# Patient Record
Sex: Male | Born: 1990 | Race: Black or African American | Hispanic: No | Marital: Single | Smoking: Current every day smoker
Health system: Southern US, Community
[De-identification: ages and names within clinical notes are randomized; demographics above are authoritative.]

## PROBLEM LIST (undated history)

## (undated) ENCOUNTER — Ambulatory Visit

## (undated) DIAGNOSIS — R569 Unspecified convulsions: Secondary | ICD-10-CM

## (undated) DIAGNOSIS — F329 Major depressive disorder, single episode, unspecified: Secondary | ICD-10-CM

## (undated) DIAGNOSIS — F1210 Cannabis abuse, uncomplicated: Secondary | ICD-10-CM

## (undated) DIAGNOSIS — F419 Anxiety disorder, unspecified: Secondary | ICD-10-CM

## (undated) DIAGNOSIS — F32A Depression, unspecified: Secondary | ICD-10-CM

## (undated) DIAGNOSIS — R51 Headache: Secondary | ICD-10-CM

## (undated) DIAGNOSIS — G894 Chronic pain syndrome: Secondary | ICD-10-CM

## (undated) DIAGNOSIS — F319 Bipolar disorder, unspecified: Secondary | ICD-10-CM

## (undated) DIAGNOSIS — A539 Syphilis, unspecified: Secondary | ICD-10-CM

## (undated) DIAGNOSIS — I10 Essential (primary) hypertension: Secondary | ICD-10-CM

## (undated) DIAGNOSIS — B1920 Unspecified viral hepatitis C without hepatic coma: Secondary | ICD-10-CM

## (undated) DIAGNOSIS — A549 Gonococcal infection, unspecified: Secondary | ICD-10-CM

## (undated) DIAGNOSIS — F909 Attention-deficit hyperactivity disorder, unspecified type: Secondary | ICD-10-CM

## (undated) DIAGNOSIS — R519 Headache, unspecified: Secondary | ICD-10-CM

## (undated) DIAGNOSIS — J42 Unspecified chronic bronchitis: Secondary | ICD-10-CM

## (undated) DIAGNOSIS — J45909 Unspecified asthma, uncomplicated: Secondary | ICD-10-CM

## (undated) DIAGNOSIS — B20 Human immunodeficiency virus [HIV] disease: Secondary | ICD-10-CM

## (undated) DIAGNOSIS — K648 Other hemorrhoids: Secondary | ICD-10-CM

## (undated) DIAGNOSIS — K5289 Other specified noninfective gastroenteritis and colitis: Secondary | ICD-10-CM

## (undated) DIAGNOSIS — Z21 Asymptomatic human immunodeficiency virus [HIV] infection status: Secondary | ICD-10-CM

## (undated) DIAGNOSIS — R599 Enlarged lymph nodes, unspecified: Secondary | ICD-10-CM

## (undated) DIAGNOSIS — F445 Conversion disorder with seizures or convulsions: Secondary | ICD-10-CM

## (undated) DIAGNOSIS — G43909 Migraine, unspecified, not intractable, without status migrainosus: Secondary | ICD-10-CM

## (undated) DIAGNOSIS — B009 Herpesviral infection, unspecified: Secondary | ICD-10-CM

## (undated) DIAGNOSIS — A630 Anogenital (venereal) warts: Secondary | ICD-10-CM

## (undated) HISTORY — DX: Conversion disorder with seizures or convulsions: F44.5

## (undated) HISTORY — DX: Attention-deficit hyperactivity disorder, unspecified type: F90.9

## (undated) HISTORY — DX: Anxiety disorder, unspecified: F41.9

## (undated) HISTORY — DX: Asymptomatic human immunodeficiency virus (hiv) infection status: Z21

## (undated) HISTORY — DX: Human immunodeficiency virus (HIV) disease: B20

## (undated) HISTORY — DX: Chronic pain syndrome: G89.4

## (undated) HISTORY — DX: Cannabis abuse, uncomplicated: F12.10

## (undated) HISTORY — DX: Anogenital (venereal) warts: A63.0

## (undated) HISTORY — DX: Enlarged lymph nodes, unspecified: R59.9

## (undated) HISTORY — DX: Gonococcal infection, unspecified: A54.9

## (undated) HISTORY — DX: Unspecified convulsions: R56.9

## (undated) SURGERY — IRRIGATION AND DEBRIDEMENT EXTREMITY
Anesthesia: General | Laterality: Right

---
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
## 2011-01-01 LAB — COMPREHENSIVE METABOLIC PANEL
ALT: 14 U/L (ref 0–53)
ALT: 18 U/L (ref 0–53)
AST: 16 U/L (ref 0–37)
AST: 19 U/L (ref 0–37)
AST: 19 U/L (ref 0–37)
Albumin: 3.2 g/dL — ABNORMAL LOW (ref 3.5–5.2)
Albumin: 3.4 g/dL — ABNORMAL LOW (ref 3.5–5.2)
CO2: 28 mEq/L (ref 19–32)
CO2: 28 mEq/L (ref 19–32)
Calcium: 8.5 mg/dL (ref 8.4–10.5)
Calcium: 8.7 mg/dL (ref 8.4–10.5)
Chloride: 105 mEq/L (ref 96–112)
Creatinine, Ser: 0.91 mg/dL (ref 0.4–1.5)
Creatinine, Ser: 1.01 mg/dL (ref 0.4–1.5)
GFR calc Af Amer: >60 mL/min (ref >60)
GFR calc Af Amer: >60 mL/min (ref >60)
GFR calc Af Amer: >60 mL/min (ref >60)
GFR calc non Af Amer: >60 mL/min (ref >60)
GFR calc non Af Amer: >60 mL/min (ref >60)
Glucose, Bld: 80 mg/dL (ref 70–99)
Sodium: 140 mEq/L (ref 135–145)
Sodium: 140 mEq/L (ref 135–145)
Total Bilirubin: 0.7 mg/dL (ref 0.3–1.2)
Total Protein: 5.9 g/dL — ABNORMAL LOW (ref 6.0–8.3)

## 2011-01-01 LAB — DIFFERENTIAL
Basophils Absolute: 0 10*3/uL (ref 0.0–0.1)
Basophils Relative: 0 % (ref 0–1)
Eosinophils Absolute: 0.1 10*3/uL (ref 0.0–0.7)
Eosinophils Relative: 1 % (ref 0–5)
Eosinophils Relative: 2 % (ref 0–5)
Lymphocytes Relative: 23 % (ref 12–46)
Lymphs Abs: 1.7 10*3/uL (ref 0.7–4.0)
Neutrophils Relative %: 67 % (ref 43–77)

## 2011-01-01 LAB — CBC
HCT: 43.8 % (ref 39.0–52.0)
Hemoglobin: 13.8 g/dL (ref 13.0–17.0)
Hemoglobin: 15 g/dL (ref 13.0–17.0)
MCH: 28.7 pg (ref 26.0–34.0)
MCH: 28.7 pg (ref 26.0–34.0)
MCHC: 33.8 g/dL (ref 30.0–36.0)
MCHC: 34.1 g/dL (ref 30.0–36.0)
Platelets: 247 10*3/uL (ref 150–400)
Platelets: 266 10*3/uL (ref 150–400)
RBC: 5.22 MIL/uL (ref 4.22–5.81)
RDW: 13.9 % (ref 11.5–15.5)
RDW: 14.4 % (ref 11.5–15.5)

## 2011-01-01 LAB — URINALYSIS, ROUTINE W REFLEX MICROSCOPIC
Bilirubin Urine: NEGATIVE
Ketones, ur: NEGATIVE mg/dL
Nitrite: NEGATIVE
Protein, ur: NEGATIVE mg/dL
Urobilinogen, UA: 1 mg/dL (ref 0.0–1.0)

## 2011-01-01 LAB — TSH: TSH: 0.667 u[IU]/mL (ref 0.350–4.500)

## 2011-01-01 LAB — LIPASE, BLOOD
Lipase: 17 U/L (ref 11–59)
Lipase: 19 U/L (ref 11–59)

## 2011-01-01 LAB — FOLATE RBC: RBC Folate: 583 ng/mL (ref 180–600)

## 2011-01-05 LAB — CBC
HCT: 45.2 % (ref 36.0–49.0)
HCT: 45.2 % (ref 36.0–49.0)
Hemoglobin: 15.4 g/dL (ref 12.0–16.0)
Hemoglobin: 15.4 g/dL (ref 12.0–16.0)
MCHC: 34 g/dL (ref 31.0–37.0)
MCHC: 34.2 g/dL (ref 31.0–37.0)
MCV: 86.6 fL (ref 78.0–98.0)
MCV: 87 fL (ref 78.0–98.0)
Platelets: 248 10*3/uL (ref 150–400)
Platelets: 254 10*3/uL (ref 150–400)
RBC: 5.22 MIL/uL (ref 3.80–5.70)
RDW: 13.4 % (ref 11.4–15.5)
RDW: 13.6 % (ref 11.4–15.5)
WBC: 13.5 10*3/uL (ref 4.5–13.5)

## 2011-01-05 LAB — COMPREHENSIVE METABOLIC PANEL
ALT: 19 U/L (ref 0–53)
AST: 31 U/L (ref 0–37)
Albumin: 3.7 g/dL (ref 3.5–5.2)
Albumin: 4 g/dL (ref 3.5–5.2)
Alkaline Phosphatase: 93 U/L (ref 52–171)
BUN: 11 mg/dL (ref 6–23)
BUN: 12 mg/dL (ref 6–23)
CO2: 27 mEq/L (ref 19–32)
Calcium: 8.8 mg/dL (ref 8.4–10.5)
Calcium: 9 mg/dL (ref 8.4–10.5)
Chloride: 103 mEq/L (ref 96–112)
Creatinine, Ser: 1.59 mg/dL — ABNORMAL HIGH (ref 0.4–1.5)
Creatinine, Ser: 2.46 mg/dL — ABNORMAL HIGH (ref 0.4–1.5)
Glucose, Bld: 89 mg/dL (ref 70–99)
Glucose, Bld: 89 mg/dL (ref 70–99)
Potassium: 3.6 mEq/L (ref 3.5–5.1)
Potassium: 3.7 mEq/L (ref 3.5–5.1)
Sodium: 139 mEq/L (ref 135–145)
Total Bilirubin: 1.2 mg/dL (ref 0.3–1.2)
Total Protein: 6.8 g/dL (ref 6.0–8.3)
Total Protein: 7.4 g/dL (ref 6.0–8.3)

## 2011-01-05 LAB — URINE MICROSCOPIC-ADD ON

## 2011-01-05 LAB — URINALYSIS, ROUTINE W REFLEX MICROSCOPIC
Bilirubin Urine: NEGATIVE
Glucose, UA: NEGATIVE mg/dL
Glucose, UA: NEGATIVE mg/dL
Ketones, ur: NEGATIVE mg/dL
Leukocytes, UA: NEGATIVE
Leukocytes, UA: NEGATIVE
Nitrite: NEGATIVE
Protein, ur: 30 mg/dL — AB
Protein, ur: >300 mg/dL — AB
Specific Gravity, Urine: 1.01 (ref 1.005–1.030)
Specific Gravity, Urine: 1.01 (ref 1.005–1.030)
Urobilinogen, UA: 0.2 mg/dL (ref 0.0–1.0)
pH: 6 (ref 5.0–8.0)

## 2011-01-05 LAB — DIFFERENTIAL
Basophils Absolute: 0 10*3/uL (ref 0.0–0.1)
Basophils Relative: 0 % (ref 0–1)
Basophils Relative: 0 % (ref 0–1)
Eosinophils Absolute: 0.2 10*3/uL (ref 0.0–1.2)
Eosinophils Relative: 1 % (ref 0–5)
Lymphocytes Relative: 10 % — ABNORMAL LOW (ref 24–48)
Lymphocytes Relative: 6 % — ABNORMAL LOW (ref 24–48)
Lymphs Abs: 0.7 10*3/uL — ABNORMAL LOW (ref 1.1–4.8)
Lymphs Abs: 1.3 10*3/uL (ref 1.1–4.8)
Monocytes Absolute: 0.5 10*3/uL (ref 0.2–1.2)
Monocytes Absolute: 1.2 10*3/uL (ref 0.2–1.2)
Monocytes Relative: 4 % (ref 3–11)
Monocytes Relative: 9 % (ref 3–11)
Neutro Abs: 10.8 10*3/uL — ABNORMAL HIGH (ref 1.7–8.0)
Neutro Abs: 10.8 10*3/uL — ABNORMAL HIGH (ref 1.7–8.0)
Neutrophils Relative %: 80 % — ABNORMAL HIGH (ref 43–71)
Neutrophils Relative %: 88 % — ABNORMAL HIGH (ref 43–71)

## 2011-01-07 LAB — RAPID URINE DRUG SCREEN, HOSP PERFORMED
Amphetamines: NOT DETECTED
Barbiturates: NOT DETECTED
Benzodiazepines: NOT DETECTED
Cocaine: NOT DETECTED
Opiates: NOT DETECTED
Tetrahydrocannabinol: NOT DETECTED

## 2011-01-24 LAB — POCT I-STAT, CHEM 8
Calcium, Ion: 1.19 mmol/L (ref 1.12–1.32)
Glucose, Bld: 99 mg/dL (ref 70–99)
HCT: 47 % (ref 36.0–49.0)
Hemoglobin: 16 g/dL (ref 12.0–16.0)
Potassium: 3.8 mEq/L (ref 3.5–5.1)
TCO2: 24 mmol/L (ref 0–100)

## 2011-01-25 ENCOUNTER — Emergency Department (HOSPITAL_COMMUNITY)
Admission: EM | Admit: 2011-01-25 | Discharge: 2011-01-25 | Discharge status: Against Medical Advice | Payer: Medicaid Other | Attending: Emergency Medicine | Admitting: Emergency Medicine

## 2011-01-25 DIAGNOSIS — R21 Rash and other nonspecific skin eruption: Secondary | ICD-10-CM | POA: Insufficient documentation

## 2011-01-25 DIAGNOSIS — R109 Unspecified abdominal pain: Secondary | ICD-10-CM | POA: Insufficient documentation

## 2011-01-25 DIAGNOSIS — K5090 Crohn's disease, unspecified, without complications: Secondary | ICD-10-CM | POA: Insufficient documentation

## 2011-01-25 DIAGNOSIS — R6889 Other general symptoms and signs: Secondary | ICD-10-CM | POA: Insufficient documentation

## 2011-01-31 ENCOUNTER — Other Ambulatory Visit: Payer: Self-pay | Admitting: Nurse Practitioner

## 2011-01-31 DIAGNOSIS — K219 Gastro-esophageal reflux disease without esophagitis: Secondary | ICD-10-CM

## 2011-03-03 NOTE — Discharge Summary (Signed)
Pedro Owens, Pedro Owens NO.:  [id number]   MEDICAL RECORD NO.:  [id number]          PATIENT TYPE:  INP   LOCATION:  6116                         FACILITY:  [facility]   PHYSICIAN:  Deanna Artis. 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   PROCEDURES:  Prolonged video telemetry EEG over 36 hours duration.   HOSPITAL COURSE:  The patient was admitted with recurrent generalized  seizure-like activity and had been to the emergency room on 5 different  occasions.  He has longstanding problems with anger management and had  to be removed from regular school and placed in a special school earlier  this month.  He is followed by [facility].  He has had other somatic  problems including chronic headaches (seen previously in my office) and  chest pain, more recent finding.   The patient has had poor school performance.   In hospital, the patient with his family was jovial and seemed to be  enjoying himself watching TV and videos.  When health care workers come  into the room, he would speak only when spoken to and if at that time.   The patient had 4 push button events, all of which failed to demonstrate  evidence of seizures.  They were associated with either side-to-side  movement of his head and or rocking of his head back and forth.  In one,  he appeared to have unreactive pupils, be unresponsive to ammonia, and  to have a copious secretions that required suctioning.  This too failed  to show evidence of seizure activity.  Either abnormal waking record  could be seen at times when he was not moving or immediately before  immediately after the event.   There is no evidence of spike and slow wave discharge in the 25 hours  that I have reviewed so for.  There is another 17 hours of recording  that needs to  be evaluated, but the patient has had no further clinical  episodes since he was seen yesterday around 6 p.m.   Examination today, temperature 36.4, blood pressure 99/54, resting pulse  52, respirations 16, and oxygen saturation 99% on room air.  He was  poorly cooperative, but there is no change in his general and neurologic  examination.   PLAN:  Remainder of his EEG record will be reviewed.  He will be  discharged home on Depakote 500 mg as prescribed by his psychiatrist.  He will not be placed on any other antiepileptic medicines.  I have  spoken with his grandfather and his mother to convey my opinions.  I  will try to speak with his psychiatrist later today.  Pedro Owens is very  disturbed at this time and has said that he has dreams that someone will  kill him in his sleep.  He is reticent to talk to this with his parents  because he is afraid that it will upset them.  He needs ongoing  intensive care at [facility].  Major question is whether with this ideation,  he needs to be kept in the hospital.  I do not see him as a danger to  himself or to others at this time.  I would note, however, that he has  had episodes in the hot tub where he went underneath the water and in  the middle of a road.   Laboratory studies during this hospitalization; prolactin level was 9.3  following an episode.  This is in the normal range.  Phenytoin 18.8,  valproic acid 13.4 (this suggested starting with a low dose), hemoglobin  15.3, hematocrit 45.0, ionized calcium 1.17.  Sodium 140, potassium 4.0,  chloride 106, glucose 83, BUN 16, and creatinine 0.9.       Deanna Artis. Sharene Skeans, M.D.  Electronically Signed     [facility]/MEDQ  D:  02/22/2008  T:  02/22/2008  Job:  [id number]

## 2011-03-03 NOTE — Discharge Summary (Signed)
NAMENATHON, STEFANSKI NO.:  [id number]   MEDICAL RECORD NO.:  [id number]          PATIENT TYPE:  OBV   LOCATION:  6153                         FACILITY:  [facility]   PHYSICIAN:  Orie Rout, M.D.DATE OF BIRTH:  11/24/1990   DATE OF ADMISSION:  05/03/2008  DATE OF DISCHARGE:  05/04/2008                               DISCHARGE SUMMARY   SIGNIFICANT FINDINGS:  This is a 20 year old male who comes in with  pseudoseizure.  He has had previous hospitalizations for pseudoseizures  in the past.  His neurological exam was normal.  He had a CBC and a  complete metabolic panel done where the results were all normal.   TREATMENT:  None.   OPERATIONS AND PROCEDURES:  None.   DISCHARGE DIAGNOSIS:  Pseudoseizure.   DISCHARGE MEDICATIONS:  None.   PENDING RESULTS:  None.   FOLLOWUP:  None.   DISCHARGE WEIGHT:  Uncalculated.   DISCHARGE CONDITION:  Stable.      Jamie Brookes, MD  Electronically Signed      Orie Rout, M.D.  Electronically Signed    AS/MEDQ  D:  05/04/2008  T:  05/05/2008  Job:  [id number]

## 2011-03-03 NOTE — Procedures (Signed)
EEG NUMBER:  [id number].   CLINICAL HISTORY:  The patient is a 20 year old with new onset of  seizure activity.  Seizures were noted be generalized, lasting 2-3  minutes.  The study is being done to look for the presence of epilepsy  (780.39).   PROCEDURE:  Tracing is carried out on a 32-channel digital Cadwell  recorder reformatted into 16-channel montages with 1 devoted to EKG.  The patient was awake and drowsy during the recording.   MEDICATIONS:  Include:  1. Catapres.  2. Cerebyx.  3. Ativan.  4. Clonidine.   The International 10/20 system of lead placement was used.   DESCRIPTION OF FINDINGS:  Dominant frequency is a 9 Hz 20 microvolt  activity that is broadly distributed with superimposed under 10  microvolt frontally predominant beta range activity.  The patient  becomes drowsy with 4-5 Hz theta range activity and occasional spindle-  like activity around the central regions but does not truly drift into  natural sleep.   The patient has a 35-second episode of nonepileptic behavior during  which time he is gasping and shaking his upper body.  This causes  movement artifact of lead which as soon as the activity stops the normal  background is present.  The patient is awake and alert following the  event and is able to answer questions appropriately.   EKG showed a regular sinus rhythm with ventricular response of 60 beats  per minute.  Photic stimulation induced a driving response at 9, 11, and  13 Hz.  Hyperventilation caused little change in background.   IMPRESSION:  Normal waking record.  The patient had a 35-second  nonepileptic event associated with gasping and shaking of the body.     Deanna Artis. Sharene Skeans, M.D.  Electronically Signed    EAV:WUJW  D:  01/30/2008 11:33:52  T:  01/30/2008 12:14:54  Job #:  [id number]   cc:   Henrietta Hoover, MD

## 2011-03-03 NOTE — H&P (Signed)
Pedro Owens, Pedro Owens NO.:  [id number]   MEDICAL RECORD NO.:  [id number]          PATIENT TYPE:  INP   LOCATION:  6116                         FACILITY:  [facility]   PHYSICIAN:  Deanna Artis. Hickling, M.D.DATE OF BIRTH:  1991/09/01   DATE OF ADMISSION:  02/20/2008  DATE OF DISCHARGE:                              HISTORY & PHYSICAL   CHIEF COMPLAINT:  Seizure-like behaviors.   HISTORY OF PRESENT ILLNESS:  Rhonda is a 20 year old with explosive  onset of seizures that began in April 2009.  Over the past several  weeks, the patient has had 5 or 6 visits to the emergency room.  His  seizures have occurred in various situations such as being hot tub,  crossing a street, at school, and in the car.   After being seen in the office for consultation on Feb 17, 2008,  the  patient had an episode in the car that his mother was able to video  tape.  This showed him in a  opisthotonic posture, groaning, and  grunting.  I could not see his arms and legs but mother said that he was  jerking.  Without seeing more than the head, cannot comment on whether  not this was entirely consistent with a seizure disorder.  I did see one  hand crossed across his chest.   The patient has had a number of other problems including chest pain.  He  has a history of chronic headaches that occur twice a week, that are  dull and aching, that associate with sensitive light, sound, and  movement.  He has had problems with attention deficit disorder.  He had  a cluster of medications, Strattera and another  neurostimulant that  were discontinued when he had onset of his seizures.  He has had  Depakote recently started because of his anger.   DRUG ALLERGIES:  None.   PAST MEDICAL HISTORY:  As noted above.   REVIEW OF SYSTEMS:  Twelve-system review is negative except as noted  above in the history of present illness.   FAMILY HISTORY:  Negative for seizures, mental retardation, cerebral  palsy,  blindness, deafness, is positive for spina bifida in a cousin and  no history of autism.  His sister has depression, and bipolar affective  disorder.  Mother has depression.  Father has anger management problems.  Brother has attention deficit disorder.   SOCIAL HISTORY:  The patient is a Medical sales representative in high school.  He  switched from [facility] to [facility] because of his anger.   His medications on visit to our office were Depakote 500 mg daily, and  Ativan 0.5 mg twice daily.  First, it was started by his psychiatrist  and the second by his primary physician, Dr. Alwyn Pea.   PHYSICAL EXAMINATION:  GENERAL:  On examination today, this is a well-  developed, well-nourished young man in no acute distress.  VITAL SIGNS:  Temperature 36.7, blood pressure 110/58, resting pulse 60,  respirations 20, oxygen saturation 98%.  EAR, NOSE, AND THROAT:  No infections.  LUNGS:  Clear.  HEART:  No murmurs.  Pulses normal.  ABDOMEN:  Soft.  Bowel sounds normal.  EXTREMITIES:  Unremarkable.  NEUROLOGIC:  Examination of mental status, the patient had a flat  affect, normal language, and cognition.  Cranial nerves, round reactive  pupils.  Visual fields full.  Extraocular movements full.  Symmetric  facial strength.  Midline tongue and uvula.  Air conduction greater than  bone conduction bilaterally.  Motor examination, normal strength, tone,  and mass.  Good fine motor movements.  No pronator drift.  Sensation  intact to vibration and stereognosis.  Gait was normal.  Deep tendon  reflex is diminished.  The patient had bilateral flexor plantar  responses.   IMPRESSION:  1. Seizures versus nonepileptic seizures 780.39.  2. Affective disorder.  I do not know if he is bipolar.  3. Attention deficit disorder, mixed type.  4. Poor school performance.  5. History of chest pain.   PLAN:  24-hour video-EEG.  If his first 24 hours shows no seizures, I  will discontinue Dilantin.  I did not  restart the Depakote or the  Ativan.  We will place a saline lock, so we can give him medication if  we need to do so.  He will have a prolactin level checked if he has  seizures and will also use ammonia underneath his nose to see his  response.   I appreciate the opportunity to participate in his care.  I will review  the video-EEG on a daily basis when it comes ready.      Deanna Artis. Sharene Skeans, M.D.  Electronically Signed     [facility]/MEDQ  D:  02/21/2008  T:  02/21/2008  Job:  [id number]   cc:   Deanna Artis. Sharene Skeans, M.D.  Tanya D. Daphine Deutscher, M.D.  Dr. Elmarie Mainland

## 2011-03-03 NOTE — H&P (Signed)
NAME:  Pedro Owens, Pedro Owens NO.:  [id number]   MEDICAL RECORD NO.:  [id number]          PATIENT TYPE:  INP   LOCATION:  6151                         FACILITY:  [facility]   PHYSICIAN:  Orie Rout, M.D.DATE OF BIRTH:  [birth year]   DATE OF ADMISSION:  01/28/2008  DATE OF DISCHARGE:  01/30/2008                              HISTORY & PHYSICAL   REASON FOR HOSPITALIZATION:  The patient is a 20 year old African  American male with a history of Attention deficit disorder and anger  management issues who  presented to the ED for the second time with a  history for a new onset of seizures.  The patient had 4 witnessed  seizures in the past 24 hours.  The patient did not have any urinary  or fecal incontinence during the episodes, and there was no postictal  state.   SIGNIFICANT FINDINGS:  Physical exam showed no focal neurological deficits.  A Dilantin level  was 26.  He did have an MRI/MRA that were negative.  Urine drug screen  was negative.  Chemistry panel was within normal limits.  His CBC was  within normal limits.  EKG showed sinus bradycardia with the rate of 48.  Head CT was negative for any lesion.  His alcohol level was negative,  and the patient was afebrile during hospitalization.  He did report one  seizure-like episode during the EEG reading.   TREATMENT:  The patient was treated with fosphenytoin 20 mg/kg load 1  time.  He also was on a CR monitor and was written for Ativan p.r.n.  seizure activity.  He did not receive any Ativan.  The patient underwent  EEG, the final results are pending.   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   DISCHARGE MEDICATIONS AND INSTRUCTIONS:  The patient is to return if he  becomes unstable, if he has a fever greater than 100.4 degrees or he is  also instructed to return if he experiences decreased urine output or  loss of appetite.   PENDING RESULTS AND ISSUES TO BE FOLLOWED:  We will follow up on  his  final EEG results, and they are currently pending at this time.   FOLLOWUP:  A followup appointment with Dr. Daphine Deutscher at the [facility]  [facility] for February 06, 2008, at 3:30 p.m.   DISCHARGE WEIGHT:  69 kg.   DISCHARGE CONDITION:  Stable.   Information faxed to primary care physician, Dr. Daphine Deutscher in [facility], fax number [phone] and also fax to Dr. Elsie Saas at [facility], the patient's primary psychiatrist, fax number [phone].      Pediatrics Resident      Orie Rout, M.D.  Electronically Signed    PR/MEDQ  D:  01/30/2008  T:  01/31/2008  Job:  [id number]

## 2011-03-03 NOTE — Procedures (Signed)
EEG NUMBER:  [id number].   CLINICAL HISTORY:  The patient is a 20 year old [place] American young  man with explosive onset of seizure-like behavior that is generalized  that began in April 2009.  The patient has had a chronic headache  disorder, chest pain, and has had longstanding history of emotional  disorder followed by [facility] [employer].  The study is being done to look for  the presence of a seizure disorder (780.39).   PROCEDURE:  The tracing is carried out on a 32-channel digital Cadwell  recorder reformatted into 16-channel montages with one devoted to EKG.  The patient was awake and asleep during the recording.  The  International 10/20 system lead placement was used.  Medications include  Dilantin.  The patient had been on Depakote.  This was stopped prior to  the admission.   DESCRIPTION OF FINDINGS:  The record was carried out beginning on January 21, 2008, at 1324 hours and 27 seconds and continued until Feb 21, 2008,  at 1547 hours and 42 seconds.  This is a total of approximately 26 hours  and 18 minutes.  The record continues at this time.   During the first 18 hours and 42 minutes of recording, the patient had  no events.  Dilantin was held after the first day, although he was not  scheduled to have any doses of Dilantin until later.  Beginning at  8:26:20 and extending until 8:27:12, the patient had rhythmic side-to-  side movements of his head.  There was evidence of electrode artifact  and through it a 10 Hz, 15 microvolt alpha range activity was seen.  The  activity was present before and after the behavior.  The behavior  stopped abruptly once the patient was examined by the nurse who shined  the light in his eyes.   Second episode happened at 8:35 and 39 seconds and continued until 8:25  and 58 seconds on Feb 21, 2008.  He had rhythmic nodding of his head up  and down.  This happened singly and then rhythmically there were a  series of push-button events.  Throughout  this portion, the patient had  electrode artifact and again a normal background record before, after,  and at brief periods during.   At 8:36 and 14 seconds, the patient had the same behavior.  He responded  to ammonia wafted under his nose and immediately returning to waking  record.  At 9:19 and 39 seconds, he had brief rocking of his head again  with electrode artifact, but no change in background activity.  Finally  at 9:49:35, the patient went from waking record into head nodding.  He  had saliva that came from his mouth.  He did not respond to ammonia.  His pupils were nonreactive, but the record showed a waking record  surrounded by electrode artifact during the behavior and concluding with  a waking record with the patient appeared behaviorally unarousable.   The background showed a dominant frequency of 9 Hz, 20 microvolt  activity with frontally predominant beta range activity.  During  drowsiness, mixed frequency of theta and delta range activity was seen.  During sleep, vertex sharp waves and symmetric and synchronous sleep  spindles and during sleep high-voltage delta range activity.   There was no focal slowing.  There was no interictal epileptiform  activity in the form of spikes or sharp waves.   EKG showed regular sinus rhythm with ventricular response of 72 beats  per minute.  IMPRESSION:  This record demonstrates at least four episodes of behavior  with a normal background either seen during or immediately before and  after the activity.  This is consistent wih psychogenic non-epileptic  seizures.      Deanna Artis. Sharene Skeans, M.D.  Electronically Signed     [id number]  D:  02/21/2008 21:09:35  T:  02/22/2008 03:55:22  Job #:  [id number]

## 2011-03-03 NOTE — Procedures (Signed)
EEG NUMBER:  [id number].   This is the third part of 3 records that were carried out between Feb 21, 2008 and Feb 22, 2008, covering a 16-hour 52 minute 51 second period  ending at 8:33:32.   CLINICAL HISTORY:  A 20 year old with history of seizures versus  nonepileptic seizures.  Prior to this, there were 4 events that were  nonepileptic in nature. (780.39)   PROCEDURE:  The tracing is carried out on a 32-channel digital Cadwell  recorder reformatted into 16-channel montages with one devoted to EKG.  The patient was awake during the recording,  drowsy and asleep.  The  International 10-20 system of lead placement was used.  He takes no  medications.   DESCRIPTION OF FINDINGS:  Dominant frequency is 9-10 Hz, 25-30 mcV  activity with background of 110 mcV beta range activity.  The patient  shows drowsiness with mixed frequency theta and delta range activity and  drifts into natural sleep with vertex sharp waves, symmetric, and  synchronous sleep spindles in the delta range background.  He drifts  into deep sleep with predominately polymorphic delta range activity  without spindles.  The patient has both eye blink and chewing artifact.   The patient had a series of 4 or 5 discrete behaviors beginning at  7:36:20 on Feb 22, 2008.  He had rocking movements of his head side-to-  side intermittently.  This created artifact but normal background could  be seen in leads on the left side of the brain.  The patient was lying  on the right side of his head.  Normal background could also be seen  when he would briefly stop.  This continued until 7:39 and then stopped.  He then had brief episodes at 7:40, increasing activity at 7:41, 7:42  and had both rocking and nodding movements and thrusting of his body.  Around 7:43:40 this stopped.  He had a normal waking background even  though he appeared to be asleep.  There was some saliva that came from  his mouth, which was suctioned.   At 8:18:20, he  had a 40-second episode again of rolling of his head side-  to-side and at 8:22:40, less than a 10-second episode of rocking of his  body with some thrusting and finally an episode at 8:29:30 to 8:29:10  with rocking and rolling of his head from side-to-side and thrusting of  his body.  All of these were associated with normal background before  and after and some normal background that could seen during.   IMPRESSION:  This is a normal EEG in the waking state, drowsiness, and  natural sleep.  The multiple episodes seen are behaviors that do not  show any correlation with disturbance of brain background other than  lead artifact.  They are consistent with psychogenic nonepileptic  seizures.      Deanna Artis. Sharene Skeans, M.D.  Electronically Signed     ZOX:[id number]  D:  02/23/2008 12:30:48  T:  02/24/2008 04:42:04  Job #:  [id number]

## 2011-03-18 ENCOUNTER — Other Ambulatory Visit: Payer: Self-pay | Admitting: Nurse Practitioner

## 2011-03-19 ENCOUNTER — Other Ambulatory Visit: Payer: Self-pay | Admitting: *Deleted

## 2011-03-19 ENCOUNTER — Other Ambulatory Visit: Payer: Self-pay | Admitting: Nurse Practitioner

## 2011-03-19 NOTE — Telephone Encounter (Signed)
I had spoken to the pharmacist today to advise we are not approving the refill on the prednisone.  The pt does need to make an appointment with Dr. Lina Sar first. She can discuss the medication situation.

## 2011-03-19 NOTE — Telephone Encounter (Signed)
I called the [facility] pharmacy and advised them we are denying the pt the Prednisone.  He is to make an appointment to see Dr. Lina Sar first, before we approve more prednisone.

## 2011-03-24 ENCOUNTER — Other Ambulatory Visit: Payer: Self-pay | Admitting: *Deleted

## 2011-03-24 NOTE — Telephone Encounter (Signed)
Pharmacy sent request for Prednisone.  Per Gunnar Fusi, the patient needs to call and make appointment with Dr. Juanda Chance.

## 2011-04-16 ENCOUNTER — Emergency Department (HOSPITAL_COMMUNITY)
Admission: EM | Admit: 2011-04-16 | Discharge: 2011-04-16 | Discharge status: Against Medical Advice | Payer: Medicaid Other | Attending: Emergency Medicine | Admitting: Emergency Medicine

## 2011-04-16 DIAGNOSIS — R21 Rash and other nonspecific skin eruption: Secondary | ICD-10-CM | POA: Insufficient documentation

## 2011-04-17 ENCOUNTER — Emergency Department (HOSPITAL_COMMUNITY)
Admission: EM | Admit: 2011-04-17 | Discharge: 2011-04-17 | Disposition: A | Discharge status: Home / Self Care | Payer: Medicaid Other | Attending: Emergency Medicine | Admitting: Emergency Medicine

## 2011-04-17 DIAGNOSIS — K5090 Crohn's disease, unspecified, without complications: Secondary | ICD-10-CM | POA: Insufficient documentation

## 2011-04-17 DIAGNOSIS — G8929 Other chronic pain: Secondary | ICD-10-CM | POA: Insufficient documentation

## 2011-04-17 DIAGNOSIS — R21 Rash and other nonspecific skin eruption: Secondary | ICD-10-CM | POA: Insufficient documentation

## 2011-04-17 DIAGNOSIS — R109 Unspecified abdominal pain: Secondary | ICD-10-CM | POA: Insufficient documentation

## 2011-04-22 ENCOUNTER — Other Ambulatory Visit: Payer: Self-pay | Admitting: Internal Medicine

## 2011-04-30 ENCOUNTER — Ambulatory Visit: Payer: Medicaid Other | Admitting: Internal Medicine

## 2011-07-14 LAB — POCT I-STAT, CHEM 8
BUN: 18
BUN: 19
Calcium, Ion: 1.17
Calcium, Ion: 1.24
Calcium, Ion: 1.15
Chloride: 104
Chloride: 105
Chloride: 106
Chloride: 107
Creatinine, Ser: 1.2
Creatinine, Ser: 0.9
Creatinine, Ser: 0.9
Glucose, Bld: 84
Glucose, Bld: 98
Glucose, Bld: 110 — ABNORMAL HIGH
Glucose, Bld: 83
HCT: 44
HCT: 45 — ABNORMAL HIGH
Hemoglobin: 15 — ABNORMAL HIGH
Potassium: 3.4 — ABNORMAL LOW
Potassium: 3.8
Potassium: 3.6
Potassium: 4
Sodium: 140
TCO2: 24

## 2011-07-14 LAB — COMPREHENSIVE METABOLIC PANEL
ALT: 24
AST: 29
Albumin: 3.8
Calcium: 9.1
Creatinine, Ser: 0.92
Sodium: 139
Total Protein: 6.4

## 2011-07-14 LAB — POCT I-STAT 3, ART BLOOD GAS (G3+)
Acid-Base Excess: 1
Bicarbonate: 24.8 — ABNORMAL HIGH
Operator id: 257001
pCO2 arterial: 36.7
pO2, Arterial: 161 — ABNORMAL HIGH

## 2011-07-14 LAB — DIFFERENTIAL
Basophils Absolute: 0
Basophils Absolute: 0
Basophils Relative: 0
Eosinophils Absolute: 0.1
Eosinophils Relative: 2
Lymphocytes Relative: 21 — ABNORMAL LOW
Lymphocytes Relative: 30 — ABNORMAL LOW
Lymphs Abs: 2.3
Monocytes Absolute: 0.7
Monocytes Absolute: 0.8
Monocytes Relative: 8
Monocytes Relative: 9
Neutro Abs: 4.6
Neutro Abs: 7
Neutrophils Relative %: 59
Neutrophils Relative %: 71 — ABNORMAL HIGH

## 2011-07-14 LAB — RAPID URINE DRUG SCREEN, HOSP PERFORMED
Amphetamines: NOT DETECTED
Amphetamines: NOT DETECTED
Barbiturates: NOT DETECTED
Barbiturates: NOT DETECTED
Benzodiazepines: NOT DETECTED
Benzodiazepines: POSITIVE — AB
Cocaine: NOT DETECTED
Opiates: NOT DETECTED
Tetrahydrocannabinol: NOT DETECTED

## 2011-07-14 LAB — CBC
HCT: 41.7
Hemoglobin: 14.1
Hemoglobin: 14.6
MCHC: 33.9
MCV: 83.8
Platelets: 236
RBC: 4.98
RBC: 5.07
RDW: 14.2
WBC: 7.7
WBC: 9.9

## 2011-07-14 LAB — COMPREHENSIVE METABOLIC PANEL WITH GFR
Alkaline Phosphatase: 157
BUN: 16
CO2: 25
Chloride: 109
Glucose, Bld: 87
Potassium: 3.8
Total Bilirubin: 0.7

## 2011-07-14 LAB — PHENYTOIN LEVEL, FREE AND TOTAL: Phenytoin, Free: 1.96 (ref 1.00–2.00)

## 2011-07-14 LAB — ETHANOL
Alcohol, Ethyl (B): <5
Alcohol, Ethyl (B): <5

## 2011-07-15 LAB — COMPREHENSIVE METABOLIC PANEL
ALT: 22
BUN: 13
CO2: 24
Calcium: 8.7
Creatinine, Ser: 0.86
Glucose, Bld: 111 — ABNORMAL HIGH

## 2011-07-15 LAB — CBC
HCT: 42
Hemoglobin: 14.4
MCHC: 34.3
MCV: 83.7
Platelets: 260
RBC: 5.02
RDW: 14
WBC: 8.8

## 2011-07-15 LAB — URINALYSIS, ROUTINE W REFLEX MICROSCOPIC
Bilirubin Urine: NEGATIVE
Hgb urine dipstick: NEGATIVE
Protein, ur: NEGATIVE
Specific Gravity, Urine: 1.027
Urobilinogen, UA: 1

## 2011-07-15 LAB — DIFFERENTIAL
Basophils Absolute: 0
Basophils Relative: 0
Eosinophils Absolute: 0.1
Eosinophils Relative: 1
Monocytes Absolute: 0.6
Monocytes Relative: 7
Neutro Abs: 6.2

## 2011-07-15 LAB — RAPID URINE DRUG SCREEN, HOSP PERFORMED
Amphetamines: NOT DETECTED
Amphetamines: NOT DETECTED
Cocaine: NOT DETECTED
Opiates: NOT DETECTED
Tetrahydrocannabinol: NOT DETECTED

## 2011-07-15 LAB — ETHANOL

## 2011-07-16 LAB — URINALYSIS, ROUTINE W REFLEX MICROSCOPIC
Glucose, UA: NEGATIVE
Hgb urine dipstick: NEGATIVE
Ketones, ur: NEGATIVE
Nitrite: NEGATIVE
Protein, ur: NEGATIVE
Specific Gravity, Urine: 1.035 — ABNORMAL HIGH
Urobilinogen, UA: 1
pH: 6

## 2011-07-16 LAB — URINE MICROSCOPIC-ADD ON

## 2011-07-16 LAB — RAPID URINE DRUG SCREEN, HOSP PERFORMED
Amphetamines: NOT DETECTED
Barbiturates: NOT DETECTED
Benzodiazepines: POSITIVE — AB
Cocaine: NOT DETECTED
Opiates: NOT DETECTED
Tetrahydrocannabinol: NOT DETECTED

## 2011-07-17 LAB — RAPID URINE DRUG SCREEN, HOSP PERFORMED
Amphetamines: NOT DETECTED
Barbiturates: NOT DETECTED
Benzodiazepines: NOT DETECTED
Cocaine: NOT DETECTED
Opiates: NOT DETECTED
Tetrahydrocannabinol: NOT DETECTED
Tetrahydrocannabinol: NOT DETECTED

## 2011-07-17 LAB — POCT I-STAT, CHEM 8
BUN: 20
Calcium, Ion: 1.12
Calcium, Ion: 1.27
Chloride: 107
Creatinine, Ser: 1.1
Glucose, Bld: 104 — ABNORMAL HIGH
Glucose, Bld: 88
HCT: 45 — ABNORMAL HIGH
HCT: 47
Hemoglobin: 15.3 — ABNORMAL HIGH
Potassium: 3.2 — ABNORMAL LOW
Potassium: 3.9
Sodium: 140
TCO2: 26
TCO2: 30

## 2011-08-11 ENCOUNTER — Inpatient Hospital Stay (INDEPENDENT_AMBULATORY_CARE_PROVIDER_SITE_OTHER)
Admission: RE | Admit: 2011-08-11 | Discharge: 2011-08-11 | Disposition: A | Discharge status: Home / Self Care | Payer: Self-pay | Source: Ambulatory Visit | Attending: Family Medicine | Admitting: Family Medicine

## 2011-08-11 DIAGNOSIS — B078 Other viral warts: Secondary | ICD-10-CM

## 2011-08-25 ENCOUNTER — Encounter (INDEPENDENT_AMBULATORY_CARE_PROVIDER_SITE_OTHER): Payer: Self-pay | Admitting: General Surgery

## 2011-08-25 ENCOUNTER — Ambulatory Visit (INDEPENDENT_AMBULATORY_CARE_PROVIDER_SITE_OTHER): Payer: Medicaid Other | Admitting: General Surgery

## 2011-08-25 DIAGNOSIS — K648 Other hemorrhoids: Secondary | ICD-10-CM | POA: Insufficient documentation

## 2011-08-25 DIAGNOSIS — A630 Anogenital (venereal) warts: Secondary | ICD-10-CM

## 2011-08-25 HISTORY — DX: Other hemorrhoids: K64.8

## 2011-08-25 MED ORDER — IMIQUIMOD 5 % EX CREA: TOPICAL_CREAM | CUTANEOUS | Status: DC

## 2011-08-25 MED ORDER — HYDROCORTISONE 2.5 % RE CREA
TOPICAL_CREAM | RECTAL | Status: DC
Start: 2011-08-25 — End: 2011-09-08

## 2011-08-25 NOTE — Assessment & Plan Note (Signed)
Aldara to warts.   May need fulguration if aldara unsuccessful.

## 2011-08-25 NOTE — Progress Notes (Signed)
Chief Complaint  Patient presents with  . Other    urgent- eval anal pain; rectal bleeding    HISTORY: Pt with 6 months history of perianal bleeding and soreness.  Occasionally feels like something comes out of his bottom.  Denies difficulty with cleaning.  Despite having Crohn's, he is having some issues with constipation and prolonged time on the toilet.  He is otherwise doing well.  He was diagnosed with crohn's in his youth with abdominal pain and bloody diarrhea.    Past Medical History  Diagnosis Date  . Ileitis   . ADHD (attention deficit hyperactivity disorder)   . Lymphoid hyperplasia, reactive   . Anxiety   . Marijuana abuse   . Crohn's disease     Past Surgical History  Procedure Date  . Foot surgery     bilateral  . Ankle surgery     bilateral    Current Outpatient Prescriptions  Medication Sig Dispense Refill  . hydrocortisone (ANUSOL-HC) 2.5 % rectal cream Apply TID after aldara finished.  30 g  0  . imiquimod (ALDARA) 5 % cream Apply to affected area three times weekly  12 each  1     Allergies  Allergen Reactions  . Hydromorphone Hcl     REACTION: rash     Family History  Problem Relation Age of Onset  . Colon cancer Neg Hx      History   Social History  . Marital Status: Single    Spouse Name: N/A    Number of Children: N/A  . Years of Education: N/A   Social History Main Topics  . Smoking status: Current Everyday Smoker -- 1.5 packs/day  . Smokeless tobacco: None  . Alcohol Use: Yes  . Drug Use: No  . Sexually Active: None   Other Topics Concern  . None   Social History Narrative  . None     REVIEW OF SYSTEMS - PERTINENT POSITIVES ONLY: 12 point review of systems negative other than HPI and PMH.  EXAM: Filed Vitals:   08/25/11 1648  BP: 150/104  Pulse: 52  Temp: 97.9 F (36.6 C)  Resp: 16    Gen:  No acute distress.  Well nourished and well groomed.   Neurological: Alert and oriented to person, place, and time.  Coordination normal.  Head: Normocephalic and atraumatic.  Eyes: Conjunctivae are normal. Pupils are equal, round, and reactive to light. No scleral icterus.  Neck: Normal range of motion. Neck supple.  Cardiovascular: Normal rate, regular rhythm. Respiratory: Effort normal.  No respiratory distress.  GI: Soft.The abdomen is soft and nontender.    Rectal:  Numerous anal warts at anal margin and in anal canal.  One large internal hemorrhoid posteriorly that was injected.   Musculoskeletal: Normal range of motion. Extremities are nontender.  Skin: Skin is warm and dry. No rash noted. No diaphoresis. No erythema. No pallor. No clubbing, cyanosis, or edema.   Psychiatric: Normal mood and affect. Behavior is normal. Judgment and thought content normal.    ASSESSMENT AND PLAN:   Hemorrhoids, internal, with bleeding Anusol for hemorrhoids. Stool softeners Laxatives. Treat anal warts.    Anal warts Aldara to warts.   May need fulguration if aldara unsuccessful.      Maudry Diego MD Surgical Oncology, General and Endocrine Surgery [facility]      Visit Diagnoses: 1. Hemorrhoids, internal, with bleeding   2. Anal warts     Primary Care Physician: Myrtie Soman, RN, RN

## 2011-08-25 NOTE — Patient Instructions (Signed)
Use Aldara as directed.  Take stool softeners over the counter twice a day. Take laxatives if still having to strain to have stools. Avoid sitting on the toilet for a prolonged period of time.    Once Aldara is done, use Anusol cream for hemorrhoids.

## 2011-08-25 NOTE — Assessment & Plan Note (Signed)
Anusol for hemorrhoids. Stool softeners Laxatives. Treat anal warts.

## 2011-09-07 ENCOUNTER — Ambulatory Visit: Payer: Self-pay | Admitting: Family Medicine

## 2011-09-08 ENCOUNTER — Emergency Department (HOSPITAL_COMMUNITY)
Admission: EM | Admit: 2011-09-08 | Discharge: 2011-09-08 | Disposition: A | Discharge status: Home / Self Care | Payer: Medicaid Other | Attending: Emergency Medicine | Admitting: Emergency Medicine

## 2011-09-08 ENCOUNTER — Encounter (HOSPITAL_COMMUNITY): Payer: Self-pay

## 2011-09-08 DIAGNOSIS — A64 Unspecified sexually transmitted disease: Secondary | ICD-10-CM

## 2011-09-08 DIAGNOSIS — A549 Gonococcal infection, unspecified: Secondary | ICD-10-CM

## 2011-09-08 DIAGNOSIS — F17200 Nicotine dependence, unspecified, uncomplicated: Secondary | ICD-10-CM | POA: Insufficient documentation

## 2011-09-08 DIAGNOSIS — R51 Headache: Secondary | ICD-10-CM | POA: Insufficient documentation

## 2011-09-08 DIAGNOSIS — R369 Urethral discharge, unspecified: Secondary | ICD-10-CM | POA: Insufficient documentation

## 2011-09-08 HISTORY — DX: Gonococcal infection, unspecified: A54.9

## 2011-09-08 MED ORDER — AZITHROMYCIN 1 G PO PACK
1.0000 g | PACK | ORAL | Status: DC
  Filled 2011-09-08: qty 1

## 2011-09-08 MED ORDER — IBUPROFEN 800 MG PO TABS: 800.0000 mg | ORAL_TABLET | Freq: Three times a day (TID) | ORAL | Status: AC

## 2011-09-08 MED ORDER — KETOROLAC TROMETHAMINE 60 MG/2ML IM SOLN
60.0000 mg | Freq: Once | INTRAMUSCULAR | Status: AC
  Administered 2011-09-08: 60 mg via INTRAMUSCULAR
  Filled 2011-09-08: qty 2

## 2011-09-08 MED ORDER — CEFTRIAXONE SODIUM 250 MG IJ SOLR
250.0000 mg | Freq: Once | INTRAMUSCULAR | Status: AC
  Administered 2011-09-08: 250 mg via INTRAMUSCULAR
  Filled 2011-09-08: qty 250

## 2011-09-08 MED ORDER — ONDANSETRON 4 MG PO TBDP: 8.0000 mg | ORAL_TABLET | Freq: Three times a day (TID) | ORAL | Status: AC | PRN

## 2011-09-08 MED ORDER — AZITHROMYCIN 250 MG PO TABS
ORAL_TABLET | ORAL | Status: AC
  Administered 2011-09-08: 1000 mg
  Filled 2011-09-08: qty 4

## 2011-09-08 NOTE — ED Provider Notes (Signed)
Medical screening examination/treatment/procedure(s) were performed by non-physician practitioner and as supervising physician I was immediately available for consultation/collaboration.   Glynn Octave, MD 09/08/11 [phone]

## 2011-09-08 NOTE — ED Provider Notes (Signed)
History     CSN: [id number] Arrival date & time: 09/08/2011 12:49 PM   First MD Initiated Contact with Patient 09/08/11 1331      Chief Complaint  Patient presents with  . Headache  . Penile Discharge   Patient is a 20 y.o. male presenting with headaches and penile discharge.  Headache  Pertinent negatives include no anorexia, no fever, no malaise/fatigue, no chest pressure, no near-syncope, no orthopnea, no palpitations, no syncope, no shortness of breath, no nausea and no vomiting.  Penile Discharge Associated symptoms include headaches. Pertinent negatives include no anorexia, fever, nausea or vomiting.   Patient reports that that he had unprotected sex 2 days ago. Concern for STD. Penile discharge. No pain. Reports gradual onset headache for the past two days. No other symptoms.  Past Medical History  Diagnosis Date  . Ileitis   . ADHD (attention deficit hyperactivity disorder)   . Lymphoid hyperplasia, reactive   . Anxiety   . Marijuana abuse   . Crohn's disease     Past Surgical History  Procedure Date  . Foot surgery     bilateral  . Ankle surgery     bilateral    Family History  Problem Relation Age of Onset  . Colon cancer Neg Hx     History  Substance Use Topics  . Smoking status: Current Everyday Smoker -- 1.5 packs/day  . Smokeless tobacco: Not on file  . Alcohol Use: Yes      Review of Systems  Constitutional: Negative for fever and malaise/fatigue.  Respiratory: Negative for shortness of breath.   Cardiovascular: Negative for palpitations, orthopnea, syncope and near-syncope.  Gastrointestinal: Negative for nausea, vomiting and anorexia.  Genitourinary: Positive for discharge. Negative for dysuria, urgency, hematuria, flank pain, decreased urine volume, penile swelling, scrotal swelling, difficulty urinating, genital sores, penile pain and testicular pain.  Neurological: Positive for headaches.    Allergies  Dilaudid  Home Medications    Current Outpatient Rx  Name Route Sig Dispense Refill  . PREDNISOLONE 5 MG PO TABS Oral Take by mouth.      Marland Kitchen HYDROCORTISONE 2.5 % RE CREA  Apply TID after aldara finished. 30 g 0  . IMIQUIMOD 5 % EX CREA  Apply to affected area three times weekly 12 each 1    BP 129/72  Pulse 70  Temp(Src) 98.5 F (36.9 C) (Oral)  Resp 18  Wt 173 lb (78.472 kg)  SpO2 99%  Physical Exam  Nursing note and vitals reviewed. Constitutional: He is oriented to person, place, and time. He appears well-developed and well-nourished. No distress.  HENT:  Head: Normocephalic and atraumatic.  Neck: Normal range of motion. Neck supple.  Cardiovascular: Normal rate and regular rhythm.   Abdominal: Soft. Bowel sounds are normal. He exhibits no distension and no mass. There is no tenderness. There is no rebound and no guarding.  Genitourinary: Penis normal. No penile tenderness.       Urethral discharge. No tenderness. No testicular tenderness. No rash.  Neurological: He is alert and oriented to person, place, and time. He has normal reflexes. He displays normal reflexes. No cranial nerve deficit. He exhibits normal muscle tone. Coordination normal.  Skin: Skin is warm and dry. No rash noted. He is not diaphoretic. No erythema. No pallor.  Psychiatric: He has a normal mood and affect. His behavior is normal. Judgment and thought content normal.    ED Course  Procedures (including critical care time)  Patient seen and evaluated.  VSS  reviewed. . Nursing notes reviewed.Initial testing ordered. Will monitor the patient closely. They agree with the treatment plan and diagnosis.   MDM  STD          Demetrius Charity, PA 09/08/11 [phone]

## 2011-09-08 NOTE — ED Notes (Signed)
Pt reports unprotected sex x 1 week ago reports penile discharge x 2 days - "mucus or snot appearance"

## 2011-09-09 LAB — GC/CHLAMYDIA PROBE AMP, GENITAL: GC Probe Amp, Genital: POSITIVE — AB

## 2011-09-10 NOTE — ED Notes (Signed)
Results rcvd from [facility].  (+) Gonorrhea.  Pt treated w/Zithromax 1,000 mg po x 1 dose and Rocephin 250 mg IM 1 x dose.  Call and notify patient.  [employer] form completed and faxed.

## 2011-09-12 NOTE — ED Notes (Signed)
+   Gonorrhea. Patient notified by phone after ID verified x two. STD instructions given. Patient verbalized understanding.

## 2011-09-14 ENCOUNTER — Emergency Department (HOSPITAL_COMMUNITY)
Admission: EM | Admit: 2011-09-14 | Discharge: 2011-09-14 | Discharge status: Against Medical Advice | Payer: Medicaid Other | Attending: Emergency Medicine | Admitting: Emergency Medicine

## 2011-09-14 DIAGNOSIS — R110 Nausea: Secondary | ICD-10-CM | POA: Insufficient documentation

## 2012-04-25 ENCOUNTER — Encounter: Payer: Self-pay | Admitting: *Deleted

## 2012-05-06 ENCOUNTER — Ambulatory Visit: Payer: Medicaid Other | Admitting: Internal Medicine

## 2012-06-23 ENCOUNTER — Telehealth: Payer: Self-pay | Admitting: Internal Medicine

## 2012-06-23 NOTE — Telephone Encounter (Signed)
Message copied by Arna Snipe on Thu Jun 23, 2012  8:45 AM ------      Message from: Richardson Chiquito      Created: Mon May 09, 2012  8:13 AM                   ----- Message -----         From: Hart Carwin, MD         Sent: 05/06/2012  10:00 PM           To: Richardson Chiquito, CMA            Yes please      ----- Message -----         From: Richardson Chiquito, CMA         Sent: 05/06/2012   3:44 PM           To: Hart Carwin, MD            Patient no showed appointment with Dr Juanda Chance on 05/06/12. Dr Juanda Chance, do you want to charge no show fee?

## 2012-07-16 ENCOUNTER — Encounter (HOSPITAL_COMMUNITY): Payer: Self-pay | Admitting: Emergency Medicine

## 2012-07-16 ENCOUNTER — Emergency Department (HOSPITAL_COMMUNITY)
Admission: EM | Admit: 2012-07-16 | Discharge: 2012-07-16 | Disposition: A | Discharge status: Home / Self Care | Payer: Self-pay | Attending: Emergency Medicine | Admitting: Emergency Medicine

## 2012-07-16 DIAGNOSIS — G894 Chronic pain syndrome: Secondary | ICD-10-CM | POA: Insufficient documentation

## 2012-07-16 DIAGNOSIS — F17200 Nicotine dependence, unspecified, uncomplicated: Secondary | ICD-10-CM | POA: Insufficient documentation

## 2012-07-16 DIAGNOSIS — F909 Attention-deficit hyperactivity disorder, unspecified type: Secondary | ICD-10-CM | POA: Insufficient documentation

## 2012-07-16 DIAGNOSIS — R259 Unspecified abnormal involuntary movements: Secondary | ICD-10-CM | POA: Insufficient documentation

## 2012-07-16 DIAGNOSIS — R569 Unspecified convulsions: Secondary | ICD-10-CM

## 2012-07-16 NOTE — ED Notes (Signed)
Pt c/o anxiety and hx of HTN and pseudoseizsures. Pt states, "I don't have epileptic seizures. Most of the time I just sit here and they send me home, but my sister called 911 because she said I stopped breathing. Paramedics arrived and did all the vitals. They said my sugar was 68. I also want to be checked for STDs. I'm 100% sure I have the flu. I want to get checked for that."

## 2012-07-16 NOTE — ED Notes (Signed)
Denies SI/HI

## 2012-07-16 NOTE — ED Notes (Signed)
Bed:[facility]<BR> Expected date:07/16/12<BR> Expected time: 6:50 PM<BR> Means of arrival:Ambulance<BR> Comments:<BR> anxiety

## 2012-07-16 NOTE — ED Provider Notes (Signed)
History     CSN: [id number]  Arrival date & time 07/16/12  4098   First MD Initiated Contact with Patient 07/16/12 2106      Chief Complaint  Patient presents with  . Anxiety    HPI  The patient presents with multiple complaints.  Notably, the patient had a witnessed episode of seizure-like activity, with possible apnea.  This was witnessed by his sister. By the time of my evaluation the patient has been waiting for some time, defers any physical exam, testing.  Discussed the event, return precautions, the patient's desire for discharge and appropriate return precautions.   Past Medical History  Diagnosis Date  . Ileitis   . ADHD (attention deficit hyperactivity disorder)   . Lymphoid hyperplasia, reactive   . Anxiety   . Marijuana abuse   . Gonorrhea 09/08/11  . Anal warts   . Pseudoseizures   . Chronic pain syndrome     Past Surgical History  Procedure Date  . Foot surgery     bilateral  . Ankle surgery     bilateral    Family History  Problem Relation Age of Onset  . Colon cancer Neg Hx     History  Substance Use Topics  . Smoking status: Current Every Day Smoker -- 1.5 packs/day  . Smokeless tobacco: Not on file  . Alcohol Use: Yes      Review of Systems  All other systems reviewed and are negative.    Allergies  Dilaudid  Home Medications   Current Outpatient Rx  Name Route Sig Dispense Refill  . GUAIFENESIN 100 MG/5ML PO LIQD Oral Take 300 mg by mouth 4 (four) times daily as needed. Cough.    . OXYCODONE HCL ER 10 MG PO TB12  Take 1 tablet by mouth three times daily      BP 130/77  Pulse 72  Resp 18  SpO2 98%  Physical Exam  Constitutional: He is oriented to person, place, and time. He appears well-nourished. No distress.       Young male in no distress  HENT:  Head: Normocephalic and atraumatic.  Pulmonary/Chest: No stridor.  Neurological: He is alert and oriented to person, place, and time. No cranial nerve deficit.    Psychiatric: He has a normal mood and affect.    ED Course  Procedures (including critical care time)   Labs Reviewed  GLUCOSE, CAPILLARY   No results found.   1. Seizure-like activity       MDM  This patient presented for seizure-like activity, possible apnea.  He defers any physical exam, laboratory evaluation.  Requests discharge after a brief screening.  He was discharged per his request, given his capacity to make that decision.   Gerhard Munch, MD 07/16/12 2113

## 2012-07-16 NOTE — ED Notes (Addendum)
Family at bedside. 

## 2012-07-18 ENCOUNTER — Emergency Department (HOSPITAL_COMMUNITY)
Admission: EM | Admit: 2012-07-18 | Discharge: 2012-07-19 | Disposition: A | Discharge status: Home / Self Care | Payer: Self-pay | Attending: Emergency Medicine | Admitting: Emergency Medicine

## 2012-07-18 ENCOUNTER — Encounter (HOSPITAL_COMMUNITY): Payer: Self-pay | Admitting: *Deleted

## 2012-07-18 ENCOUNTER — Emergency Department (HOSPITAL_COMMUNITY): Payer: Self-pay

## 2012-07-18 DIAGNOSIS — J40 Bronchitis, not specified as acute or chronic: Secondary | ICD-10-CM | POA: Insufficient documentation

## 2012-07-18 DIAGNOSIS — F17200 Nicotine dependence, unspecified, uncomplicated: Secondary | ICD-10-CM | POA: Insufficient documentation

## 2012-07-18 DIAGNOSIS — F411 Generalized anxiety disorder: Secondary | ICD-10-CM | POA: Insufficient documentation

## 2012-07-18 DIAGNOSIS — F909 Attention-deficit hyperactivity disorder, unspecified type: Secondary | ICD-10-CM | POA: Insufficient documentation

## 2012-07-18 DIAGNOSIS — A539 Syphilis, unspecified: Secondary | ICD-10-CM | POA: Insufficient documentation

## 2012-07-18 LAB — CBC WITH DIFFERENTIAL/PLATELET
Basophils Absolute: 0 10*3/uL (ref 0.0–0.1)
Basophils Relative: 0 % (ref 0–1)
Eosinophils Absolute: 0.4 10*3/uL (ref 0.0–0.7)
Eosinophils Relative: 4 % (ref 0–5)
HCT: 41.1 % (ref 39.0–52.0)
Hemoglobin: 14.1 g/dL (ref 13.0–17.0)
Lymphocytes Relative: 21 % (ref 12–46)
Lymphs Abs: 2.2 10*3/uL (ref 0.7–4.0)
MCH: 27.8 pg (ref 26.0–34.0)
MCHC: 34.3 g/dL (ref 30.0–36.0)
MCV: 80.9 fL (ref 78.0–100.0)
Monocytes Absolute: 0.7 10*3/uL (ref 0.1–1.0)
Monocytes Relative: 7 % (ref 3–12)
Neutro Abs: 7.2 10*3/uL (ref 1.7–7.7)
Neutrophils Relative %: 69 % (ref 43–77)
Platelets: 342 10*3/uL (ref 150–400)
RBC: 5.08 MIL/uL (ref 4.22–5.81)
RDW: 13.1 % (ref 11.5–15.5)
WBC: 10.5 10*3/uL (ref 4.0–10.5)

## 2012-07-18 LAB — POCT I-STAT, CHEM 8
Calcium, Ion: 1.19 mmol/L (ref 1.12–1.23)
Chloride: 107 mEq/L (ref 96–112)
Creatinine, Ser: 1 mg/dL (ref 0.50–1.35)
Glucose, Bld: 105 mg/dL — ABNORMAL HIGH (ref 70–99)
Potassium: 3.4 mEq/L — ABNORMAL LOW (ref 3.5–5.1)

## 2012-07-18 MED ORDER — ALBUTEROL SULFATE HFA 108 (90 BASE) MCG/ACT IN AERS
2.0000 | INHALATION_SPRAY | RESPIRATORY_TRACT | Status: DC | PRN
  Administered 2012-07-19: 2 via RESPIRATORY_TRACT
  Filled 2012-07-18: qty 6.7

## 2012-07-18 MED ORDER — IBUPROFEN 400 MG PO TABS: 800.0000 mg | ORAL_TABLET | Freq: Once | ORAL | Status: DC

## 2012-07-18 MED ORDER — AEROCHAMBER Z-STAT PLUS/MEDIUM MISC
1.0000 | Freq: Once | Status: AC
  Administered 2012-07-19: 1
  Filled 2012-07-18: qty 1

## 2012-07-18 MED ORDER — DOXYCYCLINE HYCLATE 100 MG PO TABS
100.0000 mg | ORAL_TABLET | Freq: Once | ORAL | Status: AC
  Administered 2012-07-19: 100 mg via ORAL
  Filled 2012-07-18: qty 1

## 2012-07-18 MED ORDER — PENICILLIN G BENZATHINE 1200000 UNIT/2ML IM SUSP
2.4000 10*6.[IU] | Freq: Once | INTRAMUSCULAR | Status: AC
  Administered 2012-07-19: 2.4 10*6.[IU] via INTRAMUSCULAR
  Filled 2012-07-18 (×3): qty 2

## 2012-07-18 NOTE — ED Provider Notes (Signed)
History  This chart was scribed for [name], MD by Bennett Scrape. This patient was seen in room TR09C/TR09C and the patient's care was started at 10:54PM.  CSN: [id number]  Arrival date & time 07/18/12  2225   First MD Initiated Contact with Patient 07/18/12 2254      Chief Complaint  Patient presents with  . URI  . Rash    testicles and penis and rash to hands/feet     The history is provided by the patient. No language interpreter was used.    Pedro Owens is a 21 y.o. male who presents to the Emergency Department complaining of 2 weeks of gradual onset, gradually worsening, constant cough productive of green mucus with associated hoarse throat, generalized fatigue, chest congestion, nasal congestion, HA, myalgias and wheezing. He reports emesis and diarrhea 3 days ago that have since resolved. He denies taking OTC medications at home to improve symptoms. He denies having any sick contacts with similar symptoms. He also reports a rash that started on his testicles that has spread to his penis, scrotum, hands and feet. He denies having prior episodes of similar symptoms or sick contacts with similar symptoms. He denies fever, chills, abdominal pain, chest pain and syncope as associated symptoms. He has a h/o ADHD, anxiety and Crohn's pain (takes 10 mg oxycodone). He is a current everyday smoker but denies alcohol use.  Dr. Dickie La was GI specialist, hasn't seen her in more than 6 months.  Past Medical History  Diagnosis Date  . Ileitis   . ADHD (attention deficit hyperactivity disorder)   . Lymphoid hyperplasia, reactive   . Anxiety   . Marijuana abuse   . Gonorrhea 09/08/11  . Anal warts   . Pseudoseizures   . Chronic pain syndrome     Past Surgical History  Procedure Date  . Foot surgery     bilateral  . Ankle surgery     bilateral    Family History  Problem Relation Age of Onset  . Colon cancer Neg Hx     History  Substance Use Topics  . Smoking  status: Current Every Day Smoker -- 0.5 packs/day    Types: Cigarettes  . Smokeless tobacco: Not on file  . Alcohol Use: No      Review of Systems  A complete 10 system review of systems was obtained and all systems are negative except as noted in the HPI and PMH.    Allergies  Dilaudid  Home Medications   Current Outpatient Rx  Name Route Sig Dispense Refill  . GUAIFENESIN 100 MG/5ML PO LIQD Oral Take 300 mg by mouth 4 (four) times daily as needed. Cough.    . OXYCODONE HCL ER 10 MG PO TB12  Take 1 tablet by mouth three times daily    . DOXYCYCLINE HYCLATE 100 MG PO CAPS Oral Take 1 capsule (100 mg total) by mouth 2 (two) times daily. 20 capsule 0    Triage Vitals: BP 120/83  Pulse 74  Temp 99.2 F (37.3 C) (Oral)  Resp 18  SpO2 98%  Physical Exam  Nursing note and vitals reviewed. Constitutional: He is oriented to person, place, and time. He appears well-developed and well-nourished. No distress.  HENT:  Head: Normocephalic and atraumatic.       posterior oropharynx is mildly erythematous, no tonsillar edema or exudate, TMs are normal bilaterally  Eyes: Conjunctivae normal and EOM are normal. Pupils are equal, round, and reactive to light.  Neck: Neck  supple. No tracheal deviation present.  Cardiovascular: Normal rate and regular rhythm.   Pulmonary/Chest: Effort normal and breath sounds normal. No respiratory distress.       Congested cough  Abdominal: He exhibits no distension.  Genitourinary:       slightly raised depigmented skin lesions measuring 4 to 6 mm on the penis and scrotum, no urethral discharge, slight adenopathy of the right inguinal chain  Musculoskeletal: Normal range of motion.  Neurological: He is alert and oriented to person, place, and time. No sensory deficit.  Skin: Skin is dry.       Flat slightly erythematous skin lesions on both palms 2 mm in size and on both soles 4 mm in size  Psychiatric: He has a normal mood and affect. His behavior is  normal.    ED Course  Procedures (including critical care time)  DIAGNOSTIC STUDIES: Oxygen Saturation is 98% on room air, normal by my interpretation.    COORDINATION OF CARE: 11:28PM-Informed pt of negative radiology report and advised pt that I am still awaiting one last blood test. Discussed treatment plan for syphilis and bronchitis with pt at bedside and pt agreed to plan. Will have pt follow up with an STD clinic.  11:39PM-Nurse informed me that pt is requesting something for severe 8/10 body aches. Will order ibuprofen.  11:45PM-Ordered 2.4 million unit bicillin injection, 800 mg ibuprofen, 100 mg doxycycline and albuterol. Also order an aero chamber z-stat mask.  12:36AM-Pt rechecked and is resting comfortably. Discussed discharge plan with pt at bedside and pt agreed to plan. Advised pt to inform sex partners of STD diagnosis and follow up with STD clinic.  Labs Reviewed  POCT I-STAT, CHEM 8 - Abnormal; Notable for the following:    Potassium 3.4 (*)     Glucose, Bld 105 (*)     All other components within normal limits  CBC WITH DIFFERENTIAL   Dg Chest 2 View  07/18/2012  *RADIOLOGY REPORT*  Clinical Data: Cough  CHEST - 2 VIEW  Comparison: 12/04/2010  Findings: Lungs are clear. No pleural effusion or pneumothorax. The cardiomediastinal contours are within normal limits. The visualized bones and soft tissues are without significant appreciable abnormality.  IMPRESSION: No radiographic evidence of acute cardiopulmonary process.   Original Report Authenticated By: Waneta Martins, M.D.      1. Bronchitis   2. Syphilis       MDM  Pt. With signs/sx of secondary syphilis. Also he has bronchitis without PNE. Doubt metabolic instability, serious bacterial infection or impending vascular collapse; the patient is stable for discharge.   Plan: Home Medications- albuterol inhaler; Home Treatments- use albuterol inhaler as recommended to treat symptoms; Recommended follow  up- with STD clinic in one month for second syphilis shot    I personally performed the services described in this documentation, which was scribed in my presence. The recorded information has been reviewed and considered.     [name], MD 07/19/12 [id number]

## 2012-07-18 NOTE — ED Notes (Signed)
Pt is here to be tx for upper resp infection and a rash.  He has been fighting a cough x 2 weeks that is not getting better.  One week ago he noticed a Passage, pruritic rash to his genitals.  Today he noticed "spots" on his hands and feet.  Pt presently febrile.

## 2012-07-19 MED ORDER — DOXYCYCLINE HYCLATE 100 MG PO CAPS: 100.0000 mg | ORAL_CAPSULE | Freq: Two times a day (BID) | ORAL | Status: DC

## 2012-07-19 NOTE — ED Notes (Signed)
Pt for discharge.Vitals signs stable and GCS 15

## 2013-06-21 ENCOUNTER — Emergency Department (HOSPITAL_COMMUNITY): Payer: Self-pay

## 2013-06-21 ENCOUNTER — Encounter (HOSPITAL_COMMUNITY): Payer: Self-pay | Admitting: Emergency Medicine

## 2013-06-21 ENCOUNTER — Emergency Department (HOSPITAL_COMMUNITY): Payer: Medicaid Other

## 2013-06-21 ENCOUNTER — Emergency Department (HOSPITAL_COMMUNITY)
Admission: EM | Admit: 2013-06-21 | Discharge: 2013-06-21 | Disposition: A | Discharge status: Home / Self Care | Payer: No Typology Code available for payment source | Attending: Emergency Medicine | Admitting: Emergency Medicine

## 2013-06-21 DIAGNOSIS — Y9389 Activity, other specified: Secondary | ICD-10-CM | POA: Insufficient documentation

## 2013-06-21 DIAGNOSIS — S8990XA Unspecified injury of unspecified lower leg, initial encounter: Secondary | ICD-10-CM | POA: Insufficient documentation

## 2013-06-21 DIAGNOSIS — F17200 Nicotine dependence, unspecified, uncomplicated: Secondary | ICD-10-CM | POA: Insufficient documentation

## 2013-06-21 DIAGNOSIS — M25562 Pain in left knee: Secondary | ICD-10-CM

## 2013-06-21 DIAGNOSIS — Z8659 Personal history of other mental and behavioral disorders: Secondary | ICD-10-CM | POA: Insufficient documentation

## 2013-06-21 DIAGNOSIS — Z9889 Other specified postprocedural states: Secondary | ICD-10-CM | POA: Insufficient documentation

## 2013-06-21 DIAGNOSIS — Z8719 Personal history of other diseases of the digestive system: Secondary | ICD-10-CM | POA: Insufficient documentation

## 2013-06-21 DIAGNOSIS — G894 Chronic pain syndrome: Secondary | ICD-10-CM | POA: Insufficient documentation

## 2013-06-21 DIAGNOSIS — Z8619 Personal history of other infectious and parasitic diseases: Secondary | ICD-10-CM | POA: Insufficient documentation

## 2013-06-21 DIAGNOSIS — Y929 Unspecified place or not applicable: Secondary | ICD-10-CM | POA: Insufficient documentation

## 2013-06-21 MED ORDER — IBUPROFEN 600 MG PO TABS: 600.0000 mg | ORAL_TABLET | Freq: Four times a day (QID) | ORAL | Status: DC | PRN

## 2013-06-21 MED ORDER — HYDROCODONE-ACETAMINOPHEN 5-325 MG PO TABS
2.0000 | ORAL_TABLET | Freq: Once | ORAL | Status: AC
  Administered 2013-06-21: 2 via ORAL
  Filled 2013-06-21: qty 2

## 2013-06-21 MED ORDER — HYDROCODONE-ACETAMINOPHEN 5-325 MG PO TABS: ORAL_TABLET | ORAL | Status: DC

## 2013-06-21 NOTE — ED Notes (Signed)
Pt has a ride home.  
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
## 2013-06-21 NOTE — ED Notes (Signed)
Ortho tech called for application of crutches and ACE wrap. 

## 2013-06-23 NOTE — ED Provider Notes (Signed)
Medical screening examination/treatment/procedure(s) were performed by non-physician practitioner and as supervising physician I was immediately available for consultation/collaboration.  Mackenzie Lia T Havanah Nelms, MD 06/23/13 0759 

## 2013-11-19 ENCOUNTER — Encounter (HOSPITAL_COMMUNITY): Payer: Self-pay | Admitting: Emergency Medicine

## 2013-11-19 ENCOUNTER — Emergency Department (HOSPITAL_COMMUNITY)
Admission: EM | Admit: 2013-11-19 | Discharge: 2013-11-19 | Disposition: A | Discharge status: Home / Self Care | Payer: No Typology Code available for payment source | Attending: Emergency Medicine | Admitting: Emergency Medicine

## 2013-11-19 DIAGNOSIS — K089 Disorder of teeth and supporting structures, unspecified: Secondary | ICD-10-CM | POA: Insufficient documentation

## 2013-11-19 DIAGNOSIS — K0889 Other specified disorders of teeth and supporting structures: Secondary | ICD-10-CM

## 2013-11-19 DIAGNOSIS — G894 Chronic pain syndrome: Secondary | ICD-10-CM | POA: Insufficient documentation

## 2013-11-19 DIAGNOSIS — Z8619 Personal history of other infectious and parasitic diseases: Secondary | ICD-10-CM | POA: Insufficient documentation

## 2013-11-19 DIAGNOSIS — Z8659 Personal history of other mental and behavioral disorders: Secondary | ICD-10-CM | POA: Insufficient documentation

## 2013-11-19 DIAGNOSIS — F17200 Nicotine dependence, unspecified, uncomplicated: Secondary | ICD-10-CM | POA: Insufficient documentation

## 2013-11-19 HISTORY — DX: Major depressive disorder, single episode, unspecified: F32.9

## 2013-11-19 HISTORY — DX: Depression, unspecified: F32.A

## 2013-11-19 MED ORDER — HYDROCODONE-ACETAMINOPHEN 5-325 MG PO TABS: 1.0000 | ORAL_TABLET | Freq: Four times a day (QID) | ORAL | Status: DC | PRN

## 2013-11-19 MED ORDER — PENICILLIN V POTASSIUM 500 MG PO TABS: 500.0000 mg | ORAL_TABLET | Freq: Three times a day (TID) | ORAL | Status: DC

## 2013-11-19 NOTE — Discharge Instructions (Signed)

## 2013-11-19 NOTE — ED Provider Notes (Signed)
Medical screening examination/treatment/procedure(s) were performed by non-physician practitioner and as supervising physician I was immediately available for consultation/collaboration.  EKG Interpretation   None         Charles B. Bernette Mayers, MD 11/19/13 1534

## 2013-11-19 NOTE — ED Provider Notes (Signed)
CSN: [id number]     Arrival date & time 11/19/13  1423 History   First MD Initiated Contact with Patient 11/19/13 1504     Chief Complaint  Patient presents with  . Dental Pain   (Consider location/radiation/quality/duration/timing/severity/associated sxs/prior Treatment) HPI   23 year old male presents c/o dental pain.  Report intermittent pain to L upper tooth for the past 2 months, worsening within the past week.  Pain is sharp, non radiating, worse with chewing and cold air.  Pain was alleviate with ibuprofen but now less effective.  No fever, ear pain, sore throat, trouble swallowing, neck pain, or rash.  No recent trauma.  Does not have dental insurance and unable to f/u with dentist.     Past Medical History  Diagnosis Date  . Ileitis   . ADHD (attention deficit hyperactivity disorder)   . Lymphoid hyperplasia, reactive   . Anxiety   . Marijuana abuse   . Gonorrhea 09/08/11  . Anal warts   . Pseudoseizures   . Chronic pain syndrome   . Crohn's disease   . Depression    Past Surgical History  Procedure Laterality Date  . Foot surgery      bilateral  . Ankle surgery      bilateral   Family History  Problem Relation Age of Onset  . Colon cancer Neg Hx    History  Substance Use Topics  . Smoking status: Current Every Day Smoker -- 1.00 packs/day for 5 years    Types: Cigarettes  . Smokeless tobacco: Never Used  . Alcohol Use: No    Review of Systems  Constitutional: Negative for fever.  HENT: Positive for dental problem. Negative for ear pain.   Neurological: Negative for headaches.    Allergies  Vicodin and Dilaudid  Home Medications   Current Outpatient Rx  Name  Route  Sig  Dispense  Refill  . HYDROcodone-acetaminophen (NORCO/VICODIN) 5-325 MG per tablet      Take 1-2 tablets every 6 hours as needed for severe pain   10 tablet   0   . ibuprofen (ADVIL,MOTRIN) 600 MG tablet   Oral   Take 1 tablet (600 mg total) by mouth every 6 (six) hours as  needed for pain.   20 tablet   0    BP 133/78  Pulse 67  Temp(Src) 98.1 F (36.7 C) (Oral)  Resp 14  Ht 5\' 11"  (1.803 m)  Wt 148 lb (67.132 kg)  BMI 20.65 kg/m2  SpO2 97% Physical Exam  Constitutional: He appears well-developed and well-nourished. No distress.  HENT:  Head: Atraumatic.  Mouth: good dentition.  Tenderness to tooth #12 without obvious decal, gingivitis or abscess amenable for drainage.  No trismus.    Eyes: Conjunctivae are normal.  Neck: Normal range of motion. Neck supple.  Lymphadenopathy:    He has no cervical adenopathy.  Neurological: He is alert.  Skin: No rash noted.  Psychiatric: He has a normal mood and affect.    ED Course  Procedures (including critical care time)  3:16 PM Dental pain, no obvious abscess.  Will prescribe abx, pain meds and dental referral.  Return precaution discussed.    Labs Review Labs Reviewed - No data to display Imaging Review No results found.  EKG Interpretation   None       MDM   1. Pain, dental    BP 133/78  Pulse 67  Temp(Src) 98.1 F (36.7 C) (Oral)  Resp 14  Ht 5\' 11"  (  1.803 m)  Wt 148 lb (67.132 kg)  BMI 20.65 kg/m2  SpO2 97%     Fayrene HelperBowie Jorge Amparo, PA-C 11/19/13 1521

## 2013-11-19 NOTE — ED Notes (Signed)
Pt c/o back L toothache for past weeks, worse today, does not have any dental insurance

## 2013-12-22 ENCOUNTER — Ambulatory Visit: Payer: Medicaid Other | Admitting: Physician Assistant

## 2014-04-05 ENCOUNTER — Encounter (HOSPITAL_COMMUNITY): Payer: Self-pay | Admitting: Emergency Medicine

## 2014-04-05 ENCOUNTER — Emergency Department (HOSPITAL_COMMUNITY)
Admission: EM | Admit: 2014-04-05 | Discharge: 2014-04-05 | Disposition: A | Discharge status: Home / Self Care | Payer: No Typology Code available for payment source | Attending: Emergency Medicine | Admitting: Emergency Medicine

## 2014-04-05 DIAGNOSIS — Z8719 Personal history of other diseases of the digestive system: Secondary | ICD-10-CM | POA: Insufficient documentation

## 2014-04-05 DIAGNOSIS — G894 Chronic pain syndrome: Secondary | ICD-10-CM | POA: Insufficient documentation

## 2014-04-05 DIAGNOSIS — J029 Acute pharyngitis, unspecified: Secondary | ICD-10-CM | POA: Insufficient documentation

## 2014-04-05 DIAGNOSIS — K1379 Other lesions of oral mucosa: Secondary | ICD-10-CM

## 2014-04-05 DIAGNOSIS — Z8619 Personal history of other infectious and parasitic diseases: Secondary | ICD-10-CM | POA: Insufficient documentation

## 2014-04-05 DIAGNOSIS — F17200 Nicotine dependence, unspecified, uncomplicated: Secondary | ICD-10-CM | POA: Insufficient documentation

## 2014-04-05 DIAGNOSIS — Z8659 Personal history of other mental and behavioral disorders: Secondary | ICD-10-CM | POA: Insufficient documentation

## 2014-04-05 LAB — RAPID STREP SCREEN (MED CTR MEBANE ONLY): STREPTOCOCCUS, GROUP A SCREEN (DIRECT): NEGATIVE

## 2014-04-05 MED ORDER — DEXAMETHASONE 4 MG PO TABS
10.0000 mg | ORAL_TABLET | Freq: Once | ORAL | Status: AC
  Administered 2014-04-05: 10 mg via ORAL
  Filled 2014-04-05: qty 3

## 2014-04-05 MED ORDER — IBUPROFEN 800 MG PO TABS
800.0000 mg | ORAL_TABLET | Freq: Once | ORAL | Status: AC
  Administered 2014-04-05: 800 mg via ORAL
  Filled 2014-04-05: qty 1

## 2014-04-05 NOTE — ED Provider Notes (Signed)
TIME SEEN: 9:24 AM  CHIEF COMPLAINT: "That thing in the back of her throat is swollen"  HPI: Patient is a 23 year old male with history of Crohn's disease, chronic pain, pseudoseizures who presents to the emergency department with complaints of uvular swelling, sore throat. He states that he woke up this morning he felt like he was choking and went outside to spit and notice he had some phlegm with blood streaks. He denies any fevers or chills. No recent sick contacts. No headache, neck pain or neck stiffness. No vomiting or diarrhea. No cough. No difficulty swallowing, speaking or breathing. No dental pain.  ROS: See HPI Constitutional: no fever  Eyes: no drainage  ENT: no runny nose   Cardiovascular:  no chest pain  Resp: no SOB  GI: no vomiting GU: no dysuria Integumentary: no rash  Allergy: no hives  Musculoskeletal: no leg swelling  Neurological: no slurred speech ROS otherwise negative  PAST MEDICAL HISTORY/PAST SURGICAL HISTORY:  Past Medical History  Diagnosis Date  . Ileitis   . ADHD (attention deficit hyperactivity disorder)   . Lymphoid hyperplasia, reactive   . Anxiety   . Marijuana abuse   . Gonorrhea 09/08/11  . Anal warts   . Pseudoseizures   . Chronic pain syndrome   . Crohn's disease   . Depression     MEDICATIONS:  Prior to Admission medications   Not on File    ALLERGIES:  Allergies  Allergen Reactions  . Vicodin [Hydrocodone-Acetaminophen] Nausea And Vomiting  . Dilaudid [Hydromorphone Hcl] Rash    REACTION: rash    SOCIAL HISTORY:  History  Substance Use Topics  . Smoking status: Current Every Day Smoker -- 1.00 packs/day for 5 years    Types: Cigarettes  . Smokeless tobacco: Never Used  . Alcohol Use: No    FAMILY HISTORY: Family History  Problem Relation Age of Onset  . Colon cancer Neg Hx     EXAM: BP 133/86  Pulse 77  Temp(Src) 97.7 F (36.5 C) (Oral)  Resp 20  SpO2 98% CONSTITUTIONAL: Alert and oriented and responds  appropriately to questions. Well-appearing; well-nourished HEAD: Normocephalic EYES: Conjunctivae clear, PERRL ENT: normal nose; no rhinorrhea; moist mucous membranes; patient's posterior oropharynx is slightly erythematous with mild tonsillar hypertrophy bilaterally, no exudate, no uvular deviation but there is very mild uvular edema, no trismus or drooling, no dental caries or abscess, no voice changes, no stridor NECK: Supple, no meningismus, no LAD  CARD: RRR; S1 and S2 appreciated; no murmurs, no clicks, no rubs, no gallops RESP: Normal chest excursion without splinting or tachypnea; breath sounds clear and equal bilaterally; no wheezes, no rhonchi, no rales, no respiratory distress or hypoxia ABD/GI: Normal bowel sounds; non-distended; soft, non-tender, no rebound, no guarding BACK:  The back appears normal and is non-tender to palpation, there is no CVA tenderness EXT: Normal ROM in all joints; non-tender to palpation; no edema; normal capillary refill; no cyanosis    SKIN: Normal color for age and race; warm NEURO: Moves all extremities equally PSYCH: The patient's mood and manner are appropriate. Grooming and personal hygiene are appropriate.  MEDICAL DECISION MAKING: Patient here with posterior oral pharyngeal erythema and mild uvular edema. Suspect viral illness. He denies any new exposures. He has had angioedema, hives, wheezing or stridor to suggest any allergic reaction. No history of trauma. I am not concerned for any deep space neck infection, peritonsillar abscess. We'll send strep swab, give Decadron and ibuprofen. Anticipate discharge home.  ED PROGRESS: Patient's  strep test is negative. He is still hemodynamically stable with no stridor, wheezing, respiratory distress. What I walk in the room he is testing on his cell phone. Patient is very upset and states he is still in pain and requesting sending off or pain. Have discussed with him that he can alternate Tylenol and Motrin at  home. I do not feel narcotic pain medication is indicated for a sore throat. When I discussed with patient that I think he has a viral pharyngitis, patient becomes obviously upset. He reports his pulse oximeter and blood pressure cuff and threw them at me. Patient stood outside of his room with his arms crossed demanding his discharge paperwork. Given we have multiple sick patients, patient was asked to wait in his room and he left the emergency department without receiving paperwork. He was not going to receive any prescription medications as I do not feel he needs antibiotics or narcotics. He was given a dose of Decadron in the emergency department. Again I do not feel there is any life-threatening illness for this patient.     Layla Maw Kamuela Magos, DO 04/05/14 1024

## 2014-04-05 NOTE — ED Notes (Signed)
PT ambulated with baseline gait; VSS; A&Ox3; no signs of distress; respirations even and unlabored; skin warm and dry.

## 2014-04-05 NOTE — ED Notes (Signed)
Pt standing at nurses desk; pt informed that MD working on discharge papers. Pt reports that he will wait right here. Pt politely informed needs to return to room. Pt left unit.

## 2014-04-05 NOTE — ED Notes (Signed)
Pt reports today he was sleeping and woke up feeling like he was choking, so he went outside to spit. Feels like phlegm is in his throat but it isn't. Also, "I feel like that thing in the back of my throat is moving". Pt is a x 4. Airway intact. VSS

## 2014-04-07 LAB — CULTURE, GROUP A STREP

## 2014-09-06 ENCOUNTER — Telehealth: Payer: Self-pay | Admitting: Internal Medicine

## 2014-09-06 NOTE — Telephone Encounter (Signed)
Pts mother states that the pt is having problems with his crohns and he is in a lot of pain. States he needs to be seen but when she takes him to the ER they just give him pain med and send him home. Requested pt be seen today. Let pts mother know there are no appts available and that she should take him to he ER To be seen. Dr. Juanda Chance is hospital doc this week. Pts mother states she will take him to [facility] ER tomorrow around 8am. Note sent to Dr. Juanda Chance.

## 2015-07-02 ENCOUNTER — Emergency Department (HOSPITAL_COMMUNITY)
Admission: EM | Admit: 2015-07-02 | Discharge: 2015-07-03 | Disposition: A | Discharge status: Home / Self Care | Payer: Medicaid Other | Attending: Emergency Medicine | Admitting: Emergency Medicine

## 2015-07-02 ENCOUNTER — Encounter (HOSPITAL_COMMUNITY): Payer: Self-pay | Admitting: Emergency Medicine

## 2015-07-02 DIAGNOSIS — Z8619 Personal history of other infectious and parasitic diseases: Secondary | ICD-10-CM | POA: Insufficient documentation

## 2015-07-02 DIAGNOSIS — R569 Unspecified convulsions: Secondary | ICD-10-CM

## 2015-07-02 DIAGNOSIS — K5090 Crohn's disease, unspecified, without complications: Secondary | ICD-10-CM | POA: Insufficient documentation

## 2015-07-02 DIAGNOSIS — Z720 Tobacco use: Secondary | ICD-10-CM | POA: Insufficient documentation

## 2015-07-02 DIAGNOSIS — Z8659 Personal history of other mental and behavioral disorders: Secondary | ICD-10-CM | POA: Insufficient documentation

## 2015-07-02 DIAGNOSIS — G894 Chronic pain syndrome: Secondary | ICD-10-CM | POA: Insufficient documentation

## 2015-07-02 LAB — CBC WITH DIFFERENTIAL/PLATELET
BASOS PCT: 0 % (ref 0–1)
Basophils Absolute: 0 10*3/uL (ref 0.0–0.1)
EOS ABS: 0.2 10*3/uL (ref 0.0–0.7)
Eosinophils Relative: 4 % (ref 0–5)
HCT: 42.1 % (ref 39.0–52.0)
HEMOGLOBIN: 14.6 g/dL (ref 13.0–17.0)
LYMPHS ABS: 1.9 10*3/uL (ref 0.7–4.0)
Lymphocytes Relative: 27 % (ref 12–46)
MCH: 28.3 pg (ref 26.0–34.0)
MCHC: 34.7 g/dL (ref 30.0–36.0)
MCV: 81.7 fL (ref 78.0–100.0)
Monocytes Absolute: 0.4 10*3/uL (ref 0.1–1.0)
Monocytes Relative: 7 % (ref 3–12)
NEUTROS PCT: 62 % (ref 43–77)
Neutro Abs: 4.3 10*3/uL (ref 1.7–7.7)
Platelets: 246 10*3/uL (ref 150–400)
RBC: 5.15 MIL/uL (ref 4.22–5.81)
RDW: 13.6 % (ref 11.5–15.5)
WBC: 6.8 10*3/uL (ref 4.0–10.5)

## 2015-07-02 LAB — CBG MONITORING, ED: Glucose-Capillary: 85 mg/dL (ref 65–99)

## 2015-07-02 NOTE — ED Provider Notes (Signed)
CSN: [id number]     Arrival date & time 07/02/15  2306 History   This chart was scribed for Derwood Kaplan, MD by Evon Slack, ED Scribe. This patient was seen in room WA02/WA02 and the patient's care was started at 11:13 PM.      Chief Complaint  Patient presents with  . Seizures   Patient is a 24 y.o. male presenting with seizures. The history is provided by the patient. No language interpreter was used.  Seizures  HPI Comments: Pedro Owens is a 24 y.o. male with PMHx crohn's disease brought in by ambulance, who presents to the Emergency Department complaining of seizure x4 onset PTA. 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 Mother states that he has been under a lot from stress due to no taking crohn's medication. Witnessed by his cousin who describes the seizure as generlaized shaking. Cousin states that immediately after the seizure he vomited up bright red blood. Cousin states that the seizure would last about 3 minutes that where right after another. Pt states that last seizure was about 7 months prior. Pt does report regurgitating blood for the past 3-4 weeks. Pt report associated abdominal pain as well. Mother states that he has not been complaint with his medications due to not having insurance. Pt denies blood in his stool, fever or other related symptoms. Denies HX of ETOH use or illicit drug use. Pt states that he smokes about 1.5 packs per day.   Past Medical History  Diagnosis Date  . Ileitis   . ADHD (attention deficit hyperactivity disorder)   . Lymphoid hyperplasia, reactive   . Anxiety   . Marijuana abuse   . Gonorrhea 09/08/11  . Anal warts   . Pseudoseizures   . Chronic pain syndrome   . Crohn's disease   . Depression    Past Surgical History  Procedure Laterality Date  . Foot surgery      bilateral  . Ankle surgery      bilateral   Family History  Problem Relation Age of Onset  . Colon cancer Neg Hx    Social History  Substance Use Topics  . Smoking  status: Current Every Day Smoker -- 1.00 packs/day for 5 years    Types: Cigarettes  . Smokeless tobacco: Never Used  . Alcohol Use: No    Review of Systems  Constitutional: Negative for fever.  Gastrointestinal: Positive for vomiting and abdominal pain. Negative for diarrhea and blood in stool.  Neurological: Positive for seizures.  All other systems reviewed and are negative.    Allergies  Vicodin and Dilaudid  Home Medications   Prior to Admission medications   Medication Sig Start Date End Date Taking? Authorizing Provider  acetaminophen (TYLENOL) 500 MG tablet Take 1,000 mg by mouth every 6 (six) hours as needed for mild pain.   Yes Historical Provider, MD  oxyCODONE-acetaminophen (PERCOCET/ROXICET) 5-325 MG per tablet Take 1 tablet by mouth every 4 (four) hours as needed for severe pain. 07/03/15   Derwood Kaplan, MD  promethazine (PHENERGAN) 25 MG tablet Take 1 tablet (25 mg total) by mouth every 6 (six) hours as needed for nausea or vomiting. 07/03/15   Laural Eiland, MD   BP 121/59 mmHg  Pulse 67  Temp(Src) 98.6 F (37 C) (Oral)  Resp 22  Ht  (1.803 m)  Wt 130 lb (58.968 kg)  BMI 18.14 kg/m2  SpO2 95%   Physical Exam  Constitutional: He appears well-developed and well-nourished.  HENT:  Head: Normocephalic and atraumatic.  Mouth/Throat: Oropharynx is clear and moist.  Eyes: Conjunctivae are normal. Pupils are equal, round, and reactive to light. Right eye exhibits no discharge. Left eye exhibits no discharge. No scleral icterus.  No evidence of jaundice.   Cardiovascular: Normal rate, regular rhythm and normal heart sounds.   Pulmonary/Chest: Effort normal. No respiratory distress.  Abdominal: Soft. Bowel sounds are normal. There is tenderness. There is no rigidity and no guarding.  Positive bowel sounds. No peritoneal signs.    Neurological: He is alert. Coordination normal.  Skin: Skin is warm and dry. No rash noted. He is not diaphoretic. No erythema.   Psychiatric: He has a normal mood and affect.  Nursing note and vitals reviewed.   ED Course  Procedures (including critical care time) DIAGNOSTIC STUDIES: Oxygen Saturation is 99% on RA, normal by my interpretation.    COORDINATION OF CARE: 12:01 AM-Discussed treatment plan with pt at bedside and pt agreed to plan.  1:50 AM- Rechecked with Pt and discussed that we would recheck CBC.     Labs Review Labs Reviewed  BASIC METABOLIC PANEL - Abnormal; Notable for the following:    Anion gap 4 (*)    All other components within normal limits  URINALYSIS, ROUTINE W REFLEX MICROSCOPIC (NOT AT [facility]) - Abnormal; Notable for the following:    APPearance CLOUDY (*)    Hgb urine dipstick LARGE (*)    Leukocytes, UA SMALL (*)    All other components within normal limits  URINE MICROSCOPIC-ADD ON - Abnormal; Notable for the following:    Bacteria, UA FEW (*)    All other components within normal limits  CBC WITH DIFFERENTIAL/PLATELET  URINE RAPID DRUG SCREEN, HOSP PERFORMED  CBC  CBG MONITORING, ED    Imaging Review No results found.    EKG Interpretation None      MDM   Final diagnoses:  Crohn's disease, without complications  Observed seizure-like activity  Nonepileptic episode      I personally performed the services described in this documentation, which was scribed in my presence. The recorded information has been reviewed and is accurate.  Pt comes in with seizure like activity and bloody stools. CBC x 2 is stable. No uremia. I doubt there is severe upper GI bleed. Abd exam is non peritoneal. Imaging is not indicated. Labs are reassuring. Pt has hx of nonepileptic seizures, and the current symptoms appear to be from that. Pt observed for several hours here, and he has been aox3, w/o seizures and there is no focal neuro deficits. Will d.c.      Derwood Kaplan, MD 07/03/15 0330

## 2015-07-02 NOTE — ED Notes (Signed)
Bed: WA02 Expected date:  Expected time:  Means of arrival:  Comments: EMS /69M/seizure x 3

## 2015-07-02 NOTE — ED Notes (Signed)
Per EMS, patient has a history of seizures.  Patient does not take medication.  Patient had 3 seizures back to back lasting 3 minutes each.  Patient is from home.    Zofran given 4 mg in route to hospital.  Patient has been alert but leathery.

## 2015-07-03 LAB — BASIC METABOLIC PANEL
ANION GAP: 4 — AB (ref 5–15)
BUN: 13 mg/dL (ref 6–20)
CALCIUM: 9 mg/dL (ref 8.9–10.3)
CHLORIDE: 106 mmol/L (ref 101–111)
CO2: 28 mmol/L (ref 22–32)
Creatinine, Ser: 0.99 mg/dL (ref 0.61–1.24)
GFR calc non Af Amer: >60 mL/min (ref >60)
GLUCOSE: 95 mg/dL (ref 65–99)
POTASSIUM: 4 mmol/L (ref 3.5–5.1)
Sodium: 138 mmol/L (ref 135–145)

## 2015-07-03 LAB — CBC
HCT: 41.6 % (ref 39.0–52.0)
Hemoglobin: 14.2 g/dL (ref 13.0–17.0)
MCH: 28.2 pg (ref 26.0–34.0)
MCHC: 34.1 g/dL (ref 30.0–36.0)
MCV: 82.7 fL (ref 78.0–100.0)
PLATELETS: 254 10*3/uL (ref 150–400)
RBC: 5.03 MIL/uL (ref 4.22–5.81)
RDW: 13.8 % (ref 11.5–15.5)
WBC: 6.7 10*3/uL (ref 4.0–10.5)

## 2015-07-03 LAB — URINALYSIS, ROUTINE W REFLEX MICROSCOPIC
BILIRUBIN URINE: NEGATIVE
GLUCOSE, UA: NEGATIVE mg/dL
KETONES UR: NEGATIVE mg/dL
Nitrite: NEGATIVE
PH: 7 (ref 5.0–8.0)
PROTEIN: NEGATIVE mg/dL
Specific Gravity, Urine: 1.014 (ref 1.005–1.030)
Urobilinogen, UA: 1 mg/dL (ref 0.0–1.0)

## 2015-07-03 LAB — RAPID URINE DRUG SCREEN, HOSP PERFORMED
AMPHETAMINES: NOT DETECTED
BARBITURATES: NOT DETECTED
BENZODIAZEPINES: NOT DETECTED
COCAINE: NOT DETECTED
Opiates: NOT DETECTED
TETRAHYDROCANNABINOL: NOT DETECTED

## 2015-07-03 LAB — URINE MICROSCOPIC-ADD ON

## 2015-07-03 MED ORDER — MORPHINE SULFATE (PF) 4 MG/ML IV SOLN
4.0000 mg | Freq: Once | INTRAVENOUS | Status: AC
  Administered 2015-07-03: 4 mg via INTRAVENOUS
  Filled 2015-07-03: qty 1

## 2015-07-03 MED ORDER — PROMETHAZINE HCL 25 MG PO TABS: 25.0000 mg | ORAL_TABLET | Freq: Four times a day (QID) | ORAL | Status: DC | PRN

## 2015-07-03 MED ORDER — OXYCODONE-ACETAMINOPHEN 5-325 MG PO TABS: 1.0000 | ORAL_TABLET | ORAL | Status: DC | PRN

## 2015-07-03 MED ORDER — OXYCODONE-ACETAMINOPHEN 5-325 MG PO TABS
2.0000 | ORAL_TABLET | Freq: Once | ORAL | Status: AC
  Administered 2015-07-03: 2 via ORAL
  Filled 2015-07-03: qty 2

## 2015-07-03 NOTE — Discharge Instructions (Signed)
All the results in the ER are normal, labs and imaging. We are not sure what is causing your symptoms, Crohns is like the cause of your pain and so we ask you to see Dr. Juanda Chance. Also provided is [facility] # for primary care reasons.  The workup in the ER is not complete, and is limited to screening for life threatening and emergent conditions only, so please see a primary care doctor for further evaluation.  Please return to the ER if your symptoms worsen; you have increased pain, fevers, chills, inability to keep any medications down, confusion. Otherwise see the outpatient doctor as requested.   Crohn Disease Crohn disease is a long-term (chronic) soreness and redness (inflammation) of the intestines (bowel). It can affect any portion of the digestive tract, from the mouth to the anus. It can also cause problems outside the digestive tract. Crohn disease is closely related to a disease called ulcerative colitis (together, these two diseases are called inflammatory bowel disease).  CAUSES  The cause of Crohn disease is not known. One Nelva Bush is that, in an easily affected person, the immune system is triggered to attack the body's own digestive tissue. Crohn disease runs in families. It seems to be more common in certain geographic areas and amongst certain races. There are no clear-cut dietary causes.  SYMPTOMS  Crohn disease can cause many different symptoms since it can affect many different parts of the body. Symptoms include:  Fatigue.  Weight loss.  Chronic diarrhea, sometime bloody.  Abdominal pain and cramps.  Fever.  Ulcers or canker sores in the mouth or rectum.  Anemia (low red blood cells).  Arthritis, skin problems, and eye problems may occur. Complications of Crohn disease can include:  Series of holes (perforation) of the bowel.  Portions of the intestines sticking to each other (adhesions).  Obstruction of the bowel.  Fistula formation, typically in the  rectal area but also in other areas. A fistula is an opening between the bowels and the outside, or between the bowels and another organ.  A painful crack in the mucous membrane of the anus (rectal fissure). DIAGNOSIS  Your caregiver may suspect Crohn disease based on your symptoms and an exam. Blood tests may confirm that there is a problem. You may be asked to submit a stool specimen for examination. X-rays and CT scans may be necessary. Ultimately, the diagnosis is usually made after a procedure that uses a flexible tube that is inserted via your mouth or your anus. This is done under sedation and is called either an upper endoscopy or colonoscopy. With these tests, the specialist can take tiny tissue samples and remove them from the inside of the bowel (biopsy). Examination of this biopsy tissue under a microscope can reveal Crohn disease as the cause of your symptoms. Due to the many different forms that Crohn disease can take, symptoms may be present for several years before a diagnosis is made. TREATMENT  Medications are often used to decrease inflammation and control the immune system. These include medicines related to aspirin, steroid medications, and newer and stronger medications to slow down the immune system. Some medications may be used as suppositories or enemas. A number of other medications are used or have been studied. Your caregiver will make specific recommendations. HOME CARE INSTRUCTIONS   Symptoms such as diarrhea can be controlled with medications. Avoid foods that have a laxative effect such as fresh fruit, vegetables, and dairy products. During flare-ups, you can rest your bowel  by refraining from solid foods. Drink clear liquids frequently during the day. (Electrolyte or rehydrating fluids are best. Your caregiver can help you with suggestions.) Drink often to prevent loss of body fluids (dehydration). When diarrhea has cleared, eat small meals and more frequently. Avoid food  additives and stimulants such as caffeine (coffee, tea, or chocolate). Enzyme supplements may help if you develop intolerance to a sugar in dairy products (lactose). Ask your caregiver or dietitian about specific dietary instructions.  Try to maintain a positive attitude. Learn relaxation techniques such as self-hypnosis, mental imaging, and muscle relaxation.  If possible, avoid stresses which can aggravate your condition.  Exercise regularly.  Follow your diet.  Always get plenty of rest. SEEK MEDICAL CARE IF:   Your symptoms fail to improve after a week or two of new treatment.  You experience continued weight loss.  You have ongoing cramps or loose bowels.  You develop a new skin rash, skin sores, or eye problems. SEEK IMMEDIATE MEDICAL CARE IF:   You have worsening of your symptoms or develop new symptoms.  You have a fever.  You develop bloody diarrhea.  You develop severe abdominal pain. MAKE SURE YOU:   Understand these instructions.  Will watch your condition.  Will get help right away if you are not doing well or get worse. Document Released: 07/15/2005 Document Revised: 02/19/2014 Document Reviewed: 06/13/2007 [facility] Patient Information 2015 [place], [place]. This information is not intended to replace advice given to you by your health care provider. Make sure you discuss any questions you have with your health care provider.  Nonepileptic Seizures Nonepileptic seizures are seizures that are not caused by abnormal electrical signals in your brain. These seizures often seem like epileptic seizures, but they are not caused by epilepsy.  There are two types of nonepileptic seizures:  A physiologic nonepileptic seizure results from a disruption in your brain.  A psychogenic seizure results from emotional stress. These seizures are sometimes called pseudoseizures. CAUSES  Causes of physiologic nonepileptic seizures include:   Sudden drop in blood  pressure.  Low blood sugar.  Low levels of salt (sodium) in your blood.  Low levels of calcium in your blood.  Migraine.  Heart rhythm problems.  Sleep disorders.  Drug and alcohol abuse. Common causes of psychogenic nonepileptic seizures include:  Stress.  Emotional trauma.  Sexual or physical abuse.  Major life events, such as divorce or the death of a loved one.  Mental health disorders, including panic attack and hyperactivity disorder. SIGNS AND SYMPTOMS A nonepileptic seizure can look like an epileptic seizure, including uncontrollable shaking (convulsions), or changes in attention, behavior, or the ability to remain awake and alert. However, there are some differences. Nonepileptic seizures usually:  Do not cause physical injuries.  Start slowly.  Include crying or shrieking.  Last longer than 2 minutes.  Have a short recovery time without headache or exhaustion. DIAGNOSIS  Your health care provider can usually diagnose nonepileptic seizures after taking your medical history and giving you a physical exam. Your health care provider may want to talk to your friends or relatives who have seen you have a seizure.  You may also need to have tests to look for causes of physiologic nonepileptic seizures. This may include an electroencephalogram (EEG), which is a test that measures electrical activity in your brain. If you have had an epileptic seizure, the results of your EEG will be abnormal. If your health care provider thinks you have had a psychogenic nonepileptic seizure,  you may need to see a mental health specialist for an evaluation. TREATMENT  Treatment depends on the type and cause of your seizures.  For physiologic nonepileptic seizures, treatment is aimed at addressing the underlying condition that caused the seizures. These seizures usually stop when the underlying condition is properly treated.  Nonepileptic seizures do not respond to the seizure medicines  used to treat epilepsy.  For psychogenic seizures, you may need to work with a mental health specialist. HOME CARE INSTRUCTIONS Home care will depend on the type of nonepileptic seizures you have.   Follow all your health care provider's instructions.  Keep all your follow-up appointments. SEEK MEDICAL CARE IF: You continue to have seizures after treatment. SEEK IMMEDIATE MEDICAL CARE IF:  Your seizures change or become more frequent.  You injure yourself during a seizure.  You have one seizure after another.  You have trouble recovering from a seizure.  You have chest pain or trouble breathing. MAKE SURE YOU:  Understand these instructions.  Will watch your condition.  Will get help right away if you are not doing well or get worse. Document Released: 11/20/2005 Document Revised: 02/19/2014 Document Reviewed: 08/01/2013 [facility] Patient Information 2015 [place], [place]. This information is not intended to replace advice given to you by your health care provider. Make sure you discuss any questions you have with your health care provider.

## 2015-07-06 ENCOUNTER — Emergency Department (HOSPITAL_COMMUNITY): Payer: Medicaid Other

## 2015-07-06 ENCOUNTER — Encounter (HOSPITAL_COMMUNITY): Payer: Self-pay | Admitting: Emergency Medicine

## 2015-07-06 ENCOUNTER — Emergency Department (HOSPITAL_COMMUNITY)
Admission: EM | Admit: 2015-07-06 | Discharge: 2015-07-06 | Disposition: A | Discharge status: Home / Self Care | Payer: Medicaid Other | Attending: Physician Assistant | Admitting: Physician Assistant

## 2015-07-06 DIAGNOSIS — Z8719 Personal history of other diseases of the digestive system: Secondary | ICD-10-CM | POA: Insufficient documentation

## 2015-07-06 DIAGNOSIS — Z8659 Personal history of other mental and behavioral disorders: Secondary | ICD-10-CM | POA: Insufficient documentation

## 2015-07-06 DIAGNOSIS — G894 Chronic pain syndrome: Secondary | ICD-10-CM | POA: Insufficient documentation

## 2015-07-06 DIAGNOSIS — R569 Unspecified convulsions: Secondary | ICD-10-CM | POA: Insufficient documentation

## 2015-07-06 DIAGNOSIS — Z8619 Personal history of other infectious and parasitic diseases: Secondary | ICD-10-CM | POA: Insufficient documentation

## 2015-07-06 DIAGNOSIS — Z720 Tobacco use: Secondary | ICD-10-CM | POA: Insufficient documentation

## 2015-07-06 LAB — CBC WITH DIFFERENTIAL/PLATELET
BASOS ABS: 0 10*3/uL (ref 0.0–0.1)
BASOS PCT: 0 %
EOS ABS: 0.4 10*3/uL (ref 0.0–0.7)
EOS PCT: 5 %
HCT: 43.4 % (ref 39.0–52.0)
Hemoglobin: 14.9 g/dL (ref 13.0–17.0)
Lymphocytes Relative: 24 %
Lymphs Abs: 1.8 10*3/uL (ref 0.7–4.0)
MCH: 28.5 pg (ref 26.0–34.0)
MCHC: 34.3 g/dL (ref 30.0–36.0)
MCV: 83 fL (ref 78.0–100.0)
Monocytes Absolute: 0.7 10*3/uL (ref 0.1–1.0)
Monocytes Relative: 9 %
Neutro Abs: 4.6 10*3/uL (ref 1.7–7.7)
Neutrophils Relative %: 62 %
PLATELETS: 213 10*3/uL (ref 150–400)
RBC: 5.23 MIL/uL (ref 4.22–5.81)
RDW: 13.6 % (ref 11.5–15.5)
WBC: 7.4 10*3/uL (ref 4.0–10.5)

## 2015-07-06 LAB — BASIC METABOLIC PANEL
ANION GAP: 4 — AB (ref 5–15)
BUN: 19 mg/dL (ref 6–20)
CALCIUM: 9.2 mg/dL (ref 8.9–10.3)
CO2: 30 mmol/L (ref 22–32)
Chloride: 107 mmol/L (ref 101–111)
Creatinine, Ser: 1.04 mg/dL (ref 0.61–1.24)
GFR calc Af Amer: >60 mL/min (ref >60)
Glucose, Bld: 79 mg/dL (ref 65–99)
POTASSIUM: 4.3 mmol/L (ref 3.5–5.1)
SODIUM: 141 mmol/L (ref 135–145)

## 2015-07-06 NOTE — Discharge Instructions (Signed)
Do not drive. Call neurology on Monday to follow up.  Seizure, Adult A seizure is abnormal electrical activity in the brain. Seizures usually last from 30 seconds to 2 minutes. There are various types of seizures. Before a seizure, you may have a warning sensation (aura) that a seizure is about to occur. An aura may include the following symptoms:   Fear or anxiety.  Nausea.  Feeling like the room is spinning (vertigo).  Vision changes, such as seeing flashing lights or spots. Common symptoms during a seizure include:  A change in attention or behavior (altered mental status).  Convulsions with rhythmic jerking movements.  Drooling.  Rapid eye movements.  Grunting.  Loss of bladder and bowel control.  Bitter taste in the mouth.  Tongue biting. After a seizure, you may feel confused and sleepy. You may also have an injury resulting from convulsions during the seizure. HOME CARE INSTRUCTIONS   If you are given medicines, take them exactly as prescribed by your health care provider.  Keep all follow-up appointments as directed by your health care provider.  Do not swim or drive or engage in risky activity during which a seizure could cause further injury to you or others until your health care provider says it is OK.  Get adequate rest.  Teach friends and family what to do if you have a seizure. They should:  Lay you on the ground to prevent a fall.  Put a cushion under your head.  Loosen any tight clothing around your neck.  Turn you on your side. If vomiting occurs, this helps keep your airway clear.  Stay with you until you recover.  Know whether or not you need emergency care. SEEK IMMEDIATE MEDICAL CARE IF:  The seizure lasts longer than 5 minutes.  The seizure is severe or you do not wake up immediately after the seizure.  You have an altered mental status after the seizure.  You are having more frequent or worsening seizures. Someone should drive you  to the emergency department or call local emergency services (911 in [place] MAKE SURE YOU:  Understand these instructions.  Will watch your condition.  Will get help right away if you are not doing well or get worse. Document Released: 10/02/2000 Document Revised: 07/26/2013 Document Reviewed: 05/17/2013 [facility] Patient Information 2015 [place], [place]. This information is not intended to replace advice given to you by your health care provider. Make sure you discuss any questions you have with your health care provider.

## 2015-07-06 NOTE — ED Notes (Signed)
Bed: WA10 Expected date:  Expected time:  Means of arrival:  Comments: Ems- seizure 

## 2015-07-06 NOTE — ED Provider Notes (Signed)
CSN: [id number]     Arrival date & time 07/06/15  1726 History   First MD Initiated Contact with Patient 07/06/15 1743     Chief Complaint  Patient presents with  . Seizures     (Consider location/radiation/quality/duration/timing/severity/associated sxs/prior Treatment) HPI   Patient is a 24 year old male with history of pseudoseizures. Presenting today with pseudoseizure versus seizure. Patient was working at [employer]. He often has seizures. His manager brought him here today because this is a 6 seizures this week. She normally is able to handle it at [facility] but today felt that he stopped breathing while he was having one of these pseudoseizures/seizures. Patient was alert rectally after the seizure. Patient is remotely on Depakote for seizures but has not been on any AED recently.   He also had some vomiting surrounding this T-sheet fever. He reports that this is normal for him he has vomiting often because of his Crohn's disease. He reports that he felt in usual state of health today. He's been a little bit more anxious at work.    Past Medical History  Diagnosis Date  . Ileitis   . ADHD (attention deficit hyperactivity disorder)   . Lymphoid hyperplasia, reactive   . Anxiety   . Marijuana abuse   . Gonorrhea 09/08/11  . Anal warts   . Pseudoseizures   . Chronic pain syndrome   . Crohn's disease   . Depression    Past Surgical History  Procedure Laterality Date  . Foot surgery      bilateral  . Ankle surgery      bilateral   Family History  Problem Relation Age of Onset  . Colon cancer Neg Hx    Social History  Substance Use Topics  . Smoking status: Current Every Day Smoker -- 1.00 packs/day for 5 years    Types: Cigarettes  . Smokeless tobacco: Never Used  . Alcohol Use: No    Review of Systems  Constitutional: Negative for fever and activity change.  HENT: Negative for drooling and hearing loss.   Eyes: Negative for discharge and redness.   Respiratory: Negative for cough and shortness of breath.   Cardiovascular: Negative for chest pain.  Gastrointestinal: Negative for abdominal pain.  Genitourinary: Negative for dysuria and urgency.  Musculoskeletal: Negative for arthralgias.  Allergic/Immunologic: Negative for immunocompromised state.  Neurological: Positive for seizures. Negative for dizziness, speech difficulty and headaches.  Psychiatric/Behavioral: Negative for behavioral problems and agitation.  All other systems reviewed and are negative.     Allergies  Vicodin and Dilaudid  Home Medications   Prior to Admission medications   Medication Sig Start Date End Date Taking? Authorizing Provider  oxyCODONE-acetaminophen (PERCOCET/ROXICET) 5-325 MG per tablet Take 1 tablet by mouth every 4 (four) hours as needed for severe pain. 07/03/15  Yes Derwood Kaplan, MD  promethazine (PHENERGAN) 25 MG tablet Take 1 tablet (25 mg total) by mouth every 6 (six) hours as needed for nausea or vomiting. 07/03/15  Yes Ankit Nanavati, MD   BP 126/82 mmHg  Pulse 87  Temp(Src) 98.4 F (36.9 C) (Oral)  Resp 18  SpO2 100% Physical Exam  Constitutional: He is oriented to person, place, and time. He appears well-nourished.  HENT:  Head: Normocephalic.  Mouth/Throat: Oropharynx is clear and moist.  Eyes: Conjunctivae are normal.  Neck: No tracheal deviation present.  Cardiovascular: Normal rate.   Pulmonary/Chest: Effort normal. No stridor. No respiratory distress.  Abdominal: Soft. There is no tenderness. There is no guarding.  Musculoskeletal: Normal range of motion. He exhibits no edema.  Neurological: He is oriented to person, place, and time. No cranial nerve deficit.  Skin: Skin is warm and dry. No rash noted. He is not diaphoretic.  Psychiatric: He has a normal mood and affect. His behavior is normal.  Nursing note and vitals reviewed.   ED Course  Procedures (including critical care time) Labs Review Labs Reviewed   CBC WITH DIFFERENTIAL/PLATELET  BASIC METABOLIC PANEL    Imaging Review No results found. I have personally reviewed and evaluated these images and lab results as part of my medical decision-making.   EKG Interpretation None      MDM   Final diagnoses:  None    Patient is a 24 year old male with history of pseudoseizures and seizures. He is presenting today after a pseudoseizure versus seizure. Patient is not on any medications because he was taken off Depakote and has not had any follow-up. Unsure whether this is a pseudoseizure or seizure today. Patient'smanager said that he became alert and oriented directly after seizures so more likely to be a pseudoseizure. Patient feels in his normal state of health now. We will have patient follow-up with a neurologist. Maryclare Labrador make sure there is no infection that causing a lowering of his seizure threshold. We'll also encourage him follow-up with GI physician for his Crohn's disease    Courteney Randall An, MD 07/06/15 [phone]

## 2015-07-06 NOTE — ED Notes (Signed)
Pt BIB EMS. Pt was at work at had witnessed seizure like activity. No mouth trauma, no incontinence. Pt has hx of psuedoseizures. Pt was post-ictal per EMS. Pt able to ambulate to gurney. Pt denies n/v, pain. Skin warm, dry. No acute distress. NSR on monitor.

## 2015-08-01 ENCOUNTER — Emergency Department (HOSPITAL_COMMUNITY): Payer: Medicaid Other

## 2015-08-01 ENCOUNTER — Encounter (HOSPITAL_COMMUNITY): Payer: Self-pay | Admitting: *Deleted

## 2015-08-01 ENCOUNTER — Emergency Department (HOSPITAL_COMMUNITY)
Admission: EM | Admit: 2015-08-01 | Discharge: 2015-08-01 | Disposition: A | Discharge status: Home / Self Care | Payer: Medicaid Other | Attending: Emergency Medicine | Admitting: Emergency Medicine

## 2015-08-01 DIAGNOSIS — R569 Unspecified convulsions: Secondary | ICD-10-CM

## 2015-08-01 DIAGNOSIS — R1084 Generalized abdominal pain: Secondary | ICD-10-CM

## 2015-08-01 DIAGNOSIS — G894 Chronic pain syndrome: Secondary | ICD-10-CM | POA: Insufficient documentation

## 2015-08-01 DIAGNOSIS — Z8619 Personal history of other infectious and parasitic diseases: Secondary | ICD-10-CM | POA: Insufficient documentation

## 2015-08-01 DIAGNOSIS — Z720 Tobacco use: Secondary | ICD-10-CM | POA: Insufficient documentation

## 2015-08-01 DIAGNOSIS — Z8719 Personal history of other diseases of the digestive system: Secondary | ICD-10-CM | POA: Insufficient documentation

## 2015-08-01 DIAGNOSIS — F1310 Sedative, hypnotic or anxiolytic abuse, uncomplicated: Secondary | ICD-10-CM | POA: Insufficient documentation

## 2015-08-01 LAB — URINALYSIS, ROUTINE W REFLEX MICROSCOPIC
Bilirubin Urine: NEGATIVE
Glucose, UA: NEGATIVE mg/dL
Hgb urine dipstick: NEGATIVE
Ketones, ur: NEGATIVE mg/dL
Leukocytes, UA: NEGATIVE
NITRITE: NEGATIVE
PH: 6.5 (ref 5.0–8.0)
Protein, ur: NEGATIVE mg/dL
SPECIFIC GRAVITY, URINE: 1.014 (ref 1.005–1.030)
UROBILINOGEN UA: 1 mg/dL (ref 0.0–1.0)

## 2015-08-01 LAB — CBC WITH DIFFERENTIAL/PLATELET
BASOS PCT: 0 %
Basophils Absolute: 0 10*3/uL (ref 0.0–0.1)
EOS ABS: 0.2 10*3/uL (ref 0.0–0.7)
EOS PCT: 4 %
HCT: 41.2 % (ref 39.0–52.0)
HEMOGLOBIN: 13.8 g/dL (ref 13.0–17.0)
LYMPHS ABS: 1.4 10*3/uL (ref 0.7–4.0)
Lymphocytes Relative: 25 %
MCH: 28.1 pg (ref 26.0–34.0)
MCHC: 33.5 g/dL (ref 30.0–36.0)
MCV: 83.9 fL (ref 78.0–100.0)
MONOS PCT: 7 %
Monocytes Absolute: 0.4 10*3/uL (ref 0.1–1.0)
NEUTROS PCT: 64 %
Neutro Abs: 3.6 10*3/uL (ref 1.7–7.7)
PLATELETS: 234 10*3/uL (ref 150–400)
RBC: 4.91 MIL/uL (ref 4.22–5.81)
RDW: 13.7 % (ref 11.5–15.5)
WBC: 5.6 10*3/uL (ref 4.0–10.5)

## 2015-08-01 LAB — COMPREHENSIVE METABOLIC PANEL
ALK PHOS: 65 U/L (ref 38–126)
ALT: 15 U/L — AB (ref 17–63)
AST: 21 U/L (ref 15–41)
Albumin: 3.8 g/dL (ref 3.5–5.0)
Anion gap: 4 — ABNORMAL LOW (ref 5–15)
BUN: 18 mg/dL (ref 6–20)
CALCIUM: 9 mg/dL (ref 8.9–10.3)
CHLORIDE: 107 mmol/L (ref 101–111)
CO2: 28 mmol/L (ref 22–32)
CREATININE: 0.89 mg/dL (ref 0.61–1.24)
Glucose, Bld: 67 mg/dL (ref 65–99)
Potassium: 3.8 mmol/L (ref 3.5–5.1)
Sodium: 139 mmol/L (ref 135–145)
Total Bilirubin: 0.7 mg/dL (ref 0.3–1.2)
Total Protein: 7.1 g/dL (ref 6.5–8.1)

## 2015-08-01 LAB — POC OCCULT BLOOD, ED: FECAL OCCULT BLD: NEGATIVE

## 2015-08-01 LAB — CBG MONITORING, ED: Glucose-Capillary: 104 mg/dL — ABNORMAL HIGH (ref 65–99)

## 2015-08-01 LAB — RAPID URINE DRUG SCREEN, HOSP PERFORMED
Amphetamines: NOT DETECTED
Barbiturates: NOT DETECTED
Benzodiazepines: POSITIVE — AB
COCAINE: NOT DETECTED
OPIATES: NOT DETECTED
Tetrahydrocannabinol: NOT DETECTED

## 2015-08-01 LAB — LIPASE, BLOOD: LIPASE: 26 U/L (ref 22–51)

## 2015-08-01 NOTE — Progress Notes (Signed)
[facility] consulted regarding medication assistance.  EDCM spoke to patient at bedside.  Patient reports he is employed.  Patient reports his Medicaid ran out when he was 24 years old.  [facility] asked patient if he has reapplied?  Patient stated, "they say I make too much money."   Patient confirms she does not have a pcp or insurance living in [place].  [facility] provided patient contact information to [facility], informed patient of services there.  EDCM also provided patient with list of pcps who accept self pay patients, list of discount pharmacies and websites needymeds.org and GoodRX.com for medication assistance, phone number to inquire about the orange card, phone number to inquire about Mediciad, phone number to inquire about the Affordable Care Act, financial resources in the community such as local churches, salvation army, urban ministries, and dental assistance for uninsured patients.  Patient thankful for resources.  No further EDCM needs at this time.  Patient agreeable to have [facility] send referral to [facility] for orange card.  [facility] referral placed.

## 2015-08-01 NOTE — ED Notes (Addendum)
Introduced self to patient.   Pt sleepy and very vague with answering questions.  Pt states he is not currently taking medicines and has been having "problems" lately due to his crohns disease.  Pt poor historian. Family at bedside.

## 2015-08-01 NOTE — ED Notes (Signed)
Pt states he took his sister's Klonopin 1day ago after having a seizure.

## 2015-08-01 NOTE — ED Notes (Signed)
Per [facility], 4 seizures were witnessed in field. No trauma, pt was let down gently without hitting ground. pt has hx of seizures- has been noncompliant with antiepileptic meds due to cost.

## 2015-08-01 NOTE — Discharge Instructions (Signed)
Seizure, Adult A seizure is abnormal electrical activity in the brain. Seizures usually last from 30 seconds to 2 minutes. There are various types of seizures. Before a seizure, you may have a warning sensation (aura) that a seizure is about to occur. An aura may include the following symptoms:   Fear or anxiety.  Nausea.  Feeling like the room is spinning (vertigo).  Vision changes, such as seeing flashing lights or spots. Common symptoms during a seizure include:  A change in attention or behavior (altered mental status).  Convulsions with rhythmic jerking movements.  Drooling.  Rapid eye movements.  Grunting.  Loss of bladder and bowel control.  Bitter taste in the mouth.  Tongue biting. After a seizure, you may feel confused and sleepy. You may also have an injury resulting from convulsions during the seizure. HOME CARE INSTRUCTIONS   If you are given medicines, take them exactly as prescribed by your health care provider.  Keep all follow-up appointments as directed by your health care provider.  Do not swim or drive or engage in risky activity during which a seizure could cause further injury to you or others until your health care provider says it is OK.  Get adequate rest.  Teach friends and family what to do if you have a seizure. They should:  Lay you on the ground to prevent a fall.  Put a cushion under your head.  Loosen any tight clothing around your neck.  Turn you on your side. If vomiting occurs, this helps keep your airway clear.  Stay with you until you recover.  Know whether or not you need emergency care. SEEK IMMEDIATE MEDICAL CARE IF:  The seizure lasts longer than 5 minutes.  The seizure is severe or you do not wake up immediately after the seizure.  You have an altered mental status after the seizure.  You are having more frequent or worsening seizures. Someone should drive you to the emergency department or call local emergency  services (911 in [place] MAKE SURE YOU:  Understand these instructions.  Will watch your condition.  Will get help right away if you are not doing well or get worse.   This information is not intended to replace advice given to you by your health care provider. Make sure you discuss any questions you have with your health care provider.   Document Released: 10/02/2000 Document Revised: 10/26/2014 Document Reviewed: 05/17/2013 Elsevier Interactive Patient Education 2016 Elsevier Inc. Hematemesis Hematemesis is when you vomit blood. It is a sign of bleeding in the upper part of your digestive tract. This is also called your gastrointestinal (GI) tract. Your upper GI tract includes your mouth, throat, esophagus, stomach, and the first part of your small intestine (duodenum).  Hematemesis is usually caused by bleeding from your esophagus or stomach. You may suddenly vomit bright red blood. You might also vomit old blood. It may look like coffee grounds. You may also have other symptoms, such as:  Stomach pain.  Heartburn.  Black and tarry stool.  HOME CARE INSTRUCTIONS  Watch your hematemesis for any changes. The following actions may help to lessen any discomfort you are feeling:  Take medicines only as directed by your health care provider. Do not take aspirin, ibuprofen, or any other anti-inflammatory medicine without approval from your health care provider.  Rest as needed.  Drink small sips of clear liquids often, as long as you can keep them down. Try to drink enough fluids to keep your urine clear or  pale yellow.  Do not drink alcohol.  Do not use any tobacco products, including cigarettes, chewing tobacco, or electronic cigarettes. If you need help quitting, ask your health care provider.  Keep all follow-up visits as directed by your health care provider. This is important. SEEK MEDICAL CARE IF:   The vomiting of blood worsens, or begins again after it has stopped.  You  have persistent stomach pain.  You have nausea, indigestion, or heartburn.  You feel weak or dizzy. SEEK IMMEDIATE MEDICAL CARE IF:   You faint or feel extremely weak.  You have a rapid heartbeat.  You are urinating less than normal or not at all.  You have persistent vomiting.  You vomit large amounts of bloody or dark material.  You vomit bright red blood.  You pass large, dark, or bloody stools.  You have chest pain or trouble breathing.   This information is not intended to replace advice given to you by your health care provider. Make sure you discuss any questions you have with your health care provider.   Document Released: 11/12/2004 Document Revised: 10/26/2014 Document Reviewed: 05/30/2014 Elsevier Interactive Patient Education [employer]

## 2015-08-01 NOTE — ED Notes (Signed)
Bed: YT01 Expected date:  Expected time:  Means of arrival:  Comments: Ems-seizures

## 2015-08-01 NOTE — ED Notes (Signed)
PA at bedside.

## 2015-08-01 NOTE — ED Provider Notes (Signed)
CSN: [id number]     Arrival date & time 08/01/15  1815 History   First MD Initiated Contact with Patient 08/01/15 1832     Chief Complaint  Patient presents with  . 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  Upon arrival to the ER he is answering questions, alert. He states that with recent employment he lost his [employer] and does not have a doctor and has not been able to take his medications in the past month.  He denies any headache, visual changes, trauma to mouth or body, blurry vision, double vision He is complaining of 10 out of 10 abdominal pain located all over his abdomen, he states when he presses down on his abdomen it makes him "spit up blood."  He denies any lightheadedness, exertional shortness of breath, NSAID use. He denies any alcohol intake, denies illegal drug use. He states that when he gets acutely anxious his sister will give him her Klonopin, he further reports that he has not had medication for seizures for 5 years.  Past Medical History  Diagnosis Date  . Ileitis   . ADHD (attention deficit hyperactivity disorder)   . Lymphoid hyperplasia, reactive   . Anxiety   . Marijuana abuse   . Gonorrhea 09/08/11  . Anal warts   . Pseudoseizures   . Chronic pain syndrome   . Crohn's disease (HCC)   . Depression    Past Surgical History  Procedure Laterality Date  . Foot surgery      bilateral  . Ankle surgery      bilateral   Family History  Problem Relation Age of Onset   . Colon cancer Neg Hx    Social History  Substance Use Topics  . Smoking status: Current Every Day Smoker -- 1.00 packs/day for 5 years    Types: Cigarettes  . Smokeless tobacco: Never Used  . Alcohol Use: No    Review of Systems 10 Systems reviewed and are negative for acute change except as noted in the HPI.      Allergies  Vicodin and Dilaudid  Home Medications   Prior to Admission medications   Medication Sig Start Date End Date Taking? Authorizing Provider  oxyCODONE-acetaminophen (PERCOCET/ROXICET) 5-325 MG per tablet Take 1 tablet by mouth every 4 (four) hours as needed for severe pain. Patient not taking: Reported on 08/01/2015 07/03/15   Derwood Kaplan, MD  promethazine (PHENERGAN) 25 MG tablet Take 1 tablet (25 mg total) by mouth every 6 (six) hours as needed for nausea or vomiting. Patient not taking: Reported on 08/01/2015 07/03/15   Derwood Kaplan, MD   Pulse 62  Temp(Src) 98.1 F (36.7 C) (Oral)  Resp 20  SpO2 100% Physical Exam  Constitutional: He is oriented to person, place, and time. He appears well-developed and well-nourished. No distress.  HENT:  Head: Normocephalic and atraumatic.  Nose: Nose normal.  Mouth/Throat: Oropharynx is clear and moist. No oropharyngeal exudate.  Dried blood noted on lips, no oral laceration, contusion or erythema  Eyes: Conjunctivae and EOM are normal. Pupils are equal, round, and  reactive to light. Right eye exhibits no discharge. Left eye exhibits no discharge. No scleral icterus.  Neck: Normal range of motion. No JVD present. No tracheal deviation present. No thyromegaly present.  Cardiovascular: Normal rate, regular rhythm, normal heart sounds and intact distal pulses.  Exam reveals no gallop and no friction rub.   No murmur heard. Pulmonary/Chest: Effort normal and breath sounds normal. No respiratory distress. He has no wheezes. He has no rales. He exhibits no tenderness.  Abdominal: Soft. Bowel sounds are normal. He  exhibits no distension and no mass. There is tenderness. There is no rebound and no guarding.  Generalized tenderness to palpation, no guarding, no rebound, normal bowel sounds 4, nondistended, no CVA tenderness  Musculoskeletal: Normal range of motion. He exhibits no edema or tenderness.  Lymphadenopathy:    He has no cervical adenopathy.  Neurological: He is alert and oriented to person, place, and time. He has normal reflexes. No cranial nerve deficit. He exhibits normal muscle tone. Coordination normal.  Speech is clear and goal oriented, follows commands Major Cranial nerves without deficit, no facial droop Normal strength in upper and lower extremities bilaterally including dorsiflexion and plantar flexion, strong and equal grip strength Sensation normal to light and sharp touch Moves extremities without ataxia, coordination intact Normal finger to nose and rapid alternating movements Neg romberg, no pronator drift Normal gait and balance   Skin: Skin is warm and dry. No rash noted. He is not diaphoretic. No erythema. No pallor.  Psychiatric: He has a normal mood and affect. His behavior is normal. Judgment and thought content normal.  Nursing note and vitals reviewed.   ED Course  Procedures (including critical care time) Labs Review Labs Reviewed  COMPREHENSIVE METABOLIC PANEL - Abnormal; Notable for the following:    ALT 15 (*)    Anion gap 4 (*)    All other components within normal limits  URINE RAPID DRUG SCREEN, HOSP PERFORMED - Abnormal; Notable for the following:    Benzodiazepines POSITIVE (*)    All other components within normal limits  CBG MONITORING, ED - Abnormal; Notable for the following:    Glucose-Capillary 104 (*)    All other components within normal limits  CBC WITH DIFFERENTIAL/PLATELET  URINALYSIS, ROUTINE W REFLEX MICROSCOPIC (NOT AT [facility])  LIPASE, BLOOD  CBG MONITORING, ED  POC OCCULT BLOOD, ED    Imaging Review No results found. I have  personally reviewed and evaluated these images and lab results as part of my medical decision-making.   EKG Interpretation None      MDM   Final diagnoses:  None   Patient with vague reports of multiple seizures, history of pseudoseizure, also vague history of Crohn, complains of abdominal pain and "spitting up blood." Patient has been noncompliant with medications, although I cannot find any documentation of what those medications are.  He has spit up blood while in ER - witnessed by RN.  No visible oral trauma, negative Hemoccult.  Patient states that if he presses on his abdomen really hard he will spit up blood. He denies any forceful vomiting recently, any bulimia, EtOH use, NSAID use.  The source of blood is unclear, patient is not tachycardic, does not have any lightheadedness, his lab results were unremarkable, no anemia, patient is well-appearing.  The patient does seem to change his affect and amount of alertness depending on who is in the room at the time. At my initial evaluation is alert, talkative, engaged and appropriate. Nursing staff has  observed him the sleepy and fatigued but able to answer questions and easily arousable.  The reports of has initial seizures where unclear coming in from the field, no postictal period was observed by me, he did not hit his head, he was not incontinent of urine or stool.  Presentation is consistent with pseudoseizure.  Patient is asking for pain medicine, and telling me how a previous care provider prescribed him Percocet.  He is intermittently complaining of abdominal pain.    Case management was consulted to assist with establishing follow-up care. He has been given Facilities manager.  Pt stable for discharge home.  He was instructed to follow up on abdominal pain with GI, seizures with neuro as needed.     Danelle Berry, PA-C 08/09/15 0413  Laurence Spates, MD 08/10/15 2204

## 2015-08-04 ENCOUNTER — Emergency Department (HOSPITAL_COMMUNITY): Payer: Medicaid Other

## 2015-08-04 ENCOUNTER — Emergency Department (HOSPITAL_COMMUNITY)
Admission: EM | Admit: 2015-08-04 | Discharge: 2015-08-05 | Disposition: A | Discharge status: Home / Self Care | Payer: Self-pay | Attending: Emergency Medicine | Admitting: Emergency Medicine

## 2015-08-04 ENCOUNTER — Telehealth: Payer: Self-pay | Admitting: Gastroenterology

## 2015-08-04 ENCOUNTER — Encounter (HOSPITAL_COMMUNITY): Payer: Self-pay

## 2015-08-04 DIAGNOSIS — Z8619 Personal history of other infectious and parasitic diseases: Secondary | ICD-10-CM | POA: Insufficient documentation

## 2015-08-04 DIAGNOSIS — Z8719 Personal history of other diseases of the digestive system: Secondary | ICD-10-CM | POA: Insufficient documentation

## 2015-08-04 DIAGNOSIS — R109 Unspecified abdominal pain: Secondary | ICD-10-CM

## 2015-08-04 DIAGNOSIS — R569 Unspecified convulsions: Secondary | ICD-10-CM | POA: Insufficient documentation

## 2015-08-04 DIAGNOSIS — R093 Abnormal sputum: Secondary | ICD-10-CM | POA: Insufficient documentation

## 2015-08-04 DIAGNOSIS — F1310 Sedative, hypnotic or anxiolytic abuse, uncomplicated: Secondary | ICD-10-CM | POA: Insufficient documentation

## 2015-08-04 DIAGNOSIS — Z8659 Personal history of other mental and behavioral disorders: Secondary | ICD-10-CM | POA: Insufficient documentation

## 2015-08-04 DIAGNOSIS — R1012 Left upper quadrant pain: Secondary | ICD-10-CM | POA: Insufficient documentation

## 2015-08-04 DIAGNOSIS — F1410 Cocaine abuse, uncomplicated: Secondary | ICD-10-CM | POA: Insufficient documentation

## 2015-08-04 DIAGNOSIS — Z720 Tobacco use: Secondary | ICD-10-CM | POA: Insufficient documentation

## 2015-08-04 DIAGNOSIS — G894 Chronic pain syndrome: Secondary | ICD-10-CM | POA: Insufficient documentation

## 2015-08-04 DIAGNOSIS — F445 Conversion disorder with seizures or convulsions: Secondary | ICD-10-CM

## 2015-08-04 DIAGNOSIS — F1110 Opioid abuse, uncomplicated: Secondary | ICD-10-CM | POA: Insufficient documentation

## 2015-08-04 HISTORY — DX: Unspecified convulsions: R56.9

## 2015-08-04 LAB — CBC WITH DIFFERENTIAL/PLATELET
Basophils Absolute: 0 10*3/uL (ref 0.0–0.1)
Basophils Relative: 0 %
Eosinophils Absolute: 0.2 10*3/uL (ref 0.0–0.7)
Eosinophils Relative: 3 %
HCT: 39.2 % (ref 39.0–52.0)
Hemoglobin: 13.1 g/dL (ref 13.0–17.0)
Lymphocytes Relative: 21 %
Lymphs Abs: 1.6 10*3/uL (ref 0.7–4.0)
MCH: 27.8 pg (ref 26.0–34.0)
MCHC: 33.4 g/dL (ref 30.0–36.0)
MCV: 83.1 fL (ref 78.0–100.0)
Monocytes Absolute: 0.5 10*3/uL (ref 0.1–1.0)
Monocytes Relative: 6 %
Neutro Abs: 5.5 10*3/uL (ref 1.7–7.7)
Neutrophils Relative %: 70 %
Platelets: 205 10*3/uL (ref 150–400)
RBC: 4.72 MIL/uL (ref 4.22–5.81)
RDW: 13.7 % (ref 11.5–15.5)
WBC: 7.8 10*3/uL (ref 4.0–10.5)

## 2015-08-04 LAB — COMPREHENSIVE METABOLIC PANEL
ALT: 16 U/L — ABNORMAL LOW (ref 17–63)
AST: 24 U/L (ref 15–41)
Albumin: 3.9 g/dL (ref 3.5–5.0)
Alkaline Phosphatase: 64 U/L (ref 38–126)
Anion gap: 6 (ref 5–15)
BUN: 18 mg/dL (ref 6–20)
CO2: 25 mmol/L (ref 22–32)
Calcium: 9.2 mg/dL (ref 8.9–10.3)
Chloride: 111 mmol/L (ref 101–111)
Creatinine, Ser: 0.88 mg/dL (ref 0.61–1.24)
GFR calc Af Amer: >60 mL/min (ref >60)
GFR calc non Af Amer: >60 mL/min (ref >60)
Glucose, Bld: 83 mg/dL (ref 65–99)
Potassium: 3.7 mmol/L (ref 3.5–5.1)
Sodium: 142 mmol/L (ref 135–145)
Total Bilirubin: 0.4 mg/dL (ref 0.3–1.2)
Total Protein: 7.1 g/dL (ref 6.5–8.1)

## 2015-08-04 LAB — URINALYSIS, ROUTINE W REFLEX MICROSCOPIC
Bilirubin Urine: NEGATIVE
Glucose, UA: NEGATIVE mg/dL
Ketones, ur: NEGATIVE mg/dL
Nitrite: NEGATIVE
Protein, ur: NEGATIVE mg/dL
Specific Gravity, Urine: 1.025 (ref 1.005–1.030)
Urobilinogen, UA: 1 mg/dL (ref 0.0–1.0)
pH: 7 (ref 5.0–8.0)

## 2015-08-04 LAB — RAPID URINE DRUG SCREEN, HOSP PERFORMED
Amphetamines: NOT DETECTED
Barbiturates: NOT DETECTED
Benzodiazepines: POSITIVE — AB
Cocaine: POSITIVE — AB
Opiates: POSITIVE — AB
Tetrahydrocannabinol: NOT DETECTED

## 2015-08-04 LAB — URINE MICROSCOPIC-ADD ON

## 2015-08-04 MED ORDER — LORAZEPAM 2 MG/ML IJ SOLN
INTRAMUSCULAR | Status: AC
  Filled 2015-08-04: qty 1

## 2015-08-04 MED ORDER — MORPHINE SULFATE (PF) 4 MG/ML IV SOLN
4.0000 mg | Freq: Once | INTRAVENOUS | Status: AC
  Administered 2015-08-04: 4 mg via INTRAVENOUS
  Filled 2015-08-04: qty 1

## 2015-08-04 MED ORDER — FENTANYL CITRATE (PF) 100 MCG/2ML IJ SOLN
100.0000 ug | Freq: Once | INTRAMUSCULAR | Status: AC
  Administered 2015-08-04: 100 ug via INTRAVENOUS
  Filled 2015-08-04: qty 2

## 2015-08-04 MED ORDER — LORAZEPAM 2 MG/ML IJ SOLN
1.0000 mg | Freq: Once | INTRAMUSCULAR | Status: AC
  Administered 2015-08-04: 1 mg via INTRAVENOUS

## 2015-08-04 NOTE — ED Notes (Signed)
Upon the Pedro Owens entering the room pt. Exhibits what appears to be absent-type seizure activity which lasts ~30 seconds after which he is tearful and upset for only a few moments.  At no time has he had any airway compromise/stridor, nor any other respiratory difficulty.

## 2015-08-04 NOTE — ED Notes (Signed)
RN at bedside when pt began to shake and then spit. Vitals stable. Pt groaned a couple times while shaking and pt appeared to be having a pseudoseizure at this time.

## 2015-08-04 NOTE — ED Notes (Signed)
Seizure pads put in place 

## 2015-08-04 NOTE — ED Notes (Signed)
Pt. Given a [place] sandwich and a coke

## 2015-08-04 NOTE — ED Notes (Signed)
No further seizure acitvity noted.  He arouses slowly and speaks and correctly tells me his name.  He denies pain or discomfort.  He follows commands with all extremities with ease.

## 2015-08-04 NOTE — ED Notes (Signed)
Bed: WA20 Expected date: 08/04/15 Expected time: 5:04 PM Means of arrival: Ambulance Comments: Seizures

## 2015-08-04 NOTE — ED Notes (Signed)
While at work today he was observed to have what appeared to be seizure activity.  EMS were notified--no meds given as yet.  His mother was notified and her request was for pt. To be taken to [facility] d/t hx of seizure disorder and Crohn's dis.  She further told EMS pt. Has "not had any medicines for two years because he doesn't have insurance".  He arrives slow to respond to verbal stimuli, but does arouse and follow commands.  He does spit out some blood-tinged saliva.

## 2015-08-04 NOTE — Telephone Encounter (Signed)
His mother called tonight on call. He is in the ER with seizure like activity and she tells me he's been spitting up blood for weeks.  She asked me to come see him.  I noted that his Hb 2-3 days ago was normal.  I have not been contacted by ER team. I explained that I am happy to see him in the hospital if he is admitted or if the ER team feels he needs urgent GI evaluation in the ER.

## 2015-08-04 NOTE — ED Notes (Signed)
Pt responding appropriately and asking staff for food. PA gave orders to give pt food/fluids.

## 2015-08-05 ENCOUNTER — Telehealth: Payer: Self-pay | Admitting: Gastroenterology

## 2015-08-05 MED ORDER — PANTOPRAZOLE SODIUM 40 MG PO TBEC
40.0000 mg | DELAYED_RELEASE_TABLET | Freq: Every day | ORAL | Status: DC
  Administered 2015-08-05: 40 mg via ORAL
  Filled 2015-08-05: qty 1

## 2015-08-05 MED ORDER — ONDANSETRON HCL 4 MG/2ML IJ SOLN
4.0000 mg | Freq: Once | INTRAMUSCULAR | Status: AC
  Administered 2015-08-05: 4 mg via INTRAVENOUS
  Filled 2015-08-05: qty 2

## 2015-08-05 MED ORDER — MORPHINE SULFATE (PF) 4 MG/ML IV SOLN
4.0000 mg | Freq: Once | INTRAVENOUS | Status: AC
  Administered 2015-08-05: 4 mg via INTRAVENOUS

## 2015-08-05 MED ORDER — PROMETHAZINE HCL 25 MG PO TABS: 25.0000 mg | ORAL_TABLET | Freq: Three times a day (TID) | ORAL | Status: DC | PRN

## 2015-08-05 MED ORDER — PANTOPRAZOLE SODIUM 20 MG PO TBEC
20.0000 mg | DELAYED_RELEASE_TABLET | Freq: Every day | ORAL | Status: DC
Start: 2015-08-05 — End: 2015-08-09

## 2015-08-05 MED ORDER — SUCRALFATE 1 GM/10ML PO SUSP
1.0000 g | Freq: Once | ORAL | Status: AC
  Administered 2015-08-05: 1 g via ORAL
  Filled 2015-08-05: qty 10

## 2015-08-05 MED ORDER — LORAZEPAM 1 MG PO TABS: 1.0000 mg | ORAL_TABLET | Freq: Three times a day (TID) | ORAL | Status: DC | PRN

## 2015-08-05 MED ORDER — SUCRALFATE 1 G PO TABS: 1.0000 g | ORAL_TABLET | Freq: Three times a day (TID) | ORAL | Status: DC

## 2015-08-05 MED ORDER — MORPHINE SULFATE (PF) 4 MG/ML IV SOLN
6.0000 mg | Freq: Once | INTRAVENOUS | Status: DC
  Filled 2015-08-05: qty 2

## 2015-08-05 MED ORDER — OXYCODONE-ACETAMINOPHEN 5-325 MG PO TABS: 1.0000 | ORAL_TABLET | Freq: Four times a day (QID) | ORAL | Status: DC | PRN

## 2015-08-05 NOTE — ED Provider Notes (Signed)
CSN: [id number]     Arrival date & time 08/04/15  1702 History   First MD Initiated Contact with Patient 08/04/15 1711     Chief Complaint  Patient presents with  . Seizures     (Consider location/radiation/quality/duration/timing/severity/associated sxs/prior Treatment) HPI Patient presents to the emergency department with pseudoseizures and he states that he spitting up blood.  The patient states that this is been happening intermittently for several weeks.  The patient has had pseudoseizures over the last few weeks at work.  The patient will initially will not talk to me, even though he is tracking me around the room and is able to move all extremities with purposeful movements and he does talk to the nurse on room.  The patient also had abdominal discomfort chronically along with this.  He is now spitting up blood mixed with mucus. .   Past Medical History  Diagnosis Date  . Ileitis   . ADHD (attention deficit hyperactivity disorder)   . Lymphoid hyperplasia, reactive   . Anxiety   . Marijuana abuse   . Gonorrhea 09/08/11  . Anal warts   . Pseudoseizures   . Chronic pain syndrome   . Crohn's disease (HCC)   . Depression   . Seizures [facility])    Past Surgical History  Procedure Laterality Date  . Foot surgery      bilateral  . Ankle surgery      bilateral   Family History  Problem Relation Age of Onset  . Colon cancer Neg Hx    Social History  Substance Use Topics  . Smoking status: Current Every Day Smoker -- 1.00 packs/day for 5 years    Types: Cigarettes  . Smokeless tobacco: Never Used  . Alcohol Use: No    Review of Systems  All other systems negative except as documented in the HPI. All pertinent positives and negatives as reviewed in the HPI.  Allergies  Vicodin and Dilaudid  Home Medications   Prior to Admission medications   Medication Sig Start Date End Date Taking? Authorizing Provider  oxyCODONE-acetaminophen (PERCOCET/ROXICET) 5-325 MG per tablet  Take 1 tablet by mouth every 4 (four) hours as needed for severe pain. Patient not taking: Reported on 08/01/2015 07/03/15   Derwood Kaplan, MD  promethazine (PHENERGAN) 25 MG tablet Take 1 tablet (25 mg total) by mouth every 6 (six) hours as needed for nausea or vomiting. Patient not taking: Reported on 08/01/2015 07/03/15   Ankit Nanavati, MD   BP 107/48 mmHg  Pulse 60  Temp(Src) 98.8 F (37.1 C) (Oral)  Resp 11  SpO2 100% Physical Exam  Constitutional: He is oriented to person, place, and time. He appears well-developed and well-nourished. No distress.  HENT:  Head: Normocephalic and atraumatic.  Mouth/Throat: Oropharynx is clear and moist.  Eyes: Pupils are equal, round, and reactive to light.  Neck: Normal range of motion. Neck supple.  Cardiovascular: Normal rate, regular rhythm and normal heart sounds.  Exam reveals no gallop and no friction rub.   No murmur heard. Pulmonary/Chest: Effort normal and breath sounds normal. No respiratory distress.  Abdominal: Soft. Bowel sounds are normal. He exhibits no distension. There is tenderness. There is no rebound and no guarding.  Musculoskeletal: He exhibits no edema.  Neurological: He is alert and oriented to person, place, and time. He exhibits normal muscle tone. Coordination normal.  Skin: Skin is warm and dry. No rash noted. No erythema.  Nursing note and vitals reviewed.   ED Course  Procedures (  including critical care time) Labs Review Labs Reviewed  COMPREHENSIVE METABOLIC PANEL - Abnormal; Notable for the following:    ALT 16 (*)    All other components within normal limits  URINALYSIS, ROUTINE W REFLEX MICROSCOPIC (NOT AT [facility]) - Abnormal; Notable for the following:    Color, Urine AMBER (*)    APPearance CLOUDY (*)    Hgb urine dipstick LARGE (*)    Leukocytes, UA MODERATE (*)    All other components within normal limits  URINE RAPID DRUG SCREEN, HOSP PERFORMED - Abnormal; Notable for the following:    Opiates  POSITIVE (*)    Cocaine POSITIVE (*)    Benzodiazepines POSITIVE (*)    All other components within normal limits  CBC WITH DIFFERENTIAL/PLATELET  URINE MICROSCOPIC-ADD ON    Imaging Review Ct Renal Stone Study  08/05/2015  CLINICAL DATA:  Flank and mid abdominal pain. EXAM: CT ABDOMEN AND PELVIS WITHOUT CONTRAST TECHNIQUE: Multidetector CT imaging of the abdomen and pelvis was performed following the standard protocol without IV contrast. COMPARISON:  Radiographs 08/01/2015, CT 11/30/2010 FINDINGS: Lower chest:  The included lung bases are clear. Liver: Limited given lack of contrast, no abnormality is seen. Hepatobiliary: Gallbladder partially distended, no calcified gallstone. No biliary dilatation. Pancreas: No ductal dilatation or surrounding inflammation. Spleen: Normal. Adrenal glands: No nodule. Kidneys: Symmetric in size without stones or hydronephrosis. There is no perinephric stranding. Both ureters are decompressed without stones along the course. Stomach/Bowel: Stomach physiologically distended. There are no dilated or thickened small bowel loops. Small volume of stool throughout the colon without colonic wall thickening. Detailed bowel evaluation limited given lack of contrast and paucity of intra-abdominal fat. The appendix is normal. Vascular/Lymphatic: No retroperitoneal adenopathy. Abdominal aorta is normal in caliber. Reproductive: Prostate gland normal in size. Bladder: Physiologically distended without wall thickening. Other: No free air, free fluid, or intra-abdominal fluid collection. Musculoskeletal: There are no acute or suspicious osseous abnormalities. IMPRESSION: No renal stones or obstructive uropathy. No acute abnormality in the abdomen/pelvis. Electronically Signed   By: Rubye Oaks M.D.   On: 08/05/2015 01:19   I have personally reviewed and evaluated these images and lab results as part of my medical decision-making.   the mother.  Patient explained that he needs  to follow with neurology and GI spoke with GI, who agrees.  Follow up with him closely.  The blood is mixed with mucus and small amount of vomitus.  I have not noted a large amount of blood in any of these episodes.  The patient had 2 pseudoseizures here in the emergency department.  He was talking during the seizures.  Patient has had a [place] sandwich and a Coke.  The mother feels that he needs to be admitted and I advised her that there is no admission criteria at this point.the patient does have a history of pseudoseizures.  The patient has chronic abdominal pain.  He also has a history of anxiety and depression   Charlestine Night, PA-C 08/05/15 1610  Cathren Laine, MD 08/07/15 [phone]

## 2015-08-05 NOTE — Telephone Encounter (Signed)
Dr. Christella Hartigan, Please advise. Would you like me to schedule patient apt with APP? Your next office visit opening is not until December.  Thank you

## 2015-08-05 NOTE — Telephone Encounter (Signed)
He is a previous Dr. Juanda Chance patient.  He is not having ongoing GI bleeding that i can tell (Hb normal and described as "spitting up blood" around the time of pseudoseizures).  He can be scheduled with first available provider (md or extender).  thanks

## 2015-08-05 NOTE — Discharge Instructions (Signed)
Your testing here today did not show any significant abnormalities he will need to follow-up with the neurologist provided and a GI doctor, Dr. Christella Hartigan is expecting her call tomorrow morning to set up this appointment.  The CT scan did not show any abnormalities.

## 2015-08-07 NOTE — Telephone Encounter (Signed)
Called patient to schedule office visit. Msg left to contact office.

## 2015-08-09 ENCOUNTER — Ambulatory Visit (INDEPENDENT_AMBULATORY_CARE_PROVIDER_SITE_OTHER): Payer: Medicaid Other | Admitting: Gastroenterology

## 2015-08-09 ENCOUNTER — Other Ambulatory Visit (INDEPENDENT_AMBULATORY_CARE_PROVIDER_SITE_OTHER): Payer: Self-pay

## 2015-08-09 ENCOUNTER — Encounter: Payer: Self-pay | Admitting: Gastroenterology

## 2015-08-09 VITALS — BP 114/80 | HR 80 | Ht 71.0 in | Wt 157.0 lb

## 2015-08-09 DIAGNOSIS — R110 Nausea: Secondary | ICD-10-CM

## 2015-08-09 DIAGNOSIS — K920 Hematemesis: Secondary | ICD-10-CM

## 2015-08-09 DIAGNOSIS — R1084 Generalized abdominal pain: Secondary | ICD-10-CM

## 2015-08-09 DIAGNOSIS — K529 Noninfective gastroenteritis and colitis, unspecified: Secondary | ICD-10-CM

## 2015-08-09 LAB — SEDIMENTATION RATE: Sed Rate: 5 mm/hr (ref 0–22)

## 2015-08-09 LAB — HIGH SENSITIVITY CRP: CRP HIGH SENSITIVITY: 2.04 mg/L (ref 0.000–5.000)

## 2015-08-09 MED ORDER — DICYCLOMINE HCL 10 MG PO CAPS: 10.0000 mg | ORAL_CAPSULE | Freq: Three times a day (TID) | ORAL | Status: DC

## 2015-08-09 MED ORDER — SUCRALFATE 1 G PO TABS: 1.0000 g | ORAL_TABLET | Freq: Four times a day (QID) | ORAL | Status: DC

## 2015-08-09 MED ORDER — OMEPRAZOLE 40 MG PO CPDR: 40.0000 mg | DELAYED_RELEASE_CAPSULE | Freq: Every day | ORAL | Status: DC

## 2015-08-09 NOTE — Telephone Encounter (Signed)
Patients mother called back and scheduled appointment for hospital follow up on 08-09-15.

## 2015-08-09 NOTE — Patient Instructions (Addendum)
Please go to the basement level to have your labs drawn.  We sent prescriptions to [facility] [address]    Dennis Bast have been scheduled for a CT scan of the abdomen and pelvis at [facility] Radiology, first floor. Go to patient registrattion.  You are scheduled on  Thursday 08-15-2015 at 3:00 PM . You should arrive at 1:45 PM to your appointment time for registration. Please follow the written instructions below on the day of your exam:  WARNING: IF YOU ARE ALLERGIC TO IODINE/X-RAY DYE, PLEASE NOTIFY RADIOLOGY IMMEDIATELY AT [phone]! YOU WILL BE GIVEN A 13 HOUR PREMEDICATION PREP.  1) Do not eat or drink anything after11:00 am  (4 hours prior to your test)   You may take any medications as prescribed with a small amount of water except for the following: Metformin, Glucophage, Glucovance, Avandamet, Riomet, Fortamet, Actoplus Met, Janumet, Glumetza or Metaglip. The above medications must be held the day of the exam AND 48 hours after the exam.  If you have any questions regarding your exam or if you need to reschedule, you may call the CT department at [phone] between the hours of 8:00 am and 5:00 pm, Monday-Friday.  We will be referring you to the Pain management at [facility] on [address]  I will be faxing them the note from today.  You will be hearing from them about an appointment.  We have you on a list to call to schedule you for the colonoscopy and Endoscopy with Dr. Havery Moros.   ________________________________________________________________________

## 2015-08-12 ENCOUNTER — Encounter: Payer: Self-pay | Admitting: Gastroenterology

## 2015-08-12 ENCOUNTER — Ambulatory Visit: Payer: Medicaid Other | Admitting: Neurology

## 2015-08-12 DIAGNOSIS — K529 Noninfective gastroenteritis and colitis, unspecified: Secondary | ICD-10-CM | POA: Insufficient documentation

## 2015-08-12 DIAGNOSIS — R1084 Generalized abdominal pain: Secondary | ICD-10-CM | POA: Insufficient documentation

## 2015-08-12 LAB — HIV ANTIBODY (ROUTINE TESTING W REFLEX): HIV: REACTIVE — AB

## 2015-08-12 LAB — HIV 1/2 CONFIRMATION
HIV 1 ANTIBODY: POSITIVE — AB
HIV 2 AB: NEGATIVE

## 2015-08-13 NOTE — Progress Notes (Signed)
Agree with assessment and plan. Based on prior colonoscopy raises the concern for Crohns. Recommend full evaluation to more definitively evaluate for Crohns given ongoing symptoms. Agree will not prescribe narcotics. Recommend avoidance of all NSAIDs otherwise. If he has Crohns, management will be aimed at treating this and hopefully with help relieve his pain.

## 2015-08-13 NOTE — Progress Notes (Signed)
08/09/2015 Pedro Owens [id number] August 06, 1991   HISTORY OF PRESENT ILLNESS:  This is a 24 year old male who is known to Dr. Juanda Chance on a couple of occasions 5 years ago.  Care will be assumed by Dr. Adela Lank in her absence.  He is a Chiropodist with PMH of pseudoseizures, ADHD, anxiety, chronic pain, substance abuse (had UDS positive for cocaine, opiates, and benzos just 9 days ago).  He had a colonoscopy indicating ileitis from biopsies on 08/03/10.  The terminal ileum biopsy showed nodularity and focal active ileitis.  After a 5 year absence he presents to our office today with his mother with several complaints.  Mom gives most of the history as patient does not provide much detail.  Has diffuse mid-lower abdominal pain that has been going on for quite some time.  Also diffuse abdominal bloating.  Denies any diarrhea.  Says that some constipation if anything but does not focus on bowel issues.  Denies dark or bloody stool.  One of the largest complaints is of hematemesis.  Mom says that when he has pseudoseizures he coughs up/vomits up/brings up/spits up material with red blood "like the exorcist".  As I am sitting in the room the patient acted like he brought up stuff into his mouth and when he opened his mouth there was red blood present.  CT abdomen and pelvis without contrast on 08/05/2015 did not show any abnormalities.   CBC, CMP, lipase are normal.  FOBT negative.  Patient requested pain medication and seemed very upset about this when I declined.   Past Medical History  Diagnosis Date  . Ileitis   . ADHD (attention deficit hyperactivity disorder)   . Lymphoid hyperplasia, reactive   . Anxiety   . Marijuana abuse   . Gonorrhea 09/08/11  . Anal warts   . Pseudoseizures   . Chronic pain syndrome   . Crohn's disease (HCC)   . Depression   . Seizures [facility])    Past Surgical History  Procedure Laterality Date  . Foot surgery Bilateral   . Ankle surgery  Bilateral     reports that he has been smoking Cigarettes.  He has a 5 pack-year smoking history. He has never used smokeless tobacco. He reports that he drinks alcohol. He reports that he does not use illicit drugs. family history includes Diabetes in his maternal uncle and mother; Heart disease in his maternal grandmother; Hyperlipidemia in his mother; Hypertension in his mother; Hypothyroidism in his mother; Irritable bowel syndrome in his mother; Leukemia in his other. There is no history of Colon cancer. Allergies  Allergen Reactions  . Vicodin [Hydrocodone-Acetaminophen] Nausea And Vomiting  . Dilaudid [Hydromorphone Hcl] Hives and Rash      Outpatient Encounter Prescriptions as of 08/09/2015  Medication Sig  . LORazepam (ATIVAN) 1 MG tablet Take 1 tablet (1 mg total) by mouth every 8 (eight) hours as needed for anxiety or seizure.  Marland Kitchen oxyCODONE-acetaminophen (PERCOCET/ROXICET) 5-325 MG tablet Take 1 tablet by mouth every 6 (six) hours as needed for severe pain.  . promethazine (PHENERGAN) 25 MG tablet Take 1 tablet (25 mg total) by mouth every 8 (eight) hours as needed for nausea or vomiting.  . sucralfate (CARAFATE) 1 G tablet Take 1 tablet (1 g total) by mouth 4 (four) times daily.  . [DISCONTINUED] sucralfate (CARAFATE) 1 G tablet Take 1 tablet (1 g total) by mouth 4 (four) times daily -  with meals and at bedtime.  . dicyclomine (  BENTYL) 10 MG capsule Take 1 capsule (10 mg total) by mouth 3 (three) times daily before meals.  Marland Kitchen omeprazole (PRILOSEC OTC) 20 MG tablet Take 20 mg by mouth daily.  Marland Kitchen omeprazole (PRILOSEC) 40 MG capsule Take 1 capsule (40 mg total) by mouth daily.  . [DISCONTINUED] dicyclomine (BENTYL) 10 MG capsule Take 1 capsule (10 mg total) by mouth 3 (three) times daily before meals.  . [DISCONTINUED] pantoprazole (PROTONIX) 20 MG tablet Take 1 tablet (20 mg total) by mouth daily.   No facility-administered encounter medications on file as of 08/09/2015.    REVIEW  OF SYSTEMS  : All other systems reviewed and negative except where noted in the History of Present Illness.  PHYSICAL EXAM: BP 114/80 mmHg  Pulse 80  Ht 5\' 11"  (1.803 m)  Wt 157 lb (71.215 kg)  BMI 21.91 kg/m2 General:  Well developed AA male in no acute distress Head: Normocephalic and atraumatic Eyes: Sclerae anicteric, conjunctiva pink. Ears: Normal auditory acuity Lungs: Clear throughout to auscultation Heart: Regular rate and rhythm Abdomen: Soft, non-distended.  Normal bowel sounds.  Patient reports diffuse TTP but abdominal exam is benign. Rectal: Will be done at the time of colonoscopy. Musculoskeletal: Symmetrical with no gross deformities  Skin: No lesions on visible extremities Extremities: No edema  Neurological: Alert oriented x 4, grossly non-focal Psychological:  Alert and cooperative. Normal mood and affect  ASSESSMENT AND PLAN: -24 year old male with history of focal ileitis on biopsies from a colonoscopy on 08/03/10.  No follow-up since that time.  Now presents with complaints of abdominal pain, bloating, hematemesis.  Discussed with Dr. Adela Lank.  Will plan for CT enterography as well as EGD and colonoscopy to hopefully lay these issues to rest regarding hematemesis and possible Crohn's.  Will check labs including sed rate, CRP as well as HIV.  Patient requesting pain medication, which I declined to prescribe so mother requested referral to pain management.  I explained that they do not usually treat abdominal pain of unknown source, but she is persistent with request.  Will refer to pain management in [place].  Will prescribe Bentyl 10 mg TID.  I will have him increase his omeprazole to 40 mg daily and he will continue the carafate 4 times daily for now.   CC:  No ref. provider found

## 2015-08-14 ENCOUNTER — Encounter: Payer: Self-pay | Admitting: Neurology

## 2015-08-14 ENCOUNTER — Telehealth: Payer: Self-pay | Admitting: Infectious Diseases

## 2015-08-14 ENCOUNTER — Ambulatory Visit (INDEPENDENT_AMBULATORY_CARE_PROVIDER_SITE_OTHER): Payer: Self-pay | Admitting: Neurology

## 2015-08-14 VITALS — BP 120/90 | HR 76 | Resp 16 | Ht 71.75 in | Wt 157.0 lb

## 2015-08-14 DIAGNOSIS — Z113 Encounter for screening for infections with a predominantly sexual mode of transmission: Secondary | ICD-10-CM

## 2015-08-14 DIAGNOSIS — B20 Human immunodeficiency virus [HIV] disease: Secondary | ICD-10-CM

## 2015-08-14 DIAGNOSIS — Z79899 Other long term (current) drug therapy: Secondary | ICD-10-CM

## 2015-08-14 DIAGNOSIS — R569 Unspecified convulsions: Secondary | ICD-10-CM

## 2015-08-14 DIAGNOSIS — F329 Major depressive disorder, single episode, unspecified: Secondary | ICD-10-CM

## 2015-08-14 DIAGNOSIS — F32A Depression, unspecified: Secondary | ICD-10-CM | POA: Insufficient documentation

## 2015-08-14 DIAGNOSIS — F449 Dissociative and conversion disorder, unspecified: Secondary | ICD-10-CM | POA: Insufficient documentation

## 2015-08-14 HISTORY — DX: Unspecified convulsions: R56.9

## 2015-08-14 MED ORDER — PAROXETINE HCL 20 MG PO TABS: 20.0000 mg | ORAL_TABLET | Freq: Every day | ORAL | Status: DC

## 2015-08-14 NOTE — Telephone Encounter (Signed)
Pt called for new HIV+ test.  Left vague message for pt to call me back via [facility] operator.  Will refer him to state DIS as well since I was not able to speak with him

## 2015-08-14 NOTE — Patient Instructions (Addendum)
1. Schedule for 1-hour sleep-deprived EEG 2. Once [facility] discount approved, schedule MRI brain with and without contrast 3. Start Paxil 20mg  daily 4. Refer to Behavioral Medicine for psychiatry and psychotherapy 5. As per [place] driving laws, no driving after a seizure until 6 months event-free 6. Follow-up in 3-4 weeks
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
## 2015-08-14 NOTE — Telephone Encounter (Signed)
Spoke with pt He will come to [facility] in AM for repeat testing

## 2015-08-15 ENCOUNTER — Encounter (HOSPITAL_COMMUNITY): Payer: Self-pay

## 2015-08-15 ENCOUNTER — Telehealth: Payer: Self-pay | Admitting: Gastroenterology

## 2015-08-15 ENCOUNTER — Ambulatory Visit (HOSPITAL_COMMUNITY)
Admission: RE | Admit: 2015-08-15 | Discharge: 2015-08-15 | Disposition: A | Discharge status: Home / Self Care | Payer: Self-pay | Source: Ambulatory Visit | Attending: Gastroenterology | Admitting: Gastroenterology

## 2015-08-15 DIAGNOSIS — R1084 Generalized abdominal pain: Secondary | ICD-10-CM

## 2015-08-15 DIAGNOSIS — R109 Unspecified abdominal pain: Secondary | ICD-10-CM | POA: Insufficient documentation

## 2015-08-15 DIAGNOSIS — K920 Hematemesis: Secondary | ICD-10-CM

## 2015-08-15 DIAGNOSIS — R112 Nausea with vomiting, unspecified: Secondary | ICD-10-CM | POA: Insufficient documentation

## 2015-08-15 DIAGNOSIS — K529 Noninfective gastroenteritis and colitis, unspecified: Secondary | ICD-10-CM

## 2015-08-15 MED ORDER — IOHEXOL 300 MG/ML  SOLN
100.0000 mL | Freq: Once | INTRAMUSCULAR | Status: AC | PRN
  Administered 2015-08-15: 100 mL via INTRAVENOUS

## 2015-08-15 NOTE — Telephone Encounter (Signed)
Patient contacted and given results of CT enterography.  He had already been contacted by ID regarding positive HIV study and is going to have more labs performed tomorrow.  ID to follow-up with those.

## 2015-08-22 ENCOUNTER — Other Ambulatory Visit: Payer: Self-pay

## 2015-08-23 ENCOUNTER — Telehealth (HOSPITAL_COMMUNITY): Payer: Self-pay

## 2015-08-23 NOTE — Telephone Encounter (Signed)
Call [facility] to see if pt has followed up w/ID regarding 43 dx.  Informed no visits seen.

## 2015-09-18 ENCOUNTER — Ambulatory Visit: Payer: Self-pay | Admitting: Neurology

## 2015-11-26 ENCOUNTER — Telehealth: Payer: Self-pay | Admitting: Gastroenterology

## 2015-11-27 NOTE — Telephone Encounter (Signed)
Patient's mother states he now has insurance and wants to schedule an OV again. She states he will come and complete the labs requested at previous OV. Scheduled with Dr. Adela Lank on 12/30/15. She understands that patient should seek care at ED or Urgent Care for immediate increase in symptoms.

## 2015-12-10 ENCOUNTER — Telehealth: Payer: Self-pay

## 2015-12-10 NOTE — Telephone Encounter (Signed)
Patients Mother called to set up appointment.  Patient has a busy work schedule and would need to schedule at least two weeks in advance.   Date and time given. Information given regarding documents needed to qualify for financial eligibility.  Laurell Josephs, RN

## 2015-12-18 ENCOUNTER — Other Ambulatory Visit: Payer: Self-pay

## 2015-12-25 ENCOUNTER — Other Ambulatory Visit: Payer: Self-pay

## 2015-12-30 ENCOUNTER — Ambulatory Visit: Payer: Self-pay | Admitting: Gastroenterology

## 2015-12-31 ENCOUNTER — Ambulatory Visit: Payer: Self-pay

## 2016-02-12 ENCOUNTER — Ambulatory Visit: Payer: Self-pay | Admitting: Neurology

## 2016-02-28 ENCOUNTER — Emergency Department (HOSPITAL_COMMUNITY)
Admission: EM | Admit: 2016-02-28 | Discharge: 2016-02-28 | Disposition: A | Discharge status: Home / Self Care | Payer: BLUE CROSS/BLUE SHIELD | Attending: Emergency Medicine | Admitting: Emergency Medicine

## 2016-02-28 ENCOUNTER — Encounter (HOSPITAL_COMMUNITY): Payer: Self-pay | Admitting: Emergency Medicine

## 2016-02-28 ENCOUNTER — Emergency Department (HOSPITAL_COMMUNITY): Payer: BLUE CROSS/BLUE SHIELD

## 2016-02-28 DIAGNOSIS — R519 Headache, unspecified: Secondary | ICD-10-CM

## 2016-02-28 DIAGNOSIS — R51 Headache: Secondary | ICD-10-CM | POA: Insufficient documentation

## 2016-02-28 DIAGNOSIS — Z8619 Personal history of other infectious and parasitic diseases: Secondary | ICD-10-CM | POA: Insufficient documentation

## 2016-02-28 DIAGNOSIS — Z79899 Other long term (current) drug therapy: Secondary | ICD-10-CM | POA: Insufficient documentation

## 2016-02-28 DIAGNOSIS — R109 Unspecified abdominal pain: Secondary | ICD-10-CM | POA: Diagnosis not present

## 2016-02-28 DIAGNOSIS — F329 Major depressive disorder, single episode, unspecified: Secondary | ICD-10-CM | POA: Insufficient documentation

## 2016-02-28 DIAGNOSIS — R258 Other abnormal involuntary movements: Secondary | ICD-10-CM | POA: Diagnosis not present

## 2016-02-28 DIAGNOSIS — F419 Anxiety disorder, unspecified: Secondary | ICD-10-CM | POA: Diagnosis not present

## 2016-02-28 DIAGNOSIS — F17210 Nicotine dependence, cigarettes, uncomplicated: Secondary | ICD-10-CM | POA: Insufficient documentation

## 2016-02-28 DIAGNOSIS — Z8719 Personal history of other diseases of the digestive system: Secondary | ICD-10-CM | POA: Diagnosis not present

## 2016-02-28 DIAGNOSIS — G894 Chronic pain syndrome: Secondary | ICD-10-CM | POA: Insufficient documentation

## 2016-02-28 DIAGNOSIS — R569 Unspecified convulsions: Secondary | ICD-10-CM | POA: Diagnosis present

## 2016-02-28 LAB — BASIC METABOLIC PANEL
Anion gap: 9 (ref 5–15)
BUN: 17 mg/dL (ref 6–20)
CALCIUM: 8.9 mg/dL (ref 8.9–10.3)
CHLORIDE: 108 mmol/L (ref 101–111)
CO2: 24 mmol/L (ref 22–32)
CREATININE: 0.95 mg/dL (ref 0.61–1.24)
GFR calc non Af Amer: >60 mL/min (ref >60)
Glucose, Bld: 82 mg/dL (ref 65–99)
Potassium: 3.9 mmol/L (ref 3.5–5.1)
SODIUM: 141 mmol/L (ref 135–145)

## 2016-02-28 MED ORDER — DIVALPROEX SODIUM 250 MG PO DR TAB
1000.0000 mg | DELAYED_RELEASE_TABLET | Freq: Once | ORAL | Status: DC
Start: 2016-02-28 — End: 2016-02-28
  Filled 2016-02-28: qty 4

## 2016-02-28 MED ORDER — DIVALPROEX SODIUM 500 MG PO DR TAB: 500.0000 mg | DELAYED_RELEASE_TABLET | Freq: Two times a day (BID) | ORAL | Status: DC

## 2016-02-28 NOTE — ED Notes (Signed)
Pt. refused to use a wheelchair at discharge.

## 2016-02-28 NOTE — ED Notes (Signed)
Pt family requesting to speak with MD regarding discharge plan. Family concerned about patient being discharged and would like to speak with neurologist. MD Adela Lank notified and stated he would be to bedside soon.

## 2016-02-28 NOTE — ED Notes (Signed)
Patient transported to CT 

## 2016-02-28 NOTE — ED Provider Notes (Signed)
I have personally seen and examined the patient.  I have discussed the plan of care with the resident.  I have reviewed the documentation on PMH/FH/Soc. History.  I have reviewed the documentation of the resident and agree.   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 Patient had one episode earlier today that they felt like was atypical for his prior. Family is concerned that there unable to make it to the initial scheduled EEG about 6 months ago due to financial reasons. The EEG is now scheduled 2 months from now and they feel like he cannot make it longer needs admission with overnight EEG. Discussed that as patient is back to baseline has no history of seizures and is currently not have any symptoms that this should be best thing care of as an outpatient. Patient is awake and alert and frustrated with the decision that I'm not going to keep him in the hospital. When discussed with the family that they should not encourage his behavior he became more irate. The patient's last visit with neurology he was taken off of seizure medications until his EEG was completed. We'll continue to hold his Depakote.  Patient's family continues to be upset about them not being placed in observation. Mom held the discharge paperwork that had a Depakote prescription and stated that she was taking it to [facility]. Discussed that the prescription was a miscommunication and that I am happy to dispose of the prescription for her. She continued to be upset and then left. I reiterated that the best course of action would be to follow-up with their  neurologist and have the outpatient EEG.    Melene Plan, DO 02/29/16 0000

## 2016-02-28 NOTE — ED Notes (Signed)
Pt here from home with seizures pt had 2 at home according to roommates and 2 with EMS pt received 5 mg of versed with ems , pt has been out seizure meds for 6 months or longer

## 2016-02-28 NOTE — ED Provider Notes (Signed)
CSN: [id number]     Arrival date & time 02/28/16  1610 History   First MD Initiated Contact with Patient 02/28/16 1614     Chief Complaint  Patient presents with  . Seizures     (Consider location/radiation/quality/duration/timing/severity/associated sxs/prior Treatment) Patient is a 25 y.o. male presenting with general illness. The history is provided by the patient.  Illness Location:  Seizure Severity:  Moderate Onset quality:  Sudden Duration: unknown. Timing:  Sporadic Progression:  Resolved Chronicity:  Recurrent Context:  History of seizures, most recent neurology appointment showed concern for non-epileptiform activity. He was supposed to have follow-up EEG and MRI, lost to follow-up. Today he reports he was asleep, witnessed by friends to have 2 seizures. No bowel or bladder incontinence. No tongue biting. Does endorse chronic headaches, getting worse over the last 6 months. History of Crohn's. No recent vomiting or diarrhea. Associated symptoms: abdominal pain (c/w baseline (Crohns)), fatigue and headaches   Associated symptoms: no chest pain, no cough, no diarrhea, no fever, no nausea, no shortness of breath and no vomiting     Past Medical History  Diagnosis Date  . Ileitis   . ADHD (attention deficit hyperactivity disorder)   . Lymphoid hyperplasia, reactive   . Anxiety   . Marijuana abuse   . Gonorrhea 09/08/11  . Anal warts   . Pseudoseizures   . Chronic pain syndrome   . Crohn's disease (HCC)   . Depression   . Seizures [facility])    Past Surgical History  Procedure Laterality Date  . Foot surgery Bilateral 2005    Screw & Pins  . Ankle surgery Bilateral 2005    Screw & Pins   Family History  Problem Relation Age of Onset  . Colon cancer Neg Hx   . Diabetes Maternal Uncle   . Diabetes Mother   . Irritable bowel syndrome Mother   . Hypertension Mother   . Hyperlipidemia Mother   . Hypothyroidism Mother   . Heart disease Maternal Grandmother   .  Leukemia Other     maternal greatgrandmother   Social History  Substance Use Topics  . Smoking status: Current Every Day Smoker -- 1.00 packs/day for 5 years    Types: Cigarettes  . Smokeless tobacco: Never Used  . Alcohol Use: 0.0 oz/week    0 Standard drinks or equivalent per week     Comment: Occ    Review of Systems  Constitutional: Positive for fatigue. Negative for fever and chills.  Respiratory: Negative for cough and shortness of breath.   Cardiovascular: Negative for chest pain.  Gastrointestinal: Positive for abdominal pain (c/w baseline (Crohns)). Negative for nausea, vomiting and diarrhea.  Musculoskeletal: Negative for back pain and neck pain.  Neurological: Positive for seizures and headaches. Negative for facial asymmetry, speech difficulty, weakness, light-headedness and numbness.  All other systems reviewed and are negative.     Allergies  Vicodin and Dilaudid  Home Medications   Prior to Admission medications   Medication Sig Start Date End Date Taking? Authorizing Provider  dicyclomine (BENTYL) 10 MG capsule Take 1 capsule (10 mg total) by mouth 3 (three) times daily before meals. Patient not taking: Reported on 08/14/2015 08/09/15   Shanda Bumps D Zehr, PA-C  LORazepam (ATIVAN) 1 MG tablet Take 1 tablet (1 mg total) by mouth every 8 (eight) hours as needed for anxiety or seizure. Patient not taking: Reported on 08/14/2015 08/05/15   Charlestine Night, PA-C  omeprazole (PRILOSEC) 40 MG capsule Take 1 capsule (40 mg  total) by mouth daily. 08/09/15   Princella Pellegrini Zehr, PA-C  oxyCODONE-acetaminophen (PERCOCET/ROXICET) 5-325 MG tablet Take 1 tablet by mouth every 6 (six) hours as needed for severe pain. Patient not taking: Reported on 08/14/2015 08/05/15   Charlestine Night, PA-C  PARoxetine (PAXIL) 20 MG tablet Take 1 tablet (20 mg total) by mouth daily. 08/14/15   Van Clines, MD  promethazine (PHENERGAN) 25 MG tablet Take 1 tablet (25 mg total) by mouth every 8  (eight) hours as needed for nausea or vomiting. Patient not taking: Reported on 08/14/2015 08/05/15   Charlestine Night, PA-C  sucralfate (CARAFATE) 1 G tablet Take 1 tablet (1 g total) by mouth 4 (four) times daily. 08/09/15   Jessica D Zehr, PA-C   BP 124/88 mmHg  Pulse 72  Temp(Src) 98.6 F (37 C) (Oral)  Resp 15  SpO2 96% Physical Exam  Constitutional: He is oriented to person, place, and time. He appears well-developed and well-nourished. No distress.  HENT:  Head: Normocephalic and atraumatic.  Mouth/Throat: Mucous membranes are not dry.  Eyes: EOM are normal. Pupils are equal, round, and reactive to light.  Neck: No rigidity. Normal range of motion present.  Cardiovascular: Normal rate, regular rhythm and intact distal pulses.   Pulmonary/Chest: Effort normal. No respiratory distress. He has no wheezes. He has no rhonchi.  Abdominal: Soft. There is no tenderness. There is no rebound, no guarding, no CVA tenderness, no tenderness at McBurney's point and negative Murphy's sign.  Musculoskeletal: He exhibits no edema.  Neurological: He is alert and oriented to person, place, and time. He has normal strength and normal reflexes. No cranial nerve deficit or sensory deficit. He displays a negative Romberg sign. Coordination normal. GCS eye subscore is 4. GCS verbal subscore is 5. GCS motor subscore is 6.  Skin: Skin is warm and dry.    ED Course  Procedures (including critical care time) Labs Review Labs Reviewed  BASIC METABOLIC PANEL    Imaging Review Ct Head Wo Contrast  02/28/2016  CLINICAL DATA:  Several seizures today, history of seizures EXAM: CT HEAD WITHOUT CONTRAST TECHNIQUE: Contiguous axial images were obtained from the base of the skull through the vertex without intravenous contrast. COMPARISON:  12/25/2008 FINDINGS: Brain: No intracranial hemorrhage, mass effect or midline shift. Ventricular size is stable from prior exam. No hydrocephalus. No mass lesion is noted  on this unenhanced scan. Vascular: No hyperdense vessel or unexpected calcification. Skull: Negative for fracture or focal lesion. Sinuses/Orbits: There is mucosal thickening with air-fluid level in right maxillary sinus. Mucosal thickening with partial opacification bilateral ethmoid air cells. The mastoid air cells are unremarkable. Other: None IMPRESSION: No acute intracranial abnormality. Mucosal thickening with air-fluid level right maxillary sinus. Mucosal thickening with partial opacification bilateral ethmoid air cells. Electronically Signed   By: Natasha Mead M.D.   On: 02/28/2016 17:01   I have personally reviewed and evaluated these images and lab results as part of my medical decision-making.   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 Resolved at the time of arrival to emergency department. Patient reporting headache for several years, getting progressively worse over the last 6 months. Denies vision changes. Hasn't had a seizure in over 6 months, not on any seizure medications. No focal neurological deficits on exam. Patient does have some abdominal discomfort; no peritoneal signs on exam. Doubt acute abdomen. No fevers or chills. Denies any infectious symptoms. No nausea vomiting or diarrhea.  Given the patient's history of Crohn's, getting a BMP. Given the headache is getting progressively worse we'll get a CT of his head.  CT of the head didn't show any acute abnormalities. No evidence  of any bleeds or masses. BMP doesn't show any electrolyte abnormalities. The patient's mother arrived. Is requesting evaluation by neurology. I explained multiple times that the patient has a history of pseudoseizures and has no documented history of seizures. He has not been taking any antiepileptics for some time. Considered starting the patient back on his prior antiepileptic; however decided that it would be more appropriate to have the patient follow-up with his neurologist for that decision to be made to start him back on his AED. The patient and his mother were visibly upset that we were not to be admitting the patient. Contacted neurology. They agree with our assessment and plan on having the patient follow up outpatient with his neurologist. I explained this again to the patient and mother. They continued to be upset. My attending physician was notified. This was further explained that since the patient is well-appearing and in no acute distress at this time with no focal neurological deficits, with a normal CAT scan and normal electrolytes, we would be discharging the patient at this time with neurology follow-up. Strongly encouraged him to pursue having the EEG and MRI of his brain as had been previously scheduled with his outpatient neurologist.  Strict return precautions provided. Patient discharged in stable condition.  Lindalou Hose, MD 02/28/16 2352  Melene Plan, DO 02/28/16 2357

## 2016-03-04 ENCOUNTER — Telehealth: Payer: Self-pay | Admitting: Gastroenterology

## 2016-03-04 ENCOUNTER — Telehealth: Payer: Self-pay | Admitting: Family Medicine

## 2016-03-04 DIAGNOSIS — R569 Unspecified convulsions: Secondary | ICD-10-CM

## 2016-03-04 NOTE — Telephone Encounter (Signed)
Spoke with patient's mother and she states he needs to follow up to "see if he has Crohn's" States he was seen in ED last week for a seizure and was told the f/u at GI. Scheduled patient on 05/06/16 with Dr. Adela Lank.

## 2016-03-04 NOTE — Telephone Encounter (Signed)
Patients mother called and stated that patient needs to be seen sooner than July. States he had a recent ED visit for seizures where he had a total of 8 seizures on that one day. She states that he wasn't able to keep his pervious f/u & eeg appts due to ins issues. States that the now has ins and would like an appt. Should he be seen sooner or wait for Karel Jarvis? Should he proceed with EEG first since that hasn't been done?

## 2016-03-05 NOTE — Telephone Encounter (Signed)
Spoke with patients mother again. Agreeable to proceed with testing first. I will get him set up for MRI & EEG. They are going to fax a copy of his ins card so we can scan in to the chart.

## 2016-03-05 NOTE — Telephone Encounter (Signed)
Left msg for Pedro Owens to return my call.   MRI brain scheduled on 03/19/16 @ [facility] 12:45 pm. Sleep deprived eeg scheduled for 03/12/16 @ 7:30 am.

## 2016-03-05 NOTE — Telephone Encounter (Signed)
Doesn't look like he has done MRI or EEG's which were previously recommended.  Get those scheduled first and can see Dr. Karel Jarvis when she gets back, after testing is completed.  Did he go to psychology as she had recommended?

## 2016-03-06 NOTE — Telephone Encounter (Signed)
Called patients mother again, I did notify her patients upcoming appts. Still awaiting his ins card so I can check for PA mri scan. She states that it will be faxed to [place] today. Also advised to make sure he brings his ins card with him to EEG appt next week, to be scanned in to our system.

## 2016-03-11 ENCOUNTER — Telehealth: Payer: Self-pay

## 2016-03-11 NOTE — Telephone Encounter (Signed)
Patient's Mother  contacted regarding new intake appointment. Date and time given. I am not really sue if he is insured. Mother stated he has a $50.00 copayment and concerned he may not be able to pay full balance at visit.  She was informed he can make a partial payment.   Laurell Josephs, RN

## 2016-03-12 ENCOUNTER — Ambulatory Visit (INDEPENDENT_AMBULATORY_CARE_PROVIDER_SITE_OTHER): Payer: BLUE CROSS/BLUE SHIELD | Admitting: Neurology

## 2016-03-12 ENCOUNTER — Telehealth: Payer: Self-pay | Admitting: Neurology

## 2016-03-12 DIAGNOSIS — R569 Unspecified convulsions: Secondary | ICD-10-CM | POA: Diagnosis not present

## 2016-03-12 NOTE — Procedures (Signed)
TECHNICAL SUMMARY:  A multichannel referential and bipolar montage EEG using the standard international 10-20 system was performed on the sleep deprived patient described as awake, drowsy and asleep.  Recording time was 1 hour 1 minute.  The dominant background activity consists of 10 hertz activity seen most prominantly over the posterior head region.  The backgound activity is reactive to eye opening and closing procedures.  Low voltage fast (beta) activity is distributed symmetrically and maximally over the anterior head regions.  ACTIVATION:  Stepwise photic stimulation at 4-20 flashes per second was performed and did not elicit any abnormal waveforms.  Hyperventilation was performed for 3 minutes with good patient effort and produced no changes in the background activity.  EPILEPTIFORM ACTIVITY:  There were no spikes, sharp waves or paroxysmal activity.  SLEEP:  Both stage I and stage II sleep are noted.  CARDIAC:  The EKG lead revealed a regular sinus rhythm.  IMPRESSION:  This is a normal one hour sleep deprived EEG for the patients stated age.  There were no focal, hemispheric or lateralizing features.  No epileptiform activity was recorded.  A normal EEG does not exclude the diagnosis of a seizure disorder and if seizure remains high on the list of differential diagnosis, an ambulatory EEG may be of value.  Clinical correlation is required.

## 2016-03-12 NOTE — Telephone Encounter (Signed)
You can let pt know that EEG was normal

## 2016-03-13 NOTE — Telephone Encounter (Signed)
Lmovm to rtn my call. 

## 2016-03-13 NOTE — Telephone Encounter (Signed)
Patients mother/Edwina returned my call. Notified her of results.

## 2016-03-13 NOTE — Telephone Encounter (Signed)
-----   Message from Glendale Chard, DO sent at 03/12/2016  2:42 PM EDT ----- Please inform patient that his EEG was normal, no seizure activity was seen. Thanks.

## 2016-03-19 ENCOUNTER — Ambulatory Visit (HOSPITAL_COMMUNITY)
Admission: RE | Admit: 2016-03-19 | Discharge: 2016-03-19 | Disposition: A | Discharge status: Home / Self Care | Payer: BLUE CROSS/BLUE SHIELD | Source: Ambulatory Visit | Attending: Neurology | Admitting: Neurology

## 2016-03-19 DIAGNOSIS — J329 Chronic sinusitis, unspecified: Secondary | ICD-10-CM | POA: Diagnosis not present

## 2016-03-19 DIAGNOSIS — R569 Unspecified convulsions: Secondary | ICD-10-CM | POA: Insufficient documentation

## 2016-03-19 MED ORDER — GADOBENATE DIMEGLUMINE 529 MG/ML IV SOLN
15.0000 mL | Freq: Once | INTRAVENOUS | Status: AC | PRN
  Administered 2016-03-19: 15 mL via INTRAVENOUS

## 2016-03-20 ENCOUNTER — Telehealth: Payer: Self-pay | Admitting: Family Medicine

## 2016-03-20 NOTE — Telephone Encounter (Signed)
-----   Message from Glendale Chard, DO sent at 03/19/2016  3:18 PM EDT ----- Please inform patient that his MRI brain is normal. Thanks.

## 2016-03-20 NOTE — Telephone Encounter (Signed)
Spoke with patients mother/Edwina and notified her of result.

## 2016-03-26 ENCOUNTER — Ambulatory Visit (INDEPENDENT_AMBULATORY_CARE_PROVIDER_SITE_OTHER): Payer: BLUE CROSS/BLUE SHIELD

## 2016-03-26 DIAGNOSIS — B20 Human immunodeficiency virus [HIV] disease: Secondary | ICD-10-CM

## 2016-03-26 DIAGNOSIS — Z23 Encounter for immunization: Secondary | ICD-10-CM

## 2016-03-26 DIAGNOSIS — Z202 Contact with and (suspected) exposure to infections with a predominantly sexual mode of transmission: Secondary | ICD-10-CM

## 2016-03-26 DIAGNOSIS — E785 Hyperlipidemia, unspecified: Secondary | ICD-10-CM

## 2016-03-26 LAB — COMPLETE METABOLIC PANEL WITH GFR
ALBUMIN: 3.7 g/dL (ref 3.6–5.1)
ALK PHOS: 60 U/L (ref 40–115)
ALT: 10 U/L (ref 9–46)
AST: 17 U/L (ref 10–40)
BILIRUBIN TOTAL: 0.3 mg/dL (ref 0.2–1.2)
BUN: 19 mg/dL (ref 7–25)
CO2: 24 mmol/L (ref 20–31)
CREATININE: 1.2 mg/dL (ref 0.60–1.35)
Calcium: 8.5 mg/dL — ABNORMAL LOW (ref 8.6–10.3)
Chloride: 107 mmol/L (ref 98–110)
GFR, EST NON AFRICAN AMERICAN: 85 mL/min (ref >=60)
GFR, Est African American: >89 mL/min (ref >=60)
GLUCOSE: 68 mg/dL (ref 65–99)
Potassium: 3.7 mmol/L (ref 3.5–5.3)
SODIUM: 140 mmol/L (ref 135–146)
TOTAL PROTEIN: 6.8 g/dL (ref 6.1–8.1)

## 2016-03-26 LAB — LIPID PANEL
CHOL/HDL RATIO: 3.3 ratio (ref <=5.0)
CHOLESTEROL: 108 mg/dL — AB (ref 125–200)
HDL: 33 mg/dL — ABNORMAL LOW (ref >=40)
LDL Cholesterol: 43 mg/dL (ref <130)
TRIGLYCERIDES: 159 mg/dL — AB (ref <150)
VLDL: 32 mg/dL — AB (ref <30)

## 2016-03-26 LAB — HEPATITIS A ANTIBODY, TOTAL: HEP A TOTAL AB: REACTIVE — AB

## 2016-03-26 LAB — CBC WITH DIFFERENTIAL/PLATELET
BASOS ABS: 0 {cells}/uL (ref 0–200)
BASOS PCT: 0 %
EOS PCT: 5 %
Eosinophils Absolute: 250 cells/uL (ref 15–500)
HCT: 40.5 % (ref 38.5–50.0)
HEMOGLOBIN: 13.4 g/dL (ref 13.2–17.1)
LYMPHS ABS: 1450 {cells}/uL (ref 850–3900)
Lymphocytes Relative: 29 %
MCH: 28.1 pg (ref 27.0–33.0)
MCHC: 33.1 g/dL (ref 32.0–36.0)
MCV: 84.9 fL (ref 80.0–100.0)
MONOS PCT: 7 %
MPV: 9.5 fL (ref 7.5–12.5)
Monocytes Absolute: 350 cells/uL (ref 200–950)
NEUTROS ABS: 2950 {cells}/uL (ref 1500–7800)
Neutrophils Relative %: 59 %
PLATELETS: 203 10*3/uL (ref 140–400)
RBC: 4.77 MIL/uL (ref 4.20–5.80)
RDW: 13.9 % (ref 11.0–15.0)
WBC: 5 10*3/uL (ref 3.8–10.8)

## 2016-03-26 LAB — HEPATITIS B CORE ANTIBODY, TOTAL: Hep B Core Total Ab: NONREACTIVE

## 2016-03-26 LAB — HEPATITIS B SURFACE ANTIGEN: Hepatitis B Surface Ag: NEGATIVE

## 2016-03-26 LAB — HEPATITIS C ANTIBODY: HCV Ab: NEGATIVE

## 2016-03-26 LAB — HEPATITIS B SURFACE ANTIBODY,QUALITATIVE: HEP B S AB: NEGATIVE

## 2016-03-27 LAB — URINALYSIS
Bilirubin Urine: NEGATIVE
Glucose, UA: NEGATIVE
HGB URINE DIPSTICK: NEGATIVE
KETONES UR: NEGATIVE
LEUKOCYTES UA: NEGATIVE
NITRITE: NEGATIVE
PH: 6.5 (ref 5.0–8.0)
PROTEIN: NEGATIVE
Specific Gravity, Urine: 1.026 (ref 1.001–1.035)

## 2016-03-27 LAB — RPR

## 2016-03-27 LAB — QUANTIFERON TB GOLD ASSAY (BLOOD)
INTERFERON GAMMA RELEASE ASSAY: NEGATIVE
Mitogen-Nil: >10 IU/mL
QUANTIFERON NIL VALUE: 0.01 [IU]/mL
QUANTIFERON TB AG MINUS NIL: 0 [IU]/mL

## 2016-03-27 LAB — HIV-1 RNA ULTRAQUANT REFLEX TO GENTYP+
HIV 1 RNA Quant: 27337 copies/mL — ABNORMAL HIGH (ref <20)
HIV-1 RNA QUANT, LOG: 4.44 {Log_copies}/mL — AB (ref <1.30)

## 2016-03-27 LAB — T-HELPER CELL (CD4) - (RCID CLINIC ONLY)
CD4 T CELL ABS: 540 /uL (ref 400–2700)
CD4 T CELL HELPER: 38 % (ref 33–55)

## 2016-03-27 LAB — URINE CYTOLOGY ANCILLARY ONLY
CHLAMYDIA, DNA PROBE: NEGATIVE
Neisseria Gonorrhea: NEGATIVE

## 2016-04-02 LAB — HLA B*5701: HLA-B*5701 w/rflx HLA-B High: NEGATIVE

## 2016-04-06 NOTE — Progress Notes (Signed)
Patient is here today with his Mother for HIV intake visit. He tested positive in 2016 and did not seek care because he did not believe the test was correct. He cam into the exam room and immediatly put his head down on the table. When I started to ask questions his Mother begin to answer. I asked if something was wrong with Pedro Owens and she stated he was very tired after going without sleep for 3 days. When I inquired why he was without sleep she stated "work". I explained to Pedro Owens the process of the intake and I could only continue if he was able to answer questions and suggested perhaps he come another day. He stated his Mother could answer all the questions and I informed him she may be able to answer but for this intake only his answers were acceptable and if this was not possible we could reschedule the intake so no ones time was wasted.  He then lifted his head in a tilted fashion and said " I will talk when I feel like it". His Mother continued to answer questions. Toward the end of the intake.  I tried new approach to capture Pedro Owens's attention.  I explained how the HIV medications are extending the life span of those infected with HIV and how the side effects are fewer. I also explained the importance of adherence, medication resitance and the reason I am so passionate about him taking responsibility and answering the questions. Because ultimately the outcome will rest upon him.  He then became less abrasive.   Pneumonia vaccine given.

## 2016-04-07 ENCOUNTER — Encounter: Payer: Self-pay | Admitting: Internal Medicine

## 2016-04-07 DIAGNOSIS — B20 Human immunodeficiency virus [HIV] disease: Secondary | ICD-10-CM | POA: Insufficient documentation

## 2016-04-07 DIAGNOSIS — F1910 Other psychoactive substance abuse, uncomplicated: Secondary | ICD-10-CM | POA: Insufficient documentation

## 2016-04-07 DIAGNOSIS — Z21 Asymptomatic human immunodeficiency virus [HIV] infection status: Secondary | ICD-10-CM | POA: Insufficient documentation

## 2016-04-07 DIAGNOSIS — F17210 Nicotine dependence, cigarettes, uncomplicated: Secondary | ICD-10-CM | POA: Insufficient documentation

## 2016-04-08 ENCOUNTER — Ambulatory Visit: Payer: Self-pay | Admitting: *Deleted

## 2016-04-08 ENCOUNTER — Encounter: Payer: Self-pay | Admitting: Internal Medicine

## 2016-04-08 ENCOUNTER — Ambulatory Visit (INDEPENDENT_AMBULATORY_CARE_PROVIDER_SITE_OTHER): Payer: BLUE CROSS/BLUE SHIELD | Admitting: Internal Medicine

## 2016-04-08 VITALS — BP 156/95 | HR 54 | Temp 98.0°F | Ht 71.5 in | Wt 157.0 lb

## 2016-04-08 DIAGNOSIS — Z720 Tobacco use: Secondary | ICD-10-CM | POA: Diagnosis not present

## 2016-04-08 DIAGNOSIS — B20 Human immunodeficiency virus [HIV] disease: Secondary | ICD-10-CM | POA: Diagnosis not present

## 2016-04-08 DIAGNOSIS — F17210 Nicotine dependence, cigarettes, uncomplicated: Secondary | ICD-10-CM

## 2016-04-08 DIAGNOSIS — F329 Major depressive disorder, single episode, unspecified: Secondary | ICD-10-CM

## 2016-04-08 DIAGNOSIS — F32A Depression, unspecified: Secondary | ICD-10-CM

## 2016-04-08 DIAGNOSIS — F1910 Other psychoactive substance abuse, uncomplicated: Secondary | ICD-10-CM

## 2016-04-08 LAB — HIV-1 GENOTYPR PLUS

## 2016-04-08 NOTE — Assessment & Plan Note (Signed)
His cocaine and marijuana use is certainly not helping his depression and anxiety. I will continue to urge counseling and abstinence This will be critical to his ability to stay and care take antiretroviral therapy correctly and consistently and manage his HIV infection.

## 2016-04-08 NOTE — Progress Notes (Signed)
Patient ID: Pedro Owens, male   DOB: September 12, 1991, 25 y.o.   MRN: [id number]          Patient Active Problem List   Diagnosis Date Noted  . HIV disease ([place]) 04/07/2016    Priority: High  . Polysubstance abuse 04/07/2016    Priority: Medium  . Depression 08/14/2015    Priority: Medium  . ANXIETY 09/24/2010    Priority: Medium  . Cigarette smoker 04/07/2016  . Convulsions ([place]) 08/14/2015  . Conversion disorder 08/14/2015  . Hemorrhoids, internal, with bleeding 08/25/2011  . Anal warts 08/25/2011  . NAUSEA AND VOMITING 11/07/2010  . ADHD 09/24/2010  . ILEITIS 09/24/2010  . CONSTIPATION 09/24/2010  . 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 Abdominal pain, chronic, epigastric 09/24/2010    Patient's Medications  New Prescriptions   No medications on file  Previous Medications   DICYCLOMINE (BENTYL) 10 MG CAPSULE    Take 1 capsule (10 mg total) by mouth 3 (three) times daily before meals.   LORAZEPAM (ATIVAN) 1 MG TABLET    Take 1 tablet (1 mg total) by mouth every 8 (eight) hours as needed for anxiety or seizure.   OMEPRAZOLE (PRILOSEC) 40 MG CAPSULE    Take 1 capsule (40 mg total) by mouth daily.   OXYCODONE (OXY IR/ROXICODONE) 5 MG IMMEDIATE RELEASE TABLET    TK 1 T PO QID   OXYCODONE-ACETAMINOPHEN (PERCOCET/ROXICET) 5-325 MG TABLET    Take 1 tablet by mouth every 6 (six) hours as needed for severe pain.   PAROXETINE (PAXIL) 20 MG TABLET    Take 1 tablet (20 mg total) by mouth daily.   PROMETHAZINE (PHENERGAN) 25 MG TABLET    Take 1 tablet (25 mg total) by mouth every 8 (eight) hours as needed for nausea or vomiting.   SUCRALFATE (CARAFATE) 1 G TABLET    Take 1 tablet (1 g total) by mouth 4 (four) times daily.   ZOLPIDEM (AMBIEN) 10 MG TABLET    TK 1 T PO QHS  Modified Medications   No medications on file  Discontinued Medications   No medications on file    Subjective: [name] is in for his initial HIV visit with me. He was diagnosed last October during a routine visit  with his gastroenterologist. He did not enter into care for his HIV infection because he tells me that he did not believe it was real. He initially denied having any risk factors but now tells me that he has about 20 lifetime sexual contacts all male and mostly anonymous.He has no other known risk factors. He does not believe he has ever been tested previously. Since learning of the test result he shared that information with his mother who is with him today. He has been depressed and anxious.He has been using cocaine about every other day. He also uses marijuana. He is an active cigarette smoker.  He has chronic abdominal pain and is currently on oxycodone prescribed by his primary care provider. He is also taking Ambien on regular basis. He has been referred to a pain clinic. 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  He completed high school. 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 He is currently living [place] with a girl friend and her children. His mother tells me that he has been struggling financially and has difficulty  with transportation. During his nurse intake visit recently he did reveal to our head nurse that he is transgender male to male. He would not discuss much of anything with me today.  Have you ever been diagnosed with any opportunistic infections? None  Review of Systems: Review of Systems  Constitutional: Positive for malaise/fatigue. Negative for fever, chills, weight loss and diaphoresis.  HENT: Negative for sore throat.   Eyes: Negative for blurred vision.  Respiratory: Negative for cough, sputum production and shortness of breath.   Cardiovascular: Negative for chest pain.  Gastrointestinal: Positive for abdominal pain and constipation. Negative for heartburn, nausea, vomiting and diarrhea.    Genitourinary: Negative for dysuria.  Musculoskeletal: Negative for myalgias and joint pain.  Skin: Negative for rash.  Neurological: Positive for seizures. Negative for dizziness and headaches.  Psychiatric/Behavioral: Positive for depression and substance abuse. The patient is nervous/anxious.     Past Medical History  Diagnosis Date  . Ileitis   . ADHD (attention deficit hyperactivity disorder)   . Lymphoid hyperplasia, reactive   . Anxiety   . Marijuana abuse   . Gonorrhea 09/08/11  . Anal warts   . Pseudoseizures   . Chronic pain syndrome   . Crohn's disease ([place])   . Depression   . Seizures ([place])   . HIV infection [facility])     Social History  Substance Use Topics  . Smoking status: Current Every Day Smoker -- 1.00 packs/day for 8 years    Types: Cigarettes  . Smokeless tobacco: Never Used  . Alcohol Use: 0.6 oz/week    1 Shots of liquor per week     Comment: Occ    Family History  Problem Relation Age of Onset  . Colon cancer Neg Hx   . Diabetes Maternal Uncle   . Diabetes Mother   . Irritable bowel syndrome Mother   . Hypertension Mother   . Hyperlipidemia Mother   . Hypothyroidism Mother   . Heart disease Maternal Grandmother   . Leukemia Other     maternal greatgrandmother    Allergies  Allergen Reactions  . Acetaminophen Diarrhea    Flares up crohns disease  . Vicodin [Hydrocodone-Acetaminophen] Nausea And Vomiting  . Dilaudid [Hydromorphone Hcl] Hives and Rash    Objective:  Filed Vitals:   04/08/16 0929  BP: 156/95  Pulse: 54  Temp: 98 F (36.7 C)  TempSrc: Oral  Height: 5' 11.5" (1.816 m)  Weight: 157 lb (71.215 kg)   Body mass index is 21.59 kg/(m^2).  Physical Exam  Constitutional: He is oriented to person, place, and time. He appears distressed.  Tearful with poor eye contact throughout the exam. He barely spoke throughout the 45 minute exam. His mother tried to answer questions for me.  HENT:  Mouth/Throat: No oropharyngeal  exudate.  Eyes: Conjunctivae are normal.  Cardiovascular: Normal rate and regular rhythm.   No murmur heard. Pulmonary/Chest: Breath sounds normal.  Abdominal: Soft. There is no tenderness.  Musculoskeletal: Normal range of motion.  Neurological: He is alert and oriented to person, place, and time. Gait normal.  Skin: No rash noted.    Lab Results  HIV 1 RNA QUANT (copies/mL)  Date Value  03/26/2016 27337*   CD4 T CELL ABS (/uL)  Date Value  03/26/2016 540   No results found for: HIV1GENOSEQ   Lab Results  Component Value Date   WBC 5.0 03/26/2016   HGB 13.4 03/26/2016   HCT 40.5 03/26/2016   MCV 84.9 03/26/2016   PLT  203 03/26/2016    Lab Results  Component Value Date   CREATININE 1.20 03/26/2016   BUN 19 03/26/2016   NA 140 03/26/2016   K 3.7 03/26/2016   CL 107 03/26/2016   CO2 24 03/26/2016   Lab Results  Component Value Date   ALT 10 03/26/2016   AST 17 03/26/2016   ALKPHOS 60 03/26/2016   BILITOT 0.3 03/26/2016    Lab Results  Component Value Date   CHOL 108* 03/26/2016   TRIG 159* 03/26/2016   HDL 33* 03/26/2016   LDLCALC 43 03/26/2016   Lab Results  Component Value Date   HAV REACTIVE* 03/26/2016   Lab Results  Component Value Date   HEPBSAG NEGATIVE 03/26/2016   HEPBSAB NEG 03/26/2016   Lab Results  Component Value Date   HCVAB NEGATIVE 03/26/2016   Lab Results  Component Value Date   CHLAMYDIAWP Negative 03/26/2016   N Negative 03/26/2016   No results found for: GCPROBEAPT   Lab Results  Component Value Date   QUANTGOLD NEGATIVE 03/26/2016     Problem List Items Addressed This Visit      High   HIV disease ([place]) - Primary    I reviewed all of his recent lab results with him, specifically his elevated viral load and told him that this confirms that he does have HIV infection. Fortunately he still has a normal CD4 count. I believe it'll take some time for him to develop trust in our care team here. However if he willI feel  quite certain that we can help him get on to antiretroviral therapy and get his infection under control.His genotype resistance assay results are pending.He will followup with me in one week        Medium   Depression    He is pretty much paralyzed by depression and anxiety now. I did have him meet with Rolena Infante, our behavioral health counselor, today. She met with him alone without his mother present. During that part of the visit he did break down and sobbed for quite some time. Following that he seemed more relaxed and open and even smiled.I believe that this led to somewhat of a breakthrough. He will followup with Leveda Anna in one week.      Polysubstance abuse    His cocaine and marijuana use is certainly not helping his depression and anxiety. I will continue to urge counseling and abstinence This will be critical to his ability to stay and care take antiretroviral therapy correctly and consistently and manage his HIV infection.        Unprioritized   Cigarette smoker        Michel Bickers, MD [facility] for Infectious [place] [phone] pager   [phone] cell 04/08/2016, 1:25 PM

## 2016-04-08 NOTE — Assessment & Plan Note (Signed)
I reviewed all of his recent lab results with him, specifically his elevated viral load and told him that this confirms that he does have HIV infection. Fortunately he still has a normal CD4 count. I believe it'll take some time for him to develop trust in our care team here. However if he willI feel quite certain that we can help him get on to antiretroviral therapy and get his infection under control.His genotype resistance assay results are pending.He will followup with me in one week

## 2016-04-08 NOTE — Assessment & Plan Note (Signed)
He is pretty much paralyzed by depression and anxiety now. I did have him meet with Rolena Infante, our behavioral health counselor, today. She met with him alone without his mother present. During that part of the visit he did break down and sobbed for quite some time. Following that he seemed more relaxed and open and even smiled.I believe that this led to somewhat of a breakthrough. He will followup with Leveda Anna in one week.

## 2016-04-08 NOTE — BH Specialist Note (Signed)
Counselor met with Pedro Owens today in the exam room for a warm handoff.  Patient was oriented times four with sad/flat affect.  Patient was alert but tearful, giving no eye contact, and hiding his face in his hands and jacket. Counselor inquired with patient as to how things were going.  Patient would not respond.  Patient's mother was accompanying patient and began to speak for him.  Counselor requested that mom step into lobby and patient come to counselor office for more privacy and more inviting meeting place.  Patient began to slowly engage with counselor asking questions and sharing his deepest feelings about getting HIV.  Counselor used reflective listening skills and educated patient about HIV as well as developing better coping skills. Counselor offered patient much reassurance and support encouraging him to share freely. Patient began to mention a few things that were bothering him.  Counselor used humor to help break through patient's "defensive wall" which gradually broke.  At this point patient cried like a baby and sobbed.  Counselor provided support accordingly.  Patient eventually warmed up and actually smiled a couple of times, giving eye contact, and pulling his face out and away from his jacket.  Patient admitted that he had fear and anxiety and that the whole situation was overwhelming. Counselor encouraged patient to move toward the future with interest not so much distrust and suspicion. Patient stated he would like another appointment with counselor.  One was made for a week out.   Rolena Infante, MA,LPC [facility]

## 2016-04-16 ENCOUNTER — Encounter: Payer: Self-pay | Admitting: Internal Medicine

## 2016-04-16 ENCOUNTER — Ambulatory Visit: Payer: BLUE CROSS/BLUE SHIELD | Admitting: *Deleted

## 2016-04-16 ENCOUNTER — Ambulatory Visit (INDEPENDENT_AMBULATORY_CARE_PROVIDER_SITE_OTHER): Payer: BLUE CROSS/BLUE SHIELD | Admitting: Internal Medicine

## 2016-04-16 VITALS — BP 146/94 | HR 67 | Temp 98.3°F | Ht 71.5 in | Wt 154.0 lb

## 2016-04-16 DIAGNOSIS — Z23 Encounter for immunization: Secondary | ICD-10-CM | POA: Diagnosis not present

## 2016-04-16 DIAGNOSIS — F32A Depression, unspecified: Secondary | ICD-10-CM

## 2016-04-16 DIAGNOSIS — B20 Human immunodeficiency virus [HIV] disease: Secondary | ICD-10-CM | POA: Diagnosis not present

## 2016-04-16 DIAGNOSIS — F329 Major depressive disorder, single episode, unspecified: Secondary | ICD-10-CM | POA: Diagnosis not present

## 2016-04-16 DIAGNOSIS — F1910 Other psychoactive substance abuse, uncomplicated: Secondary | ICD-10-CM

## 2016-04-16 MED ORDER — ABACAVIR-DOLUTEGRAVIR-LAMIVUD 600-50-300 MG PO TABS: 1.0000 | ORAL_TABLET | Freq: Every day | ORAL | Status: DC

## 2016-04-16 NOTE — Assessment & Plan Note (Signed)
He has been self-medicating with cocaine but has some insight about how harmful this may be for him.I encouraged him to continue to work on quitting cocaine use as soon as possible.

## 2016-04-16 NOTE — Assessment & Plan Note (Signed)
He has fairly good recall of our discussion about the interpretation of his CD4 count and viral load. I reiterated to him that his infection was caught early. He has no resistance mutations. He is motivated to start therapy. I talked to him about treatment options. He does not eat meals on any regular schedule so I will start him on Triumeq.He will take each morning around 11 AM when he wakes up. He met with  Onnie Boer, our ID pharmacist to review adherence and give him a co-pay card.

## 2016-04-16 NOTE — BH Specialist Note (Signed)
Pedro Owens was present for his scheduled appointment with counselor.  Patient was oriented times four with flat affect but talkative.  Patient was soft spoken and did not share a lot outside answering questions that counselor asked.  Patient did indicate that he was a 7 out of 10 with 1 feeling terrible and 10 feeling great. Patient stated that some of the stinging from finding out he was HIV positive has warn off and he was adjusting and accepting it better.  Patient did communicate that his life was boring and all he did was work.  Counselor discussed with patient finding recreational pursuits in order to distract, have fun, and exercise.  Patient said that he does not have enough time to do those types of things because sometimes he just isn't motivated to them. Counselor explained that that lack of motivation is some depression creeping in and causing him to isolate.  Counselor educated patient about detaching and isolation and how it can negatively affect [place].  Counselor provided support and encouragement accordingly for patient. Counselor recommended that he make another appointment with counselor for two weeks out.   Jenel Lucks, MA, LPC [facility]

## 2016-04-16 NOTE — Progress Notes (Signed)
[facility]  Patient Active Problem List   Diagnosis Date Noted  . HIV disease ([place]) 04/07/2016    Priority: High  . Polysubstance abuse 04/07/2016    Priority: Medium  . Depression 08/14/2015    Priority: Medium  . ANXIETY 09/24/2010    Priority: Medium  . Cigarette smoker 04/07/2016  . Convulsions ([place]) 08/14/2015  . Conversion disorder 08/14/2015  . Hemorrhoids, internal, with bleeding 08/25/2011  . Anal warts 08/25/2011  . NAUSEA AND VOMITING 11/07/2010  . ADHD 09/24/2010  . ILEITIS 09/24/2010  . CONSTIPATION 09/24/2010  . 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 Abdominal pain, chronic, epigastric 09/24/2010    Patient's Medications  New Prescriptions   ABACAVIR-DOLUTEGRAVIR-LAMIVUDINE (TRIUMEQ) 600-50-300 MG TABLET    Take 1 tablet by mouth daily.  Previous Medications   OXYCODONE-ACETAMINOPHEN (PERCOCET/ROXICET) 5-325 MG TABLET    Take 1 tablet by mouth every 4 (four) hours as needed for severe pain.   ZOLPIDEM (AMBIEN) 10 MG TABLET    TK 1 T PO QHS  Modified Medications   No medications on file  Discontinued Medications   DICYCLOMINE (BENTYL) 10 MG CAPSULE    Take 1 capsule (10 mg total) by mouth 3 (three) times daily before meals.   LORAZEPAM (ATIVAN) 1 MG TABLET    Take 1 tablet (1 mg total) by mouth every 8 (eight) hours as needed for anxiety or seizure.   OMEPRAZOLE (PRILOSEC) 40 MG CAPSULE    Take 1 capsule (40 mg total) by mouth daily.   OXYCODONE (OXY IR/ROXICODONE) 5 MG IMMEDIATE RELEASE TABLET    TK 1 T PO QID   OXYCODONE-ACETAMINOPHEN (PERCOCET/ROXICET) 5-325 MG TABLET    Take 1 tablet by mouth every 6 (six) hours as needed for severe pain.   PAROXETINE (PAXIL) 20 MG TABLET    Take 1 tablet (20 mg total) by mouth daily.   PROMETHAZINE (PHENERGAN) 25 MG TABLET    Take 1 tablet (25 mg total) by mouth every 8 (eight) hours as needed for nausea or vomiting.   SUCRALFATE (CARAFATE) 1 G TABLET    Take 1 tablet (1 g total) by  mouth 4 (four) times daily.    Subjective: Pedro Owens is in for his one-week followup visit to establish ongoing care for his HIV infection. He states that he is feeling a little bit better now than he was last week. He tells me that he feels better because he realizes that his infection and can be controlled. He is still feeling depressed but not as much so as last week. He states that he has only used cocaine once in the past week and is trying hard to quit completely. He has not smoked any marijuana. He states that he does not drink alcohol. He does continue to smoke cigarettes and does not feel like he can quit currently. He tells me that he identifies as gay, not transgender. He is not in a current relationship and has not been sexually active  Review of Systems: Review of Systems  Constitutional: Positive for malaise/fatigue. Negative for fever, chills, weight loss and diaphoresis.  HENT: Negative for sore throat.   Respiratory: Negative for cough, sputum production and shortness of breath.   Cardiovascular: Negative for chest pain.  Gastrointestinal: Negative for heartburn, nausea, vomiting, abdominal pain and diarrhea.  Genitourinary: Negative for dysuria.  Musculoskeletal: Negative for myalgias and joint pain.  Skin: Negative for rash.  Neurological: Negative for dizziness and headaches.  Psychiatric/Behavioral:  Positive for depression and substance abuse. The patient is not nervous/anxious.     Past Medical History  Diagnosis Date  . Ileitis   . ADHD (attention deficit hyperactivity disorder)   . Lymphoid hyperplasia, reactive   . Anxiety   . Marijuana abuse   . Gonorrhea 09/08/11  . Anal warts   . Pseudoseizures   . Chronic pain syndrome   . Crohn's disease ([place])   . Depression   . Seizures ([place])   . HIV infection [facility])     Social History  Substance Use Topics  . Smoking status: Current Every Day Smoker -- 1.00 packs/day for 8 years    Types: Cigarettes  . Smokeless  tobacco: Never Used  . Alcohol Use: 0.6 oz/week    1 Shots of liquor per week     Comment: Occ    Family History  Problem Relation Age of Onset  . Colon cancer Neg Hx   . Diabetes Maternal Uncle   . Diabetes Mother   . Irritable bowel syndrome Mother   . Hypertension Mother   . Hyperlipidemia Mother   . Hypothyroidism Mother   . Heart disease Maternal Grandmother   . Leukemia Other     maternal greatgrandmother    Allergies  Allergen Reactions  . Acetaminophen Diarrhea    Flares up crohns disease  . Vicodin [Hydrocodone-Acetaminophen] Nausea And Vomiting  . Dilaudid [Hydromorphone Hcl] Hives and Rash    Objective: Filed Vitals:   04/16/16 1126  BP: 146/94  Pulse: 67  Temp: 98.3 F (36.8 C)  TempSrc: Oral  Height: 5' 11.5" (1.816 m)  Weight: 154 lb (69.854 kg)   Body mass index is 21.18 kg/(m^2).  Physical Exam  Constitutional: He is oriented to person, place, and time.  His affect is still somewhat flat but he has much better eye contact than last week and is more talkative.His mother came with him today but stayed in the waiting room.  HENT:  Mouth/Throat: No oropharyngeal exudate.  Eyes: Conjunctivae are normal.  Cardiovascular: Normal rate and regular rhythm.   No murmur heard. Pulmonary/Chest: Breath sounds normal.  Abdominal: Soft. He exhibits no mass. There is no tenderness.  Musculoskeletal: Normal range of motion.  Neurological: He is alert and oriented to person, place, and time. Gait normal.  Skin: No rash noted.    Lab Results    Problem List Items Addressed This Visit      High   HIV disease [facility])    He has fairly good recall of our discussion about the interpretation of his CD4 count and viral load. I reiterated to him that his infection was caught early. He has no resistance mutations. He is motivated to start therapy. I talked to him about treatment options. He does not eat meals on any regular schedule so I will start him on Triumeq.He  will take each morning around 11 AM when he wakes up. He met with  Onnie Boer, our ID pharmacist to review adherence and give him a co-pay card.      Relevant Medications   abacavir-dolutegravir-lamiVUDine (TRIUMEQ) 600-50-300 MG tablet   Other Relevant Orders   T-helper cell (CD4)- ([facility] clinic only)   HIV 1 RNA quant-no reflex-bld   CBC   Comprehensive metabolic panel     Medium   Depression    He is not as depressed and withdrawn today as he was last week. I think ongoing education about how he can manage his infection will help. i did  have him meet again today with Rolena Infante, our behavioral health counselor, and he will schedule followup visits with her.      Polysubstance abuse    He has been self-medicating with cocaine but has some insight about how harmful this may be for him.I encouraged him to continue to work on quitting cocaine use as soon as possible.       Other Visit Diagnoses    Need for prophylactic vaccination and inoculation against viral hepatitis    -  Primary    Relevant Orders    Hepatitis B vaccine adult IM (Completed)        Michel Bickers, MD [facility] for [place] [phone] pager   [phone] cell 04/16/2016, 3:19 PM

## 2016-04-16 NOTE — Assessment & Plan Note (Signed)
He is not as depressed and withdrawn today as he was last week. I think ongoing education about how he can manage his infection will help. i did have him meet again today with Jenel Lucks, our behavioral health counselor, and he will schedule followup visits with her.

## 2016-05-06 ENCOUNTER — Ambulatory Visit (INDEPENDENT_AMBULATORY_CARE_PROVIDER_SITE_OTHER): Payer: BLUE CROSS/BLUE SHIELD | Admitting: Gastroenterology

## 2016-05-06 ENCOUNTER — Encounter: Payer: Self-pay | Admitting: Gastroenterology

## 2016-05-06 VITALS — BP 110/64 | HR 51 | Ht 71.0 in | Wt 159.0 lb

## 2016-05-06 DIAGNOSIS — G8929 Other chronic pain: Secondary | ICD-10-CM

## 2016-05-06 DIAGNOSIS — R109 Unspecified abdominal pain: Secondary | ICD-10-CM | POA: Diagnosis not present

## 2016-05-06 DIAGNOSIS — B20 Human immunodeficiency virus [HIV] disease: Secondary | ICD-10-CM | POA: Diagnosis not present

## 2016-05-06 DIAGNOSIS — K529 Noninfective gastroenteritis and colitis, unspecified: Secondary | ICD-10-CM

## 2016-05-06 MED ORDER — DESIPRAMINE HCL 25 MG PO TABS: 25.0000 mg | ORAL_TABLET | Freq: Every day | ORAL | Status: DC

## 2016-05-06 MED ORDER — NA SULFATE-K SULFATE-MG SULF 17.5-3.13-1.6 GM/177ML PO SOLN: ORAL | Status: DC

## 2016-05-06 NOTE — Progress Notes (Signed)
HPI :  25 y/o male here for a follow up. Previously seen by Dr. Olevia Perches and PA Zehr, new to me. He has a history of recently diagnosed HIV, transgender male.   He reports he has a history of "Crohns" causing ongoing chronic abdominal discomfort. This is based on a prior colonoscopy in 2011 showing nodularity in the ileum with biopsies showing "focal active ileitis". He endorses some pain in his lower abdomen diffusely, which is present all the time, feels it deep, and sometimes distended. She reports symptoms since age 89 and has been stable since childhood.  He always has pain in the lower abdomen, and sometimes epigastric discomfort, the latter of which is new. Usually eating does not make a difference. Occasional nausea / vomiting, but rare, not significant. He denies any relation of bowel habits to pain. He reports 1-2 BMS per day. No blood in the stools. No constipation or diarrhea. He thinks gaining weight within the past month or so. No nightsweats or fevers.  He reports sitting up makes pain better, but standing can cause pain or lying down can make it worse. He has been tried on PPI in the past which has not helped. He denies any reflux. He denies any alcohol. (+) tobacco 1 PPD. He has used cocaine, occasionally. No marijuana.  Pain is present all the time, never goes away for the most part. Pain rated 7/10 all the time. No other alleviating or aggrevating factors.   He thought he had had Crohns in the past and has been on steroids remotely, he is not sure if it helped or not historically.   Not taking anything for pain right now. He was referred to pain management but did not go. He denies any significant NSAID use.   He has a history of HIV, diagnosed within the past year, recent diagnosis. He has been on therapy for the past month or so. He doesn't feel any different in regards to his pain since starting. He has a history of depression, currently not treated for it, he states it's mild and  denies suidical ideation.  Prior workup: Colonoscopy 07/2010 - prominent nodularity of ileum, otherwise normal colon - biopsies show "focal active ileitis" CT enterography - 07/2015 - normal    Past Medical History  Diagnosis Date  . Ileitis   . ADHD (attention deficit hyperactivity disorder)   . Lymphoid hyperplasia, reactive   . Anxiety   . Marijuana abuse   . Gonorrhea 09/08/11  . Anal warts   . Pseudoseizures   . Chronic pain syndrome   . Depression   . Seizures ([place])   . HIV infection [facility])      Past Surgical History  Procedure Laterality Date  . Foot surgery Bilateral 2005    Screw & Pins  . Ankle surgery Bilateral 2005    Screw & Pins   Family History  Problem Relation Age of Onset  . Colon cancer Neg Hx   . Diabetes Maternal Uncle   . Diabetes Mother   . Irritable bowel syndrome Mother   . Hypertension Mother   . Hyperlipidemia Mother   . Hypothyroidism Mother   . Heart disease Maternal Grandmother   . Leukemia Other     maternal greatgrandmother   Social History  Substance Use Topics  . Smoking status: Current Every Day Smoker -- 1.00 packs/day for 8 years    Types: Cigarettes  . Smokeless tobacco: Never Used  . Alcohol Use: 0.6 oz/week  1 Shots of liquor per week     Comment: Occ   Current Outpatient Prescriptions  Medication Sig Dispense Refill  . abacavir-dolutegravir-lamiVUDine (TRIUMEQ) 600-50-300 MG tablet Take 1 tablet by mouth daily. 30 tablet 11  . desipramine (NORPRAMIN) 25 MG tablet Take 1 tablet (25 mg total) by mouth at bedtime. 90 tablet 3  . Na Sulfate-K Sulfate-Mg Sulf (SUPREP BOWEL PREP KIT) 17.5-3.13-1.6 GM/180ML SOLN Use as directed 2 Bottle 0  . [DISCONTINUED] divalproex (DEPAKOTE) 500 MG DR tablet Take 1 tablet (500 mg total) by mouth 2 (two) times daily. 60 tablet 2   No current facility-administered medications for this visit.   Allergies  Allergen Reactions  . Acetaminophen Diarrhea    Flares up crohns disease  .  Vicodin [Hydrocodone-Acetaminophen] Nausea And Vomiting  . Dilaudid [Hydromorphone Hcl] Hives and Rash     Review of Systems: All systems reviewed and negative except where noted in HPI.   Lab Results  Component Value Date   WBC 5.0 03/26/2016   HGB 13.4 03/26/2016   HCT 40.5 03/26/2016   MCV 84.9 03/26/2016   PLT 203 03/26/2016    Lab Results  Component Value Date   ALT 10 03/26/2016   AST 17 03/26/2016   ALKPHOS 60 03/26/2016   BILITOT 0.3 03/26/2016    Lab Results  Component Value Date   CREATININE 1.20 03/26/2016   BUN 19 03/26/2016   NA 140 03/26/2016   K 3.7 03/26/2016   CL 107 03/26/2016   CO2 24 03/26/2016    Lab Results  Component Value Date   CRP 1.5* 08/01/2010    Lab Results  Component Value Date   ESRSEDRATE 5 08/09/2015     Physical Exam: BP 110/64 mmHg  Pulse 51  Ht '5\' 11"'$  (1.803 m)  Wt 159 lb (72.122 kg)  BMI 22.19 kg/m2 Constitutional: Pleasant,well-developed, male in no acute distress. HEENT: Normocephalic and atraumatic. Conjunctivae are normal. No scleral icterus. Neck supple.  Cardiovascular: Normal rate, regular rhythm.  Pulmonary/chest: Effort normal and breath sounds normal. No wheezing, rales or rhonchi. Abdominal: Soft, nondistended, mild epigastric and lower abdominal TTP. Negative [place].. Bowel sounds active throughout. There are no masses palpable. No hepatomegaly. Extremities: no edema Lymphadenopathy: No cervical adenopathy noted. Neurological: Alert and oriented to person place and time. Skin: Skin is warm and dry. No rashes noted. Psychiatric: Normal mood and affect. Behavior is normal.   ASSESSMENT AND PLAN: 25 y/o male with recently diagnosed HIV, who has a history of ileitis on prior colonoscopy, presenting with ongoing symptoms of chronic abdominal pain. Recent CT enterography showed no evidence of active inflammation. Inflammatory markers are normal. He has chronic pain, present 24/7, bothering him since age 5.    My impression at this time is that it is unlikely that he has Crohns disease. He had nonspecific ileitis in 2011 which could have been due to infection, we don't know how long he has had HIV and if that played a role at the time as well. He has chronic abdominal pain syndrome which may otherwise be neuropathic or abdominal wall pain. I recommend he avoid narcotics for this issue on a chronic basis, especially with his history of substance abuse. I offered him a trial of TCA, specifically desipramine, to treat chronic pain syndrome and see if this helps. I discussed risks / benefits of it and he wished to proceed. We will start it at '25mg'$  qHS and ramp up over time if he tolerates it.   Otherwise, he  remains concerned about the possibility of IBD. I think this is unlikely, especially with normal labs and CT enterography recently, but offered him a colonoscopy with further biopsies of the ileum, and EGD to ensure no evidence of upper tract disease. I discussed risks / benefits of endoscopy and anesthesia, and he wished to proceed. Further recommendations pending this result.   Fitzhugh Cellar, MD [facility] Pager [phone]  CC: Vonna Drafts, FNP

## 2016-05-06 NOTE — Patient Instructions (Signed)
You have been scheduled for an endoscopy and colonoscopy. Please follow the written instructions given to you at your visit today. Please pick up your prep supplies at the pharmacy within the next 1-3 days. If you use inhalers (even only as needed), please bring them with you on the day of your procedure. Your physician has requested that you go to www.startemmi.com and enter the access code given to you at your visit today. This web site gives a general overview about your procedure. However, you should still follow specific instructions given to you by our office regarding your preparation for the procedure.   We have sent the following medications to your pharmacy for you to pick up at your convenience: Desipramine    I appreciate the opportunity to care for you.

## 2016-05-11 ENCOUNTER — Ambulatory Visit: Payer: BLUE CROSS/BLUE SHIELD | Admitting: *Deleted

## 2016-05-13 ENCOUNTER — Ambulatory Visit: Payer: Self-pay | Admitting: Neurology

## 2016-05-13 DIAGNOSIS — Z029 Encounter for administrative examinations, unspecified: Secondary | ICD-10-CM

## 2016-05-18 ENCOUNTER — Encounter: Payer: Self-pay | Admitting: Neurology

## 2016-06-01 ENCOUNTER — Encounter: Payer: Self-pay | Admitting: Internal Medicine

## 2016-06-02 ENCOUNTER — Other Ambulatory Visit: Payer: BLUE CROSS/BLUE SHIELD

## 2016-06-02 DIAGNOSIS — B20 Human immunodeficiency virus [HIV] disease: Secondary | ICD-10-CM

## 2016-06-02 LAB — CBC
HCT: 39.4 % (ref 38.5–50.0)
HEMOGLOBIN: 13.7 g/dL (ref 13.2–17.1)
MCH: 28.7 pg (ref 27.0–33.0)
MCHC: 34.8 g/dL (ref 32.0–36.0)
MCV: 82.6 fL (ref 80.0–100.0)
MPV: 8.5 fL (ref 7.5–12.5)
Platelets: 301 10*3/uL (ref 140–400)
RBC: 4.77 MIL/uL (ref 4.20–5.80)
RDW: 14.8 % (ref 11.0–15.0)
WBC: 6.6 10*3/uL (ref 3.8–10.8)

## 2016-06-02 LAB — COMPREHENSIVE METABOLIC PANEL
ALBUMIN: 3.5 g/dL — AB (ref 3.6–5.1)
ALT: 11 U/L (ref 9–46)
AST: 14 U/L (ref 10–40)
Alkaline Phosphatase: 64 U/L (ref 40–115)
BUN: 12 mg/dL (ref 7–25)
CHLORIDE: 108 mmol/L (ref 98–110)
CO2: 26 mmol/L (ref 20–31)
Calcium: 8.9 mg/dL (ref 8.6–10.3)
Creat: 0.98 mg/dL (ref 0.60–1.35)
Glucose, Bld: 75 mg/dL (ref 65–99)
POTASSIUM: 3.9 mmol/L (ref 3.5–5.3)
Sodium: 142 mmol/L (ref 135–146)
Total Bilirubin: 0.2 mg/dL (ref 0.2–1.2)
Total Protein: 7 g/dL (ref 6.1–8.1)

## 2016-06-03 LAB — HIV-1 RNA QUANT-NO REFLEX-BLD: HIV 1 RNA Quant: <20 copies/mL (ref <20)

## 2016-06-03 LAB — T-HELPER CELL (CD4) - (RCID CLINIC ONLY)
CD4 T CELL HELPER: 36 % (ref 33–55)
CD4 T Cell Abs: 790 /uL (ref 400–2700)

## 2016-06-15 ENCOUNTER — Encounter: Payer: BLUE CROSS/BLUE SHIELD | Admitting: Gastroenterology

## 2016-06-15 ENCOUNTER — Telehealth: Payer: Self-pay | Admitting: Gastroenterology

## 2016-06-15 NOTE — Telephone Encounter (Signed)
No cancellation fee if he is otherwise ill at this time. He can reschedule at his convenience. Thanks

## 2016-06-15 NOTE — Telephone Encounter (Signed)
Spoke to patient's mother.  Patient is running a fever, throwing up since yesterday.  Patient does not feel well.  Advised mother to cancel procedure for today and call to reschedule when patient feels better.  [facility] Notified.

## 2016-06-16 ENCOUNTER — Ambulatory Visit: Payer: BLUE CROSS/BLUE SHIELD | Admitting: Internal Medicine

## 2016-06-25 ENCOUNTER — Inpatient Hospital Stay (HOSPITAL_COMMUNITY)
Admission: EM | Admit: 2016-06-25 | Discharge: 2016-07-03 | DRG: 579 | Disposition: A | Discharge status: Home Health | Payer: BLUE CROSS/BLUE SHIELD | Attending: Internal Medicine | Admitting: Internal Medicine

## 2016-06-25 ENCOUNTER — Encounter (HOSPITAL_COMMUNITY): Payer: Self-pay | Admitting: Emergency Medicine

## 2016-06-25 DIAGNOSIS — D62 Acute posthemorrhagic anemia: Secondary | ICD-10-CM | POA: Diagnosis not present

## 2016-06-25 DIAGNOSIS — F1910 Other psychoactive substance abuse, uncomplicated: Secondary | ICD-10-CM | POA: Diagnosis present

## 2016-06-25 DIAGNOSIS — M79631 Pain in right forearm: Secondary | ICD-10-CM | POA: Diagnosis present

## 2016-06-25 DIAGNOSIS — F17210 Nicotine dependence, cigarettes, uncomplicated: Secondary | ICD-10-CM | POA: Diagnosis present

## 2016-06-25 DIAGNOSIS — N179 Acute kidney failure, unspecified: Secondary | ICD-10-CM | POA: Diagnosis not present

## 2016-06-25 DIAGNOSIS — G894 Chronic pain syndrome: Secondary | ICD-10-CM | POA: Diagnosis present

## 2016-06-25 DIAGNOSIS — K5900 Constipation, unspecified: Secondary | ICD-10-CM | POA: Diagnosis present

## 2016-06-25 DIAGNOSIS — B954 Other streptococcus as the cause of diseases classified elsewhere: Secondary | ICD-10-CM | POA: Diagnosis present

## 2016-06-25 DIAGNOSIS — Z79899 Other long term (current) drug therapy: Secondary | ICD-10-CM | POA: Diagnosis not present

## 2016-06-25 DIAGNOSIS — L02413 Cutaneous abscess of right upper limb: Secondary | ICD-10-CM | POA: Diagnosis present

## 2016-06-25 DIAGNOSIS — F1410 Cocaine abuse, uncomplicated: Secondary | ICD-10-CM | POA: Diagnosis present

## 2016-06-25 DIAGNOSIS — B20 Human immunodeficiency virus [HIV] disease: Secondary | ICD-10-CM | POA: Diagnosis present

## 2016-06-25 DIAGNOSIS — R194 Change in bowel habit: Secondary | ICD-10-CM | POA: Diagnosis present

## 2016-06-25 DIAGNOSIS — L03113 Cellulitis of right upper limb: Secondary | ICD-10-CM | POA: Diagnosis not present

## 2016-06-25 DIAGNOSIS — T39395A Adverse effect of other nonsteroidal anti-inflammatory drugs [NSAID], initial encounter: Secondary | ICD-10-CM | POA: Diagnosis not present

## 2016-06-25 DIAGNOSIS — Z720 Tobacco use: Secondary | ICD-10-CM | POA: Diagnosis not present

## 2016-06-25 DIAGNOSIS — T40605A Adverse effect of unspecified narcotics, initial encounter: Secondary | ICD-10-CM | POA: Diagnosis not present

## 2016-06-25 DIAGNOSIS — M5489 Other dorsalgia: Secondary | ICD-10-CM | POA: Diagnosis not present

## 2016-06-25 DIAGNOSIS — K5903 Drug induced constipation: Secondary | ICD-10-CM | POA: Diagnosis not present

## 2016-06-25 DIAGNOSIS — T368X5A Adverse effect of other systemic antibiotics, initial encounter: Secondary | ICD-10-CM | POA: Diagnosis not present

## 2016-06-25 DIAGNOSIS — A491 Streptococcal infection, unspecified site: Secondary | ICD-10-CM | POA: Diagnosis present

## 2016-06-25 DIAGNOSIS — A4902 Methicillin resistant Staphylococcus aureus infection, unspecified site: Secondary | ICD-10-CM | POA: Diagnosis present

## 2016-06-25 DIAGNOSIS — M549 Dorsalgia, unspecified: Secondary | ICD-10-CM | POA: Diagnosis not present

## 2016-06-25 DIAGNOSIS — K5901 Slow transit constipation: Secondary | ICD-10-CM | POA: Diagnosis not present

## 2016-06-25 DIAGNOSIS — A498 Other bacterial infections of unspecified site: Secondary | ICD-10-CM | POA: Diagnosis present

## 2016-06-25 DIAGNOSIS — Z21 Asymptomatic human immunodeficiency virus [HIV] infection status: Secondary | ICD-10-CM | POA: Diagnosis present

## 2016-06-25 DIAGNOSIS — L0390 Cellulitis, unspecified: Secondary | ICD-10-CM | POA: Insufficient documentation

## 2016-06-25 HISTORY — DX: Other hemorrhoids: K64.8

## 2016-06-25 HISTORY — DX: Unspecified convulsions: R56.9

## 2016-06-25 HISTORY — DX: Other specified noninfective gastroenteritis and colitis: K52.89

## 2016-06-25 LAB — CBC WITH DIFFERENTIAL/PLATELET
BASOS PCT: 0 %
Basophils Absolute: 0 10*3/uL (ref 0.0–0.1)
EOS PCT: 0 %
Eosinophils Absolute: 0 10*3/uL (ref 0.0–0.7)
HEMATOCRIT: 42.5 % (ref 39.0–52.0)
HEMOGLOBIN: 14.3 g/dL (ref 13.0–17.0)
LYMPHS PCT: 11 %
Lymphs Abs: 2.5 10*3/uL (ref 0.7–4.0)
MCH: 28 pg (ref 26.0–34.0)
MCHC: 33.6 g/dL (ref 30.0–36.0)
MCV: 83.3 fL (ref 78.0–100.0)
Monocytes Absolute: 1.6 10*3/uL — ABNORMAL HIGH (ref 0.1–1.0)
Monocytes Relative: 7 %
NEUTROS PCT: 82 %
Neutro Abs: 19 10*3/uL — ABNORMAL HIGH (ref 1.7–7.7)
Platelets: 353 10*3/uL (ref 150–400)
RBC: 5.1 MIL/uL (ref 4.22–5.81)
RDW: 13.8 % (ref 11.5–15.5)
WBC: 23.1 10*3/uL — ABNORMAL HIGH (ref 4.0–10.5)

## 2016-06-25 LAB — I-STAT CHEM 8, ED
BUN: 16 mg/dL (ref 6–20)
CALCIUM ION: 1.04 mmol/L — AB (ref 1.15–1.40)
CHLORIDE: 100 mmol/L — AB (ref 101–111)
CREATININE: 0.8 mg/dL (ref 0.61–1.24)
GLUCOSE: 74 mg/dL (ref 65–99)
HCT: 45 % (ref 39.0–52.0)
Hemoglobin: 15.3 g/dL (ref 13.0–17.0)
Potassium: 3.4 mmol/L — ABNORMAL LOW (ref 3.5–5.1)
SODIUM: 140 mmol/L (ref 135–145)
TCO2: 26 mmol/L (ref 0–100)

## 2016-06-25 LAB — COMPREHENSIVE METABOLIC PANEL
ALBUMIN: 3.5 g/dL (ref 3.5–5.0)
ALT: 44 U/L (ref 17–63)
ANION GAP: 10 (ref 5–15)
AST: 36 U/L (ref 15–41)
Alkaline Phosphatase: 65 U/L (ref 38–126)
BUN: 16 mg/dL (ref 6–20)
CHLORIDE: 101 mmol/L (ref 101–111)
CO2: 26 mmol/L (ref 22–32)
Calcium: 9.1 mg/dL (ref 8.9–10.3)
Creatinine, Ser: 1 mg/dL (ref 0.61–1.24)
GFR calc non Af Amer: >60 mL/min (ref >60)
GLUCOSE: 73 mg/dL (ref 65–99)
Potassium: 3.7 mmol/L (ref 3.5–5.1)
SODIUM: 137 mmol/L (ref 135–145)
Total Bilirubin: 1.5 mg/dL — ABNORMAL HIGH (ref 0.3–1.2)
Total Protein: 7.8 g/dL (ref 6.5–8.1)

## 2016-06-25 LAB — I-STAT CG4 LACTIC ACID, ED: Lactic Acid, Venous: 1.13 mmol/L (ref 0.5–1.9)

## 2016-06-25 MED ORDER — SODIUM CHLORIDE 0.9 % IV BOLUS (SEPSIS)
1000.0000 mL | Freq: Once | INTRAVENOUS | Status: AC
  Administered 2016-06-25: 1000 mL via INTRAVENOUS

## 2016-06-25 MED ORDER — MORPHINE SULFATE (PF) 4 MG/ML IV SOLN
4.0000 mg | Freq: Once | INTRAVENOUS | Status: AC
  Administered 2016-06-25: 4 mg via INTRAVENOUS
  Filled 2016-06-25 (×2): qty 1

## 2016-06-25 MED ORDER — VANCOMYCIN HCL IN DEXTROSE 1-5 GM/200ML-% IV SOLN
1000.0000 mg | Freq: Once | INTRAVENOUS | Status: AC
  Administered 2016-06-25: 1000 mg via INTRAVENOUS
  Filled 2016-06-25 (×2): qty 200

## 2016-06-25 MED ORDER — FENTANYL CITRATE (PF) 100 MCG/2ML IJ SOLN
100.0000 ug | Freq: Once | INTRAMUSCULAR | Status: AC
  Administered 2016-06-25: 100 ug via INTRAVENOUS
  Filled 2016-06-25: qty 2

## 2016-06-25 MED ORDER — ONDANSETRON HCL 4 MG/2ML IJ SOLN
4.0000 mg | Freq: Once | INTRAMUSCULAR | Status: AC
  Administered 2016-06-25: 4 mg via INTRAVENOUS
  Filled 2016-06-25: qty 2

## 2016-06-25 NOTE — ED Triage Notes (Signed)
Patient presents for swelling noted to right forearm x2-3 days. Patient unsure of source, thinks it may an insect bite, no puncture wounds noted. No obvious deformity, skin color appropriate for ethnicity, warm to touch, cap refill <3 seconds.

## 2016-06-25 NOTE — Progress Notes (Addendum)
Right arm warm /red and swollen . Painful to the touch a 10/10. Positive radial pulse.  Pt denies injury and no apparent open wounds. Per pt his arm has increasingly become redder and more swollen over the last 4 days. 7:20pm -Spoke with EDP concerning pts arm and the level of pain. No intervention  at this time. (7:30pm)Pts right arm elevated on a pillow and an ice pack applied. (7:32pm)Pt given an update. Pt s/p 3 IV sticks.EDP in with the pt and performed an ultrsound at the bedside.  Presently pt has a 20g IV in his left AC. Also he has a 22g in  His left hand. NSS infusing via left 20 g IV -positional at times. Dr. Clarene Duke made aware we were only able to obtain one set of blood cultures due to limited IV access. Vancomycin to be hung . (9:20pm)Pt c/o nausea and per Dr.Little pt may have 4mg  of zofran IV. 9:40pm- Pt states he feels better. NSS infusing w/o difficulty as well as vancomycin - Pt requested a sandwich and a coca cola. (10:15pm)10:35pm -Pt c/o left arm pain a 10/10. EDP phoned and a verbal order for 4mg  of morphine ordered. Pt stated he is fine to take morphine and does not have any allergy to it. Pt has a visitor at the bedside. Pt received 4mg  of morphine IV- 10:55pm-Stated pain is a 7/10 now.11:20p- pt is asleep/ Report to [facility] on [address]. Pt will go to room 1605.

## 2016-06-25 NOTE — ED Provider Notes (Signed)
[place] DEPT Provider Note   CSN: [id number] Arrival date & time: 06/25/16  1649     History   Chief Complaint Chief Complaint  Patient presents with  . Cellulitis    HPI Pedro Owens is a 25 y.o. male.  The history is provided by the patient.  Patient presents with pain and swelling of his right forearm. States began 4 days ago and his been getting worse. States he's had chills. Worse with movement. No trauma to the area. No known injury. Has HIV but he has a good CD4 count. States he's been taking his medicines.  Past Medical History:  Diagnosis Date  . ADHD (attention deficit hyperactivity disorder)   . Anal warts   . Anxiety   . Chronic pain syndrome   . Depression   . Gonorrhea 09/08/11  . HIV infection ([place])   . Ileitis   . Lymphoid hyperplasia, reactive   . Marijuana abuse   . Pseudoseizures   . Seizures [facility])     Patient Active Problem List   Diagnosis Date Noted  . Cellulitis 06/25/2016  . HIV disease ([place]) 04/07/2016  . Cigarette smoker 04/07/2016  . Polysubstance abuse 04/07/2016  . Convulsions ([place]) 08/14/2015  . Depression 08/14/2015  . Conversion disorder 08/14/2015  . Hemorrhoids, internal, with bleeding 08/25/2011  . Anal warts 08/25/2011  . NAUSEA AND VOMITING 11/07/2010  . ANXIETY 09/24/2010  . ADHD 09/24/2010  . ILEITIS 09/24/2010  . CONSTIPATION 09/24/2010  . 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 Abdominal pain, chronic, epigastric 09/24/2010    Past Surgical History:  Procedure Laterality Date  . ANKLE SURGERY Bilateral 2005   Screw & Pins  . FOOT SURGERY Bilateral 2005   Screw & Pins       Home Medications    Prior to Admission medications   Medication Sig Start Date End Date Taking? Authorizing Provider  abacavir-dolutegravir-lamiVUDine (TRIUMEQ) 600-50-300 MG tablet Take 1 tablet by mouth daily. 04/16/16  Yes Michel Bickers, MD  desipramine (NORPRAMIN) 25 MG tablet Take 1 tablet (25 mg total) by mouth at  bedtime. Patient not taking: Reported on 06/25/2016 05/06/16   Manus Gunning, MD  Na Sulfate-K Sulfate-Mg Sulf [facility] BOWEL PREP KIT) 17.5-3.13-1.6 GM/180ML SOLN Use as directed Patient not taking: Reported on 06/25/2016 05/06/16   Manus Gunning, MD    Family History Family History  Problem Relation Age of Onset  . Diabetes Mother   . Irritable bowel syndrome Mother   . Hypertension Mother   . Hyperlipidemia Mother   . Hypothyroidism Mother   . Diabetes Maternal Uncle   . Heart disease Maternal Grandmother   . Leukemia Other     maternal greatgrandmother  . Colon cancer Neg Hx     Social History Social History  Substance Use Topics  . Smoking status: Current Every Day Smoker    Packs/day: 1.00    Years: 8.00    Types: Cigarettes  . Smokeless tobacco: Never Used  . Alcohol use 0.6 oz/week    1 Shots of liquor per week     Comment: Occ     Allergies   Acetaminophen; Vicodin [hydrocodone-acetaminophen]; and Dilaudid [hydromorphone hcl]   Review of Systems Review of Systems  Constitutional: Positive for chills and fever. Negative for activity change.  HENT: Negative for congestion.   Respiratory: Negative for shortness of breath.   Cardiovascular: Negative for chest pain.  Endocrine: Negative for polyuria.  Genitourinary: Negative for flank pain.  Musculoskeletal: Negative for back pain.  Skin: Positive for wound.  Neurological: Negative for seizures.  Hematological: Negative for adenopathy.     Physical Exam Updated Vital Signs BP 135/78 (BP Location: Left Arm)   Pulse 94   Temp (!) 101.6 F (38.7 C) (Oral)   Resp 16   SpO2 100%   Physical Exam  Constitutional: He appears well-developed.  HENT:  Head: Atraumatic.  Eyes: EOM are normal.  Neck: Neck supple.  Cardiovascular: Normal rate.   Pulmonary/Chest: Effort normal.  Abdominal: There is no tenderness.  Musculoskeletal:  Erythema and induration through most of right forearm. There is  approximately 1 cm more fluctuant area somewhat medially but bedside ultrasound did not show fluid collection below. Pain with palpation but good flexion extension at the wrists and strong radial pulse.  Neurological: He is alert.  Skin: Skin is warm.  Psychiatric: He has a normal mood and affect.     ED Treatments / Results  Labs (all labs ordered are listed, but only abnormal results are displayed) Labs Reviewed  COMPREHENSIVE METABOLIC PANEL - Abnormal; Notable for the following:       Result Value   Total Bilirubin 1.5 (*)    All other components within normal limits  CBC WITH DIFFERENTIAL/PLATELET - Abnormal; Notable for the following:    WBC 23.1 (*)    Neutro Abs 19.0 (*)    Monocytes Absolute 1.6 (*)    All other components within normal limits  I-STAT CHEM 8, ED - Abnormal; Notable for the following:    Potassium 3.4 (*)    Chloride 100 (*)    Calcium, Ion 1.04 (*)    All other components within normal limits  I-STAT CG4 LACTIC ACID, ED    EKG  EKG Interpretation None       Radiology No results found.  Procedures Procedures (including critical care time)  Medications Ordered in ED Medications  sodium chloride 0.9 % bolus 1,000 mL (1,000 mLs Intravenous New Bag/Given 06/25/16 2116)  vancomycin (VANCOCIN) IVPB 1000 mg/200 mL premix (0 mg Intravenous Stopped 06/25/16 2220)  fentaNYL (SUBLIMAZE) injection 100 mcg (100 mcg Intravenous Given 06/25/16 2116)  ondansetron (ZOFRAN) injection 4 mg (4 mg Intravenous Given 06/25/16 2138)  morphine 4 MG/ML injection 4 mg (4 mg Intravenous Given 06/25/16 2254)     Initial Impression / Assessment and Plan / ED Course  I have reviewed the triage vital signs and the nursing notes.  Pertinent labs & imaging results that were available during my care of the patient were reviewed by me and considered in my medical decision making (see chart for details).  Clinical Course    Patient with apparent cellulitis of right forearm. No  clear abscess. Szafran count elevated. Denies IV drug use. Will admit to internal medicine.  Final Clinical Impressions(s) / ED Diagnoses   Final diagnoses:  Right forearm cellulitis    New Prescriptions Current Discharge Medication List       Davonna Belling, MD 06/26/16 0020

## 2016-06-26 ENCOUNTER — Inpatient Hospital Stay (HOSPITAL_COMMUNITY): Payer: BLUE CROSS/BLUE SHIELD

## 2016-06-26 ENCOUNTER — Encounter (HOSPITAL_COMMUNITY): Payer: Self-pay | Admitting: Internal Medicine

## 2016-06-26 ENCOUNTER — Encounter (HOSPITAL_COMMUNITY)
Admission: EM | Disposition: A | Discharge status: Home Health | Payer: Self-pay | Source: Home / Self Care | Attending: Internal Medicine

## 2016-06-26 ENCOUNTER — Inpatient Hospital Stay (HOSPITAL_COMMUNITY): Payer: BLUE CROSS/BLUE SHIELD | Admitting: Certified Registered"

## 2016-06-26 DIAGNOSIS — L03113 Cellulitis of right upper limb: Secondary | ICD-10-CM

## 2016-06-26 DIAGNOSIS — M79631 Pain in right forearm: Secondary | ICD-10-CM | POA: Diagnosis present

## 2016-06-26 HISTORY — PX: I & D EXTREMITY: SHX5045

## 2016-06-26 LAB — CBC WITH DIFFERENTIAL/PLATELET
BASOS ABS: 0 10*3/uL (ref 0.0–0.1)
Basophils Relative: 0 %
EOS PCT: 0 %
Eosinophils Absolute: 0 10*3/uL (ref 0.0–0.7)
HEMATOCRIT: 37.9 % — AB (ref 39.0–52.0)
Hemoglobin: 12.7 g/dL — ABNORMAL LOW (ref 13.0–17.0)
LYMPHS ABS: 2.4 10*3/uL (ref 0.7–4.0)
LYMPHS PCT: 13 %
MCH: 28.2 pg (ref 26.0–34.0)
MCHC: 33.5 g/dL (ref 30.0–36.0)
MCV: 84 fL (ref 78.0–100.0)
MONOS PCT: 7 %
Monocytes Absolute: 1.3 10*3/uL — ABNORMAL HIGH (ref 0.1–1.0)
NEUTROS PCT: 80 %
Neutro Abs: 14.7 10*3/uL — ABNORMAL HIGH (ref 1.7–7.7)
Platelets: 330 10*3/uL (ref 150–400)
RBC: 4.51 MIL/uL (ref 4.22–5.81)
RDW: 13.9 % (ref 11.5–15.5)
WBC: 18.4 10*3/uL — AB (ref 4.0–10.5)

## 2016-06-26 LAB — CREATININE, SERUM
Creatinine, Ser: 0.92 mg/dL (ref 0.61–1.24)
GFR calc Af Amer: >60 mL/min (ref >60)

## 2016-06-26 LAB — COMPREHENSIVE METABOLIC PANEL
ALBUMIN: 3.3 g/dL — AB (ref 3.5–5.0)
ALK PHOS: 63 U/L (ref 38–126)
ALT: 36 U/L (ref 17–63)
ANION GAP: 6 (ref 5–15)
AST: 32 U/L (ref 15–41)
BILIRUBIN TOTAL: 1 mg/dL (ref 0.3–1.2)
BUN: 14 mg/dL (ref 6–20)
CO2: 28 mmol/L (ref 22–32)
Calcium: 8.2 mg/dL — ABNORMAL LOW (ref 8.9–10.3)
Chloride: 104 mmol/L (ref 101–111)
Creatinine, Ser: 1.03 mg/dL (ref 0.61–1.24)
GFR calc Af Amer: >60 mL/min (ref >60)
GFR calc non Af Amer: >60 mL/min (ref >60)
GLUCOSE: 95 mg/dL (ref 65–99)
Potassium: 3.5 mmol/L (ref 3.5–5.1)
Sodium: 138 mmol/L (ref 135–145)
TOTAL PROTEIN: 6.9 g/dL (ref 6.5–8.1)

## 2016-06-26 LAB — CBC
HCT: 34.5 % — ABNORMAL LOW (ref 39.0–52.0)
Hemoglobin: 11.3 g/dL — ABNORMAL LOW (ref 13.0–17.0)
MCH: 27.6 pg (ref 26.0–34.0)
MCHC: 32.8 g/dL (ref 30.0–36.0)
MCV: 84.4 fL (ref 78.0–100.0)
Platelets: 322 10*3/uL (ref 150–400)
RBC: 4.09 MIL/uL — ABNORMAL LOW (ref 4.22–5.81)
RDW: 14 % (ref 11.5–15.5)
WBC: 20.6 10*3/uL — ABNORMAL HIGH (ref 4.0–10.5)

## 2016-06-26 LAB — SEDIMENTATION RATE: SED RATE: 10 mm/h (ref 0–16)

## 2016-06-26 SURGERY — IRRIGATION AND DEBRIDEMENT EXTREMITY
Anesthesia: General | Laterality: Right

## 2016-06-26 MED ORDER — LACTATED RINGERS IV SOLN
INTRAVENOUS | Status: DC | PRN
  Administered 2016-06-26 (×2): via INTRAVENOUS

## 2016-06-26 MED ORDER — MORPHINE SULFATE (PF) 2 MG/ML IV SOLN
2.0000 mg | INTRAVENOUS | Status: DC | PRN
  Administered 2016-06-26 – 2016-06-29 (×24): 2 mg via INTRAVENOUS
  Filled 2016-06-26 (×23): qty 1

## 2016-06-26 MED ORDER — IBUPROFEN 400 MG PO TABS
400.0000 mg | ORAL_TABLET | Freq: Once | ORAL | Status: AC
  Administered 2016-06-26: 400 mg via ORAL

## 2016-06-26 MED ORDER — MIDAZOLAM HCL 2 MG/2ML IJ SOLN
INTRAMUSCULAR | Status: AC
  Filled 2016-06-26: qty 2

## 2016-06-26 MED ORDER — PROPOFOL 10 MG/ML IV BOLUS
INTRAVENOUS | Status: AC
  Filled 2016-06-26: qty 20

## 2016-06-26 MED ORDER — PROMETHAZINE HCL 25 MG/ML IJ SOLN: 6.2500 mg | INTRAMUSCULAR | Status: DC | PRN

## 2016-06-26 MED ORDER — MORPHINE SULFATE (PF) 2 MG/ML IV SOLN
2.0000 mg | Freq: Once | INTRAVENOUS | Status: AC
  Administered 2016-06-26: 2 mg via INTRAVENOUS

## 2016-06-26 MED ORDER — FENTANYL CITRATE (PF) 100 MCG/2ML IJ SOLN
INTRAMUSCULAR | Status: AC
  Filled 2016-06-26: qty 2

## 2016-06-26 MED ORDER — MORPHINE SULFATE (PF) 2 MG/ML IV SOLN
2.0000 mg | INTRAVENOUS | Status: DC | PRN
  Filled 2016-06-26 (×2): qty 1

## 2016-06-26 MED ORDER — FENTANYL CITRATE (PF) 100 MCG/2ML IJ SOLN: 25.0000 ug | INTRAMUSCULAR | Status: DC | PRN

## 2016-06-26 MED ORDER — VANCOMYCIN HCL 10 G IV SOLR
1500.0000 mg | Freq: Two times a day (BID) | INTRAVENOUS | Status: DC
  Administered 2016-06-26 – 2016-06-28 (×6): 1500 mg via INTRAVENOUS
  Filled 2016-06-26 (×6): qty 1500

## 2016-06-26 MED ORDER — LACTATED RINGERS IV SOLN
INTRAVENOUS | Status: DC
  Administered 2016-06-26 – 2016-06-27 (×2): via INTRAVENOUS

## 2016-06-26 MED ORDER — MIDAZOLAM HCL 5 MG/5ML IJ SOLN
INTRAMUSCULAR | Status: DC | PRN
  Administered 2016-06-26: 2 mg via INTRAVENOUS

## 2016-06-26 MED ORDER — ONDANSETRON HCL 4 MG PO TABS
4.0000 mg | ORAL_TABLET | Freq: Four times a day (QID) | ORAL | Status: DC | PRN
  Administered 2016-06-29: 4 mg via ORAL
  Filled 2016-06-26: qty 1

## 2016-06-26 MED ORDER — ABACAVIR-DOLUTEGRAVIR-LAMIVUD 600-50-300 MG PO TABS
1.0000 | ORAL_TABLET | Freq: Every day | ORAL | Status: DC
  Administered 2016-06-26 – 2016-07-03 (×8): 1 via ORAL
  Filled 2016-06-26 (×9): qty 1

## 2016-06-26 MED ORDER — ENOXAPARIN SODIUM 40 MG/0.4ML ~~LOC~~ SOLN
40.0000 mg | SUBCUTANEOUS | Status: DC
  Administered 2016-06-27 – 2016-07-03 (×7): 40 mg via SUBCUTANEOUS
  Filled 2016-06-26 (×7): qty 0.4

## 2016-06-26 MED ORDER — BUPIVACAINE HCL (PF) 0.25 % IJ SOLN
INTRAMUSCULAR | Status: AC
  Filled 2016-06-26: qty 30

## 2016-06-26 MED ORDER — SODIUM CHLORIDE 0.9 % IV SOLN
INTRAVENOUS | Status: DC
  Administered 2016-06-26: 02:00:00 via INTRAVENOUS

## 2016-06-26 MED ORDER — LIDOCAINE HCL (CARDIAC) 20 MG/ML IV SOLN
INTRAVENOUS | Status: DC | PRN
  Administered 2016-06-26: 100 mg via INTRAVENOUS

## 2016-06-26 MED ORDER — IBUPROFEN 200 MG PO TABS
400.0000 mg | ORAL_TABLET | Freq: Four times a day (QID) | ORAL | Status: DC | PRN
  Administered 2016-06-26: 400 mg via ORAL
  Filled 2016-06-26: qty 1
  Filled 2016-06-26: qty 2

## 2016-06-26 MED ORDER — PROPOFOL 10 MG/ML IV BOLUS
INTRAVENOUS | Status: DC | PRN
  Administered 2016-06-26: 200 mg via INTRAVENOUS

## 2016-06-26 MED ORDER — OXYCODONE HCL 5 MG PO TABS
5.0000 mg | ORAL_TABLET | ORAL | Status: DC | PRN
  Administered 2016-06-26: 5 mg via ORAL
  Filled 2016-06-26: qty 1

## 2016-06-26 MED ORDER — 0.9 % SODIUM CHLORIDE (POUR BTL) OPTIME
TOPICAL | Status: DC | PRN
  Administered 2016-06-26: 1000 mL

## 2016-06-26 MED ORDER — PIPERACILLIN-TAZOBACTAM 3.375 G IVPB
3.3750 g | Freq: Three times a day (TID) | INTRAVENOUS | Status: DC
  Administered 2016-06-26 – 2016-06-29 (×9): 3.375 g via INTRAVENOUS
  Filled 2016-06-26 (×11): qty 50

## 2016-06-26 MED ORDER — IBUPROFEN 400 MG PO TABS
400.0000 mg | ORAL_TABLET | Freq: Four times a day (QID) | ORAL | Status: DC | PRN
  Administered 2016-06-26: 400 mg via ORAL
  Filled 2016-06-26: qty 1

## 2016-06-26 MED ORDER — ONDANSETRON HCL 4 MG/2ML IJ SOLN: 4.0000 mg | Freq: Four times a day (QID) | INTRAMUSCULAR | Status: DC | PRN

## 2016-06-26 MED ORDER — ONDANSETRON HCL 4 MG/2ML IJ SOLN
INTRAMUSCULAR | Status: DC | PRN
  Administered 2016-06-26: 4 mg via INTRAVENOUS

## 2016-06-26 MED ORDER — IOPAMIDOL (ISOVUE-300) INJECTION 61%
100.0000 mL | Freq: Once | INTRAVENOUS | Status: AC | PRN
  Administered 2016-06-26: 100 mL via INTRAVENOUS

## 2016-06-26 MED ORDER — VITAMIN C 500 MG PO TABS
1000.0000 mg | ORAL_TABLET | Freq: Every day | ORAL | Status: DC
  Administered 2016-06-26 – 2016-07-03 (×8): 1000 mg via ORAL
  Filled 2016-06-26 (×8): qty 2

## 2016-06-26 MED ORDER — FENTANYL CITRATE (PF) 100 MCG/2ML IJ SOLN
INTRAMUSCULAR | Status: DC | PRN
  Administered 2016-06-26 (×7): 25 ug via INTRAVENOUS
  Administered 2016-06-26: 50 ug via INTRAVENOUS
  Administered 2016-06-26: 25 ug via INTRAVENOUS

## 2016-06-26 SURGICAL SUPPLY — 57 items
BANDAGE ACE 3X5.8 VEL STRL LF (GAUZE/BANDAGES/DRESSINGS) ×2 IMPLANT
BANDAGE ACE 4X5 VEL STRL LF (GAUZE/BANDAGES/DRESSINGS) ×1 IMPLANT
BANDAGE COBAN STERILE 2 (GAUZE/BANDAGES/DRESSINGS) IMPLANT
BANDAGE ELASTIC 3 VELCRO ST LF (GAUZE/BANDAGES/DRESSINGS) ×1 IMPLANT
BNDG CMPR 9X4 STRL LF SNTH (GAUZE/BANDAGES/DRESSINGS) ×1
BNDG COHESIVE 1X5 TAN STRL LF (GAUZE/BANDAGES/DRESSINGS) IMPLANT
BNDG CONFORM 2 STRL LF (GAUZE/BANDAGES/DRESSINGS) IMPLANT
BNDG ESMARK 4X9 LF (GAUZE/BANDAGES/DRESSINGS) ×2 IMPLANT
BNDG GAUZE ELAST 4 BULKY (GAUZE/BANDAGES/DRESSINGS) ×3 IMPLANT
CORDS BIPOLAR (ELECTRODE) ×3 IMPLANT
COVER SURGICAL LIGHT HANDLE (MISCELLANEOUS) ×3 IMPLANT
DECANTER SPIKE VIAL GLASS SM (MISCELLANEOUS) ×1 IMPLANT
DRAIN PENROSE 1/4X12 LTX STRL (WOUND CARE) IMPLANT
DRSG ADAPTIC 3X8 NADH LF (GAUZE/BANDAGES/DRESSINGS) IMPLANT
DRSG EMULSION OIL 3X3 NADH (GAUZE/BANDAGES/DRESSINGS) ×1 IMPLANT
DRSG PAD ABDOMINAL 8X10 ST (GAUZE/BANDAGES/DRESSINGS) ×4 IMPLANT
GAUZE IODOFORM PACK 1/2 7832 (GAUZE/BANDAGES/DRESSINGS) ×2 IMPLANT
GAUZE SPONGE 4X4 12PLY STRL (GAUZE/BANDAGES/DRESSINGS) ×1 IMPLANT
GAUZE SPONGE 4X4 16PLY XRAY LF (GAUZE/BANDAGES/DRESSINGS) ×2 IMPLANT
GAUZE XEROFORM 1X8 LF (GAUZE/BANDAGES/DRESSINGS) ×1 IMPLANT
GLOVE BIO SURGEON STRL SZ7.5 (GLOVE) ×3 IMPLANT
GLOVE BIOGEL PI IND STRL 8 (GLOVE) ×1 IMPLANT
GLOVE BIOGEL PI INDICATOR 8 (GLOVE) ×2
GOWN STRL REUS W/ TWL LRG LVL3 (GOWN DISPOSABLE) ×1 IMPLANT
GOWN STRL REUS W/TWL LRG LVL3 (GOWN DISPOSABLE) ×3
KIT BASIN OR (CUSTOM PROCEDURE TRAY) ×3 IMPLANT
KIT ROOM TURNOVER OR (KITS) ×3 IMPLANT
LOOP VESSEL MAXI BLUE (MISCELLANEOUS) IMPLANT
MANIFOLD NEPTUNE II (INSTRUMENTS) ×3 IMPLANT
NDL HYPO 25X1 1.5 SAFETY (NEEDLE) IMPLANT
NEEDLE HYPO 25X1 1.5 SAFETY (NEEDLE) IMPLANT
NS IRRIG 1000ML POUR BTL (IV SOLUTION) ×3 IMPLANT
PACK ORTHO EXTREMITY (CUSTOM PROCEDURE TRAY) ×3 IMPLANT
PAD ABD 8X10 STRL (GAUZE/BANDAGES/DRESSINGS) ×2 IMPLANT
PAD ARMBOARD 7.5X6 YLW CONV (MISCELLANEOUS) ×6 IMPLANT
PAD CAST 3X4 CTTN HI CHSV (CAST SUPPLIES) IMPLANT
PADDING CAST COTTON 3X4 STRL (CAST SUPPLIES) ×6
SCRUB BETADINE 4OZ XXX (MISCELLANEOUS) ×3 IMPLANT
SET CYSTO W/LG BORE CLAMP LF (SET/KITS/TRAYS/PACK) ×3 IMPLANT
SOLUTION BETADINE 4OZ (MISCELLANEOUS) ×3 IMPLANT
SPLINT PLASTER CAST XFAST 5X30 (CAST SUPPLIES) IMPLANT
SPLINT PLASTER XFAST SET 5X30 (CAST SUPPLIES) ×2
SPONGE GAUZE 4X4 12PLY STER LF (GAUZE/BANDAGES/DRESSINGS) ×2 IMPLANT
SPONGE LAP 4X18 X RAY DECT (DISPOSABLE) ×1 IMPLANT
SUT ETHILON 4 0 P 3 18 (SUTURE) IMPLANT
SUT ETHILON 4 0 PS 2 18 (SUTURE) ×1 IMPLANT
SUT MON AB 5-0 P3 18 (SUTURE) IMPLANT
SYR CONTROL 10ML LL (SYRINGE) IMPLANT
TOWEL OR 17X24 6PK STRL BLUE (TOWEL DISPOSABLE) ×3 IMPLANT
TOWEL OR 17X26 10 PK STRL BLUE (TOWEL DISPOSABLE) ×3 IMPLANT
TUBE ANAEROBIC SPECIMEN COL (MISCELLANEOUS) IMPLANT
TUBE CONNECTING 12'X1/4 (SUCTIONS) ×1
TUBE CONNECTING 12X1/4 (SUCTIONS) ×2 IMPLANT
TUBE FEEDING 5FR 15 INCH (TUBING) IMPLANT
UNDERPAD 30X30 (UNDERPADS AND DIAPERS) ×3 IMPLANT
WATER STERILE IRR 1000ML POUR (IV SOLUTION) ×1 IMPLANT
YANKAUER SUCT BULB TIP NO VENT (SUCTIONS) ×1 IMPLANT

## 2016-06-26 NOTE — OR Nursing (Signed)
All charting done since 1450 was completed by Cherylann Parr RN, even though it is documented as being completed by Forest Becker RN.  Forest Becker was still logged in the computer while Cherylann Parr was completing the charting.

## 2016-06-26 NOTE — Anesthesia Preprocedure Evaluation (Addendum)
Anesthesia Evaluation  Patient identified by MRN, date of birth, ID band Patient awake    Reviewed: Allergy & Precautions, NPO status , Patient's Chart, lab work & pertinent test results  Airway Mallampati: II  TM Distance: >3 FB Neck ROM: Full    Dental  (+) Teeth Intact, Dental Advisory Given   Pulmonary neg pulmonary ROS, Current Smoker,    Pulmonary exam normal breath sounds clear to auscultation       Cardiovascular Exercise Tolerance: Good negative cardio ROS Normal cardiovascular exam Rhythm:Regular Rate:Normal     Neuro/Psych Seizures -,  PSYCHIATRIC DISORDERS Anxiety Depression    GI/Hepatic negative GI ROS, (+)     substance abuse (last cocaine use >2.56months ago)  cocaine use and marijuana use, Ileitis   Endo/Other  negative endocrine ROS  Renal/GU negative Renal ROS     Musculoskeletal negative musculoskeletal ROS (+)   Abdominal   Peds  (+) ADHD Hematology  (+) Blood dyscrasia, anemia , HIV,   Anesthesia Other Findings Day of surgery medications reviewed with the patient.  Reproductive/Obstetrics                            Anesthesia Physical Anesthesia Plan  ASA: III  Anesthesia Plan: General   Post-op Pain Management:    Induction: Intravenous  Airway Management Planned: LMA  Additional Equipment:   Intra-op Plan:   Post-operative Plan: Extubation in OR  Informed Consent: I have reviewed the patients History and Physical, chart, labs and discussed the procedure including the risks, benefits and alternatives for the proposed anesthesia with the patient or authorized representative who has indicated his/her understanding and acceptance.   Dental advisory given  Plan Discussed with: CRNA  Anesthesia Plan Comments: (Risks/benefits of general anesthesia discussed with patient including risk of damage to teeth, lips, gum, and tongue, nausea/vomiting, allergic  reactions to medications, and the possibility of heart attack, stroke and death.  All patient questions answered.  Patient wishes to proceed.)        Anesthesia Quick Evaluation

## 2016-06-26 NOTE — Progress Notes (Signed)
Pharmacy Antibiotic Note  Pedro Owens is a 25 y.o. male admitted on 06/25/2016 with cellulitis.  Pharmacy has been consulted for zosyn/vancomycin dosing.  Plan: Zosyn 3.375 Gm IV q8h EI Vancomycin 1 Gm x1 ED then 1500mg  IV q12h (VT=10-15 mg/L)  Height: 5' 11.5" (181.6 cm) Weight: 164 lb (74.4 kg) IBW/kg (Calculated) : 76.45  Temp (24hrs), Avg:101.4 F (38.6 C), Min:100 F (37.8 C), Max:102.8 F (39.3 C)   Recent Labs Lab 06/25/16 1820 06/25/16 2029 06/25/16 2117  WBC 23.1*  --   --   CREATININE 1.00 0.80  --   LATICACIDVEN  --   --  1.13    Estimated Creatinine Clearance: 149.8 mL/min (by C-G formula based on SCr of 0.8 mg/dL).    Allergies  Allergen Reactions  . Acetaminophen Diarrhea    Flares up crohns disease  . Vicodin [Hydrocodone-Acetaminophen] Nausea And Vomiting  . Dilaudid [Hydromorphone Hcl] Hives and Rash    Antimicrobials this admission: 9/8 zosyn >>  9/8  vacomycin >>   Dose adjustments this admission:   Microbiology results:  BCx:   UCx:    Sputum:    MRSA PCR:   Thank you for allowing pharmacy to be a part of this patient's care.  Lorenza Evangelist 06/26/2016 2:46 AM

## 2016-06-26 NOTE — Progress Notes (Addendum)
Progress Note    Pedro Owens  ZOX:[id number] DOB: 11/09/1990  DOA: 06/25/2016 PCP: Diamantina Providenceakela N Anderson, FNP    Brief Narrative:   Chief complaint: Follow-up right arm cellulitis  Pedro Owens is an 25 y.o. male with a PMH of HIV, last CD4 count 798/2017, who was admitted 06/25/16 with a chief complaint of right forearm pain and swelling.  Assessment/Plan:   Principal Problem:   Right forearm cellulitis/Abscess Empirically placed on vancomycin/Zosyn. Follow-up blood cultures. CT of the forearm showed a large 9.1 X5.9X 2.0 cm abscess at the ulnar aspect of the midforearm. Hand surgeon subsequently consulted with plans to take to surgery for I&D. WBC remains elevated, but trend is down.  Active Problems:   HIV disease (HCC) Followed by Dr. Orvan Falconerampbell in the ID clinic. Continue Triumeq.    Cocaine abuse/polysubstance abuse Patient does have a history of cocaine and marijuana abuse. Urine drug screen pending.   Family Communication/Anticipated D/C date and plan/Code Status   DVT prophylaxis: SCDs ordered. Code Status: Full Code.  Family Communication: Multiple family at the bedside. Disposition Plan: Home when medically stable postoperatively.   Medical Consultants:    Hand Surgery   Procedures:    None  Anti-Infectives:    Vancomycin 06/25/16--->  Zosyn 06/25/16--->  Subjective:   The patient is sedated, postoperatively.  Objective:    Vitals:   06/25/16 2352 06/26/16 0335 06/26/16 0510 06/26/16 0602  BP: 135/78 (!) 117/53  (!) 111/57  Pulse: 94 100  79  Resp: 16 16  16   Temp: (!) 101.6 F (38.7 C) (!) 102.8 F (39.3 C) 99.4 F (37.4 C) 98.4 F (36.9 C)  TempSrc: Oral Oral Oral Oral  SpO2: 100% 99%  100%  Weight: 74.4 kg (164 lb)     Height: 5' 11.5" (1.816 m)       Intake/Output Summary (Last 24 hours) at 06/26/16 0912 Last data filed at 06/26/16 0655  Gross per 24 hour  Intake             1205 ml  Output                0 ml  Net              1205 ml   Filed Weights   06/25/16 2352  Weight: 74.4 kg (164 lb)    Exam: Gen: Sedated. Chest: Lungs clear to auscultation bilaterally with good air movement. Heart: Regular rate, and rhythm. No murmurs, rubs, or gallops. Extremities: Right arm wrapped in an Ace wrap.   Data Reviewed:   I have personally reviewed following labs and imaging studies:  Labs: Basic Metabolic Panel:  Recent Labs Lab 06/25/16 1820 06/25/16 2029 06/26/16 0132  NA 137 140 138  K 3.7 3.4* 3.5  CL 101 100* 104  CO2 26  --  28  GLUCOSE 73 74 95  BUN 16 16 14   CREATININE 1.00 0.80 1.03  CALCIUM 9.1  --  8.2*   GFR Estimated Creatinine Clearance: 116.4 mL/min (by C-G formula based on SCr of 1.03 mg/dL). Liver Function Tests:  Recent Labs Lab 06/25/16 1820 06/26/16 0132  AST 36 32  ALT 44 36  ALKPHOS 65 63  BILITOT 1.5* 1.0  PROT 7.8 6.9  ALBUMIN 3.5 3.3*    CBC:  Recent Labs Lab 06/25/16 1820 06/25/16 2029 06/26/16 0132  WBC 23.1*  --  18.4*  NEUTROABS 19.0*  --  14.7*  HGB 14.3 15.3 12.7*  HCT  42.5 45.0 37.9*  MCV 83.3  --  84.0  PLT 353  --  330   Sepsis Labs:  Recent Labs Lab 06/25/16 1820 06/25/16 2117 06/26/16 0132  WBC 23.1*  --  18.4*  LATICACIDVEN  --  1.13  --     Microbiology Recent Results (from the past 240 hour(s))  Culture, blood (routine x 2)     Status: None (Preliminary result)   Collection Time: 06/26/16  1:42 AM  Result Value Ref Range Status   Specimen Description LEFT ANTECUBITAL  Final   Special Requests BOTTLES DRAWN AEROBIC ONLY 6CC  Final   Culture   Final    NO GROWTH <12 HOURS Performed at [facility]    Report Status PENDING  Incomplete  Culture, blood (routine x 2)     Status: None (Preliminary result)   Collection Time: 06/26/16  1:42 AM  Result Value Ref Range Status   Specimen Description BLOOD LEFT ARM  Final   Special Requests BOTTLES DRAWN AEROBIC ONLY 8CC  Final   Culture   Final    NO GROWTH <12  HOURS Performed at [facility]    Report Status PENDING  Incomplete    Radiology: Ct Forearm Right W Contrast  Result Date: 06/26/2016 CLINICAL DATA:  Acute onset of right forearm pain, erythema and swelling. Fever. Initial encounter. EXAM: CT OF THE RIGHT FOREARM WITH CONTRAST TECHNIQUE: Multidetector CT imaging was performed following the standard protocol during bolus administration of intravenous contrast. CONTRAST:  ISOVUE-300 IOPAMIDOL (ISOVUE-300) INJECTION 61% COMPARISON:  Right forearm radiographs performed 03/09/2008 FINDINGS: There is a large 9.1 x 5.8 x 2.0 cm peripherally enhancing abscess at the ulnar aspect of the mid forearm, with overlying diffuse soft tissue edema extending along much of the ulnar and dorsal forearm. This appears to arise within the musculature, and is mildly complex in appearance. There is some degree of effacement of overlying vasculature. No acute osseous abnormalities are seen. There is no evidence of osseous erosion. The carpal rows appear grossly intact, and demonstrate normal alignment. No elbow joint effusion is identified. IMPRESSION: Large 9.1 x 5.9 x 2.0 cm peripherally enhancing abscess at the ulnar aspect of the mid forearm, with overlying diffuse soft tissue edema involving much of the ulnar and dorsal forearm. The abscess arises within the musculature, and is mildly complex in appearance. Some degree of effacement of overlying musculature. Electronically Signed   By: Roanna Raider M.D.   On: 06/26/2016 05:00    Medications:   . abacavir-dolutegravir-lamiVUDine  1 tablet Oral Daily  . piperacillin-tazobactam (ZOSYN)  IV  3.375 g Intravenous Q8H  . vancomycin  1,500 mg Intravenous Q12H   Continuous Infusions: . sodium chloride 100 mL/hr at 06/26/16 0153    No charge    LOS: 1 day   RAMA,CHRISTINA  [facility] Pager [phone]. If unable to reach me by pager, please call my cell phone at [phone].  *Please refer to  [email or web address], password [email or web address] to get updated schedule on who will round on this patient, as hospitalists switch teams weekly. If 7PM-7AM, please contact night-coverage at [email or web address], password [email or web address] for any overnight needs.  06/26/2016, 9:12 AM

## 2016-06-26 NOTE — H&P (Addendum)
Pedro Owens is an 25 y.o. male.   Chief Complaint: right arm infection  HPI: 25 yo rhd male states he has been having problems with right arm for four days.  This has been progressively worsening.  He describes a throbbing pain that is 10/10 in severity.  It is worsened with ice and palpation and alleviated by nothing.  He has had fevers, chills, and night sweats.  States he had no wounds and has not been injecting.  Case discussed with Rise Patience, MD and his note from 06/26/2016 reviewed. Xrays viewed and interpreted by me: CT right forearm shows fluid collection at ulnar side of forearm in deep tissues. Labs reviewed: WBC 18.4  Allergies:  Allergies  Allergen Reactions  . Acetaminophen Diarrhea    Flares up crohns disease  . Vicodin [Hydrocodone-Acetaminophen] Nausea And Vomiting  . Dilaudid [Hydromorphone Hcl] Hives and Rash    Past Medical History:  Diagnosis Date  . ADHD (attention deficit hyperactivity disorder)   . Anal warts   . Anxiety   . Chronic pain syndrome   . Depression   . Gonorrhea 09/08/11  . HIV infection ([place])   . Ileitis   . ILEITIS 09/24/2010   Qualifier: Diagnosis of  By: Nelson-Smith CMA (AAMA), Dottie    . Lymphoid hyperplasia, reactive   . Marijuana abuse   . Pseudoseizures   . Seizures ([place])     Past Surgical History:  Procedure Laterality Date  . ANKLE SURGERY Bilateral 2005   Screw & Pins  . FOOT SURGERY Bilateral 2005   Screw & Pins    Family History: Family History  Problem Relation Age of Onset  . Diabetes Mother   . Irritable bowel syndrome Mother   . Hypertension Mother   . Hyperlipidemia Mother   . Hypothyroidism Mother   . Diabetes Maternal Uncle   . Heart disease Maternal Grandmother   . Leukemia Other     maternal greatgrandmother  . Colon cancer Neg Hx     Social History:   reports that he has been smoking Cigarettes.  He has a 8.00 pack-year smoking history. He has never used smokeless tobacco. He reports  that he drinks about 0.6 oz of alcohol per week . He reports that he uses drugs, including Cocaine, Benzodiazepines, and Oxycodone, about 2 times per week.  Medications: Medications Prior to Admission  Medication Sig Dispense Refill  . abacavir-dolutegravir-lamiVUDine (TRIUMEQ) 600-50-300 MG tablet Take 1 tablet by mouth daily. 30 tablet 11  . desipramine (NORPRAMIN) 25 MG tablet Take 1 tablet (25 mg total) by mouth at bedtime. (Patient not taking: Reported on 06/25/2016) 90 tablet 3  . Na Sulfate-K Sulfate-Mg Sulf (SUPREP BOWEL PREP KIT) 17.5-3.13-1.6 GM/180ML SOLN Use as directed (Patient not taking: Reported on 06/25/2016) 2 Bottle 0    Results for orders placed or performed during the hospital encounter of 06/25/16 (from the past 48 hour(s))  Comprehensive metabolic panel     Status: Abnormal   Collection Time: 06/25/16  6:20 PM  Result Value Ref Range   Sodium 137 135 - 145 mmol/L   Potassium 3.7 3.5 - 5.1 mmol/L   Chloride 101 101 - 111 mmol/L   CO2 26 22 - 32 mmol/L   Glucose, Bld 73 65 - 99 mg/dL   BUN 16 6 - 20 mg/dL   Creatinine, Ser 1.00 0.61 - 1.24 mg/dL   Calcium 9.1 8.9 - 10.3 mg/dL   Total Protein 7.8 6.5 - 8.1 g/dL   Albumin 3.5  3.5 - 5.0 g/dL   AST 36 15 - 41 U/L   ALT 44 17 - 63 U/L   Alkaline Phosphatase 65 38 - 126 U/L   Total Bilirubin 1.5 (H) 0.3 - 1.2 mg/dL   GFR calc non Af Amer >60 >60 mL/min   GFR calc Af Amer >60 >60 mL/min    Comment: (NOTE) The eGFR has been calculated using the CKD EPI equation. This calculation has not been validated in all clinical situations. eGFR's persistently <60 mL/min signify possible Chronic Kidney Disease.    Anion gap 10 5 - 15  CBC with Differential     Status: Abnormal   Collection Time: 06/25/16  6:20 PM  Result Value Ref Range   WBC 23.1 (H) 4.0 - 10.5 K/uL   RBC 5.10 4.22 - 5.81 MIL/uL   Hemoglobin 14.3 13.0 - 17.0 g/dL   HCT 42.5 39.0 - 52.0 %   MCV 83.3 78.0 - 100.0 fL   MCH 28.0 26.0 - 34.0 pg   MCHC 33.6 30.0  - 36.0 g/dL   RDW 13.8 11.5 - 15.5 %   Platelets 353 150 - 400 K/uL   Neutrophils Relative % 82 %   Lymphocytes Relative 11 %   Monocytes Relative 7 %   Eosinophils Relative 0 %   Basophils Relative 0 %   Neutro Abs 19.0 (H) 1.7 - 7.7 K/uL   Lymphs Abs 2.5 0.7 - 4.0 K/uL   Monocytes Absolute 1.6 (H) 0.1 - 1.0 K/uL   Eosinophils Absolute 0.0 0.0 - 0.7 K/uL   Basophils Absolute 0.0 0.0 - 0.1 K/uL   WBC Morphology VACUOLATED NEUTROPHILS   I-stat Chem 8, ED     Status: Abnormal   Collection Time: 06/25/16  8:29 PM  Result Value Ref Range   Sodium 140 135 - 145 mmol/L   Potassium 3.4 (L) 3.5 - 5.1 mmol/L   Chloride 100 (L) 101 - 111 mmol/L   BUN 16 6 - 20 mg/dL   Creatinine, Ser 0.80 0.61 - 1.24 mg/dL   Glucose, Bld 74 65 - 99 mg/dL   Calcium, Ion 1.04 (L) 1.15 - 1.40 mmol/L   TCO2 26 0 - 100 mmol/L   Hemoglobin 15.3 13.0 - 17.0 g/dL   HCT 45.0 39.0 - 52.0 %  I-Stat CG4 Lactic Acid, ED     Status: None   Collection Time: 06/25/16  9:17 PM  Result Value Ref Range   Lactic Acid, Venous 1.13 0.5 - 1.9 mmol/L  Sedimentation rate     Status: None   Collection Time: 06/26/16  1:32 AM  Result Value Ref Range   Sed Rate 10 0 - 16 mm/hr  Comprehensive metabolic panel     Status: Abnormal   Collection Time: 06/26/16  1:32 AM  Result Value Ref Range   Sodium 138 135 - 145 mmol/L   Potassium 3.5 3.5 - 5.1 mmol/L   Chloride 104 101 - 111 mmol/L   CO2 28 22 - 32 mmol/L   Glucose, Bld 95 65 - 99 mg/dL   BUN 14 6 - 20 mg/dL   Creatinine, Ser 1.03 0.61 - 1.24 mg/dL   Calcium 8.2 (L) 8.9 - 10.3 mg/dL   Total Protein 6.9 6.5 - 8.1 g/dL   Albumin 3.3 (L) 3.5 - 5.0 g/dL   AST 32 15 - 41 U/L   ALT 36 17 - 63 U/L   Alkaline Phosphatase 63 38 - 126 U/L   Total Bilirubin 1.0 0.3 - 1.2  mg/dL   GFR calc non Af Amer >60 >60 mL/min   GFR calc Af Amer >60 >60 mL/min    Comment: (NOTE) The eGFR has been calculated using the CKD EPI equation. This calculation has not been validated in all clinical  situations. eGFR's persistently <60 mL/min signify possible Chronic Kidney Disease.    Anion gap 6 5 - 15  CBC WITH DIFFERENTIAL     Status: Abnormal   Collection Time: 06/26/16  1:32 AM  Result Value Ref Range   WBC 18.4 (H) 4.0 - 10.5 K/uL   RBC 4.51 4.22 - 5.81 MIL/uL   Hemoglobin 12.7 (L) 13.0 - 17.0 g/dL   HCT 37.9 (L) 39.0 - 52.0 %   MCV 84.0 78.0 - 100.0 fL   MCH 28.2 26.0 - 34.0 pg   MCHC 33.5 30.0 - 36.0 g/dL   RDW 13.9 11.5 - 15.5 %   Platelets 330 150 - 400 K/uL   Neutrophils Relative % 80 %   Lymphocytes Relative 13 %   Monocytes Relative 7 %   Eosinophils Relative 0 %   Basophils Relative 0 %   Neutro Abs 14.7 (H) 1.7 - 7.7 K/uL   Lymphs Abs 2.4 0.7 - 4.0 K/uL   Monocytes Absolute 1.3 (H) 0.1 - 1.0 K/uL   Eosinophils Absolute 0.0 0.0 - 0.7 K/uL   Basophils Absolute 0.0 0.0 - 0.1 K/uL   Smear Review MORPHOLOGY UNREMARKABLE   Culture, blood (routine x 2)     Status: None (Preliminary result)   Collection Time: 06/26/16  1:42 AM  Result Value Ref Range   Specimen Description LEFT ANTECUBITAL    Special Requests BOTTLES DRAWN AEROBIC ONLY 6CC    Culture      NO GROWTH <12 HOURS Performed at [facility]    Report Status PENDING   Culture, blood (routine x 2)     Status: None (Preliminary result)   Collection Time: 06/26/16  1:42 AM  Result Value Ref Range   Specimen Description BLOOD LEFT ARM    Special Requests BOTTLES DRAWN AEROBIC ONLY 8CC    Culture      NO GROWTH <12 HOURS Performed at [facility]    Report Status PENDING     Ct Forearm Right W Contrast  Result Date: 06/26/2016 CLINICAL DATA:  Acute onset of right forearm pain, erythema and swelling. Fever. Initial encounter. EXAM: CT OF THE RIGHT FOREARM WITH CONTRAST TECHNIQUE: Multidetector CT imaging was performed following the standard protocol during bolus administration of intravenous contrast. CONTRAST:  169m ISOVUE-300 IOPAMIDOL (ISOVUE-300) INJECTION 61% COMPARISON:  Right  forearm radiographs performed 03/09/2008 FINDINGS: There is a large 9.1 x 5.8 x 2.0 cm peripherally enhancing abscess at the ulnar aspect of the mid forearm, with overlying diffuse soft tissue edema extending along much of the ulnar and dorsal forearm. This appears to arise within the musculature, and is mildly complex in appearance. There is some degree of effacement of overlying vasculature. No acute osseous abnormalities are seen. There is no evidence of osseous erosion. The carpal rows appear grossly intact, and demonstrate normal alignment. No elbow joint effusion is identified. IMPRESSION: Large 9.1 x 5.9 x 2.0 cm peripherally enhancing abscess at the ulnar aspect of the mid forearm, with overlying diffuse soft tissue edema involving much of the ulnar and dorsal forearm. The abscess arises within the musculature, and is mildly complex in appearance. Some degree of effacement of overlying musculature. Electronically Signed   By: JFrancoise SchaumannD.  On: 06/26/2016 05:00     A comprehensive review of systems was negative except for: Constitutional: positive for chills, fevers and night sweats Gastrointestinal: positive for nausea Review of Systems: No chest pain, shortness of breath, vomiting, diarrhea, constipation, easy bleeding or bruising, headaches, dizziness, vision changes, fainting.   Blood pressure (!) 159/84, pulse 94, temperature 98.4 F (36.9 C), temperature source Oral, resp. rate 16, height 5' 11.5" (1.816 m), weight 74.4 kg (164 lb), SpO2 100 %.  General appearance: alert, cooperative and appears stated age Head: Normocephalic, without obvious abnormality, atraumatic Neck: supple, symmetrical, trachea midline Extremities: Intact sensation and capillary refill all digits.  +epl/fpl/io.  No wounds. Swelling and induration at ulnar side of forearm.  No wounds.  Able to move elbow and digits.  Not tender volarly. Pulses: 2+ and symmetric Skin: Skin color, texture, turgor normal. No  rashes or lesions Neurologic: Grossly normal Incision/Wound: none  Assessment/Plan Right forearm abscess.  Recommend incision and drainage in OR.  Risks, benefits, and alternatives of surgery were discussed and the patient agrees with the plan of care.   Appolonia Ackert R 06/26/2016, 1:55 PM

## 2016-06-26 NOTE — H&P (Addendum)
History and Physical    Pedro Owens DOB: 1991-10-04 DOA: 06/25/2016  PCP: Vonna Drafts, FNP  Patient coming from: Home.  Chief Complaint: Right forearm pain.  HPI: Pedro Owens is a 25 y.o. male with history of HIV last CD4 count in August 2017 was 790, presents to the ER because of worsening pain and swelling of the right forearm. Patient states his symptoms started 4 days ago with pain and swelling which has gradually worsened. Patient is able to move his forearm at both the elbow and wrist area. On exam patient has swelling around the proximal aspect of the forearm. ER physician had done a sonogram at the bedside and as per the ER physician there was no definite abscess. Patient is being admitted for further management of cellulitis with possible developing abscess. On my exam patient still has significant pain. Has also been having some subjective feeling of fever and chills. Denies any trauma or injecting any drugs on to the arm or insect bites.   ED Course: Patient was started on empiric antibiotics.  Review of Systems: As per HPI, rest all negative.   Past Medical History:  Diagnosis Date  . ADHD (attention deficit hyperactivity disorder)   . Anal warts   . Anxiety   . Chronic pain syndrome   . Depression   . Gonorrhea 09/08/11  . HIV infection ([place])   . Ileitis   . Lymphoid hyperplasia, reactive   . Marijuana abuse   . Pseudoseizures   . Seizures ([place])     Past Surgical History:  Procedure Laterality Date  . ANKLE SURGERY Bilateral 2005   Screw & Pins  . FOOT SURGERY Bilateral 2005   Screw & Pins     reports that he has been smoking Cigarettes.  He has a 8.00 pack-year smoking history. He has never used smokeless tobacco. He reports that he drinks about 0.6 oz of alcohol per week . He reports that he uses drugs, including Cocaine, Benzodiazepines, and Oxycodone, about 2 times per week.  Allergies  Allergen Reactions  . Acetaminophen  Diarrhea    Flares up crohns disease  . Vicodin [Hydrocodone-Acetaminophen] Nausea And Vomiting  . Dilaudid [Hydromorphone Hcl] Hives and Rash    Family History  Problem Relation Age of Onset  . Diabetes Mother   . Irritable bowel syndrome Mother   . Hypertension Mother   . Hyperlipidemia Mother   . Hypothyroidism Mother   . Diabetes Maternal Uncle   . Heart disease Maternal Grandmother   . Leukemia Other     maternal greatgrandmother  . Colon cancer Neg Hx     Prior to Admission medications   Medication Sig Start Date End Date Taking? Authorizing Provider  abacavir-dolutegravir-lamiVUDine (TRIUMEQ) 600-50-300 MG tablet Take 1 tablet by mouth daily. 04/16/16  Yes Michel Bickers, MD  desipramine (NORPRAMIN) 25 MG tablet Take 1 tablet (25 mg total) by mouth at bedtime. Patient not taking: Reported on 06/25/2016 05/06/16   Manus Gunning, MD  Na Sulfate-K Sulfate-Mg Sulf [facility] BOWEL PREP KIT) 17.5-3.13-1.6 GM/180ML SOLN Use as directed Patient not taking: Reported on 06/25/2016 05/06/16   Manus Gunning, MD    Physical Exam: Vitals:   06/25/16 1704 06/25/16 2000 06/25/16 2208 06/25/16 2352  BP: 125/74 132/84 116/84 135/78  Pulse: 112 103 104 94  Resp: _0 Temp: 100 F (37.8 C) 102.8 F (39.3 C) 101 F (38.3 C) (!) 101.6 F (38.7 C)  TempSrc: Oral  Oral Oral Oral  SpO2: 98% 100% 100% 100%      Constitutional: Not in distress. Vitals:   06/25/16 1704 06/25/16 2000 06/25/16 2208 06/25/16 2352  BP: 125/74 132/84 116/84 135/78  Pulse: 112 103 104 94  Resp: _0 Temp: 100 F (37.8 C) 102.8 F (39.3 C) 101 F (38.3 C) (!) 101.6 F (38.7 C)  TempSrc: Oral Oral Oral Oral  SpO2: 98% 100% 100% 100%   Eyes: Anicteric no pallor. ENMT: No discharge from the ears eyes nose and mouth. Neck: No mass felt. No neck rigidity. Respiratory: No rhonchi or crepitations. Cardiovascular: S1 and S2 heard. Abdomen: Soft nontender bowel sounds  present. Musculoskeletal: Swelling and tenderness in the right forearm. Swelling is mostly in the proximal right forearm. Patient is able to extend and flex her right elbow and wrist. Skin: Mildly erythematous skin over the right forearm. Neurologic: Alert awake oriented to time place and person. Moves all extremities. Psychiatric: Appears normal.   Labs on Admission: I have personally reviewed following labs and imaging studies  CBC:  Recent Labs Lab 06/25/16 1820 06/25/16 2029  WBC 23.1*  --   NEUTROABS 19.0*  --   HGB 14.3 15.3  HCT 42.5 45.0  MCV 83.3  --   PLT 353  --    Basic Metabolic Panel:  Recent Labs Lab 06/25/16 1820 06/25/16 2029  NA 137 140  K 3.7 3.4*  CL 101 100*  CO2 26  --   GLUCOSE 73 74  BUN 16 16  CREATININE 1.00 0.80  CALCIUM 9.1  --    GFR: CrCl cannot be calculated (Unknown ideal weight.). Liver Function Tests:  Recent Labs Lab 06/25/16 1820  AST 36  ALT 44  ALKPHOS 65  BILITOT 1.5*  PROT 7.8  ALBUMIN 3.5   No results for input(s): LIPASE, AMYLASE in the last 168 hours. No results for input(s): AMMONIA in the last 168 hours. Coagulation Profile: No results for input(s): INR, PROTIME in the last 168 hours. Cardiac Enzymes: No results for input(s): CKTOTAL, CKMB, CKMBINDEX, TROPONINI in the last 168 hours. BNP (last 3 results) No results for input(s): PROBNP in the last 8760 hours. HbA1C: No results for input(s): HGBA1C in the last 72 hours. CBG: No results for input(s): GLUCAP in the last 168 hours. Lipid Profile: No results for input(s): CHOL, HDL, LDLCALC, TRIG, CHOLHDL, LDLDIRECT in the last 72 hours. Thyroid Function Tests: No results for input(s): TSH, T4TOTAL, FREET4, T3FREE, THYROIDAB in the last 72 hours. Anemia Panel: No results for input(s): VITAMINB12, FOLATE, FERRITIN, TIBC, IRON, RETICCTPCT in the last 72 hours. Urine analysis:    Component Value Date/Time   COLORURINE YELLOW 03/26/2016 [place] 03/26/2016 1119   LABSPEC 1.026 03/26/2016 1119   PHURINE 6.5 03/26/2016 1119   GLUCOSEU NEGATIVE 03/26/2016 1119   HGBUR NEGATIVE 03/26/2016 1119   BILIRUBINUR NEGATIVE 03/26/2016 1119   KETONESUR NEGATIVE 03/26/2016 1119   PROTEINUR NEGATIVE 03/26/2016 1119   UROBILINOGEN 1.0 08/04/2015 1935   NITRITE NEGATIVE 03/26/2016 1119   LEUKOCYTESUR NEGATIVE 03/26/2016 1119   Sepsis Labs: _1 (procalcitonin:4,lacticidven:4) )No results found for this or any previous visit (from the past 240 hour(s)).   Radiological Exams on Admission: No results found.   Assessment/Plan Principal Problem:   Right forearm cellulitis Active Problems:   HIV disease (HCC)   Cellulitis of right forearm    1. Right forearm cellulitis concerning for developing abscess - since patient has significant pain I have  discussed with on-call radiologist Dr. Radene Knee. Will be getting a stat CT of the right forearm with contrast to rule out abscess. I have placed patient on vancomycin and Zosyn. Continue pain relief medications IV fluids. 2. History of HIV - last CD4 count last month was 790. Continue antiretrovirals. 3. History of polysubstance abuse - patient states he has not had any cocaine or other drugs for last 1 month.  Addendum - CT scan shows an abscess on the ulnar aspect of the right forearm measuring 9 into 2 cm. Discussed with Dr. Fredna Dow, on-call hand surgeon who has advised transferring patient to [facility]. Patient will be kept nothing by mouth in anticipation of procedure. Patient agreeable to transfer. Dr. Alcario Drought, will be the accepting physician.   DVT prophylaxis: SCDs. Code Status: Full code.  Family Communication: Discussed with patient.  Disposition Plan: Home.  Consults called: Hand surgery.  Admission status: Inpatient.    Rise Patience MD [facility] Pager [phone].  If 7PM-7AM, please contact night-coverage [email or web address] Password  [email or web address]  06/26/2016, 1:50 AM

## 2016-06-26 NOTE — Brief Op Note (Signed)
06/25/2016 - 06/26/2016  3:09 PM  PATIENT:  Pedro Owens  25 y.o. male  PRE-OPERATIVE DIAGNOSIS:  abscess right forearm  POST-OPERATIVE DIAGNOSIS:  abscess right forearm  PROCEDURE:  Procedure(s): IRRIGATION AND DEBRIDEMENT RIGHT FOREARM (Right)  SURGEON:  Surgeon(s) and Role:    * Betha Loa, MD - Primary  PHYSICIAN ASSISTANT:   ASSISTANTS: none   ANESTHESIA:   general  EBL:  Total I/O In: 1573.3 [I.V.:1523.3; IV Piggyback:50] Out: 520 [Urine:500; Blood:20]  BLOOD ADMINISTERED:none  DRAINS: iodoform packing  LOCAL MEDICATIONS USED:  NONE  SPECIMEN:  Source of Specimen:  right forearm  DISPOSITION OF SPECIMEN:  micro  COUNTS:  YES  TOURNIQUET:   Total Tourniquet Time Documented: Upper Arm (Right) - 42 minutes Total: Upper Arm (Right) - 42 minutes   DICTATION: .Other Dictation: Dictation Number [id number]  PLAN OF CARE: return to floor  PATIENT DISPOSITION:  PACU - hemodynamically stable.   Delay start of Pharmacological VTE agent (>24hrs) due to surgical blood loss or risk of bleeding: no

## 2016-06-26 NOTE — Op Note (Signed)
[id number] 

## 2016-06-26 NOTE — Anesthesia Procedure Notes (Signed)
Procedure Name: LMA Insertion Date/Time: 06/26/2016 2:12 PM Performed by: Lucinda DellECARLO, Sherman Lipuma M Pre-anesthesia Checklist: Patient identified, Emergency Drugs available, Suction available and Patient being monitored Patient Re-evaluated:Patient Re-evaluated prior to inductionOxygen Delivery Method: Circle system utilized Preoxygenation: Pre-oxygenation with 100% oxygen Intubation Type: IV induction Ventilation: Mask ventilation without difficulty LMA: LMA inserted LMA Size: 5.0 Tube type: Oral Number of attempts: 1 Placement Confirmation: positive ETCO2 and breath sounds checked- equal and bilateral Tube secured with: Tape Dental Injury: Teeth and Oropharynx as per pre-operative assessment

## 2016-06-26 NOTE — Transfer of Care (Signed)
Immediate Anesthesia Transfer of Care Note  Patient: Pedro Owens  Procedure(s) Performed: Procedure(s): IRRIGATION AND DEBRIDEMENT RIGHT FOREARM (Right)  Patient Location: PACU  Anesthesia Type:General  Level of Consciousness: awake, alert , oriented and patient cooperative  Airway & Oxygen Therapy: Patient Spontanous Breathing and Patient connected to nasal cannula oxygen  Post-op Assessment: Report given to RN, Post -op Vital signs reviewed and stable and Patient moving all extremities  Post vital signs: Reviewed and stable  Last Vitals:  Vitals:   06/26/16 1255 06/26/16 1520  BP: (!) 159/84 (P) 133/88  Pulse: 94 (!) (P) 101  Resp:    Temp:  (P) 36.6 C    Last Pain:  Vitals:   06/26/16 1252  TempSrc:   PainSc: 10-Worst pain ever      Patients Stated Pain Goal: 5 (06/26/16 1252)  Complications: No apparent anesthesia complications

## 2016-06-27 LAB — RAPID URINE DRUG SCREEN, HOSP PERFORMED
AMPHETAMINES: NOT DETECTED
BENZODIAZEPINES: POSITIVE — AB
Barbiturates: NOT DETECTED
COCAINE: POSITIVE — AB
OPIATES: POSITIVE — AB
Tetrahydrocannabinol: NOT DETECTED

## 2016-06-27 LAB — CBC
HCT: 33.4 % — ABNORMAL LOW (ref 39.0–52.0)
Hemoglobin: 10.9 g/dL — ABNORMAL LOW (ref 13.0–17.0)
MCH: 27.5 pg (ref 26.0–34.0)
MCHC: 32.6 g/dL (ref 30.0–36.0)
MCV: 84.1 fL (ref 78.0–100.0)
PLATELETS: 330 10*3/uL (ref 150–400)
RBC: 3.97 MIL/uL — ABNORMAL LOW (ref 4.22–5.81)
RDW: 14 % (ref 11.5–15.5)
WBC: 16.9 10*3/uL — AB (ref 4.0–10.5)

## 2016-06-27 MED ORDER — OXYCODONE HCL 5 MG PO TABS
5.0000 mg | ORAL_TABLET | ORAL | Status: DC | PRN
  Administered 2016-06-27 – 2016-06-28 (×4): 5 mg via ORAL
  Filled 2016-06-27 (×4): qty 1

## 2016-06-27 NOTE — Anesthesia Postprocedure Evaluation (Signed)
Anesthesia Post Note  Patient: Pedro Owens  Procedure(s) Performed: Procedure(s) (LRB): IRRIGATION AND DEBRIDEMENT RIGHT FOREARM (Right)  Patient location during evaluation: PACU Anesthesia Type: General Level of consciousness: awake and alert Pain management: pain level controlled Vital Signs Assessment: post-procedure vital signs reviewed and stable Respiratory status: spontaneous breathing, nonlabored ventilation, respiratory function stable and patient connected to nasal cannula oxygen Cardiovascular status: blood pressure returned to baseline and stable Postop Assessment: no signs of nausea or vomiting Anesthetic complications: no    Last Vitals:  Vitals:   06/27/16 0021 06/27/16 0620  BP: (!) 122/58 (!) 106/53  Pulse: 72 70  Resp: 17 18  Temp: 36.7 C 36.7 C    Last Pain:  Vitals:   06/27/16 0620  TempSrc: Oral  PainSc:                  Cecile Hearing

## 2016-06-27 NOTE — Progress Notes (Signed)
Progress Note    Pedro Owens  ZOX:[id number] DOB: 20-Nov-1990  DOA: 06/25/2016 PCP: Diamantina Providenceakela N Anderson, FNP    Brief Narrative:   Chief complaint: Follow-up right arm cellulitis  Pedro Owens is an 25 y.o. male with a PMH of HIV, last CD4 count 798/2017, who was admitted 06/25/16 with a chief complaint of right forearm pain and swelling.  Assessment/Plan:   Principal Problem:   Right forearm cellulitis/Abscess Empirically placed on vancomycin/Zosyn. Follow-up blood cultures. CT of the forearm showed a large 9.1 X5.9X 2.0 cm abscess at the ulnar aspect of the midforearm, status post I&D 06/26/16. WBC remains elevated, but trend is down.  Active Problems:   HIV disease (HCC) Followed by Dr. Orvan Falconerampbell in the ID clinic. Continue Triumeq.    Cocaine abuse/polysubstance abuse Patient does have a history of cocaine and marijuana abuse. Urine drug screen has not been sent yet, I have reordered and requested the nurse send a sample as ordered on admission.   Family Communication/Anticipated D/C date and plan/Code Status   DVT prophylaxis: SCDs ordered. Code Status: Full Code.  Family Communication: Multiple family at the bedside. Disposition Plan: Home when medically stable postoperatively.   Medical Consultants:    Hand Surgery   Procedures:    None  Anti-Infectives:    Vancomycin 06/25/16--->  Zosyn 06/25/16--->  Subjective:   The patient's review of systems is + for 7-8/10 right forearm pain, when asked about quality he says "Its just there", and negative for nausea, vomiting, dyspnea.  Appetite is normal.  Objective:    Vitals:   06/26/16 1845 06/26/16 2048 06/27/16 0021 06/27/16 0620  BP:  114/75 (!) 122/58 (!) 106/53  Pulse:  69 72 70  Resp:  17 17 18   Temp: 98.2 F (36.8 C) 98 F (36.7 C) 98 F (36.7 C) 98 F (36.7 C)  TempSrc: Oral Oral Oral Oral  SpO2:  99% 99% 99%  Weight:      Height:        Intake/Output Summary (Last 24 hours) at 06/27/16  0836 Last data filed at 06/27/16 [id number]  Gross per 24 hour  Intake          3201.67 ml  Output             1220 ml  Net          1981.67 ml   Filed Weights   06/25/16 2352  Weight: 74.4 kg (164 lb)    Exam: Gen: Sedated. Chest: Lungs clear to auscultation bilaterally with good air movement. Heart: Regular rate, and rhythm. No murmurs, rubs, or gallops. Extremities: Right arm wrapped in an Ace wrap.   Data Reviewed:   I have personally reviewed following labs and imaging studies:  Labs: Basic Metabolic Panel:  Recent Labs Lab 06/25/16 1820 06/25/16 2029 06/26/16 0132 06/26/16 1932  NA 137 140 138  --   K 3.7 3.4* 3.5  --   CL 101 100* 104  --   CO2 26  --  28  --   GLUCOSE 73 74 95  --   BUN 16 16 14   --   CREATININE 1.00 0.80 1.03 0.92  CALCIUM 9.1  --  8.2*  --    GFR Estimated Creatinine Clearance: 130.3 mL/min (by C-G formula based on SCr of 0.92 mg/dL). Liver Function Tests:  Recent Labs Lab 06/25/16 1820 06/26/16 0132  AST 36 32  ALT 44 36  ALKPHOS 65 63  BILITOT 1.5* 1.0  PROT 7.8 6.9  ALBUMIN 3.5 3.3*    CBC:  Recent Labs Lab 06/25/16 1820 06/25/16 2029 06/26/16 0132 06/26/16 1932 06/27/16 0417  WBC 23.1*  --  18.4* 20.6* 16.9*  NEUTROABS 19.0*  --  14.7*  --   --   HGB 14.3 15.3 12.7* 11.3* 10.9*  HCT 42.5 45.0 37.9* 34.5* 33.4*  MCV 83.3  --  84.0 84.4 84.1  PLT 353  --  330 322 330   Sepsis Labs:  Recent Labs Lab 06/25/16 1820 06/25/16 2117 06/26/16 0132 06/26/16 1932 06/27/16 0417  WBC 23.1*  --  18.4* 20.6* 16.9*  LATICACIDVEN  --  1.13  --   --   --     Microbiology Recent Results (from the past 240 hour(s))  Culture, blood (routine x 2)     Status: None (Preliminary result)   Collection Time: 06/26/16  1:42 AM  Result Value Ref Range Status   Specimen Description LEFT ANTECUBITAL  Final   Special Requests BOTTLES DRAWN AEROBIC ONLY 6CC  Final   Culture   Final    NO GROWTH <12 HOURS Performed at [facility]    Report Status PENDING  Incomplete  Culture, blood (routine x 2)     Status: None (Preliminary result)   Collection Time: 06/26/16  1:42 AM  Result Value Ref Range Status   Specimen Description BLOOD LEFT ARM  Final   Special Requests BOTTLES DRAWN AEROBIC ONLY 8CC  Final   Culture   Final    NO GROWTH <12 HOURS Performed at [facility]    Report Status PENDING  Incomplete  Aerobic/Anaerobic Culture (surgical/deep wound)     Status: None (Preliminary result)   Collection Time: 06/26/16  2:34 PM  Result Value Ref Range Status   Specimen Description ABSCESS RIGHT FOREARM  Final   Special Requests NONE  Final   Gram Stain   Final    MODERATE WBC PRESENT,BOTH PMN AND MONONUCLEAR ABUNDANT GRAM POSITIVE COCCI IN PAIRS AND CHAINS MODERATE GRAM NEGATIVE RODS MODERATE GRAM POSITIVE COCCI IN CLUSTERS    Culture PENDING  Incomplete   Report Status PENDING  Incomplete    Radiology: Ct Forearm Right W Contrast  Result Date: 06/26/2016 CLINICAL DATA:  Acute onset of right forearm pain, erythema and swelling. Fever. Initial encounter. EXAM: CT OF THE RIGHT FOREARM WITH CONTRAST TECHNIQUE: Multidetector CT imaging was performed following the standard protocol during bolus administration of intravenous contrast. CONTRAST:  ISOVUE-300 IOPAMIDOL (ISOVUE-300) INJECTION 61% COMPARISON:  Right forearm radiographs performed 03/09/2008 FINDINGS: There is a large 9.1 x 5.8 x 2.0 cm peripherally enhancing abscess at the ulnar aspect of the mid forearm, with overlying diffuse soft tissue edema extending along much of the ulnar and dorsal forearm. This appears to arise within the musculature, and is mildly complex in appearance. There is some degree of effacement of overlying vasculature. No acute osseous abnormalities are seen. There is no evidence of osseous erosion. The carpal rows appear grossly intact, and demonstrate normal alignment. No elbow joint effusion is identified.  IMPRESSION: Large 9.1 x 5.9 x 2.0 cm peripherally enhancing abscess at the ulnar aspect of the mid forearm, with overlying diffuse soft tissue edema involving much of the ulnar and dorsal forearm. The abscess arises within the musculature, and is mildly complex in appearance. Some degree of effacement of overlying musculature. Electronically Signed   By: Roanna Raider M.D.   On: 06/26/2016 05:00    Medications:   .  abacavir-dolutegravir-lamiVUDine  1 tablet Oral Daily  . enoxaparin (LOVENOX) injection  40 mg Subcutaneous Q24H  . piperacillin-tazobactam (ZOSYN)  IV  3.375 g Intravenous Q8H  . vancomycin  1,500 mg Intravenous Q12H  . vitamin C  1,000 mg Oral Daily   Continuous Infusions: . lactated ringers 75 mL/hr at 06/27/16 0316    This patient requires moderate complexity decision-making and is at moderate risk for deterioration and therefore this is a level II visit.    LOS: 2 days   Alene Bergerson  [facility] Pager [phone]. If unable to reach me by pager, please call my cell phone at [phone].  *Please refer to [email or web address], password [email or web address] to get updated schedule on who will round on this patient, as hospitalists switch teams weekly. If 7PM-7AM, please contact night-coverage at [email or web address], password [email or web address] for any overnight needs.  06/27/2016, 8:36 AM

## 2016-06-27 NOTE — Op Note (Signed)
NAMCatha Gosselin:  Englert, Mang                ACCOUNT NO.:  [id number]  MEDICAL RECORD NO.:  [id number]  LOCATION:  [id number]                        FACILITY:  [facility]  PHYSICIAN:  Betha LoaKevin Kainan Patty, MD        DATE OF BIRTH:  10/19/1991  DATE OF PROCEDURE:  06/26/2016 DATE OF DISCHARGE:  06/26/2016                              OPERATIVE REPORT   PREOPERATIVE DIAGNOSIS:  Right forearm abscess.  POSTOPERATIVE DIAGNOSIS:  Right forearm abscess.  PROCEDURE:  Incision and drainage of right forearm abscess including inspection of tissues deep to fascia.  SURGEON:  Betha LoaKevin Nichoel Digiulio, MD  ASSISTANT:  None.  ANESTHESIA:  General.  IV FLUIDS:  Per anesthesia flow sheet.  ESTIMATED BLOOD LOSS:  Minimal.  COMPLICATIONS:  None.  SPECIMENS:  Cultures to Micro.  TOURNIQUET TIME:  42 minutes.  DISPOSITION:  Stable to PACU.  INDICATIONS:  Mr. Cliffton AstersWhite is a 25 year old right-hand dominant male, who states he has had issues with his right forearm for the past 4 days. This has been getting worse.  He was admitted last night.  His Campoverde count was 18.4.  He has had fevers, chills, and night sweats.  A CT was performed showing a fluid collection in the ulnar side of the forearm. I recommended incision and drainage in the operating room.  Risks, benefits, and alternative to surgery were discussed including risk of blood loss; infection; damage to nerves, vessels, tendons, ligaments, bone; failure of surgery; need for additional surgery; complications with wound healing; continued pain; continued infection; need for repeat irrigation and debridement.  He voiced understanding of these risks and elected to proceed.  OPERATIVE COURSE:  After being identified properly by myself, the patient and I agreed upon procedure and site of procedure.  Surgical site was marked.  The risks, benefits, alternatives of surgery were reviewed; and he wished to proceed.  Surgical consent had been signed.  DESCRIPTION OF PROCEDURE:  He was  transferred to the operating room and placed on the operating room table in supine position with the right upper extremity on arm board.  General anesthesia induced by Anesthesiology.  Right upper extremity was prepped and draped in normal sterile orthopedic fashion.  Surgical pause was performed between surgeons, anesthesia, operating staff; and all were in agreement with patient, procedure, and site of procedure.  Tourniquet at the proximal aspect of the extremity was inflated to 250 mmHg after exsanguination of the limb with Esmarch bandage.  Incision was made at the ulnar side of the forearm over the ulna.  This was carried into subcutaneous tissues by spreading technique.  A large amount of purulent fluid was encountered.  Cultures were taken for aerobes, anaerobes, and Gram stain.  The wound was extended both proximally and distally to allow complete visualization of the entire abscess cavity.  This was 20 cm or more in length.  The wound was debrided with the Ray-Tec sponges.  The fascia was incised.  There was no gross purulence deep to the fascia. Some muscle fibers were spread and no purulent cavity was noted.  The cavity coursed more radially at the proximal aspect.  An additional incision was made more posteriorly to allow improved access to  this cavity for future packing of the wound.  Once all purulent material had been removed, the wound was copiously irrigated with 3000 mL of sterile saline by cysto tubing.  It was then packed with 0.5-inch iodoform gauze.  It was dressed with sterile 4x4s and ABDs and wrapped with a Kerlix bandage.  A posterior splint was placed and wrapped with Kerlix and Ace bandage.  Tourniquet was deflated at 42 minutes.  Fingertips were pink with brisk capillary refill after deflation of tourniquet. Operative drapes were broken down.  The patient was awoken from anesthesia safely.  He was transferred back to stretcher and taken to PACU in stable  condition.  He was admitted to the hospitalist and will be kept for IV antibiotics, pending culture results.     Betha Loa, MD     KK/MEDQ  D:  06/26/2016  T:  06/27/2016  Job:  [id number]

## 2016-06-28 ENCOUNTER — Encounter (HOSPITAL_COMMUNITY): Payer: Self-pay | Admitting: Internal Medicine

## 2016-06-28 DIAGNOSIS — D62 Acute posthemorrhagic anemia: Secondary | ICD-10-CM | POA: Diagnosis not present

## 2016-06-28 LAB — CBC
HEMATOCRIT: 33 % — AB (ref 39.0–52.0)
HEMOGLOBIN: 10.7 g/dL — AB (ref 13.0–17.0)
MCH: 27.6 pg (ref 26.0–34.0)
MCHC: 32.4 g/dL (ref 30.0–36.0)
MCV: 85.3 fL (ref 78.0–100.0)
Platelets: 381 10*3/uL (ref 150–400)
RBC: 3.87 MIL/uL — AB (ref 4.22–5.81)
RDW: 13.9 % (ref 11.5–15.5)
WBC: 10.8 10*3/uL — AB (ref 4.0–10.5)

## 2016-06-28 MED ORDER — OXYCODONE HCL 5 MG PO TABS
10.0000 mg | ORAL_TABLET | ORAL | Status: DC | PRN
  Administered 2016-06-28 – 2016-07-03 (×13): 10 mg via ORAL
  Filled 2016-06-28 (×14): qty 2

## 2016-06-28 MED ORDER — KETOROLAC TROMETHAMINE 30 MG/ML IJ SOLN
30.0000 mg | Freq: Four times a day (QID) | INTRAMUSCULAR | Status: DC
Start: 2016-06-28 — End: 2016-06-29
  Administered 2016-06-28 – 2016-06-29 (×3): 30 mg via INTRAVENOUS
  Filled 2016-06-28 (×3): qty 1

## 2016-06-28 MED ORDER — SENNOSIDES-DOCUSATE SODIUM 8.6-50 MG PO TABS
1.0000 | ORAL_TABLET | Freq: Every day | ORAL | Status: DC
  Administered 2016-06-28 – 2016-07-02 (×5): 1 via ORAL
  Filled 2016-06-28 (×5): qty 1

## 2016-06-28 NOTE — Progress Notes (Signed)
Progress Note    Pedro GammaXavier L Hardiman  ZOX:[id number]:[id number] DOB: 10-19-1991  DOA: 06/25/2016 PCP: Diamantina Providenceakela N Anderson, FNP    Brief Narrative:   Chief complaint: Follow-up right arm cellulitis  Pedro Owens is an 25 y.o. male with a PMH of HIV, last CD4 count 798/2017, who was admitted 06/25/16 with a chief complaint of right forearm pain and swelling.  Assessment/Plan:   Principal Problem:   Right forearm cellulitis/Abscess/right forearm pain Empirically placed on vancomycin/Zosyn. Follow-up blood cultures, and operative wound cultures (reintubated for better growth). CT of the forearm showed a large 9.1 X5.9X 2.0 cm abscess at the ulnar aspect of the midforearm, status post I&D 06/26/16. WBC much improved.Continues to report inadequate pain relief. Will put on empiric Toradol ATC and increase oxycodone to 10 mg every 4 hours for breakthrough pain. Would avoid escalating IV morphine and use only for severe breakthrough pain.  Active Problems:   Postoperative anemia due to acute blood loss 4 g drop in hemoglobin over admission values. Continue to monitor hemoglobin. No current indication for transfusion.    Constipation Opiate induced. Start Senokot daily at bedtime.    HIV disease (HCC) Followed by Dr. Orvan Falconerampbell in the ID clinic. Continue Triumeq.    Cocaine abuse/polysubstance abuse Patient does have a history of cocaine and marijuana abuse. Urine drug screen confirms ongoing cocaine abuse. Social worker consultation for substance abuse counseling.   Family Communication/Anticipated D/C date and plan/Code Status   DVT prophylaxis: SCDs ordered. Code Status: Full Code.  Family Communication: No family at the bedside. Disposition Plan: Home when medically stable postoperatively.   Medical Consultants:    Hand Surgery   Procedures:    Incision and drainage of right forearm abscess including inspection of tissues deep to fascia on 06/26/16 by Dr. Merlyn LotKuzma.  Anti-Infectives:     Vancomycin 06/25/16--->  Zosyn 06/25/16--->  Subjective:   The patient's review of systems is + for 9/10 right forearm pain, describes pain as feeling like "something is stuck" in his forearm and negative for nausea, vomiting, dyspnea.  Appetite is normal. He has not moved his bowels since admission.  Objective:    Vitals:   06/27/16 0620 06/27/16 1300 06/27/16 1952 06/28/16 0548  BP: (!) 106/53 123/69 115/66 118/71  Pulse: 70 69 75 66  Resp: 18   18  Temp: 98 F (36.7 C) 98.6 F (37 C) 98.4 F (36.9 C) 98.8 F (37.1 C)  TempSrc: Oral Oral Oral Oral  SpO2: 99% 100% 100% 100%  Weight:      Height:        Intake/Output Summary (Last 24 hours) at 06/28/16 0852 Last data filed at 06/28/16 0358  Gross per 24 hour  Intake                0 ml  Output             1300 ml  Net            -1300 ml   Filed Weights   06/25/16 2352  Weight: 74.4 kg (164 lb)    Exam: Gen: Sedated. Chest: Lungs clear to auscultation bilaterally with good air movement. Heart: Regular rate, and rhythm. No murmurs, rubs, or gallops. Extremities: Right arm wrapped in an Ace wrap.   Data Reviewed:   I have personally reviewed following labs and imaging studies:  Labs: Basic Metabolic Panel:  Recent Labs Lab 06/25/16 1820 06/25/16 2029 06/26/16 0132 06/26/16 1932  NA 137 140  138  --   K 3.7 3.4* 3.5  --   CL 101 100* 104  --   CO2 26  --  28  --   GLUCOSE 73 74 95  --   BUN 16 16 14   --   CREATININE 1.00 0.80 1.03 0.92  CALCIUM 9.1  --  8.2*  --    GFR Estimated Creatinine Clearance: 130.3 mL/min (by C-G formula based on SCr of 0.92 mg/dL). Liver Function Tests:  Recent Labs Lab 06/25/16 1820 06/26/16 0132  AST 36 32  ALT 44 36  ALKPHOS 65 63  BILITOT 1.5* 1.0  PROT 7.8 6.9  ALBUMIN 3.5 3.3*    CBC:  Recent Labs Lab 06/25/16 1820 06/25/16 2029 06/26/16 0132 06/26/16 1932 06/27/16 0417 06/28/16 0349  WBC 23.1*  --  18.4* 20.6* 16.9* 10.8*  NEUTROABS 19.0*  --   14.7*  --   --   --   HGB 14.3 15.3 12.7* 11.3* 10.9* 10.7*  HCT 42.5 45.0 37.9* 34.5* 33.4* 33.0*  MCV 83.3  --  84.0 84.4 84.1 85.3  PLT 353  --  330 322 330 381   Sepsis Labs:  Recent Labs Lab 06/25/16 2117 06/26/16 0132 06/26/16 1932 06/27/16 0417 06/28/16 0349  WBC  --  18.4* 20.6* 16.9* 10.8*  LATICACIDVEN 1.13  --   --   --   --     Microbiology Recent Results (from the past 240 hour(s))  Culture, blood (routine x 2)     Status: None (Preliminary result)   Collection Time: 06/26/16  1:42 AM  Result Value Ref Range Status   Specimen Description LEFT ANTECUBITAL  Final   Special Requests BOTTLES DRAWN AEROBIC ONLY 6CC  Final   Culture   Final    NO GROWTH 1 DAY Performed at [facility]    Report Status PENDING  Incomplete  Culture, blood (routine x 2)     Status: None (Preliminary result)   Collection Time: 06/26/16  1:42 AM  Result Value Ref Range Status   Specimen Description BLOOD LEFT ARM  Final   Special Requests BOTTLES DRAWN AEROBIC ONLY 8CC  Final   Culture   Final    NO GROWTH 1 DAY Performed at [facility]    Report Status PENDING  Incomplete  Aerobic/Anaerobic Culture (surgical/deep wound)     Status: None (Preliminary result)   Collection Time: 06/26/16  2:34 PM  Result Value Ref Range Status   Specimen Description ABSCESS RIGHT FOREARM  Final   Special Requests NONE  Final   Gram Stain   Final    MODERATE WBC PRESENT,BOTH PMN AND MONONUCLEAR ABUNDANT GRAM POSITIVE COCCI IN PAIRS AND CHAINS MODERATE GRAM NEGATIVE RODS MODERATE GRAM POSITIVE COCCI IN CLUSTERS    Culture CULTURE REINCUBATED FOR BETTER GROWTH  Final   Report Status PENDING  Incomplete    Radiology: No results found.  Medications:   . abacavir-dolutegravir-lamiVUDine  1 tablet Oral Daily  . enoxaparin (LOVENOX) injection  40 mg Subcutaneous Q24H  . piperacillin-tazobactam (ZOSYN)  IV  3.375 g Intravenous Q8H  . vancomycin  1,500 mg Intravenous Q12H  .  vitamin C  1,000 mg Oral Daily   Continuous Infusions: . lactated ringers 75 mL/hr at 06/27/16 0316    This Is a high complexity visit (level III):  4 problem point: 2 chronic problems, 2 new problems, 1 stable established problem = 4 problem points  2 data points (review labs, order labs)  High  risk: Ongoing use of parenteral controlled substances, ongoing use of vancomycin requiring intensive monitoring for toxicity.    LOS: 3 days   Angeleen Horney  [facility] Pager [phone]. If unable to reach me by pager, please call my cell phone at [phone].  *Please refer to [email or web address], password [email or web address] to get updated schedule on who will round on this patient, as hospitalists switch teams weekly. If 7PM-7AM, please contact night-coverage at [email or web address], password [email or web address] for any overnight needs.  06/28/2016, 8:52 AM

## 2016-06-29 ENCOUNTER — Encounter (HOSPITAL_COMMUNITY): Payer: Self-pay | Admitting: Orthopedic Surgery

## 2016-06-29 DIAGNOSIS — N179 Acute kidney failure, unspecified: Secondary | ICD-10-CM | POA: Diagnosis not present

## 2016-06-29 LAB — CREATININE, SERUM
Creatinine, Ser: 1.31 mg/dL — ABNORMAL HIGH (ref 0.61–1.24)
GFR calc Af Amer: >60 mL/min (ref >60)
GFR calc non Af Amer: >60 mL/min (ref >60)

## 2016-06-29 LAB — CBC
HCT: 35.5 % — ABNORMAL LOW (ref 39.0–52.0)
Hemoglobin: 11.4 g/dL — ABNORMAL LOW (ref 13.0–17.0)
MCH: 27.4 pg (ref 26.0–34.0)
MCHC: 32.1 g/dL (ref 30.0–36.0)
MCV: 85.3 fL (ref 78.0–100.0)
PLATELETS: 440 10*3/uL — AB (ref 150–400)
RBC: 4.16 MIL/uL — ABNORMAL LOW (ref 4.22–5.81)
RDW: 14 % (ref 11.5–15.5)
WBC: 8.4 10*3/uL (ref 4.0–10.5)

## 2016-06-29 LAB — VANCOMYCIN, TROUGH: VANCOMYCIN TR: 23 ug/mL — AB (ref 15–20)

## 2016-06-29 MED ORDER — OXYCODONE HCL 5 MG PO TABS
5.0000 mg | ORAL_TABLET | Freq: Once | ORAL | Status: AC
  Administered 2016-06-29: 5 mg via ORAL
  Filled 2016-06-29: qty 1

## 2016-06-29 MED ORDER — MORPHINE SULFATE (PF) 4 MG/ML IV SOLN: 5.0000 mg | INTRAVENOUS | Status: DC | PRN

## 2016-06-29 MED ORDER — ONDANSETRON HCL 4 MG PO TABS
4.0000 mg | ORAL_TABLET | ORAL | Status: DC | PRN
  Administered 2016-06-29 – 2016-07-03 (×8): 4 mg via ORAL
  Filled 2016-06-29 (×9): qty 1

## 2016-06-29 MED ORDER — IBUPROFEN 200 MG PO TABS
600.0000 mg | ORAL_TABLET | Freq: Three times a day (TID) | ORAL | Status: DC
  Administered 2016-06-29 (×2): 600 mg via ORAL
  Filled 2016-06-29 (×3): qty 3

## 2016-06-29 MED ORDER — AMOXICILLIN 500 MG PO CAPS
500.0000 mg | ORAL_CAPSULE | Freq: Three times a day (TID) | ORAL | Status: DC
  Administered 2016-06-29 – 2016-07-02 (×12): 500 mg via ORAL
  Filled 2016-06-29 (×11): qty 1

## 2016-06-29 MED ORDER — VANCOMYCIN HCL IN DEXTROSE 1-5 GM/200ML-% IV SOLN
1000.0000 mg | Freq: Two times a day (BID) | INTRAVENOUS | Status: DC
  Filled 2016-06-29: qty 200

## 2016-06-29 MED ORDER — MORPHINE SULFATE (PF) 2 MG/ML IV SOLN
2.0000 mg | INTRAVENOUS | Status: DC | PRN
  Administered 2016-06-29: 2 mg via INTRAVENOUS
  Administered 2016-06-30 (×2): 4 mg via INTRAVENOUS
  Administered 2016-06-30 – 2016-07-01 (×3): 2 mg via INTRAVENOUS
  Administered 2016-07-01: 4 mg via INTRAVENOUS
  Administered 2016-07-01 – 2016-07-02 (×3): 2 mg via INTRAVENOUS
  Administered 2016-07-03: 4 mg via INTRAVENOUS
  Filled 2016-06-29 (×2): qty 1
  Filled 2016-06-29 (×2): qty 2
  Filled 2016-06-29 (×4): qty 1
  Filled 2016-06-29: qty 2
  Filled 2016-06-29: qty 1
  Filled 2016-06-29: qty 2

## 2016-06-29 NOTE — Care Management Note (Signed)
Case Management Note  Patient Details  Name: Pedro Owens MRN: [id number] Date of Birth: 02-Mar-1991  Subjective/Objective:   Pt admitted with infected forarm abscess                 Action/Plan:  Pt is from home.  Currently ordered hydrotherapy.  CM informed that [facility] will not provide service.  CM informed attending and requested possible out pt wound clinic consult.  CM will continue to follow for discharge needs   Expected Discharge Date:                  Expected Discharge Plan:  Home w Home Health Services  In-House Referral:     Discharge planning Services  CM Consult  Post Acute Care Choice:    Choice offered to:     DME Arranged:    DME Agency:     HH Arranged:    HH Agency:     Status of Service:  In process, will continue to follow  If discussed at Long Length of Stay Meetings, dates discussed:    Additional Comments:  Cherylann Parr, RN 06/29/2016, 11:18 AM

## 2016-06-29 NOTE — Progress Notes (Signed)
Progress Note    Pedro Owens  KWI:[id number] DOB: March 30, 1991  DOA: 06/25/2016 PCP: Diamantina Providence, FNP    Brief Narrative:   Chief complaint: Follow-up right arm cellulitis  Pedro Owens is an 25 y.o. male with a PMH of HIV, last CD4 count 798/2017, who was admitted 06/25/16 with a chief complaint of right forearm pain and swelling.  Assessment/Plan:   Principal Problem:   Right forearm cellulitis/Abscess/right forearm pain Empirically placed on vancomycin/Zosyn. Follow-up final blood cultures, and operative wound cultures (reintubated for better growth). CT of the forearm showed a large 9.1 X5.9X 2.0 cm abscess at the ulnar aspect of the midforearm, status post I&D 06/26/16. WBC much improved.Continues to report inadequate pain relief. IV infiltrated. Will change antibiotics to amoxicillin Since IV access lost. Wound cultures growing group C strep. Needs to reestablish IV access given painful hydrotherapy sessions for wound care. Inadequate pain control with oral medications.  Active Problems:   Acute kidney injury May be related to NSAIDs. May be related to vancomycin, which was discontinued today. Check creatinine in the morning.    Postoperative anemia due to acute blood loss 4 g drop in hemoglobin over admission values, but hemoglobin now beginning to come up.    Constipation Opiate induced. Start Senokot daily at bedtime.    HIV disease (HCC) Followed by Dr. Orvan Falconer in the ID clinic. Continue Triumeq.    Cocaine abuse/polysubstance abuse Patient does have a history of cocaine and marijuana abuse. Urine drug screen confirms ongoing cocaine abuse. Social worker consultation for substance abuse counseling.   Family Communication/Anticipated D/C date and plan/Code Status   DVT prophylaxis: SCDs ordered. Code Status: Full Code.  Family Communication: No family at the bedside. Disposition Plan: Home when medically stable postoperatively.   Medical Consultants:     Hand Surgery   Procedures:    Incision and drainage of right forearm abscess including inspection of tissues deep to fascia on 06/26/16 by Dr. Merlyn Lot.  Anti-Infectives:    Vancomycin 06/25/16--->06/29/16  Zosyn 06/25/16---> 06/29/16  Amoxicillin 06/29/16--->  Subjective:   The patient's review of systems is + for 10/10 right forearm pain, severe with hydrotherapy session, some nausea and negative for vomiting, dyspnea.  Appetite is normal.   Objective:    Vitals:   06/28/16 0548 06/28/16 1500 06/28/16 2020 06/29/16 0453  BP: 118/71 (!) 118/59 (!) 111/59 114/60  Pulse: 66 61 60 66  Resp: 18  16 16   Temp: 98.8 F (37.1 C) 98.6 F (37 C) 97.9 F (36.6 C) 98 F (36.7 C)  TempSrc: Oral Oral Oral Oral  SpO2: 100% 100% 100% 100%  Weight:      Height:        Intake/Output Summary (Last 24 hours) at 06/29/16 0858 Last data filed at 06/29/16 2426  Gross per 24 hour  Intake                0 ml  Output             1475 ml  Net            -1475 ml   Filed Weights   06/25/16 2352  Weight: 74.4 kg (164 lb)    Exam: Gen: Awake and alert. Chest: Lungs clear to auscultation bilaterally with good air movement. Heart: Regular rate, and rhythm. No murmurs, rubs, or gallops. Extremities: Right arm wrapped in an Ace wrap. Left arm tender at prior IV site with some localized swelling.  Data Reviewed:   I have personally reviewed following labs and imaging studies:  Labs: Basic Metabolic Panel:  Recent Labs Lab 06/25/16 1820 06/25/16 2029 06/26/16 0132 06/26/16 1932 06/29/16 0527  NA 137 140 138  --   --   K 3.7 3.4* 3.5  --   --   CL 101 100* 104  --   --   CO2 26  --  28  --   --   GLUCOSE 73 74 95  --   --   BUN 16 16 14   --   --   CREATININE 1.00 0.80 1.03 0.92 1.31*  CALCIUM 9.1  --  8.2*  --   --    GFR Estimated Creatinine Clearance: 91.5 mL/min (by C-G formula based on SCr of 1.31 mg/dL). Liver Function Tests:  Recent Labs Lab 06/25/16 1820  06/26/16 0132  AST 36 32  ALT 44 36  ALKPHOS 65 63  BILITOT 1.5* 1.0  PROT 7.8 6.9  ALBUMIN 3.5 3.3*    CBC:  Recent Labs Lab 06/25/16 1820  06/26/16 0132 06/26/16 1932 06/27/16 0417 06/28/16 0349 06/29/16 0527  WBC 23.1*  --  18.4* 20.6* 16.9* 10.8* 8.4  NEUTROABS 19.0*  --  14.7*  --   --   --   --   HGB 14.3  < > 12.7* 11.3* 10.9* 10.7* 11.4*  HCT 42.5  < > 37.9* 34.5* 33.4* 33.0* 35.5*  MCV 83.3  --  84.0 84.4 84.1 85.3 85.3  PLT 353  --  330 322 330 381 440*  < > = values in this interval not displayed. Sepsis Labs:  Recent Labs Lab 06/25/16 2117  06/26/16 1932 06/27/16 0417 06/28/16 0349 06/29/16 0527  WBC  --   < > 20.6* 16.9* 10.8* 8.4  LATICACIDVEN 1.13  --   --   --   --   --   < > = values in this interval not displayed.  Microbiology Recent Results (from the past 240 hour(s))  Culture, blood (routine x 2)     Status: None (Preliminary result)   Collection Time: 06/26/16  1:42 AM  Result Value Ref Range Status   Specimen Description LEFT ANTECUBITAL  Final   Special Requests BOTTLES DRAWN AEROBIC ONLY 6CC  Final   Culture   Final    NO GROWTH 2 DAYS Performed at [facility]    Report Status PENDING  Incomplete  Culture, blood (routine x 2)     Status: None (Preliminary result)   Collection Time: 06/26/16  1:42 AM  Result Value Ref Range Status   Specimen Description BLOOD LEFT ARM  Final   Special Requests BOTTLES DRAWN AEROBIC ONLY 8CC  Final   Culture   Final    NO GROWTH 2 DAYS Performed at [facility]    Report Status PENDING  Incomplete  Aerobic/Anaerobic Culture (surgical/deep wound)     Status: None (Preliminary result)   Collection Time: 06/26/16  2:34 PM  Result Value Ref Range Status   Specimen Description ABSCESS RIGHT FOREARM  Final   Special Requests NONE  Final   Gram Stain   Final    MODERATE WBC PRESENT,BOTH PMN AND MONONUCLEAR ABUNDANT GRAM POSITIVE COCCI IN PAIRS AND CHAINS MODERATE GRAM NEGATIVE  RODS MODERATE GRAM POSITIVE COCCI IN CLUSTERS    Culture   Final    MODERATE STREPTOCOCCUS GROUP C HOLDING FOR POSSIBLE ANAEROBE    Report Status PENDING  Incomplete    Radiology: No  results found.  Medications:   . abacavir-dolutegravir-lamiVUDine  1 tablet Oral Daily  . enoxaparin (LOVENOX) injection  40 mg Subcutaneous Q24H  . ketorolac  30 mg Intravenous Q6H  . piperacillin-tazobactam (ZOSYN)  IV  3.375 g Intravenous Q8H  . senna-docusate  1 tablet Oral QHS  . vancomycin  1,000 mg Intravenous Q12H  . vitamin C  1,000 mg Oral Daily   Continuous Infusions: . lactated ringers 75 mL/hr at 06/27/16 0316   This is of moderate complexity visit.   LOS: 4 days   Duke Weisensel  [facility] Pager [phone]. If unable to reach me by pager, please call my cell phone at [phone].  *Please refer to [email or web address], password [email or web address] to get updated schedule on who will round on this patient, as hospitalists switch teams weekly. If 7PM-7AM, please contact night-coverage at [email or web address], password [email or web address] for any overnight needs.  06/29/2016, 8:58 AM

## 2016-06-29 NOTE — Progress Notes (Signed)
Paged Dr Darnelle Catalan about patient needing more pain medication after his hydrotherapy session, waiting return call

## 2016-06-29 NOTE — Progress Notes (Addendum)
Pharmacy Antibiotic Note  Pedro Owens is a 25 y.o. male admitted on 06/25/2016 with cellulitis.  Pharmacy has been consulted for zosyn/vancomycin dosing.  Day #4 of abx for R forearm cellulitis/abscess. S/p I&D on 9/8. On Triumeq for HIV. Afebrile, WBC down to wnl. Cx so far showing Group C strep. VT this am was drawn at right time and was elevated at 23 but dose last night was given 2 and a half hours late. Goal trough is 10-35mcg/mL. SCr up to 1.31, CrCl ~37ml/min.  Plan: Continue Zosyn 3.375 gm IV q8h (4 hour infusion) Restart vancomycin 1g IV Q12 at 1200 today Monitor clinical picture, renal function, VT prn F/U C&S, abx deescalation / LOT  Consider stopping vancomycin? May be able to de-escalate Zosyn soon if no anaerobes in cx  Height: 5' 11.5" (181.6 cm) Weight: 164 lb (74.4 kg) IBW/kg (Calculated) : 76.45  Temp (24hrs), Avg:98.2 F (36.8 C), Min:97.9 F (36.6 C), Max:98.6 F (37 C)   Recent Labs Lab 06/25/16 1820 06/25/16 2029 06/25/16 2117 06/26/16 0132 06/26/16 1932 06/27/16 0417 06/28/16 0349 06/29/16 0527  WBC 23.1*  --   --  18.4* 20.6* 16.9* 10.8* 8.4  CREATININE 1.00 0.80  --  1.03 0.92  --   --  1.31*  LATICACIDVEN  --   --  1.13  --   --   --   --   --   VANCOTROUGH  --   --   --   --   --   --   --  23*    Estimated Creatinine Clearance: 91.5 mL/min (by C-G formula based on SCr of 1.31 mg/dL).    Allergies  Allergen Reactions  . Acetaminophen Diarrhea    Flares up crohns disease  . Vicodin [Hydrocodone-Acetaminophen] Nausea And Vomiting  . Dilaudid [Hydromorphone Hcl] Hives and Rash    Antimicrobials this admission: 9/8 zosyn >>  9/8  vacomycin >>   Dose adjustments this admission: 9/11 VT = 23 (last dose was given 2 1/2 hrs late)  Microbiology results: BCx: ngtd Wound cx: moderate Goup C strep (holding for anaerobes-pending)  Thank you for allowing pharmacy to be a part of this patient's care.  Enzo Bi, PharmD, BCPS Clinical  Pharmacist Pager [phone] 06/29/2016 7:59 AM

## 2016-06-29 NOTE — Progress Notes (Signed)
CRITICAL VALUE ALERT  Critical value received:  Vanc Trough 23  Date of notification: 09/11  Time of notification:  07:31  Critical value read back: YES  Nurse who received alert: Y.Amit Meloy  MD notified (1st page):  [facility]  Time of first page:  07:36

## 2016-06-29 NOTE — Progress Notes (Signed)
Called to restart a peripheral IV however he is limited to  Left arm only due to cellulitis in right forearm.   Left arm swollen from several infiltrated IVs.   Talked with primary RN, Tresa Endo regarding a PICC order for this pt.  Pt is agreeable to having a PICC.  Awaiting MD response.

## 2016-06-29 NOTE — Progress Notes (Signed)
Physical Therapy Wound Treatment Patient Details  Name: Pedro Owens MRN: [id number] Date of Birth: [birth year]  Today's Date: 06/29/2016 Time: 5320-2334 Time Calculation (min): 43 min  Subjective  Subjective: Pt sobbing with pain with dressing change Patient and Family Stated Goals: not stated Date of Onset: 06/23/16 Prior Treatments: surgical I&D  Pain Score: Pain Score: 10-Worst pain ever. Pt premedicated with oral meds and no IV meds ordered.  Wound Assessment  Wound / Incision (Open or Dehisced) 06/29/16 Incision - Open Arm Right;Posterior s/p I&D (Active)  Dressing Type ABD;Compression wrap;Gauze (Comment);Moist to dry 06/29/2016 10:21 AM  Dressing Changed Changed 06/29/2016 10:21 AM  Dressing Status Clean;Dry;Intact 06/29/2016 10:21 AM  Dressing Change Frequency Daily 06/29/2016 10:21 AM  Site / Wound Assessment Bleeding;Red;Granulation tissue 06/29/2016 10:21 AM  % Wound base Red or Granulating 100% 06/29/2016 10:21 AM  % Wound base Yellow 0% 06/29/2016 10:21 AM  % Wound base Black 0% 06/29/2016 10:21 AM  % Wound base Other (Comment) 0% 06/29/2016 10:21 AM  Peri-wound Assessment Edema 06/29/2016 10:21 AM  Wound Length (cm) 11.5 cm 06/29/2016 10:21 AM  Wound Width (cm) 3.5 cm 06/29/2016 10:21 AM  Wound Depth (cm) 0.5 cm 06/29/2016 10:21 AM  Undermining (cm) Grossly 2-3 cm throughout with 9-12 oclock extending to other wound. 06/29/2016 10:21 AM  Margins Unattached edges (unapproximated) 06/29/2016 10:21 AM  Closure None 06/29/2016 10:21 AM  Drainage Amount Copious 06/29/2016 10:21 AM  Drainage Description Serosanguineous 06/29/2016 10:21 AM  Treatment Hydrotherapy (Pulse lavage);Packing (Impregnated strip) 06/29/2016 10:21 AM     Wound / Incision (Open or Dehisced) 06/29/16 Incision - Open Arm Right;Lateral s/p I&D (Active)  Dressing Type Compression wrap;ABD;Gauze (Comment);Moist to dry 06/29/2016 10:21 AM  Dressing Changed Changed 06/29/2016 10:21 AM  Dressing Status Clean;Dry;Intact  06/29/2016 10:21 AM  Dressing Change Frequency Daily 06/29/2016 10:21 AM  Site / Wound Assessment Granulation tissue;Red 06/29/2016 10:21 AM  % Wound base Red or Granulating 85% 06/29/2016 10:21 AM  % Wound base Yellow 0% 06/29/2016 10:21 AM  % Wound base Black 0% 06/29/2016 10:21 AM  % Wound base Other (Comment) 15% 06/29/2016 10:21 AM  Peri-wound Assessment Edema 06/29/2016 10:21 AM  Wound Length (cm) 4 cm 06/29/2016 10:21 AM  Wound Width (cm) 1.5 cm 06/29/2016 10:21 AM  Wound Depth (cm) 1 cm 06/29/2016 10:21 AM  Undermining (cm) 1-2 cm 6-2 oclock. >2 cm from 2-6 with likely extension to other wound. 06/29/2016 10:21 AM  Margins Unattached edges (unapproximated) 06/29/2016 10:21 AM  Closure None 06/29/2016 10:21 AM  Drainage Amount Copious 06/29/2016 10:21 AM  Drainage Description Serosanguineous 06/29/2016 10:21 AM  Treatment Hydrotherapy (Pulse lavage);Packing (Impregnated strip) 06/29/2016 10:21 AM   Hydrotherapy Pulsed lavage therapy - wound location: rt forearm Pulsed Lavage with Suction (psi): 4 psi Pulsed Lavage with Suction - Normal Saline Used: Other (comment) (300) Pulsed Lavage Tip: Tip with splash shield   Wound Assessment and Plan  Wound Therapy - Assess/Plan/Recommendations Wound Therapy - Clinical Statement: Pt presents with 2 open wounds to rt forearm after surgical I&D on 06/26/16.  Hydrotherapy to cleanse wounds and to decr bioburden. Wound very painful and unable to tolerate much volume during PLS. Wound Therapy - Functional Problem List: Decr use of RUE due to wound Factors Delaying/Impairing Wound Healing: Multiple medical problems;Polypharmacy;Substance abuse;Infection - systemic/local Hydrotherapy Plan: Dressing change;Patient/family education;Pulsatile lavage with suction Wound Therapy - Frequency: 6X / week Wound Therapy - Follow Up Recommendations: Other (comment) (Per MD) Wound Plan: See above  Wound Therapy Goals- Improve the  function of patient's integumentary system by  progressing the wound(s) through the phases of wound healing (inflammation - proliferation - remodeling) by: Decrease Length/Width/Depth by (cm): .5 (undermining) Decrease Length/Width/Depth - Progress: Goal set today Improve Drainage Characteristics: Mod Improve Drainage Characteristics - Progress: Goal set today  Goals will be updated until maximal potential achieved or discharge criteria met.  Discharge criteria: when goals achieved, discharge from hospital, MD decision/surgical intervention, no progress towards goals, refusal/missing three consecutive treatments without notification or medical reason.  GP     , 06/29/2016, 12:13 PM [place]

## 2016-06-30 ENCOUNTER — Telehealth: Payer: Self-pay | Admitting: Gastroenterology

## 2016-06-30 DIAGNOSIS — A491 Streptococcal infection, unspecified site: Secondary | ICD-10-CM | POA: Diagnosis present

## 2016-06-30 DIAGNOSIS — A498 Other bacterial infections of unspecified site: Secondary | ICD-10-CM | POA: Diagnosis present

## 2016-06-30 DIAGNOSIS — A4902 Methicillin resistant Staphylococcus aureus infection, unspecified site: Secondary | ICD-10-CM | POA: Diagnosis present

## 2016-06-30 DIAGNOSIS — M549 Dorsalgia, unspecified: Secondary | ICD-10-CM | POA: Diagnosis not present

## 2016-06-30 LAB — BASIC METABOLIC PANEL
Anion gap: 8 (ref 5–15)
BUN: 11 mg/dL (ref 6–20)
CHLORIDE: 107 mmol/L (ref 101–111)
CO2: 28 mmol/L (ref 22–32)
Calcium: 8.1 mg/dL — ABNORMAL LOW (ref 8.9–10.3)
Creatinine, Ser: 2.14 mg/dL — ABNORMAL HIGH (ref 0.61–1.24)
GFR calc Af Amer: 48 mL/min — ABNORMAL LOW (ref >60)
GFR calc non Af Amer: 41 mL/min — ABNORMAL LOW (ref >60)
GLUCOSE: 96 mg/dL (ref 65–99)
POTASSIUM: 3.7 mmol/L (ref 3.5–5.1)
Sodium: 143 mmol/L (ref 135–145)

## 2016-06-30 LAB — URINALYSIS, ROUTINE W REFLEX MICROSCOPIC
Bilirubin Urine: NEGATIVE
GLUCOSE, UA: NEGATIVE mg/dL
Ketones, ur: NEGATIVE mg/dL
Nitrite: NEGATIVE
PH: 6 (ref 5.0–8.0)
Protein, ur: NEGATIVE mg/dL

## 2016-06-30 LAB — URINE MICROSCOPIC-ADD ON

## 2016-06-30 LAB — GLUCOSE, CAPILLARY: Glucose-Capillary: 123 mg/dL — ABNORMAL HIGH (ref 65–99)

## 2016-06-30 MED ORDER — POLYETHYLENE GLYCOL 3350 17 G PO PACK
17.0000 g | PACK | Freq: Every day | ORAL | Status: DC
  Administered 2016-06-30 – 2016-07-03 (×4): 17 g via ORAL
  Filled 2016-06-30 (×4): qty 1

## 2016-06-30 NOTE — Progress Notes (Signed)
Physical Therapy Wound Treatment Patient Details  Name: Pedro Owens MRN: [id number] Date of Birth: [birth year]  Today's Date: 06/30/2016 Time: 0350-0938 Time Calculation (min): 28 min  Subjective  Subjective: Pt verbalizing pain. Patient and Family Stated Goals: not stated Date of Onset: 06/23/16 Prior Treatments: surgical I&D  Pain Score:    Wound Assessment  Wound / Incision (Open or Dehisced) 06/29/16 Incision - Open Arm Right;Posterior s/p I&D (Active)  Dressing Type ABD;Compression wrap;Gauze (Comment);Moist to dry 06/30/2016 10:56 AM  Dressing Changed Changed 06/30/2016 10:56 AM  Dressing Status Clean;Dry;Intact 06/30/2016 10:56 AM  Dressing Change Frequency Daily 06/30/2016 10:56 AM  Site / Wound Assessment Bleeding;Red;Granulation tissue 06/30/2016 10:56 AM  % Wound base Red or Granulating 100% 06/30/2016 10:56 AM  % Wound base Yellow 0% 06/30/2016 10:56 AM  % Wound base Black 0% 06/30/2016 10:56 AM  % Wound base Other (Comment) 0% 06/30/2016 10:56 AM  Peri-wound Assessment Edema 06/30/2016 10:56 AM  Wound Length (cm) 11.5 cm 06/29/2016 10:21 AM  Wound Width (cm) 3.5 cm 06/29/2016 10:21 AM  Wound Depth (cm) 0.5 cm 06/29/2016 10:21 AM  Undermining (cm) Grossly 2-3 cm throughout with 9-12 oclock extending to other wound. 06/29/2016 10:21 AM  Margins Unattached edges (unapproximated) 06/30/2016 10:56 AM  Closure None 06/30/2016 10:56 AM  Drainage Amount Moderate 06/30/2016 10:56 AM  Drainage Description Serosanguineous 06/30/2016 10:56 AM  Treatment Hydrotherapy (Pulse lavage);Packing (Impregnated strip) 06/30/2016 10:56 AM     Wound / Incision (Open or Dehisced) 06/29/16 Incision - Open Arm Right;Lateral s/p I&D (Active)  Dressing Type Compression wrap;ABD;Gauze (Comment);Moist to dry 06/30/2016 10:56 AM  Dressing Changed Changed 06/30/2016 10:56 AM  Dressing Status Clean;Dry;Intact 06/30/2016 10:56 AM  Dressing Change Frequency Daily 06/30/2016 10:56 AM  Site / Wound Assessment Granulation  tissue;Red 06/30/2016 10:56 AM  % Wound base Red or Granulating 85% 06/30/2016 10:56 AM  % Wound base Yellow 0% 06/30/2016 10:56 AM  % Wound base Black 0% 06/30/2016 10:56 AM  % Wound base Other (Comment) 15% 06/30/2016 10:56 AM  Peri-wound Assessment Edema 06/30/2016 10:56 AM  Wound Length (cm) 4 cm 06/29/2016 10:21 AM  Wound Width (cm) 1.5 cm 06/29/2016 10:21 AM  Wound Depth (cm) 1 cm 06/29/2016 10:21 AM  Undermining (cm) 1-2 cm 6-2 oclock. >2 cm from 2-6 with likely extension to other wound. 06/29/2016 10:21 AM  Margins Unattached edges (unapproximated) 06/30/2016 10:56 AM  Closure None 06/30/2016 10:56 AM  Drainage Amount Moderate 06/30/2016 10:56 AM  Drainage Description Serosanguineous 06/30/2016 10:56 AM  Treatment Hydrotherapy (Pulse lavage);Packing (Impregnated strip) 06/30/2016 10:56 AM   Hydrotherapy Pulsed lavage therapy - wound location: rt forearm Pulsed Lavage with Suction (psi): 4 psi Pulsed Lavage with Suction - Normal Saline Used: 500 mL Pulsed Lavage Tip: Tip with splash shield   Wound Assessment and Plan  Wound Therapy - Assess/Plan/Recommendations Wound Therapy - Clinical Statement: Pt able to tolerate more PLS with addition of IV meds. Wound bed continues to look clean. Encouraged pt to perform active range of motion to RUE Wound Therapy - Functional Problem List: Decr use of RUE due to wound Factors Delaying/Impairing Wound Healing: Multiple medical problems;Polypharmacy;Substance abuse;Infection - systemic/local Hydrotherapy Plan: Dressing change;Patient/family education;Pulsatile lavage with suction Wound Therapy - Frequency: 6X / week Wound Therapy - Follow Up Recommendations: Other (comment) (Per MD) Wound Plan: See above  Wound Therapy Goals- Improve the function of patient's integumentary system by progressing the wound(s) through the phases of wound healing (inflammation - proliferation - remodeling) by: Decrease Length/Width/Depth by (cm): .  5 (undermining) Decrease  Length/Width/Depth - Progress: Progressing toward goal Improve Drainage Characteristics: Mod Improve Drainage Characteristics - Progress: Progressing toward goal  Goals will be updated until maximal potential achieved or discharge criteria met.  Discharge criteria: when goals achieved, discharge from hospital, MD decision/surgical intervention, no progress towards goals, refusal/missing three consecutive treatments without notification or medical reason.  GP     Chistina Roston 06/30/2016, 11:04 AM Suanne Marker PT [phone]

## 2016-06-30 NOTE — Progress Notes (Signed)
Subjective: 4 Days Post-Op Procedure(s) (LRB): IRRIGATION AND DEBRIDEMENT RIGHT FOREARM (Right) Patient reports pain as moderate.    Objective: Vital signs in last 24 hours: Temp:  [97.9 F (36.6 C)-99.1 F (37.3 C)] 99.1 F (37.3 C) (09/12 1228) Pulse Rate:  [54-64] 64 (09/12 1228) Resp:  [16] 16 (09/12 0540) BP: (104-129)/(57-70) 120/60 (09/12 1228) SpO2:  [99 %-100 %] 99 % (09/12 0540)  Intake/Output from previous day: 09/11 0701 - 09/12 0700 In: 360 [P.O.:360] Out: 400 [Urine:400] Intake/Output this shift: Total I/O In: 240 [P.O.:240] Out: -    Recent Labs  06/28/16 0349 06/29/16 0527  HGB 10.7* 11.4*    Recent Labs  06/28/16 0349 06/29/16 0527  WBC 10.8* 8.4  RBC 3.87* 4.16*  HCT 33.0* 35.5*  PLT 381 440*    Recent Labs  06/29/16 0527 06/30/16 0612  NA  --  143  K  --  3.7  CL  --  107  CO2  --  28  BUN  --  11  CREATININE 1.31* 2.14*  GLUCOSE  --  96  CALCIUM  --  8.1*   No results for input(s): LABPT, INR in the last 72 hours.  able to wiggle fingers.  no proximal streaking.  dressing clean/dry/intact.  Assessment/Plan: 4 Days Post-Op Procedure(s) (LRB): IRRIGATION AND DEBRIDEMENT RIGHT FOREARM (Right) Continue wound care.  Ok for d/c home from hand perspective once pain controlled on oral meds.  Will continue wound care at office after d/c.  Renaye Janicki R 06/30/2016, 3:55 PM

## 2016-06-30 NOTE — Care Management Note (Addendum)
Case Management Note  Patient Details  Name: Pedro Owens MRN: [id number] Date of Birth: Oct 02, 1991  Subjective/Objective:  Right Forearm Cellulitis/abscess                   Action/Plan: Discharge Planning:  NCM spoke to pt and lives in home with a friend. States he works full-time were his job requires he uses his hands. NCM encouraged pt to speak to his Human Resources about medical leave or disability benefits. States he was recently approved for Medicaid. States he will have his mother bring his Medicaid card. NCM explained Medicaid does cover transportation. Spoke to pt about HH at home for dressing changes. States he does not have transportation to go everyday to wound clinic. He is not directly on [place] to ride public transportation. Will follow up with surgeon for instruction on wound care clinic and dressing changes.     Expected Discharge Date:  07/01/2016              Expected Discharge Plan:  Home w Home Health Services  In-House Referral:  Clinical Social Work  Discharge planning Services  CM Consult  Post Acute Care Choice:  Home Health Choice offered to:  Patient  DME Arranged:  N/A DME Agency:  NA  HH Arranged:  RN HH Agency:  [facility]  Status of Service:  In process, will continue to follow  If discussed at Long Length of Stay Meetings, dates discussed:    Additional Comments:  Elliot CousinShavis, Ramandeep Arington Ellen, RN 06/30/2016, 5:17 PM

## 2016-06-30 NOTE — Progress Notes (Signed)
Progress Note    Pedro Owens  JXB:[id number] DOB: 1990-12-08  DOA: 06/25/2016 PCP: Diamantina Providenceakela N Anderson, FNP    Brief Narrative:   Chief complaint: Follow-up right arm cellulitis  Pedro Owens is an 25 y.o. male with a PMH of HIV, last CD4 count 798/2017, who was admitted 06/25/16 with a chief complaint of right forearm pain and swelling.  Assessment/Plan:   Principal Problem:   Right forearm cellulitis/Abscess/right forearm pain Empirically placed on vancomycin/Zosyn, Switched to amoxicillin after wound cultures grew group C strep. Follow-up final blood cultures, and operative wound cultures. CT of the forearm showed a large 9.1 X5.9X 2.0 cm abscess at the ulnar aspect of the midforearm, status post I&D 06/26/16. WBC much improved. Receiving hydrotherapy for wound care.Barrier to discharge is ongoing need for hydrotherapy with limited outpatient options as the patient does not have transportation and cannot have this done at home.  Active Problems:   Back pain Patient complains of bilateral flank pain today. Says this is a new pain. Will check a urinalysis.    Acute kidney injury May be related to NSAIDs. Motrin discontinued. May be related to vancomycin, which was discontinued 06/29/16. Check creatinine in the morning.    Postoperative anemia due to acute blood loss 4 g drop in hemoglobin over admission values, but hemoglobin now beginning to come up.    Constipation Opiate induced. Continue Senokot daily at bedtime.    HIV disease (HCC) Followed by Dr. Orvan Falconerampbell in the ID clinic. Continue Triumeq.    Cocaine abuse/polysubstance abuse Patient does have a history of cocaine and marijuana abuse. Urine drug screen confirms ongoing cocaine abuse. Social worker consultation for substance abuse counseling.   Family Communication/Anticipated D/C date and plan/Code Status   DVT prophylaxis: SCDs ordered. Code Status: Full Code.  Family Communication: No family at the  bedside. Disposition Plan: Home when medically stable postoperatively.   Medical Consultants:    Hand Surgery   Procedures:    Incision and drainage of right forearm abscess including inspection of tissues deep to fascia on 06/26/16 by Dr. Merlyn LotKuzma.  Anti-Infectives:    Vancomycin 06/25/16--->06/29/16  Zosyn 06/25/16---> 06/29/16  Amoxicillin 06/29/16--->  Subjective:   The patient's review of systems is + for 10/10 right forearm pain, severe with hydrotherapy session, some nausea and negative for vomiting, dyspnea.  Appetite is diminished. Now reports bilateral back pain in the flank area.  Objective:    Vitals:   06/29/16 0453 06/29/16 1233 06/29/16 2058 06/30/16 0540  BP: 114/60 126/73 129/70 (!) 104/57  Pulse: 66 83 (!) 54 (!) 58  Resp: 16 16 16 16   Temp: 98 F (36.7 C) 98.2 F (36.8 C) 97.9 F (36.6 C) 98 F (36.7 C)  TempSrc: Oral Oral Oral Oral  SpO2: 100% 99% 100% 99%  Weight:      Height:        Intake/Output Summary (Last 24 hours) at 06/30/16 0940 Last data filed at 06/29/16 2026  Gross per 24 hour  Intake              120 ml  Output                0 ml  Net              120 ml   Filed Weights   06/25/16 2352  Weight: 74.4 kg (164 lb)    Exam: Gen: Awake and alert. Chest: Lungs clear to auscultation bilaterally with good air  movement. Heart: Regular rate, and rhythm. No murmurs, rubs, or gallops. Extremities: Right arm wrapped in an Ace wrap. Left arm tender at prior IV site with some localized swelling.   Data Reviewed:   I have personally reviewed following labs and imaging studies:  Labs: Basic Metabolic Panel:  Recent Labs Lab 06/25/16 1820 06/25/16 2029 06/26/16 0132 06/26/16 1932 06/29/16 0527 06/30/16 0612  NA 137 140 138  --   --  143  K 3.7 3.4* 3.5  --   --  3.7  CL 101 100* 104  --   --  107  CO2 26  --  28  --   --  28  GLUCOSE 73 74 95  --   --  96  BUN 16 16 14   --   --  11  CREATININE 1.00 0.80 1.03 0.92 1.31* 2.14*   CALCIUM 9.1  --  8.2*  --   --  8.1*   GFR Estimated Creatinine Clearance: 56 mL/min (by C-G formula based on SCr of 2.14 mg/dL). Liver Function Tests:  Recent Labs Lab 06/25/16 1820 06/26/16 0132  AST 36 32  ALT 44 36  ALKPHOS 65 63  BILITOT 1.5* 1.0  PROT 7.8 6.9  ALBUMIN 3.5 3.3*    CBC:  Recent Labs Lab 06/25/16 1820  06/26/16 0132 06/26/16 1932 06/27/16 0417 06/28/16 0349 06/29/16 0527  WBC 23.1*  --  18.4* 20.6* 16.9* 10.8* 8.4  NEUTROABS 19.0*  --  14.7*  --   --   --   --   HGB 14.3  < > 12.7* 11.3* 10.9* 10.7* 11.4*  HCT 42.5  < > 37.9* 34.5* 33.4* 33.0* 35.5*  MCV 83.3  --  84.0 84.4 84.1 85.3 85.3  PLT 353  --  330 322 330 381 440*  < > = values in this interval not displayed. Sepsis Labs:  Recent Labs Lab 06/25/16 2117  06/26/16 1932 06/27/16 0417 06/28/16 0349 06/29/16 0527  WBC  --   < > 20.6* 16.9* 10.8* 8.4  LATICACIDVEN 1.13  --   --   --   --   --   < > = values in this interval not displayed.  Microbiology Recent Results (from the past 240 hour(s))  Culture, blood (routine x 2)     Status: None (Preliminary result)   Collection Time: 06/26/16  1:42 AM  Result Value Ref Range Status   Specimen Description LEFT ANTECUBITAL  Final   Special Requests BOTTLES DRAWN AEROBIC ONLY 6CC  Final   Culture   Final    NO GROWTH 3 DAYS Performed at [facility]    Report Status PENDING  Incomplete  Culture, blood (routine x 2)     Status: None (Preliminary result)   Collection Time: 06/26/16  1:42 AM  Result Value Ref Range Status   Specimen Description BLOOD LEFT ARM  Final   Special Requests BOTTLES DRAWN AEROBIC ONLY 8CC  Final   Culture   Final    NO GROWTH 3 DAYS Performed at [facility]    Report Status PENDING  Incomplete  Aerobic/Anaerobic Culture (surgical/deep wound)     Status: None (Preliminary result)   Collection Time: 06/26/16  2:34 PM  Result Value Ref Range Status   Specimen Description ABSCESS RIGHT  FOREARM  Final   Special Requests NONE  Final   Gram Stain   Final    MODERATE WBC PRESENT,BOTH PMN AND MONONUCLEAR ABUNDANT GRAM POSITIVE COCCI IN PAIRS AND  CHAINS MODERATE GRAM NEGATIVE RODS MODERATE GRAM POSITIVE COCCI IN CLUSTERS    Culture   Final    MODERATE STREPTOCOCCUS GROUP C HOLDING FOR POSSIBLE ANAEROBE    Report Status PENDING  Incomplete    Radiology: No results found.  Medications:   . abacavir-dolutegravir-lamiVUDine  1 tablet Oral Daily  . amoxicillin  500 mg Oral Q8H  . enoxaparin (LOVENOX) injection  40 mg Subcutaneous Q24H  . ibuprofen  600 mg Oral TID  . senna-docusate  1 tablet Oral QHS  . vitamin C  1,000 mg Oral Daily   Continuous Infusions:   This is of moderate complexity visit.   LOS: 5 days    Moderate medical decision making: Level II visit.  RAMA,CHRISTINA  [facility] Pager [phone]. If unable to reach me by pager, please call my cell phone at [phone].  *Please refer to [email or web address], password [email or web address] to get updated schedule on who will round on this patient, as hospitalists switch teams weekly. If 7PM-7AM, please contact night-coverage at [email or web address], password [email or web address] for any overnight needs.  06/30/2016, 9:40 AM

## 2016-06-30 NOTE — Care Management Note (Signed)
Case Management Note  Patient Details  Name: Pedro Owens MRN: [id number] Date of Birth: 03-11-1991  Subjective/Objective:   Pt admitted with infected forarm abscess                 Action/Plan:  Pt is from home.  Currently ordered hydrotherapy.  CM informed that [facility] will not provide service.  CM informed attending and requested possible out pt wound clinic consult.  CM will continue to follow for discharge needs   Expected Discharge Date:                  Expected Discharge Plan:  Home w Home Health Services  In-House Referral:     Discharge planning Services  CM Consult  Post Acute Care Choice:    Choice offered to:     DME Arranged:    DME Agency:     HH Arranged:    HH Agency:     Status of Service:  In process, will continue to follow  If discussed at Long Length of Stay Meetings, dates discussed:    Additional Comments 06/30/2016 Discussed in LOS 06/30/16:  Pt received peripheral access late last night and has received IV morphine over night for uncontrolled pain.  CM contacted both physical therapy in house and the [facility] and was informed by both services that hydrotherapy is not performed by their resources.  Physician Advisor is aware.  CM communicated this information to oncoming CM covering pt AS. Cherylann Parr, RN 06/30/2016, 9:04 AM

## 2016-07-01 ENCOUNTER — Telehealth: Payer: Self-pay | Admitting: *Deleted

## 2016-07-01 ENCOUNTER — Encounter (HOSPITAL_COMMUNITY): Payer: Self-pay | Admitting: *Deleted

## 2016-07-01 ENCOUNTER — Inpatient Hospital Stay (HOSPITAL_COMMUNITY): Payer: BLUE CROSS/BLUE SHIELD

## 2016-07-01 DIAGNOSIS — Z720 Tobacco use: Secondary | ICD-10-CM

## 2016-07-01 DIAGNOSIS — D62 Acute posthemorrhagic anemia: Secondary | ICD-10-CM

## 2016-07-01 DIAGNOSIS — F1910 Other psychoactive substance abuse, uncomplicated: Secondary | ICD-10-CM

## 2016-07-01 DIAGNOSIS — L03113 Cellulitis of right upper limb: Principal | ICD-10-CM

## 2016-07-01 DIAGNOSIS — B20 Human immunodeficiency virus [HIV] disease: Secondary | ICD-10-CM

## 2016-07-01 DIAGNOSIS — M5489 Other dorsalgia: Secondary | ICD-10-CM

## 2016-07-01 DIAGNOSIS — N179 Acute kidney failure, unspecified: Secondary | ICD-10-CM

## 2016-07-01 DIAGNOSIS — K5901 Slow transit constipation: Secondary | ICD-10-CM

## 2016-07-01 LAB — AEROBIC/ANAEROBIC CULTURE W GRAM STAIN (SURGICAL/DEEP WOUND)

## 2016-07-01 LAB — BASIC METABOLIC PANEL
ANION GAP: 9 (ref 5–15)
BUN: 11 mg/dL (ref 6–20)
CALCIUM: 8.1 mg/dL — AB (ref 8.9–10.3)
CO2: 24 mmol/L (ref 22–32)
Chloride: 106 mmol/L (ref 101–111)
Creatinine, Ser: 2.2 mg/dL — ABNORMAL HIGH (ref 0.61–1.24)
GFR, EST AFRICAN AMERICAN: 46 mL/min — AB (ref >60)
GFR, EST NON AFRICAN AMERICAN: 40 mL/min — AB (ref >60)
Glucose, Bld: 79 mg/dL (ref 65–99)
Potassium: 4.1 mmol/L (ref 3.5–5.1)
Sodium: 139 mmol/L (ref 135–145)

## 2016-07-01 LAB — CULTURE, BLOOD (ROUTINE X 2)
Culture: NO GROWTH
Culture: NO GROWTH

## 2016-07-01 LAB — AEROBIC/ANAEROBIC CULTURE (SURGICAL/DEEP WOUND)

## 2016-07-01 MED ORDER — INFLUENZA VAC SPLIT QUAD 0.5 ML IM SUSY
0.5000 mL | PREFILLED_SYRINGE | INTRAMUSCULAR | Status: AC
  Administered 2016-07-02: 0.5 mL via INTRAMUSCULAR
  Filled 2016-07-01: qty 0.5

## 2016-07-01 MED ORDER — SODIUM CHLORIDE 0.9 % IV SOLN
INTRAVENOUS | Status: DC
  Administered 2016-07-01 – 2016-07-03 (×5): via INTRAVENOUS

## 2016-07-01 NOTE — Progress Notes (Signed)
Progress Note    Pedro Owens  WUJ:[id number] DOB: 03/16/1991  DOA: 06/25/2016 PCP: Diamantina Providence, FNP    Subjective:   Still complaining about pain in both flanks.  Brief Narrative:   Chief complaint: Follow-up right arm cellulitis  Pedro Owens is an 25 y.o. male with a PMH of HIV, last CD4 count 798/2017, who was admitted 06/25/16 with a chief complaint of right forearm pain and swelling.  Assessment/Plan:   Principal Problem: Right forearm cellulitis/Abscess/right forearm pain Empirically placed on vancomycin/Zosyn, Switched to amoxicillin after wound cultures grew group C strep. Follow-up final blood cultures, and operative wound cultures. CT of the forearm showed a large 9.1 X5.9X 2.0 cm abscess at the ulnar aspect of the midforearm, status post I&D 06/26/16. WBC much improved. Receiving hydrotherapy for wound care.Barrier to discharge is ongoing need for hydrotherapy with limited outpatient options as the patient does not have transportation and cannot have this done at home. -From hand surgery standpoint okay for discharge, patient is staying the hospital because of acute renal failure.  Acute kidney injury -Unclear etiology, creatinine is 2.2, was 1.0 on admission. Unclear if it's secondary to vancomycin versus NSAIDs. -Started on IV fluids, check renal ultrasound, check BMP in a.m. -Needs to stay at least overnight to monitor renal function.  Back pain Patient complains of bilateral flank pain today. Says this is a new pain. Will check a urinalysis.  Postoperative anemia due to acute blood loss Hemoglobin was 14.3 on admission, dropped postoperatively to 11.4.  Constipation Opiate induced. Continue Senokot daily at bedtime.  HIV disease (HCC) Followed by Dr. Orvan Falconer in the ID clinic. Continue Triumeq.  Cocaine abuse/polysubstance abuse Patient does have a history of cocaine and marijuana abuse. Urine drug screen confirms ongoing cocaine abuse. Social worker  consultation for substance abuse counseling.   Family Communication/Anticipated D/C date and plan/Code Status   DVT prophylaxis: SCDs ordered. Code Status: Full Code.  Family Communication: No family at the bedside. Disposition Plan: Home when medically stable postoperatively.   Medical Consultants:    Hand Surgery   Procedures:    Incision and drainage of right forearm abscess including inspection of tissues deep to fascia on 06/26/16 by Dr. Merlyn Lot.  Anti-Infectives:    Vancomycin 06/25/16--->06/29/16  Zosyn 06/25/16---> 06/29/16  Amoxicillin 06/29/16--->    Objective:    Vitals:   06/30/16 0540 06/30/16 1228 06/30/16 2056 07/01/16 0540  BP: (!) 104/57 120/60 123/76 137/76  Pulse: (!) 58 64 65 (!) 56  Resp: 16  20 16   Temp: 98 F (36.7 C) 99.1 F (37.3 C) 99 F (37.2 C) 98.7 F (37.1 C)  TempSrc: Oral Oral Oral Oral  SpO2: 99%  100% 97%  Weight:      Height:       No intake or output data in the 24 hours ending 07/01/16 1611 Filed Weights   06/25/16 2352  Weight: 74.4 kg (164 lb)    Exam: Gen: Awake and alert. Chest: Lungs clear to auscultation bilaterally with good air movement. Heart: Regular rate, and rhythm. No murmurs, rubs, or gallops. Extremities: Right arm wrapped in an Ace wrap. Left arm tender at prior IV site with some localized swelling.   Data Reviewed:   I have personally reviewed following labs and imaging studies:  Labs: Basic Metabolic Panel:  Recent Labs Lab 06/25/16 1820 06/25/16 2029 06/26/16 0132 06/26/16 1932 06/29/16 0527 06/30/16 0612 07/01/16 0439  NA 137 140 138  --   --  143 139  K 3.7 3.4* 3.5  --   --  3.7 4.1  CL 101 100* 104  --   --  107 106  CO2 26  --  28  --   --  28 24  GLUCOSE 73 74 95  --   --  96 79  BUN 16 16 14   --   --  11 11  CREATININE 1.00 0.80 1.03 0.92 1.31* 2.14* 2.20*  CALCIUM 9.1  --  8.2*  --   --  8.1* 8.1*   GFR Estimated Creatinine Clearance: 54.5 mL/min (by C-G formula based on SCr  of 2.2 mg/dL (H)). Liver Function Tests:  Recent Labs Lab 06/25/16 1820 06/26/16 0132  AST 36 32  ALT 44 36  ALKPHOS 65 63  BILITOT 1.5* 1.0  PROT 7.8 6.9  ALBUMIN 3.5 3.3*    CBC:  Recent Labs Lab 06/25/16 1820  06/26/16 0132 06/26/16 1932 06/27/16 0417 06/28/16 0349 06/29/16 0527  WBC 23.1*  --  18.4* 20.6* 16.9* 10.8* 8.4  NEUTROABS 19.0*  --  14.7*  --   --   --   --   HGB 14.3  < > 12.7* 11.3* 10.9* 10.7* 11.4*  HCT 42.5  < > 37.9* 34.5* 33.4* 33.0* 35.5*  MCV 83.3  --  84.0 84.4 84.1 85.3 85.3  PLT 353  --  330 322 330 381 440*  < > = values in this interval not displayed. Sepsis Labs:  Recent Labs Lab 06/25/16 2117  06/26/16 1932 06/27/16 0417 06/28/16 0349 06/29/16 0527  WBC  --   < > 20.6* 16.9* 10.8* 8.4  LATICACIDVEN 1.13  --   --   --   --   --   < > = values in this interval not displayed.  Microbiology Recent Results (from the past 240 hour(s))  Culture, blood (routine x 2)     Status: None   Collection Time: 06/26/16  1:42 AM  Result Value Ref Range Status   Specimen Description LEFT ANTECUBITAL  Final   Special Requests BOTTLES DRAWN AEROBIC ONLY 6CC  Final   Culture   Final    NO GROWTH 5 DAYS Performed at [facility]    Report Status 07/01/2016 FINAL  Final  Culture, blood (routine x 2)     Status: None   Collection Time: 06/26/16  1:42 AM  Result Value Ref Range Status   Specimen Description BLOOD LEFT ARM  Final   Special Requests BOTTLES DRAWN AEROBIC ONLY 8CC  Final   Culture   Final    NO GROWTH 5 DAYS Performed at [facility]    Report Status 07/01/2016 FINAL  Final  Aerobic/Anaerobic Culture (surgical/deep wound)     Status: None   Collection Time: 06/26/16  2:34 PM  Result Value Ref Range Status   Specimen Description ABSCESS RIGHT FOREARM  Final   Special Requests NONE  Final   Gram Stain   Final    MODERATE WBC PRESENT,BOTH PMN AND MONONUCLEAR ABUNDANT GRAM POSITIVE COCCI IN PAIRS AND  CHAINS MODERATE GRAM NEGATIVE RODS MODERATE GRAM POSITIVE COCCI IN CLUSTERS    Culture   Final    MODERATE STREPTOCOCCUS GROUP C MIXED ANAEROBIC FLORA PRESENT.  CALL LAB IF FURTHER IID REQUIRED.    Report Status 07/01/2016 FINAL  Final    Radiology: No results found.  Medications:   . abacavir-dolutegravir-lamiVUDine  1 tablet Oral Daily  . amoxicillin  500 mg Oral Q8H  .  enoxaparin (LOVENOX) injection  40 mg Subcutaneous Q24H  . [START ON 07/02/2016] Influenza vac split quadrivalent PF  0.5 mL Intramuscular Tomorrow-1000  . polyethylene glycol  17 g Oral Daily  . senna-docusate  1 tablet Oral QHS  . vitamin C  1,000 mg Oral Daily   Continuous Infusions: . sodium chloride 100 mL/hr at 07/01/16 1157   This is of moderate complexity visit.   LOS: 6 days    Moderate medical decision making: Level II visit.   Aspire Health Partners IncELMAHI,Jaelah Hauth A  [facility] Pager [phone].    07/01/2016, 4:11 PM

## 2016-07-01 NOTE — Telephone Encounter (Signed)
Left message on machine to call back  

## 2016-07-01 NOTE — Progress Notes (Signed)
Physical Therapy Wound Treatment Patient Details  Name: Pedro Owens MRN: [id number] Date of Birth: [birth year]  Today's Date: 07/01/2016 Time: 9924-2683 Time Calculation (min): 22 min  Subjective  Subjective: Pt asking how many times we have to do this. Patient and Family Stated Goals: not stated Date of Onset: 06/23/16 Prior Treatments: surgical I&D  Pain Score:  moderate after premedication  Wound Assessment  Wound / Incision (Open or Dehisced) 06/29/16 Incision - Open Arm Right;Posterior s/p I&D (Active)  Dressing Type ABD;Compression wrap;Gauze (Comment);Moist to dry 07/01/2016 10:11 AM  Dressing Changed Changed 07/01/2016 10:11 AM  Dressing Status Clean;Dry;Intact 07/01/2016 10:11 AM  Dressing Change Frequency Daily 07/01/2016 10:11 AM  Site / Wound Assessment Bleeding;Red;Granulation tissue 07/01/2016 10:11 AM  % Wound base Red or Granulating 100% 07/01/2016 10:11 AM  % Wound base Yellow 0% 07/01/2016 10:11 AM  % Wound base Black 0% 07/01/2016 10:11 AM  % Wound base Other (Comment) 0% 07/01/2016 10:11 AM  Peri-wound Assessment Edema 07/01/2016 10:11 AM  Wound Length (cm) 11.5 cm 06/29/2016 10:21 AM  Wound Width (cm) 3.5 cm 06/29/2016 10:21 AM  Wound Depth (cm) 0.5 cm 06/29/2016 10:21 AM  Undermining (cm) Grossly 2-3 cm throughout with 9-12 oclock extending to other wound. 06/29/2016 10:21 AM  Margins Unattached edges (unapproximated) 07/01/2016 10:11 AM  Closure None 07/01/2016 10:11 AM  Drainage Amount Moderate 07/01/2016 10:11 AM  Drainage Description Serosanguineous 07/01/2016 10:11 AM  Treatment Hydrotherapy (Pulse lavage);Packing (Impregnated strip) 07/01/2016 10:11 AM     Wound / Incision (Open or Dehisced) 06/29/16 Incision - Open Arm Right;Lateral s/p I&D (Active)  Dressing Type Compression wrap;ABD;Gauze (Comment);Moist to dry 07/01/2016 10:11 AM  Dressing Changed Changed 07/01/2016 10:11 AM  Dressing Status Clean;Dry;Intact 07/01/2016 10:11 AM  Dressing Change Frequency Daily  07/01/2016 10:11 AM  Site / Wound Assessment Granulation tissue;Red 07/01/2016 10:11 AM  % Wound base Red or Granulating 85% 07/01/2016 10:11 AM  % Wound base Yellow 0% 07/01/2016 10:11 AM  % Wound base Black 0% 07/01/2016 10:11 AM  % Wound base Other (Comment) 15% 07/01/2016 10:11 AM  Peri-wound Assessment Edema 07/01/2016 10:11 AM  Wound Length (cm) 4 cm 06/29/2016 10:21 AM  Wound Width (cm) 1.5 cm 06/29/2016 10:21 AM  Wound Depth (cm) 1 cm 06/29/2016 10:21 AM  Undermining (cm) 1-2 cm 6-2 oclock. >2 cm from 2-6 with likely extension to other wound. 06/29/2016 10:21 AM  Margins Unattached edges (unapproximated) 07/01/2016 10:11 AM  Closure None 07/01/2016 10:11 AM  Drainage Amount Moderate 07/01/2016 10:11 AM  Drainage Description Serosanguineous 07/01/2016 10:11 AM  Treatment Hydrotherapy (Pulse lavage);Packing (Impregnated strip) 07/01/2016 10:11 AM   Hydrotherapy Pulsed lavage therapy - wound location: rt forearm Pulsed Lavage with Suction (psi): 4 psi Pulsed Lavage with Suction - Normal Saline Used: 500 mL Pulsed Lavage Tip: Tip with splash shield   Wound Assessment and Plan  Wound Therapy - Assess/Plan/Recommendations Wound Therapy - Clinical Statement: Pt tolerated well. Wound continues to look good. Encourage to perform AROM. From hydro standpoint pt ready for DC. Wound Therapy - Functional Problem List: Decr use of RUE due to wound Factors Delaying/Impairing Wound Healing: Multiple medical problems;Polypharmacy;Substance abuse;Infection - systemic/local Hydrotherapy Plan: Dressing change;Patient/family education;Pulsatile lavage with suction Wound Therapy - Frequency: 6X / week Wound Therapy - Follow Up Recommendations: Other (comment) (Per MD) Wound Plan: See above  Wound Therapy Goals- Improve the function of patient's integumentary system by progressing the wound(s) through the phases of wound healing (inflammation - proliferation - remodeling) by: Decrease Length/Width/Depth by (  cm):  .5 (undermining) Decrease Length/Width/Depth - Progress: Progressing toward goal Improve Drainage Characteristics: Mod Improve Drainage Characteristics - Progress: Met  Goals will be updated until maximal potential achieved or discharge criteria met.  Discharge criteria: when goals achieved, discharge from hospital, MD decision/surgical intervention, no progress towards goals, refusal/missing three consecutive treatments without notification or medical reason.  GP     Jaselynn Tamas 07/01/2016, 10:15 AM Suanne Marker PT [phone]

## 2016-07-01 NOTE — Telephone Encounter (Signed)
Patient's mother called, asking how we could help him get to his appointments. [facility]. He has no transportation. He is to be discharged tomorrow 9/14, but they don't even know how they will get home from the hospital. RN offered for the patient or his family member to come pick up bus tickets from [employer]. Patient's mother declined, asked if we could walk them over to his hospital room or if we could mail them to his home address. RN stated we could mail them, but it sounds like he will need more case management services.  RN [facility] patient's [facility], offered to communicate with [employer] [facility].  Patient's mother was excited, stated that [facility] could visit daily for dressing changes (patient had surgery). RN advised that [facility] offered a different kind of service, not wound care, but that the hospital case manager for her son would be the best resource for this referral. Andree CossHowell, Kambra Beachem M, RN

## 2016-07-02 DIAGNOSIS — A4902 Methicillin resistant Staphylococcus aureus infection, unspecified site: Secondary | ICD-10-CM

## 2016-07-02 LAB — BASIC METABOLIC PANEL
Anion gap: 6 (ref 5–15)
BUN: 8 mg/dL (ref 6–20)
CHLORIDE: 108 mmol/L (ref 101–111)
CO2: 27 mmol/L (ref 22–32)
CREATININE: 2.08 mg/dL — AB (ref 0.61–1.24)
Calcium: 8.2 mg/dL — ABNORMAL LOW (ref 8.9–10.3)
GFR calc Af Amer: 50 mL/min — ABNORMAL LOW (ref >60)
GFR calc non Af Amer: 43 mL/min — ABNORMAL LOW (ref >60)
Glucose, Bld: 122 mg/dL — ABNORMAL HIGH (ref 65–99)
Potassium: 4 mmol/L (ref 3.5–5.1)
Sodium: 141 mmol/L (ref 135–145)

## 2016-07-02 MED ORDER — MAGNESIUM HYDROXIDE 400 MG/5ML PO SUSP
30.0000 mL | Freq: Once | ORAL | Status: AC
  Administered 2016-07-02: 30 mL via ORAL
  Filled 2016-07-02: qty 30

## 2016-07-02 NOTE — Progress Notes (Signed)
Progress Note    WITTEN CERTAIN  ZOX:[id number] DOB: 11/25/1990  DOA: 06/25/2016 PCP: Diamantina Providence, FNP    Subjective:   No much complaint, Creatinine is still at 2.0, continue IV fluids check BMP in a.m.  Brief Narrative:   Chief complaint: Follow-up right arm cellulitis  Pedro Owens is an 25 y.o. male with a PMH of HIV, last CD4 count 798/2017, who was admitted 06/25/16 with a chief complaint of right forearm pain and swelling.  Assessment/Plan:   Principal Problem: Right forearm cellulitis/Abscess/right forearm pain Empirically placed on vancomycin/Zosyn, Switched to amoxicillin after wound cultures grew group C strep. Follow-up final blood cultures, and operative wound cultures. CT of the forearm showed a large 9.1 X5.9X 2.0 cm abscess at the ulnar aspect of the midforearm, status post I&D 06/26/16. WBC much improved. Receiving hydrotherapy for wound care.Barrier to discharge is ongoing need for hydrotherapy with limited outpatient options as the patient does not have transportation and cannot have this done at home. -From hand surgery standpoint okay for discharge, patient is staying the hospital because of acute Kidney injury. -Currently on amoxicillin.  Acute kidney injury -Unclear etiology, creatinine is 2.2, was 1.0 on admission. Unclear if it's secondary to vancomycin versus NSAIDs. -Renal ultrasound without hydronephrosis or obstruction, started on IV fluids. -Was on vancomycin, this is discontinued, check BMP in a.m.  Back pain Patient complains of bilateral flank pain today. Says this is a new pain. Will check a urinalysis.  Postoperative anemia due to acute blood loss Hemoglobin was 14.3 on admission, dropped postoperatively to 11.4.  Constipation Opiate induced. Continue Senokot daily at bedtime.  HIV disease (HCC) Followed by Dr. Orvan Falconer in the ID clinic. Continue Triumeq.  Cocaine abuse/polysubstance abuse Patient does have a history of cocaine and  marijuana abuse. Urine drug screen confirms ongoing cocaine abuse. Social worker consultation for substance abuse counseling.   Family Communication/Anticipated D/C date and plan/Code Status   DVT prophylaxis: SCDs ordered. Code Status: Full Code.  Family Communication: No family at the bedside. Disposition Plan: Home when medically stable postoperatively.   Medical Consultants:    Hand Surgery   Procedures:    Incision and drainage of right forearm abscess including inspection of tissues deep to fascia on 06/26/16 by Dr. Merlyn Lot.  Anti-Infectives:    Vancomycin 06/25/16--->06/29/16  Zosyn 06/25/16---> 06/29/16  Amoxicillin 06/29/16--->    Objective:    Vitals:   06/30/16 1228 06/30/16 2056 07/01/16 0540 07/01/16 2143  BP: 120/60 123/76 137/76 121/70  Pulse: 64 65 (!) 56 (!) 59  Resp:  20 16 17   Temp: 99.1 F (37.3 C) 99 F (37.2 C) 98.7 F (37.1 C) 98.8 F (37.1 C)  TempSrc: Oral Oral Oral Oral  SpO2:  100% 97% 98%  Weight:      Height:        Intake/Output Summary (Last 24 hours) at 07/02/16 1243 Last data filed at 07/02/16 0900  Gross per 24 hour  Intake             2620 ml  Output             2250 ml  Net              370 ml   Filed Weights   06/25/16 2352  Weight: 74.4 kg (164 lb)    Exam: Gen: Awake and alert. Chest: Lungs clear to auscultation bilaterally with good air movement. Heart: Regular rate, and rhythm. No murmurs, rubs, or gallops.  Extremities: Right arm wrapped in an Ace wrap. Left arm tender at prior IV site with some localized swelling.   Data Reviewed:   I have personally reviewed following labs and imaging studies:  Labs: Basic Metabolic Panel:  Recent Labs Lab 06/25/16 1820 06/25/16 2029 06/26/16 0132 06/26/16 1932 06/29/16 0527 06/30/16 0612 07/01/16 0439 07/02/16 0814  NA 137 140 138  --   --  143 139 141  K 3.7 3.4* 3.5  --   --  3.7 4.1 4.0  CL 101 100* 104  --   --  107 106 108  CO2 26  --  28  --   --  28 24  27   GLUCOSE 73 74 95  --   --  96 79 122*  BUN 16 16 14   --   --  11 11 8   CREATININE 1.00 0.80 1.03 0.92 1.31* 2.14* 2.20* 2.08*  CALCIUM 9.1  --  8.2*  --   --  8.1* 8.1* 8.2*   GFR Estimated Creatinine Clearance: 57.6 mL/min (by C-G formula based on SCr of 2.08 mg/dL (H)). Liver Function Tests:  Recent Labs Lab 06/25/16 1820 06/26/16 0132  AST 36 32  ALT 44 36  ALKPHOS 65 63  BILITOT 1.5* 1.0  PROT 7.8 6.9  ALBUMIN 3.5 3.3*    CBC:  Recent Labs Lab 06/25/16 1820  06/26/16 0132 06/26/16 1932 06/27/16 0417 06/28/16 0349 06/29/16 0527  WBC 23.1*  --  18.4* 20.6* 16.9* 10.8* 8.4  NEUTROABS 19.0*  --  14.7*  --   --   --   --   HGB 14.3  < > 12.7* 11.3* 10.9* 10.7* 11.4*  HCT 42.5  < > 37.9* 34.5* 33.4* 33.0* 35.5*  MCV 83.3  --  84.0 84.4 84.1 85.3 85.3  PLT 353  --  330 322 330 381 440*  < > = values in this interval not displayed. Sepsis Labs:  Recent Labs Lab 06/25/16 2117  06/26/16 1932 06/27/16 0417 06/28/16 0349 06/29/16 0527  WBC  --   < > 20.6* 16.9* 10.8* 8.4  LATICACIDVEN 1.13  --   --   --   --   --   < > = values in this interval not displayed.  Microbiology Recent Results (from the past 240 hour(s))  Culture, blood (routine x 2)     Status: None   Collection Time: 06/26/16  1:42 AM  Result Value Ref Range Status   Specimen Description LEFT ANTECUBITAL  Final   Special Requests BOTTLES DRAWN AEROBIC ONLY 6CC  Final   Culture   Final    NO GROWTH 5 DAYS Performed [facility]    Report Status 07/01/2016 FINAL  Final  Culture, blood (routine x 2)     Status: None   Collection Time: 06/26/16  1:42 AM  Result Value Ref Range Status   Specimen Description BLOOD LEFT ARM  Final   Special Requests BOTTLES DRAWN AEROBIC ONLY 8CC  Final   Culture   Final    NO GROWTH 5 DAYS [facility]formed at [facility]     Report Status 07/01/2016 FINAL  Final  Aerobic/Anaerobic Culture (surgical/deep wound)     Status: None   Collection Time:  06/26/16  2:34 PM  Result Value Ref Range Status   Specimen Description ABSCESS RIGHT FOREARM  Final   Special Requests NONE  Final   Gram Stain   Final    MODERATE WBC PRESENT,BOTH PMN AND MONONUCLEAR ABUNDANT  GRAM POSITIVE COCCI IN PAIRS AND CHAINS MODERATE GRAM NEGATIVE RODS MODERATE GRAM POSITIVE COCCI IN CLUSTERS    Culture   Final    MODERATE STREPTOCOCCUS GROUP C MIXED ANAEROBIC FLORA PRESENT.  CALL LAB IF FURTHER IID REQUIRED.    Report Status 07/01/2016 FINAL  Final    Radiology: US Renal  Result Date: 07/01/2016 CLINICAL DATA:  Acute kidney injury with bilateral flank pain today. EXAM: RENAL / URINARY TRACT ULTRASOUND COMPLETE COMPARISON:  CT scan 08/15/2015. FINDINGS: Right Kidney: Length: 12.9 cm. Parenchyma appears higher in echogenicity compared to the liver. No hydronephrosis. Left Kidney: Length: 12.5 cm. Echogenicity within normal limits. No mass or hydronephrosis visualized. Bladder: Appears normal for degree of bladder distention. IMPRESSION: No evidence for hydronephrosis. Electronically Signed   By: Kennith Center M.D.   On: 07/01/2016 16:43    Medications:   . abacavir-dolutegravir-lamiVUDine  1 tablet Oral Daily  . amoxicillin  500 mg Oral Q8H  . enoxaparin (LOVENOX) injection  40 mg Subcutaneous Q24H  . polyethylene glycol  17 g Oral Daily  . senna-docusate  1 tablet Oral QHS  . vitamin C  1,000 mg Oral Daily   Continuous Infusions: . sodium chloride 100 mL/hr at 07/02/16 0905   This is of moderate complexity visit.   LOS: 7 days    Moderate medical decision making: Level II visit.   [facility]  [facility] Pager [phone].    07/02/2016, 12:43 PM

## 2016-07-02 NOTE — Progress Notes (Signed)
Physical Therapy Wound Treatment Patient Details  Name: Pedro Owens MRN: [id number] Date of Birth: 12/06/1990  Today's Date: 07/02/2016 Time: 1022-1040 Time Calculation (min): 18 min  Subjective  Subjective: Pt agrees that pain with hydrotherapy has improved since we first started. Patient and Family Stated Goals: not stated Date of Onset: 06/23/16 Prior Treatments: surgical I&D  Pain Score: Pain Score: Premedicated. Moderate pain with PLS.  Wound Assessment  Wound / Incision (Open or Dehisced) 06/29/16 Incision - Open Arm Right;Posterior s/p I&D (Active)  Dressing Type ABD;Compression wrap;Gauze (Comment);Moist to dry 07/02/2016 11:25 AM  Dressing Changed Changed 07/02/2016 11:25 AM  Dressing Status Clean;Dry;Intact 07/02/2016 11:25 AM  Dressing Change Frequency Daily 07/02/2016 11:25 AM  Site / Wound Assessment Red;Granulation tissue 07/02/2016 11:25 AM  % Wound base Red or Granulating 100% 07/02/2016 11:25 AM  % Wound base Yellow 0% 07/02/2016 11:25 AM  % Wound base Black 0% 07/02/2016 11:25 AM  % Wound base Other (Comment) 0% 07/02/2016 11:25 AM  Peri-wound Assessment Edema 07/02/2016 11:25 AM  Wound Length (cm) 11.5 cm 06/29/2016 10:21 AM  Wound Width (cm) 3.5 cm 06/29/2016 10:21 AM  Wound Depth (cm) 0.5 cm 06/29/2016 10:21 AM  Undermining (cm) Grossly 2-3 cm throughout with 9-12 oclock extending to other wound. 06/29/2016 10:21 AM  Margins Unattached edges (unapproximated) 07/02/2016 11:25 AM  Closure None 07/02/2016 11:25 AM  Drainage Amount Moderate 07/02/2016 11:25 AM  Drainage Description Serosanguineous 07/02/2016 11:25 AM  Treatment Hydrotherapy (Pulse lavage);Packing (Saline gauze) 07/02/2016 11:25 AM     Wound / Incision (Open or Dehisced) 06/29/16 Incision - Open Arm Right;Lateral s/p I&D (Active)  Dressing Type Compression wrap;ABD;Gauze (Comment);Moist to dry 07/02/2016 11:25 AM  Dressing Changed Changed 07/02/2016 11:25 AM  Dressing Status Clean;Dry;Intact 07/02/2016 11:25 AM   Dressing Change Frequency Daily 07/02/2016 11:25 AM  Site / Wound Assessment Granulation tissue;Red 07/02/2016 11:25 AM  % Wound base Red or Granulating 85% 07/02/2016 11:25 AM  % Wound base Yellow 0% 07/02/2016 11:25 AM  % Wound base Black 0% 07/02/2016 11:25 AM  % Wound base Other (Comment) 15% 07/02/2016 11:25 AM  Peri-wound Assessment Edema 07/02/2016 11:25 AM  Wound Length (cm) 4 cm 06/29/2016 10:21 AM  Wound Width (cm) 1.5 cm 06/29/2016 10:21 AM  Wound Depth (cm) 1 cm 06/29/2016 10:21 AM  Undermining (cm) 1-2 cm 6-2 oclock. >2 cm from 2-6 with likely extension to other wound. 06/29/2016 10:21 AM  Margins Unattached edges (unapproximated) 07/02/2016 11:25 AM  Closure None 07/02/2016 11:25 AM  Drainage Amount Moderate 07/02/2016 11:25 AM  Drainage Description Serosanguineous 07/02/2016 11:25 AM  Treatment Hydrotherapy (Pulse lavage);Packing (Saline gauze) 07/02/2016 11:25 AM   Hydrotherapy Pulsed lavage therapy - wound location: rt forearm Pulsed Lavage with Suction (psi): 4 psi Pulsed Lavage with Suction - Normal Saline Used: 500 mL Pulsed Lavage Tip: Tip with splash shield   Wound Assessment and Plan  Wound Therapy - Assess/Plan/Recommendations Wound Therapy - Clinical Statement: Wound continues to improve with excellent granulation. Pt with transportation issues. From hydrotherapy standpoint pt could return home with [facility] for initial dressing changes and to teach family to perform. Pt would obviously need hand surgery follow up in the office. Wound Therapy - Functional Problem List: Decr use of RUE due to wound Factors Delaying/Impairing Wound Healing: Multiple medical problems;Polypharmacy;Substance abuse;Infection - systemic/local Hydrotherapy Plan: Dressing change;Patient/family education;Pulsatile lavage with suction Wound Therapy - Frequency: 6X / week Wound Therapy - Follow Up Recommendations: Home health RN Wound Plan: See above  Wound Therapy Goals-  Improve the function of  patient's integumentary system by progressing the wound(s) through the phases of wound healing (inflammation - proliferation - remodeling) by: Decrease Length/Width/Depth by (cm): .5 (undermining) Decrease Length/Width/Depth - Progress: Progressing toward goal Improve Drainage Characteristics: Mod Improve Drainage Characteristics - Progress: Met  Goals will be updated until maximal potential achieved or discharge criteria met.  Discharge criteria: when goals achieved, discharge from hospital, MD decision/surgical intervention, no progress towards goals, refusal/missing three consecutive treatments without notification or medical reason.  GP     Doron Shake 07/02/2016, 11:29 AM Suanne Marker PT [phone]

## 2016-07-02 NOTE — Telephone Encounter (Signed)
Thank you so much for clarifying that Marcelino Duster!

## 2016-07-03 LAB — BASIC METABOLIC PANEL
Anion gap: 3 — ABNORMAL LOW (ref 5–15)
BUN: 8 mg/dL (ref 6–20)
CHLORIDE: 110 mmol/L (ref 101–111)
CO2: 29 mmol/L (ref 22–32)
CREATININE: 2.03 mg/dL — AB (ref 0.61–1.24)
Calcium: 8.2 mg/dL — ABNORMAL LOW (ref 8.9–10.3)
GFR calc Af Amer: 51 mL/min — ABNORMAL LOW (ref >60)
GFR calc non Af Amer: 44 mL/min — ABNORMAL LOW (ref >60)
GLUCOSE: 105 mg/dL — AB (ref 65–99)
Potassium: 4 mmol/L (ref 3.5–5.1)
Sodium: 142 mmol/L (ref 135–145)

## 2016-07-03 MED ORDER — ONDANSETRON HCL 4 MG PO TABS: 4.0000 mg | ORAL_TABLET | ORAL | 0 refills | Status: DC | PRN

## 2016-07-03 MED ORDER — AMOXICILLIN 500 MG PO CAPS: 500.0000 mg | ORAL_CAPSULE | Freq: Three times a day (TID) | ORAL | 0 refills | Status: DC

## 2016-07-03 NOTE — Progress Notes (Signed)
Reviewed discharge information/medications with patient.  Answered all of his questions.  

## 2016-07-03 NOTE — Care Management Note (Signed)
Case Management Note  Patient Details  Name: Pedro Owens MRN: [id number] Date of Birth: 10-02-1991  Subjective/Objective:  Please see previous NCM notes                  Action/Plan: Discharge Planning: AVS reviewed:  Contacted [facility] rep with new referral for qd wet to dry dressing changes. Pt did not have Medicaid information. Explained he may have a copay for [facility] RN for dressing changes. Encouraged him to follow up with his Medicaid Case Worker. NCM sent message to Financial Counselor to check on Medicaid status.   PCP - Diamantina Providence MD  Expected Discharge Date:  07/03/2016              Expected Discharge Plan:  Home w Home Health Services  In-House Referral:  Clinical Social Work  Discharge planning Services  CM Consult  Post Acute Care Choice:  Home Health Choice offered to:  Patient  DME Arranged:  N/A DME Agency:  NA  HH Arranged:  RN HH Agency:  [facility]  Status of Service:  Completed, signed off  If discussed at Long Length of Stay Meetings, dates discussed:    Additional Comments:  Elliot Cousin, RN 07/03/2016, 3:16 PM

## 2016-07-03 NOTE — Progress Notes (Signed)
Physical Therapy Wound Treatment Patient Details  Name: Pedro Owens MRN: [id number] Date of Birth: 08-Jun-1991  Today's Date: 07/03/2016 Time: 1050-1110 Time Calculation (min): 20 min  Subjective  Subjective: Pt hoping to go home. Patient and Family Stated Goals: not stated Date of Onset: 06/23/16 Prior Treatments: surgical I&D  Pain Score:  controlled with premedication  Wound Assessment  Wound / Incision (Open or Dehisced) 06/29/16 Incision - Open Arm Right;Posterior s/p I&D (Active)  Dressing Type ABD;Compression wrap;Gauze (Comment);Moist to dry 07/03/2016 11:31 AM  Dressing Changed Changed 07/03/2016 11:31 AM  Dressing Status Clean;Dry;Intact 07/03/2016 11:31 AM  Dressing Change Frequency Daily 07/03/2016 11:31 AM  Site / Wound Assessment Red;Granulation tissue 07/03/2016 11:31 AM  % Wound base Red or Granulating 100% 07/03/2016 11:31 AM  % Wound base Yellow 0% 07/03/2016 11:31 AM  % Wound base Black 0% 07/03/2016 11:31 AM  % Wound base Other (Comment) 0% 07/03/2016 11:31 AM  Peri-wound Assessment Edema 07/03/2016 11:31 AM  Wound Length (cm) 11.5 cm 06/29/2016 10:21 AM  Wound Width (cm) 3.5 cm 06/29/2016 10:21 AM  Wound Depth (cm) 0.5 cm 06/29/2016 10:21 AM  Undermining (cm) Grossly 2-3 cm throughout with 9-12 oclock extending to other wound. 06/29/2016 10:21 AM  Margins Unattached edges (unapproximated) 07/03/2016 11:31 AM  Closure None 07/03/2016 11:31 AM  Drainage Amount Moderate 07/03/2016 11:31 AM  Drainage Description Serosanguineous 07/03/2016 11:31 AM  Treatment Hydrotherapy (Pulse lavage);Packing (Saline gauze) 07/03/2016 11:31 AM     Wound / Incision (Open or Dehisced) 06/29/16 Incision - Open Arm Right;Lateral s/p I&D (Active)  Dressing Type Compression wrap;ABD;Gauze (Comment);Moist to dry 07/03/2016 11:31 AM  Dressing Changed Changed 07/03/2016 11:31 AM  Dressing Status Clean;Dry;Intact 07/03/2016 11:31 AM  Dressing Change Frequency Daily 07/03/2016 11:31 AM  Site / Wound  Assessment Granulation tissue;Red 07/03/2016 11:31 AM  % Wound base Red or Granulating 85% 07/03/2016 11:31 AM  % Wound base Yellow 0% 07/03/2016 11:31 AM  % Wound base Black 0% 07/03/2016 11:31 AM  % Wound base Other (Comment) 15% 07/03/2016 11:31 AM  Peri-wound Assessment Edema 07/03/2016 11:31 AM  Wound Length (cm) 4 cm 06/29/2016 10:21 AM  Wound Width (cm) 1.5 cm 06/29/2016 10:21 AM  Wound Depth (cm) 1 cm 06/29/2016 10:21 AM  Undermining (cm) 1-2 cm 6-2 oclock. >2 cm from 2-6 with likely extension to other wound. 06/29/2016 10:21 AM  Margins Unattached edges (unapproximated) 07/03/2016 11:31 AM  Closure None 07/03/2016 11:31 AM  Drainage Amount Moderate 07/03/2016 11:31 AM  Drainage Description Serosanguineous 07/03/2016 11:31 AM  Treatment Hydrotherapy (Pulse lavage);Packing (Saline gauze) 07/03/2016 11:31 AM   Hydrotherapy Pulsed lavage therapy - wound location: rt forearm Pulsed Lavage with Suction (psi): 4 psi Pulsed Lavage with Suction - Normal Saline Used: 500 mL Pulsed Lavage Tip: Tip with splash shield   Wound Assessment and Plan  Wound Therapy - Assess/Plan/Recommendations Wound Therapy - Clinical Statement: Wound continues to look excellent. Wound Therapy - Functional Problem List: Decr use of RUE due to wound Factors Delaying/Impairing Wound Healing: Multiple medical problems;Polypharmacy;Substance abuse;Infection - systemic/local Hydrotherapy Plan: Dressing change;Patient/family education;Pulsatile lavage with suction Wound Therapy - Frequency: 6X / week Wound Therapy - Follow Up Recommendations: Home health RN Wound Plan: See above  Wound Therapy Goals- Improve the function of patient's integumentary system by progressing the wound(s) through the phases of wound healing (inflammation - proliferation - remodeling) by: Decrease Length/Width/Depth by (cm): .5 (undermining) Decrease Length/Width/Depth - Progress: Progressing toward goal Improve Drainage Characteristics: Mod Improve  Drainage Characteristics - Progress:  Met  Goals will be updated until maximal potential achieved or discharge criteria met.  Discharge criteria: when goals achieved, discharge from hospital, MD decision/surgical intervention, no progress towards goals, refusal/missing three consecutive treatments without notification or medical reason.  GP     Henslee Lottman 07/03/2016, 11:39 AM Suanne Marker PT [phone]

## 2016-07-03 NOTE — Discharge Summary (Signed)
Physician Discharge Summary  WILL HEINKEL SKA:[id number] DOB: [birth year] DOA: 06/25/2016  PCP: Diamantina Providence, FNP  Admit date: 06/25/2016 Discharge date: 07/03/2016  Admitted From: Home Disposition: Home  Recommendations for Outpatient Follow-up:  1. Follow up with Dr. Merlyn Lot and Dr. Orvan Falconer in 1-2 weeks 2. Please obtain BMP/CBC in one week  Home Health: RN for wound dressing Equipment/Devices: None  Discharge Condition: Stable CODE STATUS: Full code Diet recommendation: Regular diet  Brief/Interim Summary: Pedro Owens is a 25 y.o. male with history of HIV last CD4 count in August 2017 was 790, presents to the ER because of worsening pain and swelling of the right forearm. Patient states his symptoms started 4 days ago with pain and swelling which has gradually worsened. Patient is able to move his forearm at both the elbow and wrist area. On exam patient has swelling around the proximal aspect of the forearm. ER physician had done a sonogram at the bedside and as per the ER physician there was no definite abscess. Patient is being admitted for further management of cellulitis with possible developing abscess. On my exam patient still has significant pain. Has also been having some subjective feeling of fever and chills. Denies any trauma or injecting any drugs on to the arm or insect bites.   Discharge Diagnoses:  Active Problems:   Constipation   HIV disease (HCC)   Cigarette smoker   Polysubstance abuse   Cellulitis of right forearm   Right forearm pain   Postoperative anemia due to acute blood loss   AKI (acute kidney injury) (HCC)   Streptococcus infection, group C   Back pain   Right forearm cellulitis/Abscess/right forearm pain Empirically placed on vancomycin/Zosyn, Switched to amoxicillin after wound cultures grew group C strep.  CT of the forearm showed a large 9.1 X5.9X 2.0 cm abscess at the ulnar aspect of the midforearm, status post I&D 06/26/16. Received  hydrotherapy while he was in the hospital. Home health RN for wet-to-dry dressing changes. Amoxicillin for 10 more days, patient follow-up with Dr. Merlyn Lot in 1-2 weeks.  Acute kidney injury -Unclear etiology, creatinine was 1.0 on admission, went up to 2.2. Could be secondary to vancomycin, NSAIDs or contrast -Renal ultrasound without hydronephrosis or obstruction. -Patient hydrated very well with IV fluids as creatinine went down to 2.0. -This is nonoliguric, after discussion with them has previous episode in 2011 after contrast medium. -Stable, will discharge home, follow-up BMP in 10-14 days.  Back pain Urinalysis negative, this is likely muscular skeletal pain from the hospital bed, this resolved prior to discharge.  Postoperative anemia due to acute blood loss Hemoglobin was 14.3 on admission, dropped postoperatively to 11.4.  Constipation Opiate induced. Continue Senokot daily at bedtime.  HIV disease (HCC) Followed by Dr. Orvan Falconer in the ID clinic. Continue Triumeq.  Cocaine abuse/polysubstance abuse Patient does have a history of cocaine and marijuana abuse. Urine drug screen confirms ongoing cocaine abuse. Social worker consultation for substance abuse counseling. He denies any injections or use of needles at the cellulitis site.  Discharge Instructions  Discharge Instructions    Diet - low sodium heart healthy    Complete by:  As directed    Increase activity slowly    Complete by:  As directed        Medication List    TAKE these medications   abacavir-dolutegravir-lamiVUDine 600-50-300 MG tablet Commonly known as:  TRIUMEQ Take 1 tablet by mouth daily.   amoxicillin 500 MG capsule Commonly known as:  AMOXIL Take 1 capsule (500 mg total) by mouth 3 (three) times daily.   desipramine 25 MG tablet Commonly known as:  NORPRAMIN Take 1 tablet (25 mg total) by mouth at bedtime.   Na Sulfate-K Sulfate-Mg Sulf 17.5-3.13-1.6 GM/180ML Soln Commonly known  as:  SUPREP BOWEL PREP KIT Use as directed   ondansetron 4 MG tablet Commonly known as:  ZOFRAN Take 1 tablet (4 mg total) by mouth every 4 (four) hours as needed for nausea.      Follow-up Loop, MD.   Specialty:  Orthopedic Surgery Contact information: [place] [address] [phone]        [facility] .   Specialty:  Radiology Contact information: [address] [id number] mc [place]       Michel Bickers, MD Follow up in 1 week(s).   Specialty:  Infectious Diseases Contact information: [address] [place] [address] [phone]          Allergies  Allergen Reactions  . Acetaminophen Diarrhea    Flares up crohns disease  . Vicodin [Hydrocodone-Acetaminophen] Nausea And Vomiting  . Dilaudid [Hydromorphone Hcl] Hives and Rash    Consultations:  Hand surgery, Dr. Fredna Dow   Procedures/Studies: Ct Forearm Right W Contrast  Result Date: 06/26/2016 CLINICAL DATA:  Acute onset of right forearm pain, erythema and swelling. Fever. Initial encounter. EXAM: CT OF THE RIGHT FOREARM WITH CONTRAST TECHNIQUE: Multidetector CT imaging was performed following the standard protocol during bolus administration of intravenous contrast. CONTRAST:  17m ISOVUE-300 IOPAMIDOL (ISOVUE-300) INJECTION 61% COMPARISON:  Right forearm radiographs performed 03/09/2008 FINDINGS: There is a large 9.1 x 5.8 x 2.0 cm peripherally enhancing abscess at the ulnar aspect of the mid forearm, with overlying diffuse soft tissue edema extending along much of the ulnar and dorsal forearm. This appears to arise within the musculature, and is mildly complex in appearance. There is some degree of effacement of overlying vasculature. No acute osseous abnormalities are seen. There is no evidence of osseous erosion. The carpal rows appear grossly intact, and demonstrate  normal alignment. No elbow joint effusion is identified. IMPRESSION: Large 9.1 x 5.9 x 2.0 cm peripherally enhancing abscess at the ulnar aspect of the mid forearm, with overlying diffuse soft tissue edema involving much of the ulnar and dorsal forearm. The abscess arises within the musculature, and is mildly complex in appearance. Some degree of effacement of overlying musculature. Electronically Signed   By: JGarald BaldingM.D.   On: 06/26/2016 05:00   [place]al  Result Date: 07/01/2016 CLINICAL DATA:  Acute kidney injury with bilateral flank pain today. EXAM: RENAL / URINARY TRACT ULTRASOUND COMPLETE COMPARISON:  CT scan 08/15/2015. FINDINGS: Right Kidney: Length: 12.9 cm. Parenchyma appears higher in echogenicity compared to the liver. No hydronephrosis. Left Kidney: Length: 12.5 cm. Echogenicity within normal limits. No mass or hydronephrosis visualized. Bladder: Appears normal for degree of bladder distention. IMPRESSION: No evidence for hydronephrosis. Electronically Signed   By: EMisty StanleyM.D.   On: 07/01/2016 16:43    (Echo, Carotid, EGD, Colonoscopy, ERCP)    Subjective:   Discharge Exam: Vitals:   07/02/16 2022 07/03/16 0425  BP: 125/66 124/62  Pulse: (!) 54 (!) 56  Resp: 17 16  Temp: 98.5 F (36.9 C) 98.3 F (36.8 C)   Vitals:   07/01/16 2143 07/02/16 1300 07/02/16 2022 07/03/16 0425  BP: 121/70 126/79 125/66 124/62  Pulse: (!) 59  60 (!) 54 (!) 56  Resp: '17 17 17 16  '$ Temp: 98.8 F (37.1 C) 98.7 F (37.1 C) 98.5 F (36.9 C) 98.3 F (36.8 C)  TempSrc: Oral Oral Oral Oral  SpO2: 98% 100% 98% 100%  Weight:      Height:        General: Pt is alert, awake, not in acute distress Cardiovascular: RRR, S1/S2 +, no rubs, no gallops Respiratory: CTA bilaterally, no wheezing, no rhonchi Abdominal: Soft, NT, ND, bowel sounds + Extremities: no edema, no cyanosis    The results of significant diagnostics from this hospitalization (including imaging, microbiology,  ancillary and laboratory) are listed below for reference.     Microbiology: Recent Results (from the past 240 hour(s))  Culture, blood (routine x 2)     Status: None   Collection Time: 06/26/16  1:42 AM  Result Value Ref Range Status   Specimen Description LEFT ANTECUBITAL  Final   Special Requests BOTTLES DRAWN AEROBIC ONLY [place]  Final   Culture   Final    NO GROWTH 5 DAYS Performed at [facility]    Report Status 07/01/2016 FINAL  Final  Culture, blood (routine x 2)     Status: None   Collection Time: 06/26/16  1:42 AM  Result Value Ref Range Status   Specimen Description BLOOD LEFT ARM  Final   Special Requests BOTTLES DRAWN AEROBIC ONLY [place]  Final   Culture   Final    NO GROWTH 5 DAYS Performed at [facility]    Report Status 07/01/2016 FINAL  Final  Aerobic/Anaerobic Culture (surgical/deep wound)     Status: None   Collection Time: 06/26/16  2:34 PM  Result Value Ref Range Status   Specimen Description ABSCESS RIGHT FOREARM  Final   Special Requests NONE  Final   Gram Stain   Final    MODERATE WBC PRESENT,BOTH PMN AND MONONUCLEAR ABUNDANT GRAM POSITIVE COCCI IN PAIRS AND CHAINS MODERATE GRAM NEGATIVE RODS MODERATE GRAM POSITIVE COCCI IN CLUSTERS    Culture   Final    MODERATE STREPTOCOCCUS GROUP C MIXED ANAEROBIC FLORA PRESENT.  CALL LAB IF FURTHER IID REQUIRED.    Report Status 07/01/2016 FINAL  Final     Labs: BNP (last 3 results) No results for input(s): BNP in the last 8760 hours. Basic Metabolic Panel:  Recent Labs Lab 06/29/16 0527 06/30/16 0612 07/01/16 0439 07/02/16 0814 07/03/16 0547  NA  --  143 139 141 142  K  --  3.7 4.1 4.0 4.0  CL  --  107 106 108 110  CO2  --  '28 24 27 29  '$ GLUCOSE  --  96 79 122* 105*  BUN  --  '11 11 8 8  '$ CREATININE 1.31* 2.14* 2.20* 2.08* 2.03*  CALCIUM  --  8.1* 8.1* 8.2* 8.2*   Liver Function Tests: No results for input(s): AST, ALT, ALKPHOS, BILITOT, PROT, ALBUMIN in the last 168 hours. No  results for input(s): LIPASE, AMYLASE in the last 168 hours. No results for input(s): AMMONIA in the last 168 hours. CBC:  Recent Labs Lab 06/26/16 1932 06/27/16 0417 06/28/16 0349 06/29/16 0527  WBC 20.6* 16.9* 10.8* 8.4  HGB 11.3* 10.9* 10.7* 11.4*  HCT 34.5* 33.4* 33.0* 35.5*  MCV 84.4 84.1 85.3 85.3  PLT 322 330 381 440*   Cardiac Enzymes: No results for input(s): CKTOTAL, CKMB, CKMBINDEX, TROPONINI in the last 168 hours. BNP: Invalid input(s): POCBNP CBG:  Recent Labs Lab 06/30/16 1156  GLUCAP 123*   D-Dimer No results for input(s): DDIMER in the last 72 hours. Hgb A1c No results for input(s): HGBA1C in the last 72 hours. Lipid Profile No results for input(s): CHOL, HDL, LDLCALC, TRIG, CHOLHDL, LDLDIRECT in the last 72 hours. Thyroid function studies No results for input(s): TSH, T4TOTAL, T3FREE, THYROIDAB in the last 72 hours.  Invalid input(s): FREET3 Anemia work up No results for input(s): VITAMINB12, FOLATE, FERRITIN, TIBC, IRON, RETICCTPCT in the last 72 hours. Urinalysis    Component Value Date/Time   COLORURINE YELLOW 06/30/2016 1500   APPEARANCEUR CLEAR 06/30/2016 1500   LABSPEC <1.005 (L) 06/30/2016 1500   PHURINE 6.0 06/30/2016 1500   GLUCOSEU NEGATIVE 06/30/2016 1500   HGBUR SMALL (A) 06/30/2016 1500   BILIRUBINUR NEGATIVE 06/30/2016 1500   KETONESUR NEGATIVE 06/30/2016 1500   PROTEINUR NEGATIVE 06/30/2016 1500   UROBILINOGEN 1.0 08/04/2015 1935   NITRITE NEGATIVE 06/30/2016 1500   LEUKOCYTESUR TRACE (A) 06/30/2016 1500   Sepsis Labs Invalid input(s): PROCALCITONIN,  WBC,  LACTICIDVEN Microbiology Recent Results (from the past 240 hour(s))  Culture, blood (routine x 2)     Status: None   Collection Time: 06/26/16  1:42 AM  Result Value Ref Range Status   Specimen Description LEFT ANTECUBITAL  Final   Special Requests BOTTLES DRAWN AEROBIC ONLY [place]  Final   Culture   Final    NO GROWTH 5 DAYS Performed at [facility]    Report  Status 07/01/2016 FINAL  Final  Culture, blood (routine x 2)     Status: None   Collection Time: 06/26/16  1:42 AM  Result Value Ref Range Status   Specimen Description BLOOD LEFT ARM  Final   Special Requests BOTTLES DRAWN AEROBIC ONLY [place]  Final   Culture   Final    NO GROWTH 5 DAYS Performed at [facility]    Report Status 07/01/2016 FINAL  Final  Aerobic/Anaerobic Culture (surgical/deep wound)     Status: None   Collection Time: 06/26/16  2:34 PM  Result Value Ref Range Status   Specimen Description ABSCESS RIGHT FOREARM  Final   Special Requests NONE  Final   Gram Stain   Final    MODERATE WBC PRESENT,BOTH PMN AND MONONUCLEAR ABUNDANT GRAM POSITIVE COCCI IN PAIRS AND CHAINS MODERATE GRAM NEGATIVE RODS MODERATE GRAM POSITIVE COCCI IN CLUSTERS    Culture   Final    MODERATE STREPTOCOCCUS GROUP C MIXED ANAEROBIC FLORA PRESENT.  CALL LAB IF FURTHER IID REQUIRED.    Report Status 07/01/2016 FINAL  Final     Time coordinating discharge: Over 30 minutes  SIGNED:   Birdie Hopes, MD  [facility] 07/03/2016, 10:26 AM Pager   If 7PM-7AM, please contact night-coverage [email or web address] Password [email or web address]

## 2016-07-03 NOTE — Telephone Encounter (Signed)
The pt has been scheduled for Endo and Colon mother scheduled while in the building.  Pt is aware.

## 2016-07-13 ENCOUNTER — Telehealth: Payer: Self-pay | Admitting: Pharmacist

## 2016-07-13 NOTE — Telephone Encounter (Signed)
Have been trying to reach [facility] for several days to ask him to come in and see pharmacy for repeat BMET and to assess if we need to adjust his medications based on his kidney function.  Left VM with mother again.  Will hopefully call me back today.  Cassie L. Hinton Dyer, PharmD Infectious Diseases Clinical Pharmacist [facility] 07/13/2016, 11:03 AM

## 2016-07-23 ENCOUNTER — Ambulatory Visit: Payer: BLUE CROSS/BLUE SHIELD | Admitting: Internal Medicine

## 2016-08-05 ENCOUNTER — Ambulatory Visit: Payer: BLUE CROSS/BLUE SHIELD | Admitting: Internal Medicine

## 2016-08-11 ENCOUNTER — Encounter: Payer: BLUE CROSS/BLUE SHIELD | Admitting: Gastroenterology

## 2016-08-11 ENCOUNTER — Telehealth: Payer: Self-pay | Admitting: Gastroenterology

## 2016-08-11 NOTE — Telephone Encounter (Signed)
This is the second time he has no showed for a double procedure, unless he has a valid excuse he should be charged for a no show.

## 2016-10-26 ENCOUNTER — Other Ambulatory Visit: Payer: BLUE CROSS/BLUE SHIELD

## 2016-11-04 ENCOUNTER — Encounter (HOSPITAL_COMMUNITY): Payer: Self-pay | Admitting: *Deleted

## 2016-11-04 ENCOUNTER — Emergency Department (HOSPITAL_COMMUNITY): Payer: BLUE CROSS/BLUE SHIELD | Admitting: Anesthesiology

## 2016-11-04 ENCOUNTER — Inpatient Hospital Stay (HOSPITAL_COMMUNITY)
Admission: EM | Admit: 2016-11-04 | Discharge: 2016-11-09 | DRG: 981 | Disposition: A | Discharge status: Home / Self Care | Payer: BLUE CROSS/BLUE SHIELD | Attending: Internal Medicine | Admitting: Internal Medicine

## 2016-11-04 ENCOUNTER — Encounter (HOSPITAL_COMMUNITY)
Admission: EM | Disposition: A | Discharge status: Home / Self Care | Payer: Self-pay | Source: Home / Self Care | Attending: Internal Medicine

## 2016-11-04 DIAGNOSIS — Z9114 Patient's other noncompliance with medication regimen: Secondary | ICD-10-CM

## 2016-11-04 DIAGNOSIS — D649 Anemia, unspecified: Secondary | ICD-10-CM | POA: Diagnosis present

## 2016-11-04 DIAGNOSIS — L02413 Cutaneous abscess of right upper limb: Principal | ICD-10-CM | POA: Diagnosis present

## 2016-11-04 DIAGNOSIS — Z79899 Other long term (current) drug therapy: Secondary | ICD-10-CM

## 2016-11-04 DIAGNOSIS — K5900 Constipation, unspecified: Secondary | ICD-10-CM | POA: Diagnosis present

## 2016-11-04 DIAGNOSIS — B20 Human immunodeficiency virus [HIV] disease: Secondary | ICD-10-CM | POA: Diagnosis present

## 2016-11-04 DIAGNOSIS — Z888 Allergy status to other drugs, medicaments and biological substances status: Secondary | ICD-10-CM | POA: Diagnosis not present

## 2016-11-04 DIAGNOSIS — Z91041 Radiographic dye allergy status: Secondary | ICD-10-CM | POA: Diagnosis not present

## 2016-11-04 DIAGNOSIS — F1210 Cannabis abuse, uncomplicated: Secondary | ICD-10-CM | POA: Diagnosis present

## 2016-11-04 DIAGNOSIS — F909 Attention-deficit hyperactivity disorder, unspecified type: Secondary | ICD-10-CM | POA: Diagnosis present

## 2016-11-04 DIAGNOSIS — E876 Hypokalemia: Secondary | ICD-10-CM | POA: Diagnosis present

## 2016-11-04 DIAGNOSIS — Z885 Allergy status to narcotic agent status: Secondary | ICD-10-CM | POA: Diagnosis not present

## 2016-11-04 DIAGNOSIS — F17210 Nicotine dependence, cigarettes, uncomplicated: Secondary | ICD-10-CM | POA: Diagnosis present

## 2016-11-04 DIAGNOSIS — F1910 Other psychoactive substance abuse, uncomplicated: Secondary | ICD-10-CM | POA: Diagnosis present

## 2016-11-04 DIAGNOSIS — F329 Major depressive disorder, single episode, unspecified: Secondary | ICD-10-CM | POA: Diagnosis present

## 2016-11-04 DIAGNOSIS — M79631 Pain in right forearm: Secondary | ICD-10-CM | POA: Diagnosis not present

## 2016-11-04 DIAGNOSIS — F419 Anxiety disorder, unspecified: Secondary | ICD-10-CM | POA: Diagnosis present

## 2016-11-04 DIAGNOSIS — T148XXA Other injury of unspecified body region, initial encounter: Secondary | ICD-10-CM

## 2016-11-04 DIAGNOSIS — L089 Local infection of the skin and subcutaneous tissue, unspecified: Secondary | ICD-10-CM | POA: Diagnosis present

## 2016-11-04 DIAGNOSIS — Z21 Asymptomatic human immunodeficiency virus [HIV] infection status: Secondary | ICD-10-CM | POA: Diagnosis present

## 2016-11-04 HISTORY — DX: Migraine, unspecified, not intractable, without status migrainosus: G43.909

## 2016-11-04 HISTORY — DX: Bipolar disorder, unspecified: F31.9

## 2016-11-04 HISTORY — DX: Unspecified chronic bronchitis: J42

## 2016-11-04 HISTORY — PX: I & D EXTREMITY: SHX5045

## 2016-11-04 HISTORY — DX: Headache, unspecified: R51.9

## 2016-11-04 HISTORY — DX: Essential (primary) hypertension: I10

## 2016-11-04 HISTORY — DX: Headache: R51

## 2016-11-04 LAB — CBC WITH DIFFERENTIAL/PLATELET
BASOS ABS: 0 10*3/uL (ref 0.0–0.1)
Basophils Relative: 0 %
Eosinophils Absolute: 0 10*3/uL (ref 0.0–0.7)
Eosinophils Relative: 0 %
HEMATOCRIT: 43.9 % (ref 39.0–52.0)
HEMOGLOBIN: 15.2 g/dL (ref 13.0–17.0)
LYMPHS PCT: 13 %
Lymphs Abs: 2.5 10*3/uL (ref 0.7–4.0)
MCH: 28.8 pg (ref 26.0–34.0)
MCHC: 34.6 g/dL (ref 30.0–36.0)
MCV: 83.3 fL (ref 78.0–100.0)
MONOS PCT: 7 %
Monocytes Absolute: 1.4 10*3/uL — ABNORMAL HIGH (ref 0.1–1.0)
Neutro Abs: 15.7 10*3/uL — ABNORMAL HIGH (ref 1.7–7.7)
Neutrophils Relative %: 80 %
Platelets: 297 10*3/uL (ref 150–400)
RBC: 5.27 MIL/uL (ref 4.22–5.81)
RDW: 13.3 % (ref 11.5–15.5)
WBC: 19.6 10*3/uL — AB (ref 4.0–10.5)

## 2016-11-04 LAB — COMPREHENSIVE METABOLIC PANEL
ALT: 59 U/L (ref 17–63)
ANION GAP: 9 (ref 5–15)
AST: 45 U/L — ABNORMAL HIGH (ref 15–41)
Albumin: 3.6 g/dL (ref 3.5–5.0)
Alkaline Phosphatase: 60 U/L (ref 38–126)
BILIRUBIN TOTAL: 0.8 mg/dL (ref 0.3–1.2)
BUN: 11 mg/dL (ref 6–20)
CO2: 23 mmol/L (ref 22–32)
Calcium: 8.6 mg/dL — ABNORMAL LOW (ref 8.9–10.3)
Chloride: 103 mmol/L (ref 101–111)
Creatinine, Ser: 1.19 mg/dL (ref 0.61–1.24)
Glucose, Bld: 120 mg/dL — ABNORMAL HIGH (ref 65–99)
POTASSIUM: 3.4 mmol/L — AB (ref 3.5–5.1)
Sodium: 135 mmol/L (ref 135–145)
TOTAL PROTEIN: 8.6 g/dL — AB (ref 6.5–8.1)

## 2016-11-04 SURGERY — IRRIGATION AND DEBRIDEMENT EXTREMITY
Anesthesia: General | Laterality: Right

## 2016-11-04 MED ORDER — FENTANYL CITRATE (PF) 100 MCG/2ML IJ SOLN
INTRAMUSCULAR | Status: AC
  Filled 2016-11-04: qty 4

## 2016-11-04 MED ORDER — PROPOFOL 10 MG/ML IV BOLUS
INTRAVENOUS | Status: AC
  Filled 2016-11-04: qty 20

## 2016-11-04 MED ORDER — ONDANSETRON HCL 4 MG/2ML IJ SOLN
4.0000 mg | Freq: Once | INTRAMUSCULAR | Status: AC
  Administered 2016-11-04: 4 mg via INTRAVENOUS
  Filled 2016-11-04: qty 2

## 2016-11-04 MED ORDER — SODIUM CHLORIDE 0.9 % IR SOLN
Status: DC | PRN
  Administered 2016-11-04: 3000 mL
  Administered 2016-11-04: 1000 mL

## 2016-11-04 MED ORDER — AMOXICILLIN 500 MG PO CAPS
500.0000 mg | ORAL_CAPSULE | Freq: Three times a day (TID) | ORAL | Status: DC
  Filled 2016-11-04 (×2): qty 1

## 2016-11-04 MED ORDER — FENTANYL CITRATE (PF) 100 MCG/2ML IJ SOLN
INTRAMUSCULAR | Status: AC
  Filled 2016-11-04: qty 2

## 2016-11-04 MED ORDER — CLINDAMYCIN PHOSPHATE 900 MG/50ML IV SOLN
900.0000 mg | Freq: Once | INTRAVENOUS | Status: AC
  Administered 2016-11-04: 900 mg via INTRAVENOUS
  Filled 2016-11-04: qty 50

## 2016-11-04 MED ORDER — VANCOMYCIN HCL IN DEXTROSE 1-5 GM/200ML-% IV SOLN
1000.0000 mg | Freq: Once | INTRAVENOUS | Status: DC
  Filled 2016-11-04: qty 200

## 2016-11-04 MED ORDER — ABACAVIR-DOLUTEGRAVIR-LAMIVUD 600-50-300 MG PO TABS
1.0000 | ORAL_TABLET | Freq: Every day | ORAL | Status: DC
  Administered 2016-11-05 – 2016-11-09 (×5): 1 via ORAL
  Filled 2016-11-04 (×5): qty 1

## 2016-11-04 MED ORDER — DESIPRAMINE HCL 25 MG PO TABS: 25.0000 mg | ORAL_TABLET | Freq: Every day | ORAL | Status: DC

## 2016-11-04 MED ORDER — ONDANSETRON HCL 4 MG PO TABS: 4.0000 mg | ORAL_TABLET | ORAL | Status: DC | PRN

## 2016-11-04 MED ORDER — FENTANYL CITRATE (PF) 100 MCG/2ML IJ SOLN
75.0000 ug | Freq: Once | INTRAMUSCULAR | Status: AC
Start: 2016-11-04 — End: 2016-11-04
  Administered 2016-11-04: 75 ug via INTRAVENOUS
  Filled 2016-11-04: qty 2

## 2016-11-04 MED ORDER — ONDANSETRON HCL 4 MG/2ML IJ SOLN
4.0000 mg | Freq: Four times a day (QID) | INTRAMUSCULAR | Status: DC | PRN
  Administered 2016-11-05 – 2016-11-09 (×5): 4 mg via INTRAVENOUS
  Filled 2016-11-04 (×5): qty 2

## 2016-11-04 MED ORDER — OXYCODONE HCL 5 MG/5ML PO SOLN: 5.0000 mg | Freq: Once | ORAL | Status: DC | PRN

## 2016-11-04 MED ORDER — PROMETHAZINE HCL 25 MG/ML IJ SOLN: 6.2500 mg | INTRAMUSCULAR | Status: DC | PRN

## 2016-11-04 MED ORDER — MEPERIDINE HCL 25 MG/ML IJ SOLN: 6.2500 mg | INTRAMUSCULAR | Status: DC | PRN

## 2016-11-04 MED ORDER — BUPIVACAINE HCL (PF) 0.25 % IJ SOLN
INTRAMUSCULAR | Status: AC
  Filled 2016-11-04: qty 10

## 2016-11-04 MED ORDER — OXYCODONE HCL 5 MG PO TABS: 5.0000 mg | ORAL_TABLET | Freq: Once | ORAL | Status: DC | PRN

## 2016-11-04 MED ORDER — LIDOCAINE HCL (CARDIAC) 20 MG/ML IV SOLN
INTRAVENOUS | Status: DC | PRN
Start: 2016-11-04 — End: 2016-11-06
  Administered 2016-11-04: 100 mg via INTRAVENOUS

## 2016-11-04 MED ORDER — MIDAZOLAM HCL 2 MG/2ML IJ SOLN
INTRAMUSCULAR | Status: AC
  Filled 2016-11-04: qty 2

## 2016-11-04 MED ORDER — FENTANYL CITRATE (PF) 100 MCG/2ML IJ SOLN
25.0000 ug | INTRAMUSCULAR | Status: DC | PRN
  Administered 2016-11-04 (×2): 50 ug via INTRAVENOUS

## 2016-11-04 MED ORDER — FENTANYL CITRATE (PF) 100 MCG/2ML IJ SOLN: 100.0000 ug | Freq: Once | INTRAMUSCULAR | Status: DC

## 2016-11-04 MED ORDER — VANCOMYCIN HCL IN DEXTROSE 1-5 GM/200ML-% IV SOLN
1000.0000 mg | Freq: Three times a day (TID) | INTRAVENOUS | Status: DC
  Administered 2016-11-04 – 2016-11-08 (×11): 1000 mg via INTRAVENOUS
  Filled 2016-11-04 (×12): qty 200

## 2016-11-04 MED ORDER — ONDANSETRON HCL 4 MG PO TABS: 4.0000 mg | ORAL_TABLET | Freq: Four times a day (QID) | ORAL | Status: DC | PRN

## 2016-11-04 MED ORDER — SODIUM CHLORIDE 0.9 % IV BOLUS (SEPSIS)
1000.0000 mL | Freq: Once | INTRAVENOUS | Status: AC
  Administered 2016-11-04: 1000 mL via INTRAVENOUS

## 2016-11-04 MED ORDER — LACTATED RINGERS IV SOLN
INTRAVENOUS | Status: DC | PRN
  Administered 2016-11-04 (×2): via INTRAVENOUS

## 2016-11-04 MED ORDER — BUPIVACAINE HCL (PF) 0.25 % IJ SOLN
INTRAMUSCULAR | Status: DC | PRN
  Administered 2016-11-04: 10 mL

## 2016-11-04 MED ORDER — SODIUM CHLORIDE 0.9 % IV SOLN
INTRAVENOUS | Status: AC
  Administered 2016-11-04 – 2016-11-05 (×2): via INTRAVENOUS

## 2016-11-04 MED ORDER — PROPOFOL 10 MG/ML IV BOLUS
INTRAVENOUS | Status: DC | PRN
  Administered 2016-11-04: 200 mg via INTRAVENOUS

## 2016-11-04 MED ORDER — FENTANYL CITRATE (PF) 100 MCG/2ML IJ SOLN
INTRAMUSCULAR | Status: DC | PRN
  Administered 2016-11-04 (×2): 50 ug via INTRAVENOUS

## 2016-11-04 MED ORDER — KETOROLAC TROMETHAMINE 15 MG/ML IJ SOLN
15.0000 mg | Freq: Four times a day (QID) | INTRAMUSCULAR | Status: DC | PRN
  Administered 2016-11-04 – 2016-11-06 (×4): 15 mg via INTRAVENOUS
  Filled 2016-11-04 (×4): qty 1

## 2016-11-04 SURGICAL SUPPLY — 55 items
BANDAGE ACE 4X5 VEL STRL LF (GAUZE/BANDAGES/DRESSINGS) ×3 IMPLANT
BANDAGE ELASTIC 3 VELCRO ST LF (GAUZE/BANDAGES/DRESSINGS) ×1 IMPLANT
BNDG CMPR 9X4 STRL LF SNTH (GAUZE/BANDAGES/DRESSINGS)
BNDG CONFORM 2 STRL LF (GAUZE/BANDAGES/DRESSINGS) IMPLANT
BNDG ESMARK 4X9 LF (GAUZE/BANDAGES/DRESSINGS) IMPLANT
BNDG GAUZE ELAST 4 BULKY (GAUZE/BANDAGES/DRESSINGS) ×3 IMPLANT
CORDS BIPOLAR (ELECTRODE) ×1 IMPLANT
COVER SURGICAL LIGHT HANDLE (MISCELLANEOUS) ×3 IMPLANT
CUFF TOURNIQUET SINGLE 18IN (TOURNIQUET CUFF) ×2 IMPLANT
DECANTER SPIKE VIAL GLASS SM (MISCELLANEOUS) ×2 IMPLANT
DRAIN PENROSE 1/4X12 LTX STRL (WOUND CARE) IMPLANT
DRAPE SURG 17X23 STRL (DRAPES) ×2 IMPLANT
DRSG PAD ABDOMINAL 8X10 ST (GAUZE/BANDAGES/DRESSINGS) ×3 IMPLANT
DURAPREP 26ML APPLICATOR (WOUND CARE) ×1 IMPLANT
ELECT REM PT RETURN 9FT ADLT (ELECTROSURGICAL)
ELECTRODE REM PT RTRN 9FT ADLT (ELECTROSURGICAL) IMPLANT
GAUZE PACKING IODOFORM 1/4X15 (GAUZE/BANDAGES/DRESSINGS) ×1 IMPLANT
GAUZE SPONGE 4X4 12PLY STRL (GAUZE/BANDAGES/DRESSINGS) ×3 IMPLANT
GAUZE XEROFORM 1X8 LF (GAUZE/BANDAGES/DRESSINGS) ×2 IMPLANT
GLOVE SURG SYN 8.0 (GLOVE) ×2 IMPLANT
GLOVE SURG SYN 8.0 PF PI (GLOVE) ×1 IMPLANT
GOWN STRL REUS W/ TWL LRG LVL3 (GOWN DISPOSABLE) ×1 IMPLANT
GOWN STRL REUS W/ TWL XL LVL3 (GOWN DISPOSABLE) ×1 IMPLANT
GOWN STRL REUS W/TWL LRG LVL3 (GOWN DISPOSABLE) ×2
GOWN STRL REUS W/TWL XL LVL3 (GOWN DISPOSABLE) ×2
HANDPIECE INTERPULSE COAX TIP (DISPOSABLE)
KIT BASIN OR (CUSTOM PROCEDURE TRAY) ×2 IMPLANT
KIT ROOM TURNOVER OR (KITS) ×2 IMPLANT
MANIFOLD NEPTUNE II (INSTRUMENTS) ×2 IMPLANT
NDL HYPO 25GX1X1/2 BEV (NEEDLE) IMPLANT
NDL HYPO 25X1 1.5 SAFETY (NEEDLE) ×1 IMPLANT
NEEDLE HYPO 25GX1X1/2 BEV (NEEDLE) IMPLANT
NEEDLE HYPO 25X1 1.5 SAFETY (NEEDLE) IMPLANT
NS IRRIG 1000ML POUR BTL (IV SOLUTION) ×2 IMPLANT
PACK ORTHO EXTREMITY (CUSTOM PROCEDURE TRAY) ×2 IMPLANT
PAD ARMBOARD 7.5X6 YLW CONV (MISCELLANEOUS) ×4 IMPLANT
PAD CAST 4YDX4 CTTN HI CHSV (CAST SUPPLIES) ×2 IMPLANT
PADDING CAST COTTON 4X4 STRL (CAST SUPPLIES) ×2
SET HNDPC FAN SPRY TIP SCT (DISPOSABLE) IMPLANT
SPONGE LAP 18X18 X RAY DECT (DISPOSABLE) ×2 IMPLANT
SUCTION FRAZIER HANDLE 10FR (MISCELLANEOUS) ×1
SUCTION TUBE FRAZIER 10FR DISP (MISCELLANEOUS) ×1 IMPLANT
SUT ETHILON 3 0 PS 1 (SUTURE) ×1 IMPLANT
SUT SILK 2 0 (SUTURE) ×2
SUT SILK 2-0 18XBRD TIE 12 (SUTURE) IMPLANT
SUT VICRYL RAPIDE 4/0 PS 2 (SUTURE) IMPLANT
SWAB COLLECTION DEVICE MRSA (MISCELLANEOUS) ×1 IMPLANT
SYR 20CC LL (SYRINGE) IMPLANT
SYR CONTROL 10ML LL (SYRINGE) ×2 IMPLANT
TOWEL OR 17X24 6PK STRL BLUE (TOWEL DISPOSABLE) ×2 IMPLANT
TOWEL OR 17X26 10 PK STRL BLUE (TOWEL DISPOSABLE) ×2 IMPLANT
TUBE ANAEROBIC SPECIMEN COL (MISCELLANEOUS) ×1 IMPLANT
TUBE CONNECTING 12X1/4 (SUCTIONS) ×2 IMPLANT
UNDERPAD 30X30 (UNDERPADS AND DIAPERS) ×2 IMPLANT
YANKAUER SUCT BULB TIP NO VENT (SUCTIONS) ×2 IMPLANT

## 2016-11-04 NOTE — Progress Notes (Signed)
Patient underwent incision and drainage of proximal right forearm deep abscess. Patient's wound was packed open with iodoform gauze and loosely approximated. Patient will need repeat incision and drainage on Friday, 11/06/2016.

## 2016-11-04 NOTE — Consult Note (Signed)
Reason for Consult: Right forearm abscess Referring Physician: cardama  Pedro Owens is an 26 y.o. male.  HPI: As above with a history of intravenous drug abuse with abscesses in the past who presents today with a swollen and tense right proximal forearm with evidence of a deep abscess.  Past Medical History:  Diagnosis Date  . ADHD (attention deficit hyperactivity disorder)   . Anal warts   . Anxiety   . Chronic pain syndrome   . Convulsions (HCC) 08/14/2015  . Depression   . Gonorrhea 09/08/11  . Hemorrhoids, internal, with bleeding 08/25/2011  . HIV infection (HCC)   . Ileitis   . ILEITIS 09/24/2010   Qualifier: Diagnosis of  By: Nelson-Smith CMA (AAMA), Dottie    . Lymphoid hyperplasia, reactive   . Marijuana abuse   . Pseudoseizures   . Seizures (HCC)     Past Surgical History:  Procedure Laterality Date  . ANKLE SURGERY Bilateral 2005   Screw & Pins  . FOOT SURGERY Bilateral 2005   Screw & Pins  . I&D EXTREMITY Right 06/26/2016   Procedure: IRRIGATION AND DEBRIDEMENT RIGHT FOREARM;  Surgeon: Kevin Kuzma, MD;  Location: [facility] OR;  Service: Orthopedics;  Laterality: Right;    Family History  Problem Relation Age of Onset  . Diabetes Mother   . Irritable bowel syndrome Mother   . Hypertension Mother   . Hyperlipidemia Mother   . Hypothyroidism Mother   . Diabetes Maternal Uncle   . Heart disease Maternal Grandmother   . Leukemia Other     maternal greatgrandmother  . Colon cancer Neg Hx     Social History:  reports that he has been smoking Cigarettes.  He has a 8.00 pack-year smoking history. He has never used smokeless tobacco. He reports that he drinks about 0.6 oz of alcohol per week . He reports that he uses drugs, including Cocaine, Benzodiazepines, and Oxycodone, about 2 times per week.  Allergies:  Allergies  Allergen Reactions  . Acetaminophen Diarrhea    Flares up crohns disease  . Other     IV CONTRAST  . Vicodin [Hydrocodone-Acetaminophen] Nausea  And Vomiting  . Dilaudid [Hydromorphone Hcl] Hives and Rash    Medications:  Scheduled: . fentaNYL (SUBLIMAZE) injection  100 mcg Intravenous Once    Results for orders placed or performed during the hospital encounter of 11/04/16 (from the past 48 hour(s))  Comprehensive metabolic panel     Status: Abnormal   Collection Time: 11/04/16  3:00 PM  Result Value Ref Range   Sodium 135 135 - 145 mmol/L   Potassium 3.4 (L) 3.5 - 5.1 mmol/L   Chloride 103 101 - 111 mmol/L   CO2 23 22 - 32 mmol/L   Glucose, Bld 120 (H) 65 - 99 mg/dL   BUN 11 6 - 20 mg/dL   Creatinine, Ser 1.19 0.61 - 1.24 mg/dL   Calcium 8.6 (L) 8.9 - 10.3 mg/dL   Total Protein 8.6 (H) 6.5 - 8.1 g/dL   Albumin 3.6 3.5 - 5.0 g/dL   AST 45 (H) 15 - 41 U/L   ALT 59 17 - 63 U/L   Alkaline Phosphatase 60 38 - 126 U/L   Total Bilirubin 0.8 0.3 - 1.2 mg/dL   GFR calc non Af Amer >60 >60 mL/min   GFR calc Af Amer >60 >60 mL/min    Comment: (NOTE) The eGFR has been calculated using the CKD EPI equation. This calculation has not been validated in all clinical situations.   eGFR's persistently <60 mL/min signify possible Chronic Kidney Disease.    Anion gap 9 5 - 15  CBC with Differential     Status: Abnormal   Collection Time: 11/04/16  3:00 PM  Result Value Ref Range   WBC 19.6 (H) 4.0 - 10.5 K/uL   RBC 5.27 4.22 - 5.81 MIL/uL   Hemoglobin 15.2 13.0 - 17.0 g/dL   HCT 43.9 39.0 - 52.0 %   MCV 83.3 78.0 - 100.0 fL   MCH 28.8 26.0 - 34.0 pg   MCHC 34.6 30.0 - 36.0 g/dL   RDW 13.3 11.5 - 15.5 %   Platelets 297 150 - 400 K/uL   Neutrophils Relative % 80 %   Lymphocytes Relative 13 %   Monocytes Relative 7 %   Eosinophils Relative 0 %   Basophils Relative 0 %   Neutro Abs 15.7 (H) 1.7 - 7.7 K/uL   Lymphs Abs 2.5 0.7 - 4.0 K/uL   Monocytes Absolute 1.4 (H) 0.1 - 1.0 K/uL   Eosinophils Absolute 0.0 0.0 - 0.7 K/uL   Basophils Absolute 0.0 0.0 - 0.1 K/uL   WBC Morphology ATYPICAL LYMPHOCYTES     No results  found.  Review of Systems  All other systems reviewed and are negative.  Blood pressure 127/77, pulse 79, temperature 100.1 F (37.8 C), temperature source Oral, resp. rate 19, height 5' 11" (1.803 m), weight 76.2 kg (168 lb), SpO2 99 %. Physical Exam  Constitutional: He is oriented to person, place, and time. He appears well-developed and well-nourished.  HENT:  Head: Normocephalic and atraumatic.  Neck: Normal range of motion.  Cardiovascular: Normal rate.   Respiratory: Effort normal.  Musculoskeletal:       Right forearm: He exhibits tenderness, swelling and edema.  Right proximal forearm swelling, tenderness, and erythema.  Neurological: He is alert and oriented to person, place, and time.  Skin: Skin is warm. There is erythema.  Psychiatric: He has a normal mood and affect. His behavior is normal. Judgment and thought content normal.    Assessment/Plan: As above. Plan to admit to medical service and take patient to the operating room for incision and drainage of above abscess. I discussed with the patient the need for multiple incision and drainages if necessary.  Kinze Labo A 11/04/2016, 5:55 PM

## 2016-11-04 NOTE — Anesthesia Procedure Notes (Signed)
Procedure Name: LMA Insertion Date/Time: 11/04/2016 6:52 PM Performed by: Gwenyth Allegra Pre-anesthesia Checklist: Patient identified, Emergency Drugs available, Suction available, Patient being monitored and Timeout performed Patient Re-evaluated:Patient Re-evaluated prior to inductionOxygen Delivery Method: Circle system utilized Preoxygenation: Pre-oxygenation with 100% oxygen Intubation Type: IV induction LMA: LMA inserted LMA Size: 4.0 Number of attempts: 1 Placement Confirmation: positive ETCO2 and breath sounds checked- equal and bilateral Dental Injury: Teeth and Oropharynx as per pre-operative assessment

## 2016-11-04 NOTE — Op Note (Signed)
See dictated note [phone]

## 2016-11-04 NOTE — H&P (Signed)
History and Physical    Pedro Owens BWG:[id number] DOB: 1991-09-10 DOA: 11/04/2016  PCP: Vonna Drafts, FNP  Patient coming from: Home.  Chief Complaint: Right forearm pain and swelling.  HPI: Pedro Owens is a 26 y.o. male with history of HIV, IV drug abuse presents to the ER because of right forearm pain and swelling. By the time I examined the patient, patient is already has had incision and drainage in the OR. Patient's right forearm was already dressed. Patient states over the last 1 week, patient has been having progressively worsening pain and swelling of the right forearm. Also has been having sensation of fever and chills. Admits to have injected IV drugs on the forearm.  ED Course: Patient was taken to the or for incision and drainage. Blood work showed leukocytosis.  Review of Systems: As per HPI, rest all negative.   Past Medical History:  Diagnosis Date  . ADHD (attention deficit hyperactivity disorder)   . Anal warts   . Anxiety   . Chronic pain syndrome   . Convulsions ([place]) 08/14/2015  . Depression   . Gonorrhea 09/08/11  . Hemorrhoids, internal, with bleeding 08/25/2011  . HIV infection ([place])   . Ileitis   . ILEITIS 09/24/2010   Qualifier: Diagnosis of  By: Nelson-Smith CMA (AAMA), Dottie    . Lymphoid hyperplasia, reactive   . Marijuana abuse   . Pseudoseizures   . Seizures ([place])     Past Surgical History:  Procedure Laterality Date  . ANKLE SURGERY Bilateral 2005   Screw & Pins  . FOOT SURGERY Bilateral 2005   Screw & Pins  . I&D EXTREMITY Right 06/26/2016   Procedure: IRRIGATION AND DEBRIDEMENT RIGHT FOREARM;  Surgeon: Leanora Cover, MD;  Location: [facility];  Service: Orthopedics;  Laterality: Right;     reports that he has been smoking Cigarettes.  He has a 8.00 pack-year smoking history. He has never used smokeless tobacco. He reports that he drinks about 0.6 oz of alcohol per week . He reports that he uses drugs, including Cocaine,  Benzodiazepines, and Oxycodone, about 2 times per week.  Allergies  Allergen Reactions  . Acetaminophen Diarrhea    Flares up crohns disease  . Other     IV CONTRAST  . Vicodin [Hydrocodone-Acetaminophen] Nausea And Vomiting  . Dilaudid [Hydromorphone Hcl] Hives and Rash    Family History  Problem Relation Age of Onset  . Diabetes Mother   . Irritable bowel syndrome Mother   . Hypertension Mother   . Hyperlipidemia Mother   . Hypothyroidism Mother   . Diabetes Maternal Uncle   . Heart disease Maternal Grandmother   . Leukemia Other     maternal greatgrandmother  . Colon cancer Neg Hx     Prior to Admission medications   Medication Sig Start Date End Date Taking? Authorizing Provider  abacavir-dolutegravir-lamiVUDine (TRIUMEQ) 600-50-300 MG tablet Take 1 tablet by mouth daily. 04/16/16  Yes Michel Bickers, MD  amoxicillin (AMOXIL) 500 MG capsule Take 1 capsule (500 mg total) by mouth 3 (three) times daily. Patient not taking: Reported on 11/04/2016 07/03/16   Verlee Monte, MD  desipramine (NORPRAMIN) 25 MG tablet Take 1 tablet (25 mg total) by mouth at bedtime. Patient not taking: Reported on 11/04/2016 05/06/16   Manus Gunning, MD  Na Sulfate-K Sulfate-Mg Sulf [facility] BOWEL PREP KIT) 17.5-3.13-1.6 GM/180ML SOLN Use as directed Patient not taking: Reported on 11/04/2016 05/06/16   Manus Gunning, MD  ondansetron [facility]) 4  MG tablet Take 1 tablet (4 mg total) by mouth every 4 (four) hours as needed for nausea. Patient not taking: Reported on 11/04/2016 07/03/16   Verlee Monte, MD    Physical Exam: Vitals:   11/04/16 1942 11/04/16 2000 11/04/16 2010 11/04/16 2020  BP: 131/87 140/83 135/81 117/79  Pulse:  81 83 76  Resp: _0 Temp: 97.6 F (36.4 C)   97.6 F (36.4 C)  TempSrc:      SpO2: 100% 100% 99% 99%  Weight:      Height:          Constitutional: Moderately built and nourished. Vitals:   11/04/16 1942 11/04/16 2000 11/04/16 2010 11/04/16 2020   BP: 131/87 140/83 135/81 117/79  Pulse:  81 83 76  Resp: _1 Temp: 97.6 F (36.4 C)   97.6 F (36.4 C)  TempSrc:      SpO2: 100% 100% 99% 99%  Weight:      Height:       Eyes: Anicteric no pallor. ENMT: No discharge from the ears eyes nose or mouth. Neck: No mass felt. No neck rigidity. Respiratory: No rhonchi or crepitations. Cardiovascular: S1-S2 heard no murmurs appreciated. Abdomen: Soft nontender bowel sounds present. No guarding or rigidity. Musculoskeletal: Right forearm and dressing. Skin: Right forearm under dressing. Neurologic: Alert awake oriented to time place and person. Moves all extremities. Psychiatric: Appears normal. Normal affect.   Labs on Admission: I have personally reviewed following labs and imaging studies  CBC:  Recent Labs Lab 11/04/16 1500  WBC 19.6*  NEUTROABS 15.7*  HGB 15.2  HCT 43.9  MCV 83.3  PLT 202   Basic Metabolic Panel:  Recent Labs Lab 11/04/16 1500  NA 135  K 3.4*  CL 103  CO2 23  GLUCOSE 120*  BUN 11  CREATININE 1.19  CALCIUM 8.6*   GFR: Estimated Creatinine Clearance: 101.9 mL/min (by C-G formula based on SCr of 1.19 mg/dL). Liver Function Tests:  Recent Labs Lab 11/04/16 1500  AST 45*  ALT 59  ALKPHOS 60  BILITOT 0.8  PROT 8.6*  ALBUMIN 3.6   No results for input(s): LIPASE, AMYLASE in the last 168 hours. No results for input(s): AMMONIA in the last 168 hours. Coagulation Profile: No results for input(s): INR, PROTIME in the last 168 hours. Cardiac Enzymes: No results for input(s): CKTOTAL, CKMB, CKMBINDEX, TROPONINI in the last 168 hours. BNP (last 3 results) No results for input(s): PROBNP in the last 8760 hours. HbA1C: No results for input(s): HGBA1C in the last 72 hours. CBG: No results for input(s): GLUCAP in the last 168 hours. Lipid Profile: No results for input(s): CHOL, HDL, LDLCALC, TRIG, CHOLHDL, LDLDIRECT in the last 72 hours. Thyroid Function Tests: No results for  input(s): TSH, T4TOTAL, FREET4, T3FREE, THYROIDAB in the last 72 hours. Anemia Panel: No results for input(s): VITAMINB12, FOLATE, FERRITIN, TIBC, IRON, RETICCTPCT in the last 72 hours. Urine analysis:    Component Value Date/Time   COLORURINE YELLOW 06/30/2016 1500   APPEARANCEUR CLEAR 06/30/2016 1500   LABSPEC <1.005 (L) 06/30/2016 1500   PHURINE 6.0 06/30/2016 1500   GLUCOSEU NEGATIVE 06/30/2016 1500   HGBUR SMALL (A) 06/30/2016 1500   BILIRUBINUR NEGATIVE 06/30/2016 1500   KETONESUR NEGATIVE 06/30/2016 1500   PROTEINUR NEGATIVE 06/30/2016 1500   UROBILINOGEN 1.0 08/04/2015 1935   NITRITE NEGATIVE 06/30/2016 1500   LEUKOCYTESUR TRACE (A) 06/30/2016 1500   Sepsis Labs: _2 (procalcitonin:4,lacticidven:4) )No results found for this or any  previous visit (from the past 240 hour(s)).   Radiological Exams on Admission: No results found.   Assessment/Plan Principal Problem:   Abscess of forearm, right Active Problems:   HIV disease (HCC)   Polysubstance abuse   Wound infection   Abscess of right forearm    1. Right forearm abscess status post incision and drainage - follow wound cultures blood cultures. Patient is placed empirically on vancomycin and Zosyn until culture results are available. Appreciated hand surgery consult. Planning to take for further debridement on Friday, 11/06/2016 as per the notes. 2. IV drug abuse - patient advised to quit the habit. We'll get social work consult. Follow blood culture. 3. HIV - last CD4 count in the system was around 790 ON 05/2016. Continue antiretroviral.   DVT prophylaxis: SCDs. Code Status: Full code.  Family Communication: Discussed with patient.  Disposition Plan: Home.  Consults called: Hand surgery.  Admission status: Inpatient.    Rise Patience MD [facility] Pager [phone].  If 7PM-7AM, please contact night-coverage [email or web address] Password [facility]  11/04/2016, 9:19 PM

## 2016-11-04 NOTE — ED Triage Notes (Addendum)
Pt states abscess and swelling to R arm x 1 week.  States surgery to same arm 3 months ago for an infection.  Pt also feels nauseated.

## 2016-11-04 NOTE — Anesthesia Preprocedure Evaluation (Signed)
Anesthesia Evaluation    Airway       Dental   Pulmonary Current Smoker,          Cardiovascular     Neuro/Psych    GI/Hepatic   Endo/Other    Renal/GU      Musculoskeletal   Abdominal   Peds  Hematology   Anesthesia Other Findings   Reproductive/Obstetrics                           Anesthesia Physical Anesthesia Plan Anesthesia Quick Evaluation  

## 2016-11-04 NOTE — ED Provider Notes (Signed)
[facility] DEPT Provider Note   CSN: [id number] Arrival date & time: 11/04/16  1425     History   Chief Complaint Chief Complaint  Patient presents with  . Arm Pain    HPI Pedro Owens is a 26 y.o. male.  The history is provided by the patient.  Arm Pain  This is a recurrent problem. The current episode started more than 1 week ago. The problem occurs constantly. The problem has been gradually worsening. Pertinent negatives include no chest pain, no abdominal pain, no headaches and no shortness of breath. Exacerbated by: palpation. Nothing relieves the symptoms. He has tried nothing for the symptoms. The treatment provided no relief.   Pt had RUE cellulitis last month with underlying abscess requiring I&D. States that he saw a small abscess/pimple in the area last week. He tried to "pop it" and since his forearm has swollen and become tender, extending to elbow.  Pt with h/o HIV noncompliant with HAART medication. Admits to IVDU (heroin) approx 2 weeks ago.   Past Medical History:  Diagnosis Date  . ADHD (attention deficit hyperactivity disorder)   . Anal warts   . Anxiety   . Chronic pain syndrome   . Convulsions ([place]) 08/14/2015  . Depression   . Gonorrhea 09/08/11  . Hemorrhoids, internal, with bleeding 08/25/2011  . HIV infection ([place])   . Ileitis   . ILEITIS 09/24/2010   Qualifier: Diagnosis of  By: Nelson-Smith CMA (AAMA), Dottie    . Lymphoid hyperplasia, reactive   . Marijuana abuse   . Pseudoseizures   . Seizures [facility])     Patient Active Problem List   Diagnosis Date Noted  . Streptococcus infection, group C 06/30/2016  . Back pain 06/30/2016  . AKI (acute kidney injury) ([place]) 06/29/2016  . Postoperative anemia due to acute blood loss 06/28/2016  . Cellulitis of right forearm 06/26/2016  . Right forearm pain   . HIV disease ([place]) 04/07/2016  . Cigarette smoker 04/07/2016  . Polysubstance abuse 04/07/2016  . Depression 08/14/2015  . Conversion  disorder 08/14/2015  . Hemorrhoids, internal, with bleeding 08/25/2011  . Anal warts 08/25/2011  . ANXIETY 09/24/2010  . ADHD 09/24/2010  . Constipation 09/24/2010  . 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 ANKLE SURGERY Bilateral 2005   Screw & Pins  . FOOT SURGERY Bilateral 2005   Screw & Pins  . I&D EXTREMITY Right 06/26/2016   Procedure: IRRIGATION AND DEBRIDEMENT RIGHT FOREARM;  Surgeon: Leanora Cover, MD;  Location: [place];  Service: Orthopedics;  Laterality: Right;       Home Medications    Prior to Admission medications   Medication Sig Start Date End Date Taking? Authorizing Provider  abacavir-dolutegravir-lamiVUDine (TRIUMEQ) 600-50-300 MG tablet Take 1 tablet by mouth daily. 04/16/16  Yes Michel Bickers, MD  amoxicillin (AMOXIL) 500 MG capsule Take 1 capsule (500 mg total) by mouth 3 (three) times daily. Patient not taking: Reported on 11/04/2016 07/03/16   Verlee Monte, MD  desipramine (NORPRAMIN) 25 MG tablet Take 1 tablet (25 mg total) by mouth at bedtime. Patient not taking: Reported on 11/04/2016 05/06/16   Manus Gunning, MD  Na Sulfate-K Sulfate-Mg Sulf [facility] BOWEL PREP KIT) 17.5-3.13-1.6 GM/180ML SOLN Use as directed Patient not taking: Reported on 11/04/2016 05/06/16   Manus Gunning, MD  ondansetron (ZOFRAN) 4 MG tablet Take 1 tablet (4 mg total) by mouth every 4 (four) hours as needed for nausea. Patient not  taking: Reported on 11/04/2016 07/03/16   Verlee Monte, MD    Family History Family History  Problem Relation Age of Onset  . Diabetes Mother   . Irritable bowel syndrome Mother   . Hypertension Mother   . Hyperlipidemia Mother   . Hypothyroidism Mother   . Diabetes Maternal Uncle   . Heart disease Maternal Grandmother   . Leukemia Other     maternal greatgrandmother  . Colon cancer Neg Hx     Social History Social History  Substance Use Topics  . Smoking status: Current Every Day  Smoker    Packs/day: 1.00    Years: 8.00    Types: Cigarettes  . Smokeless tobacco: Never Used  . Alcohol use 0.6 oz/week    1 Shots of liquor per week     Comment: Occ     Allergies   Acetaminophen; Other; Vicodin [hydrocodone-acetaminophen]; and Dilaudid [hydromorphone hcl]   Review of Systems Review of Systems  Respiratory: Negative for shortness of breath.   Cardiovascular: Negative for chest pain.  Gastrointestinal: Positive for vomiting (NBNB emesis yesterday.). Negative for abdominal pain.  Neurological: Negative for headaches.  Ten systems are reviewed and are negative for acute change except as noted in the HPI    Physical Exam Updated Vital Signs BP 125/81 (BP Location: Left Arm)   Pulse 100   Temp 100.1 F (37.8 C) (Oral)   Resp 19   Ht '5\' 11"'$  (1.803 m)   Wt 168 lb (76.2 kg)   SpO2 98%   BMI 23.43 kg/m   Physical Exam  Constitutional: He is oriented to person, place, and time. He appears well-developed and well-nourished. No distress.  HENT:  Head: Normocephalic and atraumatic.  Nose: Nose normal.  Eyes: Conjunctivae and EOM are normal. Pupils are equal, round, and reactive to light. Right eye exhibits no discharge. Left eye exhibits no discharge. No scleral icterus.  Neck: Normal range of motion. Neck supple.  Cardiovascular: Normal rate and regular rhythm.  Exam reveals no gallop and no friction rub.   No murmur heard. Pulmonary/Chest: Effort normal and breath sounds normal. No stridor. No respiratory distress. He has no rales.  Abdominal: Soft. He exhibits no distension. There is no tenderness.  Musculoskeletal: He exhibits no edema.       Right forearm: He exhibits tenderness and swelling.       Arms: Neurological: He is alert and oriented to person, place, and time.  Skin: Skin is warm and dry. No rash noted. He is not diaphoretic. No erythema.  Psychiatric: He has a normal mood and affect.  Vitals reviewed.    ED Treatments / Results   Labs (all labs ordered are listed, but only abnormal results are displayed) Labs Reviewed  CBC WITH DIFFERENTIAL/PLATELET - Abnormal; Notable for the following:       Result Value   WBC 19.6 (*)    All other components within normal limits  COMPREHENSIVE METABOLIC PANEL    EKG  EKG Interpretation None       Radiology No results found.  Procedures Procedures (including critical care time)  Medications Ordered in ED Medications  ondansetron (ZOFRAN) injection 4 mg (4 mg Intravenous Given 11/04/16 1506)     Initial Impression / Assessment and Plan / ED Course  I have reviewed the triage vital signs and the nursing notes.  Pertinent labs & imaging results that were available during my care of the patient were reviewed by me and considered in my medical decision making (  see chart for details).  Clinical Course as of Nov 04 2350  Wed Nov 04, 2016  1554 Concerning for recurrent deep abscess of the right forearm extending into the right elbow joint. Patient given empiric clindamycin, pain medicine, IV fluids. Hand surgery consulted.   [PC]  49 Spoke with Dr. Burney Gauze from hand surgery who requested patient be admitted to hospitalist service. He will see the patient. Admission and plan for incision and drainage in the OR.  [PC]    Clinical Course User Index [PC] Fatima Blank, MD      Final Clinical Impressions(s) / ED Diagnoses   Final diagnoses:  Abscess of right forearm      Fatima Blank, MD 11/04/16 [phone]

## 2016-11-04 NOTE — Transfer of Care (Signed)
Immediate Anesthesia Transfer of Care Note  Patient: Pedro Owens  Procedure(s) Performed: Procedure(s): IRRIGATION AND DEBRIDEMENT EXTREMITY (Right)  Patient Location: PACU  Anesthesia Type:General  Level of Consciousness: awake  Airway & Oxygen Therapy: Patient Spontanous Breathing and Patient connected to nasal cannula oxygen  Post-op Assessment: Report given to RN and Post -op Vital signs reviewed and stable  Post vital signs: Reviewed and stable  Last Vitals:  Vitals:   11/04/16 1700 11/04/16 1942  BP: 127/77   Pulse: 79   Resp:  (P) 19  Temp:  (P) 36.4 C    Last Pain:  Vitals:   11/04/16 1643  TempSrc:   PainSc: 6          Complications: No apparent anesthesia complications

## 2016-11-04 NOTE — Anesthesia Preprocedure Evaluation (Addendum)
Anesthesia Evaluation  Patient identified by MRN, date of birth, ID band Patient awake    Airway Mallampati: II  TM Distance: >3 FB Neck ROM: Full    Dental no notable dental hx.    Pulmonary Current Smoker,    Pulmonary exam normal breath sounds clear to auscultation       Cardiovascular Normal cardiovascular exam Rhythm:Regular Rate:Normal     Neuro/Psych Depression    GI/Hepatic (+)     substance abuse  marijuana use and IV drug use,   Endo/Other    Renal/GU      Musculoskeletal   Abdominal   Peds  (+) ADHD Hematology  (+) HIV,   Anesthesia Other Findings   Reproductive/Obstetrics                            Anesthesia Physical Anesthesia Plan  ASA: II  Anesthesia Plan: General   Post-op Pain Management:    Induction: Intravenous  Airway Management Planned: LMA  Additional Equipment:   Intra-op Plan:   Post-operative Plan: Extubation in OR  Informed Consent: I have reviewed the patients History and Physical, chart, labs and discussed the procedure including the risks, benefits and alternatives for the proposed anesthesia with the patient or authorized representative who has indicated his/her understanding and acceptance.   Dental advisory given  Plan Discussed with: CRNA  Anesthesia Plan Comments:         Anesthesia Quick Evaluation                                   Anesthesia Evaluation  Patient identified by MRN, date of birth, ID band Patient awake    Reviewed: Allergy & Precautions, NPO status , Patient's Chart, lab work & pertinent test results  Airway Mallampati: II  TM Distance: >3 FB Neck ROM: Full    Dental  (+) Teeth Intact, Dental Advisory Given   Pulmonary neg pulmonary ROS, Current Smoker,    Pulmonary exam normal breath sounds clear to auscultation       Cardiovascular Exercise Tolerance: Good negative cardio ROS Normal  cardiovascular exam Rhythm:Regular Rate:Normal     Neuro/Psych Seizures -,  PSYCHIATRIC DISORDERS Anxiety Depression    GI/Hepatic negative GI ROS, (+)     substance abuse (last cocaine use >2.62months ago)  cocaine use and marijuana use, Ileitis   Endo/Other  negative endocrine ROS  Renal/GU negative Renal ROS     Musculoskeletal negative musculoskeletal ROS (+)   Abdominal   Peds  (+) ADHD Hematology  (+) Blood dyscrasia, anemia , HIV,   Anesthesia Other Findings Day of surgery medications reviewed with the patient.  Reproductive/Obstetrics                            Anesthesia Physical Anesthesia Plan  ASA: III  Anesthesia Plan: General   Post-op Pain Management:    Induction: Intravenous  Airway Management Planned: LMA  Additional Equipment:   Intra-op Plan:   Post-operative Plan: Extubation in OR  Informed Consent: I have reviewed the patients History and Physical, chart, labs and discussed the procedure including the risks, benefits and alternatives for the proposed anesthesia with the patient or authorized representative who has indicated his/her understanding and acceptance.   Dental advisory given  Plan Discussed with: CRNA  Anesthesia Plan Comments: (Risks/benefits of general anesthesia discussed with  patient including risk of damage to teeth, lips, gum, and tongue, nausea/vomiting, allergic reactions to medications, and the possibility of heart attack, stroke and death.  All patient questions answered.  Patient wishes to proceed.)        Anesthesia Quick Evaluation

## 2016-11-04 NOTE — Progress Notes (Signed)
Pharmacy Antibiotic Note  JAQUAY LUCIO is a 26 y.o. male admitted on 11/04/2016 with cellulitis and abscess.  Pharmacy has been consulted for vancomycin dosing.  Plan: Vancomycin 1g IV every 8 hours.  Goal trough 15-20 mcg/mL.  Monitor culture data, renal function and clinical course VT at SS prn  Height: 5\' 11"  (180.3 cm) Weight: 168 lb (76.2 kg) IBW/kg (Calculated) : 75.3  Temp (24hrs), Avg:98.4 F (36.9 C), Min:97.6 F (36.4 C), Max:100.1 F (37.8 C)   Recent Labs Lab 11/04/16 1500  WBC 19.6*  CREATININE 1.19    Estimated Creatinine Clearance: 101.9 mL/min (by C-G formula based on SCr of 1.19 mg/dL).    Allergies  Allergen Reactions  . Acetaminophen Diarrhea    Flares up crohns disease  . Other     IV CONTRAST  . Vicodin [Hydrocodone-Acetaminophen] Nausea And Vomiting  . Dilaudid [Hydromorphone Hcl] Hives and Rash     Arlean Hopping. Newman Pies, PharmD, BCPS Clinical Pharmacist Pager [phone] 11/04/2016 9:20 PM

## 2016-11-05 ENCOUNTER — Encounter (HOSPITAL_COMMUNITY): Payer: Self-pay | Admitting: Orthopedic Surgery

## 2016-11-05 LAB — BASIC METABOLIC PANEL
Anion gap: 5 (ref 5–15)
BUN: 8 mg/dL (ref 6–20)
CALCIUM: 7.5 mg/dL — AB (ref 8.9–10.3)
CO2: 26 mmol/L (ref 22–32)
Chloride: 108 mmol/L (ref 101–111)
Creatinine, Ser: 1.05 mg/dL (ref 0.61–1.24)
GFR calc Af Amer: >60 mL/min (ref >60)
GLUCOSE: 97 mg/dL (ref 65–99)
POTASSIUM: 3.2 mmol/L — AB (ref 3.5–5.1)
Sodium: 139 mmol/L (ref 135–145)

## 2016-11-05 LAB — CBC
HEMATOCRIT: 38.3 % — AB (ref 39.0–52.0)
Hemoglobin: 12.9 g/dL — ABNORMAL LOW (ref 13.0–17.0)
MCH: 28.6 pg (ref 26.0–34.0)
MCHC: 33.7 g/dL (ref 30.0–36.0)
MCV: 84.9 fL (ref 78.0–100.0)
Platelets: 257 10*3/uL (ref 150–400)
RBC: 4.51 MIL/uL (ref 4.22–5.81)
RDW: 13.5 % (ref 11.5–15.5)
WBC: 14.5 10*3/uL — ABNORMAL HIGH (ref 4.0–10.5)

## 2016-11-05 LAB — RAPID URINE DRUG SCREEN, HOSP PERFORMED
Amphetamines: NOT DETECTED
BARBITURATES: NOT DETECTED
BENZODIAZEPINES: POSITIVE — AB
Cocaine: POSITIVE — AB
Opiates: NOT DETECTED
Tetrahydrocannabinol: POSITIVE — AB

## 2016-11-05 MED ORDER — CHLORHEXIDINE GLUCONATE 4 % EX LIQD
60.0000 mL | Freq: Once | CUTANEOUS | Status: AC
  Administered 2016-11-06: 4 via TOPICAL
  Filled 2016-11-05: qty 60

## 2016-11-05 MED ORDER — OXYCODONE HCL 5 MG PO TABS
5.0000 mg | ORAL_TABLET | ORAL | Status: DC | PRN
  Administered 2016-11-05 – 2016-11-06 (×4): 10 mg via ORAL
  Filled 2016-11-05 (×4): qty 2

## 2016-11-05 MED ORDER — MORPHINE SULFATE (PF) 4 MG/ML IV SOLN
4.0000 mg | Freq: Once | INTRAVENOUS | Status: AC
  Administered 2016-11-05: 4 mg via INTRAVENOUS
  Filled 2016-11-05: qty 1

## 2016-11-05 MED ORDER — SODIUM CHLORIDE 0.9 % IV BOLUS (SEPSIS)
500.0000 mL | Freq: Once | INTRAVENOUS | Status: AC
Start: 2016-11-05 — End: 2016-11-05
  Administered 2016-11-05: 500 mL via INTRAVENOUS

## 2016-11-05 MED ORDER — OXYCODONE-ACETAMINOPHEN 5-325 MG PO TABS
1.0000 | ORAL_TABLET | ORAL | Status: AC | PRN
Start: 2016-11-05 — End: 2016-11-05
  Administered 2016-11-05 (×3): 2 via ORAL
  Filled 2016-11-05 (×3): qty 2

## 2016-11-05 MED ORDER — ACETAMINOPHEN 325 MG PO TABS: 650.0000 mg | ORAL_TABLET | Freq: Four times a day (QID) | ORAL | Status: DC | PRN

## 2016-11-05 MED ORDER — MORPHINE SULFATE (PF) 2 MG/ML IV SOLN
2.0000 mg | INTRAVENOUS | Status: DC | PRN
  Administered 2016-11-05 – 2016-11-09 (×15): 2 mg via INTRAVENOUS
  Filled 2016-11-05 (×15): qty 1

## 2016-11-05 MED ORDER — GUAIFENESIN-DM 100-10 MG/5ML PO SYRP
5.0000 mL | ORAL_SOLUTION | Freq: Four times a day (QID) | ORAL | Status: DC | PRN
  Administered 2016-11-05: 5 mL via ORAL
  Filled 2016-11-05: qty 5

## 2016-11-05 MED ORDER — PIPERACILLIN-TAZOBACTAM 3.375 G IVPB
3.3750 g | Freq: Three times a day (TID) | INTRAVENOUS | Status: DC
  Administered 2016-11-05 – 2016-11-08 (×10): 3.375 g via INTRAVENOUS
  Filled 2016-11-05 (×11): qty 50

## 2016-11-05 NOTE — Progress Notes (Signed)
Patient seen and examined at bedside this afternoon. Patient denies any significant discomfort worsening of his symptoms. His dressings clean and intact. Distally he is neurovascularly intact. His Feely blood cell count is trending downward at 14,000. At this point I'm recommending repeat incision and drainage with possible secondary wound closure tomorrow 11/06/2016. Would recommend DC on oral antibiotics after Thau count normalizes and final cultures are available.

## 2016-11-05 NOTE — Progress Notes (Signed)
PROGRESS NOTE    DAILY CRATE  ZOX:[id number] DOB: 1990/10/25 DOA: 11/04/2016 PCP: Diamantina Providence, FNP  Brief Narrative:Pedro Owens is a 26 y.o. male with history of HIV, IV drug abuse presented to the ER because of right forearm pain and swelling. S/p I&D last night.   Assessment & Plan:  Right forearm abscess  -due to IVDA -status post incision and drainage 1/17 -prior h/o same in 06/2016 -continue  vancomycin and Zosyn, FU culture data -plan for further debridement on Friday, 11/06/2016  -pain control, added Oral oxycodone  H/o IVDA -FU Blood cx -ongoing for 6months -counseled -CSW consulted  H/o HIV - last CD4 count in the system was around 790 ON 05/2016.  -followed by Dr.Campbell, continue HAART  Hypokalemia -replace  DVT prophylaxis: SCDs, add lovenox after next 1&D tomorrow Code Status: Full code.  Family Communication: Discussed with patient.  Disposition Plan: Home.    Consultants:   Dr.Weingold   Procedures:Incision and drainage of Right volar proximal forearm deep abscess 1/17   Antimicrobials: Vanc   Subjective: C/o  R arm pain  Objective: Vitals:   11/04/16 2020 11/04/16 2140 11/05/16 0000 11/05/16 0439  BP: 117/79 127/72  (!) 103/46  Pulse: 76 91  81  Resp: 15 (!) 22    Temp: 97.6 F (36.4 C) 99.2 F (37.3 C) (!) 101.4 F (38.6 C) 98.3 F (36.8 C)  TempSrc:  Oral Oral Oral  SpO2: 99% 100%  97%  Weight:      Height:        Intake/Output Summary (Last 24 hours) at 11/05/16 1153 Last data filed at 11/05/16 0942  Gross per 24 hour  Intake          1528.33 ml  Output             1100 ml  Net           428.33 ml   Filed Weights   11/04/16 1433  Weight: 76.2 kg (168 lb)    Examination:  General exam: AAOx3 Respiratory system: Clear to auscultation. Respiratory effort normal. Cardiovascular system: S1 & S2 heard, RRR. No JVD, murmurs, rubs, gallops or clicks. No pedal edema. Gastrointestinal system: Abdomen is  nondistended, soft and nontender. No organomegaly or masses felt. Normal bowel sounds heard. Central nervous system: Alert and oriented. No focal neurological deficits. Extremities: R arm with swelling Skin: No rashes, lesions or ulcers Psychiatry: flat affect    Data Reviewed: I have personally reviewed following labs and imaging studies  CBC:  Recent Labs Lab 11/04/16 1500 11/05/16 0527  WBC 19.6* 14.5*  NEUTROABS 15.7*  --   HGB 15.2 12.9*  HCT 43.9 38.3*  MCV 83.3 84.9  PLT 297 257   Basic Metabolic Panel:  Recent Labs Lab 11/04/16 1500 11/05/16 0527  NA 135 139  K 3.4* 3.2*  CL 103 108  CO2 23 26  GLUCOSE 120* 97  BUN 11 8  CREATININE 1.19 1.05  CALCIUM 8.6* 7.5*   GFR: Estimated Creatinine Clearance: 115.5 mL/min (by C-G formula based on SCr of 1.05 mg/dL). Liver Function Tests:  Recent Labs Lab 11/04/16 1500  AST 45*  ALT 59  ALKPHOS 60  BILITOT 0.8  PROT 8.6*  ALBUMIN 3.6   No results for input(s): LIPASE, AMYLASE in the last 168 hours. No results for input(s): AMMONIA in the last 168 hours. Coagulation Profile: No results for input(s): INR, PROTIME in the last 168 hours. Cardiac Enzymes: No results for input(s): CKTOTAL,  CKMB, CKMBINDEX, TROPONINI in the last 168 hours. BNP (last 3 results) No results for input(s): PROBNP in the last 8760 hours. HbA1C: No results for input(s): HGBA1C in the last 72 hours. CBG: No results for input(s): GLUCAP in the last 168 hours. Lipid Profile: No results for input(s): CHOL, HDL, LDLCALC, TRIG, CHOLHDL, LDLDIRECT in the last 72 hours. Thyroid Function Tests: No results for input(s): TSH, T4TOTAL, FREET4, T3FREE, THYROIDAB in the last 72 hours. Anemia Panel: No results for input(s): VITAMINB12, FOLATE, FERRITIN, TIBC, IRON, RETICCTPCT in the last 72 hours. Urine analysis:    Component Value Date/Time   COLORURINE YELLOW 06/30/2016 1500   APPEARANCEUR CLEAR 06/30/2016 1500   LABSPEC <1.005 (L)  06/30/2016 1500   PHURINE 6.0 06/30/2016 1500   GLUCOSEU NEGATIVE 06/30/2016 1500   HGBUR SMALL (A) 06/30/2016 1500   BILIRUBINUR NEGATIVE 06/30/2016 1500   KETONESUR NEGATIVE 06/30/2016 1500   PROTEINUR NEGATIVE 06/30/2016 1500   UROBILINOGEN 1.0 08/04/2015 1935   NITRITE NEGATIVE 06/30/2016 1500   LEUKOCYTESUR TRACE (A) 06/30/2016 1500   Sepsis Labs: @LABRCNTIP (procalcitonin:4,lacticidven:4)  ) Recent Results (from the past 240 hour(s))  Aerobic/Anaerobic Culture (surgical/deep wound)     Status: None (Preliminary result)   Collection Time: 11/04/16  7:08 PM  Result Value Ref Range Status   Specimen Description ABSCESS RIGHT FOREARM  Final   Special Requests NONE  Final   Gram Stain   Final    MODERATE WBC PRESENT,BOTH PMN AND MONONUCLEAR ABUNDANT GRAM POSITIVE COCCI IN CHAINS MODERATE GRAM NEGATIVE RODS    Culture PENDING  Incomplete   Report Status PENDING  Incomplete         Radiology Studies: No results found.      Scheduled Meds: . abacavir-dolutegravir-lamiVUDine  1 tablet Oral Daily  . piperacillin-tazobactam (ZOSYN)  IV  3.375 g Intravenous Q8H  . vancomycin  1,000 mg Intravenous Q8H   Continuous Infusions: . sodium chloride 100 mL/hr at 11/05/16 0942     LOS: 1 day    Time spent:    Zannie Cove, MD [facility] Pager [phone]  If 7PM-7AM, please contact night-coverage [email or web address] Password [facility] 11/05/2016, 11:53 AM

## 2016-11-05 NOTE — Progress Notes (Signed)
On behalf of sharon powers,rn 

## 2016-11-05 NOTE — Op Note (Signed)
NAME:  Pedro Owens, Pedro Owens NO.:  MEDICAL RECORD NO.:  [id number]  LOCATION:                                 FACILITY:  PHYSICIAN:  Ciin Brazzel A. Nyasia Baxley, M.D.DATE OF BIRTH:  [birth year]  DATE OF PROCEDURE:  11/04/2016 DATE OF DISCHARGE:                              OPERATIVE REPORT   PREOPERATIVE DIAGNOSIS:  Right volar proximal forearm deep abscess.  POSTOPERATIVE DIAGNOSIS:  Right volar proximal forearm deep abscess.  PROCEDURE:  Incision and drainage of above with cultures.  Wound packed open.  SURGEON:  Artist Pais. Mina Marble, M.D.  ASSISTANT:  None.  ANESTHESIA:  General.  COMPLICATION:  None.  DRAINS:  None.  DESCRIPTION OF PROCEDURE:  The patient was taken to the operating suite. After induction of adequate general anesthetic, right upper extremity was prepped and draped in sterile fashion.  An Esmarch was used to exsanguinate the limb.  Tourniquet was inflated to 250 mmHg.  At this point in time, an incision was made on the proximal aspect of the forearm from the middle of the forearm to the antecubital fossa crease on the ulnar side where a large raised area was encountered.  The skin was incised sharply.  Purulence was encountered and cultured.  We dissected down to the fascia overlying the flexor pronator mass.  There was purulence encountered in this area.  We carefully tied off 1 large vein that had been occluded with a foreign material.  This was done with 2-0 silk ties.  We resected the thrombosed area.  We then carefully incised the fascia overlying this area to inspect the deeper muscle layers which were intact, was edematous type fluid only.  We then used 6 L of normal saline to thoroughly irrigate out this wound.  After the irrigant was clear after 6 L, we loosely packed it open with a quarter- inch iodoform gauze with 3-0 nylon in 3 places to help approximate the skin edges.  We dressed with 4x4s, fluffs, and a compressive  bandage ABD, etc.  The patient tolerated this procedure well and went to recovery in stable fashion.     Artist Pais Mina Marble, M.D.    MAW/MEDQ  D:  11/04/2016  T:  11/05/2016  Job:  [id number]

## 2016-11-06 ENCOUNTER — Inpatient Hospital Stay (HOSPITAL_COMMUNITY): Payer: BLUE CROSS/BLUE SHIELD | Admitting: Anesthesiology

## 2016-11-06 ENCOUNTER — Encounter (HOSPITAL_COMMUNITY)
Admission: EM | Disposition: A | Discharge status: Home / Self Care | Payer: Self-pay | Source: Home / Self Care | Attending: Internal Medicine

## 2016-11-06 ENCOUNTER — Encounter (HOSPITAL_COMMUNITY): Payer: Self-pay | Admitting: General Practice

## 2016-11-06 DIAGNOSIS — L02413 Cutaneous abscess of right upper limb: Principal | ICD-10-CM

## 2016-11-06 HISTORY — PX: I & D EXTREMITY: SHX5045

## 2016-11-06 LAB — BASIC METABOLIC PANEL
Anion gap: 5 (ref 5–15)
BUN: 7 mg/dL (ref 6–20)
CHLORIDE: 107 mmol/L (ref 101–111)
CO2: 26 mmol/L (ref 22–32)
CREATININE: 1.06 mg/dL (ref 0.61–1.24)
Calcium: 7.9 mg/dL — ABNORMAL LOW (ref 8.9–10.3)
GFR calc Af Amer: >60 mL/min (ref >60)
GFR calc non Af Amer: >60 mL/min (ref >60)
Glucose, Bld: 89 mg/dL (ref 65–99)
POTASSIUM: 3.3 mmol/L — AB (ref 3.5–5.1)
Sodium: 138 mmol/L (ref 135–145)

## 2016-11-06 LAB — CBC
HCT: 36.1 % — ABNORMAL LOW (ref 39.0–52.0)
HEMOGLOBIN: 12.1 g/dL — AB (ref 13.0–17.0)
MCH: 28.3 pg (ref 26.0–34.0)
MCHC: 33.5 g/dL (ref 30.0–36.0)
MCV: 84.5 fL (ref 78.0–100.0)
Platelets: 228 10*3/uL (ref 150–400)
RBC: 4.27 MIL/uL (ref 4.22–5.81)
RDW: 13.4 % (ref 11.5–15.5)
WBC: 10.1 10*3/uL (ref 4.0–10.5)

## 2016-11-06 LAB — MRSA PCR SCREENING: MRSA by PCR: NEGATIVE

## 2016-11-06 SURGERY — IRRIGATION AND DEBRIDEMENT EXTREMITY
Anesthesia: General | Site: Arm Upper | Laterality: Right

## 2016-11-06 MED ORDER — ONDANSETRON HCL 4 MG/2ML IJ SOLN
INTRAMUSCULAR | Status: DC | PRN
Start: 2016-11-06 — End: 2016-11-06
  Administered 2016-11-06: 4 mg via INTRAVENOUS

## 2016-11-06 MED ORDER — FENTANYL CITRATE (PF) 100 MCG/2ML IJ SOLN
INTRAMUSCULAR | Status: AC
  Filled 2016-11-06: qty 2

## 2016-11-06 MED ORDER — PROPOFOL 10 MG/ML IV BOLUS
INTRAVENOUS | Status: DC | PRN
  Administered 2016-11-06: 200 mg via INTRAVENOUS

## 2016-11-06 MED ORDER — MIDAZOLAM HCL 2 MG/2ML IJ SOLN
INTRAMUSCULAR | Status: AC
  Filled 2016-11-06: qty 2

## 2016-11-06 MED ORDER — WHITE PETROLATUM GEL
Status: AC
  Administered 2016-11-06: 03:00:00
  Filled 2016-11-06: qty 1

## 2016-11-06 MED ORDER — OXYCODONE-ACETAMINOPHEN 5-325 MG PO TABS
1.0000 | ORAL_TABLET | Freq: Four times a day (QID) | ORAL | Status: DC | PRN
  Administered 2016-11-07 – 2016-11-08 (×3): 2 via ORAL
  Administered 2016-11-08: 1 via ORAL
  Administered 2016-11-09: 2 via ORAL
  Filled 2016-11-06 (×9): qty 2

## 2016-11-06 MED ORDER — SENNOSIDES-DOCUSATE SODIUM 8.6-50 MG PO TABS
1.0000 | ORAL_TABLET | Freq: Two times a day (BID) | ORAL | Status: DC
  Administered 2016-11-06 – 2016-11-09 (×4): 1 via ORAL
  Filled 2016-11-06 (×5): qty 1

## 2016-11-06 MED ORDER — KETOROLAC TROMETHAMINE 30 MG/ML IJ SOLN
30.0000 mg | Freq: Four times a day (QID) | INTRAMUSCULAR | Status: DC
  Administered 2016-11-06 – 2016-11-09 (×11): 30 mg via INTRAVENOUS
  Filled 2016-11-06 (×11): qty 1

## 2016-11-06 MED ORDER — LIDOCAINE HCL (CARDIAC) 20 MG/ML IV SOLN
INTRAVENOUS | Status: DC | PRN
  Administered 2016-11-06: 100 mg via INTRAVENOUS

## 2016-11-06 MED ORDER — FENTANYL CITRATE (PF) 100 MCG/2ML IJ SOLN
INTRAMUSCULAR | Status: DC | PRN
  Administered 2016-11-06 (×2): 50 ug via INTRAVENOUS

## 2016-11-06 MED ORDER — OXYCODONE-ACETAMINOPHEN 5-325 MG PO TABS: 1.0000 | ORAL_TABLET | ORAL | Status: DC | PRN

## 2016-11-06 MED ORDER — BUPIVACAINE HCL (PF) 0.25 % IJ SOLN
INTRAMUSCULAR | Status: DC | PRN
  Administered 2016-11-06: 10 mL

## 2016-11-06 MED ORDER — METHOCARBAMOL 500 MG PO TABS
500.0000 mg | ORAL_TABLET | Freq: Three times a day (TID) | ORAL | Status: DC
  Administered 2016-11-06 – 2016-11-09 (×9): 500 mg via ORAL
  Filled 2016-11-06 (×9): qty 1

## 2016-11-06 MED ORDER — MIDAZOLAM HCL 5 MG/5ML IJ SOLN
INTRAMUSCULAR | Status: DC | PRN
  Administered 2016-11-06: 2 mg via INTRAVENOUS

## 2016-11-06 MED ORDER — 0.9 % SODIUM CHLORIDE (POUR BTL) OPTIME
TOPICAL | Status: DC | PRN
  Administered 2016-11-06: 1000 mL

## 2016-11-06 MED ORDER — POTASSIUM CHLORIDE CRYS ER 20 MEQ PO TBCR
40.0000 meq | EXTENDED_RELEASE_TABLET | Freq: Once | ORAL | Status: AC
Start: 2016-11-06 — End: 2016-11-06
  Administered 2016-11-06: 40 meq via ORAL
  Filled 2016-11-06: qty 2

## 2016-11-06 MED ORDER — POLYETHYLENE GLYCOL 3350 17 G PO PACK
17.0000 g | PACK | Freq: Every day | ORAL | Status: DC
  Administered 2016-11-09: 17 g via ORAL
  Filled 2016-11-06 (×2): qty 1

## 2016-11-06 MED ORDER — SODIUM CHLORIDE 0.9 % IV SOLN
INTRAVENOUS | Status: DC | PRN
  Administered 2016-11-06: 17:00:00 via INTRAVENOUS

## 2016-11-06 MED ORDER — BUPIVACAINE HCL (PF) 0.25 % IJ SOLN
INTRAMUSCULAR | Status: AC
  Filled 2016-11-06: qty 30

## 2016-11-06 MED ORDER — FENTANYL CITRATE (PF) 100 MCG/2ML IJ SOLN
INTRAMUSCULAR | Status: AC
  Administered 2016-11-06: 100 ug
  Filled 2016-11-06: qty 2

## 2016-11-06 MED ORDER — SODIUM CHLORIDE 0.9 % IR SOLN
Status: DC | PRN
  Administered 2016-11-06 (×2): 3000 mL

## 2016-11-06 SURGICAL SUPPLY — 59 items
BANDAGE ACE 4X5 VEL STRL LF (GAUZE/BANDAGES/DRESSINGS) ×3 IMPLANT
BANDAGE ELASTIC 3 VELCRO ST LF (GAUZE/BANDAGES/DRESSINGS) ×2 IMPLANT
BNDG CMPR 9X4 STRL LF SNTH (GAUZE/BANDAGES/DRESSINGS)
BNDG CONFORM 2 STRL LF (GAUZE/BANDAGES/DRESSINGS) IMPLANT
BNDG ESMARK 4X9 LF (GAUZE/BANDAGES/DRESSINGS) IMPLANT
BNDG GAUZE ELAST 4 BULKY (GAUZE/BANDAGES/DRESSINGS) ×3 IMPLANT
CORDS BIPOLAR (ELECTRODE) ×1 IMPLANT
COVER SURGICAL LIGHT HANDLE (MISCELLANEOUS) ×3 IMPLANT
CUFF TOURNIQUET SINGLE 18IN (TOURNIQUET CUFF) ×3 IMPLANT
DECANTER SPIKE VIAL GLASS SM (MISCELLANEOUS) ×2 IMPLANT
DRAIN PENROSE 1/4X12 LTX STRL (WOUND CARE) IMPLANT
DRAPE SURG 17X23 STRL (DRAPES) ×2 IMPLANT
DRSG PAD ABDOMINAL 8X10 ST (GAUZE/BANDAGES/DRESSINGS) ×4 IMPLANT
DURAPREP 26ML APPLICATOR (WOUND CARE) IMPLANT
ELECT REM PT RETURN 9FT ADLT (ELECTROSURGICAL)
ELECTRODE REM PT RTRN 9FT ADLT (ELECTROSURGICAL) IMPLANT
GAUZE PACKING IODOFORM 1/4X5 (PACKING) IMPLANT
GAUZE SPONGE 4X4 12PLY STRL (GAUZE/BANDAGES/DRESSINGS) ×4 IMPLANT
GAUZE XEROFORM 1X8 LF (GAUZE/BANDAGES/DRESSINGS) ×2 IMPLANT
GLOVE BIO SURGEON STRL SZ 6.5 (GLOVE) ×1 IMPLANT
GLOVE SURG SYN 8.0 (GLOVE) ×2 IMPLANT
GLOVE SURG SYN 8.0 PF PI (GLOVE) ×1 IMPLANT
GOWN STRL REUS W/ TWL LRG LVL3 (GOWN DISPOSABLE) ×1 IMPLANT
GOWN STRL REUS W/ TWL XL LVL3 (GOWN DISPOSABLE) ×1 IMPLANT
GOWN STRL REUS W/TWL LRG LVL3 (GOWN DISPOSABLE) ×2
GOWN STRL REUS W/TWL XL LVL3 (GOWN DISPOSABLE) ×2
HANDPIECE INTERPULSE COAX TIP (DISPOSABLE)
IV NS IRRIG 3000ML ARTHROMATIC (IV SOLUTION) ×2 IMPLANT
KIT BASIN OR (CUSTOM PROCEDURE TRAY) ×2 IMPLANT
KIT ROOM TURNOVER OR (KITS) ×2 IMPLANT
MANIFOLD NEPTUNE II (INSTRUMENTS) ×2 IMPLANT
NDL HYPO 25GX1X1/2 BEV (NEEDLE) IMPLANT
NDL HYPO 25X1 1.5 SAFETY (NEEDLE) ×1 IMPLANT
NEEDLE HYPO 25GX1X1/2 BEV (NEEDLE) IMPLANT
NEEDLE HYPO 25X1 1.5 SAFETY (NEEDLE) ×2 IMPLANT
NS IRRIG 1000ML POUR BTL (IV SOLUTION) ×2 IMPLANT
PACK ORTHO EXTREMITY (CUSTOM PROCEDURE TRAY) ×2 IMPLANT
PAD ABD 8X10 STRL (GAUZE/BANDAGES/DRESSINGS) ×1 IMPLANT
PAD ARMBOARD 7.5X6 YLW CONV (MISCELLANEOUS) ×4 IMPLANT
PAD CAST 3X4 CTTN HI CHSV (CAST SUPPLIES) IMPLANT
PAD CAST 4YDX4 CTTN HI CHSV (CAST SUPPLIES) ×2 IMPLANT
PADDING CAST COTTON 3X4 STRL (CAST SUPPLIES) ×2
PADDING CAST COTTON 4X4 STRL (CAST SUPPLIES) ×4
SET HNDPC FAN SPRY TIP SCT (DISPOSABLE) IMPLANT
SPONGE GAUZE 4X4 12PLY STER LF (GAUZE/BANDAGES/DRESSINGS) ×1 IMPLANT
SPONGE LAP 18X18 X RAY DECT (DISPOSABLE) ×2 IMPLANT
SUCTION FRAZIER HANDLE 10FR (MISCELLANEOUS)
SUCTION TUBE FRAZIER 10FR DISP (MISCELLANEOUS) ×1 IMPLANT
SUT ETHILON 3 0 PS 1 (SUTURE) ×2 IMPLANT
SUT VICRYL RAPIDE 4/0 PS 2 (SUTURE) IMPLANT
SYR 20CC LL (SYRINGE) IMPLANT
SYR CONTROL 10ML LL (SYRINGE) ×2 IMPLANT
TOWEL OR 17X24 6PK STRL BLUE (TOWEL DISPOSABLE) ×2 IMPLANT
TOWEL OR 17X26 10 PK STRL BLUE (TOWEL DISPOSABLE) ×2 IMPLANT
TUBE ANAEROBIC SPECIMEN COL (MISCELLANEOUS) IMPLANT
TUBE CONNECTING 12X1/4 (SUCTIONS) ×2 IMPLANT
UNDERPAD 30X30 (UNDERPADS AND DIAPERS) ×2 IMPLANT
WATER STERILE IRR 1000ML POUR (IV SOLUTION) ×1 IMPLANT
YANKAUER SUCT BULB TIP NO VENT (SUCTIONS) ×2 IMPLANT

## 2016-11-06 NOTE — Op Note (Signed)
Seen and dictated note [phone]

## 2016-11-06 NOTE — Transfer of Care (Signed)
Immediate Anesthesia Transfer of Care Note  Patient: Pedro Owens  Procedure(s) Performed: Procedure(s): IRRIGATION AND DEBRIDEMENT EXTREMITY (Right)  Patient Location: PACU  Anesthesia Type:General  Level of Consciousness: sedated and patient cooperative  Airway & Oxygen Therapy: Patient Spontanous Breathing  Post-op Assessment: Report given to RN and Post -op Vital signs reviewed and stable  Post vital signs: Reviewed  Last Vitals:  Vitals:   11/06/16 1312 11/06/16 1337  BP: (!) 131/112 126/75  Pulse: 80   Resp: 19   Temp: 37.4 C     Last Pain:  Vitals:   11/06/16 1312  TempSrc: Oral  PainSc:       Patients Stated Pain Goal: 2 (11/05/16 0315)  Complications: No apparent anesthesia complications

## 2016-11-06 NOTE — Progress Notes (Signed)
PROGRESS NOTE    Pedro Owens  WUJ:[id number] DOB: 12/25/1990 DOA: 11/04/2016 PCP: Diamantina Providence, FNP  Brief Narrative:Pedro Owens is a 26 y.o. male with history of HIV, IV drug abuse presented to the ER because of right forearm pain and swelling. S/p I&D   Assessment & Plan:  Right forearm abscess  -due to IVDA -status post incision and drainage 1/17 -prior h/o same in 06/2016 -continue  vancomycin and Zosyn, FU culture data -plan for further debridement on Friday, 11/06/2016  -pain control, initially patient was on Percocet, then change to OxyIR, I would change him back to Percocet. Decrease the frequency. Also starting the patient on scheduled Toradol. Also starting the patient on scheduled Robaxin. Complaints about pain in the shoulder as well as his wrist. Occupational therapy consulted.  H/o IVDA -FU Blood cx -ongoing for 6months -counseled -CSW consulted  H/o HIV - last CD4 count in the system was around 790 ON 05/2016.  -followed by Dr.Campbell, continue HAART  Hypokalemia -replace  DVT prophylaxis: SCDs, add lovenox after next 1&D tomorrow Code Status: Full code.  Family Communication: Discussed with patient.  Disposition Plan: Home.    Consultants:   Dr.Weingold   Procedures:Incision and drainage of Right volar proximal forearm deep abscess 1/17   Antimicrobials: Vanc   Subjective: Complains about pain in the arm. It is about pain in the elbow and shoulder as well.  Objective: Vitals:   11/06/16 1308 11/06/16 1312 11/06/16 1337 11/06/16 1758  BP: (!) 148/84 (!) 131/112 126/75 111/85  Pulse: 73 80  61  Resp: 18 19  13   Temp: 98.2 F (36.8 C) 99.3 F (37.4 C)  97.5 F (36.4 C)  TempSrc: Oral Oral    SpO2: 98% 96%  100%  Weight:      Height:        Intake/Output Summary (Last 24 hours) at 11/06/16 1800 Last data filed at 11/06/16 1745  Gross per 24 hour  Intake          2803.33 ml  Output             1720 ml  Net           1083.33 ml   Filed Weights   11/04/16 1433  Weight: 76.2 kg (168 lb)    Examination:  General exam: AAOx3 Respiratory system: Clear to auscultation. Respiratory effort normal. Cardiovascular system: S1 & S2 heard, RRR. No JVD, murmurs, rubs, gallops or clicks. No pedal edema. Gastrointestinal system: Abdomen is nondistended, soft and nontender. No organomegaly or masses felt. Normal bowel sounds heard. Central nervous system: Alert and oriented. No focal neurological deficits. Extremities: R arm with swelling Skin: No rashes, lesions or ulcers Psychiatry: flat affect    Data Reviewed: I have personally reviewed following labs and imaging studies  CBC:  Recent Labs Lab 11/04/16 1500 11/05/16 0527 11/06/16 0617  WBC 19.6* 14.5* 10.1  NEUTROABS 15.7*  --   --   HGB 15.2 12.9* 12.1*  HCT 43.9 38.3* 36.1*  MCV 83.3 84.9 84.5  PLT 297 257 228   Basic Metabolic Panel:  Recent Labs Lab 11/04/16 1500 11/05/16 0527 11/06/16 0617  NA 135 139 138  K 3.4* 3.2* 3.3*  CL 103 108 107  CO2 23 26 26   GLUCOSE 120* 97 89  BUN 11 8 7   CREATININE 1.19 1.05 1.06  CALCIUM 8.6* 7.5* 7.9*   GFR: Estimated Creatinine Clearance: 114.4 mL/min (by C-G formula based on SCr of 1.06 mg/dL). Liver  Function Tests:  Recent Labs Lab 11/04/16 1500  AST 45*  ALT 59  ALKPHOS 60  BILITOT 0.8  PROT 8.6*  ALBUMIN 3.6   No results for input(s): LIPASE, AMYLASE in the last 168 hours. No results for input(s): AMMONIA in the last 168 hours. Coagulation Profile: No results for input(s): INR, PROTIME in the last 168 hours. Cardiac Enzymes: No results for input(s): CKTOTAL, CKMB, CKMBINDEX, TROPONINI in the last 168 hours. BNP (last 3 results) No results for input(s): PROBNP in the last 8760 hours. HbA1C: No results for input(s): HGBA1C in the last 72 hours. CBG: No results for input(s): GLUCAP in the last 168 hours. Lipid Profile: No results for input(s): CHOL, HDL, LDLCALC, TRIG,  CHOLHDL, LDLDIRECT in the last 72 hours. Thyroid Function Tests: No results for input(s): TSH, T4TOTAL, FREET4, T3FREE, THYROIDAB in the last 72 hours. Anemia Panel: No results for input(s): VITAMINB12, FOLATE, FERRITIN, TIBC, IRON, RETICCTPCT in the last 72 hours. Urine analysis:    Component Value Date/Time   COLORURINE YELLOW 06/30/2016 1500   APPEARANCEUR CLEAR 06/30/2016 1500   LABSPEC <1.005 (L) 06/30/2016 1500   PHURINE 6.0 06/30/2016 1500   GLUCOSEU NEGATIVE 06/30/2016 1500   HGBUR SMALL (A) 06/30/2016 1500   BILIRUBINUR NEGATIVE 06/30/2016 1500   KETONESUR NEGATIVE 06/30/2016 1500   PROTEINUR NEGATIVE 06/30/2016 1500   UROBILINOGEN 1.0 08/04/2015 1935   NITRITE NEGATIVE 06/30/2016 1500   LEUKOCYTESUR TRACE (A) 06/30/2016 1500   Sepsis Labs: @LABRCNTIP (procalcitonin:4,lacticidven:4)  ) Recent Results (from the past 240 hour(s))  Blood culture (routine x 2)     Status: None (Preliminary result)   Collection Time: 11/04/16  3:00 PM  Result Value Ref Range Status   Specimen Description BLOOD LEFT FOREARM  Final   Special Requests BOTTLES DRAWN AEROBIC AND ANAEROBIC 5CC  Final   Culture NO GROWTH 2 DAYS  Final   Report Status PENDING  Incomplete  Blood culture (routine x 2)     Status: None (Preliminary result)   Collection Time: 11/04/16  4:00 PM  Result Value Ref Range Status   Specimen Description BLOOD LEFT HAND  Final   Special Requests BOTTLES DRAWN AEROBIC AND ANAEROBIC 5CC  Final   Culture NO GROWTH 2 DAYS  Final   Report Status PENDING  Incomplete  Aerobic/Anaerobic Culture (surgical/deep wound)     Status: None (Preliminary result)   Collection Time: 11/04/16  7:08 PM  Result Value Ref Range Status   Specimen Description ABSCESS RIGHT FOREARM  Final   Special Requests NONE  Final   Gram Stain   Final    MODERATE WBC PRESENT,BOTH PMN AND MONONUCLEAR ABUNDANT GRAM POSITIVE COCCI IN CHAINS MODERATE GRAM NEGATIVE RODS    Culture CULTURE REINCUBATED FOR  BETTER GROWTH  Final   Report Status PENDING  Incomplete  Culture, blood (routine x 2)     Status: None (Preliminary result)   Collection Time: 11/04/16 10:30 PM  Result Value Ref Range Status   Specimen Description BLOOD RIGHT HAND  Final   Special Requests IN PEDIATRIC BOTTLE 1CC  Final   Culture NO GROWTH 2 DAYS  Final   Report Status PENDING  Incomplete  Culture, blood (routine x 2)     Status: None (Preliminary result)   Collection Time: 11/04/16 10:30 PM  Result Value Ref Range Status   Specimen Description BLOOD LEFT HAND  Final   Special Requests IN PEDIATRIC BOTTLE 3CC  Final   Culture NO GROWTH 2 DAYS  Final   Report  Status PENDING  Incomplete  MRSA PCR Screening     Status: None   Collection Time: 11/06/16 10:45 AM  Result Value Ref Range Status   MRSA by PCR NEGATIVE NEGATIVE Final    Comment:        The GeneXpert MRSA Assay (FDA approved for NASAL specimens only), is one component of a comprehensive MRSA colonization surveillance program. It is not intended to diagnose MRSA infection nor to guide or monitor treatment for MRSA infections.          Radiology Studies: No results found.      Scheduled Meds: . [MAR Hold] abacavir-dolutegravir-lamiVUDine  1 tablet Oral Daily  . [MAR Hold] ketorolac  30 mg Intravenous Q6H  . [MAR Hold] methocarbamol  500 mg Oral TID  . [MAR Hold] piperacillin-tazobactam (ZOSYN)  IV  3.375 g Intravenous Q8H  . [MAR Hold] polyethylene glycol  17 g Oral Daily  . [MAR Hold] senna-docusate  1 tablet Oral BID  . [MAR Hold] vancomycin  1,000 mg Intravenous Q8H   Continuous Infusions:    LOS: 2 days    Time spent:    Author:  Lynden Oxford, MD [facility] Pager: [phone] 11/06/2016 6:01 PM     If 7PM-7AM, please contact night-coverage [email or web address] Password [email or web address]

## 2016-11-06 NOTE — Progress Notes (Signed)
Patient underwent repeat incision and drainage and secondary wound closure of proximal right forearm infection. Wound closed loosely with no evidence of reinfection. Would recommend discharge on oral antibiotics one final cultures are available. We will need to see my office next week on Thursday for dressing change. Please call for any questions regarding this patient's care.

## 2016-11-06 NOTE — Anesthesia Preprocedure Evaluation (Signed)
Anesthesia Evaluation  Patient identified by MRN, date of birth, ID band Patient awake    Airway Mallampati: II  TM Distance: >3 FB Neck ROM: Full    Dental no notable dental hx.    Pulmonary Current Smoker,    Pulmonary exam normal breath sounds clear to auscultation       Cardiovascular hypertension, Normal cardiovascular exam Rhythm:Regular Rate:Normal     Neuro/Psych  Headaches, Seizures -,  Depression    GI/Hepatic (+)     substance abuse  marijuana use and IV drug use,   Endo/Other    Renal/GU      Musculoskeletal   Abdominal   Peds  (+) ADHD Hematology  (+) anemia , HIV,   Anesthesia Other Findings   Reproductive/Obstetrics                             Anesthesia Physical  Anesthesia Plan  ASA: II  Anesthesia Plan: General   Post-op Pain Management:    Induction: Intravenous  Airway Management Planned: LMA  Additional Equipment:   Intra-op Plan:   Post-operative Plan: Extubation in OR  Informed Consent: I have reviewed the patients History and Physical, chart, labs and discussed the procedure including the risks, benefits and alternatives for the proposed anesthesia with the patient or authorized representative who has indicated his/her understanding and acceptance.   Dental advisory given  Plan Discussed with: CRNA  Anesthesia Plan Comments:         Anesthesia Quick Evaluation                                   Anesthesia Evaluation  Patient identified by MRN, date of birth, ID band Patient awake    Reviewed: Allergy & Precautions, NPO status , Patient's Chart, lab work & pertinent test results  Airway Mallampati: II  TM Distance: >3 FB Neck ROM: Full    Dental  (+) Teeth Intact, Dental Advisory Given   Pulmonary neg pulmonary ROS, Current Smoker,    Pulmonary exam normal breath sounds clear to auscultation        Cardiovascular Exercise Tolerance: Good negative cardio ROS Normal cardiovascular exam Rhythm:Regular Rate:Normal     Neuro/Psych Seizures -,  PSYCHIATRIC DISORDERS Anxiety Depression    GI/Hepatic negative GI ROS, (+)     substance abuse (last cocaine use >2.66months ago)  cocaine use and marijuana use, Ileitis   Endo/Other  negative endocrine ROS  Renal/GU negative Renal ROS     Musculoskeletal negative musculoskeletal ROS (+)   Abdominal   Peds  (+) ADHD Hematology  (+) Blood dyscrasia, anemia , HIV,   Anesthesia Other Findings Day of surgery medications reviewed with the patient.  Reproductive/Obstetrics                            Anesthesia Physical Anesthesia Plan  ASA: III  Anesthesia Plan: General   Post-op Pain Management:    Induction: Intravenous  Airway Management Planned: LMA  Additional Equipment:   Intra-op Plan:   Post-operative Plan: Extubation in OR  Informed Consent: I have reviewed the patients History and Physical, chart, labs and discussed the procedure including the risks, benefits and alternatives for the proposed anesthesia with the patient or authorized representative who has indicated his/her understanding and acceptance.   Dental advisory given  Plan Discussed with: CRNA  Anesthesia Plan Comments: (Risks/benefits of general anesthesia discussed with patient including risk of damage to teeth, lips, gum, and tongue, nausea/vomiting, allergic reactions to medications, and the possibility of heart attack, stroke and death.  All patient questions answered.  Patient wishes to proceed.)        Anesthesia Quick Evaluation

## 2016-11-06 NOTE — Anesthesia Procedure Notes (Signed)
Procedure Name: LMA Insertion Date/Time: 11/06/2016 5:12 PM Performed by: Jefm Miles E Pre-anesthesia Checklist: Patient identified, Emergency Drugs available, Suction available and Patient being monitored Patient Re-evaluated:Patient Re-evaluated prior to inductionOxygen Delivery Method: Circle System Utilized Preoxygenation: Pre-oxygenation with 100% oxygen Intubation Type: IV induction Ventilation: Mask ventilation without difficulty LMA: LMA inserted LMA Size: 4.0 Number of attempts: 1 Placement Confirmation: positive ETCO2 Tube secured with: Tape Dental Injury: Teeth and Oropharynx as per pre-operative assessment

## 2016-11-06 NOTE — H&P (View-Only) (Signed)
Reason for Consult: Right forearm abscess Referring Physician: cardama  Pedro Owens is an 26 y.o. male.  HPI: As above with a history of intravenous drug abuse with abscesses in the past who presents today with a swollen and tense right proximal forearm with evidence of a deep abscess.  Past Medical History:  Diagnosis Date  . ADHD (attention deficit hyperactivity disorder)   . Anal warts   . Anxiety   . Chronic pain syndrome   . Convulsions ([place]) 08/14/2015  . Depression   . Gonorrhea 09/08/11  . Hemorrhoids, internal, with bleeding 08/25/2011  . HIV infection ([place])   . Ileitis   . ILEITIS 09/24/2010   Qualifier: Diagnosis of  By: Owens CMA (AAMA), Pedro    . Lymphoid hyperplasia, reactive   . Marijuana abuse   . Pseudoseizures   . Seizures ([place])     Past Surgical History:  Procedure Laterality Date  . ANKLE SURGERY Bilateral 2005   Screw & Pins  . FOOT SURGERY Bilateral 2005   Screw & Pins  . I&D EXTREMITY Right 06/26/2016   Procedure: IRRIGATION AND DEBRIDEMENT RIGHT FOREARM;  Surgeon: Pedro Cover, MD;  Location: [facility];  Service: Orthopedics;  Laterality: Right;    Family History  Problem Relation Age of Onset  . Diabetes Mother   . Irritable bowel syndrome Mother   . Hypertension Mother   . Hyperlipidemia Mother   . Hypothyroidism Mother   . Diabetes Maternal Uncle   . Heart disease Maternal Grandmother   . Leukemia Other     maternal greatgrandmother  . Colon cancer Neg Hx     Social History:  reports that he has been smoking Cigarettes.  He has a 8.00 pack-year smoking history. He has never used smokeless tobacco. He reports that he drinks about 0.6 oz of alcohol per week . He reports that he uses drugs, including Cocaine, Benzodiazepines, and Oxycodone, about 2 times per week.  Allergies:  Allergies  Allergen Reactions  . Acetaminophen Diarrhea    Flares up crohns disease  . Other     IV CONTRAST  . Vicodin [Hydrocodone-Acetaminophen] Nausea  And Vomiting  . Dilaudid [Hydromorphone Hcl] Hives and Rash    Medications:  Scheduled: . fentaNYL (SUBLIMAZE) injection  100 mcg Intravenous Once    Results for orders placed or performed during the hospital encounter of 11/04/16 (from the past 48 hour(s))  Comprehensive metabolic panel     Status: Abnormal   Collection Time: 11/04/16  3:00 PM  Result Value Ref Range   Sodium 135 135 - 145 mmol/L   Potassium 3.4 (L) 3.5 - 5.1 mmol/L   Chloride 103 101 - 111 mmol/L   CO2 23 22 - 32 mmol/L   Glucose, Bld 120 (H) 65 - 99 mg/dL   BUN 11 6 - 20 mg/dL   Creatinine, Ser 1.19 0.61 - 1.24 mg/dL   Calcium 8.6 (L) 8.9 - 10.3 mg/dL   Total Protein 8.6 (H) 6.5 - 8.1 g/dL   Albumin 3.6 3.5 - 5.0 g/dL   AST 45 (H) 15 - 41 U/L   ALT 59 17 - 63 U/L   Alkaline Phosphatase 60 38 - 126 U/L   Total Bilirubin 0.8 0.3 - 1.2 mg/dL   GFR calc non Af Amer >60 >60 mL/min   GFR calc Af Amer >60 >60 mL/min    Comment: (NOTE) The eGFR has been calculated using the CKD EPI equation. This calculation has not been validated in all clinical situations.  eGFR's persistently <60 mL/min signify possible Chronic Kidney Disease.    Anion gap 9 5 - 15  CBC with Differential     Status: Abnormal   Collection Time: 11/04/16  3:00 PM  Result Value Ref Range   WBC 19.6 (H) 4.0 - 10.5 K/uL   RBC 5.27 4.22 - 5.81 MIL/uL   Hemoglobin 15.2 13.0 - 17.0 g/dL   HCT 43.9 39.0 - 52.0 %   MCV 83.3 78.0 - 100.0 fL   MCH 28.8 26.0 - 34.0 pg   MCHC 34.6 30.0 - 36.0 g/dL   RDW 13.3 11.5 - 15.5 %   Platelets 297 150 - 400 K/uL   Neutrophils Relative % 80 %   Lymphocytes Relative 13 %   Monocytes Relative 7 %   Eosinophils Relative 0 %   Basophils Relative 0 %   Neutro Abs 15.7 (H) 1.7 - 7.7 K/uL   Lymphs Abs 2.5 0.7 - 4.0 K/uL   Monocytes Absolute 1.4 (H) 0.1 - 1.0 K/uL   Eosinophils Absolute 0.0 0.0 - 0.7 K/uL   Basophils Absolute 0.0 0.0 - 0.1 K/uL   WBC Morphology ATYPICAL LYMPHOCYTES     No results  found.  Review of Systems  All other systems reviewed and are negative.  Blood pressure 127/77, pulse 79, temperature 100.1 F (37.8 C), temperature source Oral, resp. rate 19, height 5' 11" (1.803 m), weight 76.2 kg (168 lb), SpO2 99 %. Physical Exam  Constitutional: He is oriented to person, place, and time. He appears well-developed and well-nourished.  HENT:  Head: Normocephalic and atraumatic.  Neck: Normal range of motion.  Cardiovascular: Normal rate.   Respiratory: Effort normal.  Musculoskeletal:       Right forearm: He exhibits tenderness, swelling and edema.  Right proximal forearm swelling, tenderness, and erythema.  Neurological: He is alert and oriented to person, place, and time.  Skin: Skin is warm. There is erythema.  Psychiatric: He has a normal mood and affect. His behavior is normal. Judgment and thought content normal.    Assessment/Plan: As above. Plan to admit to medical service and take patient to the operating room for incision and drainage of above abscess. I discussed with the patient the need for multiple incision and drainages if necessary.  Pedro Owens A 11/04/2016, 5:55 PM

## 2016-11-06 NOTE — Anesthesia Postprocedure Evaluation (Signed)
Anesthesia Post Note  Patient: Pedro Owens  Procedure(s) Performed: Procedure(s) (LRB): IRRIGATION AND DEBRIDEMENT EXTREMITY (Right)  Patient location during evaluation: PACU Anesthesia Type: General Level of consciousness: sedated and patient cooperative Pain management: pain level controlled Vital Signs Assessment: post-procedure vital signs reviewed and stable Respiratory status: spontaneous breathing Cardiovascular status: stable Anesthetic complications: no       Last Vitals:  Vitals:   11/06/16 1312 11/06/16 1337  BP: (!) 131/112 126/75  Pulse: 80   Resp: 19   Temp: 37.4 C     Last Pain:  Vitals:   11/06/16 1312  TempSrc: Oral  PainSc:                  [name]

## 2016-11-06 NOTE — Anesthesia Postprocedure Evaluation (Signed)
Anesthesia Post Note  Patient: Pedro Owens  Procedure(s) Performed: Procedure(s) (LRB): IRRIGATION AND DEBRIDEMENT right forarm (Right)  Patient location during evaluation: PACU Anesthesia Type: General Level of consciousness: sedated and patient cooperative Pain management: pain level controlled Vital Signs Assessment: post-procedure vital signs reviewed and stable Respiratory status: spontaneous breathing Cardiovascular status: stable Anesthetic complications: no       Last Vitals:  Vitals:   11/06/16 2005 11/06/16 2159  BP:  132/76  Pulse: 63 63  Resp: 18 16  Temp:  36.9 C    Last Pain:  Vitals:   11/06/16 2159  TempSrc: Oral  PainSc:                  [name]

## 2016-11-06 NOTE — Progress Notes (Addendum)
1550 Pt to pre op via bed. Consent signed. NPO after a light breakfast at 0840. Report given to [facility]. 1820 Received pt from PACU, lethargic, responsive to verbal stimuli. Right arm dressing with compression dressing dry and intact. Fingers are warm. Asleep.

## 2016-11-06 NOTE — Interval H&P Note (Signed)
History and Physical Interval Note:  11/06/2016 4:49 PM  Pedro Owens  has presented today for surgery, with the diagnosis of Wound Right Arm  The various methods of treatment have been discussed with the patient and family. After consideration of risks, benefits and other options for treatment, the patient has consented to  Procedure(s): IRRIGATION AND DEBRIDEMENT EXTREMITY (Right) as a surgical intervention .  The patient's history has been reviewed, patient examined, no change in status, stable for surgery.  I have reviewed the patient's chart and labs.  Questions were answered to the patient's satisfaction.     Dairl Ponder A

## 2016-11-06 NOTE — Transfer of Care (Signed)
Immediate Anesthesia Transfer of Care Note  Patient: Pedro Owens  Procedure(s) Performed: Procedure(s): IRRIGATION AND DEBRIDEMENT right forarm (Right)  Patient Location: PACU  Anesthesia Type:General  Level of Consciousness: awake  Airway & Oxygen Therapy: Patient Spontanous Breathing  Post-op Assessment: Report given to RN  Post vital signs: Reviewed and stable  Last Vitals:  Vitals:   11/06/16 1337 11/06/16 1758  BP: 126/75 111/85  Pulse:  61  Resp:  13  Temp:  36.4 C    Last Pain:  Vitals:   11/06/16 1312  TempSrc: Oral  PainSc:       Patients Stated Pain Goal: 2 (11/05/16 0315)  Complications: No apparent anesthesia complications

## 2016-11-07 ENCOUNTER — Encounter (HOSPITAL_COMMUNITY): Payer: Self-pay | Admitting: Orthopedic Surgery

## 2016-11-07 NOTE — Op Note (Signed)
NAMEROMAN, RUETHER NO.:  [id number]  MEDICAL RECORD NO.:  [id number]  LOCATION:  6N06C                        FACILITY:  [facility]  PHYSICIAN:  Artist Pais. Kirandeep Fariss, M.D.DATE OF BIRTH:  [birth year]  DATE OF PROCEDURE:  11/06/2016 DATE OF DISCHARGE:                              OPERATIVE REPORT   PREOPERATIVE DIAGNOSIS:  Deep infection, proximal aspect right forearm and medial aspect.  POSTOPERATIVE DIAGNOSIS:  Deep infection, proximal aspect right forearm and medial aspect.  PROCEDURE:  Repeat incision and drainage with secondary wound closure.  SURGEON:  Artist Pais. Mina Marble, M.D.  ASSISTANT:  None.  ANESTHESIA:  General.  COMPLICATION:  None.  DRAINS:  None.  DESCRIPTION OF PROCEDURE:  The patient was taken to the operating suite. After induction of adequate general anesthetic, right upper extremity was prepped and draped in sterile fashion.  Once this was done, we removed the 3-0 nylon sutures that I used to approximate the wound, removed the packing.  We thoroughly irrigated with 6 L normal saline. There was no evidence of recurrent infection.  We then loosely closed this with 3-0 nylon in interrupted vertical mattress sutures x6.  We then dressed with Xeroform, 4x4s, fluffs, and a compression bandage. The patient tolerated this procedure well and went to recovery in stable fashion.     Artist Pais Mina Marble, M.D.     MAW/MEDQ  D:  11/06/2016  T:  11/07/2016  Job:  [id number]

## 2016-11-07 NOTE — Progress Notes (Signed)
Pharmacy Antibiotic Note  Pedro Owens is a 26 y.o. male admitted on 11/04/2016 with right forearm cellulitis with abscess.  Pharmacy has been consulted for vancomycin and Zosyn dosing.  WBC is down to wnl. Afebrile.  MRSA PCR is negative and culture gm stain with GPC in chains and GNR. Culture re-incubated for better growth.   Plan: Continue Vancomycin 1g IV every 8 hours and Zosyn 3.375g IV every 8 hours for now.  Consider if vancomycin needed based on MRSA PCR and gram stain. Final culture results not yet back so will continue for now.   Height: 5\' 11"  (180.3 cm) Weight: 168 lb (76.2 kg) IBW/kg (Calculated) : 75.3  Temp (24hrs), Avg:98.3 F (36.8 C), Min:97.5 F (36.4 C), Max:99.3 F (37.4 C)   Recent Labs Lab 11/04/16 1500 11/05/16 0527 11/06/16 0617  WBC 19.6* 14.5* 10.1  CREATININE 1.19 1.05 1.06    Estimated Creatinine Clearance: 114.4 mL/min (by C-G formula based on SCr of 1.06 mg/dL).    Allergies  Allergen Reactions  . Acetaminophen Diarrhea    Flares up crohns disease  . Other     IV CONTRAST  . Vicodin [Hydrocodone-Acetaminophen] Nausea And Vomiting  . Dilaudid [Hydromorphone Hcl] Hives and Rash    Antimicrobials this admission:  Vanc 1/17 >>  Zosyn 1/17 >> Clindamycin 1/17 x1   Dose adjustments this admission:  N/a  Microbiology results:  1/17 BCx @ 1500: ngtd 1/17 BCx @ 2230: ngtd 1/17 R forearm abscess: abundant GPC chains, mod GNR 1/19 MRSA pcr negative  Thank you for allowing pharmacy to be a part of this patient's care.  Link Snuffer, PharmD, BCPS Clinical Pharmacist Clinical Phone 11/07/2016 until 3:30 PM [phone] After hours, please call #[phone] 11/07/2016 9:24 AM

## 2016-11-07 NOTE — Progress Notes (Signed)
PROGRESS NOTE    Pedro Owens  ZOX:[id number] DOB: [birth year] DOA: 11/04/2016 PCP: Diamantina Providence, FNP  Brief Narrative:Pedro Owens is a 26 y.o. male with history of HIV, IV drug abuse presented to the ER because of right forearm pain and swelling. S/p I&D   Assessment & Plan:  Right forearm abscess  -due to IVDA -status post incision and drainage 1/17 -prior h/o same in 06/2016 -continue  vancomycin and Zosyn, will follow up on culture, likely will get final report on 11/08/2016 -Last debridement on Friday, 11/06/2016  -pain control, initially patient was on Percocet, then change to OxyIR, I would change him back to Percocet. Decrease the frequency. Continue scheduled Toradol. scheduled Robaxin. Complaints about pain in the shoulder as well as his wrist. Occupational therapy consulted.  H/o IVDA -FU Blood cx -ongoing for 6months -counseled -CSW consulted  H/o HIV - last CD4 count in the system was around 790 ON 05/2016.  -followed by Dr.Campbell, continue HAART  Hypokalemia -replace  DVT prophylaxis: SCDs, add lovenox after next 1&D tomorrow Code Status: Full code.  Family Communication: Discussed with patient.  Disposition Plan: Home.    Consultants:   Dr.Weingold   Procedures:Incision and drainage of Right volar proximal forearm deep abscess 1/17, 11/06/2016   Antimicrobials: Vanc and Zosyn   Subjective: Pain well controlled, no nausea no vomiting. Constipation resolved.  Objective: Vitals:   11/06/16 2005 11/06/16 2159 11/07/16 0156 11/07/16 0521  BP:  132/76 117/67 (!) 107/59  Pulse: 63 63 (!) 55 61  Resp: 18 16 16 16   Temp:  98.4 F (36.9 C) 98.2 F (36.8 C) 98.5 F (36.9 C)  TempSrc:  Oral Oral Oral  SpO2: 100% 100% 100% 100%  Weight:      Height:        Intake/Output Summary (Last 24 hours) at 11/07/16 1345 Last data filed at 11/07/16 0832  Gross per 24 hour  Intake             2030 ml  Output              620 ml  Net              1410 ml   Filed Weights   11/04/16 1433  Weight: 76.2 kg (168 lb)    Examination:  General exam: AAOx3 Respiratory system: Clear to auscultation. Respiratory effort normal. Cardiovascular system: S1 & S2 heard, RRR. No JVD, murmurs, rubs, gallops or clicks. No pedal edema. Gastrointestinal system: Abdomen is nondistended, soft and nontender. No organomegaly or masses felt. Normal bowel sounds heard. Central nervous system: Alert and oriented. No focal neurological deficits. Extremities: R arm with swelling Skin: No rashes, lesions or ulcers Psychiatry: flat affect    Data Reviewed: I have personally reviewed following labs and imaging studies  CBC:  Recent Labs Lab 11/04/16 1500 11/05/16 0527 11/06/16 0617  WBC 19.6* 14.5* 10.1  NEUTROABS 15.7*  --   --   HGB 15.2 12.9* 12.1*  HCT 43.9 38.3* 36.1*  MCV 83.3 84.9 84.5  PLT 297 257 228   Basic Metabolic Panel:  Recent Labs Lab 11/04/16 1500 11/05/16 0527 11/06/16 0617  NA 135 139 138  K 3.4* 3.2* 3.3*  CL 103 108 107  CO2 23 26 26   GLUCOSE 120* 97 89  BUN 11 8 7   CREATININE 1.19 1.05 1.06  CALCIUM 8.6* 7.5* 7.9*   GFR: Estimated Creatinine Clearance: 114.4 mL/min (by C-G formula based on SCr of 1.06 mg/dL). Liver  Function Tests:  Recent Labs Lab 11/04/16 1500  AST 45*  ALT 59  ALKPHOS 60  BILITOT 0.8  PROT 8.6*  ALBUMIN 3.6   No results for input(s): LIPASE, AMYLASE in the last 168 hours. No results for input(s): AMMONIA in the last 168 hours. Coagulation Profile: No results for input(s): INR, PROTIME in the last 168 hours. Cardiac Enzymes: No results for input(s): CKTOTAL, CKMB, CKMBINDEX, TROPONINI in the last 168 hours. BNP (last 3 results) No results for input(s): PROBNP in the last 8760 hours. HbA1C: No results for input(s): HGBA1C in the last 72 hours. CBG: No results for input(s): GLUCAP in the last 168 hours. Lipid Profile: No results for input(s): CHOL, HDL, LDLCALC, TRIG,  CHOLHDL, LDLDIRECT in the last 72 hours. Thyroid Function Tests: No results for input(s): TSH, T4TOTAL, FREET4, T3FREE, THYROIDAB in the last 72 hours. Anemia Panel: No results for input(s): VITAMINB12, FOLATE, FERRITIN, TIBC, IRON, RETICCTPCT in the last 72 hours. Urine analysis:    Component Value Date/Time   COLORURINE YELLOW 06/30/2016 1500   APPEARANCEUR CLEAR 06/30/2016 1500   LABSPEC <1.005 (L) 06/30/2016 1500   PHURINE 6.0 06/30/2016 1500   GLUCOSEU NEGATIVE 06/30/2016 1500   HGBUR SMALL (A) 06/30/2016 1500   BILIRUBINUR NEGATIVE 06/30/2016 1500   KETONESUR NEGATIVE 06/30/2016 1500   PROTEINUR NEGATIVE 06/30/2016 1500   UROBILINOGEN 1.0 08/04/2015 1935   NITRITE NEGATIVE 06/30/2016 1500   LEUKOCYTESUR TRACE (A) 06/30/2016 1500   Sepsis Labs: @LABRCNTIP (procalcitonin:4,lacticidven:4)  ) Recent Results (from the past 240 hour(s))  Blood culture (routine x 2)     Status: None (Preliminary result)   Collection Time: 11/04/16  3:00 PM  Result Value Ref Range Status   Specimen Description BLOOD LEFT FOREARM  Final   Special Requests BOTTLES DRAWN AEROBIC AND ANAEROBIC 5CC  Final   Culture NO GROWTH 3 DAYS  Final   Report Status PENDING  Incomplete  Blood culture (routine x 2)     Status: None (Preliminary result)   Collection Time: 11/04/16  4:00 PM  Result Value Ref Range Status   Specimen Description BLOOD LEFT HAND  Final   Special Requests BOTTLES DRAWN AEROBIC AND ANAEROBIC 5CC  Final   Culture NO GROWTH 3 DAYS  Final   Report Status PENDING  Incomplete  Aerobic/Anaerobic Culture (surgical/deep wound)     Status: None (Preliminary result)   Collection Time: 11/04/16  7:08 PM  Result Value Ref Range Status   Specimen Description ABSCESS RIGHT FOREARM  Final   Special Requests NONE  Final   Gram Stain   Final    MODERATE WBC PRESENT,BOTH PMN AND MONONUCLEAR ABUNDANT GRAM POSITIVE COCCI IN CHAINS MODERATE GRAM NEGATIVE RODS    Culture   Final    CULTURE  REINCUBATED FOR BETTER GROWTH HOLDING FOR POSSIBLE ANAEROBE    Report Status PENDING  Incomplete  Culture, blood (routine x 2)     Status: None (Preliminary result)   Collection Time: 11/04/16 10:30 PM  Result Value Ref Range Status   Specimen Description BLOOD RIGHT HAND  Final   Special Requests IN PEDIATRIC BOTTLE 1CC  Final   Culture NO GROWTH 3 DAYS  Final   Report Status PENDING  Incomplete  Culture, blood (routine x 2)     Status: None (Preliminary result)   Collection Time: 11/04/16 10:30 PM  Result Value Ref Range Status   Specimen Description BLOOD LEFT HAND  Final   Special Requests IN PEDIATRIC BOTTLE 3CC  Final   Culture  NO GROWTH 3 DAYS  Final   Report Status PENDING  Incomplete  MRSA PCR Screening     Status: None   Collection Time: 11/06/16 10:45 AM  Result Value Ref Range Status   MRSA by PCR NEGATIVE NEGATIVE Final    Comment:        The GeneXpert MRSA Assay (FDA approved for NASAL specimens only), is one component of a comprehensive MRSA colonization surveillance program. It is not intended to diagnose MRSA infection nor to guide or monitor treatment for MRSA infections.          Radiology Studies: No results found.      Scheduled Meds: . abacavir-dolutegravir-lamiVUDine  1 tablet Oral Daily  . ketorolac  30 mg Intravenous Q6H  . methocarbamol  500 mg Oral TID  . piperacillin-tazobactam (ZOSYN)  IV  3.375 g Intravenous Q8H  . polyethylene glycol  17 g Oral Daily  . senna-docusate  1 tablet Oral BID  . vancomycin  1,000 mg Intravenous Q8H   Continuous Infusions:    LOS: 3 days    Time spent:    Author:  Lynden Oxford, MD [facility] Pager: [phone] 11/07/2016 1:45 PM     If 7PM-7AM, please contact night-coverage [email or web address] Password [email or web address]

## 2016-11-07 NOTE — Progress Notes (Signed)
Transferred patient from 6n06 to 6n04 due to overhead light not cutting off. Electrician said he would need to be out of room to fix. Will stay in this room for remainder of stay.

## 2016-11-07 NOTE — Progress Notes (Addendum)
Occupational Therapy Evaluation Patient Details Name: Pedro Owens MRN: [id number] DOB: [birth year] Today's Date: 11/07/2016    History of Present Illness 26 yo male s/p I& D of R forearm by Dr Mina Marble for cellulitis with abscess. Past Medical History:  Diagnosis Date  . ADHD (attention deficit hyperactivity disorder)   . Anal warts   . Anxiety   . Bipolar disorder ([facility])   . Chronic bronchitis ([facility])   . Chronic pain syndrome   . Convulsions ([facility]) 08/14/2015  . Depression   . Gonorrhea 09/08/11  . Headache    "weekly" (11/06/2016)  . Hemorrhoids, internal, with bleeding 08/25/2011  . HIV infection ([facility])   . Hypertension   . ILEITIS 09/24/2010   Qualifier: Diagnosis of  By: Nelson-Smith CMA (AAMA), Dottie    . Lymphoid hyperplasia, reactive   . Marijuana abuse   . Migraine    "monthly" (11/06/2016)  . Pseudoseizures   . Seizures ([facility])    "don't know why I have them" (11/06/2016)      Clinical Impression   Patient evaluated by Occupational Therapy with no further acute OT needs identified. All education has been completed and the patient has no further questions. See below for any follow-up Occupational Therapy or equipment needs. OT to sign off. Thank you for referral.  Pt able to verbalize follow up correctly and encouraged to make sure to attend appointment. Pt expressed with return demonstration exercises.      Follow Up Recommendations  No OT follow up    Equipment Recommendations  None recommended by OT    Recommendations for Other Services       Precautions / Restrictions Precautions Precautions: None      Mobility Bed Mobility Overal bed mobility: Modified Independent                Transfers                      Balance                                            ADL                                               Vision     Perception     Praxis      Pertinent Vitals/Pain Pain  Assessment: Faces Faces Pain Scale: Hurts little more Pain Location: R arm Pain Descriptors / Indicators: Sore Pain Intervention(s): Monitored during session;Premedicated before session;Repositioned     Hand Dominance Right   Extremity/Trunk Assessment Upper Extremity Assessment Upper Extremity Assessment: RUE deficits/detail RUE Deficits / Details: full ROM currently and able to functionally use R hand           Communication Communication Communication: No difficulties   Cognition Arousal/Alertness: Awake/alert Behavior During Therapy: WFL for tasks assessed/performed Overall Cognitive Status: Within Functional Limits for tasks assessed                     General Comments       Exercises Exercises: Other exercises Other Exercises Other Exercises: pt provides scapula retraction, elevation and depression exercises bed / chair levelx 5 each Other Exercises: pt attempting chair level yoga position  with scapula depression, elbow extension, hip flexion and neck extension - pt unable to hold and sustain position but attempted 5 times  Other Exercises: pt reports soreness but stretching helped   Shoulder Instructions      Home Living Family/patient expects to be discharged to:: Private residence Living Arrangements: Non-relatives/Friends Available Help at Discharge: Available PRN/intermittently                                    Prior Functioning/Environment Level of Independence: Independent                 OT Problem List:     OT Treatment/Interventions:      OT Goals(Current goals can be found in the care plan section)    OT Frequency:     Barriers to D/C:            Co-evaluation              End of Session    Activity Tolerance: Patient tolerated treatment well Patient left: in bed;with call bell/phone within reach;with bed alarm set   Time: 8527-7824 OT Time Calculation (min): 12 min Charges:  OT General  Charges $OT Visit: 1 Procedure OT Evaluation $OT Eval Low Complexity: 1 Procedure G-Codes:    Pedro Owens Nov 15, 2016, 10:40 AM  Mateo Flow   OTR/L Pager: [phone] Office: [phone] .

## 2016-11-07 NOTE — Progress Notes (Signed)
Patient seen and examined at bedside this morning. Patient's pain is significantly decreased. At this point I'm recommending DC on oral antibiotics when his final cultures are available. I will need to see the patient in my office this Thursday for dressing change.

## 2016-11-08 MED ORDER — DOXYCYCLINE HYCLATE 100 MG PO TABS
100.0000 mg | ORAL_TABLET | Freq: Two times a day (BID) | ORAL | Status: DC
  Administered 2016-11-08 – 2016-11-09 (×3): 100 mg via ORAL
  Filled 2016-11-08 (×3): qty 1

## 2016-11-08 MED ORDER — AMOXICILLIN-POT CLAVULANATE 875-125 MG PO TABS
1.0000 | ORAL_TABLET | Freq: Two times a day (BID) | ORAL | Status: DC
  Administered 2016-11-08 – 2016-11-09 (×3): 1 via ORAL
  Filled 2016-11-08 (×3): qty 1

## 2016-11-08 MED ORDER — ENOXAPARIN SODIUM 40 MG/0.4ML ~~LOC~~ SOLN
40.0000 mg | SUBCUTANEOUS | Status: DC
  Administered 2016-11-08: 40 mg via SUBCUTANEOUS
  Filled 2016-11-08: qty 0.4

## 2016-11-08 NOTE — Progress Notes (Signed)
PROGRESS NOTE    Pedro Owens  Owens DOB: Feb 02, 1991 DOA: 11/04/2016 PCP: Diamantina Providence, FNP  Brief Narrative:Pedro Owens is a 26 y.o. male with history of HIV, IV drug abuse presented to the ER because of right forearm pain and swelling. S/p I&D   Assessment & Plan:  Right forearm abscess  -due to IVDA -status post incision and drainage 1/17 -prior h/o same in 06/2016 -continue  vancomycin and Zosyn, will follow up on culture, likely will get final report on 11/09/2016 -Gram stain showing ABUNDANT GRAM POSITIVE COCCI IN CHAINS  MODERATE GRAM NEGATIVE RODS  -Last debridement on Friday, 11/06/2016  -pain control, appears well at present Continue scheduled Toradol. scheduled Robaxin. Complaints about pain in the shoulder as well as his wrist. Occupational therapy consulted.  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  -followed by Dr.Campbell, continue HAART  Hypokalemia -Monitor  DVT prophylaxis:  Lovenox Code Status: Full code.  Family Communication: Discussed with patient.  Disposition Plan: Home.    Consultants:   Dr.Weingold   Procedures:Incision and drainage of Right volar proximal forearm deep abscess 1/17, 11/06/2016   Antimicrobials: Vanc and Zosyn   Subjective: Pain well controlled, tolerating oral diet, somewhat nausea at present.  Objective: Vitals:   11/07/16 0521 11/07/16 1410 11/07/16 2019 11/08/16 0533  BP: (!) 107/59 114/62 98/64 125/73  Pulse: 61 (!) 58 (!) 50 (!) 50  Resp: 16 16 19 15   Temp: 98.5 F (36.9 C) 98.6 F (37 C) 98 F (36.7 C) 98.5 F (36.9 C)  TempSrc: Oral Oral Oral Oral  SpO2: 100% 100% 100% 100%  Weight:      Height:        Intake/Output Summary (Last 24 hours) at 11/08/16 1413 Last data filed at 11/08/16 1013  Gross per 24 hour  Intake              680 ml  Output              700 ml  Net               -20 ml   Filed Weights   11/04/16 1433  Weight: 76.2 kg (168 lb)    Examination:  General exam: AAOx3 Respiratory system: Clear to auscultation. Respiratory effort normal. Cardiovascular system: S1 & S2 heard, RRR. No JVD, murmurs, rubs, gallops or clicks. No pedal edema. Gastrointestinal system: Abdomen is nondistended, soft and nontender. No organomegaly or masses felt. Normal bowel sounds heard. Central nervous system: Alert and oriented. No focal neurological deficits. Extremities: R arm with swelling Skin: No rashes, lesions or ulcers Psychiatry: flat affect    Data Reviewed: I have personally reviewed following labs and imaging studies  CBC:  Recent Labs Lab 11/04/16 1500 11/05/16 0527 11/06/16 0617  WBC 19.6* 14.5* 10.1  NEUTROABS 15.7*  --   --   HGB 15.2 12.9* 12.1*  HCT 43.9 38.3* 36.1*  MCV 83.3 84.9 84.5  PLT 297 257 228   Basic Metabolic Panel:  Recent Labs Lab 11/04/16 1500 11/05/16 0527 11/06/16 0617  NA 135 139 138  K 3.4* 3.2* 3.3*  CL 103 108 107  CO2 23 26 26   GLUCOSE 120* 97 89  BUN 11 8 7   CREATININE 1.19 1.05 1.06  CALCIUM 8.6* 7.5* 7.9*   GFR: Estimated Creatinine Clearance: 114.4 mL/min (by C-G formula based on SCr  of 1.06 mg/dL). Liver Function Tests:  Recent Labs Lab 11/04/16 1500  AST 45*  ALT 59  ALKPHOS 60  BILITOT 0.8  PROT 8.6*  ALBUMIN 3.6   No results for input(s): LIPASE, AMYLASE in the last 168 hours. No results for input(s): AMMONIA in the last 168 hours. Coagulation Profile: No results for input(s): INR, PROTIME in the last 168 hours. Cardiac Enzymes: No results for input(s): CKTOTAL, CKMB, CKMBINDEX, TROPONINI in the last 168 hours. BNP (last 3 results) No results for input(s): PROBNP in the last 8760 hours. HbA1C: No results for input(s): HGBA1C in the last 72 hours. CBG: No results for input(s): GLUCAP in the last 168 hours. Lipid Profile: No results for input(s): CHOL, HDL, LDLCALC, TRIG,  CHOLHDL, LDLDIRECT in the last 72 hours. Thyroid Function Tests: No results for input(s): TSH, T4TOTAL, FREET4, T3FREE, THYROIDAB in the last 72 hours. Anemia Panel: No results for input(s): VITAMINB12, FOLATE, FERRITIN, TIBC, IRON, RETICCTPCT in the last 72 hours. Urine analysis:    Component Value Date/Time   COLORURINE YELLOW 06/30/2016 1500   APPEARANCEUR CLEAR 06/30/2016 1500   LABSPEC <1.005 (L) 06/30/2016 1500   PHURINE 6.0 06/30/2016 1500   GLUCOSEU NEGATIVE 06/30/2016 1500   HGBUR SMALL (A) 06/30/2016 1500   BILIRUBINUR NEGATIVE 06/30/2016 1500   KETONESUR NEGATIVE 06/30/2016 1500   PROTEINUR NEGATIVE 06/30/2016 1500   UROBILINOGEN 1.0 08/04/2015 1935   NITRITE NEGATIVE 06/30/2016 1500   LEUKOCYTESUR TRACE (A) 06/30/2016 1500   Sepsis Labs: @LABRCNTIP (procalcitonin:4,lacticidven:4)  ) Recent Results (from the past 240 hour(s))  Blood culture (routine x 2)     Status: None (Preliminary result)   Collection Time: 11/04/16  3:00 PM  Result Value Ref Range Status   Specimen Description BLOOD LEFT FOREARM  Final   Special Requests BOTTLES DRAWN AEROBIC AND ANAEROBIC 5CC  Final   Culture NO GROWTH 3 DAYS  Final   Report Status PENDING  Incomplete  Blood culture (routine x 2)     Status: None (Preliminary result)   Collection Time: 11/04/16  4:00 PM  Result Value Ref Range Status   Specimen Description BLOOD LEFT HAND  Final   Special Requests BOTTLES DRAWN AEROBIC AND ANAEROBIC 5CC  Final   Culture NO GROWTH 3 DAYS  Final   Report Status PENDING  Incomplete  Aerobic/Anaerobic Culture (surgical/deep wound)     Status: None (Preliminary result)   Collection Time: 11/04/16  7:08 PM  Result Value Ref Range Status   Specimen Description ABSCESS RIGHT FOREARM  Final   Special Requests NONE  Final   Gram Stain   Final    MODERATE WBC PRESENT,BOTH PMN AND MONONUCLEAR ABUNDANT GRAM POSITIVE COCCI IN CHAINS MODERATE GRAM NEGATIVE RODS    Culture   Final    CULTURE  REINCUBATED FOR BETTER GROWTH HOLDING FOR POSSIBLE ANAEROBE    Report Status PENDING  Incomplete  Culture, blood (routine x 2)     Status: None (Preliminary result)   Collection Time: 11/04/16 10:30 PM  Result Value Ref Range Status   Specimen Description BLOOD RIGHT HAND  Final   Special Requests IN PEDIATRIC BOTTLE 1CC  Final   Culture NO GROWTH 3 DAYS  Final   Report Status PENDING  Incomplete  Culture, blood (routine x 2)     Status: None (Preliminary result)   Collection Time: 11/04/16 10:30 PM  Result Value Ref Range Status   Specimen Description BLOOD LEFT HAND  Final   Special Requests IN PEDIATRIC BOTTLE 3CC  Final   Culture NO GROWTH 3 DAYS  Final   Report Status PENDING  Incomplete  MRSA PCR Screening     Status: None   Collection Time: 11/06/16 10:45 AM  Result Value Ref Range Status   MRSA by PCR NEGATIVE NEGATIVE Final    Comment:        The GeneXpert MRSA Assay (FDA approved for NASAL specimens only), is one component of a comprehensive MRSA colonization surveillance program. It is not intended to diagnose MRSA infection nor to guide or monitor treatment for MRSA infections.          Radiology Studies: No results found.      Scheduled Meds: . abacavir-dolutegravir-lamiVUDine  1 tablet Oral Daily  . amoxicillin-clavulanate  1 tablet Oral Q12H  . doxycycline  100 mg Oral Q12H  . ketorolac  30 mg Intravenous Q6H  . methocarbamol  500 mg Oral TID  . polyethylene glycol  17 g Oral Daily  . senna-docusate  1 tablet Oral BID   Continuous Infusions:    LOS: 4 days    Time spent:    Author:  Lynden Oxford, MD [facility] Pager: [phone] 11/08/2016 2:13 PM     If 7PM-7AM, please contact night-coverage [email or web address] Password [email or web address]

## 2016-11-09 ENCOUNTER — Ambulatory Visit: Payer: BLUE CROSS/BLUE SHIELD | Admitting: Internal Medicine

## 2016-11-09 LAB — CULTURE, BLOOD (ROUTINE X 2)
CULTURE: NO GROWTH
CULTURE: NO GROWTH
Culture: NO GROWTH
Culture: NO GROWTH

## 2016-11-09 MED ORDER — NAPROXEN 500 MG PO TABS: 500.0000 mg | ORAL_TABLET | Freq: Three times a day (TID) | ORAL | 0 refills | Status: DC

## 2016-11-09 MED ORDER — FAMOTIDINE 20 MG PO TABS: 20.0000 mg | ORAL_TABLET | Freq: Every day | ORAL | 0 refills | Status: DC

## 2016-11-09 MED ORDER — OXYCODONE-ACETAMINOPHEN 5-325 MG PO TABS: 1.0000 | ORAL_TABLET | Freq: Three times a day (TID) | ORAL | 0 refills | Status: DC | PRN

## 2016-11-09 MED ORDER — AMOXICILLIN-POT CLAVULANATE 875-125 MG PO TABS: 1.0000 | ORAL_TABLET | Freq: Two times a day (BID) | ORAL | 0 refills | Status: AC

## 2016-11-09 MED ORDER — DOXYCYCLINE HYCLATE 100 MG PO TABS: 100.0000 mg | ORAL_TABLET | Freq: Two times a day (BID) | ORAL | 0 refills | Status: AC

## 2016-11-09 MED ORDER — POLYETHYLENE GLYCOL 3350 17 G PO PACK: 17.0000 g | PACK | Freq: Every day | ORAL | 0 refills | Status: DC

## 2016-11-09 MED ORDER — METHOCARBAMOL 500 MG PO TABS: 500.0000 mg | ORAL_TABLET | Freq: Three times a day (TID) | ORAL | 0 refills | Status: DC | PRN

## 2016-11-09 MED ORDER — SACCHAROMYCES BOULARDII 250 MG PO CAPS
250.0000 mg | ORAL_CAPSULE | Freq: Two times a day (BID) | ORAL | Status: DC
  Administered 2016-11-09: 250 mg via ORAL
  Filled 2016-11-09: qty 1

## 2016-11-09 MED ORDER — GUAIFENESIN-DM 100-10 MG/5ML PO SYRP: 5.0000 mL | ORAL_SOLUTION | Freq: Four times a day (QID) | ORAL | 0 refills | Status: DC | PRN

## 2016-11-09 MED ORDER — OXYCODONE-ACETAMINOPHEN 5-325 MG PO TABS: 1.0000 | ORAL_TABLET | Freq: Four times a day (QID) | ORAL | 0 refills | Status: DC | PRN

## 2016-11-09 MED ORDER — SENNOSIDES-DOCUSATE SODIUM 8.6-50 MG PO TABS: 1.0000 | ORAL_TABLET | Freq: Every day | ORAL | 0 refills | Status: DC

## 2016-11-09 MED ORDER — SACCHAROMYCES BOULARDII 250 MG PO CAPS: 250.0000 mg | ORAL_CAPSULE | Freq: Two times a day (BID) | ORAL | 0 refills | Status: DC

## 2016-11-11 LAB — AEROBIC/ANAEROBIC CULTURE W GRAM STAIN (SURGICAL/DEEP WOUND)

## 2016-11-11 LAB — AEROBIC/ANAEROBIC CULTURE (SURGICAL/DEEP WOUND)

## 2016-11-12 DIAGNOSIS — L089 Local infection of the skin and subcutaneous tissue, unspecified: Secondary | ICD-10-CM | POA: Insufficient documentation

## 2016-11-12 DIAGNOSIS — S60911A Unspecified superficial injury of right wrist, initial encounter: Secondary | ICD-10-CM

## 2016-11-12 DIAGNOSIS — S50901A Unspecified superficial injury of right elbow, initial encounter: Secondary | ICD-10-CM

## 2016-11-12 DIAGNOSIS — S50911A Unspecified superficial injury of right forearm, initial encounter: Secondary | ICD-10-CM

## 2016-11-14 NOTE — Discharge Summary (Signed)
[facility] Discharge Summary   Patient: Pedro Owens MRN:[id number]   PCP: Takela N Anderson, FNP DOB: 11/16/1990   Date of admission: 11/04/2016   Date of discharge: 11/09/2016    Discharge Diagnoses:  Principal Problem:   Abscess of forearm, right Active Problems:   HIV disease (HCC)   Polysubstance abuse   Wound infection   Abscess of right forearm   Admitted From: home Disposition:  home  Recommendations for Outpatient Follow-up:  1. Please follow up with PCP in week   Follow-up Information    WEINGOLD,MATTHEW A, MD Follow up on 11/12/2016.   Specialty:  Orthopedic Surgery Why:  For wound re-check Contact information: [address] [place] [address] [phone]        Takela N Anderson, FNP. Schedule an appointment as soon as possible for a visit in 1 week(s).   Specialty:  Nurse Practitioner Contact information: [address] [place] [address] [phone]          Diet recommendation: regular diet  Activity: The patient is advised to gradually reintroduce usual activities.  Discharge Condition: good  Code Status: full code  History of present illness: As per the H and P dictated on admission, "Pedro Owens is a 25 y.o. male with history of HIV, IV drug abuse presents to the ER because of right forearm pain and swelling. By the time I examined the patient, patient is already has had incision and drainage in the OR. Patient's right forearm was already dressed. Patient states over the last 1 week, patient has been having progressively worsening pain and swelling of the right forearm. Also has been having sensation of fever and chills. Admits to have injected IV drugs on the forearm."  Hospital Course:   Summary of his active problems in the hospital is as following. 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 scheduled Robaxin. Complaints about pain in the shoulder as well as his wrist. Occupational therapy consulted. Plan discuss with hand surgery, will get the pt home on oral Augmentin and doxycycline combination.  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  -followed by Dr.Campbell, continue HAART  Hypokalemia -Monitor  All other chronic medical condition were stable during the hospitalization.  Patient was ambulatory without any assistance. seen by occupational therapy, who recommended no further therapy. On the day of the discharge the patient's vitals were stable, and no other acute medical condition were reported by patient. the patient was felt safe to be discharge at home with family.  Procedures and Results:  Incision and drainage of Right volar proximal forearm deep abscess 1/17, 11/06/2016   Consultations:  Hand surgery   DISCHARGE MEDICATION: Discharge Medication List as of 11/09/2016 10:44 AM    START taking these medications   Details  amoxicillin-clavulanate (AUGMENTIN) 875-125 MG tablet Take 1 tablet by mouth every 12 (twelve) hours., Starting Mon 11/09/2016, Until Wed 11/18/2016, Normal    doxycycline (VIBRA-TABS) 100 MG tablet Take 1 tablet (100 mg total) by mouth every 12 (twelve) hours., Starting Mon 11/09/2016, Until Wed   11/18/2016, Normal    famotidine (PEPCID) 20 MG tablet Take 1 tablet (20 mg total) by mouth daily., Starting Mon 11/09/2016, Normal    guaiFENesin-dextromethorphan (ROBITUSSIN DM) 100-10 MG/5ML syrup Take 5 mLs by mouth every 6 (six) hours as needed for cough., Starting Mon 11/09/2016, Normal      methocarbamol (ROBAXIN) 500 MG tablet Take 1 tablet (500 mg total) by mouth every 8 (eight) hours as needed for muscle spasms., Starting Mon 11/09/2016, Normal    naproxen (NAPROSYN) 500 MG tablet Take 1 tablet (500 mg total) by mouth 3 (three) times daily with meals., Starting Mon 11/09/2016, Normal    polyethylene glycol (MIRALAX / GLYCOLAX) packet Take 17 g by mouth daily., Starting Mon 11/09/2016, Normal    saccharomyces boulardii (FLORASTOR) 250 MG capsule Take 1 capsule (250 mg total) by mouth 2 (two) times daily., Starting Mon 11/09/2016, Normal    senna-docusate (SENOKOT-S) 8.6-50 MG tablet Take 1 tablet by mouth at bedtime., Starting Mon 11/09/2016, Normal      CONTINUE these medications which have CHANGED   Details  oxyCODONE-acetaminophen (PERCOCET/ROXICET) 5-325 MG tablet Take 1 tablet by mouth every 8 (eight) hours as needed for moderate pain or severe pain., Starting Mon 11/09/2016, Print      CONTINUE these medications which have NOT CHANGED   Details  abacavir-dolutegravir-lamiVUDine (TRIUMEQ) 600-50-300 MG tablet Take 1 tablet by mouth daily., Starting Thu 04/16/2016, Normal    desipramine (NORPRAMIN) 25 MG tablet Take 1 tablet (25 mg total) by mouth at bedtime., Starting Wed 05/06/2016, Normal    Na Sulfate-K Sulfate-Mg Sulf (SUPREP BOWEL PREP KIT) 17.5-3.13-1.6 GM/180ML SOLN Use as directed, Normal    ondansetron (ZOFRAN) 4 MG tablet Take 1 tablet (4 mg total) by mouth every 4 (four) hours as needed for nausea., Starting Fri 07/03/2016, Normal      STOP taking these medications     amoxicillin (AMOXIL) 500 MG capsule        Allergies  Allergen Reactions  . Acetaminophen Diarrhea    Flares up crohns disease  . Other     IV CONTRAST  . Vicodin [Hydrocodone-Acetaminophen] Nausea And Vomiting  . Dilaudid [Hydromorphone Hcl] Hives and Rash   Discharge Instructions    Diet general    Complete by:  As directed    Discharge instructions    Complete by:  As directed     It is important that you read following instructions as well as go over your medication list with RN to help you understand your care after this hospitalization.  Discharge Instructions:  Please follow-up with PCP in one week  Please request your primary care physician to go over all Hospital Tests and Procedure/Radiological results at the follow up,  Please get all Hospital records sent to your PCP by signing hospital release before you go home.   Do not drive, operating heavy machinery, perform activities at heights, swimming or participation in water activities or provide baby sitting services; until you have been seen by Primary Care Physician or a Neurologist and advised to do so again. Do not take more than prescribed Pain, Sleep and Anxiety Medications. You were cared for by a hospitalist during your hospital stay. If you have any questions about your discharge medications or the care you received while you were in the hospital after you are discharged, you can call the unit and ask to speak with the hospitalist on call if the hospitalist that took care of you is not available.  Once you are discharged,  your primary care physician will handle any further medical issues. Please note that NO REFILLS for any discharge medications will be authorized once you are discharged, as it is imperative that you return to your primary care physician (or establish a relationship with a primary care physician if you do not have one) for your aftercare needs so that they can reassess your need for medications and monitor your lab values. You Must read complete instructions/literature along with all the possible adverse reactions/side effects for all the Medicines you take and that have been prescribed to you. Take any new Medicines after you have completely understood and accept all the possible adverse reactions/side effects. Wear Seat belts while driving. If you have smoked or chewed Tobacco in the last 2  yrs please stop smoking and/or stop any Recreational drug use.   Increase activity slowly    Complete by:  As directed      Discharge Exam: Filed Weights   11/04/16 1433  Weight: 76.2 kg (168 lb)   Vitals:   11/09/16 0408 11/09/16 1325  BP: 136/90 122/72  Pulse: (!) 56 (!) 49  Resp: 19   Temp: 98.3 F (36.8 C) 98.1 F (36.7 C)   General: Appear in no distress, no Rash; Oral Mucosa moist. Cardiovascular: S1 and S2 Present, no Murmur, no JVD Respiratory: Bilateral Air entry present and Clear to Auscultation, no Crackles, no wheezes Abdomen: Bowel Sound present, Soft and no tenderness Extremities: no Pedal edema, no calf tenderness Neurology: Grossly no focal neuro deficit.  The results of significant diagnostics from this hospitalization (including imaging, microbiology, ancillary and laboratory) are listed below for reference.    Significant Diagnostic Studies: No results found.  Microbiology: Recent Results (from the past 240 hour(s))  Aerobic/Anaerobic Culture (surgical/deep wound)     Status: None   Collection Time: 11/04/16  7:08 PM  Result Value Ref Range Status   Specimen Description ABSCESS RIGHT FOREARM  Final   Special Requests NONE  Final   Gram Stain   Final    MODERATE WBC PRESENT,BOTH PMN AND MONONUCLEAR ABUNDANT GRAM POSITIVE COCCI IN CHAINS MODERATE GRAM NEGATIVE RODS    Culture   Final    FEW EIKENELLA CORRODENS MODERATE PREVOTELLA BUCCAE BETA LACTAMASE NEGATIVE    Report Status 11/11/2016 FINAL  Final  Culture, blood (routine x 2)     Status: None   Collection Time: 11/04/16 10:30 PM  Result Value Ref Range Status   Specimen Description BLOOD RIGHT HAND  Final   Special Requests IN PEDIATRIC BOTTLE 1CC  Final   Culture NO GROWTH 5 DAYS  Final   Report Status 11/09/2016 FINAL  Final  Culture, blood (routine x 2)     Status: None   Collection Time: 11/04/16 10:30 PM  Result Value Ref Range Status   Specimen Description BLOOD LEFT HAND  Final    Special Requests IN PEDIATRIC BOTTLE 3CC  Final   Culture NO GROWTH 5 DAYS  Final   Report Status 11/09/2016 FINAL  Final  MRSA PCR Screening     Status: None   Collection Time: 11/06/16 10:45 AM  Result Value Ref Range Status   MRSA by PCR NEGATIVE NEGATIVE Final    Comment:        The GeneXpert MRSA Assay (FDA approved for NASAL specimens only), is one component of a comprehensive MRSA colonization surveillance program. It is not intended to diagnose MRSA infection nor to guide or monitor treatment for MRSA infections.       Time spent: 30 minutes  Signed:  ,   [facility] 11/09/2016 , 7:07 PM   

## 2016-12-08 ENCOUNTER — Encounter (HOSPITAL_COMMUNITY): Payer: Self-pay | Admitting: Emergency Medicine

## 2016-12-08 ENCOUNTER — Emergency Department (HOSPITAL_COMMUNITY)
Admission: EM | Admit: 2016-12-08 | Discharge: 2016-12-08 | Disposition: A | Discharge status: Home / Self Care | Payer: BLUE CROSS/BLUE SHIELD | Attending: Emergency Medicine | Admitting: Emergency Medicine

## 2016-12-08 DIAGNOSIS — L03114 Cellulitis of left upper limb: Secondary | ICD-10-CM | POA: Diagnosis not present

## 2016-12-08 DIAGNOSIS — I10 Essential (primary) hypertension: Secondary | ICD-10-CM | POA: Insufficient documentation

## 2016-12-08 DIAGNOSIS — F909 Attention-deficit hyperactivity disorder, unspecified type: Secondary | ICD-10-CM | POA: Diagnosis not present

## 2016-12-08 DIAGNOSIS — F17210 Nicotine dependence, cigarettes, uncomplicated: Secondary | ICD-10-CM | POA: Diagnosis not present

## 2016-12-08 DIAGNOSIS — L089 Local infection of the skin and subcutaneous tissue, unspecified: Secondary | ICD-10-CM | POA: Diagnosis present

## 2016-12-08 LAB — COMPREHENSIVE METABOLIC PANEL
ALBUMIN: 3.5 g/dL (ref 3.5–5.0)
ALT: 34 U/L (ref 17–63)
ANION GAP: 7 (ref 5–15)
AST: 57 U/L — ABNORMAL HIGH (ref 15–41)
Alkaline Phosphatase: 59 U/L (ref 38–126)
BUN: 17 mg/dL (ref 6–20)
CO2: 27 mmol/L (ref 22–32)
Calcium: 9.2 mg/dL (ref 8.9–10.3)
Chloride: 107 mmol/L (ref 101–111)
Creatinine, Ser: 0.98 mg/dL (ref 0.61–1.24)
GFR calc non Af Amer: >60 mL/min (ref >60)
GLUCOSE: 128 mg/dL — AB (ref 65–99)
POTASSIUM: 3.1 mmol/L — AB (ref 3.5–5.1)
SODIUM: 141 mmol/L (ref 135–145)
Total Bilirubin: 0.6 mg/dL (ref 0.3–1.2)
Total Protein: 7.7 g/dL (ref 6.5–8.1)

## 2016-12-08 LAB — CBC WITH DIFFERENTIAL/PLATELET
BASOS ABS: 0 10*3/uL (ref 0.0–0.1)
Basophils Relative: 0 %
Eosinophils Absolute: 0.3 10*3/uL (ref 0.0–0.7)
Eosinophils Relative: 4 %
HEMATOCRIT: 38.3 % — AB (ref 39.0–52.0)
HEMOGLOBIN: 13 g/dL (ref 13.0–17.0)
LYMPHS PCT: 22 %
Lymphs Abs: 1.5 10*3/uL (ref 0.7–4.0)
MCH: 27.7 pg (ref 26.0–34.0)
MCHC: 33.9 g/dL (ref 30.0–36.0)
MCV: 81.5 fL (ref 78.0–100.0)
MONO ABS: 0.3 10*3/uL (ref 0.1–1.0)
MONOS PCT: 5 %
NEUTROS ABS: 4.7 10*3/uL (ref 1.7–7.7)
NEUTROS PCT: 69 %
Platelets: 354 10*3/uL (ref 150–400)
RBC: 4.7 MIL/uL (ref 4.22–5.81)
RDW: 13 % (ref 11.5–15.5)
WBC: 6.8 10*3/uL (ref 4.0–10.5)

## 2016-12-08 LAB — I-STAT CG4 LACTIC ACID, ED: LACTIC ACID, VENOUS: 2.14 mmol/L — AB (ref 0.5–1.9)

## 2016-12-08 MED ORDER — DOXYCYCLINE HYCLATE 100 MG PO CAPS: 100.0000 mg | ORAL_CAPSULE | Freq: Two times a day (BID) | ORAL | 0 refills | Status: AC

## 2016-12-08 MED ORDER — CEPHALEXIN 500 MG PO CAPS
500.0000 mg | ORAL_CAPSULE | Freq: Once | ORAL | Status: AC
  Administered 2016-12-08: 500 mg via ORAL
  Filled 2016-12-08: qty 1

## 2016-12-08 MED ORDER — CEPHALEXIN 500 MG PO CAPS: 500.0000 mg | ORAL_CAPSULE | Freq: Four times a day (QID) | ORAL | 0 refills | Status: AC

## 2016-12-08 NOTE — ED Provider Notes (Signed)
[facility] DEPT Provider Note   CSN: [id number] Arrival date & time: 12/08/16  1555     History   Chief Complaint Chief Complaint  Patient presents with  . arm infection    HPI Pedro Owens is a 26 y.o. male.  The history is provided by the patient.  Abscess  Location:  Shoulder/arm Shoulder/arm abscess location:  L forearm Size:  2 cm Abscess quality: draining, induration, redness and warmth   Abscess quality comment:  Opened Duration:  1 week Progression:  Improving Chronicity:  New Context: injected drug use   Relieved by: doxycycline and abscess rupture. Worsened by:  Nothing Ineffective treatments:  None tried Associated symptoms: no fever   Risk factors: prior abscess     Past Medical History:  Diagnosis Date  . ADHD (attention deficit hyperactivity disorder)   . Anal warts   . Anxiety   . Bipolar disorder ([place])   . Chronic bronchitis ([place])   . Chronic pain syndrome   . Convulsions ([place]) 08/14/2015  . Depression   . Gonorrhea 09/08/11  . Headache    "weekly" (11/06/2016)  . Hemorrhoids, internal, with bleeding 08/25/2011  . HIV infection ([place])   . Hypertension   . ILEITIS 09/24/2010   Qualifier: Diagnosis of  By: Nelson-Smith CMA (AAMA), Dottie    . Lymphoid hyperplasia, reactive   . Marijuana abuse   . Migraine    "monthly" (11/06/2016)  . Pseudoseizures   . Seizures ([place])    "don't know why I have them" (11/06/2016)    Patient Active Problem List   Diagnosis Date Noted  . Wound infection 11/04/2016  . Abscess of forearm, right 11/04/2016  . Abscess of right forearm 11/04/2016  . Streptococcus infection, group C 06/30/2016  . Back pain 06/30/2016  . AKI (acute kidney injury) ([place]) 06/29/2016  . Postoperative anemia due to acute blood loss 06/28/2016  . Cellulitis of right forearm 06/26/2016  . Right forearm pain   . HIV disease ([place]) 04/07/2016  . Cigarette smoker 04/07/2016  . Polysubstance abuse 04/07/2016  . Depression 08/14/2015   . Conversion disorder 08/14/2015  . Hemorrhoids, internal, with bleeding 08/25/2011  . Anal warts 08/25/2011  . ANXIETY 09/24/2010  . ADHD 09/24/2010  . Constipation 09/24/2010  . 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 ANKLE SURGERY Bilateral 2005   Screw & Pins  . ANKLE SURGERY Right 2008   "replaced pins & screws"  . FOOT SURGERY Bilateral 2005   "bone spurs"  . I&D EXTREMITY Right 06/26/2016   Procedure: IRRIGATION AND DEBRIDEMENT RIGHT FOREARM;  Surgeon: Leanora Cover, MD;  Location: [facility];  Service: Orthopedics;  Laterality: Right;  . I&D EXTREMITY Right 11/04/2016   Procedure: IRRIGATION AND DEBRIDEMENT EXTREMITY;  Surgeon: Charlotte Crumb, MD;  Location: [facility];  Service: Orthopedics;  Laterality: Right;  . I&D EXTREMITY Right 11/06/2016   Procedure: IRRIGATION AND DEBRIDEMENT right forarm;  Surgeon: Charlotte Crumb, MD;  Location: [facility];  Service: Orthopedics;  Laterality: Right;       Home Medications    Prior to Admission medications   Medication Sig Start Date End Date Taking? Authorizing Provider  abacavir-dolutegravir-lamiVUDine (TRIUMEQ) 600-50-300 MG tablet Take 1 tablet by mouth daily. 04/16/16  Yes Michel Bickers, MD  amoxicillin (AMOXIL) 875 MG tablet Take 875 mg by mouth 2 (two) times daily. ABT Start Date 12/05/16 & End Date 12/12/16.   Yes Historical Provider, MD  doxycycline (VIBRA-TABS) 100 MG tablet Take  100 mg by mouth daily. ABT Start Date 12/05/16 & End Date 12/12/16. 12/05/16  Yes Historical Provider, MD  methocarbamol (ROBAXIN) 500 MG tablet Take 1 tablet (500 mg total) by mouth every 8 (eight) hours as needed for muscle spasms. 11/09/16  Yes Lavina Hamman, MD  naproxen (NAPROSYN) 500 MG tablet Take 1 tablet (500 mg total) by mouth 3 (three) times daily with meals. 11/09/16  Yes Lavina Hamman, MD  desipramine (NORPRAMIN) 25 MG tablet Take 1 tablet (25 mg total) by mouth at bedtime. Patient not taking: Reported  on 11/04/2016 05/06/16   Manus Gunning, MD  famotidine (PEPCID) 20 MG tablet Take 1 tablet (20 mg total) by mouth daily. Patient not taking: Reported on 12/08/2016 11/09/16   Lavina Hamman, MD  guaiFENesin-dextromethorphan (ROBITUSSIN DM) 100-10 MG/5ML syrup Take 5 mLs by mouth every 6 (six) hours as needed for cough. Patient not taking: Reported on 12/08/2016 11/09/16   Lavina Hamman, MD  Na Sulfate-K Sulfate-Mg Sulf (SUPREP BOWEL PREP KIT) 17.5-3.13-1.6 GM/180ML SOLN Use as directed Patient not taking: Reported on 11/04/2016 05/06/16   Manus Gunning, MD  ondansetron (ZOFRAN) 4 MG tablet Take 1 tablet (4 mg total) by mouth every 4 (four) hours as needed for nausea. Patient not taking: Reported on 11/04/2016 07/03/16   Verlee Monte, MD  oxyCODONE-acetaminophen (PERCOCET/ROXICET) 5-325 MG tablet Take 1 tablet by mouth every 8 (eight) hours as needed for moderate pain or severe pain. Patient not taking: Reported on 12/08/2016 11/09/16   Lavina Hamman, MD  polyethylene glycol [facility] / Floria Raveling) packet Take 17 g by mouth daily. Patient not taking: Reported on 12/08/2016 11/09/16   Lavina Hamman, MD  saccharomyces boulardii (FLORASTOR) 250 MG capsule Take 1 capsule (250 mg total) by mouth 2 (two) times daily. Patient not taking: Reported on 12/08/2016 11/09/16   Lavina Hamman, MD  senna-docusate (SENOKOT-S) 8.6-50 MG tablet Take 1 tablet by mouth at bedtime. Patient not taking: Reported on 12/08/2016 11/09/16   Lavina Hamman, MD    Family History Family History  Problem Relation Age of Onset  . Diabetes Mother   . Irritable bowel syndrome Mother   . Hypertension Mother   . Hyperlipidemia Mother   . Hypothyroidism Mother   . Diabetes Maternal Uncle   . Heart disease Maternal Grandmother   . Leukemia Other     maternal greatgrandmother  . Colon cancer Neg Hx     Social History Social History  Substance Use Topics  . Smoking status: Current Every Day Smoker    Packs/day: 1.00     Years: 11.00    Types: Cigarettes  . Smokeless tobacco: Never Used  . Alcohol use Yes     Comment: 11/06/2016 "nothing in a very long time"     Allergies   Acetaminophen; Other; Vicodin [hydrocodone-acetaminophen]; and Dilaudid [hydromorphone hcl]   Review of Systems Review of Systems  Constitutional: Negative for fever.  All other systems reviewed and are negative.    Physical Exam Updated Vital Signs BP 145/89 (BP Location: Right Arm)   Pulse 78   Temp 98.6 F (37 C) (Oral)   Resp 18   Ht '5\' 7"'$  (1.702 m)   Wt 160 lb (72.6 kg)   SpO2 99%   BMI 25.06 kg/m   Physical Exam  Constitutional: He is oriented to person, place, and time. He appears well-developed and well-nourished. No distress.  HENT:  Head: Normocephalic and atraumatic.  Nose: Nose normal.  Eyes:  Conjunctivae are normal.  Neck: Neck supple. No tracheal deviation present.  Cardiovascular: Normal rate, regular rhythm and normal heart sounds.   Pulmonary/Chest: Effort normal and breath sounds normal. No respiratory distress.  Abdominal: Soft. He exhibits no distension.  Musculoskeletal:  2cm abscess, ruptured over left proximal medial forearm as pictured. Induration extending 4 cm distally and proximally without erythema or fluctuance. No active drainage  Neurological: He is alert and oriented to person, place, and time.  Skin: Skin is warm and dry.  Psychiatric: He has a normal mood and affect.  Vitals reviewed.     Patient has hand behind arm so upper arm appears larger as pictured but is normal size.  ED Treatments / Results  Labs (all labs ordered are listed, but only abnormal results are displayed) Labs Reviewed  COMPREHENSIVE METABOLIC PANEL - Abnormal; Notable for the following:       Result Value   Potassium 3.1 (*)    Glucose, Bld 128 (*)    AST 57 (*)    All other components within normal limits  CBC WITH DIFFERENTIAL/PLATELET - Abnormal; Notable for the following:    HCT 38.3 (*)     All other components within normal limits  I-STAT CG4 LACTIC ACID, ED - Abnormal; Notable for the following:    Lactic Acid, Venous 2.14 (*)    All other components within normal limits    EKG  EKG Interpretation None       Radiology No results found.  Procedures Procedures (including critical care time)  EMERGENCY DEPARTMENT US SOFT TISSUE INTERPRETATION "Study: Limited Soft Tissue Ultrasound"  INDICATIONS: Soft tissue infection Multiple views of the body part were obtained in real-time with a multi-frequency linear probe  PERFORMED BY: Myself IMAGES ARCHIVED?: Yes SIDE:Left BODY PART:Upper extremity INTERPRETATION:  No abcess noted and Cellulitis present   Medications Ordered in ED Medications  cephALEXin (KEFLEX) capsule 500 mg (500 mg Oral Given 12/08/16 1922)     Initial Impression / Assessment and Plan / ED Course  I have reviewed the triage vital signs and the nursing notes.  Pertinent labs & imaging results that were available during my care of the patient were reviewed by me and considered in my medical decision making (see chart for details).     26 y.o. male presents with arm swelling and pain with area of induration. He started taking doxycycline that he was previously prescribed and the largest area ruptured and he improved slightly. He presents for reassessment. Will broaden with cephalosporin and extend doxycyline course. High risk with IVDU but no persistent abscess identified and no indication for surgical intervention. He has seen hand surgery previously and I recommended to see in f/u or return to ED with worsening but appears abscess has ruptured and secondary cellulitis improving.    Final Clinical Impressions(s) / ED Diagnoses   Final diagnoses:  Cellulitis of left arm    New Prescriptions New Prescriptions   No medications on file     Leo Grosser, MD 12/10/16 [phone]

## 2016-12-08 NOTE — ED Triage Notes (Signed)
Pt comes from home for left arm infection x week. patient has abscess on left elbow. Patient recently had surgery on right arm and was able to take antibiotics due to not able to afford them.  Vitals: 146/82, 80HR, 98% CBG 79, 97.7 temp.

## 2016-12-08 NOTE — ED Notes (Addendum)
Spoke to pt's mother, pt gave verbal consent to give his mother his information.  Also made him aware that his mother wants him to call her

## 2016-12-30 ENCOUNTER — Other Ambulatory Visit: Payer: Self-pay | Admitting: *Deleted

## 2016-12-30 DIAGNOSIS — B20 Human immunodeficiency virus [HIV] disease: Secondary | ICD-10-CM

## 2016-12-30 MED ORDER — ABACAVIR-DOLUTEGRAVIR-LAMIVUD 600-50-300 MG PO TABS: 1.0000 | ORAL_TABLET | Freq: Every day | ORAL | 0 refills | Status: DC

## 2017-01-06 ENCOUNTER — Encounter (INDEPENDENT_AMBULATORY_CARE_PROVIDER_SITE_OTHER): Payer: Self-pay

## 2017-01-06 ENCOUNTER — Other Ambulatory Visit: Payer: BLUE CROSS/BLUE SHIELD

## 2017-01-06 ENCOUNTER — Other Ambulatory Visit: Payer: Self-pay | Admitting: Internal Medicine

## 2017-01-06 DIAGNOSIS — B20 Human immunodeficiency virus [HIV] disease: Secondary | ICD-10-CM

## 2017-01-06 DIAGNOSIS — Z79899 Other long term (current) drug therapy: Secondary | ICD-10-CM

## 2017-01-06 DIAGNOSIS — Z113 Encounter for screening for infections with a predominantly sexual mode of transmission: Secondary | ICD-10-CM

## 2017-01-06 LAB — COMPREHENSIVE METABOLIC PANEL
ALBUMIN: 3.7 g/dL (ref 3.6–5.1)
ALT: 46 U/L (ref 9–46)
AST: 45 U/L — ABNORMAL HIGH (ref 10–40)
Alkaline Phosphatase: 67 U/L (ref 40–115)
BUN: 14 mg/dL (ref 7–25)
CHLORIDE: 104 mmol/L (ref 98–110)
CO2: 28 mmol/L (ref 20–31)
Calcium: 9 mg/dL (ref 8.6–10.3)
Creat: 0.87 mg/dL (ref 0.60–1.35)
Glucose, Bld: 86 mg/dL (ref 65–99)
POTASSIUM: 3.8 mmol/L (ref 3.5–5.3)
Sodium: 138 mmol/L (ref 135–146)
TOTAL PROTEIN: 7.6 g/dL (ref 6.1–8.1)
Total Bilirubin: 0.3 mg/dL (ref 0.2–1.2)

## 2017-01-06 LAB — CBC
HCT: 39.5 % (ref 38.5–50.0)
Hemoglobin: 12.9 g/dL — ABNORMAL LOW (ref 13.2–17.1)
MCH: 27.3 pg (ref 27.0–33.0)
MCHC: 32.7 g/dL (ref 32.0–36.0)
MCV: 83.7 fL (ref 80.0–100.0)
MPV: 9.1 fL (ref 7.5–12.5)
PLATELETS: 342 10*3/uL (ref 140–400)
RBC: 4.72 MIL/uL (ref 4.20–5.80)
RDW: 14 % (ref 11.0–15.0)
WBC: 7 10*3/uL (ref 3.8–10.8)

## 2017-01-06 LAB — LIPID PANEL
CHOL/HDL RATIO: 2.5 ratio (ref <5.0)
CHOLESTEROL: 97 mg/dL (ref <200)
HDL: 39 mg/dL — ABNORMAL LOW (ref >40)
LDL Cholesterol: 35 mg/dL (ref <100)
TRIGLYCERIDES: 114 mg/dL (ref <150)
VLDL: 23 mg/dL (ref <30)

## 2017-01-07 LAB — T-HELPER CELL (CD4) - (RCID CLINIC ONLY)
CD4 T CELL ABS: 800 /uL (ref 400–2700)
CD4 T CELL HELPER: 31 % — AB (ref 33–55)

## 2017-01-07 LAB — RPR

## 2017-01-08 ENCOUNTER — Telehealth: Payer: Self-pay | Admitting: *Deleted

## 2017-01-08 ENCOUNTER — Other Ambulatory Visit: Payer: Self-pay | Admitting: Internal Medicine

## 2017-01-08 DIAGNOSIS — B20 Human immunodeficiency virus [HIV] disease: Secondary | ICD-10-CM

## 2017-01-08 LAB — HIV-1 RNA QUANT-NO REFLEX-BLD
HIV 1 RNA QUANT: 2050 {copies}/mL — AB
HIV-1 RNA Quant, Log: 3.31 Log copies/mL — ABNORMAL HIGH

## 2017-01-08 NOTE — Telephone Encounter (Signed)
As it was Friday afternoon, RN called Loney Loh directly to add this lab order. The order placed in EPIC will need to be cancelled. RN reached out to patient to see if he has been taking his medication (his appointment on 4/3 has "needs refills" in the notes section), but had to leave a generic message asking him to call his doctor's office back regarding labs and medication. Andree Coss, RN  Cliffton Asters, MD  [facility] Triage Nurse Pool        Tobin's VL is elevated. I ordered a genotype. Please ask Clydie Braun to add it to his recent bloodwork. Thanks.   Jonny Ruiz

## 2017-01-19 ENCOUNTER — Ambulatory Visit: Payer: BLUE CROSS/BLUE SHIELD | Admitting: Internal Medicine

## 2017-01-20 ENCOUNTER — Telehealth: Payer: Self-pay | Admitting: *Deleted

## 2017-01-20 ENCOUNTER — Ambulatory Visit: Payer: BLUE CROSS/BLUE SHIELD | Admitting: Internal Medicine

## 2017-01-20 NOTE — Telephone Encounter (Signed)
Patient no-showed 4/3. RN called to notify him of his missed visit. His mother answered the call, stated he was working until 4am the night before, asked to reschedule the appointment. RN offered 4/4, patient's mother accepted, stated she would get him here. Patient no-showed appointment 4/4.  He has been referred to [facility] counseling for follow up per Dr Orvan Falconer.  Patient is detectable, has not been seen other than his initial visit and 1 week follow up. Andree Coss, RN

## 2017-01-27 LAB — HIV-1 GENOTYPR PLUS

## 2017-05-14 ENCOUNTER — Telehealth: Payer: Self-pay | Admitting: *Deleted

## 2017-05-14 NOTE — Telephone Encounter (Signed)
Patient's Mom called stating that her son has recently [place] for heroin and cocaine use. He is back home with her now and is very tearful and wants to get in with a counselor as soon as possible. Sheduled with Cordelia Pen, [facility] counselor for Monday 05/17/17 at 2:30 pm.

## 2017-05-17 ENCOUNTER — Ambulatory Visit (INDEPENDENT_AMBULATORY_CARE_PROVIDER_SITE_OTHER): Payer: BLUE CROSS/BLUE SHIELD | Admitting: Licensed Clinical Social Worker

## 2017-05-17 DIAGNOSIS — F1510 Other stimulant abuse, uncomplicated: Secondary | ICD-10-CM

## 2017-05-17 DIAGNOSIS — F1121 Opioid dependence, in remission: Secondary | ICD-10-CM

## 2017-05-19 NOTE — Progress Notes (Signed)
Integrated Behavioral Health Initial Visit  MRN: [id number] Name: Pedro Owens   Session Start time: 2:58 pm Session End time: 4:05 pm Total time: 1 hour  Type of Service: Integrated Behavioral Health- Individual/Family Interpretor:No. Interpretor Name and Language: N/A   SUBJECTIVE: Pedro Owens is a 26 y.o. male accompanied by patient and mother. Patient was referred by Triage Nurse but is a patient of Dr. Orvan Falconer.  Patient reports the following symptoms/concerns: Heroin use that first began at age 59 as inhalation use, then progressed to IV use at age 65, using $100/day, with last use on 03/22/2017.  [name] ([name]) use beginning at age 80, initially starting with inhalation use, then progressing to IV use, with last use on 05/16/17.  Inhalation use of Percocet beginning at age 61, taking 5 pills at a time, with last use 4-6 months ago. Crack Cocaine use, beginning at age 89, using $20/day, with last use May 2018.  Patient denied use of sedatives and Alcohol and reported only smoking Marijuana "once in a blue moon", beginning at age 41.    Patient reported that he recently successfully [facility] [place], [place] and was admitted for 58 days.  Patient reported that he returned to [facility] on July 19th and was elevated initially upon his return.  Patient reported that he has been having drug cravings and has craved Heroin since being discharged and returning to [place].  Patient reported that he built a fort out of couch pillows to block himself in and keep him from purchasing and using Heroin.  Additionally patient reported that he began shooting water into veins after three days after being home.  Patient denied having a history of self-harm behaviors and self-mutilation and denied use of Heroin since discharge from treatment.  Patient reported use of [name] by inhalation on the weekends and presented with poor insight into how his [name] use and  injection of water is the gateway to him relapsing on Heroin. Patient's mother reported seeing habits and evidence that the patient is using susbatnces.  She states that people are finding needles filled with drugs and the patient is asking friends and family members for money.    Patient reported history of Bipolar Spectrum Disorder and has history of taking multiple medications, with current non-compliance.  Patient denied having recent or current suicidal ideations, plan, or intent.  Patient reported that he had a plan in the past to shoot bleach into his vein but did not act.    According to ASAM criteria, the patient has moderate risk for Dimension 1 for Intoxication due to continued [place] use, has moderate risk for Dimension 2 due to HIV status, has high risk for Dimension 3 due to untreated Bipolar Disorder, high risk for Dimension 4 as he is resistant to treatment currently, high risk in Dimension 5 as he has already relapsed on [name] and has high likelihood of relapsing on Heroin, and high risk in Dimension 5 due to his home environment where friends, family, and neighbors use various substances.  Patient denied currently having a sponsor, attending 12 step support groups, or actively working on his recovery.  Patient presented as resistant to entering treatment.  Duration of problem: 1 month; Severity of problem: moderate  OBJECTIVE: Behavior: Guarded and quiet Mood: Depressed and Irritable and Affect: Blunt Risk of harm to self or others: No plan to harm self or others  Thought process: coherent Thought content: logical   LIFE CONTEXT: Family  and Social: Patient lives with mother and other family members, some of which use substances. School/Work: Patient works long hours at [employer]. Self-Care: Patient is able to tend to his ADL's but has issues with injecting water in his veins. Life Changes: Patient has been experiencing drug cravings after completing inpatient drug  treatment.  ASSESSMENT: Patient is currently experiencing drug cravings and urges to use Heroin and is currently using [name].  Patient presents in the precontemplation stage of change and does not think his current substance use is problematic. Based on patient's ASAM rating, he may benefit from level 2 IOP drug treatment.  Patient may also benefit from obtaining a sponsor, attending two 12-step meetings a day, and actively working on his recovery.  GOALS ADDRESSED: Patient will reduce symptoms of: depression and drug cravings and increase knowledge and/or ability of: coping skills, healthy habits and drug triggers and also: Increase healthy adjustment to current life circumstances, Increase adequate support systems for patient/family and Decrease self-medicating behaviors  INTERVENTIONS: Motivational Interviewing and Link to [employer]   PLAN: 1. Follow up with behavioral health clinician on : Monday August 6th at 8: 45 am  2. [facility] will continue to build rapport with the patient and elicit the patient's perception of the problem. 3. Behavioral recommendations: Obtain a sponsor (not his mother) and attend 2 12-step meetings a day 4. Referral(s): Community [facility] Services (LME/Outside Clinic) and Substance Abuse Program walk in to [facility] for IOP treatment for Dual Diagnosis   Vergia Alberts, [facility]

## 2017-05-24 ENCOUNTER — Ambulatory Visit: Payer: BLUE CROSS/BLUE SHIELD | Admitting: Licensed Clinical Social Worker

## 2017-05-24 ENCOUNTER — Telehealth: Payer: Self-pay | Admitting: Licensed Clinical Social Worker

## 2017-05-24 NOTE — Telephone Encounter (Signed)
Spoke to patient's mother about missed appointment.  Rescheduled appointment was made for Thurs 8/9 at 8:45 am.  Vergia Alberts, [facility]

## 2017-05-27 ENCOUNTER — Ambulatory Visit (INDEPENDENT_AMBULATORY_CARE_PROVIDER_SITE_OTHER): Payer: BLUE CROSS/BLUE SHIELD | Admitting: Licensed Clinical Social Worker

## 2017-05-27 DIAGNOSIS — F1510 Other stimulant abuse, uncomplicated: Secondary | ICD-10-CM

## 2017-05-27 DIAGNOSIS — F1121 Opioid dependence, in remission: Secondary | ICD-10-CM

## 2017-05-27 NOTE — Progress Notes (Addendum)
Integrated Behavioral Health Follow Up Visit  MRN: [id number] Name: Pedro Owens   Session Start time: 9:45 am Session End time: 10:30 am Total time: 45 minutes Number of Integrated Behavioral Health Clinician visits: 2/10  Type of Service: Integrated Behavioral Health- Individual/Family Interpretor:No. Interpretor Name and Language: N/A   Warm Hand Off Completed.       SUBJECTIVE: Pedro Owens is a 26 y.o. male accompanied by patient. Patient was referred by Triage Nurse but is a patient of Dr. Orvan Falconer.  Patient presented for 8:45 am appointment at 9:40 am.  Patient reported that he has been continually experiencing drug cravings and has used [place] daily.  Patient reported that yesterday he was triggered to use by witnessing someone purchasing pills, so he asked his mother to go for a walk.  However patient reported that he used last night and went out seeking to purchase.  Patient and [facility] discussed his comfort level with sitting still while experiencing a craving and allowing it to pass.  Patient reported that he attempts this but is unsuccessful.  Patient reported that due to [name] use he has stayed awake for 8 days yesterday and had sleep for the first time today for two hours.  He also reported decreased appetite and could not remember the last time he had a meal.  Patient denied experience of seizures, nausea, or vomiting.    Because of this the [facility] encouraged the patient to report to the [facility] ED for medical evaluation and to rule out dehydration and malnutrition but the patient was not receptive and denied wanting to go.  [facility] asked for permission to bring the Nurse Manager into the session to explain medically why this was necessary and the patient consented.  The Nurse Manager explained the role of protein and fluids on vital organs and also encouraged the patient to visit the ED for medical eval but the patient was resistent.  The patient reported that he would eat at work  today and the Facilities manager encouraged eating beef patties (protein) and drinking OJ and water.  [facility] also encouraged patient to consume Boost or Ensure drinks for nutrition and the patient was receptive to all these suggestions.   [facility] and patient discussed addictive thinking and [facility] solicited change talk from the patient.  The patient stated, " I want to be able to stop feeling the way I do" but was not able to verbalize any physical consequences of continued use.  [facility] again suggested entering treatment for [facility] use and patient was resistant.  [facility] also suggested attending outpatient medical providers who prescribe Naltrexone to manage drug cravings and patient was receptive to this.  [facility] educated patient about the process and the requirement for outpatient counseling and the patient was receptive.  [facility] will locate medical providers within the [facility] area who prescribe Naltrexone.  Patient related his use to a past traumatic event that impacts him every day and rates the intensity a 9/10, but denied remembering a specific event.  Patient reported being uncomfortable with the dark, being alone by himself, and in crowds.  Patient was receptive to counseling by trauma certified clinician to address these issues.  Duration of problem: 1 month; Severity of problem: severe  OBJECTIVE: Behavior: Physical restlessness and agitation Mood: Anxious and Affect: within range Risk of harm to self or others: No plan to harm self or others  Thought process: tangential; "racing" Thought content: logical  ASSESSMENT: Patient is currently experiencing drug cravings and urges  to use Heroin and is currently using [place] daily.  Patient presents in the precontemplation stage of change with [name] use and in the maintenance stage of change for Heroin use.  Patient continues to report that his [name] use is not problematic and is resistant to entering drug treatment. Based on current symptoms, patient may benefit from  level-3 28 day inpatient drug treatment. Patient may also benefit from obtaining a sponsor, attending two 12-step meetings a day, avoiding people places and things, and actively working on his recovery.  GOALS ADDRESSED: Patient will reduce symptoms of: depression and drug cravings and increase knowledge and/or ability of: coping skills, healthy habits and drug triggers and also: Increase healthy adjustment to current life circumstances, Increase adequate support systems for patient/family and Decrease self-medicating behaviors  INTERVENTIONS: Motivational Interviewing and Link to [employer]  PLAN: 1. Follow up with behavioral health clinician on : Monday Aug 13th at 11:15 am 2. Behavioral recommendations: Due to [name] use and suppressed appetite, patient is going to consume hamburgers and fluids to combat malnutrition and dehydration. 3. Referral(s): Medical provider of Naltrexone  Vergia Alberts, LPC

## 2017-05-31 ENCOUNTER — Ambulatory Visit: Payer: BLUE CROSS/BLUE SHIELD | Admitting: Licensed Clinical Social Worker

## 2017-05-31 ENCOUNTER — Telehealth: Payer: Self-pay | Admitting: Licensed Clinical Social Worker

## 2017-05-31 NOTE — Telephone Encounter (Signed)
Spoke to patient's mother on his behalf.  Mother reported that patient missed the appointment today because he was called into work early.  [facility] provided information for [facility] and asked for patient to call and ask for [facility] to arrange for evaluation appointment for MAT- Vivitrol.  Mother stated that she had already called and scheduled appointment for patient on Wed Aug 15th at 1 pm.  [facility] asked for patient to call back after appointment to inform what occurred at the appt with Dr. Midge Aver.  [facility] also asked the mother to assist in ensuring that the patient does not miss the appt to go to work and she was receptive.  Vergia Alberts, [facility]

## 2017-06-10 ENCOUNTER — Other Ambulatory Visit: Payer: BLUE CROSS/BLUE SHIELD

## 2017-06-17 ENCOUNTER — Telehealth: Payer: Self-pay | Admitting: Licensed Clinical Social Worker

## 2017-06-17 NOTE — Telephone Encounter (Signed)
Spoke to patient and asked if he has been attending treatment at [facility] and patient denied.  Patient denied that information was provided to him about going to this clinic for naltrexone treatment and behavioral health from his mother.  [facility] provided the contact information and asked the patient to call [facility] to arrange for evaluation appointment for MAT- Vivitrol.  [facility] asked for patient to call back after receiving appointment to confirm.  Patient was receptive.  Vergia Alberts, [facility]

## 2017-06-24 ENCOUNTER — Telehealth: Payer: Self-pay

## 2017-06-24 ENCOUNTER — Ambulatory Visit: Payer: BLUE CROSS/BLUE SHIELD | Admitting: Internal Medicine

## 2017-06-24 NOTE — Telephone Encounter (Signed)
Called patient to inform him that he missed his appointment with [place] and to call us back to reschedule.   Lorenso Courier, [place]

## 2017-07-22 ENCOUNTER — Ambulatory Visit: Payer: BLUE CROSS/BLUE SHIELD | Admitting: Internal Medicine

## 2017-08-03 ENCOUNTER — Telehealth: Payer: Self-pay | Admitting: Licensed Clinical Social Worker

## 2017-08-03 NOTE — Telephone Encounter (Signed)
[facility] called patient at both the home and cell number and left a message on the cell phone number asking for a return call to arrange for an appointment for provider, labs, and behavioral health.  Vergia Alberts, [facility]

## 2017-08-04 IMAGING — CR DG CHEST 2V
2 series · 2 of 2 positions shown · non-contrast
Comparison: 07/18/2012

CLINICAL DATA: Seizure today, coughing up blood for 1 week. History
of smoking.

EXAM:
CHEST  2 VIEW

[w chest pa]
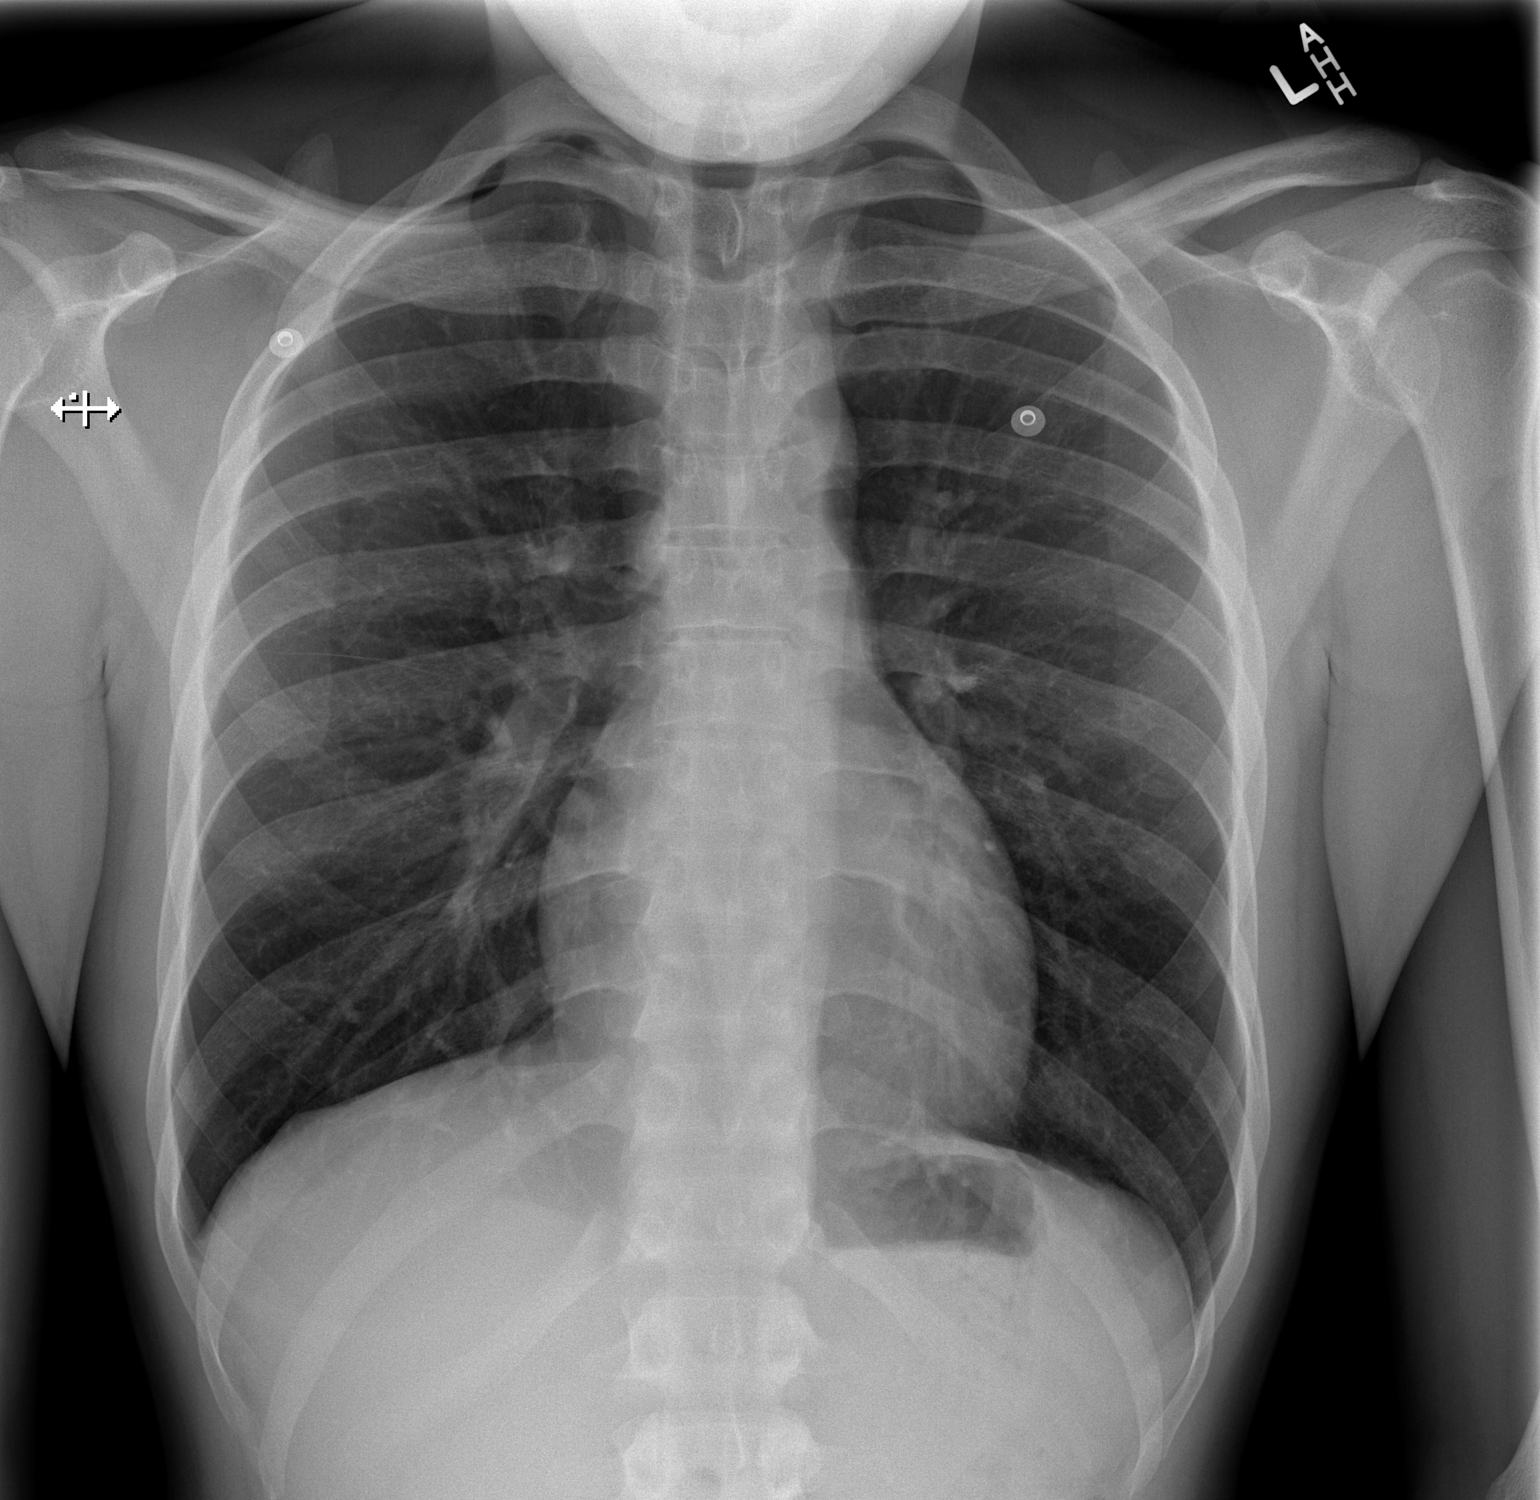

[w chest lat]
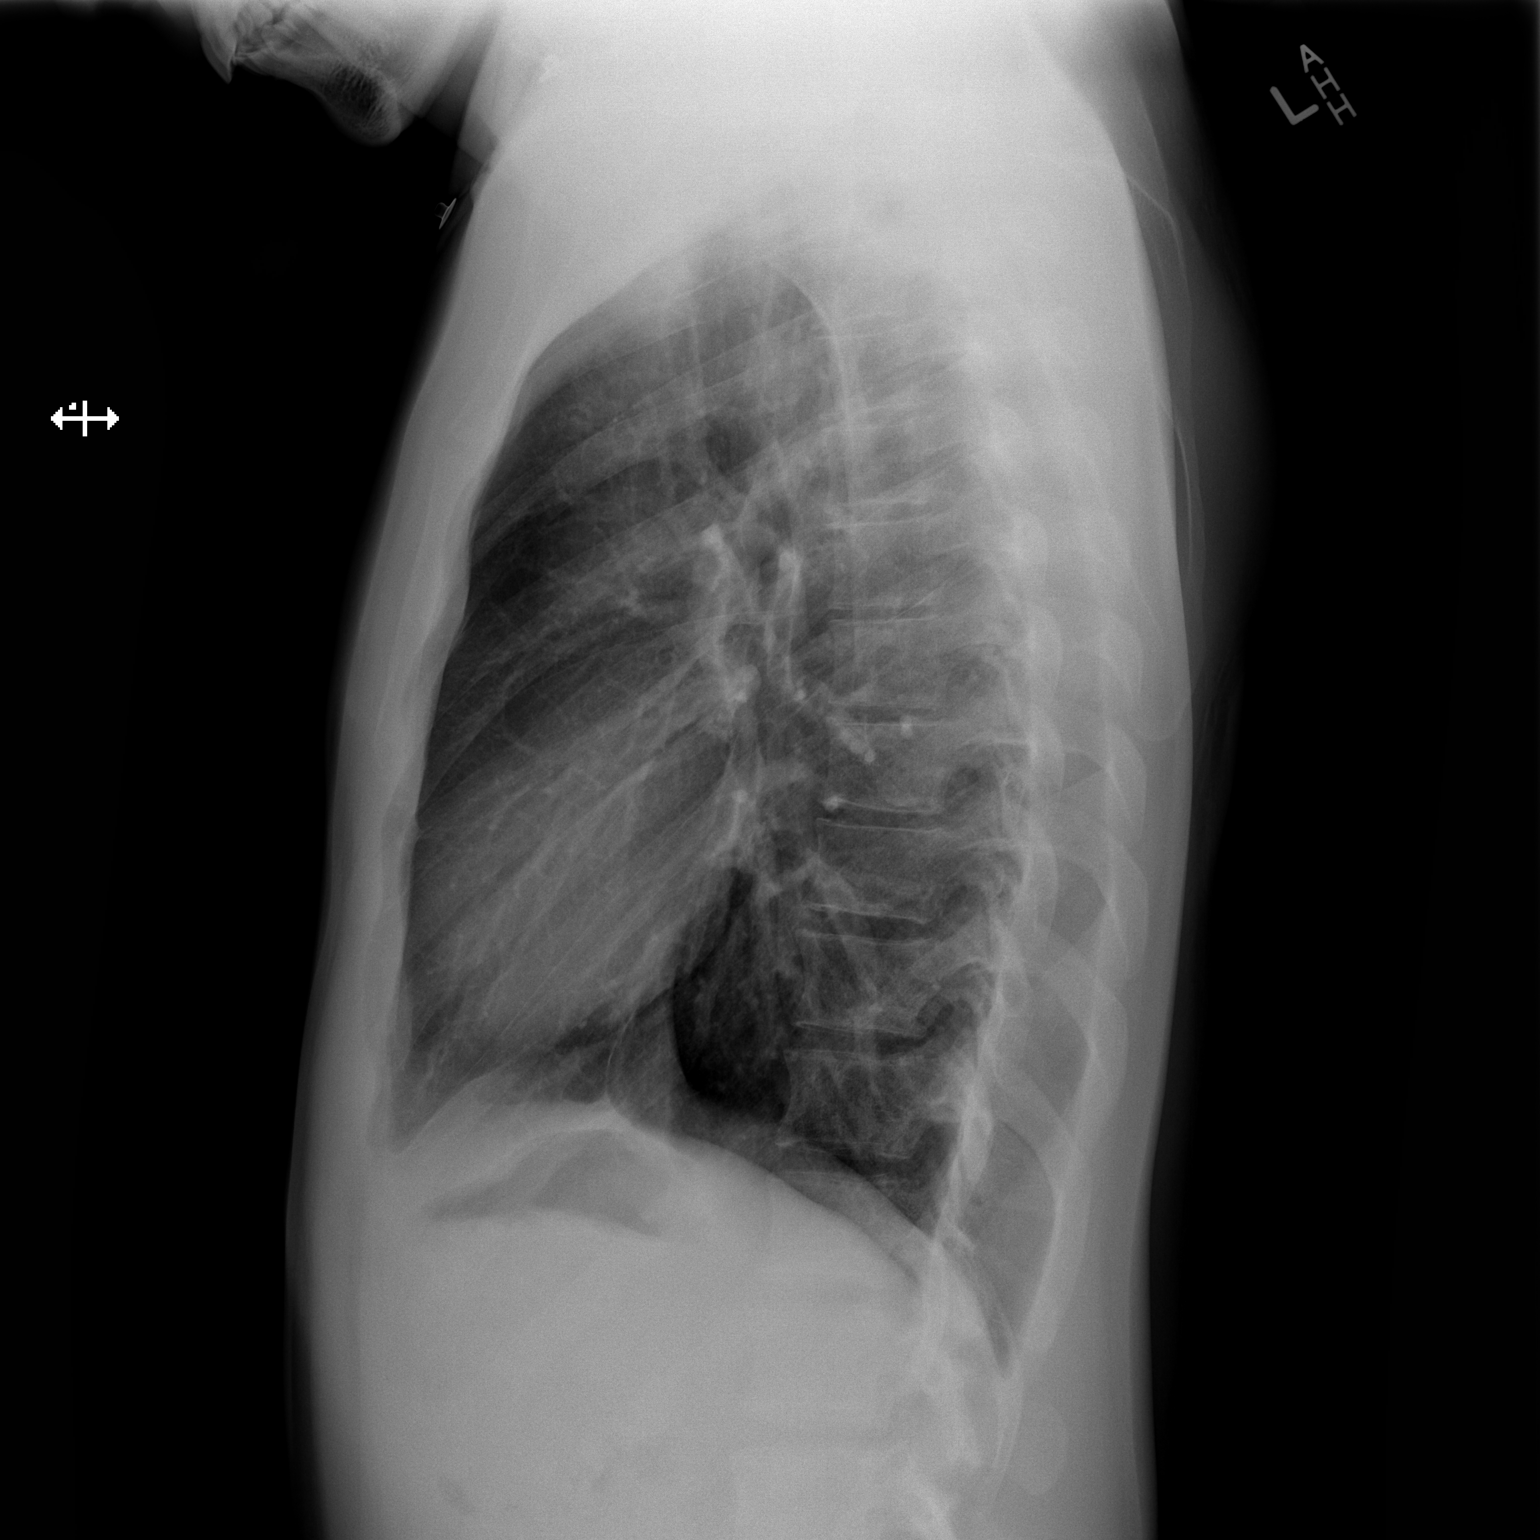

[2 of 2 positions shown; findings below may reference images not displayed]

FINDINGS: Cardiomediastinal silhouette is normal. Mediastinal contours appear
intact.

There is no evidence of focal airspace consolidation, pleural
effusion or pneumothorax.

Osseous structures are without acute abnormality. Soft tissues are
grossly normal.
IMPRESSION: No radiographic evidence of acute cardiopulmonary abnormality.

## 2017-08-16 ENCOUNTER — Ambulatory Visit: Payer: BLUE CROSS/BLUE SHIELD | Admitting: Internal Medicine

## 2017-08-16 ENCOUNTER — Telehealth: Payer: Self-pay | Admitting: Internal Medicine

## 2017-08-16 ENCOUNTER — Telehealth: Payer: Self-pay

## 2017-08-16 NOTE — Telephone Encounter (Signed)
Per verbal order I called Lb mother regarding his missed appt with Dr. Orvan Falconer 08/16/17 and in hopes that one of the two will call back to reschedule his missed appt.  Pedro Owens, [place]

## 2017-08-16 NOTE — Telephone Encounter (Signed)
His mother informed us that he was diagnosed with hepatitis C, herpes and syphilis while he was in the drug rehabilitation center in June.

## 2017-11-15 ENCOUNTER — Other Ambulatory Visit: Payer: Self-pay

## 2017-11-15 ENCOUNTER — Emergency Department (HOSPITAL_COMMUNITY): Payer: BLUE CROSS/BLUE SHIELD

## 2017-11-15 ENCOUNTER — Inpatient Hospital Stay (HOSPITAL_COMMUNITY)
Admission: EM | Admit: 2017-11-15 | Discharge: 2017-11-22 | DRG: 974 | Disposition: A | Discharge status: Home / Self Care | Payer: BLUE CROSS/BLUE SHIELD | Attending: Oncology | Admitting: Oncology

## 2017-11-15 ENCOUNTER — Encounter (HOSPITAL_COMMUNITY): Payer: Self-pay | Admitting: Nurse Practitioner

## 2017-11-15 DIAGNOSIS — F1590 Other stimulant use, unspecified, uncomplicated: Secondary | ICD-10-CM | POA: Diagnosis present

## 2017-11-15 DIAGNOSIS — J42 Unspecified chronic bronchitis: Secondary | ICD-10-CM | POA: Diagnosis present

## 2017-11-15 DIAGNOSIS — Z9114 Patient's other noncompliance with medication regimen: Secondary | ICD-10-CM | POA: Diagnosis not present

## 2017-11-15 DIAGNOSIS — Z8249 Family history of ischemic heart disease and other diseases of the circulatory system: Secondary | ICD-10-CM

## 2017-11-15 DIAGNOSIS — B20 Human immunodeficiency virus [HIV] disease: Secondary | ICD-10-CM | POA: Diagnosis present

## 2017-11-15 DIAGNOSIS — I10 Essential (primary) hypertension: Secondary | ICD-10-CM | POA: Diagnosis present

## 2017-11-15 DIAGNOSIS — B009 Herpesviral infection, unspecified: Secondary | ICD-10-CM | POA: Diagnosis present

## 2017-11-15 DIAGNOSIS — F1490 Cocaine use, unspecified, uncomplicated: Secondary | ICD-10-CM | POA: Diagnosis present

## 2017-11-15 DIAGNOSIS — R768 Other specified abnormal immunological findings in serum: Secondary | ICD-10-CM

## 2017-11-15 DIAGNOSIS — I38 Endocarditis, valve unspecified: Secondary | ICD-10-CM | POA: Diagnosis present

## 2017-11-15 DIAGNOSIS — R7881 Bacteremia: Secondary | ICD-10-CM | POA: Diagnosis not present

## 2017-11-15 DIAGNOSIS — R079 Chest pain, unspecified: Secondary | ICD-10-CM | POA: Diagnosis not present

## 2017-11-15 DIAGNOSIS — F909 Attention-deficit hyperactivity disorder, unspecified type: Secondary | ICD-10-CM | POA: Diagnosis present

## 2017-11-15 DIAGNOSIS — N179 Acute kidney failure, unspecified: Secondary | ICD-10-CM | POA: Diagnosis not present

## 2017-11-15 DIAGNOSIS — J45909 Unspecified asthma, uncomplicated: Secondary | ICD-10-CM | POA: Diagnosis present

## 2017-11-15 DIAGNOSIS — F1990 Other psychoactive substance use, unspecified, uncomplicated: Secondary | ICD-10-CM

## 2017-11-15 DIAGNOSIS — A419 Sepsis, unspecified organism: Secondary | ICD-10-CM | POA: Diagnosis present

## 2017-11-15 DIAGNOSIS — F649 Gender identity disorder, unspecified: Secondary | ICD-10-CM | POA: Diagnosis present

## 2017-11-15 DIAGNOSIS — I76 Septic arterial embolism: Secondary | ICD-10-CM | POA: Diagnosis not present

## 2017-11-15 DIAGNOSIS — Z888 Allergy status to other drugs, medicaments and biological substances status: Secondary | ICD-10-CM

## 2017-11-15 DIAGNOSIS — R0989 Other specified symptoms and signs involving the circulatory and respiratory systems: Secondary | ICD-10-CM

## 2017-11-15 DIAGNOSIS — B1920 Unspecified viral hepatitis C without hepatic coma: Secondary | ICD-10-CM | POA: Diagnosis present

## 2017-11-15 DIAGNOSIS — I330 Acute and subacute infective endocarditis: Secondary | ICD-10-CM | POA: Diagnosis not present

## 2017-11-15 DIAGNOSIS — F1290 Cannabis use, unspecified, uncomplicated: Secondary | ICD-10-CM | POA: Diagnosis not present

## 2017-11-15 DIAGNOSIS — R0781 Pleurodynia: Secondary | ICD-10-CM | POA: Diagnosis not present

## 2017-11-15 DIAGNOSIS — R0682 Tachypnea, not elsewhere classified: Secondary | ICD-10-CM | POA: Diagnosis not present

## 2017-11-15 DIAGNOSIS — F17210 Nicotine dependence, cigarettes, uncomplicated: Secondary | ICD-10-CM | POA: Diagnosis present

## 2017-11-15 DIAGNOSIS — F1910 Other psychoactive substance abuse, uncomplicated: Secondary | ICD-10-CM | POA: Diagnosis not present

## 2017-11-15 DIAGNOSIS — B182 Chronic viral hepatitis C: Secondary | ICD-10-CM

## 2017-11-15 DIAGNOSIS — I339 Acute and subacute endocarditis, unspecified: Secondary | ICD-10-CM

## 2017-11-15 DIAGNOSIS — I2690 Septic pulmonary embolism without acute cor pulmonale: Secondary | ICD-10-CM | POA: Diagnosis present

## 2017-11-15 DIAGNOSIS — Z21 Asymptomatic human immunodeficiency virus [HIV] infection status: Secondary | ICD-10-CM | POA: Diagnosis not present

## 2017-11-15 DIAGNOSIS — R000 Tachycardia, unspecified: Secondary | ICD-10-CM | POA: Diagnosis present

## 2017-11-15 DIAGNOSIS — Z885 Allergy status to narcotic agent status: Secondary | ICD-10-CM | POA: Diagnosis not present

## 2017-11-15 DIAGNOSIS — F419 Anxiety disorder, unspecified: Secondary | ICD-10-CM | POA: Diagnosis present

## 2017-11-15 DIAGNOSIS — D72829 Elevated white blood cell count, unspecified: Secondary | ICD-10-CM | POA: Diagnosis not present

## 2017-11-15 DIAGNOSIS — R51 Headache: Secondary | ICD-10-CM | POA: Diagnosis not present

## 2017-11-15 DIAGNOSIS — R1012 Left upper quadrant pain: Secondary | ICD-10-CM | POA: Diagnosis not present

## 2017-11-15 DIAGNOSIS — Z91041 Radiographic dye allergy status: Secondary | ICD-10-CM | POA: Diagnosis not present

## 2017-11-15 DIAGNOSIS — Z886 Allergy status to analgesic agent status: Secondary | ICD-10-CM | POA: Diagnosis not present

## 2017-11-15 DIAGNOSIS — G894 Chronic pain syndrome: Secondary | ICD-10-CM | POA: Diagnosis present

## 2017-11-15 DIAGNOSIS — F1190 Opioid use, unspecified, uncomplicated: Secondary | ICD-10-CM | POA: Diagnosis present

## 2017-11-15 DIAGNOSIS — F319 Bipolar disorder, unspecified: Secondary | ICD-10-CM | POA: Diagnosis present

## 2017-11-15 DIAGNOSIS — R6510 Systemic inflammatory response syndrome (SIRS) of non-infectious origin without acute organ dysfunction: Secondary | ICD-10-CM | POA: Diagnosis present

## 2017-11-15 DIAGNOSIS — I2699 Other pulmonary embolism without acute cor pulmonale: Secondary | ICD-10-CM | POA: Diagnosis not present

## 2017-11-15 DIAGNOSIS — Z23 Encounter for immunization: Secondary | ICD-10-CM | POA: Diagnosis not present

## 2017-11-15 DIAGNOSIS — Z79899 Other long term (current) drug therapy: Secondary | ICD-10-CM

## 2017-11-15 HISTORY — DX: Unspecified viral hepatitis C without hepatic coma: B19.20

## 2017-11-15 HISTORY — DX: Herpesviral infection, unspecified: B00.9

## 2017-11-15 HISTORY — DX: Syphilis, unspecified: A53.9

## 2017-11-15 HISTORY — DX: Unspecified asthma, uncomplicated: J45.909

## 2017-11-15 LAB — RAPID URINE DRUG SCREEN, HOSP PERFORMED
Amphetamines: POSITIVE — AB
BENZODIAZEPINES: NOT DETECTED
Barbiturates: NOT DETECTED
Cocaine: POSITIVE — AB
Opiates: POSITIVE — AB
Tetrahydrocannabinol: POSITIVE — AB

## 2017-11-15 LAB — INFLUENZA PANEL BY PCR (TYPE A & B)
INFLBPCR: NEGATIVE
Influenza A By PCR: NEGATIVE

## 2017-11-15 LAB — COMPREHENSIVE METABOLIC PANEL
ALBUMIN: 3.5 g/dL (ref 3.5–5.0)
ALT: 42 U/L (ref 17–63)
AST: 41 U/L (ref 15–41)
Alkaline Phosphatase: 68 U/L (ref 38–126)
Anion gap: 10 (ref 5–15)
BUN: 20 mg/dL (ref 6–20)
CO2: 23 mmol/L (ref 22–32)
CREATININE: 1.24 mg/dL (ref 0.61–1.24)
Calcium: 8.8 mg/dL — ABNORMAL LOW (ref 8.9–10.3)
Chloride: 103 mmol/L (ref 101–111)
GFR calc Af Amer: >60 mL/min (ref >60)
GLUCOSE: 116 mg/dL — AB (ref 65–99)
Potassium: 3.5 mmol/L (ref 3.5–5.1)
Sodium: 136 mmol/L (ref 135–145)
TOTAL PROTEIN: 7.9 g/dL (ref 6.5–8.1)
Total Bilirubin: 0.7 mg/dL (ref 0.3–1.2)

## 2017-11-15 LAB — CBC WITH DIFFERENTIAL/PLATELET
BASOS ABS: 0 10*3/uL (ref 0.0–0.1)
Basophils Relative: 0 %
EOS ABS: 0.1 10*3/uL (ref 0.0–0.7)
EOS PCT: 0 %
HCT: 43.3 % (ref 39.0–52.0)
Hemoglobin: 14.8 g/dL (ref 13.0–17.0)
LYMPHS ABS: 1.5 10*3/uL (ref 0.7–4.0)
Lymphocytes Relative: 7 %
MCH: 28.8 pg (ref 26.0–34.0)
MCHC: 34.2 g/dL (ref 30.0–36.0)
MCV: 84.4 fL (ref 78.0–100.0)
Monocytes Absolute: 1 10*3/uL (ref 0.1–1.0)
Monocytes Relative: 5 %
Neutro Abs: 18.6 10*3/uL — ABNORMAL HIGH (ref 1.7–7.7)
Neutrophils Relative %: 88 %
PLATELETS: 317 10*3/uL (ref 150–400)
RBC: 5.13 MIL/uL (ref 4.22–5.81)
RDW: 13.5 % (ref 11.5–15.5)
WBC: 21.2 10*3/uL — AB (ref 4.0–10.5)

## 2017-11-15 LAB — URINALYSIS, ROUTINE W REFLEX MICROSCOPIC
Bilirubin Urine: NEGATIVE
GLUCOSE, UA: NEGATIVE mg/dL
HGB URINE DIPSTICK: NEGATIVE
KETONES UR: NEGATIVE mg/dL
Leukocytes, UA: NEGATIVE
Nitrite: NEGATIVE
PROTEIN: NEGATIVE mg/dL
Specific Gravity, Urine: 1.03 (ref 1.005–1.030)
pH: 6 (ref 5.0–8.0)

## 2017-11-15 LAB — I-STAT CG4 LACTIC ACID, ED
LACTIC ACID, VENOUS: 0.99 mmol/L (ref 0.5–1.9)
Lactic Acid, Venous: 2.36 mmol/L (ref 0.5–1.9)
Lactic Acid, Venous: 3.13 mmol/L (ref 0.5–1.9)

## 2017-11-15 LAB — I-STAT TROPONIN, ED: TROPONIN I, POC: 0 ng/mL (ref 0.00–0.08)

## 2017-11-15 LAB — D-DIMER, QUANTITATIVE (NOT AT ARMC)

## 2017-11-15 LAB — LACTATE DEHYDROGENASE: LDH: 151 U/L (ref 98–192)

## 2017-11-15 MED ORDER — PIPERACILLIN-TAZOBACTAM 3.375 G IVPB 30 MIN
3.3750 g | Freq: Once | INTRAVENOUS | Status: AC
  Administered 2017-11-15: 3.375 g via INTRAVENOUS
  Filled 2017-11-15: qty 50

## 2017-11-15 MED ORDER — VANCOMYCIN HCL 10 G IV SOLR
1500.0000 mg | Freq: Once | INTRAVENOUS | Status: AC
  Administered 2017-11-15: 1500 mg via INTRAVENOUS
  Filled 2017-11-15: qty 1500

## 2017-11-15 MED ORDER — ACETAMINOPHEN 500 MG PO TABS
1000.0000 mg | ORAL_TABLET | Freq: Four times a day (QID) | ORAL | Status: DC | PRN
  Administered 2017-11-15 – 2017-11-22 (×6): 1000 mg via ORAL
  Filled 2017-11-15 (×6): qty 2

## 2017-11-15 MED ORDER — NICOTINE 21 MG/24HR TD PT24
21.0000 mg | MEDICATED_PATCH | Freq: Every day | TRANSDERMAL | Status: DC
  Administered 2017-11-15 – 2017-11-22 (×6): 21 mg via TRANSDERMAL
  Filled 2017-11-15 (×7): qty 1

## 2017-11-15 MED ORDER — MORPHINE SULFATE (PF) 4 MG/ML IV SOLN
4.0000 mg | Freq: Once | INTRAVENOUS | Status: AC
  Administered 2017-11-15: 4 mg via INTRAVENOUS
  Filled 2017-11-15: qty 1

## 2017-11-15 MED ORDER — INFLUENZA VAC SPLIT QUAD 0.5 ML IM SUSY
0.5000 mL | PREFILLED_SYRINGE | INTRAMUSCULAR | Status: AC
  Administered 2017-11-16: 0.5 mL via INTRAMUSCULAR
  Filled 2017-11-15: qty 0.5

## 2017-11-15 MED ORDER — DEXTROSE 5 % IV SOLN
2.0000 g | Freq: Two times a day (BID) | INTRAVENOUS | Status: DC
  Administered 2017-11-15 – 2017-11-17 (×4): 2 g via INTRAVENOUS
  Filled 2017-11-15 (×4): qty 2

## 2017-11-15 MED ORDER — SODIUM CHLORIDE 0.9 % IV BOLUS (SEPSIS)
1000.0000 mL | Freq: Once | INTRAVENOUS | Status: AC
  Administered 2017-11-15: 1000 mL via INTRAVENOUS

## 2017-11-15 MED ORDER — PIPERACILLIN-TAZOBACTAM 3.375 G IVPB: 3.3750 g | Freq: Three times a day (TID) | INTRAVENOUS | Status: DC

## 2017-11-15 MED ORDER — VANCOMYCIN HCL IN DEXTROSE 750-5 MG/150ML-% IV SOLN
750.0000 mg | Freq: Three times a day (TID) | INTRAVENOUS | Status: DC
  Administered 2017-11-15 – 2017-11-17 (×6): 750 mg via INTRAVENOUS
  Filled 2017-11-15 (×6): qty 150

## 2017-11-15 MED ORDER — ONDANSETRON HCL 4 MG/2ML IJ SOLN
4.0000 mg | Freq: Once | INTRAMUSCULAR | Status: AC
  Administered 2017-11-15: 4 mg via INTRAVENOUS
  Filled 2017-11-15: qty 2

## 2017-11-15 MED ORDER — SODIUM CHLORIDE 0.9 % IV SOLN
INTRAVENOUS | Status: DC
  Administered 2017-11-15: 21:00:00 via INTRAVENOUS

## 2017-11-15 MED ORDER — ENOXAPARIN SODIUM 40 MG/0.4ML ~~LOC~~ SOLN
40.0000 mg | SUBCUTANEOUS | Status: DC
  Administered 2017-11-15 – 2017-11-21 (×7): 40 mg via SUBCUTANEOUS
  Filled 2017-11-15 (×7): qty 0.4

## 2017-11-15 MED ORDER — PNEUMOCOCCAL VAC POLYVALENT 25 MCG/0.5ML IJ INJ
0.5000 mL | INJECTION | INTRAMUSCULAR | Status: AC
  Administered 2017-11-16: 0.5 mL via INTRAMUSCULAR
  Filled 2017-11-15: qty 0.5

## 2017-11-15 MED ORDER — KETOROLAC TROMETHAMINE 30 MG/ML IJ SOLN
30.0000 mg | Freq: Once | INTRAMUSCULAR | Status: AC
  Administered 2017-11-15: 30 mg via INTRAVENOUS
  Filled 2017-11-15: qty 1

## 2017-11-15 MED ORDER — ACETAMINOPHEN 500 MG PO TABS
1000.0000 mg | ORAL_TABLET | Freq: Once | ORAL | Status: AC
  Administered 2017-11-15: 1000 mg via ORAL
  Filled 2017-11-15: qty 2

## 2017-11-15 MED ORDER — IOPAMIDOL (ISOVUE-370) INJECTION 76%
INTRAVENOUS | Status: AC
  Administered 2017-11-15: 100 mL
  Filled 2017-11-15: qty 100

## 2017-11-15 MED ORDER — SODIUM CHLORIDE 0.9 % IV BOLUS (SEPSIS)
500.0000 mL | Freq: Once | INTRAVENOUS | Status: AC
  Administered 2017-11-15: 500 mL via INTRAVENOUS

## 2017-11-15 NOTE — Progress Notes (Signed)
Pharmacy Antibiotic Note  Pedro Owens is a 27 y.o. male admitted on 11/15/2017 with endocarditis.  Pharmacy has been consulted for ceftriaxone dosing from Zosyn to mediate renal injury. Will continue on vancomycin for now per MD. Pt is afebrile. Elevated WBC at 21.2. Elevated Scr at 1.24 (baseline ~0.9). LA normalized at this time.  Plan: Ceftriaxone 2gm IV q12h Continue vancomycin 750mg  IV q8h F/u ID recs, renal fxn, trough @ SS, and clinical resolution  Height: 5\' 11"  (180.3 cm) Weight: 168 lb (76.2 kg) IBW/kg (Calculated) : 75.3  Temp (24hrs), Avg:100.6 F (38.1 C), Min:100.2 F (37.9 C), Max:100.9 F (38.3 C)  Recent Labs  Lab 11/15/17 0936 11/15/17 1014 11/15/17 1217 11/15/17 1414  WBC 21.2*  --   --   --   CREATININE 1.24  --   --   --   LATICACIDVEN  --  2.36* 3.13* 0.99    Estimated Creatinine Clearance: 97 mL/min (by C-G formula based on SCr of 1.24 mg/dL).    Allergies  Allergen Reactions  . Acetaminophen Diarrhea    Flares up crohns disease  . Other     IV CONTRAST  . Vicodin [Hydrocodone-Acetaminophen] Nausea And Vomiting  . Dilaudid [Hydromorphone Hcl] Hives and Rash    Antimicrobials this admission: Vanc 1/28 >> CTX 1/28 >> Zosyn 1/28 >> 1/28  Dose adjustments this admission: None  Microbiology results: Pending  Thank you for allowing pharmacy to be a part of this patient's care.  Katherine Mantle, PharmD Candidate 11/15/2017 3:20 PM   I discussed / reviewed the pharmacy note by Ms Shayne Alken, PharmD candidate and I agree with the student's findings and plans as documented.  Link Snuffer, PharmD, BCPS, BCCCP Clinical Pharmacist Clinical phone 11/15/2017 until 11PM [phone] After hours, please call [phone] 11/15/2017, 5:32 PM

## 2017-11-15 NOTE — Progress Notes (Signed)
Notified on-call provider that Pt continues to C/O back and flank pain 10/10 with no relief from tylenol. Orders to give one time dose IV toradol.

## 2017-11-15 NOTE — ED Notes (Signed)
Family member requesting pt receive more pain medicine. Nothing ordered at this time, will ask MD

## 2017-11-15 NOTE — ED Provider Notes (Signed)
[facility] EMERGENCY DEPARTMENT Provider Note   CSN: [id number] Arrival date & time: 11/15/17  4098     History   Chief Complaint Chief Complaint  Patient presents with  . Chest Pain    HPI Pedro Owens is a 27 y.o. male.  HPI 27 year old African-American male with a past medical history significant for hepatitis C, herpes, HIV, hypertension, IV drug use, chronic pain that presents to the emergency department today for evaluation of acute onset left-sided pleuritic chest pain.  Patient states the pain started yesterday and has progressively worsened.  Difficult to take a deep breath due to the pain.  Nothing makes better or worse.  Pain does not radiate.  Patient denies any associated cough, congestion, rhinorrhea, sore throat, fevers, chills.  Patient denies any history of DVT/PE, prolonged immobilization, recent hospitalization/surgeries, unilateral leg swelling or calf tenderness, hormone use, tobacco use.  Patient states that sitting forward will make the pain better.  Has not tried any over-the-counter medications for his pain.  Patient does report history of HIV last CD4 count was 800 in September of last year.  Patient states that he is failed to follow-up with infectious disease nor take his antiretroviral medications.  Patient also states he has a history of IV drug use last use was 2-3 months ago.  Patient reports any other infectious symptoms including abdominal pain, urinary symptoms, or change in bowel habits.   Pt denies any fever, chill, ha, vision changes, lightheadedness, dizziness, congestion, neck pain, abd pain, n/v/d, urinary symptoms, change in bowel habits, melena, hematochezia, lower extremity paresthesias.  Past Medical History:  Diagnosis Date  . ADHD (attention deficit hyperactivity disorder)   . Anal warts   . Anxiety   . Asthma   . Bipolar disorder (HCC)   . Chronic bronchitis (HCC)   . Chronic pain syndrome   . Convulsions (HCC)  08/14/2015  . Depression   . Gonorrhea 09/08/11  . Headache    "weekly" (11/06/2016)  . Hemorrhoids, internal, with bleeding 08/25/2011  . Hepatitis C   . Herpes   . HIV infection (HCC)   . Hypertension   . ILEITIS 09/24/2010   Qualifier: Diagnosis of  By: Nelson-Smith CMA (AAMA), Dottie    . Lymphoid hyperplasia, reactive   . Marijuana abuse   . Migraine    "monthly" (11/06/2016)  . Pseudoseizures   . Seizures (HCC)    "don't know why I have them" (11/06/2016)  . Syphilis     Patient Active Problem List   Diagnosis Date Noted  . Abscess of right forearm 11/04/2016  . HIV disease (HCC) 04/07/2016  . Cigarette smoker 04/07/2016  . Polysubstance abuse (HCC) 04/07/2016  . Depression 08/14/2015  . Conversion disorder 08/14/2015  . Hemorrhoids, internal, with bleeding 08/25/2011  . Anal warts 08/25/2011  . ANXIETY 09/24/2010  . ADHD 09/24/2010    Past Surgical History:  Procedure Laterality Date  . ANKLE SURGERY Bilateral 2005   Screw & Pins  . ANKLE SURGERY Right 2008   "replaced pins & screws"  . FOOT SURGERY Bilateral 2005   "bone spurs"  . I&D EXTREMITY Right 06/26/2016   Procedure: IRRIGATION AND DEBRIDEMENT RIGHT FOREARM;  Surgeon: Betha Loa, MD;  Location: [facility] OR;  Service: Orthopedics;  Laterality: Right;  . I&D EXTREMITY Right 11/04/2016   Procedure: IRRIGATION AND DEBRIDEMENT EXTREMITY;  Surgeon: Dairl Ponder, MD;  Location: [facility] OR;  Service: Orthopedics;  Laterality: Right;  . I&D EXTREMITY Right 11/06/2016   Procedure: IRRIGATION  AND DEBRIDEMENT right forarm;  Surgeon: Dairl Ponder, MD;  Location: [facility] OR;  Service: Orthopedics;  Laterality: Right;       Home Medications    Prior to Admission medications   Medication Sig Start Date End Date Taking? Authorizing Provider  abacavir-dolutegravir-lamiVUDine (TRIUMEQ) 600-50-300 MG tablet Take 1 tablet by mouth daily. 12/30/16   Cliffton Asters, MD  amoxicillin (AMOXIL) 875 MG tablet Take 875 mg by mouth 2  (two) times daily. ABT Start Date 12/05/16 & End Date 12/12/16.    [provider]  methocarbamol (ROBAXIN) 500 MG tablet Take 1 tablet (500 mg total) by mouth every 8 (eight) hours as needed for muscle spasms. 11/09/16   Rolly Salter, MD  naproxen (NAPROSYN) 500 MG tablet Take 1 tablet (500 mg total) by mouth 3 (three) times daily with meals. 11/09/16   Rolly Salter, MD  ondansetron (ZOFRAN) 4 MG tablet Take 1 tablet (4 mg total) by mouth every 4 (four) hours as needed for nausea. Patient not taking: Reported on 11/04/2016 07/03/16   Clydia Llano, MD  divalproex (DEPAKOTE) 500 MG DR tablet Take 1 tablet (500 mg total) by mouth 2 (two) times daily. 02/28/16 02/28/16  Lindalou Hose, MD    Family History Family History  Problem Relation Age of Onset  . Diabetes Mother   . Irritable bowel syndrome Mother   . Hypertension Mother   . Hyperlipidemia Mother   . Hypothyroidism Mother   . Diabetes Maternal Uncle   . Heart disease Maternal Grandmother   . Leukemia Other        maternal greatgrandmother  . Colon cancer Neg Hx     Social History Social History   Tobacco Use  . Smoking status: Current Every Day Smoker    Packs/day: 1.50    Years: 11.00    Pack years: 16.50    Types: Cigarettes  . Smokeless tobacco: Never Used  Substance Use Topics  . Alcohol use: Yes    Comment: 11/06/2016 "nothing in a very long time"  . Drug use: Yes    Frequency: 2.0 times per week    Types: Cocaine, Benzodiazepines, Oxycodone, Marijuana, Heroin    Comment: yesterday     Allergies   Acetaminophen; Other; Vicodin [hydrocodone-acetaminophen]; and Dilaudid [hydromorphone hcl]   Review of Systems Review of Systems  Constitutional: Negative for chills, diaphoresis and fever.  HENT: Negative for congestion.   Eyes: Negative for visual disturbance.  Respiratory: Positive for shortness of breath. Negative for cough.   Cardiovascular: Positive for chest pain. Negative for palpitations and  leg swelling.  Gastrointestinal: Negative for abdominal pain, diarrhea, nausea and vomiting.  Genitourinary: Negative for dysuria, flank pain, frequency, hematuria and urgency.  Musculoskeletal: Negative for arthralgias and myalgias.  Skin: Negative for rash.  Neurological: Negative for dizziness, syncope, weakness, light-headedness, numbness and headaches.  Psychiatric/Behavioral: Negative for sleep disturbance. The patient is not nervous/anxious.      Physical Exam Updated Vital Signs BP 131/80   Pulse 100   Temp (!) 100.9 F (38.3 C)   Resp (!) 22   Ht 5\' 11"  (1.803 m)   Wt 76.2 kg (168 lb)   SpO2 100%   BMI 23.43 kg/m   Physical Exam  Constitutional: He is oriented to person, place, and time. He appears well-developed and well-nourished.  Non-toxic appearance. No distress.  HENT:  Head: Normocephalic and atraumatic.  Nose: Nose normal.  Mouth/Throat: Oropharynx is clear and moist.  Eyes: Conjunctivae are normal. Pupils are equal, round,  and reactive to light. Right eye exhibits no discharge. Left eye exhibits no discharge.  Neck: Normal range of motion. Neck supple. No JVD present. No tracheal deviation present.  Cardiovascular: Normal rate, regular rhythm, normal heart sounds and intact distal pulses. Exam reveals no gallop and no friction rub.  No murmur heard. Tachycardia noted  Pulmonary/Chest: Effort normal and breath sounds normal. No stridor. No respiratory distress. He has no wheezes. He exhibits tenderness.  Tachypnea with pleuritic chest pain.  Tender to palpation over the left anterior chest wall.  No hypoxia noted.  Rhonchorous sounds throughout all lung fields.  No wheezing noted.  Abdominal: Soft. Bowel sounds are normal. He exhibits no distension. There is no tenderness. There is no rebound and no guarding.  Musculoskeletal: Normal range of motion. He exhibits no tenderness.  No lower extremity edema or calf tenderness.  Lymphadenopathy:    He has no  cervical adenopathy.  Neurological: He is alert and oriented to person, place, and time.  Skin: Skin is warm and dry. Capillary refill takes less than 2 seconds. No rash noted. He is not diaphoretic.  Psychiatric: His behavior is normal. Judgment and thought content normal.  Nursing note and vitals reviewed.    ED Treatments / Results  Labs (all labs ordered are listed, but only abnormal results are displayed) Labs Reviewed  COMPREHENSIVE METABOLIC PANEL - Abnormal; Notable for the following components:      Result Value   Glucose, Bld 116 (*)    Calcium 8.8 (*)    All other components within normal limits  CBC WITH DIFFERENTIAL/PLATELET - Abnormal; Notable for the following components:   WBC 21.2 (*)    Neutro Abs 18.6 (*)    All other components within normal limits  I-STAT CG4 LACTIC ACID, ED - Abnormal; Notable for the following components:   Lactic Acid, Venous 2.36 (*)    All other components within normal limits  I-STAT CG4 LACTIC ACID, ED - Abnormal; Notable for the following components:   Lactic Acid, Venous 3.13 (*)    All other components within normal limits  CULTURE, BLOOD (ROUTINE X 2)  CULTURE, BLOOD (ROUTINE X 2)  INFLUENZA PANEL BY PCR (TYPE A & B)  LACTATE DEHYDROGENASE  D-DIMER, QUANTITATIVE (NOT AT [facility])  URINALYSIS, ROUTINE W REFLEX MICROSCOPIC  RAPID URINE DRUG SCREEN, HOSP PERFORMED  I-STAT TROPONIN, ED  I-STAT CG4 LACTIC ACID, ED  I-STAT CG4 LACTIC ACID, ED    EKG  EKG Interpretation  Date/Time:  Monday November 15 2017 12:55:46 EST Ventricular Rate:  88 PR Interval:  138 QRS Duration: 91 QT Interval:  365 QTC Calculation: 442 R Axis:   90 Text Interpretation:  Sinus rhythm Borderline right axis deviation Confirmed by Tilden Fossa [phone]) on 11/15/2017 12:58:52 PM       Radiology Dg Chest 2 View  Result Date: 11/15/2017 CLINICAL DATA:  Chest pain. EXAM: CHEST  2 VIEW COMPARISON:  Radiographs of August 01, 2015. FINDINGS: The heart  size and mediastinal contours are within normal limits. Both lungs are clear. No pneumothorax or pleural effusion is noted. The visualized skeletal structures are unremarkable. IMPRESSION: No active cardiopulmonary disease. Electronically Signed   By: Lupita Raider, M.D.   On: 11/15/2017 10:05   Ct Angio Chest Pe W/cm &/or Wo Cm  Result Date: 11/15/2017 CLINICAL DATA:  27 y/o M; central chest pain radiating into the left lower abdomen. Nausea and vomiting. History of HIV, smoking, and polysubstance use. EXAM: CT ANGIOGRAPHY CHEST CT  ABDOMEN AND PELVIS WITH CONTRAST TECHNIQUE: Multidetector CT imaging of the chest was performed using the standard protocol during bolus administration of intravenous contrast. Multiplanar CT image reconstructions and MIPs were obtained to evaluate the vascular anatomy. Multidetector CT imaging of the abdomen and pelvis was performed using the standard protocol during bolus administration of intravenous contrast. CONTRAST:  ISOVUE-370 IOPAMIDOL (ISOVUE-370) INJECTION 76% COMPARISON:  08/15/2015 CT abdomen and pelvis. FINDINGS: CTA CHEST FINDINGS Cardiovascular: Satisfactory opacification of the pulmonary arteries to the segmental level. No evidence of pulmonary embolism. Normal heart size. No pericardial effusion. Mediastinum/Nodes: No enlarged mediastinal, hilar, or axillary lymph nodes. Thyroid gland, trachea, and esophagus demonstrate no significant findings. Lungs/Pleura: Right upper lobe, left upper lobe lingula, and left lower lobe centrally lucent nodular opacities (series 8, image 70). Small left pleural effusion. Musculoskeletal: No chest wall abnormality. No acute or significant osseous findings. Review of the MIP images confirms the above findings. CT ABDOMEN and PELVIS FINDINGS Hepatobiliary: No focal liver abnormality is seen. No gallstones, gallbladder wall thickening, or biliary dilatation. Pancreas: Unremarkable. No pancreatic ductal dilatation or surrounding  inflammatory changes. Spleen: Normal in size without focal abnormality. Adrenals/Urinary Tract: Adrenal glands are unremarkable. Subcentimeter left kidney interpolar cyst. Kidneys are otherwise normal, without renal calculi, focal lesion, or hydronephrosis. Bladder is unremarkable. Stomach/Bowel: Stomach is within normal limits. Appendix appears normal. No evidence of bowel wall thickening, distention, or inflammatory changes. Vascular/Lymphatic: No significant vascular findings are present. No enlarged abdominal or pelvic lymph nodes. Reproductive: Prostate is unremarkable. Other: No abdominal wall hernia or abnormality. No abdominopelvic ascites. Musculoskeletal: No acute or significant osseous findings. Review of the MIP images confirms the above findings. IMPRESSION: 1. Multiple centrally lucent pulmonary nodules greatest in the left upper lobe lingula likely representing septic emboli. Evaluation for endocarditis is recommended. 2. No pulmonary embolus identified. 3. Small left pleural effusion. 4. Unremarkable CT of abdomen and pelvis. These results were called by telephone at the time of interpretation on 11/15/2017 at 2:49 pm to Dr. Demetrios Loll , who verbally acknowledged these results. Electronically Signed   By: Mitzi Hansen M.D.   On: 11/15/2017 14:51   Ct Abdomen Pelvis W Contrast  Result Date: 11/15/2017 CLINICAL DATA:  27 y/o M; central chest pain radiating into the left lower abdomen. Nausea and vomiting. History of HIV, smoking, and polysubstance use. EXAM: CT ANGIOGRAPHY CHEST CT ABDOMEN AND PELVIS WITH CONTRAST TECHNIQUE: Multidetector CT imaging of the chest was performed using the standard protocol during bolus administration of intravenous contrast. Multiplanar CT image reconstructions and MIPs were obtained to evaluate the vascular anatomy. Multidetector CT imaging of the abdomen and pelvis was performed using the standard protocol during bolus administration of intravenous  contrast. CONTRAST:  ISOVUE-370 IOPAMIDOL (ISOVUE-370) INJECTION 76% COMPARISON:  08/15/2015 CT abdomen and pelvis. FINDINGS: CTA CHEST FINDINGS Cardiovascular: Satisfactory opacification of the pulmonary arteries to the segmental level. No evidence of pulmonary embolism. Normal heart size. No pericardial effusion. Mediastinum/Nodes: No enlarged mediastinal, hilar, or axillary lymph nodes. Thyroid gland, trachea, and esophagus demonstrate no significant findings. Lungs/Pleura: Right upper lobe, left upper lobe lingula, and left lower lobe centrally lucent nodular opacities (series 8, image 70). Small left pleural effusion. Musculoskeletal: No chest wall abnormality. No acute or significant osseous findings. Review of the MIP images confirms the above findings. CT ABDOMEN and PELVIS FINDINGS Hepatobiliary: No focal liver abnormality is seen. No gallstones, gallbladder wall thickening, or biliary dilatation. Pancreas: Unremarkable. No pancreatic ductal dilatation or surrounding inflammatory changes. Spleen: Normal  in size without focal abnormality. Adrenals/Urinary Tract: Adrenal glands are unremarkable. Subcentimeter left kidney interpolar cyst. Kidneys are otherwise normal, without renal calculi, focal lesion, or hydronephrosis. Bladder is unremarkable. Stomach/Bowel: Stomach is within normal limits. Appendix appears normal. No evidence of bowel wall thickening, distention, or inflammatory changes. Vascular/Lymphatic: No significant vascular findings are present. No enlarged abdominal or pelvic lymph nodes. Reproductive: Prostate is unremarkable. Other: No abdominal wall hernia or abnormality. No abdominopelvic ascites. Musculoskeletal: No acute or significant osseous findings. Review of the MIP images confirms the above findings. IMPRESSION: 1. Multiple centrally lucent pulmonary nodules greatest in the left upper lobe lingula likely representing septic emboli. Evaluation for endocarditis is recommended. 2.  No pulmonary embolus identified. 3. Small left pleural effusion. 4. Unremarkable CT of abdomen and pelvis. These results were called by telephone at the time of interpretation on 11/15/2017 at 2:49 pm to Dr. Demetrios Loll , who verbally acknowledged these results. Electronically Signed   By: Mitzi Hansen M.D.   On: 11/15/2017 14:51    Procedures .Critical Care Performed by: Rise Mu, PA-C Authorized by: Rise Mu, PA-C   Critical care provider statement:    Critical care time (minutes):  60   Critical care was necessary to treat or prevent imminent or life-threatening deterioration of the following conditions:  Sepsis   Critical care was time spent personally by me on the following activities:  Development of treatment plan with patient or surrogate, discussions with consultants, discussions with primary provider, evaluation of patient's response to treatment, examination of patient, ordering and performing treatments and interventions, ordering and review of laboratory studies, ordering and review of radiographic studies, pulse oximetry, re-evaluation of patient's condition and review of old charts   (including critical care time)  Medications Ordered in ED Medications  vancomycin (VANCOCIN) 1,500 mg in sodium chloride 0.9 % 500 mL IVPB (1,500 mg Intravenous New Bag/Given 11/15/17 1355)  vancomycin (VANCOCIN) IVPB 750 mg/150 ml premix (not administered)  piperacillin-tazobactam (ZOSYN) IVPB 3.375 g (not administered)  morphine 4 MG/ML injection 4 mg (not administered)  sodium chloride 0.9 % bolus 1,000 mL (0 mLs Intravenous Stopped 11/15/17 1234)    And  sodium chloride 0.9 % bolus 1,000 mL (0 mLs Intravenous Stopped 11/15/17 1453)  acetaminophen (TYLENOL) tablet 1,000 mg (1,000 mg Oral Given 11/15/17 1204)  morphine 4 MG/ML injection 4 mg (4 mg Intravenous Given 11/15/17 1204)  ondansetron (ZOFRAN) injection 4 mg (4 mg Intravenous Given 11/15/17 1204)    piperacillin-tazobactam (ZOSYN) IVPB 3.375 g (0 g Intravenous Stopped 11/15/17 1243)  sodium chloride 0.9 % bolus 500 mL (500 mLs Intravenous New Bag/Given 11/15/17 1353)  iopamidol (ISOVUE-370) 76 % injection (100 mLs  Contrast Given 11/15/17 1418)     Initial Impression / Assessment and Plan / ED Course  I have reviewed the triage vital signs and the nursing notes.  Pertinent labs & imaging results that were available during my care of the patient were reviewed by me and considered in my medical decision making (see chart for details).     Patient resents to the ED with complaints of left-sided pleuritic chest pain onset yesterday.  Patient with history of IV drug use with poor outpatient follow-up along with history of IV drug use.  Last CD4 count was 800 approximately 6 months ago.  Denies any other infectious complaints.  On exam patient does appear uncomfortable and is toxic appearing.  Patient is tachycardic and febrile in the ED.  No hypoxia noted.  Mild tachypnea  noted.  Patient is not hypotensive.  On exam tachycardia is noted without any appreciable murmur.  Patient does have rhonchorous lung sounds throughout all lung fields.  No tenderness to palpation of the abdomen.  No lower extremity edema or calf tenderness.  Neurovascular intact in all extremities.  Initial blood work reveals a leukocytosis of 21,000.  This along with patient's fever and tachycardia sepsis protocol was initiated.  30 cc/kg fluid bolus was given along with broad-spectrum antibiotics.  ID cultures were obtained prior to antibiotic initiation.  Patient lactic acid was initially 2.1 and elevated to 3.2.  Has since resolved 2.99 after 30 cc/kg fluid bolus.  He does not show any acute changes except for tachycardia.  No signs of ST changes or ischemia.  Patient's troponin was negative.  Patient's d-dimer was negative.  Influenza was negative.  UA is pending at this time however doubt this is the source of  infection.  Although patient's d-dimer was negative was concern for possible septic emboli given patient's IV drug use and pleuritic chest pain.  CTA of chest along with abdomen was ordered.  There is no acute changes in the abdomen however radiologist informed me that patient does have small what appears to be septic emboli in the left lingula and concern for endocarditis.  Again patient was started on broad-spectrum antibiotics.  Pain is been controlled in the ED.  I did consult with internal medicine teaching service for admission and they agreed to admission will see patient in the ED.  Patient is hemodynamic stable this time and was updated on plan of care.  Patient has requested that his partner and family not be aware of his HIV status.  Patient was also seen and evaluated by attending who is agreed with the above plan.    Final Clinical Impressions(s) / ED Diagnoses   Final diagnoses:  Sepsis, due to unspecified organism [facility])  Pleuritic chest pain    ED Discharge Orders    None       Wallace Keller 11/15/17 1530    Rise Mu, PA-C 11/15/17 1531    Tilden Fossa, MD 11/19/17 1244

## 2017-11-15 NOTE — Progress Notes (Signed)
Pharmacy Antibiotic Note  Pedro Owens is a 27 y.o. male admitted on 11/15/2017 with sepsis.  Pharmacy has been consulted for vancomycin and Zosyn dosing.  Plan: -vancomycin load of 1500mg  then 750mg  every 8 hours  -Zosyn 3.375 grams 30 min infusion x 1  -Zosyn 3.375 grams 4 hour infusion every 8 hours  -Monitor clinical progression, renal function, and trough as needed  Height: 5\' 11"  (180.3 cm) Weight: 168 lb (76.2 kg) IBW/kg (Calculated) : 75.3  Temp (24hrs), Avg:100.6 F (38.1 C), Min:100.2 F (37.9 C), Max:100.9 F (38.3 C)  Recent Labs  Lab 11/15/17 0936 11/15/17 1014  WBC 21.2*  --   CREATININE 1.24  --   LATICACIDVEN  --  2.36*    Estimated Creatinine Clearance: 97 mL/min (by C-G formula based on SCr of 1.24 mg/dL).    Allergies  Allergen Reactions  . Acetaminophen Diarrhea    Flares up crohns disease  . Other     IV CONTRAST  . Vicodin [Hydrocodone-Acetaminophen] Nausea And Vomiting  . Dilaudid [Hydromorphone Hcl] Hives and Rash    Antimicrobials this admission: 1/28 vancomycin >>  1/28 Zosyn >>   Dose adjustments this admission: N/A  Microbiology results: 1/28 BCx: x 2 pending   Thank you for allowing pharmacy to be a part of this patient's care.  Harlow Asa, PharmD 11/15/2017 12:08 PM

## 2017-11-15 NOTE — H&P (Signed)
Date: 11/15/2017               Patient Name:  Pedro Owens MRN: [id number]  DOB: 1991-01-15 Age / Sex: 27 y.o., male   PCP: Vonna Drafts, FNP         Medical Service: Internal Medicine Teaching Service         Attending Physician: Dr. Aldine Contes, MD    First Contact: Dr. Thomasene Ripple Pager: [phone]  Second Contact: Dr. Ledell Noss Pager: [phone]       After Hours (After 5p/  First Contact Pager: [phone]  weekends / holidays): Second Contact Pager: [phone]   Chief Complaint: Pleuritic chest pain, nausea and vomiting  History of Present Illness:  Pedro Owens is a 27 yo with PMH of HIV (last CD4 = 800, VL = 2,050 in 12/2016) and current IV drug use who presents for evaluation of chest pain, nausea, and vomiting. The patient states first noticed the pain the evening prior to admission while packing up belongings at home. The pain was described as sharp, sudden in onset, worse with inspiration, and radiating along the ribs on the L side. Initially it was not associated with diaphoresis, nausea, or shortness of breath but the pain has been constant since it was first noticed and unrelenting through the night. Now the pain is so severe that it is associated with diaphoresis and nausea/vomiting. The patient is experiencing difficulty breathing and feeling like she is getting enough air, because the pain becomes most severe with inspiration. Since the pain worsened over the 12 hours leading up to admission, the patient decided to come to the ED for evaluation.   At baseline the patient is a daily IV drug user, who has tried abstaining from heroin without success. The patient went to rehabilitation in July but has since used needles to inject [employer]" and cocaine daily into forearms. Most recently, 5 days ago the patient used heroin for the first time since leaving rehab. The patient has also been off all HIV medications since leaving the clinic in July. Patient found out that she  was hepatitis C positive and syphilis positive after leaving the clinic and doesn't think treatment for these conditions has been received.  Upon arrival to the ED the patient was found to be febrile to 38.3C, tachycardic to 103, tachypneic on RA, and normotensive. WBC count was elevated to 21.2, with neutrophil predominance. Lactic acid was elevated to 2.36, with repeat of 3.13. His lactic acid down trended to 0.99 after 2.5L NS bolus. Patient's d-dimer was <0.27. CMP was unremarkable. CT chest angiography showed multiple pulmonary nodules in the left upper lobe lingula that likely represented septic emboli. Blood cultures were obtained and IMTS was called for admission.  Meds:  No outpatient medications have been marked as taking for the 11/15/17 encounter [facility] Encounter).   Allergies: Allergies as of 11/15/2017 - Review Complete 11/15/2017  Allergen Reaction Noted  . Acetaminophen Diarrhea 04/08/2016  . Other  11/04/2016  . Vicodin [hydrocodone-acetaminophen] Nausea And Vomiting 11/19/2013  . Dilaudid [hydromorphone hcl] Hives and Rash 09/24/2010   Past Medical History: Past Medical History:  Diagnosis Date  . ADHD (attention deficit hyperactivity disorder)   . Anal warts   . Anxiety   . Asthma   . Bipolar disorder ([place])   . Chronic bronchitis ([place])   . Chronic pain syndrome   . Convulsions ([place]) 08/14/2015  . Depression   . Gonorrhea 09/08/11  . Headache    "  weekly" (11/06/2016)  . Hemorrhoids, internal, with bleeding 08/25/2011  . Hepatitis C   . Herpes   . HIV infection ([place])   . Hypertension   . ILEITIS 09/24/2010   Qualifier: Diagnosis of  By: Nelson-Smith CMA (AAMA), Dottie    . Lymphoid hyperplasia, reactive   . Marijuana abuse   . Migraine    "monthly" (11/06/2016)  . Pseudoseizures   . Seizures ([place])    "don't know why I have them" (11/06/2016)  . Syphilis    Past Surgical History: Past Surgical History:  Procedure Laterality Date  . ANKLE SURGERY Bilateral  2005   Screw & Pins  . ANKLE SURGERY Right 2008   "replaced pins & screws"  . FOOT SURGERY Bilateral 2005   "bone spurs"  . I&D EXTREMITY Right 06/26/2016   Procedure: IRRIGATION AND DEBRIDEMENT RIGHT FOREARM;  Surgeon: Leanora Cover, MD;  Location: [place];  Service: Orthopedics;  Laterality: Right;  . I&D EXTREMITY Right 11/04/2016   Procedure: IRRIGATION AND DEBRIDEMENT EXTREMITY;  Surgeon: Charlotte Crumb, MD;  Location: [place];  Service: Orthopedics;  Laterality: Right;  . I&D EXTREMITY Right 11/06/2016   Procedure: IRRIGATION AND DEBRIDEMENT right forarm;  Surgeon: Charlotte Crumb, MD;  Location: [place];  Service: Orthopedics;  Laterality: Right;   Family History:  Family History  Problem Relation Age of Onset  . Diabetes Mother   . Irritable bowel syndrome Mother   . Hypertension Mother   . Hyperlipidemia Mother   . Hypothyroidism Mother   . Diabetes Maternal Uncle   . Heart disease Maternal Grandmother   . Leukemia Other        maternal greatgrandmother  . Colon cancer Neg Hx    Social History:  Patient identifies as male. Currently monogamous with male partner, uses condoms for production. Current daily IV drug use since age of 35, injecting "molly" and cocaine, with recent heroin use. Daily smoker of 1.5 PPD for past 8 years.   Review of Systems: A complete ROS was negative except as per HPI.   Physical Exam: Blood pressure 131/80, pulse 100, temperature (!) 100.9 F (38.3 C), resp. rate (!) 22, height '5\' 11"'$  (1.803 m), weight 168 lb (76.2 kg), SpO2 100 %.  Physical Exam  Constitutional: He appears well-developed and well-nourished. No distress.  HENT:  Mouth/Throat: Oropharynx is clear and moist. No oropharyngeal exudate.  Cardiovascular: Normal rate, regular rhythm and intact distal pulses. Exam reveals no friction rub.  No murmur heard. Respiratory: Effort normal. No respiratory distress. He has no wheezes. He has no rales.  GI: Soft. Bowel sounds are normal. He  exhibits no distension. There is no tenderness. There is no rebound.  Musculoskeletal: He exhibits no edema (of bilateral lower extremities) or tenderness (of bilateral lower extremities).  Lymphadenopathy:    He has no cervical adenopathy.  Skin: He is not diaphoretic.  Multiple well healed scars on forearms and on bilateral feet. No obvious areas of erythema or injection sites. NO abscesses or cellulitis noted.   Psychiatric: He has a normal mood and affect. His behavior is normal.   EKG: personally reviewed my interpretation is normal sinus rhythm without signs of ST elevation or TWI to suggest ischemia.  CXR: personally reviewed my interpretation is no evidence of pulmonary infiltration, pleural effusions, or vascular congestion to suggest acute cardiopulmonary process.  Assessment & Plan by Problem: Active Problems:   SIRS (systemic inflammatory response syndrome) (HCC)  Pedro Owens is a 27 yo with PMH of HIV (last CD4 =  800, VL = 2,050 in 12/2016) and current IV drug use who presents for evaluation of fever. She met SIRS criteria on admission with evidence of septic emboli on chest CT angiography, concerning for septic embolism. She was admitted to the internal medicine teaching service with cardiology consulting. The specific problems addressed during admission are as follows:  SIRS with evidence of pulmonary septic emboli consistent with endocarditis: Patient febrile to 38.3C and also presenting with tachypnea, tachycardia, and evidence of septic emboli on CT angiography. Patient's chest pain sounds pleuritic in nature. Patient does not currently have an oxygen requirement and breathing comfortably on exam, which is reassuring. Case already discussed with ID for their recommendations regarding antibiotic therapy. They suggest vancomycin and ceftriaxone for MRSA and additional HACEK coverage given high risk of bacterial endocarditis (currently meets 3 minor criteria). Patient will need  transesophageal echocardiography to rule out endocarditis. Will plan to consult cardiology in AM to assist with rule out and see if TTE needed prior to TEE or go straight to TEE. -Formal infectious disease consult in AM -Continue vancomycin, ceftriaxone -F/u blood cultures taken in ED -Consult cardiology for TEE to r/o endocarditis in AM  HIV: Patient's last CD4 count = 800 and viral load 2,050 in March 2018, but patient has not had follow up since those labs. Patient has not been taking ART for at least 6 months, since leaving rehabilitation. Patient currently in sexual relationship with male partner who is unaware of patient's HIV status and asked that we do not speak in front of him. Concerned for partner and will discuss case with ID for their recommendations. -Formal infectious disease consult in AM -CD4 count and viral load -Hepatitis B/C serologies and syphilis testing  Hx of polysubstance use: If patient has bacteremia and possible endocarditis, patient will likely need IV antibiotic therapy. This is concerning given endorsement of IV drug use on admission. If patient were to need IV antibiotics, he will likely need to stay inpatient for this therapy given increased risk of line manipulation in this patient population. Patient wants to stop using, will need additional resource and further evaluation for suboxone treatment.  -F/u UDS taken in ED -SW for active drug use resources -Continue counseling/evaluation for suboxone clinic  FEN/GI: -Regular diet -No IVF, replace electrolytes as needed -Protonix  VTE Prophylaxis: Lovenox daily Code Status: Full  Dispo: Admit patient to Observation with expected length of stay less than 2 midnights.  SignedThomasene Ripple, MD 11/15/2017, 3:17 PM  Pager: [phone]

## 2017-11-15 NOTE — ED Triage Notes (Addendum)
Pt endorses  Central cp that goes down to left lower abdominal. Pain started yesterday and has progressively moved upwards. Pt sts chest pain aggravated by deep inspiration. Pt endorses nausea and vomiting. Pt sts pain in chest is worse with movement and she has to lean forward to take a deep breath. Pt denies taking anything OTC for pain. Pt sts called EMS yesterday and refused transport. Presents today due to worsening pain. +HIV

## 2017-11-15 NOTE — ED Notes (Signed)
Patient transported to CT 

## 2017-11-15 NOTE — ED Notes (Signed)
Pt greatly improved.  Texting on phone.  No longer crying.

## 2017-11-15 NOTE — ED Notes (Signed)
Meal tray ordered 

## 2017-11-15 NOTE — ED Notes (Signed)
Pt placed on 2L [place] for comfort.  States O2 improving pain.

## 2017-11-16 DIAGNOSIS — Z886 Allergy status to analgesic agent status: Secondary | ICD-10-CM

## 2017-11-16 DIAGNOSIS — F1190 Opioid use, unspecified, uncomplicated: Secondary | ICD-10-CM

## 2017-11-16 DIAGNOSIS — I76 Septic arterial embolism: Secondary | ICD-10-CM

## 2017-11-16 DIAGNOSIS — Z21 Asymptomatic human immunodeficiency virus [HIV] infection status: Secondary | ICD-10-CM

## 2017-11-16 DIAGNOSIS — F1590 Other stimulant use, unspecified, uncomplicated: Secondary | ICD-10-CM

## 2017-11-16 DIAGNOSIS — F1490 Cocaine use, unspecified, uncomplicated: Secondary | ICD-10-CM

## 2017-11-16 DIAGNOSIS — Z91041 Radiographic dye allergy status: Secondary | ICD-10-CM

## 2017-11-16 DIAGNOSIS — F1990 Other psychoactive substance use, unspecified, uncomplicated: Secondary | ICD-10-CM

## 2017-11-16 DIAGNOSIS — F17210 Nicotine dependence, cigarettes, uncomplicated: Secondary | ICD-10-CM

## 2017-11-16 DIAGNOSIS — Z885 Allergy status to narcotic agent status: Secondary | ICD-10-CM

## 2017-11-16 DIAGNOSIS — I2690 Septic pulmonary embolism without acute cor pulmonale: Secondary | ICD-10-CM

## 2017-11-16 LAB — BASIC METABOLIC PANEL WITH GFR
Anion gap: 8 (ref 5–15)
BUN: 12 mg/dL (ref 6–20)
CO2: 22 mmol/L (ref 22–32)
Calcium: 8.3 mg/dL — ABNORMAL LOW (ref 8.9–10.3)
Chloride: 107 mmol/L (ref 101–111)
Creatinine, Ser: 0.96 mg/dL (ref 0.61–1.24)
GFR calc Af Amer: >60 mL/min
GFR calc non Af Amer: >60 mL/min
Glucose, Bld: 79 mg/dL (ref 65–99)
Potassium: 3.2 mmol/L — ABNORMAL LOW (ref 3.5–5.1)
Sodium: 137 mmol/L (ref 135–145)

## 2017-11-16 LAB — SYPHILIS: RPR W/REFLEX TO RPR TITER AND TREPONEMAL ANTIBODIES, TRADITIONAL SCREENING AND DIAGNOSIS ALGORITHM: RPR Ser Ql: NONREACTIVE

## 2017-11-16 LAB — CBC
HCT: 38.6 % — ABNORMAL LOW (ref 39.0–52.0)
Hemoglobin: 12.7 g/dL — ABNORMAL LOW (ref 13.0–17.0)
MCH: 28 pg (ref 26.0–34.0)
MCHC: 32.9 g/dL (ref 30.0–36.0)
MCV: 85 fL (ref 78.0–100.0)
Platelets: 261 10*3/uL (ref 150–400)
RBC: 4.54 MIL/uL (ref 4.22–5.81)
RDW: 13.9 % (ref 11.5–15.5)
WBC: 20 10*3/uL — ABNORMAL HIGH (ref 4.0–10.5)

## 2017-11-16 MED ORDER — POTASSIUM CHLORIDE CRYS ER 20 MEQ PO TBCR
40.0000 meq | EXTENDED_RELEASE_TABLET | Freq: Two times a day (BID) | ORAL | Status: DC
  Administered 2017-11-16 (×2): 40 meq via ORAL
  Filled 2017-11-16 (×2): qty 2

## 2017-11-16 MED ORDER — GI COCKTAIL ~~LOC~~
30.0000 mL | Freq: Three times a day (TID) | ORAL | Status: DC | PRN
  Administered 2017-11-16 – 2017-11-22 (×2): 30 mL via ORAL
  Filled 2017-11-16 (×2): qty 30

## 2017-11-16 MED ORDER — KETOROLAC TROMETHAMINE 15 MG/ML IJ SOLN: 15.0000 mg | Freq: Four times a day (QID) | INTRAMUSCULAR | Status: DC

## 2017-11-16 MED ORDER — RAMELTEON 8 MG PO TABS
8.0000 mg | ORAL_TABLET | Freq: Once | ORAL | Status: AC | PRN
  Administered 2017-11-16: 8 mg via ORAL
  Filled 2017-11-16: qty 1

## 2017-11-16 MED ORDER — KETOROLAC TROMETHAMINE 30 MG/ML IJ SOLN
30.0000 mg | Freq: Once | INTRAMUSCULAR | Status: AC
  Administered 2017-11-16: 30 mg via INTRAVENOUS
  Filled 2017-11-16: qty 1

## 2017-11-16 MED ORDER — KETOROLAC TROMETHAMINE 15 MG/ML IJ SOLN
15.0000 mg | Freq: Four times a day (QID) | INTRAMUSCULAR | Status: DC
  Administered 2017-11-16 – 2017-11-17 (×3): 15 mg via INTRAVENOUS
  Filled 2017-11-16 (×4): qty 1

## 2017-11-16 NOTE — Progress Notes (Addendum)
   Subjective:  Patient seen laying in bed in no acute distress. States 10/10 pain with inspiration, but breathing comfortably. States Tylenol will not touch pain. Patient has no other chills, SOB since arriving to hospital.   Objective:  Vital signs in last 24 hours: Vitals:   11/15/17 1815 11/15/17 1830 11/15/17 2027 11/16/17 0456  BP:   126/64 116/73  Pulse: 88 91 91 78  Resp: (!) 28 (!) 33 18 18  Temp:   98.1 F (36.7 C) 98.1 F (36.7 C)  TempSrc:      SpO2: 100% 100% 100% 100%  Weight:      Height:       Physical Exam  Constitutional: He appears well-developed and well-nourished. No distress.  HENT:  Mouth/Throat: Oropharynx is clear and moist. No oropharyngeal exudate.  Cardiovascular: Normal rate, regular rhythm and intact distal pulses. Exam reveals no friction rub.  No murmur heard. Respiratory:  Poor effort with exam. No accessory muscle use or nasal flaring. Lungs clear to auscultation bilaterally.  GI: Soft. Bowel sounds are normal. He exhibits no distension. There is no tenderness. There is no rebound.  Skin: Skin is warm and dry. No rash noted. He is not diaphoretic. No erythema.  Multiple well healed    Assessment/Plan:  Active Problems:   SIRS (systemic inflammatory response syndrome) (HCC)  Pedro Owens is a 27 yo with PMH of HIV (last CD4 = 800, VL = 2,050 in 12/2016), off ART for 6 months, and current IV drug use who presented with fever and leukocytosis who was found to have imaging consistent with pulmonary septic emboli on admission. The patient was admitted to the internal medicine teaching service with infectious disease and cardiology consulting. The specific problems addressed during admission are as follows:  Pulmonary septic emboli consistent with endocarditis: Patient afebrile and hemodynamically stable since admission. Blood cultures no growth right now, but will continue to follow. Patient will need TEE to rule out infectious endocarditis given  evidence of septic emboli on admission. Avoiding IV narcotics given history of substance use and managing pain with scheduled Toradol.  -Infectious disease consulted, recommendations appreciated -Cardiology following for TEE, assistance appreciated -Continue vancomycin, ceftriaxone -Blood cultures no growth at 24 hours -Pain control: scheduled Toradol 15 mg q6 hours, can give extra dose for PRN if needed  HIV: Patient's last CD4 count = 800 and viral load 2,050 in March 2018, but has not taken ART for 6 months. Patient continues to want to withhold HIV status from partner, will contact Health Department as required. -Infectious disease consulted, recommendations appreciated -CD4 count and viral load pending -Hepatitis B/C serologies and syphilis testing pending  Hx of polysubstance use: UDS + for amphetamines, opiates, cocaine, THC. Patient's UDS taken after receiving morphine in ED, but recent admits to heroin use as well. -Monitor clinically for signs/symptoms of withdrawal while inpatient -Avoid IV and oral narcotic pain medications -SW for active drug use resources -Continue counseling while on rounds  FEN/GI: -Regular diet -No IVF, oral K+ replacement today  VTE Prophylaxis: Lovenox daily Code Status: Full  Dispo: Anticipated discharge in approximately 2-3 day(s) pending workup for endocarditis.  Rozann Lesches, MD 11/16/2017, 9:55 AM Pager: [phone]

## 2017-11-16 NOTE — Progress Notes (Signed)
Presented AD forms to patient and explained if questions or want to complete the forms, to call chaplain or have nurse call. Phebe Colla, Chaplain   11/16/17 1100  Clinical Encounter Type  Visited With Patient;Other (Comment) (one other male friend there)  Visit Type Initial;Other (Comment) (AD forms given and call chaplain to complete/questions)  Referral From Physician  Consult/Referral To Chaplain  Stress Factors  Patient Stress Factors None identified  Family Stress Factors None identified

## 2017-11-16 NOTE — Progress Notes (Signed)
Notified on-call provider that Pt C/O 10/10 pain. Groaning and holding abdomen. States that pain is spreading and points to mid-sternal and epigastric region down to abdomen.  GI Cocktail ordered.

## 2017-11-16 NOTE — H&P (Signed)
[facility]   Reason for Consult: HIV+, septic pulmonary emboli     Referring Physician: IMTS  Active Problems:   SIRS (systemic inflammatory response syndrome) (HCC)   . enoxaparin (LOVENOX) injection  40 mg Subcutaneous Q24H  . Influenza vac split quadrivalent PF  0.5 mL Intramuscular Tomorrow-1000  . ketorolac  15 mg Intravenous Q6H  . nicotine  21 mg Transdermal Daily  . pneumococcal 23 valent vaccine  0.5 mL Intramuscular Tomorrow-1000  . potassium chloride  40 mEq Oral BID    Recommendations: Continue empiric IV vanc and ceftriaxone for presumed endocarditis. F/u blood cultures. Please obtain TEE. Will f/u CD4 and HIV RNA quant. Patient agreeable to restarting ART prior to discharge. Pending CD4 count may need PCP prophylaxis.   Assessment: 27 yo transgender male with HIV off antiretrovirals and IVDU presenting with chest pain found to have septic pulmonary emboli concerning for endocarditis.   Antibiotics: 1/28 IV Vanc >>  1/28 IV Zosyn x 1 dose >> dc'd  1/28 IV Ceftriaxone >>    Cultures: 1/28 Blood >> NG 24 hours   HPI: Pedro Owens is a 27 y.o. transgender male with a history of HIV off antiretrovirals and IVDU presenting 1/28 with acute onset chest pain after injecting drugs earlier that day. Patient denies fevers / chills at home. On arrival to the ED patient was febrile to T 100.9 but hemodynamically stable with BP 131/76, HR 96, RR 30, and oxygen 100% on RA. Labs notable for a leukocytosis of 21.2, CMP unremarkable, lactic acid 2.36. CT chest demonstrated pulmonary nodules concerning for septic emboli. Blood cultures were collected. UDS was positive for opiates, cocaine, amphetamines, and THC. CD4 count, HIV RNA quant, Hep B, Hep C, and RPR are pending at this time.   Interval History: Complaining of chest pain this morning. Denies fevers / chills.   Review of Systems: Review of Systems  Constitutional: Negative for chills and  fever.  Respiratory: Negative for cough, hemoptysis and shortness of breath.   Cardiovascular: Positive for chest pain.  Gastrointestinal: Negative for abdominal pain, nausea and vomiting.  Musculoskeletal: Positive for back pain.  Neurological: Negative for dizziness.   All other systems reviewed and are negative    Past Medical History:  Diagnosis Date  . ADHD (attention deficit hyperactivity disorder)   . Anal warts   . Anxiety   . Asthma   . Bipolar disorder (HCC)   . Chronic bronchitis (HCC)   . Chronic pain syndrome   . Convulsions (HCC) 08/14/2015  . Depression   . Gonorrhea 09/08/11  . Headache    "weekly" (11/06/2016)  . Hemorrhoids, internal, with bleeding 08/25/2011  . Hepatitis C   . Herpes   . HIV infection (HCC)   . Hypertension   . ILEITIS 09/24/2010   Qualifier: Diagnosis of  By: Nelson-Smith CMA (AAMA), Dottie    . Lymphoid hyperplasia, reactive   . Marijuana abuse   . Migraine    "monthly" (11/06/2016)  . Pseudoseizures   . Seizures (HCC)    "don't know why I have them" (11/06/2016)  . Syphilis     Social History   Tobacco Use  . Smoking status: Current Every Day Smoker    Packs/day: 1.50    Years: 11.00    Pack years: 16.50    Types: Cigarettes  . Smokeless tobacco: Never Used  Substance Use Topics  . Alcohol use: Yes    Comment: 11/06/2016 "nothing in a very long  time"  . Drug use: Yes    Frequency: 2.0 times per week    Types: Cocaine, Benzodiazepines, Oxycodone, Marijuana, Heroin    Comment: yesterday    Family History  Problem Relation Age of Onset  . Diabetes Mother   . Irritable bowel syndrome Mother   . Hypertension Mother   . Hyperlipidemia Mother   . Hypothyroidism Mother   . Diabetes Maternal Uncle   . Heart disease Maternal Grandmother   . Leukemia Other        maternal greatgrandmother  . Colon cancer Neg Hx     Allergies  Allergen Reactions  . Acetaminophen Diarrhea    Flares up crohns disease  . Other     IV  CONTRAST  . Vicodin [Hydrocodone-Acetaminophen] Nausea And Vomiting  . Dilaudid [Hydromorphone Hcl] Hives and Rash    Physical Exam:  Vitals:   11/15/17 2027 11/16/17 0456  BP: 126/64 116/73  Pulse: 91 78  Resp: 18 18  Temp: 98.1 F (36.7 C) 98.1 F (36.7 C)  SpO2: 100% 100%   Constitutional: NAD, appears comfortable HEENT: Atraumatic, normocephalic. PERRL, anicteric sclera.  Neck: Supple, trachea midline.  Cardiovascular: RRR, no murmurs, rubs, or gallops.  Pulmonary/Chest: CTAB, no wheezes, rales, or rhonchi.  Abdominal: Soft, non tender, non distended. +BS.  Extremities: Warm and well perfused. No edema.  Neurological: A&Ox3, CN II - XII grossly intact.  Skin: No rashes or erythema  Psychiatric: Normal mood and affect   Lab Results  Component Value Date   WBC 20.0 (H) 11/16/2017   HGB 12.7 (L) 11/16/2017   HCT 38.6 (L) 11/16/2017   MCV 85.0 11/16/2017   PLT 261 11/16/2017    Lab Results  Component Value Date   CREATININE 0.96 11/16/2017   BUN 12 11/16/2017   NA 137 11/16/2017   K 3.2 (L) 11/16/2017   CL 107 11/16/2017   CO2 22 11/16/2017    Lab Results  Component Value Date   ALT 42 11/15/2017   AST 41 11/15/2017   ALKPHOS 68 11/15/2017     Microbiology: Recent Results (from the past 240 hour(s))  Blood Culture (routine x 2)     Status: None (Preliminary result)   Collection Time: 11/15/17 12:15 PM  Result Value Ref Range Status   Specimen Description BLOOD RIGHT ANTECUBITAL  Final   Special Requests   Final    BOTTLES DRAWN AEROBIC AND ANAEROBIC Blood Culture adequate volume   Culture NO GROWTH < 24 HOURS  Final   Report Status PENDING  Incomplete  Blood Culture (routine x 2)     Status: None (Preliminary result)   Collection Time: 11/15/17 12:50 PM  Result Value Ref Range Status   Specimen Description BLOOD LEFT HAND  Final   Special Requests   Final    BOTTLES DRAWN AEROBIC AND ANAEROBIC Blood Culture results may not be optimal due to an  inadequate volume of blood received in culture bottles   Culture NO GROWTH < 24 HOURS  Final   Report Status PENDING  Incomplete    [name], MD - PGY2  [facility] [email or web address] [phone] pager  [phone] cell 11/16/2017, 10:54 AM

## 2017-11-16 NOTE — Progress Notes (Signed)
  Date: 11/16/2017  Patient name: Pedro Owens  Medical record number: [id number]  Date of birth: 09/29/1991   I have seen and evaluated Jovita Gamma and discussed their care with the Residency Team.  In brief, patient is a 27 year old with a past medical history of HIV and current IV drug use who presented to the ED with worsening chest pain and nausea and vomiting.  The patient states that the pain first started on the evening prior to admission and was sharp, constant, pleuritic in nature and sudden in onset and radiated along his ribs to the left side, 10 out of 10 intensity and associated with diaphoresis, shortness of breath and nausea and vomiting.  Patient is a daily IV drug user who has been injecting cocaine and "[name]" daily into her forearms and approximately 5 days ago used heroin as well.  Patient has been noncompliant with all HIV medications since July of last year.  No lightheadedness, no syncope, no focal weakness, no diarrhea, no abdominal pain.  Patient does have associated fevers and chills and was found to be febrile to 100.9 F in the ED.  Today patient complains of persistent left-sided chest pain and that Tylenol does not help.  PMHx, Fam Hx, and/or Soc Hx : As per resident admit note  Vitals:   11/15/17 2027 11/16/17 0456  BP: 126/64 116/73  Pulse: 91 78  Resp: 18 18  Temp: 98.1 F (36.7 C) 98.1 F (36.7 C)  SpO2: 100% 100%   General: Awake alert and oriented x3, NAD CVS: Regular rate and rhythm, normal heart sounds Lungs: CTA bilaterally Abdomen: Soft, nontender, nondistended, normoactive bowel sounds Extremities: No edema noted  Assessment and Plan: I have seen and evaluated the patient as outlined above. I agree with the formulated Assessment and Plan as detailed in the residents' note, with the following changes:   1.  Likely sepsis secondary to endocarditis with septic pulmonary emboli: -The patient presents the ED with sudden onset of left-sided  chest pain associated with shortness of breath and nausea and vomiting in the setting of being noncompliant with her HIV medications and daily IV drug use and was found to have likely septic pulmonary emboli on imaging, leukocytosis up to 20 and fevers up to 101 F with elevated lactic acid up to 3.13 consistent with sepsis which is likely secondary to endocarditis.. -ID consult and recommendations appreciated -Continue with ceftriaxone and vancomycin for now to cover for possible endocarditis -Blood cultures with no growth to date -Continue with Toradol for pain control.  Would attempt to avoid opiates given history of substance abuse -We will follow-up CD4 count and viral load.  Hep B and C serologies as well as syphilis testing -Patient continues to want to withhold her HIV status from her partner.  Will contact health department to inform them of her status -Social work to be consulted for history of polysubstance abuse.  UDS positive for amphetamines, opiates and cocaine as well as THC -We will follow up TEE to rule out endocarditis  Earl Lagos, MD 1/29/201912:13 PM

## 2017-11-17 DIAGNOSIS — B20 Human immunodeficiency virus [HIV] disease: Secondary | ICD-10-CM

## 2017-11-17 DIAGNOSIS — Z9114 Patient's other noncompliance with medication regimen: Secondary | ICD-10-CM

## 2017-11-17 DIAGNOSIS — D72829 Elevated white blood cell count, unspecified: Secondary | ICD-10-CM

## 2017-11-17 LAB — CBC
HEMATOCRIT: 40.8 % (ref 39.0–52.0)
HEMOGLOBIN: 13.8 g/dL (ref 13.0–17.0)
MCH: 28.6 pg (ref 26.0–34.0)
MCHC: 33.8 g/dL (ref 30.0–36.0)
MCV: 84.5 fL (ref 78.0–100.0)
Platelets: 282 10*3/uL (ref 150–400)
RBC: 4.83 MIL/uL (ref 4.22–5.81)
RDW: 13.4 % (ref 11.5–15.5)
WBC: 21.6 10*3/uL — ABNORMAL HIGH (ref 4.0–10.5)

## 2017-11-17 LAB — BASIC METABOLIC PANEL
ANION GAP: 8 (ref 5–15)
BUN: 11 mg/dL (ref 6–20)
CO2: 21 mmol/L — AB (ref 22–32)
Calcium: 8.2 mg/dL — ABNORMAL LOW (ref 8.9–10.3)
Chloride: 104 mmol/L (ref 101–111)
Creatinine, Ser: 0.85 mg/dL (ref 0.61–1.24)
GFR calc Af Amer: >60 mL/min (ref >60)
GLUCOSE: 145 mg/dL — AB (ref 65–99)
POTASSIUM: 3.6 mmol/L (ref 3.5–5.1)
Sodium: 133 mmol/L — ABNORMAL LOW (ref 135–145)

## 2017-11-17 LAB — HEPATITIS B CORE ANTIBODY, TOTAL: HEP B C TOTAL AB: NEGATIVE

## 2017-11-17 LAB — T-HELPER CELLS (CD4) COUNT (NOT AT ARMC)
CD4 % Helper T Cell: 28 % — ABNORMAL LOW (ref 33–55)
CD4 T Cell Abs: 380 /uL — ABNORMAL LOW (ref 400–2700)

## 2017-11-17 LAB — HEPATITIS B SURFACE ANTIGEN: HEP B S AG: NEGATIVE

## 2017-11-17 LAB — HEPATITIS C ANTIBODY: HCV Ab: >11 s/co ratio — ABNORMAL HIGH (ref 0.0–0.9)

## 2017-11-17 LAB — VANCOMYCIN, TROUGH: Vancomycin Tr: 7 ug/mL — ABNORMAL LOW (ref 15–20)

## 2017-11-17 MED ORDER — RAMELTEON 8 MG PO TABS
8.0000 mg | ORAL_TABLET | Freq: Once | ORAL | Status: AC
  Administered 2017-11-18: 8 mg via ORAL
  Filled 2017-11-17: qty 1

## 2017-11-17 MED ORDER — PANTOPRAZOLE SODIUM 40 MG PO TBEC
40.0000 mg | DELAYED_RELEASE_TABLET | Freq: Every day | ORAL | Status: DC
  Administered 2017-11-17 – 2017-11-22 (×6): 40 mg via ORAL
  Filled 2017-11-17 (×6): qty 1

## 2017-11-17 MED ORDER — LIDOCAINE 5 % EX PTCH
1.0000 | MEDICATED_PATCH | Freq: Every day | CUTANEOUS | Status: DC
  Administered 2017-11-17 – 2017-11-22 (×6): 1 via TRANSDERMAL
  Filled 2017-11-17 (×6): qty 1

## 2017-11-17 MED ORDER — ONDANSETRON 4 MG PO TBDP
4.0000 mg | ORAL_TABLET | Freq: Three times a day (TID) | ORAL | Status: DC | PRN
  Administered 2017-11-17 – 2017-11-20 (×2): 4 mg via ORAL
  Filled 2017-11-17 (×2): qty 1

## 2017-11-17 MED ORDER — TRAMADOL HCL 50 MG PO TABS: 50.0000 mg | ORAL_TABLET | Freq: Three times a day (TID) | ORAL | Status: DC | PRN

## 2017-11-17 MED ORDER — ONDANSETRON HCL 4 MG/2ML IJ SOLN
4.0000 mg | Freq: Three times a day (TID) | INTRAMUSCULAR | Status: DC | PRN
  Administered 2017-11-17: 4 mg via INTRAVENOUS
  Filled 2017-11-17: qty 2

## 2017-11-17 MED ORDER — KETOROLAC TROMETHAMINE 15 MG/ML IJ SOLN
30.0000 mg | Freq: Four times a day (QID) | INTRAMUSCULAR | Status: DC | PRN
  Administered 2017-11-17 – 2017-11-19 (×8): 30 mg via INTRAVENOUS
  Filled 2017-11-17 (×8): qty 2

## 2017-11-17 MED ORDER — VANCOMYCIN HCL IN DEXTROSE 1-5 GM/200ML-% IV SOLN
1000.0000 mg | Freq: Three times a day (TID) | INTRAVENOUS | Status: DC
  Administered 2017-11-17 – 2017-11-19 (×6): 1000 mg via INTRAVENOUS
  Filled 2017-11-17 (×7): qty 200

## 2017-11-17 MED ORDER — DEXTROSE 5 % IV SOLN
2.0000 g | INTRAVENOUS | Status: DC
  Administered 2017-11-18 – 2017-11-19 (×2): 2 g via INTRAVENOUS
  Filled 2017-11-17 (×2): qty 2

## 2017-11-17 MED ORDER — KETOROLAC TROMETHAMINE 15 MG/ML IJ SOLN
15.0000 mg | Freq: Once | INTRAMUSCULAR | Status: AC
  Administered 2017-11-17: 15 mg via INTRAVENOUS

## 2017-11-17 MED ORDER — ABACAVIR-DOLUTEGRAVIR-LAMIVUD 600-50-300 MG PO TABS
1.0000 | ORAL_TABLET | Freq: Every day | ORAL | Status: DC
  Administered 2017-11-17 – 2017-11-22 (×6): 1 via ORAL
  Filled 2017-11-17 (×6): qty 1

## 2017-11-17 NOTE — Progress Notes (Signed)
Pharmacy Antibiotic Note  Pedro Owens is a 26 y.o. male admitted on 11/15/2017 with endocarditis.   Continues on Vancomycin and Ceftriaxone - Day # 3 Vancomycin trough low at 7 this afternoon  TEE planned for Friday, blood cultures pending  Plan: Increase Vancomycin to 1 gram iv Q 8 hours Ceftriaxone 2 grams iv Q 24 hours Continue to follow  Height: 5\' 11"  (180.3 cm) Weight: 168 lb (76.2 kg) IBW/kg (Calculated) : 75.3  Temp (24hrs), Avg:98.7 F (37.1 C), Min:98.2 F (36.8 C), Max:99.3 F (37.4 C)  Recent Labs  Lab 11/15/17 0936 11/15/17 1014 11/15/17 1217 11/15/17 1414 11/16/17 0345 11/17/17 0521 11/17/17 1244  WBC 21.2*  --   --   --  20.0* 21.6*  --   CREATININE 1.24  --   --   --  0.96 0.85  --   LATICACIDVEN  --  2.36* 3.13* 0.99  --   --   --   VANCOTROUGH  --   --   --   --   --   --  7*    Estimated Creatinine Clearance: 141.5 mL/min (by C-G formula based on SCr of 0.85 mg/dL).    Allergies  Allergen Reactions  . Acetaminophen Diarrhea    Flares up crohns disease  . Other     IV CONTRAST  . Vicodin [Hydrocodone-Acetaminophen] Nausea And Vomiting  . Dilaudid [Hydromorphone Hcl] Hives and Rash   Thank you Okey Regal, PharmD [phone] 11/17/2017, 1:48 PM

## 2017-11-17 NOTE — Discharge Summary (Signed)
Name: Pedro Owens MRN: [id number] DOB: [birth year] 27 y.o. PCP: Diamantina Providence, FNP  Date of Admission: 11/15/2017 10:48 AM Date of Discharge: 11/22/2017 Attending Physician: Levert Feinstein, MD  Discharge Diagnosis:  Principal Problem:   Endocarditis, suspected Active Problems:   Polysubstance abuse (HCC)   SIRS (systemic inflammatory response syndrome) (HCC)   Hepatitis C antibody test positive   Septic pulmonary embolism (HCC)   Acute endocarditis   Pleuritic chest pain   IVDU (intravenous drug user)   Discharge Medications: Allergies as of 11/22/2017      Reactions   Acetaminophen Diarrhea   Flares up crohns disease   Other    IV CONTRAST   Vicodin [hydrocodone-acetaminophen] Nausea And Vomiting   Dilaudid [hydromorphone Hcl] Hives, Rash      Medication List    STOP taking these medications   methocarbamol 500 MG tablet Commonly known as:  ROBAXIN   naproxen 500 MG tablet Commonly known as:  NAPROSYN   ondansetron 4 MG tablet Commonly known as:  ZOFRAN     TAKE these medications   abacavir-dolutegravir-lamiVUDine 600-50-300 MG tablet Commonly known as:  TRIUMEQ Take 1 tablet by mouth daily.   benzonatate 100 MG capsule Commonly known as:  TESSALON Take 1 capsule (100 mg total) by mouth 3 (three) times daily as needed for cough.   diclofenac sodium 1 % Gel Commonly known as:  VOLTAREN Apply 2 g topically every 6 (six) hours as needed (pain).   levofloxacin 750 MG tablet Commonly known as:  LEVAQUIN Take 1 tablet (750 mg total) by mouth daily.   linezolid 600 MG tablet Commonly known as:  ZYVOX Take 1 tablet (600 mg total) by mouth 2 (two) times daily.   nicotine 14 mg/24hr patch Commonly known as:  NICODERM CQ - dosed in mg/24 hours Place 14 mg onto the skin daily.      Disposition and follow-up:   Mr.Pedro Owens was discharged from [facility] in Stable condition.  At the hospital follow up visit please  address:  1.  -Assess adherence to antibiotic regimen for culture negative endocarditis (end date 3/1) and HAART therapy for HIV  -Encourage to keep all follow up appointments with OUD clinic [facility]) and ID  -Assess for continued cessation of substance use -Repeat BMP to assess kidney function has returned to baseline    2.  Labs / imaging needed at time of follow-up: BMP  3.  Pending labs/ test needing follow-up: Hep C Genotype   Follow-up Appointments: Follow-up Information    Blanchard Kelch, NP Follow up on 12/02/2017.   Specialty:  Infectious Diseases Contact information: [address] [place] [address] [phone]           Hospital Course by problem list:  Pedro Owens is a 27 yo male who identifies as male with PMH of HIV off ART for >6 months and current IV drug use who presented with fever and leukocytosis. The patient was found to have pulmonary septic emboli on admission that were consistent with bacterial endocarditis. The patient was admitted to the internal medicine teaching service with infectious disease consulting. The specific problems addressed during admission are as follows:  Pulmonary septic emboli consistent with endocarditis: Patient presented with fever, leukocytosis, tachypnea and acute onset pleuritic chest pain. She was found to have evidence of pulmonary septic emboli, mainly located in left lingula, on admission that was concerning for endocarditis given history of daily IV drug use. Chest wall  pain related to the emboli were difficult to control-he was eventually placed on suboxone which provided good analgesic response. Pt was started on vancomycin and ceftriaxone to treat possible endocarditis on admission. Blood cultures taken on admission were no growth x 5 days. These cultures were repeated on 11/17/2017 when patient had additional episode of fever and were NG x 4 days at time of discharge.The patient underwent TEE on 11/19/2017 which was negative  for vegetations. Based on these findings, she was treated empirically for culture negative endocarditis. Given her substance use history, ID recommended an oral regimen of Linezolid 600 mg BID and Levofloxacin 750 mg daily until 12/17/2017. Prior to discharge both the antibiotics and her HIV medication was delivered to her room in an effort to improve adherence.  HIV, Hepatitis C: Patient's last CD4 count = 800 and viral load 2,050 in March 2018, but had been off ART for at least 6 months. Patient was previously identified to be infected with hepatitis C as well but did not undergo treatment after this diagnosis. Patient was RPR non-reactive and hepatitis B surface antigen & core antibody negative on admission. HIV viral load was 1,240 with CD4 count of 380. Her hepatitis C RNA was 2.7 million. Since patient out of care for >6 months she was restarted on Triumeq during hospitalization. The patient was sexually active with new partner on presentation for >1 month and admitted to intentionally withholding status of untreated HIV from new partner. The health department was contacted regarding the new sexual contact and the patient's lack of HIV treatment/viral load testing for >6 months on 11/16/2017. The patient will follow up on discharge with infectious disease clinic regarding HIV treatment and future hepatitis C treatment.  Hx of polysubstance use:UDS on admission was positive for amphetamines, opiates, cocaine, THC. Patient's UDS taken after receiving morphine in ED, but on admission patient endorsed heroin use 5 days prior to coming to the ED. Throughout hospitalization patient was counseled about drug cessation by infectious disease and internal medicine providers. Patient was also given a list of community resources to help treat polysubstance use disorder by social work. In order to treat both his chest wall pain and his substance abuse, he was started on suboxone 8-2 daily and subsequently uptitrated to  BID. She was established with next day follow up in the OUD clinic for ongoing management of OUD.    Acute Kidney Injury Pt had a slight bump in his Cr to 1.35 thought to be due to Vancomycin and Toradol administration. These medicines were discontinued and his Cr remained stable ~1.3 on subsequent re-check. Further nephrotoxins were avoided and re-checking renal function on follow up to ensure resolution or establish a new baseline Cr may be beneficial.   Discharge Vitals:   BP 110/69 (BP Location: Left Arm)   Pulse 90   Temp 98.2 F (36.8 C) (Oral)   Resp 16   Ht 5\' 11"  (1.803 m)   Wt 168 lb (76.2 kg)   SpO2 92%   BMI 23.43 kg/m   Pertinent Labs, Studies, and Procedures:   BMP Latest Ref Rng & Units 11/22/2017 11/21/2017 11/20/2017  Glucose 65 - 99 mg/dL - 161(W) 960(A)  BUN 6 - 20 mg/dL - 8 10  Creatinine 5.40 - 1.24 mg/dL 9.81(X) 9.14(N) 8.29(F)  Sodium 135 - 145 mmol/L - 135 136  Potassium 3.5 - 5.1 mmol/L - 4.4 3.0(L)  Chloride 101 - 111 mmol/L - 101 100(L)  CO2 22 - 32 mmol/L - 25 25  Calcium 8.9 - 10.3 mg/dL - 8.3(L) 8.0(L)   CBC Latest Ref Rng & Units 11/21/2017 11/19/2017 11/17/2017  WBC 4.0 - 10.5 K/uL 13.1(H) 12.1(H) 21.6(H)  Hemoglobin 13.0 - 17.0 g/dL 12.3(L) 12.4(L) 13.8  Hematocrit 39.0 - 52.0 % 37.3(L) 36.6(L) 40.8  Platelets 150 - 400 K/uL 416(H) 333 282   Hepatitis B Surface Antigen= Negative Hepatitis B Core Total Ab= Negative  RPR= Nonreactive  Hepatitis C Antibody= >11.0 Hepatitis C RNA= 2,730,000  HIV-1 RNA= 1,240 CD4 Count= 380  Urinalysis    Component Value Date/Time   COLORURINE YELLOW 11/15/2017 1802   APPEARANCEUR CLEAR 11/15/2017 1802   LABSPEC 1.030 11/15/2017 1802   PHURINE 6.0 11/15/2017 1802   GLUCOSEU NEGATIVE 11/15/2017 1802   HGBUR NEGATIVE 11/15/2017 1802   BILIRUBINUR NEGATIVE 11/15/2017 1802   KETONESUR NEGATIVE 11/15/2017 1802   PROTEINUR NEGATIVE 11/15/2017 1802   UROBILINOGEN 1.0 08/04/2015 1935   NITRITE NEGATIVE 11/15/2017  1802   LEUKOCYTESUR NEGATIVE 11/15/2017 1802   Drugs of Abuse     Component Value Date/Time   LABOPIA POSITIVE (A) 11/15/2017 1802   COCAINSCRNUR POSITIVE (A) 11/15/2017 1802   LABBENZ NONE DETECTED 11/15/2017 1802   AMPHETMU POSITIVE (A) 11/15/2017 1802   THCU POSITIVE (A) 11/15/2017 1802   LABBARB NONE DETECTED 11/15/2017 1802   CT Angiography of Chest, CT abdomen and pelvis 11/15/2017 IMPRESSION: 1. Multiple centrally lucent pulmonary nodules greatest in the left upper lobe lingula likely representing septic emboli. Evaluation for endocarditis is recommended. 2. No pulmonary embolus identified. 3. Small left pleural effusion. 4. Unremarkable CT of abdomen and pelvis.  These results were called by telephone at the time of interpretation on 11/15/2017 at 2:49 pm to Dr. Demetrios Loll , who verbally acknowledged these results.  TEE 11/19/2017 Negative for vegetations  Blood Culture x2, 11/15/2017 NGx5 days  Blood Culture x2, 11/17/2017 (repeat) NG x 4 days   Discharge Instructions: Discharge Instructions    Discharge instructions   Complete by:  As directed    It was a pleasure taking care of you.  -Continue to take your antibiotics as prescribed on the bottles through March 1st. You will also need to follow up with the Infectious Disease doctors. -Also take your other medication on a daily basis regularly.  -We also prescribed a cough medicine and the voltaren gel to use for your pain that should help along with the suboxone.  -For the suboxone, we made you an appointment in [facility] for tomorrow 2/5 at 9:45 am. You will be able to get a prescription for a longer supply from the doctor's who specialize in using this medicine to help treat addiction to opiates like heroin. The clinic is on the ground floor of the hospital on the east part (one floor below the emergency department).   Increase activity slowly   Complete by:  As directed        Signed: Ginger Carne, MD 11/22/2017, 3:33 PM   Pager: [phone]

## 2017-11-17 NOTE — Progress Notes (Signed)
   Subjective:  Patient seen laying comfortably in bed this AM in no acute distress. Patient states that pain poorly controlled overnight, continues to be worse with inspiration. Mild relief reported with Toradol, but patient states that pain out of control and not able to eat because of pain. Patient reports episode of emesis overnight 2/2 pain.   Objective:  Vital signs in last 24 hours: Vitals:   11/16/17 1400 11/16/17 1429 11/16/17 2115 11/17/17 0536  BP: 135/77 135/82 (!) 141/92 123/65  Pulse: 85 84 91 87  Resp: 16 (!) 28 18 18   Temp: 98.2 F (36.8 C) 98.9 F (37.2 C) 99.3 F (37.4 C) 98.5 F (36.9 C)  TempSrc: Oral  Oral   SpO2: 100% 100% 99% 98%  Weight:      Height:       Physical Exam  Constitutional: He appears well-developed and well-nourished. No distress.  HENT:  Mouth/Throat: Oropharynx is clear and moist. No oropharyngeal exudate.  Cardiovascular: Normal rate, regular rhythm and intact distal pulses. Exam reveals no friction rub.  No murmur heard. Respiratory:  No accessory muscle use or nasal flaring. Lungs clear to auscultation bilaterally, although patient does not have much effort with exam 2/2 pain.   Musculoskeletal: He exhibits no edema (of bilateral lower extremities) or tenderness (of bilateral lower extremities).  Skin: Skin is warm and dry. No rash noted. He is not diaphoretic. No erythema.  Multiple well healed scars on forearms without areas of erythema, overt abscesses, or tenderness.   Assessment/Plan:  Active Problems:   SIRS (systemic inflammatory response syndrome) (HCC)  Pedro Owens is a 27 yo with PMH of HIV off ART >6  months and current IV drug use who presented with fever and was found to have pulmonary septic emboli on admission consistent with endocarditis. The patient was admitted to the internal medicine teaching service with infectious disease and cardiology consulting. The specific problems addressed during admission are as  follows:  Pulmonary septic emboli consistent with endocarditis: Patient afebrile and hemodynamically stable in past 24 hours. Patient to undergo TEE to rule out infectious endocarditis given evidence of septic emboli and recent, daily IV drug use. Avoiding IV narcotics given history of substance use. Patient's pain difficult to control with Toradol, so will give oral medication PRN.  -Infectious disease consulted, recommendations appreciated -TEE scheduled 2/1 at 11:00 am, patient will need to be NPO @ midnight on 11/18/2017 -Continue vancomycin, ceftriaxone -Blood cultures no growth at 24 hours  -Pain control: Toradol 30 mg q6 hours PRN, Tramadol 50 mg q8 hours PRN for breakthrough pain -Oral ondansetron for nausea, 4 mg q8 hours PRN  HIV: Patient's last CD4 count = 800 and viral load 2,050 in March 2018, but has not taken ART for 6 months. Health department contacted for assistance. RPR non-reactive.  -CD4 count, viral load, & Hepatitis B/C serologies pending  Hx of polysubstance use: UDS + for amphetamines, opiates, cocaine, THC. -Monitor clinically for signs/symptoms of withdrawal -Avoid IV narcotic pain medication -SW for active drug use resources, continue counseling on rounds  FEN/GI: -Regular diet -No IVF  VTE Prophylaxis: Lovenox daily Code Status: Full  Dispo: Anticipated discharge pending workup for endocarditis.  Rozann Lesches, MD 11/17/2017, 7:26 AM Pager: [phone]

## 2017-11-17 NOTE — Progress Notes (Signed)
[facility]    Date of Admission:  11/15/2017     ID: Pedro Owens is a 27 y.o. male with uncontrolled HIV, active IVDU presenting with chest pain found to have septic pulmonary emboli on CT chest.   Antibiotics: 1/28 IV Vanc >>  1/28 IV Zosyn x 1 dose >> dc'd  1/28 IV Ceftriaxone >>    Cultures: 1/28 Blood >> NG 24 hours (not updated today) - not optimal due to inadequate volume of blood 1/30 Blood >>   Subjective: Doing well this morning, resting comfortably.   Medications:  . enoxaparin (LOVENOX) injection  40 mg Subcutaneous Q24H  . lidocaine  1 patch Transdermal Daily  . nicotine  21 mg Transdermal Daily  . pantoprazole  40 mg Oral Daily    Objective: Vital signs in last 24 hours: Temp:  [98.2 F (36.8 C)-99.3 F (37.4 C)] 98.5 F (36.9 C) (01/30 0536) Pulse Rate:  [84-91] 87 (01/30 0536) Resp:  [16-28] 18 (01/30 0536) BP: (123-141)/(65-92) 123/65 (01/30 0536) SpO2:  [98 %-100 %] 98 % (01/30 0536)  Constitutional: NAD, appears comfortable HEENT: Atraumatic, normocephalic. PERRL, anicteric sclera.  Neck: Supple, trachea midline.  Cardiovascular: RRR, no murmurs, rubs, or gallops.  Pulmonary/Chest: CTAB, no wheezes, rales, or rhonchi.  Abdominal: Soft, non tender, non distended. +BS.  Extremities: Warm and well perfused. No edema.  Neurological: A&Ox3, CN II - XII grossly intact.  Skin: No rashes or erythema  Psychiatric: Normal mood and affect  Lab Results Recent Labs    11/16/17 0345 11/17/17 0521  WBC 20.0* 21.6*  HGB 12.7* 13.8  HCT 38.6* 40.8  NA 137 133*  K 3.2* 3.6  CL 107 104  CO2 22 21*  BUN 12 11  CREATININE 0.96 0.85   Liver Panel Recent Labs    11/15/17 0936  PROT 7.9  ALBUMIN 3.5  AST 41  ALT 42  ALKPHOS 68  BILITOT 0.7   Sedimentation Rate No results for input(s): ESRSEDRATE in the last 72 hours. C-Reactive Protein No results for input(s): CRP in the last 72  hours.  Microbiology:  Studies/Results: Ct Angio Chest Pe W/cm &/or Wo Cm  Result Date: 11/15/2017 CLINICAL DATA:  27 y/o M; central chest pain radiating into the left lower abdomen. Nausea and vomiting. History of HIV, smoking, and polysubstance use. EXAM: CT ANGIOGRAPHY CHEST CT ABDOMEN AND PELVIS WITH CONTRAST TECHNIQUE: Multidetector CT imaging of the chest was performed using the standard protocol during bolus administration of intravenous contrast. Multiplanar CT image reconstructions and MIPs were obtained to evaluate the vascular anatomy. Multidetector CT imaging of the abdomen and pelvis was performed using the standard protocol during bolus administration of intravenous contrast. CONTRAST:  ISOVUE-370 IOPAMIDOL (ISOVUE-370) INJECTION 76% COMPARISON:  08/15/2015 CT abdomen and pelvis. FINDINGS: CTA CHEST FINDINGS Cardiovascular: Satisfactory opacification of the pulmonary arteries to the segmental level. No evidence of pulmonary embolism. Normal heart size. No pericardial effusion. Mediastinum/Nodes: No enlarged mediastinal, hilar, or axillary lymph nodes. Thyroid gland, trachea, and esophagus demonstrate no significant findings. Lungs/Pleura: Right upper lobe, left upper lobe lingula, and left lower lobe centrally lucent nodular opacities (series 8, image 70). Small left pleural effusion. Musculoskeletal: No chest wall abnormality. No acute or significant osseous findings. Review of the MIP images confirms the above findings. CT ABDOMEN and PELVIS FINDINGS Hepatobiliary: No focal liver abnormality is seen. No gallstones, gallbladder wall thickening, or biliary dilatation. Pancreas: Unremarkable. No pancreatic ductal dilatation or surrounding inflammatory changes. Spleen:  Normal in size without focal abnormality. Adrenals/Urinary Tract: Adrenal glands are unremarkable. Subcentimeter left kidney interpolar cyst. Kidneys are otherwise normal, without renal calculi, focal lesion, or  hydronephrosis. Bladder is unremarkable. Stomach/Bowel: Stomach is within normal limits. Appendix appears normal. No evidence of bowel wall thickening, distention, or inflammatory changes. Vascular/Lymphatic: No significant vascular findings are present. No enlarged abdominal or pelvic lymph nodes. Reproductive: Prostate is unremarkable. Other: No abdominal wall hernia or abnormality. No abdominopelvic ascites. Musculoskeletal: No acute or significant osseous findings. Review of the MIP images confirms the above findings. IMPRESSION: 1. Multiple centrally lucent pulmonary nodules greatest in the left upper lobe lingula likely representing septic emboli. Evaluation for endocarditis is recommended. 2. No pulmonary embolus identified. 3. Small left pleural effusion. 4. Unremarkable CT of abdomen and pelvis. These results were called by telephone at the time of interpretation on 11/15/2017 at 2:49 pm to Dr. Demetrios Loll , who verbally acknowledged these results. Electronically Signed   By: Mitzi Hansen M.D.   On: 11/15/2017 14:51   Ct Abdomen Pelvis W Contrast  Result Date: 11/15/2017 CLINICAL DATA:  27 y/o M; central chest pain radiating into the left lower abdomen. Nausea and vomiting. History of HIV, smoking, and polysubstance use. EXAM: CT ANGIOGRAPHY CHEST CT ABDOMEN AND PELVIS WITH CONTRAST TECHNIQUE: Multidetector CT imaging of the chest was performed using the standard protocol during bolus administration of intravenous contrast. Multiplanar CT image reconstructions and MIPs were obtained to evaluate the vascular anatomy. Multidetector CT imaging of the abdomen and pelvis was performed using the standard protocol during bolus administration of intravenous contrast. CONTRAST:  ISOVUE-370 IOPAMIDOL (ISOVUE-370) INJECTION 76% COMPARISON:  08/15/2015 CT abdomen and pelvis. FINDINGS: CTA CHEST FINDINGS Cardiovascular: Satisfactory opacification of the pulmonary arteries to the segmental  level. No evidence of pulmonary embolism. Normal heart size. No pericardial effusion. Mediastinum/Nodes: No enlarged mediastinal, hilar, or axillary lymph nodes. Thyroid gland, trachea, and esophagus demonstrate no significant findings. Lungs/Pleura: Right upper lobe, left upper lobe lingula, and left lower lobe centrally lucent nodular opacities (series 8, image 70). Small left pleural effusion. Musculoskeletal: No chest wall abnormality. No acute or significant osseous findings. Review of the MIP images confirms the above findings. CT ABDOMEN and PELVIS FINDINGS Hepatobiliary: No focal liver abnormality is seen. No gallstones, gallbladder wall thickening, or biliary dilatation. Pancreas: Unremarkable. No pancreatic ductal dilatation or surrounding inflammatory changes. Spleen: Normal in size without focal abnormality. Adrenals/Urinary Tract: Adrenal glands are unremarkable. Subcentimeter left kidney interpolar cyst. Kidneys are otherwise normal, without renal calculi, focal lesion, or hydronephrosis. Bladder is unremarkable. Stomach/Bowel: Stomach is within normal limits. Appendix appears normal. No evidence of bowel wall thickening, distention, or inflammatory changes. Vascular/Lymphatic: No significant vascular findings are present. No enlarged abdominal or pelvic lymph nodes. Reproductive: Prostate is unremarkable. Other: No abdominal wall hernia or abnormality. No abdominopelvic ascites. Musculoskeletal: No acute or significant osseous findings. Review of the MIP images confirms the above findings. IMPRESSION: 1. Multiple centrally lucent pulmonary nodules greatest in the left upper lobe lingula likely representing septic emboli. Evaluation for endocarditis is recommended. 2. No pulmonary embolus identified. 3. Small left pleural effusion. 4. Unremarkable CT of abdomen and pelvis. These results were called by telephone at the time of interpretation on 11/15/2017 at 2:49 pm to Dr. Demetrios Loll , who verbally  acknowledged these results. Electronically Signed   By: Mitzi Hansen M.D.   On: 11/15/2017 14:51     Assessment/Plan: Likely endocarditis. Afebrile since admission and hemodynamically stable. Persistent leukocytosis despite IV  abx. Continue IV Vanc and Ceftriaxone. Plan for TEE scheduled Friday morning. Blood cultures repeated today due to inadequate volume received on first culture. Will restart triumeq today.   [facility] Cell: [phone] Pager: [phone]  11/17/2017, 10:54 AM

## 2017-11-18 DIAGNOSIS — F1290 Cannabis use, unspecified, uncomplicated: Secondary | ICD-10-CM

## 2017-11-18 DIAGNOSIS — R0989 Other specified symptoms and signs involving the circulatory and respiratory systems: Secondary | ICD-10-CM

## 2017-11-18 DIAGNOSIS — B1920 Unspecified viral hepatitis C without hepatic coma: Secondary | ICD-10-CM

## 2017-11-18 DIAGNOSIS — I330 Acute and subacute infective endocarditis: Secondary | ICD-10-CM

## 2017-11-18 LAB — BASIC METABOLIC PANEL
ANION GAP: 8 (ref 5–15)
BUN: 10 mg/dL (ref 6–20)
CHLORIDE: 102 mmol/L (ref 101–111)
CO2: 23 mmol/L (ref 22–32)
CREATININE: 0.9 mg/dL (ref 0.61–1.24)
Calcium: 7.9 mg/dL — ABNORMAL LOW (ref 8.9–10.3)
GFR calc non Af Amer: >60 mL/min (ref >60)
Glucose, Bld: 135 mg/dL — ABNORMAL HIGH (ref 65–99)
Potassium: 3.5 mmol/L (ref 3.5–5.1)
SODIUM: 133 mmol/L — AB (ref 135–145)

## 2017-11-18 MED ORDER — TRAZODONE HCL 50 MG PO TABS
50.0000 mg | ORAL_TABLET | Freq: Once | ORAL | Status: AC | PRN
  Administered 2017-11-18: 50 mg via ORAL
  Filled 2017-11-18: qty 1

## 2017-11-18 NOTE — H&P (View-Only) (Signed)
   Subjective:  Patient seen laying comfortably in bed this AM in no acute distress. Patient would not speak to examiners this AM regarding pain or other symptoms.   Objective:  Vital signs in last 24 hours: Vitals:   11/17/17 1355 11/17/17 2231 11/18/17 0454 11/18/17 0642  BP: 136/83 119/60 129/68   Pulse: 89 92 92   Resp: 18 18 (!) 36   Temp: 98.1 F (36.7 C) (!) 100.7 F (38.2 C) (!) 101.6 F (38.7 C) 99.1 F (37.3 C)  TempSrc: Oral Oral Oral   SpO2: 99% 99% 100%   Weight:      Height:       Physical Exam  Constitutional: He appears well-developed and well-nourished. No distress.  Cardiovascular: Normal rate, regular rhythm and intact distal pulses. Exam reveals no friction rub.  No murmur heard. Respiratory:  No accessory muscle use or nasal flaring. Lungs clear to auscultation in anterior lung fields, although patient did not participate with examination.   GI: Soft. Bowel sounds are normal. He exhibits no distension. There is no tenderness.  Musculoskeletal: He exhibits no edema (of bilateral lower extremities) or tenderness (of bilateral lower extremities).  Skin: He is not diaphoretic.  Multiple well healed scars on forearms, unchanged from previous exams.   Assessment/Plan:  Principal Problem:   Acute endocarditis Active Problems:   SIRS (systemic inflammatory response syndrome) (HCC)  Pedro Owens is a 27 yo male who identifies as male with PMH of HIV off ART >6  months and current IV drug use who presented with fever and was found to have pulmonary septic emboli on admission consistent with endocarditis. The patient was admitted to the internal medicine teaching service with infectious disease and cardiology consulting. The specific problems addressed during admission are as follows:  Pulmonary septic emboli consistent with endocarditis: Patient febrile this AM, resolved with Tylenol. Did not repeat blood cultures at that time, as they were repeated earlier in  the day. Patient to undergo TEE to rule out infectious endocarditis tomorrow AM, NPO at midnight pending procedure. -Infectious disease consulted, recommendations appreciated -TEE scheduled 2/1 at 11:00 am -Continue vancomycin, ceftriaxone per pharmacy -1/28 Blood cultures: no growth at 48 hours  -1/30 Blood cultures: pending -Pain control: Toradol 30 mg q6 hours PRN, Tramadol 50 mg q8 hours PRN for breakthrough pain  HIV, Hepatitis C: Current CD4 = 380, RNA pending. Hepatitis C Ab +, RNA pending.  -Continue Triumeq daily -Health department on 11/16/2017 -Other pertinent studies: Hepatitis B antigen/total antibody negative, RPR non-reactive -Will need infectious disease follow up on discharge for HIV, Hep C  Hx of polysubstance use: UDS + for amphetamines, opiates, cocaine, THC. -Monitor clinically for signs/symptoms of withdrawal -Avoid IV narcotic pain medication -SW for active drug use resources  FEN/GI: -Regular diet, NPO at midnight for TEE tomorrow -No IVF  VTE Prophylaxis: Lovenox daily Code Status: Full  Dispo: Anticipated discharge pending workup for endocarditis and IV antibiotic therapy.  Rozann Lesches, MD 11/18/2017, 9:44 AM Pager: [phone]

## 2017-11-18 NOTE — Progress Notes (Signed)
    [facility] has been requested to perform a transesophageal echocardiogram on Pedro Owens for bacteremia.  After careful review of history and examination, the risks and benefits of transesophageal echocardiogram have been explained including risks of esophageal damage, perforation (1:10,000 risk), bleeding, pharyngeal hematoma as well as other potential complications associated with conscious sedation including aspiration, arrhythmia, respiratory failure and death. Alternatives to treatment were discussed, questions were answered. Patient is willing to proceed.   The procedure has been scheduled for tomorrow at 11:00 with Dr. Tenny Craw. Pt will be NPO after midnight. Orders placed.   Berton Bon, NP  11/18/2017 5:07 PM

## 2017-11-18 NOTE — Progress Notes (Signed)
   Subjective:  Patient seen laying comfortably in bed this AM in no acute distress. Patient would not speak to examiners this AM regarding pain or other symptoms.   Objective:  Vital signs in last 24 hours: Vitals:   11/17/17 1355 11/17/17 2231 11/18/17 0454 11/18/17 0642  BP: 136/83 119/60 129/68   Pulse: 89 92 92   Resp: 18 18 (!) 36   Temp: 98.1 F (36.7 C) (!) 100.7 F (38.2 C) (!) 101.6 F (38.7 C) 99.1 F (37.3 C)  TempSrc: Oral Oral Oral   SpO2: 99% 99% 100%   Weight:      Height:       Physical Exam  Constitutional: He appears well-developed and well-nourished. No distress.  Cardiovascular: Normal rate, regular rhythm and intact distal pulses. Exam reveals no friction rub.  No murmur heard. Respiratory:  No accessory muscle use or nasal flaring. Lungs clear to auscultation in anterior lung fields, although patient did not participate with examination.   GI: Soft. Bowel sounds are normal. He exhibits no distension. There is no tenderness.  Musculoskeletal: He exhibits no edema (of bilateral lower extremities) or tenderness (of bilateral lower extremities).  Skin: He is not diaphoretic.  Multiple well healed scars on forearms, unchanged from previous exams.   Assessment/Plan:  Principal Problem:   Acute endocarditis Active Problems:   SIRS (systemic inflammatory response syndrome) (HCC)  Pedro Owens is a 27 yo male who identifies as male with PMH of HIV off ART >6  months and current IV drug use who presented with fever and was found to have pulmonary septic emboli on admission consistent with endocarditis. The patient was admitted to the internal medicine teaching service with infectious disease and cardiology consulting. The specific problems addressed during admission are as follows:  Pulmonary septic emboli consistent with endocarditis: Patient febrile this AM, resolved with Tylenol. Did not repeat blood cultures at that time, as they were repeated earlier in  the day. Patient to undergo TEE to rule out infectious endocarditis tomorrow AM, NPO at midnight pending procedure. -Infectious disease consulted, recommendations appreciated -TEE scheduled 2/1 at 11:00 am -Continue vancomycin, ceftriaxone per pharmacy -1/28 Blood cultures: no growth at 48 hours  -1/30 Blood cultures: pending -Pain control: Toradol 30 mg q6 hours PRN, Tramadol 50 mg q8 hours PRN for breakthrough pain  HIV, Hepatitis C: Current CD4 = 380, RNA pending. Hepatitis C Ab +, RNA pending.  -Continue Triumeq daily -Health department on 11/16/2017 -Other pertinent studies: Hepatitis B antigen/total antibody negative, RPR non-reactive -Will need infectious disease follow up on discharge for HIV, Hep C  Hx of polysubstance use: UDS + for amphetamines, opiates, cocaine, THC. -Monitor clinically for signs/symptoms of withdrawal -Avoid IV narcotic pain medication -SW for active drug use resources  FEN/GI: -Regular diet, NPO at midnight for TEE tomorrow -No IVF  VTE Prophylaxis: Lovenox daily Code Status: Full  Dispo: Anticipated discharge pending workup for endocarditis and IV antibiotic therapy.  Rozann Lesches, MD 11/18/2017, 9:44 AM Pager: [phone]

## 2017-11-18 NOTE — Progress Notes (Signed)
[facility]  Date of Admission:  11/15/2017     Total days of antibiotics 4  Day 4 Ceftriaxone  Day 4 Vancomycin    Patient ID: Pedro Owens is a 27 y.o. transfemale person with untreated HIV (CD4 380, VL pending) here with  Principal Problem:   Acute endocarditis Active Problems:   SIRS (systemic inflammatory response syndrome) (HCC)   . abacavir-dolutegravir-lamiVUDine  1 tablet Oral Daily  . enoxaparin (LOVENOX) injection  40 mg Subcutaneous Q24H  . lidocaine  1 patch Transdermal Daily  . nicotine  21 mg Transdermal Daily  . pantoprazole  40 mg Oral Daily    Interval History: Having increased pain. Reports the Toradol is working very well but only lasts about 2 hours. Tramadol is not working well at all. Tylenol makes her vomit. Would like to get pain under a more reasonable control to a 5 and be able to feel like she can move around a bit.   Allergies  Allergen Reactions  . Acetaminophen Diarrhea    Flares up crohns disease  . Other     IV CONTRAST  . Vicodin [Hydrocodone-Acetaminophen] Nausea And Vomiting  . Dilaudid [Hydromorphone Hcl] Hives and Rash    OBJECTIVE: Vitals:   11/17/17 2231 11/18/17 0454 11/18/17 0642 11/18/17 1405  BP: 119/60 129/68  104/70  Pulse: 92 92  77  Resp: 18 (!) 36  (!) 24  Temp: (!) 100.7 F (38.2 C) (!) 101.6 F (38.7 C) 99.1 F (37.3 C) 98.6 F (37 C)  TempSrc: Oral Oral  Oral  SpO2: 99% 100%  98%  Weight:      Height:       Body mass index is 23.43 kg/m.  Physical Exam  Constitutional: He is oriented to person, place, and time and well-developed, well-nourished, and in no distress.  In bed. Appears uncomfortable.   HENT:  Mouth/Throat: Oropharynx is clear and moist. No oral lesions. Normal dentition. No dental caries.  Eyes: Pupils are equal, round, and reactive to light. No scleral icterus.  Cardiovascular: Normal rate, regular rhythm and normal heart sounds.  No murmur  heard. Pulmonary/Chest: Effort normal and breath sounds normal.  Shallow breaths  Abdominal: Soft. He exhibits no distension. There is no tenderness.  Lymphadenopathy:    He has no cervical adenopathy.  Neurological: He is alert and oriented to person, place, and time.  Skin: Skin is warm and dry. No rash noted.  Psychiatric: Mood and affect normal.  Vitals reviewed.   Lab Results Lab Results  Component Value Date   WBC 21.6 (H) 11/17/2017   HGB 13.8 11/17/2017   HCT 40.8 11/17/2017   MCV 84.5 11/17/2017   PLT 282 11/17/2017    Lab Results  Component Value Date   CREATININE 0.90 11/18/2017   BUN 10 11/18/2017   NA 133 (L) 11/18/2017   K 3.5 11/18/2017   CL 102 11/18/2017   CO2 23 11/18/2017    Lab Results  Component Value Date   ALT 42 11/15/2017   AST 41 11/15/2017   ALKPHOS 68 11/15/2017   BILITOT 0.7 11/15/2017     Microbiology: Recent Results (from the past 240 hour(s))  Blood Culture (routine x 2)     Status: None (Preliminary result)   Collection Time: 11/15/17 12:15 PM  Result Value Ref Range Status   Specimen Description BLOOD RIGHT ANTECUBITAL  Final   Special Requests   Final    BOTTLES  DRAWN AEROBIC AND ANAEROBIC Blood Culture adequate volume   Culture NO GROWTH 3 DAYS  Final   Report Status PENDING  Incomplete  Blood Culture (routine x 2)     Status: None (Preliminary result)   Collection Time: 11/15/17 12:50 PM  Result Value Ref Range Status   Specimen Description BLOOD LEFT HAND  Final   Special Requests   Final    BOTTLES DRAWN AEROBIC AND ANAEROBIC Blood Culture results may not be optimal due to an inadequate volume of blood received in culture bottles   Culture NO GROWTH 3 DAYS  Final   Report Status PENDING  Incomplete  Culture, blood (routine x 2)     Status: None (Preliminary result)   Collection Time: 11/17/17  9:05 AM  Result Value Ref Range Status   Specimen Description BLOOD LEFT ARM  Final   Special Requests IN PEDIATRIC BOTTLE  Blood Culture adequate volume  Final   Culture NO GROWTH 1 DAY  Final   Report Status PENDING  Incomplete  Culture, blood (routine x 2)     Status: None (Preliminary result)   Collection Time: 11/17/17  9:10 AM  Result Value Ref Range Status   Specimen Description BLOOD LEFT ANTECUBITAL  Final   Special Requests IN PEDIATRIC BOTTLE Blood Culture adequate volume  Final   Culture NO GROWTH 1 DAY  Final   Report Status PENDING  Incomplete   Assessment:  HIV  Hep C Ab (+)  IVDU, active  Septic pulmonary emboli likely d/t endocarditis  Fevers   Plan:  1. TEE scheduled tomorrow 2. Requesting increased pain medication - will defer to primary team. High risk for kidney injury with any further increase in Toradol dose and Vancomycin.  3. No changes to antibiotics for now. BCx negative.   Rexene Alberts, MSN, NP-C [facility] Cell: [phone] Pager: [phone]  11/18/2017  3:19 PM

## 2017-11-18 NOTE — Progress Notes (Signed)
Practitioner on-call notified of temp 101.6 and respirations 36. Tylenol and Toradol given for temp and C/O pain. No new orders at this time.

## 2017-11-19 ENCOUNTER — Encounter (HOSPITAL_COMMUNITY)
Admission: EM | Disposition: A | Discharge status: Home / Self Care | Payer: Self-pay | Source: Home / Self Care | Attending: Internal Medicine

## 2017-11-19 ENCOUNTER — Inpatient Hospital Stay (HOSPITAL_COMMUNITY): Payer: BLUE CROSS/BLUE SHIELD | Admitting: Certified Registered Nurse Anesthetist

## 2017-11-19 ENCOUNTER — Inpatient Hospital Stay (HOSPITAL_COMMUNITY): Payer: BLUE CROSS/BLUE SHIELD

## 2017-11-19 ENCOUNTER — Encounter (HOSPITAL_COMMUNITY): Payer: Self-pay | Admitting: *Deleted

## 2017-11-19 DIAGNOSIS — R0781 Pleurodynia: Secondary | ICD-10-CM

## 2017-11-19 DIAGNOSIS — N179 Acute kidney failure, unspecified: Secondary | ICD-10-CM

## 2017-11-19 DIAGNOSIS — B182 Chronic viral hepatitis C: Secondary | ICD-10-CM

## 2017-11-19 DIAGNOSIS — F1910 Other psychoactive substance abuse, uncomplicated: Secondary | ICD-10-CM

## 2017-11-19 DIAGNOSIS — R6510 Systemic inflammatory response syndrome (SIRS) of non-infectious origin without acute organ dysfunction: Secondary | ICD-10-CM

## 2017-11-19 DIAGNOSIS — R768 Other specified abnormal immunological findings in serum: Secondary | ICD-10-CM

## 2017-11-19 DIAGNOSIS — R7881 Bacteremia: Secondary | ICD-10-CM

## 2017-11-19 DIAGNOSIS — I38 Endocarditis, valve unspecified: Secondary | ICD-10-CM

## 2017-11-19 DIAGNOSIS — R1012 Left upper quadrant pain: Secondary | ICD-10-CM

## 2017-11-19 DIAGNOSIS — I2690 Septic pulmonary embolism without acute cor pulmonale: Secondary | ICD-10-CM

## 2017-11-19 DIAGNOSIS — R0682 Tachypnea, not elsewhere classified: Secondary | ICD-10-CM

## 2017-11-19 DIAGNOSIS — I339 Acute and subacute endocarditis, unspecified: Secondary | ICD-10-CM

## 2017-11-19 DIAGNOSIS — F1990 Other psychoactive substance use, unspecified, uncomplicated: Secondary | ICD-10-CM

## 2017-11-19 HISTORY — PX: TEE WITHOUT CARDIOVERSION: SHX5443

## 2017-11-19 LAB — CBC
HEMATOCRIT: 36.6 % — AB (ref 39.0–52.0)
Hemoglobin: 12.4 g/dL — ABNORMAL LOW (ref 13.0–17.0)
MCH: 28.4 pg (ref 26.0–34.0)
MCHC: 33.9 g/dL (ref 30.0–36.0)
MCV: 83.8 fL (ref 78.0–100.0)
Platelets: 333 10*3/uL (ref 150–400)
RBC: 4.37 MIL/uL (ref 4.22–5.81)
RDW: 13.2 % (ref 11.5–15.5)
WBC: 12.1 10*3/uL — ABNORMAL HIGH (ref 4.0–10.5)

## 2017-11-19 LAB — BASIC METABOLIC PANEL
Anion gap: 8 (ref 5–15)
BUN: 11 mg/dL (ref 6–20)
CALCIUM: 7.9 mg/dL — AB (ref 8.9–10.3)
CO2: 25 mmol/L (ref 22–32)
Chloride: 104 mmol/L (ref 101–111)
Creatinine, Ser: 1.28 mg/dL — ABNORMAL HIGH (ref 0.61–1.24)
GFR calc Af Amer: >60 mL/min (ref >60)
GLUCOSE: 107 mg/dL — AB (ref 65–99)
POTASSIUM: 3.7 mmol/L (ref 3.5–5.1)
Sodium: 137 mmol/L (ref 135–145)

## 2017-11-19 LAB — HIV-1 RNA, QUALITATIVE, TMA: HIV-1 RNA, Qualitative, TMA: POSITIVE — AB

## 2017-11-19 LAB — VANCOMYCIN, TROUGH: Vancomycin Tr: 19 ug/mL (ref 15–20)

## 2017-11-19 SURGERY — ECHOCARDIOGRAM, TRANSESOPHAGEAL
Anesthesia: Monitor Anesthesia Care

## 2017-11-19 MED ORDER — KETOROLAC TROMETHAMINE 15 MG/ML IJ SOLN
15.0000 mg | Freq: Once | INTRAMUSCULAR | Status: AC
  Administered 2017-11-19: 15 mg via INTRAVENOUS
  Filled 2017-11-19: qty 1

## 2017-11-19 MED ORDER — LIDOCAINE VISCOUS 2 % MT SOLN
OROMUCOSAL | Status: DC | PRN
  Administered 2017-11-19: 10 mL via OROMUCOSAL

## 2017-11-19 MED ORDER — LIDOCAINE VISCOUS 2 % MT SOLN
OROMUCOSAL | Status: AC
  Filled 2017-11-19: qty 15

## 2017-11-19 MED ORDER — CIPROFLOXACIN HCL 500 MG PO TABS: 500.0000 mg | ORAL_TABLET | Freq: Two times a day (BID) | ORAL | Status: DC

## 2017-11-19 MED ORDER — LINEZOLID 600 MG PO TABS
600.0000 mg | ORAL_TABLET | Freq: Two times a day (BID) | ORAL | Status: DC
  Administered 2017-11-19 – 2017-11-22 (×6): 600 mg via ORAL
  Filled 2017-11-19 (×7): qty 1

## 2017-11-19 MED ORDER — PROPOFOL 10 MG/ML IV BOLUS
INTRAVENOUS | Status: DC | PRN
  Administered 2017-11-19 (×3): 20 mg via INTRAVENOUS
  Administered 2017-11-19: 40 mg via INTRAVENOUS
  Administered 2017-11-19: 20 mg via INTRAVENOUS
  Administered 2017-11-19: 10 mg via INTRAVENOUS
  Administered 2017-11-19: 40 mg via INTRAVENOUS

## 2017-11-19 MED ORDER — LEVOFLOXACIN 750 MG PO TABS
750.0000 mg | ORAL_TABLET | ORAL | Status: DC
  Administered 2017-11-19 – 2017-11-22 (×4): 750 mg via ORAL
  Filled 2017-11-19 (×4): qty 1

## 2017-11-19 MED ORDER — VANCOMYCIN HCL IN DEXTROSE 750-5 MG/150ML-% IV SOLN
750.0000 mg | Freq: Three times a day (TID) | INTRAVENOUS | Status: DC
  Filled 2017-11-19: qty 150

## 2017-11-19 MED ORDER — SODIUM CHLORIDE 0.9 % IV SOLN
INTRAVENOUS | Status: DC
  Administered 2017-11-19: 11:00:00 via INTRAVENOUS

## 2017-11-19 MED ORDER — BUPRENORPHINE HCL-NALOXONE HCL 8-2 MG SL SUBL
1.0000 | SUBLINGUAL_TABLET | Freq: Every day | SUBLINGUAL | Status: DC
  Administered 2017-11-19 – 2017-11-20 (×2): 1 via SUBLINGUAL
  Filled 2017-11-19 (×2): qty 1

## 2017-11-19 MED ORDER — SODIUM CHLORIDE 0.9 % IV SOLN: INTRAVENOUS | Status: DC

## 2017-11-19 MED ORDER — RAMELTEON 8 MG PO TABS
8.0000 mg | ORAL_TABLET | Freq: Once | ORAL | Status: AC | PRN
  Administered 2017-11-19: 8 mg via ORAL
  Filled 2017-11-19: qty 1

## 2017-11-19 NOTE — Anesthesia Preprocedure Evaluation (Addendum)
Anesthesia Evaluation  Patient identified by MRN, date of birth, ID band Patient awake    Reviewed: Allergy & Precautions, H&P , Patient's Chart, lab work & pertinent test results, reviewed documented beta blocker date and time   Airway Mallampati: II  TM Distance: >3 FB Neck ROM: Full    Dental no notable dental hx.    Pulmonary Current Smoker,    Pulmonary exam normal breath sounds clear to auscultation       Cardiovascular hypertension, Normal cardiovascular exam Rhythm:Regular Rate:Normal     Neuro/Psych  Headaches, Seizures -,  Depression    GI/Hepatic (+)     substance abuse  marijuana use and IV drug use,   Endo/Other    Renal/GU      Musculoskeletal   Abdominal   Peds  (+) ADHD Hematology  (+) anemia , HIV,   Anesthesia Other Findings   Reproductive/Obstetrics                             Anesthesia Physical  Anesthesia Plan  ASA: II  Anesthesia Plan: MAC   Post-op Pain Management:    Induction: Intravenous  PONV Risk Score and Plan: Treatment may vary due to age or medical condition  Airway Management Planned: Mask and Natural Airway  Additional Equipment:   Intra-op Plan:   Post-operative Plan: Extubation in OR  Informed Consent: I have reviewed the patients History and Physical, chart, labs and discussed the procedure including the risks, benefits and alternatives for the proposed anesthesia with the patient or authorized representative who has indicated his/her understanding and acceptance.   Dental advisory given  Plan Discussed with: CRNA  Anesthesia Plan Comments:        Anesthesia Quick Evaluation                                   Anesthesia Evaluation  Patient identified by MRN, date of birth, ID band Patient awake    Reviewed: Allergy & Precautions, NPO status , Patient's Chart, lab work & pertinent test results  Airway Mallampati:  II  TM Distance: >3 FB Neck ROM: Full    Dental  (+) Teeth Intact, Dental Advisory Given   Pulmonary neg pulmonary ROS, Current Smoker,    Pulmonary exam normal breath sounds clear to auscultation       Cardiovascular Exercise Tolerance: Good negative cardio ROS Normal cardiovascular exam Rhythm:Regular Rate:Normal     Neuro/Psych Seizures -,  PSYCHIATRIC DISORDERS Anxiety Depression    GI/Hepatic negative GI ROS, (+)     substance abuse (last cocaine use >2.34month ago)  cocaine use and marijuana use, Ileitis   Endo/Other  negative endocrine ROS  Renal/GU negative Renal ROS     Musculoskeletal negative musculoskeletal ROS (+)   Abdominal   Peds  (+) ADHD Hematology  (+) Blood dyscrasia, anemia , HIV,   Anesthesia Other Findings Day of surgery medications reviewed with the patient.  Reproductive/Obstetrics                            Anesthesia Physical Anesthesia Plan  ASA: III  Anesthesia Plan: General   Post-op Pain Management:    Induction: Intravenous  Airway Management Planned: LMA  Additional Equipment:   Intra-op Plan:   Post-operative Plan: Extubation in OR  Informed Consent: I have reviewed the patients History and  Physical, chart, labs and discussed the procedure including the risks, benefits and alternatives for the proposed anesthesia with the patient or authorized representative who has indicated his/her understanding and acceptance.   Dental advisory given  Plan Discussed with: CRNA  Anesthesia Plan Comments: (Risks/benefits of general anesthesia discussed with patient including risk of damage to teeth, lips, gum, and tongue, nausea/vomiting, allergic reactions to medications, and the possibility of heart attack, stroke and death.  All patient questions answered.  Patient wishes to proceed.)        Anesthesia Quick Evaluation

## 2017-11-19 NOTE — Progress Notes (Signed)
Pharmacy Antibiotic Note  Pedro Owens is a 27 y.o. male admitted on 11/15/2017 with endocarditis.   Continues on Vancomycin and Ceftriaxone - day #5.  TEE without vegetation. No growth on cultures. Trough is 19 and therapeutic however concerned she is accumulating with bump in SCr.  Plan: Decrease vancomycin to 750 mg IV q8h Continue ceftriaxone 2 g IV q24h Check trough at least weekly and sooner if renal function changes Monitor renal function F/U plan for LOT  Height: 5\' 11"  (180.3 cm) Weight: 168 lb (76.2 kg) IBW/kg (Calculated) : 75.3  Temp (24hrs), Avg:99.7 F (37.6 C), Min:98.5 F (36.9 C), Max:102.9 F (39.4 C)  Recent Labs  Lab 11/15/17 0936 11/15/17 1014 11/15/17 1217 11/15/17 1414 11/16/17 0345 11/17/17 0521 11/17/17 1244 11/18/17 0529 11/19/17 0316 11/19/17 1321  WBC 21.2*  --   --   --  20.0* 21.6*  --   --  12.1*  --   CREATININE 1.24  --   --   --  0.96 0.85  --  0.90 1.28*  --   LATICACIDVEN  --  2.36* 3.13* 0.99  --   --   --   --   --   --   VANCOTROUGH  --   --   --   --   --   --  7*  --   --  19    Estimated Creatinine Clearance: 94 mL/min (A) (by C-G formula based on SCr of 1.28 mg/dL (H)).    Allergies  Allergen Reactions  . Acetaminophen Diarrhea    Flares up crohns disease  . Other     IV CONTRAST  . Vicodin [Hydrocodone-Acetaminophen] Nausea And Vomiting  . Dilaudid [Hydromorphone Hcl] Hives and Rash    Loura Back, PharmD, BCPS Clinical Pharmacist Phone for today [phone] Main pharmacy - [phone] 11/19/2017 2:47 PM

## 2017-11-19 NOTE — Transfer of Care (Signed)
Immediate Anesthesia Transfer of Care Note  Patient: Pedro Owens  Procedure(s) Performed: TRANSESOPHAGEAL ECHOCARDIOGRAM (TEE) (N/A )  Patient Location: Endoscopy Unit  Anesthesia Type:MAC  Level of Consciousness: awake, alert , oriented and patient cooperative  Airway & Oxygen Therapy: Patient Spontanous Breathing and Patient connected to nasal cannula oxygen  Post-op Assessment: Report given to RN, Post -op Vital signs reviewed and stable and Patient moving all extremities X 4  Post vital signs: Reviewed and stable  Last Vitals:  Vitals:   11/19/17 0558 11/19/17 1032  BP: 113/62 118/62  Pulse: 80 78  Resp: 18 (!) 24  Temp: 37.1 C 37.3 C  SpO2: 99% 100%    Last Pain:  Vitals:   11/19/17 1032  TempSrc: Oral  PainSc: 8       Patients Stated Pain Goal: 4 (15/40/08 6761)  Complications: No apparent anesthesia complications

## 2017-11-19 NOTE — Anesthesia Procedure Notes (Signed)
Procedure Name: MAC Date/Time: 11/19/2017 11:29 AM Performed by: Renato Shin, CRNA Pre-anesthesia Checklist: Patient identified, Emergency Drugs available, Suction available, Patient being monitored and Timeout performed Patient Re-evaluated:Patient Re-evaluated prior to induction Oxygen Delivery Method: Nasal cannula Preoxygenation: Pre-oxygenation with 100% oxygen Induction Type: IV induction Placement Confirmation: positive ETCO2,  CO2 detector and breath sounds checked- equal and bilateral Dental Injury: Teeth and Oropharynx as per pre-operative assessment  Comments: Pt  Maintain natural airway throughout

## 2017-11-19 NOTE — CV Procedure (Signed)
Patient anesthetized with propofol by anesthesia  LA, LAA without masses MV normal  No MR TV normal  Trace TR PV normal  No PI AV normal  No AI No vegetations seen Tiny PFO present as tested by injection of agitated saline  Intrapulmonary shunting seen LImited images of aorta are normal  LV systolic function may be mildly depressed Consider TTE to define  Small pericardial effusion.   FUll report to follow

## 2017-11-19 NOTE — Progress Notes (Signed)
Tech offered Pt a bath. Pt stated no. Tech provided all bath supplies to Pt.

## 2017-11-19 NOTE — Progress Notes (Signed)
Medicine attending: I personally examined this patient today together with resident physician Dr. Tawny Asal and I concur with his evaluation and management plan which we discussed together. 26 year old who identifies gender as male.  HIV disease, polysubstance abuse including injectable narcotics, heroin, amphetamines, cocaine, marijuana.  Hepatitis C positive discovered this admission.  Hepatitis B-.  RPR not reactive.  Admitted on January 28 for evaluation of atypical chest pain with a pleuritic component, nausea, and vomiting.  She was febrile, mildly tachycardic, met additional criteria for systemic inflammation with Lorentz count 21,000, lactic acid 2.36, repeat 3.13.  CT angiogram to rule out pulmonary embolism showed multiple nodules in the left upper lobe and lingular felt to be consistent with septic emboli.  No cardiac murmur on exam but high suspicion for endocarditis.  She was started on empiric therapy with vancomycin and ceftriaxone.  She continues to have intermittent high fevers.  Persistent atypical left upper quadrant pain. Barbone blood count has fallen to 12,000.  Blood cultures negative so far. On exam today, no splinter hemorrhages, no subconjunctival hemorrhage, no Osler nodes, no cardiac murmur.  Tender left upper quadrant.  No focal neurologic deficits. Impression: Likely endocarditis with septic pulmonary emboli. I suspect his left upper quadrant pain may be related to splenic infarcts also related to endocarditis although no obvious abnormality seen in the spleen on January 28 CT scan. He will get a TEE today. Due to rise in his creatinine we will stop Toradol.  Consider alternative drug to vancomycin.

## 2017-11-19 NOTE — Progress Notes (Signed)
[facility]  Date of Admission:  11/15/2017     Total days of antibiotics 5  Day 5 Ceftriaxone  Day 5 Vancomycin    Patient ID: Pedro Owens is a 27 y.o. gender fluid (no strict gender preference) person with untreated HIV (CD4 380, VL pending) here with  Principal Problem:   Endocarditis, suspected Active Problems:   Septic pulmonary embolism (HCC)   Polysubstance abuse (HCC)   SIRS (systemic inflammatory response syndrome) (HCC)   Hepatitis C antibody test positive   . abacavir-dolutegravir-lamiVUDine  1 tablet Oral Daily  . buprenorphine-naloxone  1 tablet Sublingual Daily  . enoxaparin (LOVENOX) injection  40 mg Subcutaneous Q24H  . lidocaine  1 patch Transdermal Daily  . nicotine  21 mg Transdermal Daily  . pantoprazole  40 mg Oral Daily    Interval History: TEE done today. Ediberto's pain is under much better control with suboxone today.   Allergies  Allergen Reactions  . Acetaminophen Diarrhea    Flares up crohns disease  . Other     IV CONTRAST  . Vicodin [Hydrocodone-Acetaminophen] Nausea And Vomiting  . Dilaudid [Hydromorphone Hcl] Hives and Rash    OBJECTIVE: Vitals:   11/19/17 1032 11/19/17 1146 11/19/17 1155 11/19/17 1227  BP: 118/62 (!) 130/56 139/73 118/65  Pulse: 78 (!) 105 95 95  Resp: (!) 24 (!) 38 (!) 32 (!) 28  Temp: 99.2 F (37.3 C) 99 F (37.2 C)  100 F (37.8 C)  TempSrc: Oral Oral  Oral  SpO2: 100% 96% 98% 93%  Weight:      Height:       Body mass index is 23.43 kg/m.  Physical Exam  Constitutional: He is oriented to person, place, and time and well-developed, well-nourished, and in no distress.  In bed. Appears uncomfortable.   HENT:  Mouth/Throat: Oropharynx is clear and moist. No oral lesions. Normal dentition. No dental caries.  Eyes: Pupils are equal, round, and reactive to light. No scleral icterus.  Cardiovascular: Normal rate, regular rhythm and normal heart sounds.  No murmur  heard. Pulmonary/Chest: Effort normal and breath sounds normal.  Shallow breaths  Abdominal: Soft. He exhibits no distension. There is no tenderness.  Lymphadenopathy:    He has no cervical adenopathy.  Neurological: He is alert and oriented to person, place, and time.  Skin: Skin is warm and dry. No rash noted.  Psychiatric: Mood and affect normal.  Vitals reviewed.   Lab Results Lab Results  Component Value Date   WBC 12.1 (H) 11/19/2017   HGB 12.4 (L) 11/19/2017   HCT 36.6 (L) 11/19/2017   MCV 83.8 11/19/2017   PLT 333 11/19/2017    Lab Results  Component Value Date   CREATININE 1.28 (H) 11/19/2017   BUN 11 11/19/2017   NA 137 11/19/2017   K 3.7 11/19/2017   CL 104 11/19/2017   CO2 25 11/19/2017    Lab Results  Component Value Date   ALT 42 11/15/2017   AST 41 11/15/2017   ALKPHOS 68 11/15/2017   BILITOT 0.7 11/15/2017     Microbiology: Recent Results (from the past 240 hour(s))  Blood Culture (routine x 2)     Status: None (Preliminary result)   Collection Time: 11/15/17 12:15 PM  Result Value Ref Range Status   Specimen Description BLOOD RIGHT ANTECUBITAL  Final   Special Requests   Final    BOTTLES DRAWN AEROBIC AND ANAEROBIC Blood Culture adequate  volume   Culture   Final    NO GROWTH 4 DAYS Performed at [facility], [address] [place], [place] [address]    Report Status PENDING  Incomplete  Blood Culture (routine x 2)     Status: None (Preliminary result)   Collection Time: 11/15/17 12:50 PM  Result Value Ref Range Status   Specimen Description BLOOD LEFT HAND  Final   Special Requests   Final    BOTTLES DRAWN AEROBIC AND ANAEROBIC Blood Culture results may not be optimal due to an inadequate volume of blood received in culture bottles   Culture   Final    NO GROWTH 4 DAYS Performed at [facility], [address] [place], [place] [address]    Report Status PENDING  Incomplete  Culture, blood (routine x 2)     Status: None  (Preliminary result)   Collection Time: 11/17/17  9:05 AM  Result Value Ref Range Status   Specimen Description BLOOD LEFT ARM  Final   Special Requests IN PEDIATRIC BOTTLE Blood Culture adequate volume  Final   Culture   Final    NO GROWTH 2 DAYS Performed at [facility], [address] [place], [place] [address]    Report Status PENDING  Incomplete  Culture, blood (routine x 2)     Status: None (Preliminary result)   Collection Time: 11/17/17  9:10 AM  Result Value Ref Range Status   Specimen Description BLOOD LEFT ANTECUBITAL  Final   Special Requests IN PEDIATRIC BOTTLE Blood Culture adequate volume  Final   Culture   Final    NO GROWTH 2 DAYS Performed at [facility], [address], [place], [place] [address]    Report Status PENDING  Incomplete   Assessment:  HIV (CD4 380 11/16/17)  Hep C Ab (+)   RNA quant  pending  IVDU, active  Septic pulmonary emboli likely d/t endocarditis   TEE without any evidence of vegetations; small pericardial effusion  Fevers - resolved    Plan:  1. Considering Jamyson has 'constellation' of symptoms suggesting infective endocarditis will treat with dual oral therapy. Not ideal however recently using IVD and unsafe for PICC line. He is concerned with getting back to work and unable/willing to stay here for duration of IV therapy.  2. No organism ID with lack of positive blood cultures so will start linezolid 600 mg BID + ciprofloxacin for 4 weeks through March 1st 2019. Based on recent AKI would do Cipro 500 mg BID and increase to 750 mg BID once we are sure creatinine normalizes off vancomycin / ketorolac.  3. She will have an appointment at [facility] with me February 14th @ 11:15 to ensure he is doing well and recheck labs on therapy then will get him back in with Dr. Orvan Falconer.  4. Would prefer to have meds sent to [facility] which he is OK with 5. Pain under better control today - appreciate primary team stopping other  nephrotoxic agents in setting of Vancomycin.  6. Hep C viral load still pending - needs follow up with that. Genotype in AM with next lab draw.  7. Will need outpatient treatment with internal medicine suboxone clinic. I have sent an Epic message to set up an appointment.   Rexene Alberts, MSN, NP-C [facility] Cell: [phone] Pager: [phone]  11/19/2017  3:54 PM

## 2017-11-19 NOTE — Anesthesia Postprocedure Evaluation (Signed)
Anesthesia Post Note  Patient: ORLA JOLLIFF  Procedure(s) Performed: TRANSESOPHAGEAL ECHOCARDIOGRAM (TEE) (N/A )     Patient location during evaluation: PACU Anesthesia Type: MAC Level of consciousness: awake and alert Pain management: pain level controlled Vital Signs Assessment: post-procedure vital signs reviewed and stable Respiratory status: spontaneous breathing, nonlabored ventilation, respiratory function stable and patient connected to nasal cannula oxygen Cardiovascular status: stable and blood pressure returned to baseline Postop Assessment: no apparent nausea or vomiting Anesthetic complications: no    Last Vitals:  Vitals:   11/19/17 1155 11/19/17 1227  BP: 139/73 118/65  Pulse: 95 95  Resp: (!) 32 (!) 28  Temp:  37.8 C  SpO2: 98% 93%    Last Pain:  Vitals:   11/19/17 1227  TempSrc: Oral  PainSc:                  Riccardo Dubin

## 2017-11-19 NOTE — Progress Notes (Addendum)
Returned to pt's room following rounds and procedure to further discuss substance abuse history. She reports her drug use started 4-5 years ago with inhaling heroin. She continued this method for about 1 year before transitioning to IV use of heroin which she continued consistently. She reports using every day for the majority of the day despite having a desire to decrease her use in the past and currently. When going periods without heroin, she experienced cravings, withdrawal sx, and feels she placed herself in dangerous situations in order to obtain or use drugs. She went to rehab in June 2018 where she used subutex. She reports she used heroin once while in rehab, but upon discharge stayed clean for about one month before resuming drug use. Over the last several months since leaving rehab and resuming her drug use, she started using "molly" intravenously and over the last several weeks also crack cocaine intravenously.   She again expresses that she has a desire to stop using these drugs, especially given this hospitalization as a result. However, her focus is more on controlling her acute pain due to its severity. We will start suboxone now in an attempt to better control her pain. Toradol was providing intermittent relief but has been held due to concerns for aki. If she has recurrent pain between this afternoon suboxone dose, and tomorrow's dose (will increase to BID tomorrow am), a one time dose of toradol can be considered as she has refused tylenol due to its lack of efficacy. Alternatively, if this request is made in the early morning hours, repeat dose of suboxone or half dose can also be considered.

## 2017-11-19 NOTE — Interval H&P Note (Signed)
History and Physical Interval Note:  11/19/2017 9:58 AM  Pedro Owens  has presented today for surgery, with the diagnosis of BACTEREMIA  The various methods of treatment have been discussed with the patient and family. After consideration of risks, benefits and other options for treatment, the patient has consented to  Procedure(s): TRANSESOPHAGEAL ECHOCARDIOGRAM (TEE) (N/A) as a surgical intervention .  The patient's history has been reviewed, patient examined, no change in status, stable for surgery.  I have reviewed the patient's chart and labs.  Questions were answered to the patient's satisfaction.     Dietrich Pates

## 2017-11-19 NOTE — Progress Notes (Signed)
Subjective:  Febrile overnight to 102.9 overnight. She was uncomfortable this morning and frustrated with her L side pain as she has not had adequate relief from her pain regimen and feels any changes, information, or responses have been at a slow pace overall. Her pain worsens with movement. Pt was NPO at midnight   Objective:  Vital signs in last 24 hours: Vitals:   11/18/17 2335 11/19/17 0036 11/19/17 0558 11/19/17 1032  BP:   113/62 118/62  Pulse:   80 78  Resp:   18 (!) 24  Temp: 99.5 F (37.5 C) 98.5 F (36.9 C) 98.8 F (37.1 C) 99.2 F (37.3 C)  TempSrc: Oral Oral  Oral  SpO2:   99% 100%  Weight:      Height:       General: Resting in bed uncomfortable and attempting to lie still, no acute distress HEENT: PERRL, EOMI CV: RRR with no murmur appreciated. TTP of L lateral chest wall  Resp: Shallow, tachypneic respirations due to pain, good saturation  Abd: Soft, +BS, TTP of LUQ near costal margin, no palpable splenomegaly  Extr: No edema, good strength and reflexes intact  Neuro: Alert and oriented x3 Skin: Warm, dry. No splinter hemorrhages or other changes to digits   Assessment/Plan:  Pedro Owens is a 27 yo male who identifies as male with PMH of HIV off ART >6  months and current IV drug use who presented with fever and was found to have pulmonary septic emboli on admission consistent with endocarditis. The patient was admitted to the internal medicine teaching service with infectious disease and cardiology consulting. The specific problems addressed during admission are as follows:  Pulmonary septic emboli consistent with endocarditis: Patient febrile overnight. Blood cultures continue to have no growth. TEE scheduled for this morning to evaluate for vegetations as this may be culture negative endocarditis. Pain to L side related to septic emboli has been difficult to control, could also be related to splenic insult given its location.  --Infectious disease  consulted, recommendations appreciated --Monitor vital signs, fever curve  --F/u TEE results  --Continue vancomycin, ceftriaxone per pharmacy --1/28 Blood cultures: no growth x 3 days   --1/30 Blood cultures: no growth x 24 hours  --CBC --Pain control: DC Toradol, Will assess if suboxone is appropriate for both analgesia and substance abuse   Acute Kidney Injury Pt has had Cr wnl during admission with recent increase to 1.28 (from 0.90), likely due to a combination of Vancomycin and Toradol administration. Will dc toradol and alter pain regimen and continue to monitor closely.  --BMP, avoid further nephrotoxins   HIV, Hepatitis C: Current CD4 = 380, Hepatitis C Ab +, RNA pending. Hep B negative and RPR non-reactive. Health department contacted on 1/29 as pt is with-holding status from partner.   --Continue Triumeq daily --Will need infectious disease follow up on discharge for HIV, Hep C  Hx of polysubstance use  UDS + for amphetamines, opiates, cocaine, THC. She has been using various drugs via IV injection. Will attempt to further clarify her substance use, desire for cessation, and if she would be appropriate for suboxone tx for opioid use disorder.  --Monitor clinically for signs/symptoms of withdrawal --Avoid IV narcotic pain medication --SW for active drug use resources  FEN/GI: --Regular diet following procedure  --No IVF  VTE Prophylaxis: Lovenox daily Code Status: Full  Dispo: Anticipated discharge pending workup for endocarditis and IV antibiotic therapy.  Ginger Carne, MD 11/19/2017, 11:13 AM Pager: [phone]

## 2017-11-19 NOTE — Progress Notes (Signed)
  Echocardiogram Echocardiogram Transesophageal has been performed.  Delcie Roch 11/19/2017, 12:00 PM

## 2017-11-20 LAB — HCV RNA QUANT
HCV Quantitative Log: 6.436 log10 IU/mL (ref >1.70)
HCV Quantitative: 2730000 IU/mL (ref >50)

## 2017-11-20 LAB — CULTURE, BLOOD (ROUTINE X 2)
CULTURE: NO GROWTH
Culture: NO GROWTH
Special Requests: ADEQUATE

## 2017-11-20 LAB — BASIC METABOLIC PANEL
Anion gap: 11 (ref 5–15)
BUN: 10 mg/dL (ref 6–20)
CO2: 25 mmol/L (ref 22–32)
CREATININE: 1.35 mg/dL — AB (ref 0.61–1.24)
Calcium: 8 mg/dL — ABNORMAL LOW (ref 8.9–10.3)
Chloride: 100 mmol/L — ABNORMAL LOW (ref 101–111)
GFR calc Af Amer: >60 mL/min (ref >60)
GFR calc non Af Amer: >60 mL/min (ref >60)
GLUCOSE: 126 mg/dL — AB (ref 65–99)
Potassium: 3 mmol/L — ABNORMAL LOW (ref 3.5–5.1)
SODIUM: 136 mmol/L (ref 135–145)

## 2017-11-20 MED ORDER — DICLOFENAC SODIUM 1 % TD GEL
2.0000 g | Freq: Four times a day (QID) | TRANSDERMAL | Status: DC | PRN
  Administered 2017-11-20 – 2017-11-22 (×2): 2 g via TOPICAL
  Filled 2017-11-20: qty 100

## 2017-11-20 MED ORDER — BUPRENORPHINE HCL-NALOXONE HCL 8-2 MG SL SUBL
1.0000 | SUBLINGUAL_TABLET | Freq: Two times a day (BID) | SUBLINGUAL | Status: DC
  Administered 2017-11-20 – 2017-11-22 (×5): 1 via SUBLINGUAL
  Filled 2017-11-20 (×5): qty 1

## 2017-11-20 MED ORDER — POTASSIUM CHLORIDE CRYS ER 20 MEQ PO TBCR
40.0000 meq | EXTENDED_RELEASE_TABLET | Freq: Two times a day (BID) | ORAL | Status: AC
  Administered 2017-11-20 (×2): 40 meq via ORAL
  Filled 2017-11-20 (×2): qty 2

## 2017-11-20 NOTE — Progress Notes (Signed)
Patient had 6 beat run of V tach. Patient said he felt his heart "jump". Will get a set of vitals. Paged Dr. As well.

## 2017-11-20 NOTE — Progress Notes (Signed)
Pt. wanting pain med other than Tylenol; Dr. Crista Elliot notified.

## 2017-11-20 NOTE — Plan of Care (Signed)
  Education: Knowledge of General Education information will improve 11/20/2017 0349 - Progressing by Olena Mater, RN Note POC reviewed with pt./ significant other.

## 2017-11-20 NOTE — Progress Notes (Signed)
Pt. states that he was told that Trazadone would be reordered again tonight; Dr. Crista Elliot paged.

## 2017-11-20 NOTE — Progress Notes (Signed)
Subjective:  She reports improved pain control overnight with Suboxone with one flare of increased pain overnight.  Max temperature 100.4.  She again expresses a desire to stop her drug use with more long-term Suboxone. K low today, will replete   Objective:  Vital signs in last 24 hours: Vitals:   11/19/17 1155 11/19/17 1227 11/19/17 2137 11/20/17 0530  BP: 139/73 118/65 125/81 (!) 162/76  Pulse: 95 95 88 (!) 106  Resp: (!) 32 (!) 28 (!) 22 20  Temp:  100 F (37.8 C) 99 F (37.2 C) (!) 100.4 F (38 C)  TempSrc:  Oral Oral Oral  SpO2: 98% 93% 95% 94%  Weight:      Height:       General: Sitting up in bed, much more comfortable, no acute distress HEENT: normal conjunctiva CV: RRR with no murmur appreciated.  Interval improvement in the lateral chest wall tenderness Resp: Shallow but improved respirations good saturation  Extr: No edema Neuro: Alert and oriented x3 Skin: Warm, dry   Assessment/Plan:  Pedro Owens is a 27 yo male who identifies as male with PMH of HIV off ART >6  months and current IV drug use who presented with fever and was found to have pulmonary septic emboli on admission consistent with endocarditis. The patient was admitted to the internal medicine teaching service with infectious disease and cardiology consulting. The specific problems addressed during admission are as follows:  Pulmonary septic emboli consistent with endocarditis: Improved fever curve with max of 100.4 overnight. Blood cultures continue to have no growth. TEE was negative for vegetations though this may be due to migration to being septic emboli. ID recommends oral treatment for culture negative endocarditis with PO Linezolid and levofloxacin after inpatient stay until Monday to increase likelihood of successful treatment for both endocarditis and HIV. Duration of therapy until 3/1. Pain to L side related to septic emboli has been difficult to control, but improved with initiation of  suboxone --Infectious disease following, recommendations appreciated --Monitor vital signs, fever curve  --Linezolid 600 mg BID, Levofloxacin 750 mg daily (end date 3/1) --1/28 Blood cultures: no growth x 4 days   --1/30 Blood cultures: no growth x 48 hours  --Pain control: Suboxone 8-2 mg BID    Acute Kidney Injury Pt has had Cr wnl during admission with recent increase to 1.28 (from 0.90), likely due to a combination of Vancomycin and Toradol administration. Today, Cr fairly stable at 1.35, will continue to monitor closely, will hopefully improve with cessation of Vanc and toradol. --BMP, avoid further nephrotoxins    HIV, Hepatitis C: Current CD4 = 380, viral load penidng, Hepatitis C Ab +, RNA and genotype pending. Hep B negative and RPR non-reactive. Health department contacted on 1/29 as pt is with-holding status from partner.   --Continue Triumeq daily --Will need infectious disease follow up on discharge for HIV, Hep C  Hx of polysubstance use  UDS + for amphetamines, opiates, cocaine, THC. She has been using various drugs via IV injection. See separate progress note from 2/1 for further details regarding use history. Will consult OUD physician Monday to assess for appropriateness for OUD clinic follow up and suboxone maintenance therapy  --Monitor clinically for signs/symptoms of withdrawal --Cotn Suboxone 8-2 mg BID  --SW for active drug use resources  FEN/GI: --Regular diet  --No IVF  VTE Prophylaxis: Lovenox daily Code Status: Full  Dispo: Anticipated discharge pending workup for endocarditis and IV antibiotic therapy.  Pedro Carne, MD 11/20/2017, 7:00  AM Pager: [phone]

## 2017-11-21 ENCOUNTER — Encounter (HOSPITAL_COMMUNITY): Payer: Self-pay | Admitting: Internal Medicine

## 2017-11-21 DIAGNOSIS — R51 Headache: Secondary | ICD-10-CM

## 2017-11-21 LAB — CBC
HEMATOCRIT: 37.3 % — AB (ref 39.0–52.0)
HEMOGLOBIN: 12.3 g/dL — AB (ref 13.0–17.0)
MCH: 28 pg (ref 26.0–34.0)
MCHC: 33 g/dL (ref 30.0–36.0)
MCV: 85 fL (ref 78.0–100.0)
Platelets: 416 10*3/uL — ABNORMAL HIGH (ref 150–400)
RBC: 4.39 MIL/uL (ref 4.22–5.81)
RDW: 13.4 % (ref 11.5–15.5)
WBC: 13.1 10*3/uL — ABNORMAL HIGH (ref 4.0–10.5)

## 2017-11-21 LAB — BASIC METABOLIC PANEL
ANION GAP: 9 (ref 5–15)
BUN: 8 mg/dL (ref 6–20)
CHLORIDE: 101 mmol/L (ref 101–111)
CO2: 25 mmol/L (ref 22–32)
Calcium: 8.3 mg/dL — ABNORMAL LOW (ref 8.9–10.3)
Creatinine, Ser: 1.3 mg/dL — ABNORMAL HIGH (ref 0.61–1.24)
GFR calc Af Amer: >60 mL/min (ref >60)
GLUCOSE: 126 mg/dL — AB (ref 65–99)
POTASSIUM: 4.4 mmol/L (ref 3.5–5.1)
Sodium: 135 mmol/L (ref 135–145)

## 2017-11-21 LAB — HIV-1 RNA QUANT-NO REFLEX-BLD
HIV 1 RNA Quant: 1240 copies/mL
LOG10 HIV-1 RNA: 3.093 {Log_copies}/mL

## 2017-11-21 MED ORDER — TRAZODONE HCL 50 MG PO TABS
50.0000 mg | ORAL_TABLET | Freq: Every evening | ORAL | Status: DC | PRN
  Administered 2017-11-21: 50 mg via ORAL
  Filled 2017-11-21: qty 1

## 2017-11-21 NOTE — Progress Notes (Signed)
   Subjective: Doing well this morning, complaining of HA that he feels is from coughing. Otherwise no complaints.   Objective:  Vital signs in last 24 hours: Vitals:   11/20/17 1048 11/20/17 1330 11/20/17 2243 11/21/17 0516  BP: 108/61 122/72 114/78 129/81  Pulse: 87 84 (!) 106 (!) 103  Resp: 14 14 18 18   Temp: 97.8 F (36.6 C) 98.4 F (36.9 C) 100.3 F (37.9 C) 99.4 F (37.4 C)  TempSrc: Oral Oral Oral   SpO2: 90% 95% (!) 89% 93%  Weight:      Height:        General: Sitting up in bed, much more comfortable, no acute distress HEENT: normal conjunctiva CV: RRR with no murmur appreciated.  Interval improvement in the lateral chest wall tenderness Resp: Decreased breath sounds on the left, no wheezing  Extr: No edema Neuro: Alert and oriented x3 Skin: Warm, dry  Assessment/Plan:  Pedro Owens is a 27 yo fluid gender individual with PMH of HIV off ART >6  months and current IV drug use who presented with fever and was found to have pulmonary septic emboli on admission consistent with endocarditis. The patient was admitted to the internal medicine teaching service with infectious disease and cardiology consulting. The specific problems addressed during admission are as follows:  Pulmonary septic emboli consistent with endocarditis: Improved fever curve with max of 100.3 overnight. Blood cultures continue to have no growth. TEE was negative for vegetations though this may be due to migration to being septic emboli. ID recommends oral treatment for culture negative endocarditis with PO Linezolid and levofloxacin after inpatient stay until Monday to increase likelihood of successful treatment for both endocarditis and HIV. Duration of therapy until 3/1. Pain to L side related to septic emboli has been difficult to control, but improved with initiation of suboxone --Infectious disease following, recommendations appreciated --Monitor vital signs, fever curve  --Linezolid 600 mg BID,  Levofloxacin 750 mg daily (end date 3/1) --1/28 Blood cultures: no growth x 5 days (final) --1/30 Blood cultures: no growth x 3 days --Pain control & OUD: Suboxone 8-2 mg BID   Acute Kidney Injury Pt has had Cr wnl during admission with recent increase to 1.28 (from 0.90), likely due to a combination of Vancomycin and Toradol administration. Today, Cr fairly stable at 1.3, will continue to monitor closely, will hopefully improve with cessation of Vanc and toradol. -- Monitor BMP, avoid further nephrotoxins   HIV, Hepatitis C: Current CD4 = 380, viral load penidng, Hepatitis C Ab +, RNA and genotype pending. Hep B negative and RPR non-reactive. Health department contacted on 1/29 as pt is with-holding status from partner.   --Continue Triumeq daily --Will need infectious disease follow up on discharge for HIV, Hep C  Hx of polysubstance use UDS + for amphetamines, opiates, cocaine, THC. She has been using various drugs via IV injection. See separate progress note from 2/1 for further details regarding use history. Will consult OUD physician Monday to assess for appropriateness for OUD clinic follow up and suboxone maintenance therapy  --Monitor clinically for signs/symptoms of withdrawal --Cont Suboxone 8-2 mg BID  --SW for active drug use resources  -- Dr. Criselda Owens or Dr. Oswaldo Owens to see hopefully tomorrow for formal suboxone clinical consult   FEN/GI: --Regular diet  --No IVF  VTE Prophylaxis:Lovenox daily Code Status: Full  Dispo: Anticipated discharge Monday.  Pedro Poll, MD 11/21/2017, 8:10 AM Pager: [phone]

## 2017-11-22 ENCOUNTER — Telehealth: Payer: Self-pay | Admitting: Infectious Disease

## 2017-11-22 DIAGNOSIS — I2699 Other pulmonary embolism without acute cor pulmonale: Secondary | ICD-10-CM

## 2017-11-22 DIAGNOSIS — R079 Chest pain, unspecified: Secondary | ICD-10-CM

## 2017-11-22 DIAGNOSIS — B20 Human immunodeficiency virus [HIV] disease: Secondary | ICD-10-CM

## 2017-11-22 LAB — CULTURE, BLOOD (ROUTINE X 2)
CULTURE: NO GROWTH
Culture: NO GROWTH
SPECIAL REQUESTS: ADEQUATE
SPECIAL REQUESTS: ADEQUATE

## 2017-11-22 LAB — CREATININE, SERUM: Creatinine, Ser: 1.35 mg/dL — ABNORMAL HIGH (ref 0.61–1.24)

## 2017-11-22 MED ORDER — BENZONATATE 100 MG PO CAPS: 100.0000 mg | ORAL_CAPSULE | Freq: Three times a day (TID) | ORAL | 0 refills | Status: DC | PRN

## 2017-11-22 MED ORDER — ABACAVIR-DOLUTEGRAVIR-LAMIVUD 600-50-300 MG PO TABS: 1.0000 | ORAL_TABLET | Freq: Every day | ORAL | 5 refills | Status: DC

## 2017-11-22 MED ORDER — LEVOFLOXACIN 750 MG PO TABS: 750.0000 mg | ORAL_TABLET | Freq: Every day | ORAL | 0 refills | Status: DC

## 2017-11-22 MED ORDER — DICLOFENAC SODIUM 1 % TD GEL: 2.0000 g | Freq: Four times a day (QID) | TRANSDERMAL | 0 refills | Status: DC | PRN

## 2017-11-22 MED ORDER — LINEZOLID 600 MG PO TABS: 600.0000 mg | ORAL_TABLET | Freq: Two times a day (BID) | ORAL | 0 refills | Status: DC

## 2017-11-22 MED ORDER — BENZONATATE 100 MG PO CAPS: 100.0000 mg | ORAL_CAPSULE | Freq: Three times a day (TID) | ORAL | Status: DC | PRN

## 2017-11-22 NOTE — Progress Notes (Signed)
Patient complained of chest pain and that it hurt to breath.  Pain was located in the center of the chest.  EKG and vitals were completed and administered a GI cocktail to patient.  Physician on call notified.  Will continue to monitor.

## 2017-11-22 NOTE — Telephone Encounter (Signed)
Sending rx for TRIUMEQ and abx to [facility] outpatient pharmacy to try to coordinate her getting meds prior to potential DC

## 2017-11-22 NOTE — Progress Notes (Signed)
Subjective: Reports improved but persistent pain to L chest wall, was relieved with voltaren gel that was given this morning. Worsens with deep breaths. She was counseled on importance and duration of antibiotic therapy--will do the same for HIV medication when the pt does not have visitors. Appreciate ID assistance in coordinating medication delivery prior to increase likelihood of treatment success.     Objective:  Vital signs in last 24 hours: Vitals:   11/21/17 0516 11/21/17 1345 11/22/17 0417 11/22/17 0900  BP: 129/81 117/76 102/60   Pulse: (!) 103 100 (!) 116 (!) 103  Resp: 18  16   Temp: 99.4 F (37.4 C) 98.9 F (37.2 C)  98.7 F (37.1 C)  TempSrc:    Oral  SpO2: 93% (!) 87%  91%  Weight:      Height:        General: Sitting up in bed, no acute distress HEENT: normal conjunctiva CV: RRR with no murmur appreciated, TTP to left chest wall persistent but improved  Resp: Decreased breath sounds on the left, no wheezing  Extr: No edema Neuro: Alert and oriented x3 Skin: Warm, dry  Assessment/Plan:  Pedro Owens is a 27 yo fluid gender individual with PMH of HIV off ART >6  months and current IV drug use who presented with fever and was found to have pulmonary septic emboli on admission consistent with endocarditis. The patient was admitted to the internal medicine teaching service with infectious disease and cardiology consulting. The specific problems addressed during admission are as follows:  Pulmonary septic emboli consistent with endocarditis  Improved fever curve and afebrile last 24 hours. Blood cultures continue to have no growth. TEE was negative for vegetations though this may be due to migration to being septic emboli. ID recommends oral treatment for culture negative endocarditis with PO Linezolid and levofloxacin. Duration of therapy until 3/1. Pain to L side related to septic emboli has been difficult to control, but improved with initiation of suboxone and  voltaren gel  --Infectious disease following, recommendations appreciated --Monitor vital signs, fever curve  --Linezolid 600 mg BID, Levofloxacin 750 mg daily (end date 3/1) --1/28 Blood cultures: no growth x 5 days (final) --1/30 Blood cultures: no growth x 4 days --Pain control & OUD: Suboxone 8-2 mg BID, Voltaren gel qid prn    Acute Kidney Injury Pt has had Cr wnl during admission with recent increase to 1.28 (from 0.90), likely due to a combination of Vancomycin and Toradol administration. Cr has remained stable at 1.3, will continue to monitor closely, will hopefully continue to improve with cessation of Vanc and toradol. -- Monitor Cr, avoid further nephrotoxins   HIV, Hepatitis C: Current CD4 = 380, viral load 1240, Hepatitis C Ab +, RNA 2.7 million, genotype pending. Hep B negative and RPR non-reactive. Health department contacted on 1/29 as pt is with-holding status from partner.   --Continue Triumeq daily --Will need infectious disease follow up on discharge for HIV, Hep C  Hx of polysubstance use UDS + for amphetamines, opiates, cocaine, THC. She has been using various drugs via IV injection. See separate progress note from 2/1 for further details regarding use history. Will consult OUD physician today to assess for appropriateness for OUD clinic follow up and suboxone maintenance therapy  --Monitor clinically for signs/symptoms of withdrawal --Cont Suboxone 8-2 mg BID  --SW for active drug use resources  --OUD consult   FEN/GI: --Regular diet  --No IVF  VTE Prophylaxis:Lovenox daily Code Status: Full  Dispo:  Anticipated discharge 0-1 days   Ginger Carne, MD 11/22/2017, 10:57 AM Pager: [phone]

## 2017-11-22 NOTE — Progress Notes (Addendum)
Subjective:  C/o chest pain with lying flat relieved by sitting forward   Antibiotics:  Anti-infectives (From admission, onward)   Start     Dose/Rate Route Frequency Ordered Stop   11/19/17 2200  vancomycin (VANCOCIN) IVPB 750 mg/150 ml premix  Status:  Discontinued     750 mg 150 mL/hr over 60 Minutes Intravenous Every 8 hours 11/19/17 1454 11/19/17 1617   11/19/17 1830  levofloxacin (LEVAQUIN) tablet 750 mg     750 mg Oral Every 24 hours 11/19/17 1746     11/19/17 1617  ciprofloxacin (CIPRO) tablet 500 mg  Status:  Discontinued     500 mg Oral 2 times daily 11/19/17 1617 11/19/17 1746   11/19/17 1617  linezolid (ZYVOX) tablet 600 mg     600 mg Oral Every 12 hours 11/19/17 1617 12/17/17 2159   11/18/17 0752  cefTRIAXone (ROCEPHIN) 2 g in dextrose 5 % 50 mL IVPB  Status:  Discontinued     2 g 100 mL/hr over 30 Minutes Intravenous Every 24 hours 11/17/17 1033 11/19/17 1617   11/17/17 2200  vancomycin (VANCOCIN) IVPB 1000 mg/200 mL premix  Status:  Discontinued     1,000 mg 200 mL/hr over 60 Minutes Intravenous Every 8 hours 11/17/17 1347 11/19/17 1454   11/17/17 1300  abacavir-dolutegravir-lamiVUDine (TRIUMEQ) 600-50-300 MG per tablet 1 tablet     1 tablet Oral Daily 11/17/17 1158     11/15/17 2000  vancomycin (VANCOCIN) IVPB 750 mg/150 ml premix  Status:  Discontinued     750 mg 150 mL/hr over 60 Minutes Intravenous Every 8 hours 11/15/17 1301 11/17/17 1347   11/15/17 2000  cefTRIAXone (ROCEPHIN) 2 g in dextrose 5 % 50 mL IVPB  Status:  Discontinued     2 g 100 mL/hr over 30 Minutes Intravenous Every 12 hours 11/15/17 1555 11/17/17 1033   11/15/17 1800  piperacillin-tazobactam (ZOSYN) IVPB 3.375 g  Status:  Discontinued     3.375 g 12.5 mL/hr over 240 Minutes Intravenous Every 8 hours 11/15/17 1301 11/15/17 1515   11/15/17 1215  vancomycin (VANCOCIN) 1,500 mg in sodium chloride 0.9 % 500 mL IVPB     1,500 mg 250 mL/hr over 120 Minutes Intravenous  Once 11/15/17 1207  11/15/17 1716   11/15/17 1215  piperacillin-tazobactam (ZOSYN) IVPB 3.375 g     3.375 g 100 mL/hr over 30 Minutes Intravenous  Once 11/15/17 1207 11/15/17 1243      Medications: Scheduled Meds: . abacavir-dolutegravir-lamiVUDine  1 tablet Oral Daily  . buprenorphine-naloxone  1 tablet Sublingual BID  . enoxaparin (LOVENOX) injection  40 mg Subcutaneous Q24H  . levofloxacin  750 mg Oral Q24H  . lidocaine  1 patch Transdermal Daily  . linezolid  600 mg Oral Q12H  . nicotine  21 mg Transdermal Daily  . pantoprazole  40 mg Oral Daily   Continuous Infusions: PRN Meds:.acetaminophen, benzonatate, diclofenac sodium, gi cocktail, ondansetron, traZODone    Objective: Weight change:   Intake/Output Summary (Last 24 hours) at 11/22/2017 2110 Last data filed at 11/22/2017 1350 Gross per 24 hour  Intake 480 ml  Output -  Net 480 ml   Blood pressure 110/69, pulse 90, temperature 98.2 F (36.8 C), temperature source Oral, resp. rate 16, height 5\' 11"  (1.803 m), weight 168 lb (76.2 kg), SpO2 92 %. Temp:  [98.2 F (36.8 C)-98.7 F (37.1 C)] 98.2 F (36.8 C) (02/04 1427) Pulse Rate:  [90-116] 90 (02/04 1427) Resp:  [16] 16 (02/04  1427) BP: (102-110)/(60-69) 110/69 (02/04 1427) SpO2:  [91 %-92 %] 92 % (02/04 1427)  Physical Exam: General: Alert and awake, oriented x3, not in any acute distress. HEENT: anicteric sclera,EOMI CVS regular rate, normal r,  no murmur rubs or gallops Chest: clear to auscultation bilaterally, no wheezing, rales or rhonchi Abdomen: soft nontender, nondistended, normal bowel sounds, Extremities: no  clubbing or edema noted bilaterally  Neuro: nonfocal  CBC: CBC Latest Ref Rng & Units 11/21/2017 11/19/2017 11/17/2017  WBC 4.0 - 10.5 K/uL 13.1(H) 12.1(H) 21.6(H)  Hemoglobin 13.0 - 17.0 g/dL 12.3(L) 12.4(L) 13.8  Hematocrit 39.0 - 52.0 % 37.3(L) 36.6(L) 40.8  Platelets 150 - 400 K/uL 416(H) 333 282      BMET Recent Labs    11/20/17 0408 11/21/17 0725  11/22/17 0552  NA 136 135  --   K 3.0* 4.4  --   CL 100* 101  --   CO2 25 25  --   GLUCOSE 126* 126*  --   BUN 10 8  --   CREATININE 1.35* 1.30* 1.35*  CALCIUM 8.0* 8.3*  --      Liver Panel  No results for input(s): PROT, ALBUMIN, AST, ALT, ALKPHOS, BILITOT, BILIDIR, IBILI in the last 72 hours.     Sedimentation Rate No results for input(s): ESRSEDRATE in the last 72 hours. C-Reactive Protein No results for input(s): CRP in the last 72 hours.  Micro Results: Recent Results (from the past 720 hour(s))  Blood Culture (routine x 2)     Status: None   Collection Time: 11/15/17 12:15 PM  Result Value Ref Range Status   Specimen Description BLOOD RIGHT ANTECUBITAL  Final   Special Requests   Final    BOTTLES DRAWN AEROBIC AND ANAEROBIC Blood Culture adequate volume   Culture   Final    NO GROWTH 5 DAYS Performed at [facility], [address], [place], [place] [address]    Report Status 11/20/2017 FINAL  Final  Blood Culture (routine x 2)     Status: None   Collection Time: 11/15/17 12:50 PM  Result Value Ref Range Status   Specimen Description BLOOD LEFT HAND  Final   Special Requests   Final    BOTTLES DRAWN AEROBIC AND ANAEROBIC Blood Culture results may not be optimal due to an inadequate volume of blood received in culture bottles   Culture   Final    NO GROWTH 5 DAYS Performed at [facility], [address] [place], [place] [address]    Report Status 11/20/2017 FINAL  Final  Culture, blood (routine x 2)     Status: None   Collection Time: 11/17/17  9:05 AM  Result Value Ref Range Status   Specimen Description BLOOD LEFT ARM  Final   Special Requests IN PEDIATRIC BOTTLE Blood Culture adequate volume  Final   Culture   Final    NO GROWTH 5 DAYS Performed at [facility], [address] [place], [place] [address]    Report Status 11/22/2017 FINAL  Final  Culture, blood (routine x 2)     Status: None   Collection Time: 11/17/17  9:10 AM    Result Value Ref Range Status   Specimen Description BLOOD LEFT ANTECUBITAL  Final   Special Requests IN PEDIATRIC BOTTLE Blood Culture adequate volume  Final   Culture   Final    NO GROWTH 5 DAYS Performed at [facility] [place] [facility], [address] [place], [place]  [address]    Report Status 11/22/2017 FINAL  Final    Studies/Results: No results found.    Assessment/Plan:  INTERVAL HISTORY: chest pain    Principal Problem:   Endocarditis, suspected Active Problems:   Polysubstance abuse (HCC)   SIRS (systemic inflammatory response syndrome) (HCC)   Hepatitis C antibody test positive   Septic pulmonary embolism (HCC)   Acute endocarditis   Pleuritic chest pain   IVDU (intravenous drug user)    Pedro Owens is a 27 y.o. male with  HIV disease not well controlled due to poor adherence, IVDU, transgender identity (gender fluid) who is admitted with picture of septic emboli to the lungs but with negative blood cultures and negative TEE  # 1 Culture negative endocarditis with emobolization to lungs  28 days of rx for linezolid and levaquin filled at [facility] pharmacy and hand delivered to the patient by pharmacy  #2 HIV disease: #30 days of TRIUMEQ also hand delivered to patient via pharmacy  Going forward we will fill at [facility] and mail to the patient  We will arrange for followup in our clinic in the next few weeks.  I spent greater than 35  minutes with the patient including greater than 50% of time in face to face counsel of the patient re importance of adherence to her ARV regimen and in coordination of  Her care with ID pharmacy and procuring medications for the patient.   LOS: 7 days   Acey Lav 11/22/2017, 9:10 PM

## 2017-11-23 ENCOUNTER — Encounter: Payer: Self-pay | Admitting: Oncology

## 2017-11-23 ENCOUNTER — Inpatient Hospital Stay (HOSPITAL_COMMUNITY)
Admission: AD | Admit: 2017-11-23 | Discharge: 2017-12-01 | DRG: 163 | Disposition: A | Discharge status: Home / Self Care | Payer: BLUE CROSS/BLUE SHIELD | Source: Ambulatory Visit | Attending: Oncology | Admitting: Oncology

## 2017-11-23 ENCOUNTER — Inpatient Hospital Stay (HOSPITAL_COMMUNITY): Payer: BLUE CROSS/BLUE SHIELD

## 2017-11-23 ENCOUNTER — Encounter (HOSPITAL_COMMUNITY): Payer: Self-pay | Admitting: Radiology

## 2017-11-23 ENCOUNTER — Ambulatory Visit (HOSPITAL_COMMUNITY)
Admission: RE | Admit: 2017-11-23 | Discharge: 2017-11-23 | Disposition: A | Discharge status: Home / Self Care | Payer: BLUE CROSS/BLUE SHIELD | Source: Ambulatory Visit | Attending: Internal Medicine | Admitting: Internal Medicine

## 2017-11-23 ENCOUNTER — Ambulatory Visit (INDEPENDENT_AMBULATORY_CARE_PROVIDER_SITE_OTHER): Payer: BLUE CROSS/BLUE SHIELD | Admitting: Internal Medicine

## 2017-11-23 ENCOUNTER — Encounter: Payer: Self-pay | Admitting: Internal Medicine

## 2017-11-23 VITALS — BP 105/71 | HR 66 | Temp 98.0°F | Resp 22 | Wt 157.7 lb

## 2017-11-23 DIAGNOSIS — Z9889 Other specified postprocedural states: Secondary | ICD-10-CM | POA: Diagnosis not present

## 2017-11-23 DIAGNOSIS — I10 Essential (primary) hypertension: Secondary | ICD-10-CM | POA: Diagnosis present

## 2017-11-23 DIAGNOSIS — G894 Chronic pain syndrome: Secondary | ICD-10-CM | POA: Diagnosis present

## 2017-11-23 DIAGNOSIS — F649 Gender identity disorder, unspecified: Secondary | ICD-10-CM | POA: Diagnosis present

## 2017-11-23 DIAGNOSIS — Z806 Family history of leukemia: Secondary | ICD-10-CM

## 2017-11-23 DIAGNOSIS — I330 Acute and subacute infective endocarditis: Secondary | ICD-10-CM

## 2017-11-23 DIAGNOSIS — Z8701 Personal history of pneumonia (recurrent): Secondary | ICD-10-CM | POA: Diagnosis not present

## 2017-11-23 DIAGNOSIS — F1990 Other psychoactive substance use, unspecified, uncomplicated: Secondary | ICD-10-CM | POA: Diagnosis not present

## 2017-11-23 DIAGNOSIS — I2690 Septic pulmonary embolism without acute cor pulmonale: Secondary | ICD-10-CM

## 2017-11-23 DIAGNOSIS — B1920 Unspecified viral hepatitis C without hepatic coma: Secondary | ICD-10-CM | POA: Diagnosis present

## 2017-11-23 DIAGNOSIS — B20 Human immunodeficiency virus [HIV] disease: Secondary | ICD-10-CM | POA: Diagnosis present

## 2017-11-23 DIAGNOSIS — J939 Pneumothorax, unspecified: Secondary | ICD-10-CM

## 2017-11-23 DIAGNOSIS — F319 Bipolar disorder, unspecified: Secondary | ICD-10-CM | POA: Diagnosis present

## 2017-11-23 DIAGNOSIS — I38 Endocarditis, valve unspecified: Secondary | ICD-10-CM | POA: Diagnosis present

## 2017-11-23 DIAGNOSIS — Z9689 Presence of other specified functional implants: Secondary | ICD-10-CM | POA: Diagnosis not present

## 2017-11-23 DIAGNOSIS — F17210 Nicotine dependence, cigarettes, uncomplicated: Secondary | ICD-10-CM | POA: Diagnosis present

## 2017-11-23 DIAGNOSIS — R64 Cachexia: Secondary | ICD-10-CM | POA: Diagnosis not present

## 2017-11-23 DIAGNOSIS — Z8249 Family history of ischemic heart disease and other diseases of the circulatory system: Secondary | ICD-10-CM

## 2017-11-23 DIAGNOSIS — F1210 Cannabis abuse, uncomplicated: Secondary | ICD-10-CM | POA: Diagnosis present

## 2017-11-23 DIAGNOSIS — I76 Septic arterial embolism: Secondary | ICD-10-CM

## 2017-11-23 DIAGNOSIS — Z789 Other specified health status: Secondary | ICD-10-CM

## 2017-11-23 DIAGNOSIS — Z885 Allergy status to narcotic agent status: Secondary | ICD-10-CM | POA: Diagnosis not present

## 2017-11-23 DIAGNOSIS — L8992 Pressure ulcer of unspecified site, stage 2: Secondary | ICD-10-CM | POA: Diagnosis not present

## 2017-11-23 DIAGNOSIS — Z886 Allergy status to analgesic agent status: Secondary | ICD-10-CM | POA: Diagnosis not present

## 2017-11-23 DIAGNOSIS — F640 Transsexualism: Secondary | ICD-10-CM | POA: Diagnosis not present

## 2017-11-23 DIAGNOSIS — J90 Pleural effusion, not elsewhere classified: Secondary | ICD-10-CM | POA: Diagnosis not present

## 2017-11-23 DIAGNOSIS — F909 Attention-deficit hyperactivity disorder, unspecified type: Secondary | ICD-10-CM | POA: Diagnosis present

## 2017-11-23 DIAGNOSIS — J869 Pyothorax without fistula: Secondary | ICD-10-CM | POA: Diagnosis present

## 2017-11-23 DIAGNOSIS — Z91041 Radiographic dye allergy status: Secondary | ICD-10-CM | POA: Diagnosis not present

## 2017-11-23 DIAGNOSIS — R0989 Other specified symptoms and signs involving the circulatory and respiratory systems: Secondary | ICD-10-CM

## 2017-11-23 DIAGNOSIS — R0602 Shortness of breath: Secondary | ICD-10-CM

## 2017-11-23 DIAGNOSIS — L8990 Pressure ulcer of unspecified site, unspecified stage: Secondary | ICD-10-CM

## 2017-11-23 LAB — COMPREHENSIVE METABOLIC PANEL
ALT: 27 U/L (ref 17–63)
AST: 45 U/L — AB (ref 15–41)
Albumin: 2.6 g/dL — ABNORMAL LOW (ref 3.5–5.0)
Alkaline Phosphatase: 57 U/L (ref 38–126)
Anion gap: 11 (ref 5–15)
BILIRUBIN TOTAL: 0.5 mg/dL (ref 0.3–1.2)
BUN: 17 mg/dL (ref 6–20)
CO2: 26 mmol/L (ref 22–32)
CREATININE: 1.29 mg/dL — AB (ref 0.61–1.24)
Calcium: 8.7 mg/dL — ABNORMAL LOW (ref 8.9–10.3)
Chloride: 96 mmol/L — ABNORMAL LOW (ref 101–111)
Glucose, Bld: 117 mg/dL — ABNORMAL HIGH (ref 65–99)
POTASSIUM: 4.5 mmol/L (ref 3.5–5.1)
Sodium: 133 mmol/L — ABNORMAL LOW (ref 135–145)
Total Protein: 7.7 g/dL (ref 6.5–8.1)

## 2017-11-23 LAB — CBC WITH DIFFERENTIAL/PLATELET
Basophils Absolute: 0 10*3/uL (ref 0.0–0.1)
Basophils Relative: 0 %
Eosinophils Absolute: 0.5 10*3/uL (ref 0.0–0.7)
Eosinophils Relative: 3 %
HEMATOCRIT: 38 % — AB (ref 39.0–52.0)
HEMOGLOBIN: 12.9 g/dL — AB (ref 13.0–17.0)
Lymphocytes Relative: 12 %
Lymphs Abs: 1.8 10*3/uL (ref 0.7–4.0)
MCH: 28.4 pg (ref 26.0–34.0)
MCHC: 33.9 g/dL (ref 30.0–36.0)
MCV: 83.7 fL (ref 78.0–100.0)
MONO ABS: 1 10*3/uL (ref 0.1–1.0)
MONOS PCT: 7 %
Neutro Abs: 12.3 10*3/uL — ABNORMAL HIGH (ref 1.7–7.7)
Neutrophils Relative %: 78 %
Platelets: 565 10*3/uL — ABNORMAL HIGH (ref 150–400)
RBC: 4.54 MIL/uL (ref 4.22–5.81)
RDW: 12.9 % (ref 11.5–15.5)
WBC: 15.6 10*3/uL — AB (ref 4.0–10.5)

## 2017-11-23 LAB — HEPATITIS C GENOTYPE

## 2017-11-23 MED ORDER — SODIUM CHLORIDE 0.9 % IV SOLN
INTRAVENOUS | Status: AC
  Administered 2017-11-23 – 2017-11-24 (×2): via INTRAVENOUS

## 2017-11-23 MED ORDER — LINEZOLID 600 MG PO TABS
600.0000 mg | ORAL_TABLET | Freq: Two times a day (BID) | ORAL | Status: DC
  Administered 2017-11-23: 600 mg via ORAL
  Filled 2017-11-23 (×2): qty 1

## 2017-11-23 MED ORDER — BUPRENORPHINE HCL 2 MG SL SUBL: 8.0000 mg | SUBLINGUAL_TABLET | Freq: Once | SUBLINGUAL | Status: DC

## 2017-11-23 MED ORDER — BUPRENORPHINE HCL 2 MG SL SUBL
8.0000 mg | SUBLINGUAL_TABLET | Freq: Once | SUBLINGUAL | Status: AC
  Administered 2017-11-23: 8 mg via SUBLINGUAL
  Filled 2017-11-23: qty 1
  Filled 2017-11-23: qty 4

## 2017-11-23 MED ORDER — DICLOFENAC SODIUM 1 % TD GEL
2.0000 g | Freq: Four times a day (QID) | TRANSDERMAL | Status: DC | PRN
  Administered 2017-11-24 – 2017-11-25 (×5): 2 g via TOPICAL
  Filled 2017-11-23: qty 100

## 2017-11-23 MED ORDER — IOPAMIDOL (ISOVUE-300) INJECTION 61%
INTRAVENOUS | Status: AC
  Administered 2017-11-23: 50 mL
  Filled 2017-11-23: qty 50

## 2017-11-23 MED ORDER — ACETAMINOPHEN 325 MG PO TABS
650.0000 mg | ORAL_TABLET | Freq: Once | ORAL | Status: AC
  Administered 2017-11-23: 650 mg via ORAL

## 2017-11-23 MED ORDER — RAMELTEON 8 MG PO TABS
8.0000 mg | ORAL_TABLET | Freq: Every day | ORAL | Status: AC
  Administered 2017-11-23: 8 mg via ORAL
  Filled 2017-11-23: qty 1

## 2017-11-23 MED ORDER — ONDANSETRON HCL 4 MG/2ML IJ SOLN: 4.0000 mg | Freq: Four times a day (QID) | INTRAMUSCULAR | Status: DC | PRN

## 2017-11-23 MED ORDER — TRAZODONE HCL 50 MG PO TABS: 50.0000 mg | ORAL_TABLET | Freq: Every day | ORAL | Status: DC

## 2017-11-23 MED ORDER — ABACAVIR-DOLUTEGRAVIR-LAMIVUD 600-50-300 MG PO TABS
1.0000 | ORAL_TABLET | Freq: Every day | ORAL | Status: DC
  Administered 2017-11-24 – 2017-12-01 (×8): 1 via ORAL
  Filled 2017-11-23 (×8): qty 1

## 2017-11-23 MED ORDER — LEVOFLOXACIN 750 MG PO TABS: 750.0000 mg | ORAL_TABLET | Freq: Every day | ORAL | Status: DC

## 2017-11-23 MED ORDER — BUPRENORPHINE HCL-NALOXONE HCL 8-2 MG SL SUBL
1.0000 | SUBLINGUAL_TABLET | Freq: Two times a day (BID) | SUBLINGUAL | Status: DC
  Administered 2017-11-23 – 2017-11-25 (×4): 1 via SUBLINGUAL
  Filled 2017-11-23 (×4): qty 1

## 2017-11-23 MED ORDER — ONDANSETRON HCL 4 MG PO TABS: 4.0000 mg | ORAL_TABLET | Freq: Four times a day (QID) | ORAL | Status: DC | PRN

## 2017-11-23 MED ORDER — NICOTINE 14 MG/24HR TD PT24
14.0000 mg | MEDICATED_PATCH | Freq: Every day | TRANSDERMAL | Status: DC
  Administered 2017-11-23 – 2017-12-01 (×5): 14 mg via TRANSDERMAL
  Filled 2017-11-23 (×6): qty 1

## 2017-11-23 MED ORDER — ACETAMINOPHEN 325 MG PO TABS: 650.0000 mg | ORAL_TABLET | Freq: Four times a day (QID) | ORAL | Status: DC | PRN

## 2017-11-23 MED ORDER — BUPRENORPHINE HCL-NALOXONE HCL 8-2 MG SL SUBL: 1.0000 | SUBLINGUAL_TABLET | Freq: Two times a day (BID) | SUBLINGUAL | Status: DC

## 2017-11-23 MED ORDER — ACETAMINOPHEN 650 MG RE SUPP: 650.0000 mg | Freq: Four times a day (QID) | RECTAL | Status: DC | PRN

## 2017-11-23 NOTE — Progress Notes (Signed)
Temp 98. Resting in chair.  No complaints at present.  Angelina Ok, RN 11/23/2016 4:10 PM.

## 2017-11-23 NOTE — Progress Notes (Signed)
Pt admitted to 5 west room 20, direct admit from Internal Medicine. Pt A&O x 4. Call bell within reach.

## 2017-11-23 NOTE — Assessment & Plan Note (Signed)
Worsening WBC and CXR as noted above.  CXR with possible overlying pneumonia vs. Pleural effusion.  Awaiting official radiology reading.    Plan Admit to telemetry bed for further work up.   Discussed plan with his mother on the phone and admitting medicine team.

## 2017-11-23 NOTE — Assessment & Plan Note (Signed)
Patient presents with worsening chest pain, worsening SOB and fever and tachycardia in clinic.  CXR is abnormal compared with previous CXR and CT chest which were reviewed.  WBC is trending up.  Concern for worsening pneumonia vs. Pleural effusion, official radiology read is pending.    Plan to admit to the medicine teaching service for further work up.

## 2017-11-23 NOTE — Progress Notes (Signed)
   Subjective:    Patient ID: Pedro Owens, male    DOB: 29-Jun-1991, 27 y.o.   MRN: [id number]  HPI  On initial evaluation, patient was ill appearing.  He reports that he was not feeling well when he left the hospital yesterday, but that he thought it was due to initiation of suboxone.  He reports that the suboxone helps his pain a lot, but his other symptoms are no better.  He notes left sided chest pain which changes with movement and taking a deep breath, thought to be MSK and pleuritic.  He had an AKI thought to be related to vancomycin and toradol so all NSAIDs were stopped and he was started on lidocaine which he does have.  He reports that the suboxone helps this pain, but he did not have it on discharge in anticipation of coming in to this clinic.  He does report DOE and SOB which is similar to when he was in the hospital, but he feels he needs oxygen for support.  He further reports continued fevers.  On initial evaluation today he had an O2 saturation of 93%, HR of 112 and temp of 99.38F.  These do not appear significantly different from when he was discharged.  He further notes severe fatigue, chills, sweats, vomiting, decreased PO intake.  He states that his urine output is normal and his urine color has improved to light yellow.   Work up in the clinic shows rising WBC, temperature is rising to 100.6.  Ambulation with O2 saturation showed stable oxygenation.  CXR shows a partial Diop out of left lung, possible fluid accumulation vs. Pneumonia.  Awaiting official reading.  Possibly overlying pneumonia.  Patient now with dry cough and worsening pain of the left upper chest.  Renal function is stable, to slightly improving.    Review of Systems  Constitutional: Positive for chills, diaphoresis, fatigue and fever.  Respiratory: Positive for cough, chest tightness, shortness of breath and wheezing.   Cardiovascular: Positive for chest pain and palpitations. Negative for leg swelling.    Musculoskeletal: Positive for back pain.       Objective:   Physical Exam  Constitutional: He is oriented to person, place, and time. He appears distressed.  HENT:  Head: Normocephalic and atraumatic.  Cardiovascular:  No extrasystoles are present. Tachycardia present. PMI is not displaced. Exam reveals distant heart sounds. Exam reveals no gallop and no friction rub.  No murmur heard. Pulmonary/Chest: No accessory muscle usage. He has decreased breath sounds in the left middle field and the left lower field. He has wheezes in the left middle field and the left lower field. He exhibits tenderness.  Neurological: He is alert and oriented to person, place, and time. He exhibits normal muscle tone.  Skin: Skin is warm and dry. He is not diaphoretic.  Psychiatric: He has a normal mood and affect. His behavior is normal.  Vitals reviewed.   CMET and CBC done      Assessment & Plan:  RTC once discharged.

## 2017-11-23 NOTE — H&P (Signed)
Date: 11/23/2017               Patient Name:  TREYDEN ARNESEN MRN: [id number]  DOB: October 06, 1991 Age / Sex: 27 y.o., male   PCP: Diamantina Providence, FNP         Medical Service: Internal Medicine Teaching Service         Attending Physician: Dr. Inez Catalina, MD    First Contact: Dr. Anthonette Legato Pager: [phone]  Second Contact: Dr. Antony Contras  Pager: [phone]       After Hours (After 5p/  First Contact Pager: [phone]  weekends / holidays): Second Contact Pager: [phone]   Chief Complaint: Dyspnea, chest pain   History of Present Illness: Crist Fulton is a 27 yo M who identifies as male with a history of HIV (uncontrolled until recently), IV drug use, and culture negative endocarditis who presented to clinic follow up with persistent dyspnea and chest pain.   She was recently admitted from 1/28-2/4 with pulmonary septic emboli due to endocarditis which was culture negative and TEE without vegetations. She received several days of IV Vanc/Ceftriaxone before transitioning to oral regimen of Linezolid and levofloxacin per ID recommendations, planned for course until 3/1. She has been adherent with this regimen. See discharge summary for further details.   Since discharge, she reports continued dyspnea and L sided chest/shoulder pain that worsens with exertion. Also notes non-productive cough which also worsens with exertion, and subjective fever. She states the voltaren gel has helped with the chest wall pain, as well as the suboxone. Denies n/v/d, is tolerating PO intake (ate pizza for dinner last night).    In the clinic, T 100.6, HR 112, 93% on RA. CBC showed increased WBC of 15.6 compared to 13.1 on discharge. His Cr was similar (1.29 compared to 1.35). A CXR was obtained which showed new opacification of the majority of the L hemithorax, likely a pleural based process rather than parenchymal per review and discussion with radiology. A POC US performed in the clinic showed a loculated pleural  effusion with thick septations. She was admitted for further management.   Meds:  No outpatient medications have been marked as taking for the 11/23/17 encounter [facility] Encounter).     Allergies: Allergies as of 11/23/2017 - Review Complete 11/23/2017  Allergen Reaction Noted  . Acetaminophen Diarrhea 04/08/2016  . Other  11/04/2016  . Vicodin [hydrocodone-acetaminophen] Nausea And Vomiting 11/19/2013  . Dilaudid [hydromorphone hcl] Hives and Rash 09/24/2010   Past Medical History:  Diagnosis Date  . ADHD (attention deficit hyperactivity disorder)   . Anal warts   . Anxiety   . Asthma   . Bipolar disorder (HCC)   . Chronic bronchitis (HCC)   . Chronic pain syndrome   . Convulsions (HCC) 08/14/2015  . Depression   . Gonorrhea 09/08/11  . Headache    "weekly" (11/06/2016)  . Hemorrhoids, internal, with bleeding 08/25/2011  . Hepatitis C   . Herpes   . HIV infection (HCC)   . Hypertension   . ILEITIS 09/24/2010   Qualifier: Diagnosis of  By: Nelson-Smith CMA (AAMA), Dottie    . Lymphoid hyperplasia, reactive   . Marijuana abuse   . Migraine    "monthly" (11/06/2016)  . Pseudoseizures   . Seizures (HCC)    "don't know why I have them" (11/06/2016)  . Syphilis     Family History:  Family History  Problem Relation Age of Onset  . Diabetes Mother   . Irritable  bowel syndrome Mother   . Hypertension Mother   . Hyperlipidemia Mother   . Hypothyroidism Mother   . Diabetes Maternal Uncle   . Heart disease Maternal Grandmother   . Leukemia Other        maternal greatgrandmother  . Colon cancer Neg Hx      Social History:  Social History   Tobacco Use  . Smoking status: Current Every Day Smoker    Packs/day: 1.50    Years: 11.00    Pack years: 16.50    Types: Cigarettes  . Smokeless tobacco: Never Used  Substance Use Topics  . Alcohol use: Yes    Comment: 11/06/2016 "nothing in a very long time"  . Drug use: Yes    Frequency: 2.0 times per week    Types:  Cocaine, Benzodiazepines, Oxycodone, Marijuana, Heroin    Comment: yesterday     Review of Systems: A complete ROS was negative except as per HPI.   Physical Exam: Vitals from clinic reviewed.  General: Ill appearing but not in acute distress currently  Head: Normocephalic, atraumatic Eyes: PERRL, normal conjuctiva ENT: Moist mucus membranes, no pharyngeal exudate CV: Tachycardic, regular rhythm, no murmur appreciated Resp: Decreased breath sounds on L, clear on R. TTP of L anterior and lateral chest wall with no obvious masses of deformities   Abd: Soft, +BS, non-tender to palpation  Extr: No LE edema, good cap refill and peripheral pulses  Neuro: Alert and oriented x3  Skin: Warm, dry    CXR: personally reviewed my interpretation is described above within HPI.   Assessment & Plan by Problem:  27 yo transgender male with a hx of HIV (previously uncontrolled), IV drug use who was recently admitted with pulmonary septic embolic d/t suspected culture negative endocarditis re-presenting with CXR concerning for loculated pleural effusion.   Dyspnea, Loculated Pleural Effusion Pt presenting with persistent dyspnea and chest wall pain which was previously attributed to sequelae of septic pulmonary emboli and controlled with voltaren gel and suboxone upon recent discharge. She has had increased leukocytosis and interval development of a L sided loculated pleural effusion demonstrated on CXR and point of care [place], awaiting CT chest for better characterization. Depending on the extent and complexity of the effusion, this will require drainage by IR or potentially CT surgery. The resulting labs from a sample will hopefully also provide identification of an organism for her overall presentation that would further guide anti-biotic therapy, will continue previous regimen for now.  --Monitor vital signs, supplemental O2 prn  --NPO at midnight, hold DVT ppx  --F/u CT chest  --CBC --Cont Suboxone,  Voltaren gel, Tylenol for pain control  --Maintenance IVF at 125 /hr x 12 hours   Culture Negative Endocarditis Recently discharged with PO antibiotic regimen of Linezolid BID and Levofloxacin for the treatment of culture negative endocarditis in the setting of IV drug use history. She has been adherent on this regimen, though now presenting with potential extension of infection to pleural space with loculation. ID followed previously and made aware of re-admission.  --Cont Linezolid 600 mg BID, Levofloxacin 750 mg daily   H/o HIV  Previously uncontrolled HIV, now on Triumeq which was started during last admission. CD4 380, viral load 1,240. Health department notified due to pt withholding status from partner. Has requested this not be discussed while others in room.  --Cont triumeq    H/o Polysubstance Abuse Pt has a long history of polysubstance use with IV use of heroin, cocaine, and [name] contributing  to her presentation. She has expressed a desire to cease her drug use and was started on suboxone for both analgesia and OUD. She followed up in the OUD clinic this morning where she was found to be persistently ill. We will continue suboxone as an inpatient and continue to facilitate outpatient follow up when medically stable for discharge.  --Suboxone 8-2 mg BID  Recent Acute Kidney Injury Baseline Cr around 0.9 which increased to 1.35 felt to be due to exposure of potential nephrotoxins including contrast for CTA, vancomycin and toradol. These agents were discontinued, however her renal function has remained fairly stable, now 1.29. Will continue to monitor. --BMP --Avoid nephrotoxins   H/o Hepatitis C Hepatitis C genotype remains pending, HCV RNA 2.7 million. Hepatitis B negative. Plans to follow as outpatient with ID for management of both HIV and Hep C.    Dispo: Admit patient to Inpatient with expected length of stay greater than 2 midnights.  Signed: Ginger Carne, MD 11/23/2017,  5:00 PM  Pager: [phone]

## 2017-11-23 NOTE — Progress Notes (Signed)
HPI: On initial evaluation, patient was ill appearing.  He reports that he was not feeling well when he left the hospital yesterday, but that he thought it was due to initiation of suboxone.  He reports that the suboxone helps his pain a lot, but his other symptoms are no better.  He notes left sided chest pain which changes with movement and taking a deep breath, thought to be MSK and pleuritic.  He had an AKI thought to be related to vancomycin and toradol so all NSAIDs were stopped and he was started on lidocaine which he does have.  He reports that the suboxone helps this pain, but he did not have it on discharge in anticipation of coming in to this clinic.  He does report DOE and SOB which is similar to when he was in the hospital, but he feels he needs oxygen for support.  He further reports continued fevers.  On initial evaluation today he had an O2 saturation of 93%, HR of 112 and temp of 99.67F.  These do not appear significantly different from when he was discharged.  He further notes severe fatigue, chills, sweats, vomiting, decreased PO intake.  He states that his urine output is normal and his urine color has improved to light yellow.    Initial plan - CMET and CBC to make sure labs are not worsening compared to discharge.   If labs appear okay, will have patient ambulate to eval for need for O2.   If no ambulatory need for O2, will plan to treat at home with short course of muscle relaxer, suboxone, continued lidocaine patch.  NSAIDs if renal function has returned to normal for a short course.

## 2017-11-24 ENCOUNTER — Encounter (HOSPITAL_COMMUNITY): Payer: Self-pay

## 2017-11-24 ENCOUNTER — Other Ambulatory Visit: Payer: Self-pay

## 2017-11-24 DIAGNOSIS — F17210 Nicotine dependence, cigarettes, uncomplicated: Secondary | ICD-10-CM

## 2017-11-24 DIAGNOSIS — Z885 Allergy status to narcotic agent status: Secondary | ICD-10-CM

## 2017-11-24 DIAGNOSIS — B20 Human immunodeficiency virus [HIV] disease: Secondary | ICD-10-CM

## 2017-11-24 DIAGNOSIS — J90 Pleural effusion, not elsewhere classified: Secondary | ICD-10-CM

## 2017-11-24 DIAGNOSIS — Z886 Allergy status to analgesic agent status: Secondary | ICD-10-CM

## 2017-11-24 DIAGNOSIS — Z91041 Radiographic dye allergy status: Secondary | ICD-10-CM

## 2017-11-24 DIAGNOSIS — R64 Cachexia: Secondary | ICD-10-CM

## 2017-11-24 DIAGNOSIS — B1920 Unspecified viral hepatitis C without hepatic coma: Secondary | ICD-10-CM

## 2017-11-24 DIAGNOSIS — I76 Septic arterial embolism: Secondary | ICD-10-CM

## 2017-11-24 DIAGNOSIS — J939 Pneumothorax, unspecified: Secondary | ICD-10-CM

## 2017-11-24 DIAGNOSIS — I2690 Septic pulmonary embolism without acute cor pulmonale: Secondary | ICD-10-CM

## 2017-11-24 DIAGNOSIS — Z789 Other specified health status: Secondary | ICD-10-CM

## 2017-11-24 DIAGNOSIS — F640 Transsexualism: Secondary | ICD-10-CM

## 2017-11-24 DIAGNOSIS — F1990 Other psychoactive substance use, unspecified, uncomplicated: Secondary | ICD-10-CM

## 2017-11-24 LAB — CBC
HEMATOCRIT: 38.4 % — AB (ref 39.0–52.0)
HEMOGLOBIN: 12.9 g/dL — AB (ref 13.0–17.0)
MCH: 28.2 pg (ref 26.0–34.0)
MCHC: 33.6 g/dL (ref 30.0–36.0)
MCV: 83.8 fL (ref 78.0–100.0)
Platelets: 599 10*3/uL — ABNORMAL HIGH (ref 150–400)
RBC: 4.58 MIL/uL (ref 4.22–5.81)
RDW: 13.2 % (ref 11.5–15.5)
WBC: 13.1 10*3/uL — AB (ref 4.0–10.5)

## 2017-11-24 LAB — URINALYSIS, ROUTINE W REFLEX MICROSCOPIC
Bilirubin Urine: NEGATIVE
Glucose, UA: NEGATIVE mg/dL
Hgb urine dipstick: NEGATIVE
Ketones, ur: NEGATIVE mg/dL
Leukocytes, UA: NEGATIVE
Nitrite: NEGATIVE
Protein, ur: NEGATIVE mg/dL
Specific Gravity, Urine: 1.011 (ref 1.005–1.030)
pH: 8 (ref 5.0–8.0)

## 2017-11-24 LAB — SURGICAL PCR SCREEN
MRSA, PCR: NEGATIVE
Staphylococcus aureus: NEGATIVE

## 2017-11-24 MED ORDER — LEVOFLOXACIN 750 MG PO TABS
750.0000 mg | ORAL_TABLET | Freq: Every day | ORAL | Status: DC
  Administered 2017-11-24 – 2017-11-26 (×3): 750 mg via ORAL
  Filled 2017-11-24 (×3): qty 1

## 2017-11-24 MED ORDER — VANCOMYCIN HCL IN DEXTROSE 750-5 MG/150ML-% IV SOLN: 750.0000 mg | Freq: Three times a day (TID) | INTRAVENOUS | Status: DC

## 2017-11-24 MED ORDER — LEVOFLOXACIN 750 MG PO TABS: 750.0000 mg | ORAL_TABLET | Freq: Every day | ORAL | Status: DC

## 2017-11-24 MED ORDER — RAMELTEON 8 MG PO TABS
8.0000 mg | ORAL_TABLET | Freq: Every day | ORAL | Status: AC
  Administered 2017-11-24 – 2017-11-25 (×2): 8 mg via ORAL
  Filled 2017-11-24 (×3): qty 1

## 2017-11-24 MED ORDER — LINEZOLID 600 MG PO TABS
600.0000 mg | ORAL_TABLET | Freq: Two times a day (BID) | ORAL | Status: DC
  Administered 2017-11-24 – 2017-11-26 (×5): 600 mg via ORAL
  Filled 2017-11-24 (×6): qty 1

## 2017-11-24 MED ORDER — DEXTROSE 5 % IV SOLN
2.0000 g | Freq: Two times a day (BID) | INTRAVENOUS | Status: DC
  Filled 2017-11-24: qty 2

## 2017-11-24 MED ORDER — LINEZOLID 600 MG PO TABS
600.0000 mg | ORAL_TABLET | Freq: Two times a day (BID) | ORAL | Status: DC
  Filled 2017-11-24: qty 1

## 2017-11-24 MED ORDER — BENZONATATE 100 MG PO CAPS
100.0000 mg | ORAL_CAPSULE | Freq: Three times a day (TID) | ORAL | Status: DC | PRN
  Administered 2017-11-24 – 2017-11-25 (×2): 100 mg via ORAL
  Filled 2017-11-24 (×2): qty 1

## 2017-11-24 MED ORDER — VANCOMYCIN HCL 10 G IV SOLR
1250.0000 mg | Freq: Once | INTRAVENOUS | Status: DC
  Administered 2017-11-24: 1250 mg via INTRAVENOUS
  Filled 2017-11-24: qty 1250

## 2017-11-24 NOTE — Consult Note (Signed)
[facility]    Date of Admission:  11/23/2017     Total days of antibiotics                Reason for Consult: Loculated Pleural Effusion / Culture Negative Endocartitis / HIV  Referring Provider: Cyndie Chime Primary Care Provider: Diamantina Providence, FNP   Assessment/Plan:  Pedro Owens was readmitted to the hospital following discharge with culture negative endocarditis and now with loculated pulmonary effusion. The loculated effusion with concern for seeding of septic emboli related to the endocarditis as she does have history of IV drug use. She is scheduled to undergo a VATS procedure on Friday. She was restarted on HIV medications during her most recent hospitalization and is positive for Hepatitis C.  1. Continue ceftriaxone and vancomyin pending VATS procedure. Will need continued long term antibiotics at discharge for endocarditis.  2. Continue Triumeq for ART. 3. Hepatitis C to be treated outpatient when current acute concerns are resolved.   Active Problems:   Loculated pleural effusion   . abacavir-dolutegravir-lamiVUDine  1 tablet Oral Daily  . buprenorphine-naloxone  1 tablet Sublingual BID  . levofloxacin  750 mg Oral Daily  . linezolid  600 mg Oral Q12H  . nicotine  14 mg Transdermal Daily     HPI: Pedro Owens is a 27 y.o. male who presents for evaluation of loculated pleural effusions.   Pedro Owens, who identifies himself as male, was admitted recently admitted to the hospital on January 28 and discharged on 11/23/17 with shortness of breath, cough and chest pain. Initial chest x-ray with no acute findings. CT scan showed atypical nodules concerning for septic emboli. She was experiencing fevers with concern for endocarditis, however TTE was negative and she was treated empirically with vancomycin and ceftriaxone. She was transitioned to Linazolid and levofloxacin at discharge. On the same day of discharge she was seen in clinic to  start outpatient opioid treatment with Suboxne, and was noted to have continued left lower chest and left upper abdominal chest pain. X-rays taken showed interval development of loculated pleural fluid collection in the left hemithorax worrisome for empyemas. CT evaluation of the chest with large multiloculated pleural effusion with mid rim enhancement and consolidation in the left lower lobe possibly indicating pneumonia or atelectasis.  There were also bilateral peripheral nodules with ground glass density suspect for pulmonary emboli. She has had waxing and waning low grade fevers. She was transitioned to ceftriaxone and vancomycin.  HIV was previously poorly controlled prior to recent hospitalization with a viral load of 1240 and CD4 count of 380. She was started on Triumeq at discharge. She was also positive for Hepatitis C during last admission.  Review of Systems: Review of Systems  Constitutional: Positive for fever. Negative for chills.  Respiratory: Positive for cough, shortness of breath and wheezing. Negative for sputum production.   Cardiovascular: Positive for chest pain. Negative for leg swelling.  Gastrointestinal: Negative for constipation, diarrhea, nausea and vomiting.  Skin: Negative for rash.  Neurological: Negative for weakness.     Past Medical History:  Diagnosis Date  . ADHD (attention deficit hyperactivity disorder)   . Anal warts   . Anxiety   . Asthma   . Bipolar disorder (HCC)   . Chronic bronchitis (HCC)   . Chronic pain syndrome   . Convulsions (HCC) 08/14/2015  . Depression   . Gonorrhea 09/08/11  . Headache    "weekly" (11/06/2016)  . Hemorrhoids, internal,  with bleeding 08/25/2011  . Hepatitis C   . Herpes   . HIV infection (HCC)   . Hypertension   . ILEITIS 09/24/2010   Qualifier: Diagnosis of  By: Nelson-Smith CMA (AAMA), Dottie    . Lymphoid hyperplasia, reactive   . Marijuana abuse   . Migraine    "monthly" (11/06/2016)  . Pseudoseizures   .  Seizures (HCC)    "don't know why I have them" (11/06/2016)  . Syphilis     Social History   Tobacco Use  . Smoking status: Current Every Day Smoker    Packs/day: 1.50    Years: 11.00    Pack years: 16.50    Types: Cigarettes  . Smokeless tobacco: Never Used  Substance Use Topics  . Alcohol use: Yes    Comment: 11/06/2016 "nothing in a very long time"  . Drug use: Yes    Frequency: 2.0 times per week    Types: Cocaine, Benzodiazepines, Oxycodone, Marijuana, Heroin    Comment: yesterday    Family History  Problem Relation Age of Onset  . Diabetes Mother   . Irritable bowel syndrome Mother   . Hypertension Mother   . Hyperlipidemia Mother   . Hypothyroidism Mother   . Diabetes Maternal Uncle   . Heart disease Maternal Grandmother   . Leukemia Other        maternal greatgrandmother  . Colon cancer Neg Hx     Allergies  Allergen Reactions  . Acetaminophen Diarrhea    Flares up crohns disease  . Other     IV CONTRAST  . Vicodin [Hydrocodone-Acetaminophen] Nausea And Vomiting  . Dilaudid [Hydromorphone Hcl] Hives and Rash    OBJECTIVE: Blood pressure 121/86, pulse 96, temperature 98.8 F (37.1 C), temperature source Oral, resp. rate 18, height 5\' 11"  (1.803 m), weight 162 lb 7.7 oz (73.7 kg), SpO2 99 %.  Physical Exam  Constitutional: He appears cachectic. He has a sickly appearance.  Cardiovascular: Normal rate, regular rhythm, normal heart sounds and intact distal pulses. Exam reveals no gallop and no friction rub.  No murmur heard. Pulmonary/Chest: Effort normal. He has wheezes. He exhibits no tenderness.  Abdominal: Soft. Bowel sounds are normal. He exhibits no distension and no mass. There is no tenderness. There is no rebound and no guarding.  Skin: Skin is warm and dry.    Lab Results Lab Results  Component Value Date   WBC 13.1 (H) 11/24/2017   HGB 12.9 (L) 11/24/2017   HCT 38.4 (L) 11/24/2017   MCV 83.8 11/24/2017   PLT 599 (H) 11/24/2017    Lab  Results  Component Value Date   CREATININE 1.29 (H) 11/23/2017   BUN 17 11/23/2017   NA 133 (L) 11/23/2017   K 4.5 11/23/2017   CL 96 (L) 11/23/2017   CO2 26 11/23/2017    Lab Results  Component Value Date   ALT 27 11/23/2017   AST 45 (H) 11/23/2017   ALKPHOS 57 11/23/2017   BILITOT 0.5 11/23/2017     Microbiology: Recent Results (from the past 240 hour(s))  Blood Culture (routine x 2)     Status: None   Collection Time: 11/15/17 12:15 PM  Result Value Ref Range Status   Specimen Description BLOOD RIGHT ANTECUBITAL  Final   Special Requests   Final    BOTTLES DRAWN AEROBIC AND ANAEROBIC Blood Culture adequate volume   Culture   Final    NO GROWTH 5 DAYS Performed at [facility], [address], [place], [place] [address]    Report Status 11/20/2017 FINAL  Final  Blood Culture (routine x 2)     Status: None   Collection Time: 11/15/17 12:50 PM  Result Value Ref Range Status   Specimen Description BLOOD LEFT HAND  Final   Special Requests   Final    BOTTLES DRAWN AEROBIC AND ANAEROBIC Blood Culture results may not be optimal due to an inadequate volume of blood received in culture bottles   Culture   Final    NO GROWTH 5 DAYS Performed at [facility], [address] [place], [place] [address]    Report Status 11/20/2017 FINAL  Final  Culture, blood (routine x 2)     Status: None   Collection Time: 11/17/17  9:05 AM  Result Value Ref Range Status   Specimen Description BLOOD LEFT ARM  Final   Special Requests IN PEDIATRIC BOTTLE Blood Culture adequate volume  Final   Culture   Final    NO GROWTH 5 DAYS Performed at [facility], [address] [place], [place] [address]    Report Status 11/22/2017 FINAL  Final  Culture, blood (routine x 2)     Status: None   Collection Time: 11/17/17  9:10 AM  Result Value Ref Range Status   Specimen Description BLOOD LEFT ANTECUBITAL  Final   Special Requests IN PEDIATRIC BOTTLE Blood Culture adequate volume   Final   Culture   Final    NO GROWTH 5 DAYS Performed at [facility], [address], [place], [place] [address]    Report Status 11/22/2017 FINAL  Final     Marcos Eke, NP [facility] [phone] Pager  11/24/2017  11:39 AM

## 2017-11-24 NOTE — Progress Notes (Signed)
Pharmacists Market researcher and students) rounding with the [employer]. Asked to comment upon possible alternatives for analgesia in this patient who is on suboxone.Drug-drug interaction checker application was used to evaluate the potential for drug-drug interactions with suboxone. Acetaminophen, NSAID'S, skeletal muscle relaxants, and anticonvulsants. Patient is identified as having an "allergic" reaction to acetaminophen (see documentation). NSAID'S could be used based upon the patient's current CrCl but me mindful of the potential for NSAIDS to impact upon the afferent glomerular arteriole to potentiate worsening renal decline. Gabapentin or pregabalin could similarly be used. Opioids would be contraindicated. Thank you for the opportunity to participate in this patient's care. Since there is documented history of substance abuse, the role of gabapentin should be used with caution due to possible dependency upon this drug.   Der Reyes Ivan, PharmD Candidate Sherlynn Stalls, PharmD Candidate Hulen Luster, PharmD, CACP, CPP

## 2017-11-24 NOTE — Progress Notes (Signed)
   Subjective: No acute events overnight following admission. Notes L sided chest wall pain that is persistent but relieved with voltaren gel and suboxone-has recently received dose prior to rounds. Understandably frustrated with having to be re-admitted and with development of complications.   Objective:  Vital signs in last 24 hours: Vitals:   11/23/17 2126 11/24/17 0534  BP: 119/80 121/86  Pulse: 80 96  Resp: 18 18  Temp: 98.6 F (37 C) 98.8 F (37.1 C)  TempSrc: Oral Oral  SpO2: 99% 99%  Weight: 162 lb 7.7 oz (73.7 kg)   Height: 5\' 11"  (1.803 m)    General: Resting in bed, no acute distress HEENT: PERRL, moist mucus membranes  CV: RRR, no murmur appreciated  Resp: Decreased breath sounds and dullness to percussion on L, no respiratory distress  Abd: Soft, +BS, non-tender  Extr: No LE edema  Neuro: Alert and oriented x3  Skin: Warm, dry      Assessment/Plan:  Loculated Pleural Effusion Pt presented with persistent chest wall pain and dyspnea, febrile with increased WBC. Imaging demonstrated interval development of a multi-loculated pleural effusion c/w likely empyema. CT surgery consulted, appreciate their assistance. Leukocytosis has improved and remains stable on room air.  --Monitor vital signs, supplemental O2 prn  --CT surgery planning VATS draining 2/8, NPO @ midnight prior   --CBC --Cont Suboxone, Voltaren gel, Tylenol for pain control    Culture Negative Endocarditis Recently discharged with PO antibiotic regimen of Linezolid BID and Levofloxacin for the treatment of culture negative endocarditis in the setting of IV drug use history, plan for course to extend to 3/1. With ID assistance, will continue this regimen which provides adequate empiric coverage. Will follow labs and cultures from empyema that would allow for more targeted anti-biotics.   --Cont Linezolid 600 mg BID, Levofloxacin 750 mg daily   H/o HIV  Previously uncontrolled HIV, now on Triumeq. CD4  380, viral load 1,240. Health department notified due to pt withholding status from partner. Has requested this not be discussed while others in room.  --Cont triumeq    H/o Polysubstance Abuse Pt has a long history of polysubstance use with IV use of heroin, cocaine, and [name]. Has expressed a desire to cease her drug use and was started on suboxone for both analgesia and OUD. Will continue suboxone as an inpatient and continue to facilitate outpatient follow up when medically stable for discharge.  --Suboxone 8-2 mg BID  Recent Acute Kidney Injury Baseline Cr around 0.9 which increased to 1.35 felt to be due to exposure of potential nephrotoxins including contrast for CTA, vancomycin and toradol. These agents were discontinued, however her renal function has remained fairly stable, most recently 1.29. Will continue to monitor. --BMP --Avoid nephrotoxins   Dispo: Anticipated discharge pending management of empyema.    Ginger Carne, MD 11/24/2017, 10:55 AM Pager: [phone]

## 2017-11-24 NOTE — Progress Notes (Signed)
Procedure(s) (LRB): VIDEO ASSISTED THORACOSCOPY (VATS)/DRAIN EMPYEMA (Left) Subjective: Patient examined, CT scan of chest images personally reviewed and counseled with patient  27 year old male who identifies as a male with history of IV drug abuse, HIV, hepatitis C, previously hospitalized in late January 2019 with left lung pulmonary septic emboli without evidence of endocarditis by echocardiogram. IV antibiotics were started and then converted to oral linezolid and Levaquin. The patient however developed persistent left chest pain and a chest x-ray showed probable loculated left pleural effusion-empyema and this was confirmed with CT scan showing large loculations both superiorly and inferiorly in the left pleural space. The patient has been readmitted and placed back on the same oral antibiotics. The patient is currently afebrile with Searles count 13k  Non acidotic. Blood cultures remain negative low-grade fever now 100.6. Objective: Vital signs in last 24 hours: Temp:  [98 F (36.7 C)-98.8 F (37.1 C)] 98.7 F (37.1 C) (02/06 1456) Pulse Rate:  [66-96] 69 (02/06 1456) Resp:  [18] 18 (02/06 1456) BP: (105-152)/(71-123) 152/123 (02/06 1456) SpO2:  [97 %-99 %] 97 % (02/06 1456) Weight:  [162 lb 7.7 oz (73.7 kg)] 162 lb 7.7 oz (73.7 kg) (02/05 2126)  Hemodynamic parameters for last 24 hours:    Intake/Output from previous day: No intake/output data recorded. Intake/Output this shift: No intake/output data recorded.       Exam    General- anxious, acutely ill young  AA male    Neck- no JVD, no cervical adenopathy palpable, no carotid bruit   Lungs-diminished breath sounds on left without deformity positive tenderness on left lateral chest   Cor- regular rate and rhythm, no murmur , gallop   Abdomen- soft, non-tender   Extremities - warm, non-tender, minimal edema   Neuro- oriented, appropriate, no focal weakness   Lab Results: Recent Labs    11/23/17 1040 11/24/17 0444   WBC 15.6* 13.1*  HGB 12.9* 12.9*  HCT 38.0* 38.4*  PLT 565* 599*   BMET:  Recent Labs    11/22/17 0552 11/23/17 1040  NA  --  133*  K  --  4.5  CL  --  96*  CO2  --  26  GLUCOSE  --  117*  BUN  --  17  CREATININE 1.35* 1.29*  CALCIUM  --  8.7*    PT/INR: No results for input(s): LABPROT, INR in the last 72 hours. ABG    Component Value Date/Time   PHART 7.437 01/28/2008 0358   HCO3 24.8 (H) 01/28/2008 0358   TCO2 26 06/25/2016 2029   O2SAT 100.0 01/28/2008 0358   CBG (last 3)  No results for input(s): GLUCAP in the last 72 hours.  Assessment/Plan: S/P Procedure(s) (LRB): VIDEO ASSISTED THORACOSCOPY (VATS)/DRAIN EMPYEMA (Left) Recommend transition to IV antibiotics now patient is rehospitalized Plan left VATS with drainage of empyema Friday February 8. Procedure discussed with patient and  family.   LOS: 1 day    Kathlee Nations Trigt III 11/24/2017

## 2017-11-24 NOTE — Progress Notes (Signed)
Pharmacy Antibiotic Note  Pedro Owens is a 27 y.o. 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  Pharmacy has been consulted for vancomycin and ceftriaxone dosing.  Patient was recently hospitalized and was on ceftriaxone and vancomycin during that stay- she was transitioned to Zyvox and Levquin PO for discharge due to history of IVDA. Patient was being established in Suboxone clinic, and during visit was SOB and reporting fevers and continued pain since discharge- CT of chest showed multiloculated pleural effusion and consolidation in LLL along with bilateral peripheral ground glass nodules suspected to be pulmonary emboli.  During previous hospitalization, a therapeutic vancomycin trough of 78mcg/mL was drawn on 2/1- patient was receiving vancomycin 1g IV q8h with that therapeutic trough, however SCr had increased to ~1.3 (up from 0.9), and vancomycin was reduced to 750mg  IV q8h. A repeat trough was not obtained on that dose with that renal function as patient was transitioned to PO antibiotics later on 2/1.  Plan: Ceftriaxone 2g IV q12h Vancomycin 1250mg  IV x1 to reload as she has not received vancomycin for ~5 days Vancomycin 750mg  IV q8h as maintenance dose Check trough at first steady state (2/7 @ 0900) Follow renal function very closely Follow clinical progression, ID plans  Height: 5\' 11"  (180.3 cm) Weight: 162 lb 7.7 oz (73.7 kg) IBW/kg (Calculated) : 75.3  Temp (24hrs), Avg:99.1 F (37.3 C), Min:98 F (36.7 C), Max:100.6 F (38.1 C)  Recent Labs  Lab 11/17/17 1244  11/19/17 0316 11/19/17 1321 11/20/17 0408 11/21/17 0725 11/22/17 0552 11/23/17 1040 11/24/17 0444  WBC  --   --  12.1*  --   --  13.1*  --  15.6* 13.1*  CREATININE  --    < > 1.28*  --  1.35* 1.30* 1.35* 1.29*  --   VANCOTROUGH 7*  --   --  19  --   --   --   --   --    < > = values in this interval not displayed.    Estimated Creatinine Clearance: 91.3 mL/min (A) (by  C-G formula based on SCr of 1.29 mg/dL (H)).    Allergies  Allergen Reactions  . Acetaminophen Diarrhea    Flares up crohns disease  . Other     IV CONTRAST  . Vicodin [Hydrocodone-Acetaminophen] Nausea And Vomiting  . Dilaudid [Hydromorphone Hcl] Hives and Rash    Antimicrobials this admission (and previous admission): Zyvox 2/1>(went home)>2/6 LVQ 2/1>(went home)>2/6 CTX 1/28>>2/1; 2/6>> Vanc 1/28>>2/1; 2/6>> *previous admit: 1/30 VT = 7 on 750mg  q8 (SCr 0.85); 2/1 VT = 19 on 1g q8 (SCr incr that AM to 1.28) >> dose decr to 750mg  q8, then vanc stopped later that day  Dose adjustments this admission: n/a  Microbiology results: 1/28 BCx: neg 1/30 BCx: neg  Thank you for allowing pharmacy to be a part of this patient's care.  Sohrab Keelan D. Dang Mathison, PharmD, BCPS Clinical Pharmacist Clinical Phone for 11/24/2017 until 3:30pm: [phone] If after 3:30pm, please call main pharmacy at [phone] 11/24/2017 8:53 AM

## 2017-11-25 ENCOUNTER — Inpatient Hospital Stay (HOSPITAL_COMMUNITY): Payer: BLUE CROSS/BLUE SHIELD

## 2017-11-25 DIAGNOSIS — Z9889 Other specified postprocedural states: Secondary | ICD-10-CM

## 2017-11-25 DIAGNOSIS — R0602 Shortness of breath: Secondary | ICD-10-CM

## 2017-11-25 LAB — TYPE AND SCREEN
ABO/RH(D): O POS
Antibody Screen: NEGATIVE

## 2017-11-25 LAB — CBC
HCT: 36.1 % — ABNORMAL LOW (ref 39.0–52.0)
HEMOGLOBIN: 12.4 g/dL — AB (ref 13.0–17.0)
MCH: 28.4 pg (ref 26.0–34.0)
MCHC: 34.3 g/dL (ref 30.0–36.0)
MCV: 82.6 fL (ref 78.0–100.0)
PLATELETS: 612 10*3/uL — AB (ref 150–400)
RBC: 4.37 MIL/uL (ref 4.22–5.81)
RDW: 12.7 % (ref 11.5–15.5)
WBC: 16.5 10*3/uL — ABNORMAL HIGH (ref 4.0–10.5)

## 2017-11-25 LAB — DIC (DISSEMINATED INTRAVASCULAR COAGULATION)PANEL
D-Dimer, Quant: 9.98 ug/mL-FEU — ABNORMAL HIGH (ref 0.00–0.50)
Fibrinogen: 585 mg/dL — ABNORMAL HIGH (ref 210–475)
INR: 1.16
Platelets: 606 10*3/uL — ABNORMAL HIGH (ref 150–400)
Prothrombin Time: 14.7 seconds (ref 11.4–15.2)
Smear Review: NONE SEEN
aPTT: 47 seconds — ABNORMAL HIGH (ref 24–36)

## 2017-11-25 LAB — COMPREHENSIVE METABOLIC PANEL
ALT: 25 U/L (ref 17–63)
AST: 33 U/L (ref 15–41)
Albumin: 2.5 g/dL — ABNORMAL LOW (ref 3.5–5.0)
Alkaline Phosphatase: 62 U/L (ref 38–126)
Anion gap: 14 (ref 5–15)
BUN: 15 mg/dL (ref 6–20)
CO2: 24 mmol/L (ref 22–32)
Calcium: 8.7 mg/dL — ABNORMAL LOW (ref 8.9–10.3)
Chloride: 96 mmol/L — ABNORMAL LOW (ref 101–111)
Creatinine, Ser: 1.24 mg/dL (ref 0.61–1.24)
GFR calc Af Amer: >60 mL/min (ref >60)
GFR calc non Af Amer: >60 mL/min (ref >60)
Glucose, Bld: 86 mg/dL (ref 65–99)
Potassium: 4.5 mmol/L (ref 3.5–5.1)
Sodium: 134 mmol/L — ABNORMAL LOW (ref 135–145)
Total Bilirubin: 0.4 mg/dL (ref 0.3–1.2)
Total Protein: 7.5 g/dL (ref 6.5–8.1)

## 2017-11-25 LAB — TSH: TSH: 3.047 u[IU]/mL (ref 0.350–4.500)

## 2017-11-25 LAB — BLOOD GAS, ARTERIAL
Acid-Base Excess: 3.1 mmol/L — ABNORMAL HIGH (ref 0.0–2.0)
Bicarbonate: 26.7 mmol/L (ref 20.0–28.0)
Drawn by: 52077
FIO2: 21
O2 Saturation: 94.8 %
Patient temperature: 98.6
pCO2 arterial: 38.3 mmHg (ref 32.0–48.0)
pH, Arterial: 7.458 — ABNORMAL HIGH (ref 7.350–7.450)
pO2, Arterial: 75.5 mmHg — ABNORMAL LOW (ref 83.0–108.0)

## 2017-11-25 LAB — ABO/RH: ABO/RH(D): O POS

## 2017-11-25 LAB — PREALBUMIN: Prealbumin: 6 mg/dL — ABNORMAL LOW (ref 18–38)

## 2017-11-25 LAB — APTT: aPTT: 45 seconds — ABNORMAL HIGH (ref 24–36)

## 2017-11-25 LAB — PROTIME-INR
INR: 1.15
Prothrombin Time: 14.6 seconds (ref 11.4–15.2)

## 2017-11-25 LAB — HEMOGLOBIN A1C
Hgb A1c MFr Bld: 5.8 % — ABNORMAL HIGH (ref 4.8–5.6)
Mean Plasma Glucose: 119.76 mg/dL

## 2017-11-25 MED ORDER — OXYCODONE HCL 5 MG PO TABS
10.0000 mg | ORAL_TABLET | ORAL | Status: DC | PRN
  Administered 2017-11-25 – 2017-11-26 (×2): 10 mg via ORAL
  Filled 2017-11-25 (×2): qty 2

## 2017-11-25 MED ORDER — DEXTROSE 5 % IV SOLN
1.5000 g | INTRAVENOUS | Status: AC
  Administered 2017-11-26: 1.5 g via INTRAVENOUS
  Filled 2017-11-25: qty 1.5

## 2017-11-25 MED ORDER — KETOROLAC TROMETHAMINE 15 MG/ML IJ SOLN
15.0000 mg | Freq: Once | INTRAMUSCULAR | Status: AC | PRN
  Administered 2017-11-25: 15 mg via INTRAVENOUS
  Filled 2017-11-25: qty 1

## 2017-11-25 NOTE — Progress Notes (Signed)
[facility]  Date of Admission:  11/23/2017     Total days of antibiotics 11         ASSESSMENT/PLAN  Appears to be resting more comfortably than yesterday. Scheduled to undergo VATS on 2/8 with CT Surgery.  She continues to be treated for culture negative endocarditis with levofloxacin and Linezolid and tolerating medications without adverse side effects. HIV regimen remains Triumeq and appears to be tolerating well.   1. Continue Linezolid and levofloxacin for endocarditis. Both of these medications have excellent bioavailability orally. Await VATS for culture to determine any additional organisms or need for antibiotic change.  2. Continue Triumeq for HIV disease.    Active Problems:   Loculated pleural effusion   Septic embolism (HCC)   Transgender   . abacavir-dolutegravir-lamiVUDine  1 tablet Oral Daily  . buprenorphine-naloxone  1 tablet Sublingual BID  . levofloxacin  750 mg Oral Daily  . linezolid  600 mg Oral Q12H  . nicotine  14 mg Transdermal Daily  . ramelteon  8 mg Oral QHS    SUBJECTIVE:  Interval History:  Afebrile overnight. She is resting in bed and drowsy. Continues to have leukocytosis with WBC increasing from 13.1-16.1.    Allergies  Allergen Reactions  . Acetaminophen Diarrhea    Flares up crohns disease  . Other     IV CONTRAST  . Vicodin [Hydrocodone-Acetaminophen] Nausea And Vomiting  . Dilaudid [Hydromorphone Hcl] Hives and Rash     Review of Systems: Review of Systems  Constitutional: Negative for chills and fever.  Respiratory: Positive for cough. Negative for shortness of breath and wheezing.   Cardiovascular: Negative for leg swelling.  Skin: Negative for rash.  Neurological: Negative for weakness.      OBJECTIVE: Vitals:   11/24/17 0534 11/24/17 1456 11/24/17 2123 11/25/17 0536  BP: 121/86 (!) 152/123 106/64 110/76  Pulse: 96 69 85 89  Resp: 18 18 17 18   Temp: 98.8 F (37.1 C) 98.7 F (37.1  C) 98.8 F (37.1 C) 99.5 F (37.5 C)  TempSrc: Oral Oral Oral Oral  SpO2: 99% 97% 97% 96%  Weight:      Height:       Body mass index is 22.66 kg/m.  Physical Exam  Constitutional: He is oriented to person, place, and time. He appears cachectic. He has a sickly appearance. No distress.  Lying in bed, drowsy.   Neck: Neck supple.  Cardiovascular: Normal rate, normal heart sounds and intact distal pulses. Exam reveals no gallop and no friction rub.  No murmur heard. Pulmonary/Chest: Effort normal. No respiratory distress. He has decreased breath sounds. He has no wheezes. He has no rales. He exhibits no tenderness.  Abdominal: Soft. Bowel sounds are normal. He exhibits no distension.  Lymphadenopathy:    He has no cervical adenopathy.  Neurological: He is oriented to person, place, and time.  Skin: Skin is warm and dry. No rash noted.    Lab Results Lab Results  Component Value Date   WBC 16.5 (H) 11/25/2017   HGB 12.4 (L) 11/25/2017   HCT 36.1 (L) 11/25/2017   MCV 82.6 11/25/2017   PLT 612 (H) 11/25/2017   PLT 606 (H) 11/25/2017    Lab Results  Component Value Date   CREATININE 1.24 11/25/2017   BUN 15 11/25/2017   NA 134 (L) 11/25/2017   K 4.5 11/25/2017   CL 96 (L) 11/25/2017   CO2 24 11/25/2017    Lab Results  Component  Value Date   ALT 25 11/25/2017   AST 33 11/25/2017   ALKPHOS 62 11/25/2017   BILITOT 0.4 11/25/2017     Microbiology: Recent Results (from the past 240 hour(s))  Blood Culture (routine x 2)     Status: None   Collection Time: 11/15/17 12:15 PM  Result Value Ref Range Status   Specimen Description BLOOD RIGHT ANTECUBITAL  Final   Special Requests   Final    BOTTLES DRAWN AEROBIC AND ANAEROBIC Blood Culture adequate volume   Culture   Final    NO GROWTH 5 DAYS Performed at [facility], [address], [place], [place] [address]    Report Status 11/20/2017 FINAL  Final  Blood Culture (routine x 2)     Status: None   Collection  Time: 11/15/17 12:50 PM  Result Value Ref Range Status   Specimen Description BLOOD LEFT HAND  Final   Special Requests   Final    BOTTLES DRAWN AEROBIC AND ANAEROBIC Blood Culture results may not be optimal due to an inadequate volume of blood received in culture bottles   Culture   Final    NO GROWTH 5 DAYS Performed at [facility], [address], [place], [place] [address]    Report Status 11/20/2017 FINAL  Final  Culture, blood (routine x 2)     Status: None   Collection Time: 11/17/17  9:05 AM  Result Value Ref Range Status   Specimen Description BLOOD LEFT ARM  Final   Special Requests IN PEDIATRIC BOTTLE Blood Culture adequate volume  Final   Culture   Final    NO GROWTH 5 DAYS Performed at [facility], [address] [place], [place] [address]    Report Status 11/22/2017 FINAL  Final  Culture, blood (routine x 2)     Status: None   Collection Time: 11/17/17  9:10 AM  Result Value Ref Range Status   Specimen Description BLOOD LEFT ANTECUBITAL  Final   Special Requests IN PEDIATRIC BOTTLE Blood Culture adequate volume  Final   Culture   Final    NO GROWTH 5 DAYS Performed at [facility], [address], [place], [place] [address]    Report Status 11/22/2017 FINAL  Final  Surgical pcr screen     Status: None   Collection Time: 11/24/17  4:08 PM  Result Value Ref Range Status   MRSA, PCR NEGATIVE NEGATIVE Final   Staphylococcus aureus NEGATIVE NEGATIVE Final    Comment: (NOTE) The Xpert SA Assay (FDA approved for NASAL specimens in patients 67 years of age and older), is one component of a comprehensive surveillance program. It is not intended to diagnose infection nor to guide or monitor treatment. Performed at [facility], [address] [place], [place] [address]      Marcos Eke, NP [facility] [phone] Pager  11/25/2017  9:54 AM

## 2017-11-25 NOTE — Progress Notes (Signed)
Subjective: No acute events overnight, she reports stable pain with no significant improvement or worsening. She continues to express frustration with her clinical course and various aspects of her care.   The difficult situation of pain control due to suboxone and plan to transition to short acting opiates for post-op pain control was explained.   Objective:  Vital signs in last 24 hours: Vitals:   11/24/17 0534 11/24/17 1456 11/24/17 2123 11/25/17 0536  BP: 121/86 (!) 152/123 106/64 110/76  Pulse: 96 69 85 89  Resp: 18 18 17 18   Temp: 98.8 F (37.1 C) 98.7 F (37.1 C) 98.8 F (37.1 C) 99.5 F (37.5 C)  TempSrc: Oral Oral Oral Oral  SpO2: 99% 97% 97% 96%  Weight:      Height:       General: Resting in bed, no acute distress HEENT: PERRL, moist mucus membranes  CV: RRR, no murmur appreciated  Resp: Decreased breath sounds and dullness to percussion on L, no respiratory distress  Abd: Soft, +BS, non-tender  Extr: No LE edema  Neuro: Alert and oriented x3  Skin: Warm, dry      Assessment/Plan:  Loculated Pleural Effusion Pt presented with persistent chest wall pain and dyspnea, febrile with increased WBC. Imaging demonstrated interval development of a multi-loculated pleural effusion c/w likely empyema. CT surgery consulted, appreciate their assistance. Leukocytosis has improved and remains stable on room air.  --Monitor vital signs, supplemental O2 prn  --CT surgery planning VATS draining 2/8, NPO @ midnight prior   --CBC  Pain Control: Because the pt was transitioned to suboxone for moderate pain and OUD previously, post-operative pain control will be difficult due to buprenorphine binding affinity. Have DC'ed the suboxone though she did receive am dose. Fentanyl and Dilaudid will be most likely to provide pain relief as buprenorphine is being cleared, though she may require higher initial doses than would be otherwise anticipated with careful titration down. IV doses may  be required which would be ok in the short term if pt is agreeable.    --DC Suboxone --Cont Voltaren gel  --Toradol dose today if needed (feel unlikely to be causative agent of prior AKI with contrast and vanc also given)  --Fentanyl or dilaudid post-operatively   Culture Negative Endocarditis Recently discharged with PO antibiotic regimen of Linezolid BID and Levofloxacin for the treatment of culture negative endocarditis in the setting of IV drug use history, plan for course to extend to 3/1. With ID assistance, will continue this regimen which provides adequate empiric coverage. Will follow labs and cultures from empyema that would allow for more targeted anti-biotics.   --Cont Linezolid 600 mg BID, Levofloxacin 750 mg daily   H/o HIV  Previously uncontrolled HIV, now on Triumeq. CD4 380, viral load 1,240. Health department notified due to pt withholding status from partner. Has requested this not be discussed while others in room.  --Cont triumeq    H/o Polysubstance Abuse Pt has a long history of polysubstance use with IV use of heroin, cocaine, and [name]. Has expressed a desire to cease her drug use and was started on suboxone for both analgesia and OUD. Will manage peri-operative pain as above and plan to transition back to suboxone for long term OUD management.   Recent Acute Kidney Injury Baseline Cr around 0.9 which increased to 1.35 felt to be due to exposure of potential nephrotoxins including contrast for CTA, vancomycin and toradol. These agents were discontinued, however her renal function has remained fairly stable, most  recently 1.24. Will continue to monitor. --BMP --Avoid nephrotoxins   Dispo: Anticipated discharge pending management of empyema.    Ginger Carne, MD 11/25/2017, 7:35 AM Pager: [phone]

## 2017-11-26 ENCOUNTER — Inpatient Hospital Stay (HOSPITAL_COMMUNITY): Payer: BLUE CROSS/BLUE SHIELD | Admitting: Certified Registered"

## 2017-11-26 ENCOUNTER — Encounter (HOSPITAL_COMMUNITY): Payer: Self-pay | Admitting: *Deleted

## 2017-11-26 ENCOUNTER — Encounter (HOSPITAL_COMMUNITY)
Admission: AD | Disposition: A | Discharge status: Home / Self Care | Payer: Self-pay | Source: Ambulatory Visit | Attending: Oncology

## 2017-11-26 ENCOUNTER — Inpatient Hospital Stay (HOSPITAL_COMMUNITY): Payer: BLUE CROSS/BLUE SHIELD

## 2017-11-26 DIAGNOSIS — L8990 Pressure ulcer of unspecified site, unspecified stage: Secondary | ICD-10-CM

## 2017-11-26 DIAGNOSIS — J869 Pyothorax without fistula: Secondary | ICD-10-CM

## 2017-11-26 HISTORY — PX: VIDEO ASSISTED THORACOSCOPY (VATS)/EMPYEMA: SHX6172

## 2017-11-26 LAB — BASIC METABOLIC PANEL
Anion gap: 14 (ref 5–15)
BUN: 16 mg/dL (ref 6–20)
CHLORIDE: 98 mmol/L — AB (ref 101–111)
CO2: 22 mmol/L (ref 22–32)
Calcium: 8.8 mg/dL — ABNORMAL LOW (ref 8.9–10.3)
Creatinine, Ser: 1.25 mg/dL — ABNORMAL HIGH (ref 0.61–1.24)
GFR calc Af Amer: >60 mL/min (ref >60)
GFR calc non Af Amer: >60 mL/min (ref >60)
GLUCOSE: 88 mg/dL (ref 65–99)
POTASSIUM: 4.6 mmol/L (ref 3.5–5.1)
Sodium: 134 mmol/L — ABNORMAL LOW (ref 135–145)

## 2017-11-26 LAB — CBC
HCT: 36.9 % — ABNORMAL LOW (ref 39.0–52.0)
Hemoglobin: 12.5 g/dL — ABNORMAL LOW (ref 13.0–17.0)
MCH: 28 pg (ref 26.0–34.0)
MCHC: 33.9 g/dL (ref 30.0–36.0)
MCV: 82.7 fL (ref 78.0–100.0)
Platelets: 565 10*3/uL — ABNORMAL HIGH (ref 150–400)
RBC: 4.46 MIL/uL (ref 4.22–5.81)
RDW: 12.8 % (ref 11.5–15.5)
WBC: 16.7 10*3/uL — ABNORMAL HIGH (ref 4.0–10.5)

## 2017-11-26 LAB — GLUCOSE, CAPILLARY: Glucose-Capillary: 103 mg/dL — ABNORMAL HIGH (ref 65–99)

## 2017-11-26 SURGERY — VIDEO ASSISTED THORACOSCOPY (VATS)/EMPYEMA
Anesthesia: General | Site: Chest | Laterality: Left

## 2017-11-26 MED ORDER — MORPHINE SULFATE (PF) 4 MG/ML IV SOLN
INTRAVENOUS | Status: AC
  Administered 2017-11-26: 2 mg via INTRAVENOUS
  Filled 2017-11-26: qty 1

## 2017-11-26 MED ORDER — PIPERACILLIN-TAZOBACTAM 3.375 G IVPB
3.3750 g | Freq: Three times a day (TID) | INTRAVENOUS | Status: DC
  Filled 2017-11-26 (×2): qty 50

## 2017-11-26 MED ORDER — LUNG SURGERY BOOK
Freq: Once | Status: AC
  Administered 2017-11-26: 22:00:00
  Filled 2017-11-26: qty 1

## 2017-11-26 MED ORDER — KETAMINE HCL 10 MG/ML IJ SOLN
INTRAMUSCULAR | Status: DC | PRN
  Administered 2017-11-26: 10 mg via INTRAVENOUS
  Administered 2017-11-26: 50 mg via INTRAVENOUS
  Administered 2017-11-26: 10 mg via INTRAVENOUS

## 2017-11-26 MED ORDER — CHLORHEXIDINE GLUCONATE CLOTH 2 % EX PADS
6.0000 | MEDICATED_PAD | Freq: Every day | CUTANEOUS | Status: DC
  Administered 2017-11-27 – 2017-11-29 (×3): 6 via TOPICAL

## 2017-11-26 MED ORDER — PIPERACILLIN-TAZOBACTAM 3.375 G IVPB
3.3750 g | Freq: Three times a day (TID) | INTRAVENOUS | Status: DC
  Administered 2017-11-26 – 2017-11-29 (×9): 3.375 g via INTRAVENOUS
  Filled 2017-11-26 (×10): qty 50

## 2017-11-26 MED ORDER — SUGAMMADEX SODIUM 200 MG/2ML IV SOLN
INTRAVENOUS | Status: AC
  Filled 2017-11-26: qty 2

## 2017-11-26 MED ORDER — LIDOCAINE HCL (CARDIAC) 20 MG/ML IV SOLN
INTRAVENOUS | Status: DC | PRN
  Administered 2017-11-26: 50 mg via INTRAVENOUS

## 2017-11-26 MED ORDER — ONDANSETRON HCL 4 MG/2ML IJ SOLN
INTRAMUSCULAR | Status: DC | PRN
  Administered 2017-11-26: 4 mg via INTRAVENOUS

## 2017-11-26 MED ORDER — PROMETHAZINE HCL 25 MG/ML IJ SOLN: 6.2500 mg | INTRAMUSCULAR | Status: DC | PRN

## 2017-11-26 MED ORDER — MIDAZOLAM HCL 2 MG/2ML IJ SOLN
INTRAMUSCULAR | Status: AC
  Administered 2017-11-26: 2 mg via INTRAVENOUS
  Filled 2017-11-26: qty 2

## 2017-11-26 MED ORDER — SENNOSIDES-DOCUSATE SODIUM 8.6-50 MG PO TABS
1.0000 | ORAL_TABLET | Freq: Every day | ORAL | Status: DC
  Administered 2017-11-26 – 2017-11-30 (×5): 1 via ORAL
  Filled 2017-11-26 (×6): qty 1

## 2017-11-26 MED ORDER — FENTANYL CITRATE (PF) 250 MCG/5ML IJ SOLN
INTRAMUSCULAR | Status: AC
  Filled 2017-11-26: qty 5

## 2017-11-26 MED ORDER — ROCURONIUM BROMIDE 10 MG/ML (PF) SYRINGE
PREFILLED_SYRINGE | INTRAVENOUS | Status: AC
  Filled 2017-11-26: qty 5

## 2017-11-26 MED ORDER — FENTANYL CITRATE (PF) 100 MCG/2ML IJ SOLN
50.0000 ug | Freq: Once | INTRAMUSCULAR | Status: AC
Start: 2017-11-26 — End: 2017-11-26
  Administered 2017-11-26: 50 ug via INTRAVENOUS

## 2017-11-26 MED ORDER — SODIUM CHLORIDE 0.9% FLUSH
10.0000 mL | Freq: Two times a day (BID) | INTRAVENOUS | Status: DC
  Administered 2017-11-26 – 2017-11-29 (×6): 10 mL

## 2017-11-26 MED ORDER — MEPERIDINE HCL 50 MG/ML IJ SOLN: 6.2500 mg | INTRAMUSCULAR | Status: DC | PRN

## 2017-11-26 MED ORDER — PROPOFOL 10 MG/ML IV BOLUS
INTRAVENOUS | Status: AC
  Filled 2017-11-26: qty 20

## 2017-11-26 MED ORDER — ROCURONIUM BROMIDE 10 MG/ML (PF) SYRINGE
PREFILLED_SYRINGE | INTRAVENOUS | Status: AC
  Filled 2017-11-26: qty 10

## 2017-11-26 MED ORDER — LIDOCAINE 2% (20 MG/ML) 5 ML SYRINGE
INTRAMUSCULAR | Status: AC
  Filled 2017-11-26: qty 5

## 2017-11-26 MED ORDER — PROPOFOL 10 MG/ML IV BOLUS
INTRAVENOUS | Status: DC | PRN
  Administered 2017-11-26: 180 mg via INTRAVENOUS

## 2017-11-26 MED ORDER — BISACODYL 5 MG PO TBEC
10.0000 mg | DELAYED_RELEASE_TABLET | Freq: Every day | ORAL | Status: DC
  Administered 2017-11-28: 10 mg via ORAL
  Filled 2017-11-26 (×3): qty 2

## 2017-11-26 MED ORDER — ONDANSETRON HCL 4 MG/2ML IJ SOLN
INTRAMUSCULAR | Status: AC
  Filled 2017-11-26: qty 2

## 2017-11-26 MED ORDER — LACTATED RINGERS IV SOLN
INTRAVENOUS | Status: DC | PRN
  Administered 2017-11-26: 15:00:00 via INTRAVENOUS

## 2017-11-26 MED ORDER — ORAL CARE MOUTH RINSE
15.0000 mL | Freq: Two times a day (BID) | OROMUCOSAL | Status: DC
Start: 2017-11-26 — End: 2017-12-01
  Administered 2017-11-26 – 2017-11-30 (×6): 15 mL via OROMUCOSAL

## 2017-11-26 MED ORDER — KETOROLAC TROMETHAMINE 30 MG/ML IJ SOLN
INTRAMUSCULAR | Status: AC
  Filled 2017-11-26: qty 1

## 2017-11-26 MED ORDER — INSULIN ASPART 100 UNIT/ML ~~LOC~~ SOLN: 0.0000 [IU] | Freq: Four times a day (QID) | SUBCUTANEOUS | Status: DC

## 2017-11-26 MED ORDER — MIDAZOLAM HCL 2 MG/2ML IJ SOLN
0.5000 mg | Freq: Once | INTRAMUSCULAR | Status: AC | PRN
  Administered 2017-11-26: 1 mg via INTRAVENOUS

## 2017-11-26 MED ORDER — FENTANYL 40 MCG/ML IV SOLN
INTRAVENOUS | Status: DC
  Administered 2017-11-26: 1000 ug via INTRAVENOUS
  Administered 2017-11-26: 30 ug via INTRAVENOUS
  Administered 2017-11-27: 360 ug via INTRAVENOUS
  Administered 2017-11-27: 1000 ug via INTRAVENOUS
  Administered 2017-11-27: 270 ug via INTRAVENOUS
  Administered 2017-11-27: 195 ug via INTRAVENOUS
  Administered 2017-11-28: 315 ug via INTRAVENOUS
  Administered 2017-11-28: 270 ug via INTRAVENOUS
  Filled 2017-11-26 (×7): qty 25

## 2017-11-26 MED ORDER — MIDAZOLAM HCL 2 MG/2ML IJ SOLN
INTRAMUSCULAR | Status: AC
  Filled 2017-11-26: qty 2

## 2017-11-26 MED ORDER — MIDAZOLAM HCL 2 MG/2ML IJ SOLN
2.0000 mg | Freq: Once | INTRAMUSCULAR | Status: AC
  Administered 2017-11-26: 2 mg via INTRAVENOUS

## 2017-11-26 MED ORDER — SODIUM CHLORIDE 0.9% FLUSH: 10.0000 mL | INTRAVENOUS | Status: DC | PRN

## 2017-11-26 MED ORDER — FENTANYL CITRATE (PF) 100 MCG/2ML IJ SOLN
INTRAMUSCULAR | Status: AC
  Administered 2017-11-26: 50 ug via INTRAVENOUS
  Filled 2017-11-26: qty 2

## 2017-11-26 MED ORDER — TRAMADOL HCL 50 MG PO TABS
50.0000 mg | ORAL_TABLET | Freq: Four times a day (QID) | ORAL | Status: DC | PRN
  Administered 2017-11-28: 50 mg via ORAL
  Administered 2017-11-29 – 2017-12-01 (×5): 100 mg via ORAL
  Filled 2017-11-26: qty 1
  Filled 2017-11-26 (×6): qty 2

## 2017-11-26 MED ORDER — SODIUM CHLORIDE 0.9% FLUSH: 9.0000 mL | INTRAVENOUS | Status: DC | PRN

## 2017-11-26 MED ORDER — ACETAMINOPHEN 500 MG PO TABS: 1000.0000 mg | ORAL_TABLET | Freq: Four times a day (QID) | ORAL | Status: DC

## 2017-11-26 MED ORDER — FENTANYL CITRATE (PF) 100 MCG/2ML IJ SOLN
50.0000 ug | Freq: Once | INTRAMUSCULAR | Status: AC
  Administered 2017-11-26: 50 ug via INTRAVENOUS

## 2017-11-26 MED ORDER — NALOXONE HCL 0.4 MG/ML IJ SOLN: 0.4000 mg | INTRAMUSCULAR | Status: DC | PRN

## 2017-11-26 MED ORDER — KETOROLAC TROMETHAMINE 30 MG/ML IJ SOLN
INTRAMUSCULAR | Status: DC | PRN
  Administered 2017-11-26: 30 mg via INTRAVENOUS

## 2017-11-26 MED ORDER — SUGAMMADEX SODIUM 200 MG/2ML IV SOLN
INTRAVENOUS | Status: DC | PRN
  Administered 2017-11-26: 147.4 mg via INTRAVENOUS

## 2017-11-26 MED ORDER — POTASSIUM CHLORIDE 10 MEQ/50ML IV SOLN
10.0000 meq | Freq: Every day | INTRAVENOUS | Status: DC | PRN
  Filled 2017-11-26: qty 50

## 2017-11-26 MED ORDER — DIPHENHYDRAMINE HCL 50 MG/ML IJ SOLN: 12.5000 mg | Freq: Four times a day (QID) | INTRAMUSCULAR | Status: DC | PRN

## 2017-11-26 MED ORDER — ONDANSETRON HCL 4 MG/2ML IJ SOLN
4.0000 mg | Freq: Four times a day (QID) | INTRAMUSCULAR | Status: DC | PRN
  Administered 2017-11-30 – 2017-12-01 (×2): 4 mg via INTRAVENOUS
  Filled 2017-11-26 (×2): qty 2

## 2017-11-26 MED ORDER — LACTATED RINGERS IV SOLN
INTRAVENOUS | Status: DC
  Administered 2017-11-26: 12:00:00 via INTRAVENOUS

## 2017-11-26 MED ORDER — NALOXONE HCL 0.4 MG/ML IJ SOLN
0.4000 mg | INTRAMUSCULAR | Status: DC | PRN
  Filled 2017-11-26: qty 3

## 2017-11-26 MED ORDER — DIPHENHYDRAMINE HCL 12.5 MG/5ML PO ELIX: 12.5000 mg | ORAL_SOLUTION | Freq: Four times a day (QID) | ORAL | Status: DC | PRN

## 2017-11-26 MED ORDER — MORPHINE SULFATE (PF) 4 MG/ML IV SOLN
1.0000 mg | INTRAVENOUS | Status: DC | PRN
  Administered 2017-11-26 (×3): 2 mg via INTRAVENOUS

## 2017-11-26 MED ORDER — PANTOPRAZOLE SODIUM 40 MG PO TBEC
40.0000 mg | DELAYED_RELEASE_TABLET | Freq: Every day | ORAL | Status: DC
  Administered 2017-11-27 – 2017-12-01 (×5): 40 mg via ORAL
  Filled 2017-11-26 (×5): qty 1

## 2017-11-26 MED ORDER — ACETAMINOPHEN 160 MG/5ML PO SOLN: 1000.0000 mg | Freq: Four times a day (QID) | ORAL | Status: DC

## 2017-11-26 MED ORDER — MIDAZOLAM HCL 5 MG/5ML IJ SOLN
INTRAMUSCULAR | Status: DC | PRN
  Administered 2017-11-26: 2 mg via INTRAVENOUS

## 2017-11-26 MED ORDER — MIDAZOLAM HCL 2 MG/2ML IJ SOLN
INTRAMUSCULAR | Status: AC
  Administered 2017-11-26: 1 mg via INTRAVENOUS
  Filled 2017-11-26: qty 2

## 2017-11-26 MED ORDER — ROCURONIUM BROMIDE 100 MG/10ML IV SOLN
INTRAVENOUS | Status: DC | PRN
  Administered 2017-11-26: 50 mg via INTRAVENOUS

## 2017-11-26 MED ORDER — ONDANSETRON HCL 4 MG/2ML IJ SOLN: 4.0000 mg | Freq: Four times a day (QID) | INTRAMUSCULAR | Status: DC | PRN

## 2017-11-26 MED ORDER — SODIUM CHLORIDE 0.9 % IJ SOLN
INTRAMUSCULAR | Status: AC
  Filled 2017-11-26: qty 10

## 2017-11-26 MED ORDER — DEXTROSE-NACL 5-0.9 % IV SOLN
INTRAVENOUS | Status: DC
  Administered 2017-11-26 – 2017-11-27 (×2): via INTRAVENOUS

## 2017-11-26 MED ORDER — FENTANYL CITRATE (PF) 100 MCG/2ML IJ SOLN
INTRAMUSCULAR | Status: DC | PRN
  Administered 2017-11-26: 250 ug via INTRAVENOUS

## 2017-11-26 MED ORDER — KETAMINE HCL-SODIUM CHLORIDE 100-0.9 MG/10ML-% IV SOSY
PREFILLED_SYRINGE | INTRAVENOUS | Status: AC
  Filled 2017-11-26: qty 10

## 2017-11-26 MED ORDER — DEXAMETHASONE SODIUM PHOSPHATE 10 MG/ML IJ SOLN
INTRAMUSCULAR | Status: AC
  Filled 2017-11-26: qty 1

## 2017-11-26 MED ORDER — ALBUTEROL SULFATE HFA 108 (90 BASE) MCG/ACT IN AERS
INHALATION_SPRAY | RESPIRATORY_TRACT | Status: AC
  Filled 2017-11-26: qty 6.7

## 2017-11-26 MED ORDER — 0.9 % SODIUM CHLORIDE (POUR BTL) OPTIME
TOPICAL | Status: DC | PRN
  Administered 2017-11-26: 1000 mL

## 2017-11-26 SURGICAL SUPPLY — 68 items
ADH SKN CLS APL DERMABOND .7 (GAUZE/BANDAGES/DRESSINGS) ×1
BAG DECANTER FOR FLEXI CONT (MISCELLANEOUS) IMPLANT
BLADE SURG 11 STRL SS (BLADE) ×3 IMPLANT
CANISTER SUCT 3000ML PPV (MISCELLANEOUS) ×3 IMPLANT
CATH KIT ON Q 5IN SLV (PAIN MANAGEMENT) IMPLANT
CATH ROBINSON RED A/P 22FR (CATHETERS) IMPLANT
CATH THORACIC 28FR (CATHETERS) IMPLANT
CATH THORACIC 36FR (CATHETERS) IMPLANT
CATH THORACIC 36FR RT ANG (CATHETERS) IMPLANT
CONN ST 1/4X3/8  BEN (MISCELLANEOUS) ×2
CONN ST 1/4X3/8 BEN (MISCELLANEOUS) IMPLANT
CONT SPEC 4OZ CLIKSEAL STRL BL (MISCELLANEOUS) ×10 IMPLANT
DERMABOND ADVANCED (GAUZE/BANDAGES/DRESSINGS) ×2
DERMABOND ADVANCED .7 DNX12 (GAUZE/BANDAGES/DRESSINGS) IMPLANT
DRAPE LAPAROSCOPIC ABDOMINAL (DRAPES) ×3 IMPLANT
DRAPE WARM FLUID 44X44 (DRAPE) ×3 IMPLANT
ELECT BLADE 4.0 EZ CLEAN MEGAD (MISCELLANEOUS) ×3
ELECT REM PT RETURN 9FT ADLT (ELECTROSURGICAL) ×3
ELECTRODE BLDE 4.0 EZ CLN MEGD (MISCELLANEOUS) IMPLANT
ELECTRODE REM PT RTRN 9FT ADLT (ELECTROSURGICAL) ×1 IMPLANT
GAUZE SPONGE 4X4 12PLY STRL (GAUZE/BANDAGES/DRESSINGS) ×3 IMPLANT
GAUZE SPONGE 4X4 12PLY STRL LF (GAUZE/BANDAGES/DRESSINGS) ×2 IMPLANT
GLOVE BIO SURGEON STRL SZ 6 (GLOVE) ×6 IMPLANT
GLOVE BIO SURGEON STRL SZ7 (GLOVE) ×2 IMPLANT
GLOVE BIO SURGEON STRL SZ7.5 (GLOVE) ×6 IMPLANT
GLOVE BIO SURGEON STRL SZ8 (GLOVE) ×2 IMPLANT
GLOVE BIOGEL PI IND STRL 6 (GLOVE) IMPLANT
GLOVE BIOGEL PI INDICATOR 6 (GLOVE) ×8
GOWN STRL REUS W/ TWL LRG LVL3 (GOWN DISPOSABLE) ×3 IMPLANT
GOWN STRL REUS W/TWL LRG LVL3 (GOWN DISPOSABLE) ×9
KIT BASIN OR (CUSTOM PROCEDURE TRAY) ×3 IMPLANT
KIT ROOM TURNOVER OR (KITS) ×3 IMPLANT
KIT SUCTION CATH 14FR (SUCTIONS) ×5 IMPLANT
NS IRRIG 1000ML POUR BTL (IV SOLUTION) ×12 IMPLANT
PACK CHEST (CUSTOM PROCEDURE TRAY) ×3 IMPLANT
PAD ARMBOARD 7.5X6 YLW CONV (MISCELLANEOUS) ×6 IMPLANT
SEALANT SURG COSEAL 4ML (VASCULAR PRODUCTS) IMPLANT
SOLUTION ANTI FOG 6CC (MISCELLANEOUS) ×5 IMPLANT
SPONGE TONSIL 1.25 RF SGL STRG (GAUZE/BANDAGES/DRESSINGS) ×5 IMPLANT
SUT CHROMIC 3 0 SH 27 (SUTURE) IMPLANT
SUT ETHILON 3 0 PS 1 (SUTURE) IMPLANT
SUT PROLENE 3 0 SH DA (SUTURE) IMPLANT
SUT PROLENE 4 0 RB 1 (SUTURE)
SUT PROLENE 4-0 RB1 .5 CRCL 36 (SUTURE) IMPLANT
SUT PROLENE 6 0 C 1 30 (SUTURE) IMPLANT
SUT SILK  1 MH (SUTURE) ×6
SUT SILK 1 MH (SUTURE) ×2 IMPLANT
SUT SILK 1 TIES 10X30 (SUTURE) IMPLANT
SUT SILK 2 0SH CR/8 30 (SUTURE) IMPLANT
SUT SILK 3 0SH CR/8 30 (SUTURE) IMPLANT
SUT VIC AB 1 CTX 18 (SUTURE) ×4 IMPLANT
SUT VIC AB 2 TP1 27 (SUTURE) ×2 IMPLANT
SUT VIC AB 2-0 CT2 18 VCP726D (SUTURE) IMPLANT
SUT VIC AB 2-0 CTX 36 (SUTURE) ×2 IMPLANT
SUT VIC AB 3-0 SH 18 (SUTURE) ×2 IMPLANT
SUT VIC AB 3-0 X1 27 (SUTURE) ×2 IMPLANT
SUT VICRYL 0 UR6 27IN ABS (SUTURE) IMPLANT
SUT VICRYL 2 TP 1 (SUTURE) IMPLANT
SWAB COLLECTION DEVICE MRSA (MISCELLANEOUS) IMPLANT
SWAB CULTURE ESWAB REG 1ML (MISCELLANEOUS) IMPLANT
SYSTEM SAHARA CHEST DRAIN ATS (WOUND CARE) ×3 IMPLANT
TAPE CLOTH SURG 6X10 WHT LF (GAUZE/BANDAGES/DRESSINGS) ×2 IMPLANT
TIP APPLICATOR SPRAY EXTEND 16 (VASCULAR PRODUCTS) IMPLANT
TOWEL GREEN STERILE (TOWEL DISPOSABLE) ×3 IMPLANT
TOWEL GREEN STERILE FF (TOWEL DISPOSABLE) ×3 IMPLANT
TRAP SPECIMEN MUCOUS 40CC (MISCELLANEOUS) ×2 IMPLANT
TRAY FOLEY W/METER SILVER 16FR (SET/KITS/TRAYS/PACK) ×3 IMPLANT
WATER STERILE IRR 1000ML POUR (IV SOLUTION) ×6 IMPLANT

## 2017-11-26 NOTE — Anesthesia Procedure Notes (Signed)
Central Venous Catheter Insertion Performed by: Annye Asa, MD, anesthesiologist Start/End2/05/2018 12:17 PM, 11/26/2017 12:30 PM Patient location: Pre-op. Preanesthetic checklist: patient identified, IV checked, risks and benefits discussed, surgical consent, monitors and equipment checked, pre-op evaluation, timeout performed and anesthesia consent Position: supine Lidocaine 1% used for infiltration and patient sedated Hand hygiene performed , maximum sterile barriers used  and Seldinger technique used Catheter size: 8 Fr Central line was placed.Double lumen Procedure performed using ultrasound guided technique. Ultrasound Notes:anatomy identified, needle tip was noted to be adjacent to the nerve/plexus identified, no ultrasound evidence of intravascular and/or intraneural injection and image(s) printed for medical record Attempts: 1 Following insertion, line sutured, dressing applied and Biopatch. Post procedure assessment: blood return through all ports, free fluid flow and no air  Patient tolerated the procedure well with no immediate complications. Additional procedure comments: CVP: Timeout, sterile prep, drape, FBP R neck.  Supine position.  1% lido local, finder and trocar LIJ 1st pass with US guidance.  2 lumen placed over J wire. Biopatch and sterile dressing on.  Patient tolerated well.  VSS.  Jenita Seashore, MD.

## 2017-11-26 NOTE — Anesthesia Preprocedure Evaluation (Signed)
Anesthesia Evaluation  Patient identified by MRN, date of birth, ID band Patient awake    Reviewed: Allergy & Precautions, NPO status , Patient's Chart, lab work & pertinent test results  History of Anesthesia Complications Negative for: history of anesthetic complications  Airway Mallampati: II  TM Distance: >3 FB Neck ROM: Full    Dental  (+) Dental Advisory Given   Pulmonary Current Smoker,  L empyema   breath sounds clear to auscultation       Cardiovascular (-) hypertensionnegative cardio ROS   Rhythm:Regular Rate:Normal     Neuro/Psych  Headaches, Anxiety Depression Bipolar Disorder    GI/Hepatic negative GI ROS, (+)     substance abuse  marijuana use and IV drug use, Hepatitis -, C  Endo/Other  negative endocrine ROS  Renal/GU negative Renal ROS     Musculoskeletal   Abdominal   Peds  Hematology  (+) HIV,   Anesthesia Other Findings   Reproductive/Obstetrics                             Anesthesia Physical Anesthesia Plan  ASA: III  Anesthesia Plan: General   Post-op Pain Management:    Induction: Intravenous  PONV Risk Score and Plan: 2 and Ondansetron  Airway Management Planned: Double Lumen EBT  Additional Equipment: Arterial line and CVP  Intra-op Plan:   Post-operative Plan: Extubation in OR  Informed Consent: I have reviewed the patients History and Physical, chart, labs and discussed the procedure including the risks, benefits and alternatives for the proposed anesthesia with the patient or authorized representative who has indicated his/her understanding and acceptance.   Dental advisory given  Plan Discussed with: CRNA and Surgeon  Anesthesia Plan Comments: (Plan routine monitors, A line, central line, GETA with DLT)        Anesthesia Quick Evaluation

## 2017-11-26 NOTE — Brief Op Note (Signed)
11/23/2017 - 11/26/2017  5:04 PM  PATIENT:  Pedro Owens  27 y.o. male  PRE-OPERATIVE DIAGNOSIS:  LEFT LOCULATED PLEURAL EFFUSION and EMPYEMA  POST-OPERATIVE DIAGNOSIS:  LEFT LOCULATED PLEURAL EFFUSION and EMPYEMA  PROCEDURE:  LEFT VIDEO ASSISTED THORACOSCOPY (VATS), DRAIN LEFT EMPYEMA   SURGEON:  Surgeon(s) and Role:    Kerin Perna, MD - Primary  PHYSICIAN ASSISTANT: Doree Fudge PA-C  ANESTHESIA:   general  EBL:  30 mL   BLOOD ADMINISTERED:none  DRAINS: 28 French chest tube and Blake drain placed in the left pleural space   SPECIMEN:  Source of Specimen:  Left pleural fluid and left pleural peel  DISPOSITION OF SPECIMEN:  Pathology and culture  COUNTS CORRECT:  YES  DICTATION: .Dragon Dictation  PLAN OF CARE: Admit to inpatient   PATIENT DISPOSITION:  PACU - hemodynamically stable.   Delay start of Pharmacological VTE agent (>24hrs) due to surgical blood loss or risk of bleeding: yes

## 2017-11-26 NOTE — Progress Notes (Signed)
   Subjective: No acute events overnight, chest wall pain remains stable compared to day prior. She was given oxycodone and toradol yesterday/overnight as suboxone discontinued. She has been NPO since midnight. More reserved this morning.     Objective:  Vital signs in last 24 hours: Vitals:   11/25/17 1341 11/25/17 2153 11/26/17 0538 11/26/17 1000  BP: 107/78 127/83 102/61   Pulse: 77 71 87   Resp: 17 18 18    Temp: 98.2 F (36.8 C) 98.9 F (37.2 C) 100.3 F (37.9 C) 98.1 F (36.7 C)  TempSrc: Oral Oral Oral Oral  SpO2: 96% 99% 100%   Weight:      Height:       General: Sitting up in bed, no acute distress HEENT: moist mucus membranes  CV: RRR, no murmur appreciated  Resp: Continued decreased breath sounds and dullness to percussion on L, no respiratory distress  Abd: Soft, +BS, non-tender  Extr: No LE edema  Neuro: Alert and oriented x3  Skin: Warm, dry     Assessment/Plan:  Loculated Pleural Effusion Pt presented with persistent chest wall pain and dyspnea, febrile with increased WBC. Imaging demonstrated interval development of a multi-loculated pleural effusion c/w likely empyema. CT surgery consulted, appreciate their assistance. Pt awaiting VATS this am.  --Monitor vital signs, supplemental O2 prn  --F/u VATS procedure and labs  --CBC  Pain Control: Because the pt was transitioned to suboxone for moderate pain and OUD previously, post-operative pain control will be difficult due to buprenorphine binding affinity. Suboxone DC'ed 2/7. Fentanyl and Dilaudid will be most likely to provide pain relief as buprenorphine is being cleared, though she may require higher initial doses than would be otherwise anticipated with careful titration down. PCA pump or IV will be most likely to be effective   --F/u post-op and titrate PCA or IV pain regimen accordingly   Culture Negative Endocarditis Recently discharged with PO antibiotic regimen of Linezolid BID and Levofloxacin for  the treatment of culture negative endocarditis in the setting of IV drug use history, plan for course to extend to 3/1. With ID assistance, will continue this regimen which provides adequate empiric coverage. Will follow labs and cultures from empyema that would allow for more targeted anti-biotics.   --Cont Linezolid 600 mg BID, Levofloxacin 750 mg daily   H/o HIV  Previously uncontrolled HIV, now on Triumeq. CD4 380, viral load 1,240. Health department notified due to pt withholding status from partner. Has requested this not be discussed while others in room.  --Cont triumeq    H/o Polysubstance Abuse Pt has a long history of polysubstance use with IV use of heroin, cocaine, and [name]. Has expressed a desire to cease her drug use and was started on suboxone for both analgesia and OUD. Will manage post-operative pain as above and plan to transition back to suboxone for long term OUD management.   Recent Acute Kidney Injury Baseline Cr around 0.9 which increased to 1.35 felt to be due to exposure of potential nephrotoxins including contrast for CTA, vancomycin and toradol. These agents were discontinued, however her renal function has remained fairly stable, most recently 1.25. Will continue to monitor. --BMP --Avoid nephrotoxins   Dispo: Anticipated discharge pending management of empyema.    Ginger Carne, MD 11/26/2017, 11:22 AM Pager: [phone]

## 2017-11-26 NOTE — Anesthesia Procedure Notes (Signed)
Procedure Name: Intubation Date/Time: 11/26/2017 3:12 PM Performed by: Eligha Bridegroom, CRNA Pre-anesthesia Checklist: Patient identified, Emergency Drugs available, Suction available, Patient being monitored and Timeout performed Patient Re-evaluated:Patient Re-evaluated prior to induction Oxygen Delivery Method: Circle system utilized Preoxygenation: Pre-oxygenation with 100% oxygen Induction Type: IV induction Ventilation: Mask ventilation without difficulty Laryngoscope Size: Mac and 4 Grade View: Grade II Endobronchial tube: Left and Double lumen EBT and 39 Fr Number of attempts: 1 Airway Equipment and Method: Stylet Placement Confirmation: ETT inserted through vocal cords under direct vision and breath sounds checked- equal and bilateral Tube secured with: Tape Dental Injury: Teeth and Oropharynx as per pre-operative assessment

## 2017-11-26 NOTE — Progress Notes (Signed)
Medicine attending: I examined this patient today together with resident physician Dr. Ginger Carne and I concur with his evaluation and management plan which will be subsequently documented in his progress note. We appreciate ongoing assistance from cardiovascular surgery.  Patient will be taken for a VATS procedure and drainage of empyema today. No acute clinical change.  Renal function stable with no further improvement.  [place] count and platelet count remain elevated not unexpectedly. Multiple family members present for support. Continue current management plan.  Any changes in therapy based on results from today's surgical procedure.

## 2017-11-26 NOTE — Progress Notes (Signed)
Pre Procedure note for inpatients:   Pedro Owens has been scheduled for Procedure(s): VIDEO ASSISTED THORACOSCOPY (VATS)/DRAIN EMPYEMA (Left) today. The various methods of treatment have been discussed with the patient. After consideration of the risks, benefits and treatment options the patient has consented to the planned procedure.   The patient has been seen and labs reviewed. There are no changes in the patient's condition to prevent proceeding with the planned procedure today.  Recent labs:  Lab Results  Component Value Date   WBC 16.5 (H) 11/25/2017   HGB 12.4 (L) 11/25/2017   HCT 36.1 (L) 11/25/2017   PLT 612 (H) 11/25/2017   PLT 606 (H) 11/25/2017   GLUCOSE 86 11/25/2017   CHOL 97 01/06/2017   TRIG 114 01/06/2017   HDL 39 (L) 01/06/2017   LDLCALC 35 01/06/2017   ALT 25 11/25/2017   AST 33 11/25/2017   NA 134 (L) 11/25/2017   K 4.5 11/25/2017   CL 96 (L) 11/25/2017   CREATININE 1.24 11/25/2017   BUN 15 11/25/2017   CO2 24 11/25/2017   TSH 3.047 11/25/2017   INR 1.15 11/25/2017   INR 1.16 11/25/2017   HGBA1C 5.8 (H) 11/25/2017    Mikey Bussing, MD 11/26/2017 7:31 AM

## 2017-11-26 NOTE — Progress Notes (Addendum)
Pharmacy Antibiotic Note  Pedro Owens is a 27 y.o. male admitted on 11/23/2017 with empyema s/p VATS. Starting vancomycin for surgical prophylaxis. SCr 1.25.   Plan: -Vancomycin 1500 mg IV x1 then 750 mg IV q8h -Monitor renal fx, cultures, VT as needed   Height: 5\' 11"  (180.3 cm) Weight: 162 lb 7.7 oz (73.7 kg) IBW/kg (Calculated) : 75.3  Temp (24hrs), Avg:98.3 F (36.8 C), Min:97.7 F (36.5 C), Max:100.3 F (37.9 C)  Recent Labs  Lab 11/21/17 0725 11/22/17 0552 11/23/17 1040 11/24/17 0444 11/25/17 0352 11/26/17 0633  WBC 13.1*  --  15.6* 13.1* 16.5* 16.7*  CREATININE 1.30* 1.35* 1.29*  --  1.24 1.25*    Estimated Creatinine Clearance: 94.2 mL/min (A) (by C-G formula based on SCr of 1.25 mg/dL (H)).    Allergies  Allergen Reactions  . Acetaminophen Diarrhea    Flares up crohns disease  . Other     IV CONTRAST  . Vicodin [Hydrocodone-Acetaminophen] Nausea And Vomiting  . Dilaudid [Hydromorphone Hcl] Hives and Rash    Zyvox 2/1>(went home)>> 2/9 LVQ 2/1>(went home)>> 2/9 CTX 1/28>>2/1; 2/6 Vanc 1/28>>2/1; 2/6 x1; 2/9 >> *previous admit: 1/30 VT = 7 on 750mg  q8 (SCr 0.85); 2/1 VT = 19 on 1g q8 (SCr incr that AM to 1.28) >> dose decr to 750mg  q8, then vanc stopped later that day Zosyn 2/9 >>  1/28 BCx: neg 1/30 BCx: neg  2/8 pleural fluid: ngtd 2/6 mrsa pcr: neg  Pedro Owens 11/26/2017 11:54 PM

## 2017-11-26 NOTE — Anesthesia Procedure Notes (Signed)
Arterial Line Insertion Start/End2/05/2018 12:15 PM, 11/26/2017 12:20 PM Performed by: Rachel Moulds, CRNA, CRNA  Patient location: Pre-op. Preanesthetic checklist: patient identified, IV checked, risks and benefits discussed, surgical consent, monitors and equipment checked, pre-op evaluation and timeout performed Lidocaine 1% used for infiltration and patient sedated Right, radial was placed Catheter size: 20 G Hand hygiene performed  and maximum sterile barriers used  Allen's test indicative of satisfactory collateral circulation Attempts: 1 Procedure performed without using ultrasound guided technique. Following insertion, Biopatch. Post procedure assessment: normal  Patient tolerated the procedure well with no immediate complications.

## 2017-11-26 NOTE — Anesthesia Postprocedure Evaluation (Signed)
Anesthesia Post Note  Patient: Pedro Owens  Procedure(s) Performed: VIDEO ASSISTED THORACOSCOPY (VATS)/DRAIN EMPYEMA (Left Chest)     Patient location during evaluation: PACU Anesthesia Type: General Level of consciousness: awake and alert Pain management: pain level controlled Vital Signs Assessment: post-procedure vital signs reviewed and stable Respiratory status: spontaneous breathing, nonlabored ventilation, respiratory function stable and patient connected to nasal cannula oxygen Cardiovascular status: blood pressure returned to baseline and stable Postop Assessment: no apparent nausea or vomiting Anesthetic complications: no    Last Vitals:  Vitals:   11/26/17 1808 11/26/17 1815  BP:  (!) 146/95  Pulse: 78 79  Resp: 20 16  Temp:    SpO2: 100% 100%    Last Pain:  Vitals:   11/26/17 1815  TempSrc:   PainSc: Asleep                 Anjenette Gerbino COKER

## 2017-11-26 NOTE — Progress Notes (Signed)
Patient ID: Pedro Owens, male   DOB: 06-17-1991, 27 y.o.   MRN: [id number] EVENING ROUNDS NOTE :     [address]       [place] [address]             [phone]                 Day of Surgery Procedure(s) (LRB): VIDEO ASSISTED THORACOSCOPY (VATS)/DRAIN EMPYEMA (Left)  Total Length of Stay:  LOS: 3 days  BP 135/82   Pulse 78   Temp 97.7 F (36.5 C) (Axillary)   Resp 20   Ht 5\' 11"  (1.803 m)   Wt 162 lb 7.7 oz (73.7 kg)   SpO2 98%   BMI 22.66 kg/m   .Intake/Output      02/08 0701 - 02/09 0700   P.O.    I.V. (mL/kg) 1921.7 (26.1)   Total Intake(mL/kg) 1921.7 (26.1)   Urine (mL/kg/hr) 565 (0.6)   Blood 30   Chest Tube 130   Total Output 725   Net +1196.7         . dextrose 5 % and 0.9% NaCl 100 mL/hr at 11/26/17 1909  . piperacillin-tazobactam (ZOSYN)  IV 3.375 g (11/26/17 1939)  . potassium chloride       Lab Results  Component Value Date   WBC 16.7 (H) 11/26/2017   HGB 12.5 (L) 11/26/2017   HCT 36.9 (L) 11/26/2017   PLT 565 (H) 11/26/2017   GLUCOSE 88 11/26/2017   CHOL 97 01/06/2017   TRIG 114 01/06/2017   HDL 39 (L) 01/06/2017   LDLCALC 35 01/06/2017   ALT 25 11/25/2017   AST 33 11/25/2017   NA 134 (L) 11/26/2017   K 4.6 11/26/2017   CL 98 (L) 11/26/2017   CREATININE 1.25 (H) 11/26/2017   BUN 16 11/26/2017   CO2 22 11/26/2017   TSH 3.047 11/25/2017   INR 1.15 11/25/2017   INR 1.16 11/25/2017   HGBA1C 5.8 (H) 11/25/2017   Stable post op In icu    Delight Ovens MD  Beeper [phone] Office [phone] 11/26/2017 8:51 PM

## 2017-11-26 NOTE — Transfer of Care (Signed)
Immediate Anesthesia Transfer of Care Note  Patient: Pedro Owens  Procedure(s) Performed: VIDEO ASSISTED THORACOSCOPY (VATS)/DRAIN EMPYEMA (Left Chest)  Patient Location: PACU  Anesthesia Type:General  Level of Consciousness: awake and alert   Airway & Oxygen Therapy: Patient Spontanous Breathing and Patient connected to nasal cannula oxygen  Post-op Assessment: Report given to RN and Post -op Vital signs reviewed and stable  Post vital signs: Reviewed and stable  Last Vitals:  Vitals:   11/26/17 1230 11/26/17 1235  BP:    Pulse: 86 89  Resp: 16 19  Temp:    SpO2: 100% 100%    Last Pain:  Vitals:   11/26/17 1000  TempSrc: Oral  PainSc:       Patients Stated Pain Goal: 3 (11/25/17 2115)  Complications: No apparent anesthesia complications

## 2017-11-27 ENCOUNTER — Encounter (HOSPITAL_COMMUNITY): Payer: Self-pay | Admitting: Cardiothoracic Surgery

## 2017-11-27 ENCOUNTER — Inpatient Hospital Stay (HOSPITAL_COMMUNITY): Payer: BLUE CROSS/BLUE SHIELD

## 2017-11-27 LAB — BLOOD GAS, ARTERIAL
ACID-BASE EXCESS: 2.3 mmol/L — AB (ref 0.0–2.0)
Bicarbonate: 26.4 mmol/L (ref 20.0–28.0)
DRAWN BY: 398981
O2 Content: 2 L/min
O2 SAT: 97.8 %
PH ART: 7.422 (ref 7.350–7.450)
Patient temperature: 98.6
pCO2 arterial: 41.3 mmHg (ref 32.0–48.0)
pO2, Arterial: 116 mmHg — ABNORMAL HIGH (ref 83.0–108.0)

## 2017-11-27 LAB — BASIC METABOLIC PANEL
Anion gap: 10 (ref 5–15)
BUN: 14 mg/dL (ref 6–20)
CALCIUM: 8.1 mg/dL — AB (ref 8.9–10.3)
CO2: 24 mmol/L (ref 22–32)
CREATININE: 1.12 mg/dL (ref 0.61–1.24)
Chloride: 101 mmol/L (ref 101–111)
GFR calc non Af Amer: >60 mL/min (ref >60)
Glucose, Bld: 88 mg/dL (ref 65–99)
Potassium: 4.2 mmol/L (ref 3.5–5.1)
Sodium: 135 mmol/L (ref 135–145)

## 2017-11-27 LAB — GLUCOSE, CAPILLARY
GLUCOSE-CAPILLARY: 138 mg/dL — AB (ref 65–99)
Glucose-Capillary: 89 mg/dL (ref 65–99)
Glucose-Capillary: 119 mg/dL — ABNORMAL HIGH (ref 65–99)

## 2017-11-27 LAB — CBC
HEMATOCRIT: 34.6 % — AB (ref 39.0–52.0)
Hemoglobin: 11.6 g/dL — ABNORMAL LOW (ref 13.0–17.0)
MCH: 27.8 pg (ref 26.0–34.0)
MCHC: 33.5 g/dL (ref 30.0–36.0)
MCV: 82.8 fL (ref 78.0–100.0)
PLATELETS: 592 10*3/uL — AB (ref 150–400)
RBC: 4.18 MIL/uL — AB (ref 4.22–5.81)
RDW: 12.8 % (ref 11.5–15.5)
WBC: 20.2 10*3/uL — AB (ref 4.0–10.5)

## 2017-11-27 MED ORDER — VANCOMYCIN HCL 10 G IV SOLR
1500.0000 mg | Freq: Once | INTRAVENOUS | Status: AC
  Administered 2017-11-27: 1500 mg via INTRAVENOUS
  Filled 2017-11-27: qty 1500

## 2017-11-27 MED ORDER — KETOROLAC TROMETHAMINE 15 MG/ML IJ SOLN
INTRAMUSCULAR | Status: AC
  Filled 2017-11-27: qty 1

## 2017-11-27 MED ORDER — FENTANYL 25 MCG/HR TD PT72
75.0000 ug | MEDICATED_PATCH | TRANSDERMAL | Status: DC
  Administered 2017-11-27 – 2017-11-30 (×2): 75 ug via TRANSDERMAL
  Filled 2017-11-27: qty 1
  Filled 2017-11-27: qty 3

## 2017-11-27 MED ORDER — VANCOMYCIN HCL IN DEXTROSE 750-5 MG/150ML-% IV SOLN
750.0000 mg | Freq: Three times a day (TID) | INTRAVENOUS | Status: DC
  Administered 2017-11-27 – 2017-11-29 (×7): 750 mg via INTRAVENOUS
  Filled 2017-11-27 (×8): qty 150

## 2017-11-27 MED ORDER — KETOROLAC TROMETHAMINE 15 MG/ML IJ SOLN
15.0000 mg | Freq: Four times a day (QID) | INTRAMUSCULAR | Status: AC
  Administered 2017-11-27 – 2017-11-29 (×8): 15 mg via INTRAVENOUS
  Filled 2017-11-27 (×7): qty 1

## 2017-11-27 MED ORDER — DEXTROSE-NACL 5-0.9 % IV SOLN
INTRAVENOUS | Status: DC
  Administered 2017-11-27: 12:00:00 via INTRAVENOUS

## 2017-11-27 MED ORDER — VANCOMYCIN HCL IN DEXTROSE 750-5 MG/150ML-% IV SOLN
750.0000 mg | Freq: Three times a day (TID) | INTRAVENOUS | Status: DC
Start: 2017-11-28 — End: 2017-11-27

## 2017-11-27 NOTE — Progress Notes (Signed)
1 Day Post-Op Procedure(s) (LRB): VIDEO ASSISTED THORACOSCOPY (VATS)/DRAIN EMPYEMA (Left) Subjective: Doing well but having incisional pain CXR clear No air leak  Objective: Vital signs in last 24 hours: Temp:  [97.7 F (36.5 C)-99 F (37.2 C)] 98.9 F (37.2 C) (02/09 0813) Pulse Rate:  [74-91] 80 (02/09 1100) Cardiac Rhythm: Normal sinus rhythm (02/09 1100) Resp:  [12-29] 15 (02/09 1100) BP: (114-157)/(58-115) 127/77 (02/09 1100) SpO2:  [97 %-100 %] 97 % (02/09 1100) Arterial Line BP: (132-182)/(65-99) 132/65 (02/09 1100) Weight:  [151 lb 0.2 oz (68.5 kg)] 151 lb 0.2 oz (68.5 kg) (02/09 0441)  Hemodynamic parameters for last 24 hours:   stable Intake/Output from previous day: 02/08 0701 - 02/09 0700 In: 3868 [P.O.:300; I.V.:2968; IV Piggyback:600] Out: 1665 [Urine:1405; Blood:30; Chest Tube:230] Intake/Output this shift: Total I/O In: 420 [I.V.:420] Out: 565 [Urine:525; Chest Tube:40]       Exam    General- alert and comfortable    Neck- no JVD, no cervical adenopathy palpable, no carotid bruit   Lungs- clear without rales, wheezes   Cor- regular rate and rhythm, no murmur , gallop   Abdomen- soft, non-tender   Extremities - warm, non-tender, minimal edema   Neuro- oriented, appropriate, no focal weakness   Lab Results: Recent Labs    11/26/17 0633 11/27/17 0415  WBC 16.7* 20.2*  HGB 12.5* 11.6*  HCT 36.9* 34.6*  PLT 565* 592*   BMET:  Recent Labs    11/26/17 0633 11/27/17 0415  NA 134* 135  K 4.6 4.2  CL 98* 101  CO2 22 24  GLUCOSE 88 88  BUN 16 14  CREATININE 1.25* 1.12  CALCIUM 8.8* 8.1*    PT/INR:  Recent Labs    11/25/17 0352  LABPROT 14.7  14.6  INR 1.16  1.15   ABG    Component Value Date/Time   PHART 7.422 11/27/2017 0330   HCO3 26.4 11/27/2017 0330   TCO2 26 06/25/2016 2029   O2SAT 97.8 11/27/2017 0330   CBG (last 3)  Recent Labs    11/26/17 1914 11/27/17 0037 11/27/17 0520  GLUCAP 103* 138* 89     Assessment/Plan: S/P Procedure(s) (LRB): VIDEO ASSISTED THORACOSCOPY (VATS)/DRAIN EMPYEMA (Left) Mobilize add toradol, fent patch Leave both tubes today  LOS: 4 days    Kathlee Nations Trigt III 11/27/2017

## 2017-11-27 NOTE — Plan of Care (Signed)
Patents pain has been not managed well on pca.  Recommend adjustments on day shift when surgeon rounds and will pass along in report.  Patient refused tramadol earlier.  Had very little drainage from chest tube and not noticeable air leak.  No sub q air.  Drainage is almost entirely serous at this point.

## 2017-11-27 NOTE — Progress Notes (Signed)
Patient ID: Pedro Owens, male   DOB: [birth year], 27 y.o.   MRN: [id number] EVENING ROUNDS NOTE :     [address]       [place] [address]             [phone]                 1 Day Post-Op Procedure(s) (LRB): VIDEO ASSISTED THORACOSCOPY (VATS)/DRAIN EMPYEMA (Left)  Total Length of Stay:  LOS: 4 days  BP 126/76 (BP Location: Right Arm)   Pulse 77   Temp 98.5 F (36.9 C) (Oral)   Resp 18   Ht 5\' 11"  (1.803 m)   Wt 151 lb 0.2 oz (68.5 kg)   SpO2 99%   BMI 21.06 kg/m   .Intake/Output      02/09 0701 - 02/10 0700   P.O.    I.V. (mL/kg) 517.5 (7.6)   IV Piggyback 250   Total Intake(mL/kg) 767.5 (11.2)   Urine (mL/kg/hr) 1475 (1.7)   Blood    Chest Tube 70   Total Output 1545   Net -777.5         . dextrose 5 % and 0.9% NaCl 10 mL/hr at 11/27/17 1900  . piperacillin-tazobactam (ZOSYN)  IV 3.375 g (11/27/17 1938)  . potassium chloride    . vancomycin Stopped (11/27/17 1356)     Lab Results  Component Value Date   WBC 20.2 (H) 11/27/2017   HGB 11.6 (L) 11/27/2017   HCT 34.6 (L) 11/27/2017   PLT 592 (H) 11/27/2017   GLUCOSE 88 11/27/2017   CHOL 97 01/06/2017   TRIG 114 01/06/2017   HDL 39 (L) 01/06/2017   LDLCALC 35 01/06/2017   ALT 25 11/25/2017   AST 33 11/25/2017   NA 135 11/27/2017   K 4.2 11/27/2017   CL 101 11/27/2017   CREATININE 1.12 11/27/2017   BUN 14 11/27/2017   CO2 24 11/27/2017   TSH 3.047 11/25/2017   INR 1.15 11/25/2017   INR 1.16 11/25/2017   HGBA1C 5.8 (H) 11/25/2017   Stable day Only 100 ml form ct past 12 hours   Delight Ovens MD  Beeper [phone] Office [phone] 11/27/2017 7:38 PM

## 2017-11-28 ENCOUNTER — Inpatient Hospital Stay (HOSPITAL_COMMUNITY): Payer: BLUE CROSS/BLUE SHIELD

## 2017-11-28 LAB — COMPREHENSIVE METABOLIC PANEL
ALK PHOS: 58 U/L (ref 38–126)
ALT: 24 U/L (ref 17–63)
AST: 34 U/L (ref 15–41)
Albumin: 2.1 g/dL — ABNORMAL LOW (ref 3.5–5.0)
Anion gap: 8 (ref 5–15)
BILIRUBIN TOTAL: 0.7 mg/dL (ref 0.3–1.2)
BUN: 13 mg/dL (ref 6–20)
CALCIUM: 8.4 mg/dL — AB (ref 8.9–10.3)
CHLORIDE: 100 mmol/L — AB (ref 101–111)
CO2: 26 mmol/L (ref 22–32)
CREATININE: 1.15 mg/dL (ref 0.61–1.24)
Glucose, Bld: 98 mg/dL (ref 65–99)
Potassium: 4.4 mmol/L (ref 3.5–5.1)
Sodium: 134 mmol/L — ABNORMAL LOW (ref 135–145)
Total Protein: 7 g/dL (ref 6.5–8.1)

## 2017-11-28 LAB — GLUCOSE, CAPILLARY: GLUCOSE-CAPILLARY: 91 mg/dL (ref 65–99)

## 2017-11-28 LAB — CBC
HEMATOCRIT: 34.8 % — AB (ref 39.0–52.0)
Hemoglobin: 11.4 g/dL — ABNORMAL LOW (ref 13.0–17.0)
MCH: 27.7 pg (ref 26.0–34.0)
MCHC: 32.8 g/dL (ref 30.0–36.0)
MCV: 84.5 fL (ref 78.0–100.0)
Platelets: 582 10*3/uL — ABNORMAL HIGH (ref 150–400)
RBC: 4.12 MIL/uL — AB (ref 4.22–5.81)
RDW: 13 % (ref 11.5–15.5)
WBC: 13.8 10*3/uL — AB (ref 4.0–10.5)

## 2017-11-28 NOTE — Progress Notes (Signed)
Patient ID: Pedro Owens, male   DOB: 1991/08/11, 27 y.o.   MRN: [id number] EVENING ROUNDS NOTE :     [address]       [place] [address]             [phone]                 2 Days Post-Op Procedure(s) (LRB): VIDEO ASSISTED THORACOSCOPY (VATS)/DRAIN EMPYEMA (Left)  Total Length of Stay:  LOS: 5 days  BP 139/85   Pulse 85   Temp 97.9 F (36.6 C) (Oral)   Resp 18   Ht 5\' 11"  (1.803 m)   Wt 151 lb 0.2 oz (68.5 kg)   SpO2 100%   BMI 21.06 kg/m   .Intake/Output      02/10 0701 - 02/11 0700   P.O. 960   I.V. (mL/kg) 124.1 (1.8)   IV Piggyback 250   Total Intake(mL/kg) 1334.1 (19.5)   Urine (mL/kg/hr) 700 (0.8)   Chest Tube 30   Total Output 730   Net +604.1         . dextrose 5 % and 0.9% NaCl 10 mL/hr at 11/28/17 1700  . piperacillin-tazobactam (ZOSYN)  IV Stopped (11/28/17 1550)  . potassium chloride    . vancomycin Stopped (11/28/17 1250)     Lab Results  Component Value Date   WBC 13.8 (H) 11/28/2017   HGB 11.4 (L) 11/28/2017   HCT 34.8 (L) 11/28/2017   PLT 582 (H) 11/28/2017   GLUCOSE 98 11/28/2017   CHOL 97 01/06/2017   TRIG 114 01/06/2017   HDL 39 (L) 01/06/2017   LDLCALC 35 01/06/2017   ALT 24 11/28/2017   AST 34 11/28/2017   NA 134 (L) 11/28/2017   K 4.4 11/28/2017   CL 100 (L) 11/28/2017   CREATININE 1.15 11/28/2017   BUN 13 11/28/2017   CO2 26 11/28/2017   TSH 3.047 11/25/2017   INR 1.15 11/25/2017   INR 1.16 11/25/2017   HGBA1C 5.8 (H) 11/25/2017   Patient feels much better this evening, minimal drainage from remaining chest tube   Delight Ovens MD  Beeper [phone] Office [phone] 11/28/2017 7:38 PM

## 2017-11-28 NOTE — Plan of Care (Signed)
Patient has had minimal drainage from chest tube over shift.  Pain has appeared better controled than last night but still responds to a minimum of 7/10 pain even after interventions.

## 2017-11-28 NOTE — Progress Notes (Signed)
Patient ID: RUE TINNEL, male   DOB: 01/11/1991, 27 y.o.   MRN: [id number] [facility] DAILY ICU PROGRESS NOTE                   [address]            [place] [address]          [phone]   2 Days Post-Op Procedure(s) (LRB): VIDEO ASSISTED THORACOSCOPY (VATS)/DRAIN EMPYEMA (Left)  Total Length of Stay:  LOS: 5 days   Subjective: Patient has no complaints other than he wants to keep his PCA pump.  Objective: Vital signs in last 24 hours: Temp:  [97.7 F (36.5 C)-98.7 F (37.1 C)] 97.7 F (36.5 C) (02/10 0800) Pulse Rate:  [62-89] 68 (02/10 0800) Cardiac Rhythm: Normal sinus rhythm (02/10 0800) Resp:  [11-24] 13 (02/10 0800) BP: (105-137)/(53-88) 124/68 (02/10 0800) SpO2:  [96 %-100 %] 100 % (02/10 0800) Arterial Line BP: (132-163)/(65-73) 132/65 (02/09 1100)  Filed Weights   11/23/17 2126 11/26/17 1129 11/27/17 0441  Weight: 162 lb 7.7 oz (73.7 kg) 162 lb 7.7 oz (73.7 kg) 151 lb 0.2 oz (68.5 kg)    Weight change:    Hemodynamic parameters for last 24 hours:    Intake/Output from previous day: 02/09 0701 - 02/10 0700 In: 2341.3 [P.O.:360; I.V.:631.3; IV Piggyback:1350] Out: 2100 [Urine:2000; Chest Tube:100]  Intake/Output this shift: Total I/O In: 84.1 [I.V.:34.1; IV Piggyback:50] Out: -   Current Meds: Scheduled Meds: . abacavir-dolutegravir-lamiVUDine  1 tablet Oral Daily  . bisacodyl  10 mg Oral Daily  . Chlorhexidine Gluconate Cloth  6 each Topical Daily  . fentaNYL  75 mcg Transdermal Q72H  . fentaNYL   Intravenous Q4H  . insulin aspart  0-24 Units Subcutaneous Q6H  . ketorolac  15 mg Intravenous Q6H  . mouth rinse  15 mL Mouth Rinse BID  . nicotine  14 mg Transdermal Daily  . pantoprazole  40 mg Oral Q1200  . senna-docusate  1 tablet Oral QHS  . sodium chloride flush  10-40 mL Intracatheter Q12H   Continuous Infusions: . dextrose 5 % and 0.9% NaCl 10 mL/hr at 11/28/17 0800  . piperacillin-tazobactam (ZOSYN)  IV Stopped (11/28/17  0748)  . potassium chloride    . vancomycin Stopped (11/28/17 0523)   PRN Meds:.benzonatate, diclofenac sodium, diphenhydrAMINE **OR** diphenhydrAMINE, naloxone **AND** sodium chloride flush, ondansetron (ZOFRAN) IV, potassium chloride, sodium chloride flush, traMADol  General appearance: alert and uncooperative Neurologic: intact Heart: regular rate and rhythm, S1, S2 normal, no murmur, click, rub or gallop Lungs: diminished breath sounds RLL Abdomen: soft, non-tender; bowel sounds normal; no masses,  no organomegaly Extremities: extremities normal, atraumatic, no cyanosis or edema and Homans sign is negative, no sign of DVT Wound: No air leak from chest tube  Lab Results: CBC: Recent Labs    11/27/17 0415 11/28/17 0510  WBC 20.2* 13.8*  HGB 11.6* 11.4*  HCT 34.6* 34.8*  PLT 592* 582*   BMET:  Recent Labs    11/27/17 0415 11/28/17 0510  NA 135 134*  K 4.2 4.4  CL 101 100*  CO2 24 26  GLUCOSE 88 98  BUN 14 13  CREATININE 1.12 1.15  CALCIUM 8.1* 8.4*    CMET: Lab Results  Component Value Date   WBC 13.8 (H) 11/28/2017   HGB 11.4 (L) 11/28/2017   HCT 34.8 (L) 11/28/2017   PLT 582 (H) 11/28/2017   GLUCOSE 98 11/28/2017   CHOL 97 01/06/2017  TRIG 114 01/06/2017   HDL 39 (L) 01/06/2017   LDLCALC 35 01/06/2017   ALT 24 11/28/2017   AST 34 11/28/2017   NA 134 (L) 11/28/2017   K 4.4 11/28/2017   CL 100 (L) 11/28/2017   CREATININE 1.15 11/28/2017   BUN 13 11/28/2017   CO2 26 11/28/2017   TSH 3.047 11/25/2017   INR 1.15 11/25/2017   INR 1.16 11/25/2017   HGBA1C 5.8 (H) 11/25/2017      PT/INR: No results for input(s): LABPROT, INR in the last 72 hours. Radiology: Dg Chest Port 1 View  Result Date: 11/28/2017 CLINICAL DATA:  Chest tube placement EXAM: PORTABLE CHEST 1 VIEW COMPARISON:  11/27/2017 FINDINGS: Left chest tube in place. Left central line is unchanged. Possible small loculated left apical hydropneumothorax again noted, unchanged. Heart is  borderline in size. Patchy opacities in the left lung, stable. Right lung is clear. IMPRESSION: No significant change since prior study. Electronically Signed   By: Charlett Nose M.D.   On: 11/28/2017 07:11     Assessment/Plan: S/P Procedure(s) (LRB): VIDEO ASSISTED THORACOSCOPY (VATS)/DRAIN EMPYEMA (Left) Mobilize Minimal chest tube drainage will DC 1 chest tube Breach count decreasing    Delight Ovens 11/28/2017 9:25 AM

## 2017-11-29 ENCOUNTER — Inpatient Hospital Stay (HOSPITAL_COMMUNITY): Payer: BLUE CROSS/BLUE SHIELD

## 2017-11-29 DIAGNOSIS — F640 Transsexualism: Secondary | ICD-10-CM

## 2017-11-29 DIAGNOSIS — Z8701 Personal history of pneumonia (recurrent): Secondary | ICD-10-CM

## 2017-11-29 DIAGNOSIS — J869 Pyothorax without fistula: Principal | ICD-10-CM

## 2017-11-29 LAB — BASIC METABOLIC PANEL
Anion gap: 11 (ref 5–15)
BUN: 16 mg/dL (ref 6–20)
CO2: 25 mmol/L (ref 22–32)
Calcium: 8.7 mg/dL — ABNORMAL LOW (ref 8.9–10.3)
Chloride: 100 mmol/L — ABNORMAL LOW (ref 101–111)
Creatinine, Ser: 1.23 mg/dL (ref 0.61–1.24)
GFR calc Af Amer: >60 mL/min (ref >60)
GFR calc non Af Amer: >60 mL/min (ref >60)
Glucose, Bld: 112 mg/dL — ABNORMAL HIGH (ref 65–99)
Potassium: 4.3 mmol/L (ref 3.5–5.1)
Sodium: 136 mmol/L (ref 135–145)

## 2017-11-29 LAB — CBC
HCT: 34.3 % — ABNORMAL LOW (ref 39.0–52.0)
Hemoglobin: 11.5 g/dL — ABNORMAL LOW (ref 13.0–17.0)
MCH: 28.2 pg (ref 26.0–34.0)
MCHC: 33.5 g/dL (ref 30.0–36.0)
MCV: 84.1 fL (ref 78.0–100.0)
Platelets: 549 10*3/uL — ABNORMAL HIGH (ref 150–400)
RBC: 4.08 MIL/uL — ABNORMAL LOW (ref 4.22–5.81)
RDW: 12.9 % (ref 11.5–15.5)
WBC: 12.1 10*3/uL — ABNORMAL HIGH (ref 4.0–10.5)

## 2017-11-29 MED ORDER — LINEZOLID 600 MG PO TABS
600.0000 mg | ORAL_TABLET | Freq: Two times a day (BID) | ORAL | Status: DC
  Administered 2017-11-29 – 2017-12-01 (×4): 600 mg via ORAL
  Filled 2017-11-29 (×4): qty 1

## 2017-11-29 MED ORDER — LEVOFLOXACIN 750 MG PO TABS
750.0000 mg | ORAL_TABLET | Freq: Every day | ORAL | Status: DC
  Administered 2017-11-29 – 2017-12-01 (×3): 750 mg via ORAL
  Filled 2017-11-29 (×4): qty 1

## 2017-11-29 MED ORDER — OXYCODONE HCL 5 MG PO TABS
5.0000 mg | ORAL_TABLET | ORAL | Status: DC | PRN
  Administered 2017-11-29 – 2017-12-01 (×12): 10 mg via ORAL
  Filled 2017-11-29 (×12): qty 2

## 2017-11-29 NOTE — Progress Notes (Signed)
Fentanyl PCA 56ml flushed down sink. Witnessed by Gaspar Garbe, RN

## 2017-11-29 NOTE — Progress Notes (Signed)
Clinical Social Worker met patient at bedside to discuss substance use resources. Patient stated he would like CSW to come back at a later time since she had just taken pain medication and is unable to communicate at this time. CSW to follow at a later time    Rhea Pink, MSW,  [place]

## 2017-11-29 NOTE — Progress Notes (Signed)
Stopped in to visit with patient while rounding the floor.  Patient expressed wanting to talk through some of the things going on and some decisions that need to be made.  Decisions about health, living longer than 25 and going to rehab for substance abuse.  Chaplain provided active listening and safe space for patient to share his thought and feelings without judgment.  Will continue to follow as needed.    11/29/17 1113  Clinical Encounter Type  Visited With Patient (BF stepped out)  Visit Type Psychological support;Spiritual support;Initial

## 2017-11-29 NOTE — Op Note (Signed)
NAME:  SPIKE, DESILETS NO.:  MEDICAL RECORD NO.:  [id number]  LOCATION:                                 FACILITY:  PHYSICIAN:  Kerin Perna, M.D.       DATE OF BIRTH:  DATE OF PROCEDURE:  11/26/2017 DATE OF DISCHARGE:                              OPERATIVE REPORT   OPERATIONS: 1. Left VATS (video-assisted thoracoscopic surgery). 2. Drainage of left empyema.  SURGEON:  Kerin Perna, MD.  ASSISTANT:  Doree Fudge, PA-C.  PREOPERATIVE DIAGNOSES:  Left lower lobe pneumonia, left empyema, history of IV drug abuse, and human immunodeficiency virus positive status.  POSTOPERATIVE DIAGNOSES:  Left lower lobe pneumonia, left empyema, history of IV drug abuse, and human immunodeficiency virus positive status.  ANESTHESIA:  General.  DESCRIPTION OF PROCEDURE:  After the patient had been fully examined, the procedure of left VATS for drainage of empyema explained to the patient including the indications, benefits, and risks of recurrent infection, bleeding, postoperative pain, recurrent empyema and death, the proper site was marked and documentation of informed consent was obtained.  The patient was taken to the operating room and placed supine on the operating table.  General anesthesia was induced and a double- lumen endotracheal tube was placed.  The patient remained stable.  He was then turned to expose left side up.  The left chest was prepped and draped as a sterile field.  A proper time-out was performed.  A small incision was made at the tip of the scapula, and a 5-mm port and camera were inserted.  The pleural space was obliterated with adhesions and there was no visibility.  A small incision was then made in the left 5th interspace with a muscle- sparing technique of the latissimus.  The free pleural space was entered and developed.  There was loculated glue-like material in the pleural space with also semi-purulent curdle milk  pockets of fluid.  Using careful dissection, the lower lobe was freed up to enter and drained all of the loculated spaces.  This was performed all the way up to the apex into the left chest using the camera and VATS instruments through the small incision.  The pleural space was entirely opened and debrided and irrigated.  Then, 2 chest tubes were placed inferiorly and superiorly in the left pleural space and brought out through separate incisions and secured to the skin.  Next, the ribs were reapproximated with a single pericostal suture after the lung was re-expanded to fill the space.  Next, the chest wall incision was closed in layers using Vicryl.  The chest tubes were connected to a Pleur-evac drainage system.  The patient was turned supine, extubated, and then prepared for transport to the recovery room in stable condition.     Kerin Perna, M.D.     PV/MEDQ  D:  11/28/2017  T:  11/28/2017  Job:  [id number]

## 2017-11-29 NOTE — Progress Notes (Signed)
3 Days Post-Op Procedure(s) (LRB): VIDEO ASSISTED THORACOSCOPY (VATS)/DRAIN EMPYEMA (Left) Subjective: CXR clear- no drainage from tube- will DC  Objective: Vital signs in last 24 hours: Temp:  [97.7 F (36.5 C)-98.5 F (36.9 C)] 98.5 F (36.9 C) (02/10 2319) Pulse Rate:  [66-131] 66 (02/11 0400) Cardiac Rhythm: Normal sinus rhythm (02/11 0400) Resp:  [11-23] 12 (02/11 0400) BP: (109-139)/(64-108) 112/66 (02/11 0400) SpO2:  [95 %-100 %] 100 % (02/11 0400)  Hemodynamic parameters for last 24 hours:   stable Intake/Output from previous day: 02/10 0701 - 02/11 0700 In: 1364.1 [P.O.:960; I.V.:154.1; IV Piggyback:250] Out: 1160 [Urine:1080; Chest Tube:80] Intake/Output this shift: No intake/output data recorded.       Exam    General- alert and comfortable    Neck- no JVD, no cervical adenopathy palpable, no carotid bruit   Lungs- clear without rales, wheezes   Cor- regular rate and rhythm, no murmur , gallop   Abdomen- soft, non-tender   Extremities - warm, non-tender, minimal edema   Neuro- oriented, appropriate, no focal weakness   Lab Results: Recent Labs    11/28/17 0510 11/29/17 0200  WBC 13.8* 12.1*  HGB 11.4* 11.5*  HCT 34.8* 34.3*  PLT 582* 549*   BMET:  Recent Labs    11/28/17 0510 11/29/17 0200  NA 134* 136  K 4.4 4.3  CL 100* 100*  CO2 26 25  GLUCOSE 98 112*  BUN 13 16  CREATININE 1.15 1.23  CALCIUM 8.4* 8.7*    PT/INR: No results for input(s): LABPROT, INR in the last 72 hours. ABG    Component Value Date/Time   PHART 7.422 11/27/2017 0330   HCO3 26.4 11/27/2017 0330   TCO2 26 06/25/2016 2029   O2SAT 97.8 11/27/2017 0330   CBG (last 3)  Recent Labs    11/27/17 0520 11/27/17 1213 11/28/17 0016  GLUCAP 89 119* 91    Assessment/Plan: S/P Procedure(s) (LRB): VIDEO ASSISTED THORACOSCOPY (VATS)/DRAIN EMPYEMA (Left) d/c tubes/lines Plan for transfer to step-down: see transfer orders cont iv antibiotics   LOS: 6 days    Pedro Owens 11/29/2017

## 2017-11-29 NOTE — Progress Notes (Signed)
[facility] BRIEF SICU PROGRESS NOTE  3 Days Post-Op  S/P Procedure(s) (LRB): VIDEO ASSISTED THORACOSCOPY (VATS)/DRAIN EMPYEMA (Left)   Stable day  Plan: Awaiting bed for transfer out of ICU  Purcell Nails, MD 11/29/2017 6:11 PM

## 2017-11-29 NOTE — Care Management Note (Addendum)
Case Management Note  Patient Details  Name: Pedro Owens MRN: [id number] Date of Birth: 1991-03-25  Subjective/Objective:    Patient from home with cousin, pta indep, has 042, IVDU, POD 3 VATS Empyema, chest tubes dc'cd today, conts on iv abx. Patient states she has not seen PCP in 6 years.  She states she does not have a PCP. NCM will give her the [facility] number to call to assist in getting PCP.  She also follows up at the ID clinic.   She has medication coverage.             Action/Plan: NCM will follow for dc needs.   Expected Discharge Date:                  Expected Discharge Plan:  Home/Self Care  In-House Referral:     Discharge planning Services  CM Consult  Post Acute Care Choice:    Choice offered to:     DME Arranged:    DME Agency:     HH Arranged:    HH Agency:     Status of Service:  In process, will continue to follow  If discussed at Long Length of Stay Meetings, dates discussed:    Additional Comments:  Leone Haven, RN 11/29/2017, 10:28 AM

## 2017-11-29 NOTE — Progress Notes (Deleted)
Continuing to provide support for this family as they make decisions regarding this patient's care which may consist of Hospice or Palliative care.  Chaplain provided active listening and safe space for them to process the needs of his spouse/their mom.  Met four sons today.  Pleasant family.  Continual prayer for the patient.    11/29/17 1519  Clinical Encounter Type  Visited With Patient and family together  Visit Type Psychological support;Follow-up;Spiritual support;Critical Care              

## 2017-11-29 NOTE — Progress Notes (Addendum)
Subjective: Complaining of more pain with coughing in particular now that he is on oral opiates   Antibiotics:  Anti-infectives (From admission, onward)   Start     Dose/Rate Route Frequency Ordered Stop   11/28/17 0900  vancomycin (VANCOCIN) IVPB 750 mg/150 ml premix  Status:  Discontinued     750 mg 150 mL/hr over 60 Minutes Intravenous Every 8 hours 11/27/17 0001 11/27/17 1212   11/27/17 1211  vancomycin (VANCOCIN) IVPB 750 mg/150 ml premix     750 mg 150 mL/hr over 60 Minutes Intravenous Every 8 hours 11/27/17 1212     11/27/17 0100  vancomycin (VANCOCIN) 1,500 mg in sodium chloride 0.9 % 500 mL IVPB     1,500 mg 250 mL/hr over 120 Minutes Intravenous  Once 11/27/17 0001 11/27/17 0405   11/26/17 2000  piperacillin-tazobactam (ZOSYN) IVPB 3.375 g  Status:  Discontinued     3.375 g 12.5 mL/hr over 240 Minutes Intravenous Every 8 hours 11/26/17 1852 11/26/17 1924   11/26/17 2000  piperacillin-tazobactam (ZOSYN) IVPB 3.375 g     3.375 g 12.5 mL/hr over 240 Minutes Intravenous Every 8 hours 11/26/17 1924     11/26/17 1100  cefUROXime (ZINACEF) 1.5 g in dextrose 5 % 50 mL IVPB     1.5 g 100 mL/hr over 30 Minutes Intravenous On call to O.R. 11/25/17 0738 11/26/17 1941   11/24/17 1730  vancomycin (VANCOCIN) IVPB 750 mg/150 ml premix  Status:  Discontinued     750 mg 150 mL/hr over 60 Minutes Intravenous Every 8 hours 11/24/17 0846 11/24/17 1040   11/24/17 1245  linezolid (ZYVOX) tablet 600 mg  Status:  Discontinued     600 mg Oral Every 12 hours 11/24/17 1242 11/26/17 1852   11/24/17 1245  levofloxacin (LEVAQUIN) tablet 750 mg  Status:  Discontinued     750 mg Oral Daily 11/24/17 1242 11/26/17 1852   11/24/17 1200  linezolid (ZYVOX) tablet 600 mg  Status:  Discontinued     600 mg Oral Every 12 hours 11/24/17 1040 11/24/17 1242   11/24/17 1045  levofloxacin (LEVAQUIN) tablet 750 mg  Status:  Discontinued     750 mg Oral Daily 11/24/17 1040 11/24/17 1242   11/24/17 1000   abacavir-dolutegravir-lamiVUDine (TRIUMEQ) 600-50-300 MG per tablet 1 tablet     1 tablet Oral Daily 11/23/17 1918     11/24/17 1000  levofloxacin (LEVAQUIN) tablet 750 mg  Status:  Discontinued     750 mg Oral Daily 11/23/17 1918 11/24/17 0846   11/24/17 1000  cefTRIAXone (ROCEPHIN) 2 g in dextrose 5 % 50 mL IVPB  Status:  Discontinued     2 g 100 mL/hr over 30 Minutes Intravenous Every 12 hours 11/24/17 0846 11/24/17 1040   11/24/17 0930  vancomycin (VANCOCIN) 1,250 mg in sodium chloride 0.9 % 250 mL IVPB  Status:  Discontinued     1,250 mg 166.7 mL/hr over 90 Minutes Intravenous  Once 11/24/17 0846 11/24/17 1130   11/23/17 2200  linezolid (ZYVOX) tablet 600 mg  Status:  Discontinued     600 mg Oral 2 times daily 11/23/17 1918 11/24/17 0846      Medications: Scheduled Meds: . abacavir-dolutegravir-lamiVUDine  1 tablet Oral Daily  . bisacodyl  10 mg Oral Daily  . Chlorhexidine Gluconate Cloth  6 each Topical Daily  . fentaNYL  75 mcg Transdermal Q72H  . mouth rinse  15 mL Mouth Rinse BID  . nicotine  14 mg Transdermal  Daily  . pantoprazole  40 mg Oral Q1200  . senna-docusate  1 tablet Oral QHS  . sodium chloride flush  10-40 mL Intracatheter Q12H   Continuous Infusions: . dextrose 5 % and 0.9% NaCl 10 mL/hr at 11/28/17 2000  . piperacillin-tazobactam (ZOSYN)  IV Stopped (11/29/17 1526)  . potassium chloride    . vancomycin Stopped (11/29/17 1226)   PRN Meds:.benzonatate, diclofenac sodium, ondansetron (ZOFRAN) IV, oxyCODONE, potassium chloride, sodium chloride flush, traMADol    Objective: Weight change:   Intake/Output Summary (Last 24 hours) at 11/29/2017 1617 Last data filed at 11/29/2017 1126 Gross per 24 hour  Intake 290 ml  Output 780 ml  Net -490 ml   Blood pressure 129/76, pulse 91, temperature 98 F (36.7 C), temperature source Oral, resp. rate 14, height 5\' 11"  (1.803 m), weight 151 lb 0.2 oz (68.5 kg), SpO2 100 %. Temp:  [97.7 F (36.5 C)-98.5 F (36.9 C)]  98 F (36.7 C) (02/11 1558) Pulse Rate:  [66-131] 91 (02/11 1200) Resp:  [11-22] 14 (02/11 1200) BP: (109-129)/(66-108) 129/76 (02/11 1200) SpO2:  [97 %-100 %] 100 % (02/11 1200)  Physical Exam: General: Alert and awake, oriented x3, not in any acute distress. HEENT: anicteric sclera,  EOMI CVS regular rate, normal r,  no murmur rubs or gallops Chest: Reduced breath sounds at the bases Abdomen: soft nondistended,  Extremities: no  clubbing or edema noted bilaterally Skin: no rashes Lymph: no new lymphadenopathy Neuro: nonfocal  CBC: CBC Latest Ref Rng & Units 11/29/2017 11/28/2017 11/27/2017  WBC 4.0 - 10.5 K/uL 12.1(H) 13.8(H) 20.2(H)  Hemoglobin 13.0 - 17.0 g/dL 11.5(L) 11.4(L) 11.6(L)  Hematocrit 39.0 - 52.0 % 34.3(L) 34.8(L) 34.6(L)  Platelets 150 - 400 K/uL 549(H) 582(H) 592(H)      BMET Recent Labs    11/28/17 0510 11/29/17 0200  NA 134* 136  K 4.4 4.3  CL 100* 100*  CO2 26 25  GLUCOSE 98 112*  BUN 13 16  CREATININE 1.15 1.23  CALCIUM 8.4* 8.7*     Liver Panel  Recent Labs    11/28/17 0510  PROT 7.0  ALBUMIN 2.1*  AST 34  ALT 24  ALKPHOS 58  BILITOT 0.7       Sedimentation Rate No results for input(s): ESRSEDRATE in the last 72 hours. C-Reactive Protein No results for input(s): CRP in the last 72 hours.  Micro Results: Recent Results (from the past 720 hour(s))  Blood Culture (routine x 2)     Status: None   Collection Time: 11/15/17 12:15 PM  Result Value Ref Range Status   Specimen Description BLOOD RIGHT ANTECUBITAL  Final   Special Requests   Final    BOTTLES DRAWN AEROBIC AND ANAEROBIC Blood Culture adequate volume   Culture   Final    NO GROWTH 5 DAYS Performed at [facility], [address], [place], [place] [address]    Report Status 11/20/2017 FINAL  Final  Blood Culture (routine x 2)     Status: None   Collection Time: 11/15/17 12:50 PM  Result Value Ref Range Status   Specimen Description BLOOD LEFT HAND  Final    Special Requests   Final    BOTTLES DRAWN AEROBIC AND ANAEROBIC Blood Culture results may not be optimal due to an inadequate volume of blood received in culture bottles   Culture   Final    NO GROWTH 5 DAYS Performed at [facility], [address], [place], [place] [address]  Report Status 11/20/2017 FINAL  Final  Culture, blood (routine x 2)     Status: None   Collection Time: 11/17/17  9:05 AM  Result Value Ref Range Status   Specimen Description BLOOD LEFT ARM  Final   Special Requests IN PEDIATRIC BOTTLE Blood Culture adequate volume  Final   Culture   Final    NO GROWTH 5 DAYS Performed at [facility], [address], [place], [place] [address]    Report Status 11/22/2017 FINAL  Final  Culture, blood (routine x 2)     Status: None   Collection Time: 11/17/17  9:10 AM  Result Value Ref Range Status   Specimen Description BLOOD LEFT ANTECUBITAL  Final   Special Requests IN PEDIATRIC BOTTLE Blood Culture adequate volume  Final   Culture   Final    NO GROWTH 5 DAYS Performed at [facility], [address], [place], [place] [address]    Report Status 11/22/2017 FINAL  Final  Surgical pcr screen     Status: None   Collection Time: 11/24/17  4:08 PM  Result Value Ref Range Status   MRSA, PCR NEGATIVE NEGATIVE Final   Staphylococcus aureus NEGATIVE NEGATIVE Final    Comment: (NOTE) The Xpert SA Assay (FDA approved for NASAL specimens in patients 65 years of age and older), is one component of a comprehensive surveillance program. It is not intended to diagnose infection nor to guide or monitor treatment. Performed at [facility], [address], [place], [place] [address]   Body fluid culture     Status: None (Preliminary result)   Collection Time: 11/26/17  3:48 PM  Result Value Ref Range Status   Specimen Description FLUID LEFT PLEURAL  Final   Special Requests FLUID ON SWABS  Final   Gram Stain NO WBC SEEN NO ORGANISMS SEEN   Final    Culture   Final    NO GROWTH 3 DAYS Performed at [facility], [address], [place], [place] [address]    Report Status PENDING  Incomplete  Body fluid culture     Status: None (Preliminary result)   Collection Time: 11/26/17  3:50 PM  Result Value Ref Range Status   Specimen Description FLUID LEFT PLEURAL  Final   Special Requests NONE  Final   Gram Stain   Final    RARE WBC PRESENT, PREDOMINANTLY PMN NO ORGANISMS SEEN    Culture   Final    NO GROWTH 3 DAYS Performed at [facility], [address], [place], [place] [address]    Report Status PENDING  Incomplete    Studies/Results: Dg Chest Port 1 View  Result Date: 11/29/2017 CLINICAL DATA:  Left chest tube, status post VATS EXAM: PORTABLE CHEST 1 VIEW COMPARISON:  11/28/2017 FINDINGS: 1 left chest tube remains.  Left IJ central line tip mid SVC level. Stable residual left mid and lower lung patchy airspace process. Trace left effusion suspected. No significant pneumothorax. Right lung remains clear. Normal heart size and vascularity. Trachea is midline. Monitor leads overlie the chest. IMPRESSION: One left chest tube remains. Stable patchy airspace process in the left mid and lower lung with a small left effusion. No significant or enlarging pneumothorax. Electronically Signed   By: Judie Petit.  Shick M.D.   On: 11/29/2017 07:37   Dg Chest Port 1 View  Result Date: 11/28/2017 CLINICAL DATA:  Chest tube placement EXAM: PORTABLE CHEST 1 VIEW COMPARISON:  11/27/2017 FINDINGS:  Left chest tube in place. Left central line is unchanged. Possible small loculated left apical hydropneumothorax again noted, unchanged. Heart is borderline in size. Patchy opacities in the left lung, stable. Right lung is clear. IMPRESSION: No significant change since prior study. Electronically Signed   By: Charlett Nose M.D.   On: 11/28/2017 07:11      Assessment/Plan:  INTERVAL HISTORY: Patient is status post VATS with decortication   Active  Problems:   Loculated pleural effusion   Septic embolism (HCC)   Transgender   SOB (shortness of breath)   Pressure injury of skin    Pedro Owens is a 27 y.o. male with HIV disease, IVDU hx pneumonia who was found to have empyema acquiring VATS.  1.  Empyema: No new organisms or specific organisms isolated on pleural fluid cultures with therefore continue Zyvox and levofloxacin.  She/he has a supply that we provided at discharge previously that she can resume when she is ready to discharge from the hospital. She was changed to vancomycin and zosyn postoperatively but does not need these drugs per se and with heightened renal toxicity would not give both.  I am changing back to zyvox and levaquin   2.  HIV continue with TRIUMEQ.  She has appt with Rexene Alberts on 12/02/17. If she will be DC after then or cannot make that appt please call  [phone] to reschedule this appt    I will sign off for now please call with any questions.   LOS: 6 days   Acey Lav 11/29/2017, 4:17 PM

## 2017-11-30 ENCOUNTER — Inpatient Hospital Stay (HOSPITAL_COMMUNITY): Payer: BLUE CROSS/BLUE SHIELD

## 2017-11-30 ENCOUNTER — Telehealth: Payer: Self-pay | Admitting: Nurse Practitioner

## 2017-11-30 LAB — CBC
HCT: 35.9 % — ABNORMAL LOW (ref 39.0–52.0)
Hemoglobin: 12 g/dL — ABNORMAL LOW (ref 13.0–17.0)
MCH: 27.8 pg (ref 26.0–34.0)
MCHC: 33.4 g/dL (ref 30.0–36.0)
MCV: 83.3 fL (ref 78.0–100.0)
Platelets: 605 10*3/uL — ABNORMAL HIGH (ref 150–400)
RBC: 4.31 MIL/uL (ref 4.22–5.81)
RDW: 12.7 % (ref 11.5–15.5)
WBC: 11.4 10*3/uL — ABNORMAL HIGH (ref 4.0–10.5)

## 2017-11-30 LAB — BODY FLUID CULTURE
Culture: NO GROWTH
Culture: NO GROWTH
Gram Stain: NONE SEEN

## 2017-11-30 LAB — BASIC METABOLIC PANEL
Anion gap: 13 (ref 5–15)
BUN: 14 mg/dL (ref 6–20)
CO2: 26 mmol/L (ref 22–32)
Calcium: 8.9 mg/dL (ref 8.9–10.3)
Chloride: 98 mmol/L — ABNORMAL LOW (ref 101–111)
Creatinine, Ser: 1.03 mg/dL (ref 0.61–1.24)
GFR calc Af Amer: >60 mL/min (ref >60)
GFR calc non Af Amer: >60 mL/min (ref >60)
Glucose, Bld: 90 mg/dL (ref 65–99)
Potassium: 4.2 mmol/L (ref 3.5–5.1)
Sodium: 137 mmol/L (ref 135–145)

## 2017-11-30 NOTE — Care Management Note (Signed)
Case Management Note Previous CM note completed by Leone Haven, RN--11/29/2017, 10:28 AM   Patient Details  Name: Pedro Owens MRN: [id number] Date of Birth: 08/14/1991  Subjective/Objective:    Patient from home with cousin, pta indep, has 042, IVDU, POD 3 VATS Empyema, chest tubes dc'cd today, conts on iv abx. Patient states she has not seen PCP in 6 years.  She states she does not have a PCP. NCM will give her the [facility] number to call to assist in getting PCP.  She also follows up at the ID clinic.   She has medication coverage.             Action/Plan: NCM will follow for dc needs.   Expected Discharge Date:                  Expected Discharge Plan:  Home/Self Care  In-House Referral:     Discharge planning Services  CM Consult  Post Acute Care Choice:    Choice offered to:     DME Arranged:    DME Agency:     HH Arranged:    HH Agency:     Status of Service:  In process, will continue to follow  If discussed at Long Length of Stay Meetings, dates discussed:    Discharge Disposition: home/self care   Additional Comments:  11/30/17- 1040- [name] RN, CM- CSW to f/u with pt regarding substance abuse programs- per previous CM note health connect # provided for PCP needs- also noted per IM MD note that they will schedule him for follow-up in their general medicine clinic, and thier Suboxone clinic. He will also follow with infectious disease clinic.      [name], RN 11/30/2017, 10:40 AM [phone] 4E Transition Care Coordinator

## 2017-11-30 NOTE — Discharge Summary (Signed)
Physician Discharge Summary  Patient ID: Pedro Owens MRN: [id number] DOB/AGE: [birth year] 26 y.o.  Admit date: 11/23/2017 Discharge date: 12/01/2017  Admission Diagnoses: loculated pleural effusion/empyema  Discharge Diagnoses:  Active Problems:   Loculated pleural effusion   Septic embolism (HCC)   Transgender   SOB (shortness of breath)   Pressure injury of skin   Patient Active Problem List   Diagnosis Date Noted  . Pressure injury of skin 11/26/2017  . SOB (shortness of breath)   . Septic embolism (HCC)   . Transgender   . Pleural effusion, bacterial 11/23/2017  . Loculated pleural effusion 11/23/2017  . Hepatitis C antibody test positive 11/19/2017  . Septic pulmonary embolism (HCC) 11/19/2017  . Acute endocarditis   . Pleuritic chest pain   . IVDU (intravenous drug user)   . Endocarditis, suspected 11/18/2017  . SIRS (systemic inflammatory response syndrome) (HCC) 11/15/2017  . Injury, superficial, elbow, forearm, or wrist with infection, right, initial encounter 11/12/2016  . Abscess of right forearm 11/04/2016  . HIV disease (HCC) 04/07/2016  . Cigarette smoker 04/07/2016  . Polysubstance abuse (HCC) 04/07/2016  . Depression 08/14/2015  . Conversion disorder 08/14/2015  . Hemorrhoids, internal, with bleeding 08/25/2011  . Anal warts 08/25/2011  . ANXIETY 09/24/2010  . ADHD 09/24/2010    HPI: at time of admission (note the patient was recently hospitalized for possible infected endocarditis/SIRS.) Short interval readmission for this rather complicated 27 year old person who identifies as a male.  She  has HIV disease.  Hepatitis C.  History of polysubstance abuse including parenteral heroin.  Admitted January 28 - February 4 with dyspnea, cough, and pleuritic chest pain.  Initial chest x-ray unremarkable.  CT scan of the chest showed a number of atypical pulmonary nodules in the left lung felt consistent with septic emboli.  She had intermittent high fever but  cultures remained sterile.  Echocardiogram did not show any valvular vegetations.  She was treated empirically for endocarditis initially with parenteral vancomycin and ceftriaxone then transition to oral linezolid and Levaquin at time of discharge.  She had persistent atypical left upper quadrant/left lower chest wall pain of unclear etiology.  Initial CT scan of the abdomen failed to show any additional pathology.  She was treated symptomatically.  She was interested in getting off narcotics.  Suboxone started prior to discharge.  She was seen by Dr. Pollyann Savoy this morning to enroll her in a Suboxone maintenance program.  Due to persistent left chest pain, a chest radiograph was obtained which shows what is likely loculated pleural fluid superiorly and posteriorly in the left lung.  Suspicion that a septic pulmonary embolus has seated the pleural space.  Discharged Condition: good  Hospital Course: The patient was admitted for further evaluation.  CT scan was obtained and findings were consistent with possible empyema.  Cardiothoracic surgical consultation was obtained with Lovett Sox who evaluated the patient and studies and agreed with recommendations to proceed with video-assisted thoracoscopic surgery.  On 11/26/2017 the patient was taken the operating room where  she underwent the below described procedure. She tolerated well and was taken to the  postanesthesia care unit in stable condition.  Postoperative hospital course:  The patient has progressed nicely.  She has maintained stable hemodynamics.  Chest x-ray has shown significant improvement.  Tubes and lines have been discontinued in a standard fashion.  She was continued on intravenous antibiotics and infectious disease is planning transition to oral.  Incision is noted to be healing well without she  is tolerating gradually increasing activities using standard protocols.  She is remained afebrile.  At time of discharge the patient is felt  to be quite stable.  Consults: ID and medicine  Significant Diagnostic Studies: chest CT IMPRESSION: 1. Interim development moderate to large multiloculated pleural effusion with mild rim enhancement at the lingula and left lung base, possible empyema. 2. Consolidation in the left lower lobe may reflect atelectasis or pneumonia. Bilateral peripheral nodules with ground-glass density, suspect for pulmonary emboli. 3. Mild mediastinal adenopathy likely reactive.  Treatments: surgery:                               OPERATIVE REPORT   OPERATIONS: 1. Left VATS (video-assisted thoracoscopic surgery). 2. Drainage of left empyema.  SURGEON:  Kerin Perna, MD.  ASSISTANT:  Doree Fudge, PA-C.  PREOPERATIVE DIAGNOSES:  Left lower lobe pneumonia, left empyema, history of IV drug abuse, and human immunodeficiency virus positive status.  POSTOPERATIVE DIAGNOSES:  Left lower lobe pneumonia, left empyema, history of IV drug abuse, and human immunodeficiency virus positive status.  ANESTHESIA:  General.   Discharge Exam: Blood pressure 110/76, pulse 81, temperature 98.4 F (36.9 C), temperature source Oral, resp. rate (!) 24, height 5\' 11"  (1.803 m), weight 155 lb 14.4 oz (70.7 kg), SpO2 92 %.   General appearance: alert, cooperative and no distress Heart: regular rate and rhythm Lungs: some coarseness in upper airways Abdomen: benign Extremities: no edema Wound: incis healing well   Disposition: 01-Home or Self Care Results for orders placed or performed during the hospital encounter of 11/23/17  Surgical pcr screen     Status: None   Collection Time: 11/24/17  4:08 PM  Result Value Ref Range Status   MRSA, PCR NEGATIVE NEGATIVE Final   Staphylococcus aureus NEGATIVE NEGATIVE Final    Comment: (NOTE) The Xpert SA Assay (FDA approved for NASAL specimens in patients 40 years of age and older), is one component of a comprehensive surveillance program. It is  not intended to diagnose infection nor to guide or monitor treatment. Performed at [facility], [address] [place], [place] [address]   Body fluid culture     Status: None   Collection Time: 11/26/17  3:48 PM  Result Value Ref Range Status   Specimen Description FLUID LEFT PLEURAL  Final   Special Requests FLUID ON SWABS  Final   Gram Stain NO WBC SEEN NO ORGANISMS SEEN   Final   Culture   Final    NO GROWTH 3 DAYS Performed at [facility], [address] [place], [place] [address]    Report Status 11/30/2017 FINAL  Final  Body fluid culture     Status: None   Collection Time: 11/26/17  3:50 PM  Result Value Ref Range Status   Specimen Description FLUID LEFT PLEURAL  Final   Special Requests NONE  Final   Gram Stain   Final    RARE WBC PRESENT, PREDOMINANTLY PMN NO ORGANISMS SEEN    Culture   Final    NO GROWTH 3 DAYS Performed at [facility], [address] [place], [place] [address]    Report Status 11/30/2017 FINAL  Final   Discharge Instructions    Discharge patient   Complete by:  As directed    Discharge disposition:  01-Home or Self Care   Discharge patient date:  12/01/2017     Allergies as  of 12/01/2017      Reactions   Acetaminophen Diarrhea   Flares up crohns disease   Other    IV CONTRAST   Vicodin [hydrocodone-acetaminophen] Nausea And Vomiting   Dilaudid [hydromorphone Hcl] Hives, Rash      Medication List    TAKE these medications   abacavir-dolutegravir-lamiVUDine 600-50-300 MG tablet Commonly known as:  TRIUMEQ Take 1 tablet by mouth daily.   benzonatate 100 MG capsule Commonly known as:  TESSALON Take 1 capsule (100 mg total) by mouth 3 (three) times daily as needed for cough.   diclofenac sodium 1 % Gel Commonly known as:  VOLTAREN Apply 2 g topically every 6 (six) hours as needed (pain).   levofloxacin 750 MG tablet Commonly known as:  LEVAQUIN Take 1 tablet (750 mg total) by mouth daily.   linezolid  600 MG tablet Commonly known as:  ZYVOX Take 1 tablet (600 mg total) by mouth 2 (two) times daily.   nicotine 14 mg/24hr patch Commonly known as:  NICODERM CQ - dosed in mg/24 hours Place 14 mg onto the skin daily.   oxyCODONE 5 MG immediate release tablet Commonly known as:  Oxy IR/ROXICODONE Take 1-2 tablets (5-10 mg total) by mouth every 6 (six) hours as needed for severe pain.      Follow-up Information    [facility]. Go on 12/03/2017.   Why:  At 2:15 pm hospital follow up. Please arrive 15 minutes early and bring all your medications. Thank you! Contact information: [address] [place] [address] [phone]       Kerin Perna, MD Follow up.   Specialty:  Cardiothoracic Surgery Why:  Appointment to see his surgeon on 12/22/2017 at 2:30 PM.  Please obtain a chest x-ray at [facility] imaging at 2 PM.  [facility] imaging is located in the same office complex on the first floor. Contact information: [address] [place] [address] [phone]        [facility] [place] Follow up.   Specialty:  Cardiothoracic Surgery Why:  Appointments he is a nurse on 12/08/2017 at 10:30 AM for suture removal. Contact information: [address] [place], [address] [place] [address] [phone]       Inez Catalina, MD Follow up.   Specialty:  Internal Medicine Why:  You have an appointment back in the suboxone clinic to get re-started once your pain from the procedure improves. It is on February 26th at 9:45 am at the internal medicine center clinic  Contact information: [address] [place] [address] [phone]           Signed: Rowe Clack 12/01/2017, 1:19 PM

## 2017-11-30 NOTE — Progress Notes (Signed)
Medicine attending: We greatly appreciate assistance from cardiovascular surgery. Patient underwent VATS and drainage of loculated left pleural effusions on February 8.  He tolerated the procedure well.  Chest tubes now out. Fluid cultures no growth so far.  One sample with a few Harig cells the other with none. He was changed temporarily to parenteral antibiotics.  Now stable and oral Levaquin and Zyvox resumed to complete planned 6-week course of antibiotics for culture negative endocarditis. Suboxone was discontinued prior to surgery so that he could benefit by narcotic analgesia. He is good to stay on short term narcotic analgesics to cover postsurgical pain. We will taper narcotics at time of discharge and resume Suboxone at the appropriate time as he recovers from surgery. Lab profile today is stable.  Renal function now back to normal.  Perno count recovering post drainage of empyema down from peak 20,000 to 11,000 today. No other acute medical issues at this time.  Stable for discharge when okay with CVTS. We will schedule him for follow-up in our general medicine clinic, and our Suboxone clinic. He will follow with infectious disease for his HIV management and newly diagnosed hepatitis C.

## 2017-11-30 NOTE — Progress Notes (Addendum)
[address]       [place] [address]             [phone]      4 Days Post-Op Procedure(s) (LRB): VIDEO ASSISTED THORACOSCOPY (VATS)/DRAIN EMPYEMA (Left) Subjective: Feeling overall pretty well, some soreness  Objective: Vital signs in last 24 hours: Temp:  [97.7 F (36.5 C)-98.6 F (37 C)] 98.4 F (36.9 C) (02/12 0400) Pulse Rate:  [72-91] 82 (02/12 0400) Cardiac Rhythm: Normal sinus rhythm (02/11 2210) Resp:  [11-14] 12 (02/12 0400) BP: (114-129)/(68-93) 120/70 (02/12 0400) SpO2:  [98 %-100 %] 99 % (02/12 0400) Weight:  [155 lb 14.4 oz (70.7 kg)] 155 lb 14.4 oz (70.7 kg) (02/11 2212)  Hemodynamic parameters for last 24 hours:    Intake/Output from previous day: 02/11 0701 - 02/12 0700 In: 200 [IV Piggyback:200] Out: 1575 [Urine:1575] Intake/Output this shift: No intake/output data recorded.  General appearance: alert, cooperative and no distress Heart: regular rate and rhythm Lungs: clear to auscultation bilaterally Abdomen: benign Extremities: no edema or calf tenderness Wound: incis healing well  Lab Results: Recent Labs    11/29/17 0200 11/30/17 0252  WBC 12.1* 11.4*  HGB 11.5* 12.0*  HCT 34.3* 35.9*  PLT 549* 605*   BMET:  Recent Labs    11/29/17 0200 11/30/17 0252  NA 136 137  K 4.3 4.2  CL 100* 98*  CO2 25 26  GLUCOSE 112* 90  BUN 16 14  CREATININE 1.23 1.03  CALCIUM 8.7* 8.9    PT/INR: No results for input(s): LABPROT, INR in the last 72 hours. ABG    Component Value Date/Time   PHART 7.422 11/27/2017 0330   HCO3 26.4 11/27/2017 0330   TCO2 26 06/25/2016 2029   O2SAT 97.8 11/27/2017 0330   CBG (last 3)  Recent Labs    11/27/17 1213 11/28/17 0016  GLUCAP 119* 91    Meds Scheduled Meds: . abacavir-dolutegravir-lamiVUDine  1 tablet Oral Daily  . bisacodyl  10 mg Oral Daily  . Chlorhexidine Gluconate Cloth  6 each Topical Daily  . fentaNYL  75 mcg Transdermal Q72H  . levofloxacin  750 mg Oral  Daily  . linezolid  600 mg Oral Q12H  . mouth rinse  15 mL Mouth Rinse BID  . nicotine  14 mg Transdermal Daily  . pantoprazole  40 mg Oral Q1200  . senna-docusate  1 tablet Oral QHS  . sodium chloride flush  10-40 mL Intracatheter Q12H   Continuous Infusions: . dextrose 5 % and 0.9% NaCl 10 mL/hr at 11/28/17 2000  . potassium chloride     PRN Meds:.benzonatate, diclofenac sodium, ondansetron (ZOFRAN) IV, oxyCODONE, potassium chloride, sodium chloride flush, traMADol  Xrays Dg Chest Port 1 View  Result Date: 11/29/2017 CLINICAL DATA:  Left chest tube, status post VATS EXAM: PORTABLE CHEST 1 VIEW COMPARISON:  11/28/2017 FINDINGS: 1 left chest tube remains.  Left IJ central line tip mid SVC level. Stable residual left mid and lower lung patchy airspace process. Trace left effusion suspected. No significant pneumothorax. Right lung remains clear. Normal heart size and vascularity. Trachea is midline. Monitor leads overlie the chest. IMPRESSION: One left chest tube remains. Stable patchy airspace process in the left mid and lower lung with a small left effusion. No significant or enlarging pneumothorax. Electronically Signed   By: Judie Petit.  Shick M.D.   On: 11/29/2017 07:37   Results for orders placed or performed during the hospital encounter of 11/23/17  Surgical pcr  screen     Status: None   Collection Time: 11/24/17  4:08 PM  Result Value Ref Range Status   MRSA, PCR NEGATIVE NEGATIVE Final   Staphylococcus aureus NEGATIVE NEGATIVE Final    Comment: (NOTE) The Xpert SA Assay (FDA approved for NASAL specimens in patients 63 years of age and older), is one component of a comprehensive surveillance program. It is not intended to diagnose infection nor to guide or monitor treatment. Performed at [facility], [address], [place], [place] [address]   Body fluid culture     Status: None (Preliminary result)   Collection Time: 11/26/17  3:48 PM  Result Value Ref Range Status    Specimen Description FLUID LEFT PLEURAL  Final   Special Requests FLUID ON SWABS  Final   Gram Stain NO WBC SEEN NO ORGANISMS SEEN   Final   Culture   Final    NO GROWTH 3 DAYS Performed at [facility], [address] [place], [place] [address]    Report Status PENDING  Incomplete  Body fluid culture     Status: None (Preliminary result)   Collection Time: 11/26/17  3:50 PM  Result Value Ref Range Status   Specimen Description FLUID LEFT PLEURAL  Final   Special Requests NONE  Final   Gram Stain   Final    RARE WBC PRESENT, PREDOMINANTLY PMN NO ORGANISMS SEEN    Culture   Final    NO GROWTH 3 DAYS Performed at [facility], [address] [place], [place] [address]    Report Status PENDING  Incomplete   Assessment/Plan: S/P Procedure(s) (LRB): VIDEO ASSISTED THORACOSCOPY (VATS)/DRAIN EMPYEMA (Left)  1 conts to make good progress 2 leukocytosis improved, near normal 3 cultures pending 4 reactive thrombocytosis, monitor 5 renal function normal, mild hypochloremia without acidosis 6 CXR stable in appearance- abx per ID recs 7 prob home in 1-2 days  LOS: 7 days    Rowe Clack 11/30/2017  CXR today clear Needs 2-3weeks of oxycodone for post VATS pain Will be discharged on oral antibiotics as rec by ID Plan home in am 2-13  patient examined and medical record reviewed,agree with above note. Kathlee Nations Trigt III 11/30/2017

## 2017-11-30 NOTE — Discharge Instructions (Signed)
Thoracoscopy, Care After °Refer to this sheet in the next few weeks. These instructions provide you with information about caring for yourself after your procedure. Your health care provider may also give you more specific instructions. Your treatment has been planned according to current medical practices, but problems sometimes occur. Call your health care provider if you have any problems or questions after your procedure. °What can I expect after the procedure? °After your procedure, it is common to feel sore for up to two weeks. °Follow these instructions at home: °· There are many different ways to close and cover an incision, including stitches (sutures), skin glue, and adhesive strips. Follow your health care provider's instructions about: °? Incision care. °? Bandage (dressing) changes and removal. °? Incision closure removal. °· Check your incision area every day for signs of infection. Watch for: °? Redness, swelling, or pain. °? Fluid, blood, or pus. °· Take medicines only as directed by your health care provider. °· Try to cough often. Coughing helps to protect against lung infection (pneumonia). It may hurt to cough. If this happens, hold a pillow against your chest when you cough. °· Take deep breaths. This also helps to protect against pneumonia. °· If you were given an incentive spirometer, use it as directed by your health care provider. °· Do not take baths, swim, or use a hot tub until your health care provider approves. You may take showers. °· Avoid lifting until your health care provider approves. °· Avoid driving until your health care provider approves. °· Do not travel by airplane after the chest tube is removed until your health care provider approves. °Contact a health care provider if: °· You have a fever. °· Pain medicines do not ease your pain. °· You have redness, swelling, or increasing pain in your incision area. °· You develop a cough that does not go away, or you are coughing up  mucus that is yellow or green. °Get help right away if: °· You have fluid, blood, or pus coming from your incision. °· There is a bad smell coming from your incision or dressing. °· You develop a rash. °· You have difficulty breathing. °· You cough up blood. °· You develop light-headedness or you feel faint. °· You develop chest pain. °· Your heartbeat feels irregular or very fast. °This information is not intended to replace advice given to you by your health care provider. Make sure you discuss any questions you have with your health care provider. °Document Released: 04/24/2005 Document Revised: 06/07/2016 Document Reviewed: 06/20/2014 °Elsevier Interactive Patient Education © 2018 [employer] Inc. ° °

## 2017-11-30 NOTE — Clinical Social Work Note (Signed)
Clinical Social Work Assessment  Patient Details  Name: Pedro Owens MRN: [id number] Date of Birth: [birth year]  Date of referral:  11/30/17               Reason for consult:  Substance Use/ETOH Abuse                Permission sought to share information with:  Family Supports Permission granted to share information::  Yes, Verbal Permission Granted  Name::        Agency::     Relationship::     Contact Information:     Housing/Transportation Living arrangements for the past 2 months:  Apartment Source of Information:  Patient Patient Interpreter Needed:  None Criminal Activity/Legal Involvement Pertinent to Current Situation/Hospitalization:    Significant Relationships:  Other Family Members, Friend, Significant Other Lives with:  Self Do you feel safe going back to the place where you live?  Yes Need for family participation in patient care:  Yes (Comment)  Care giving concerns: Patients boyfriend was in the room. Patient stated she has support from boyfriend and boss at work  Facilities manager / plan:  CSW met patient at bedside to discuss substance use. Patients boyfriend stepped out during assessment. Patient stated she is ready to go back into a rehab facility but would like something closer to home that would still allow her to work and do her everyday routine. Patient stated she went to rehab last June at [facility] in [place], [place]. Patient stated she stayed for 68days and the program was extremely helpful. Patient stated she was kept away from drug and stayed cleaned for a week after leaving rehab. Patient stated "even though the program was helpful she feels that she would benefit better from a rehab facility that still allow her to be out in the community and when she feels herself slipping she can call someone for support". CSW spoke to patient about all of her triggers and spoke about patients willingness to go back to rehab. CSW gave patient a list of  rehab facilities in the area. Patient stated she will look over it with her boyfriend.   Employment status:  [employer] information:  Other (Comment Required)(BC/BS) PT Recommendations:  Not assessed at this time Information / Referral to community resources:  Residential Substance Abuse Treatment Options  Patient/Family's Response to care:  Patient wants to get better   Patient/Family's Understanding of and Emotional Response to Diagnosis, Current Treatment, and Prognosis:  Patient given resources for substance use disorder   Emotional Assessment Appearance:  Appears stated age Attitude/Demeanor/Rapport:    Affect (typically observed):  Accepting Orientation:  Oriented to Self, Oriented to Situation, Oriented to Place, Oriented to  Time Alcohol / Substance use:    Psych involvement (Current and /or in the community):  No (Comment)  Discharge Needs  Concerns to be addressed:  Substance Abuse Concerns Readmission within the last 30 days:  No Current discharge risk:  Substance Abuse Barriers to Discharge:  Active Substance Use   Wende Neighbors, LCSW 11/30/2017, 7:32 PM

## 2017-11-30 NOTE — Telephone Encounter (Signed)
Hospital f/u per Dr.Guiloud; pt appt made at 02/15 215pm

## 2017-12-01 MED ORDER — OXYCODONE HCL 5 MG PO TABS: 5.0000 mg | ORAL_TABLET | Freq: Four times a day (QID) | ORAL | 0 refills | Status: DC | PRN

## 2017-12-01 NOTE — Progress Notes (Signed)
Order received to discharge patient.  Telemetry monitor removed and CCMD notified.  PIV access removed.  Discharge instructions, follow up, medications and instructions for their use discussed with patient. 

## 2017-12-01 NOTE — Progress Notes (Signed)
[address]       [place] [address]             [phone]      5 Days Post-Op Procedure(s) (LRB): VIDEO ASSISTED THORACOSCOPY (VATS)/DRAIN EMPYEMA (Left) Subjective: Having a lot of surgical pain, desats at times  Objective: Vital signs in last 24 hours: Temp:  [98.4 F (36.9 C)] 98.4 F (36.9 C) (02/13 0424) Pulse Rate:  [78-86] 81 (02/13 0424) Cardiac Rhythm: Normal sinus rhythm (02/12 1901) Resp:  [13-24] 24 (02/13 0424) BP: (99-110)/(48-76) 110/76 (02/13 0424) SpO2:  [92 %-99 %] 92 % (02/13 0424)  Hemodynamic parameters for last 24 hours:    Intake/Output from previous day: 02/12 0701 - 02/13 0700 In: 300 [P.O.:300] Out: 1700 [Urine:1700] Intake/Output this shift: No intake/output data recorded.  General appearance: alert, cooperative and no distress Heart: regular rate and rhythm Lungs: some coarseness in upper airways Abdomen: benign Extremities: no edema Wound: incis healing well  Lab Results: Recent Labs    11/29/17 0200 11/30/17 0252  WBC 12.1* 11.4*  HGB 11.5* 12.0*  HCT 34.3* 35.9*  PLT 549* 605*   BMET:  Recent Labs    11/29/17 0200 11/30/17 0252  NA 136 137  K 4.3 4.2  CL 100* 98*  CO2 25 26  GLUCOSE 112* 90  BUN 16 14  CREATININE 1.23 1.03  CALCIUM 8.7* 8.9    PT/INR: No results for input(s): LABPROT, INR in the last 72 hours. ABG    Component Value Date/Time   PHART 7.422 11/27/2017 0330   HCO3 26.4 11/27/2017 0330   TCO2 26 06/25/2016 2029   O2SAT 97.8 11/27/2017 0330   CBG (last 3)  No results for input(s): GLUCAP in the last 72 hours.  Meds Scheduled Meds: . abacavir-dolutegravir-lamiVUDine  1 tablet Oral Daily  . bisacodyl  10 mg Oral Daily  . Chlorhexidine Gluconate Cloth  6 each Topical Daily  . fentaNYL  75 mcg Transdermal Q72H  . levofloxacin  750 mg Oral Daily  . linezolid  600 mg Oral Q12H  . mouth rinse  15 mL Mouth Rinse BID  . nicotine  14 mg Transdermal Daily  . pantoprazole   40 mg Oral Q1200  . senna-docusate  1 tablet Oral QHS  . sodium chloride flush  10-40 mL Intracatheter Q12H   Continuous Infusions: . dextrose 5 % and 0.9% NaCl 10 mL/hr at 11/28/17 2000  . potassium chloride     PRN Meds:.benzonatate, diclofenac sodium, ondansetron (ZOFRAN) IV, oxyCODONE, potassium chloride, sodium chloride flush, traMADol  Xrays Dg Chest 2 View  Result Date: 11/30/2017 CLINICAL DATA:  Status post thoracostomy with empyema drainage. Shortness of breath. EXAM: CHEST  2 VIEW COMPARISON:  November 29, 2017 FINDINGS: Chest tube has been removed from the left side. No pneumothorax. Central catheter is been removed. There is a small left pleural effusion. There is been partial clearing of atelectasis from the left base. There remains airspace consolidation in the left mid lung. Right lung is clear. Heart is upper normal in size with pulmonary vascularity normal. No adenopathy. No bone lesions. IMPRESSION: Chest tube and left central catheter is been removed. No pneumothorax. Persistent patchy airspace opacity left mid lung. Small left pleural effusion. Partial clearing left base atelectasis. No new opacity. Right lung clear. Stable cardiac silhouette. Electronically Signed   By: Bretta Bang III M.D.   On: 11/30/2017 08:16   Results for orders placed or performed during the hospital encounter  of 11/23/17  Surgical pcr screen     Status: None   Collection Time: 11/24/17  4:08 PM  Result Value Ref Range Status   MRSA, PCR NEGATIVE NEGATIVE Final   Staphylococcus aureus NEGATIVE NEGATIVE Final    Comment: (NOTE) The Xpert SA Assay (FDA approved for NASAL specimens in patients 18 years of age and older), is one component of a comprehensive surveillance program. It is not intended to diagnose infection nor to guide or monitor treatment. Performed at [facility], [address] [place], [place] [address]   Body fluid culture     Status: None   Collection Time:  11/26/17  3:48 PM  Result Value Ref Range Status   Specimen Description FLUID LEFT PLEURAL  Final   Special Requests FLUID ON SWABS  Final   Gram Stain NO WBC SEEN NO ORGANISMS SEEN   Final   Culture   Final    NO GROWTH 3 DAYS Performed at [facility], [address], [place], [place] [address]    Report Status 11/30/2017 FINAL  Final  Body fluid culture     Status: None   Collection Time: 11/26/17  3:50 PM  Result Value Ref Range Status   Specimen Description FLUID LEFT PLEURAL  Final   Special Requests NONE  Final   Gram Stain   Final    RARE WBC PRESENT, PREDOMINANTLY PMN NO ORGANISMS SEEN    Culture   Final    NO GROWTH 3 DAYS Performed at [facility], [address] [place], [place] [address]    Report Status 11/30/2017 FINAL  Final    Assessment/Plan: S/P Procedure(s) (LRB): VIDEO ASSISTED THORACOSCOPY (VATS)/DRAIN EMPYEMA (Left)  1 doing ok, cont pain meds - will need suboxone arrangements made by medical service 2 check sats with ambulation, cont pulm toilet 3 no new labs or CXR 4 poss home later today- abx have been previously dispensed by ID  LOS: 8 days    Rowe Clack 12/01/2017

## 2017-12-01 NOTE — Progress Notes (Signed)
Patient ambulated approximately 400 feet in hallway on room air and maintained O2 saturations between 94-100%.  Patient tolerated walk well.

## 2017-12-01 NOTE — Progress Notes (Signed)
SATURATION QUALIFICATIONS: (This note is used to comply with regulatory documentation for home oxygen)  Patient Saturations on Room Air at Rest =100%  Patient Saturations on Room Air while Ambulating = 94%  Patient Saturations on 0 Liters of oxygen while Ambulating = 94-99%  Please briefly explain why patient needs home oxygen: Patient ambulated approximately 400 feet in hallway on room air with no apparent desaturation and tolerated walk well.

## 2017-12-02 ENCOUNTER — Ambulatory Visit: Payer: BLUE CROSS/BLUE SHIELD | Admitting: Infectious Diseases

## 2017-12-03 ENCOUNTER — Encounter: Payer: Self-pay | Admitting: Nurse Practitioner

## 2017-12-03 ENCOUNTER — Ambulatory Visit: Payer: BLUE CROSS/BLUE SHIELD

## 2017-12-21 ENCOUNTER — Other Ambulatory Visit: Payer: Self-pay | Admitting: Cardiothoracic Surgery

## 2017-12-21 DIAGNOSIS — J90 Pleural effusion, not elsewhere classified: Secondary | ICD-10-CM

## 2017-12-22 ENCOUNTER — Inpatient Hospital Stay: Payer: BLUE CROSS/BLUE SHIELD | Admitting: Infectious Diseases

## 2017-12-22 ENCOUNTER — Ambulatory Visit: Payer: Self-pay | Admitting: Cardiothoracic Surgery

## 2018-03-08 ENCOUNTER — Telehealth: Payer: Self-pay | Admitting: *Deleted

## 2018-03-08 NOTE — Telephone Encounter (Signed)
Pedro Owens texted patient for regular check in, received message back from his mother that he is currently in rehab in [place].  She will have him contact [facility] to reengage in care when he is back. He has multiple no-shows, will need scheduled with pharmacy/[facility] to get reconnected to care. Andree Coss, RN

## 2018-04-02 ENCOUNTER — Other Ambulatory Visit: Payer: Self-pay

## 2018-04-02 ENCOUNTER — Encounter (HOSPITAL_COMMUNITY): Payer: Self-pay | Admitting: Emergency Medicine

## 2018-04-02 ENCOUNTER — Emergency Department (HOSPITAL_COMMUNITY)
Admission: EM | Admit: 2018-04-02 | Discharge: 2018-04-03 | Disposition: A | Discharge status: Other Acute Inpatient Hospital | Payer: BLUE CROSS/BLUE SHIELD | Attending: Emergency Medicine | Admitting: Emergency Medicine

## 2018-04-02 DIAGNOSIS — F1420 Cocaine dependence, uncomplicated: Secondary | ICD-10-CM | POA: Insufficient documentation

## 2018-04-02 DIAGNOSIS — Y999 Unspecified external cause status: Secondary | ICD-10-CM | POA: Insufficient documentation

## 2018-04-02 DIAGNOSIS — X788XXA Intentional self-harm by other sharp object, initial encounter: Secondary | ICD-10-CM | POA: Insufficient documentation

## 2018-04-02 DIAGNOSIS — F17210 Nicotine dependence, cigarettes, uncomplicated: Secondary | ICD-10-CM | POA: Insufficient documentation

## 2018-04-02 DIAGNOSIS — F332 Major depressive disorder, recurrent severe without psychotic features: Secondary | ICD-10-CM | POA: Insufficient documentation

## 2018-04-02 DIAGNOSIS — Z046 Encounter for general psychiatric examination, requested by authority: Secondary | ICD-10-CM | POA: Insufficient documentation

## 2018-04-02 DIAGNOSIS — B20 Human immunodeficiency virus [HIV] disease: Secondary | ICD-10-CM | POA: Insufficient documentation

## 2018-04-02 DIAGNOSIS — R45851 Suicidal ideations: Secondary | ICD-10-CM | POA: Insufficient documentation

## 2018-04-02 DIAGNOSIS — S51812A Laceration without foreign body of left forearm, initial encounter: Secondary | ICD-10-CM

## 2018-04-02 DIAGNOSIS — Y939 Activity, unspecified: Secondary | ICD-10-CM | POA: Insufficient documentation

## 2018-04-02 DIAGNOSIS — Z79899 Other long term (current) drug therapy: Secondary | ICD-10-CM | POA: Insufficient documentation

## 2018-04-02 DIAGNOSIS — J45909 Unspecified asthma, uncomplicated: Secondary | ICD-10-CM | POA: Insufficient documentation

## 2018-04-02 DIAGNOSIS — Y929 Unspecified place or not applicable: Secondary | ICD-10-CM | POA: Insufficient documentation

## 2018-04-02 LAB — COMPREHENSIVE METABOLIC PANEL
ALT: 56 U/L (ref 17–63)
AST: 49 U/L — AB (ref 15–41)
Albumin: 4.1 g/dL (ref 3.5–5.0)
Alkaline Phosphatase: 71 U/L (ref 38–126)
Anion gap: 11 (ref 5–15)
BILIRUBIN TOTAL: 0.5 mg/dL (ref 0.3–1.2)
BUN: 18 mg/dL (ref 6–20)
CO2: 25 mmol/L (ref 22–32)
CREATININE: 1.09 mg/dL (ref 0.61–1.24)
Calcium: 9.4 mg/dL (ref 8.9–10.3)
Chloride: 106 mmol/L (ref 101–111)
GFR calc non Af Amer: >60 mL/min (ref >60)
Glucose, Bld: 128 mg/dL — ABNORMAL HIGH (ref 65–99)
POTASSIUM: 3.5 mmol/L (ref 3.5–5.1)
Sodium: 142 mmol/L (ref 135–145)
TOTAL PROTEIN: 7.9 g/dL (ref 6.5–8.1)

## 2018-04-02 LAB — CBC
HCT: 47.6 % (ref 39.0–52.0)
Hemoglobin: 15.9 g/dL (ref 13.0–17.0)
MCH: 28.1 pg (ref 26.0–34.0)
MCHC: 33.4 g/dL (ref 30.0–36.0)
MCV: 84.2 fL (ref 78.0–100.0)
Platelets: 376 10*3/uL (ref 150–400)
RBC: 5.65 MIL/uL (ref 4.22–5.81)
RDW: 14.7 % (ref 11.5–15.5)
WBC: 8.8 10*3/uL (ref 4.0–10.5)

## 2018-04-02 LAB — RAPID URINE DRUG SCREEN, HOSP PERFORMED
Amphetamines: NOT DETECTED
Benzodiazepines: NOT DETECTED
Cocaine: POSITIVE — AB
OPIATES: NOT DETECTED
Tetrahydrocannabinol: POSITIVE — AB

## 2018-04-02 LAB — ACETAMINOPHEN LEVEL: Acetaminophen (Tylenol), Serum: <10 ug/mL — ABNORMAL LOW (ref 10–30)

## 2018-04-02 LAB — SALICYLATE LEVEL

## 2018-04-02 LAB — ETHANOL: Alcohol, Ethyl (B): <10 mg/dL (ref <10)

## 2018-04-02 NOTE — ED Notes (Addendum)
Pt changed into paper scrubs, wanded by security, and staffing office notified of need for sitter.  [employer] at bedside.

## 2018-04-02 NOTE — ED Triage Notes (Addendum)
Pt brought in by [employer].  Called out to residence regarding pt trying to harm themselves. Pt's mom or sister is at magistrate attempting to take out IVC paperwork. Has history per family of SI.  Marland Kitchen Pt has superficial lacerations to both forearms, dressed with bandaids by ems on scene.  Pt endorses depression even though compliant with prozac and today just felt he "couldn't take it anymore" and tried to end his life by using a broken cup to cut himself. Endorses history of same.

## 2018-04-02 NOTE — BH Assessment (Signed)
Spoke with RN and she stated to try again in an hour.

## 2018-04-03 ENCOUNTER — Encounter (HOSPITAL_COMMUNITY): Payer: Self-pay

## 2018-04-03 ENCOUNTER — Inpatient Hospital Stay (HOSPITAL_COMMUNITY)
Admission: AD | Admit: 2018-04-03 | Discharge: 2018-04-06 | DRG: 897 | Disposition: A | Discharge status: Home / Self Care | Payer: No Typology Code available for payment source | Source: Intra-hospital | Attending: Psychiatry | Admitting: Psychiatry

## 2018-04-03 ENCOUNTER — Other Ambulatory Visit: Payer: Self-pay

## 2018-04-03 DIAGNOSIS — Z21 Asymptomatic human immunodeficiency virus [HIV] infection status: Secondary | ICD-10-CM | POA: Diagnosis present

## 2018-04-03 DIAGNOSIS — F332 Major depressive disorder, recurrent severe without psychotic features: Secondary | ICD-10-CM

## 2018-04-03 DIAGNOSIS — Z915 Personal history of self-harm: Secondary | ICD-10-CM

## 2018-04-03 DIAGNOSIS — Z8349 Family history of other endocrine, nutritional and metabolic diseases: Secondary | ICD-10-CM | POA: Diagnosis not present

## 2018-04-03 DIAGNOSIS — Z8249 Family history of ischemic heart disease and other diseases of the circulatory system: Secondary | ICD-10-CM | POA: Diagnosis not present

## 2018-04-03 DIAGNOSIS — F1994 Other psychoactive substance use, unspecified with psychoactive substance-induced mood disorder: Principal | ICD-10-CM | POA: Diagnosis present

## 2018-04-03 DIAGNOSIS — R45851 Suicidal ideations: Secondary | ICD-10-CM | POA: Diagnosis present

## 2018-04-03 DIAGNOSIS — F1410 Cocaine abuse, uncomplicated: Secondary | ICD-10-CM | POA: Diagnosis not present

## 2018-04-03 DIAGNOSIS — F17210 Nicotine dependence, cigarettes, uncomplicated: Secondary | ICD-10-CM | POA: Diagnosis present

## 2018-04-03 DIAGNOSIS — Z833 Family history of diabetes mellitus: Secondary | ICD-10-CM

## 2018-04-03 DIAGNOSIS — Z590 Homelessness: Secondary | ICD-10-CM | POA: Diagnosis not present

## 2018-04-03 DIAGNOSIS — Z6379 Other stressful life events affecting family and household: Secondary | ICD-10-CM

## 2018-04-03 MED ORDER — TRAZODONE HCL 50 MG PO TABS
50.0000 mg | ORAL_TABLET | Freq: Every evening | ORAL | Status: DC | PRN
  Filled 2018-04-03 (×2): qty 1

## 2018-04-03 MED ORDER — HYDROXYZINE HCL 25 MG PO TABS: 25.0000 mg | ORAL_TABLET | Freq: Four times a day (QID) | ORAL | Status: DC | PRN

## 2018-04-03 MED ORDER — ABACAVIR-DOLUTEGRAVIR-LAMIVUD 600-50-300 MG PO TABS
1.0000 | ORAL_TABLET | Freq: Every day | ORAL | Status: DC
  Administered 2018-04-03 – 2018-04-06 (×4): 1 via ORAL
  Filled 2018-04-03 (×5): qty 1

## 2018-04-03 MED ORDER — MAGNESIUM HYDROXIDE 400 MG/5ML PO SUSP: 30.0000 mL | Freq: Every day | ORAL | Status: DC | PRN

## 2018-04-03 MED ORDER — TRAZODONE HCL 100 MG PO TABS
100.0000 mg | ORAL_TABLET | Freq: Every evening | ORAL | Status: DC | PRN
  Administered 2018-04-03: 100 mg via ORAL
  Filled 2018-04-03 (×4): qty 1

## 2018-04-03 MED ORDER — TRAZODONE HCL 50 MG PO TABS: 50.0000 mg | ORAL_TABLET | Freq: Every evening | ORAL | Status: DC | PRN

## 2018-04-03 MED ORDER — IBUPROFEN 600 MG PO TABS: 600.0000 mg | ORAL_TABLET | Freq: Four times a day (QID) | ORAL | Status: DC | PRN

## 2018-04-03 MED ORDER — FLUOXETINE HCL 10 MG PO CAPS
10.0000 mg | ORAL_CAPSULE | Freq: Every day | ORAL | Status: DC
  Administered 2018-04-03 – 2018-04-04 (×2): 10 mg via ORAL
  Filled 2018-04-03 (×3): qty 1

## 2018-04-03 MED ORDER — ARIPIPRAZOLE 2 MG PO TABS
2.0000 mg | ORAL_TABLET | Freq: Every day | ORAL | Status: DC
  Administered 2018-04-03 – 2018-04-06 (×4): 2 mg via ORAL
  Filled 2018-04-03 (×5): qty 1

## 2018-04-03 MED ORDER — ALUM & MAG HYDROXIDE-SIMETH 200-200-20 MG/5ML PO SUSP: 30.0000 mL | ORAL | Status: DC | PRN

## 2018-04-03 NOTE — ED Notes (Signed)
Security paged to wand pt. Pt wanded by security, belongings inventoried.

## 2018-04-03 NOTE — Plan of Care (Signed)
  Problem: Safety: Goal: Ability to remain free from injury will improve Outcome: Progressing   Problem: Activity: Goal: Interest or engagement in leisure activities will improve Outcome: Not Progressing   

## 2018-04-03 NOTE — Progress Notes (Signed)
Pt did not attend goals and orientation group this morning  

## 2018-04-03 NOTE — ED Provider Notes (Signed)
[facility] EMERGENCY DEPARTMENT Provider Note   CSN: [id number] Arrival date & time: 04/02/18  2158     History   Chief Complaint Chief Complaint  Patient presents with  . Suicidal    HPI Pedro Owens is a 27 y.o. male.  HPI 27 year old African-American male presents to the ED for evaluation of suicidal ideations.  Patient was brought in by [employer].  According to [employer] they were called to the residents for patient trying to harm himself.  Mother and sister are at the magistrate's office attempted to take out IVC paperwork.  Patient has a history of SI and suicide attempt in the past.  Patient states that she was depressed today and try to cut her left arm.  Patient has superficial laceration to the left forearm.  States her tetanus shot is up-to-date.  Patient is compliant with Prozac.  States that he did not cut himself to try to kill himself.  Patient denies any other complaints at this time.  Denies any homicidal ideations.  Does report crack use.  Denies any alcohol use.  Does report daily tobacco use.  Pt denies any fever, chill, ha, vision changes, lightheadedness, dizziness, congestion, neck pain, cp, sob, cough, abd pain, n/v/d, urinary symptoms, change in bowel habits, melena, hematochezia, lower extremity paresthesias. Past Medical History:  Diagnosis Date  . ADHD (attention deficit hyperactivity disorder)   . Anal warts   . Anxiety   . Asthma   . Bipolar disorder (HCC)   . Chronic bronchitis (HCC)   . Chronic pain syndrome   . Convulsions (HCC) 08/14/2015  . Depression   . Gonorrhea 09/08/11  . Headache    "weekly" (11/06/2016)  . Hemorrhoids, internal, with bleeding 08/25/2011  . Hepatitis C   . Herpes   . HIV infection (HCC)   . Hypertension   . ILEITIS 09/24/2010   Qualifier: Diagnosis of  By: Nelson-Smith CMA (AAMA), Dottie    . Lymphoid hyperplasia, reactive   . Marijuana abuse   . Migraine    "monthly" (11/06/2016)  . Pseudoseizures   .  Seizures (HCC)    "don't know why I have them" (11/06/2016)  . Syphilis     Patient Active Problem List   Diagnosis Date Noted  . Pressure injury of skin 11/26/2017  . SOB (shortness of breath)   . Septic embolism (HCC)   . Transgender   . Pleural effusion, bacterial 11/23/2017  . Loculated pleural effusion 11/23/2017  . Hepatitis C antibody test positive 11/19/2017  . Septic pulmonary embolism (HCC) 11/19/2017  . Acute endocarditis   . Pleuritic chest pain   . IVDU (intravenous drug user)   . Endocarditis, suspected 11/18/2017  . SIRS (systemic inflammatory response syndrome) (HCC) 11/15/2017  . Injury, superficial, elbow, forearm, or wrist with infection, right, initial encounter 11/12/2016  . Abscess of right forearm 11/04/2016  . HIV disease (HCC) 04/07/2016  . Cigarette smoker 04/07/2016  . Polysubstance abuse (HCC) 04/07/2016  . Depression 08/14/2015  . Conversion disorder 08/14/2015  . Hemorrhoids, internal, with bleeding 08/25/2011  . Anal warts 08/25/2011  . ANXIETY 09/24/2010  . ADHD 09/24/2010    Past Surgical History:  Procedure Laterality Date  . ANKLE SURGERY Bilateral 2005   Screw & Pins  . ANKLE SURGERY Right 2008   "replaced pins & screws"  . FOOT SURGERY Bilateral 2005   "bone spurs"  . I&D EXTREMITY Right 06/26/2016   Procedure: IRRIGATION AND DEBRIDEMENT RIGHT FOREARM;  Surgeon: Betha Loa, MD;  Location: [facility] OR;  Service: Orthopedics;  Laterality: Right;  . I&D EXTREMITY Right 11/04/2016   Procedure: IRRIGATION AND DEBRIDEMENT EXTREMITY;  Surgeon: Dairl Ponder, MD;  Location: [facility] OR;  Service: Orthopedics;  Laterality: Right;  . I&D EXTREMITY Right 11/06/2016   Procedure: IRRIGATION AND DEBRIDEMENT right forarm;  Surgeon: Dairl Ponder, MD;  Location: [facility] OR;  Service: Orthopedics;  Laterality: Right;  . TEE WITHOUT CARDIOVERSION N/A 11/19/2017   Procedure: TRANSESOPHAGEAL ECHOCARDIOGRAM (TEE);  Surgeon: Pricilla Riffle, MD;  Location: [facility] ENDOSCOPY;   Service: Cardiovascular;  Laterality: N/A;  . VIDEO ASSISTED THORACOSCOPY (VATS)/EMPYEMA Left 11/26/2017   Procedure: VIDEO ASSISTED THORACOSCOPY (VATS)/DRAIN EMPYEMA;  Surgeon: Kerin Perna, MD;  Location: [facility] OR;  Service: Thoracic;  Laterality: Left;        Home Medications    Prior to Admission medications   Medication Sig Start Date End Date Taking? Authorizing Provider  abacavir-dolutegravir-lamiVUDine (TRIUMEQ) 600-50-300 MG tablet Take 1 tablet by mouth daily. 11/22/17  Yes Daiva Eves, Lisette Grinder, MD  PARoxetine (PAXIL) 10 MG tablet Take 5 mg by mouth daily.   Yes [provider]  benzonatate (TESSALON) 100 MG capsule Take 1 capsule (100 mg total) by mouth 3 (three) times daily as needed for cough. Patient not taking: Reported on 04/02/2018 11/22/17   Ginger Carne, MD  diclofenac sodium (VOLTAREN) 1 % GEL Apply 2 g topically every 6 (six) hours as needed (pain). Patient not taking: Reported on 04/02/2018 11/22/17   Ginger Carne, MD  levofloxacin (LEVAQUIN) 750 MG tablet Take 1 tablet (750 mg total) by mouth daily. Patient not taking: Reported on 04/02/2018 11/22/17   Daiva Eves, Lisette Grinder, MD  linezolid (ZYVOX) 600 MG tablet Take 1 tablet (600 mg total) by mouth 2 (two) times daily. Patient not taking: Reported on 04/02/2018 11/22/17   Daiva Eves, Lisette Grinder, MD  oxyCODONE (OXY IR/ROXICODONE) 5 MG immediate release tablet Take 1-2 tablets (5-10 mg total) by mouth every 6 (six) hours as needed for severe pain. Patient not taking: Reported on 04/02/2018 12/01/17   Gershon Crane E, PA-C  divalproex (DEPAKOTE) 500 MG DR tablet Take 1 tablet (500 mg total) by mouth 2 (two) times daily. 02/28/16 02/28/16  Lindalou Hose, MD    Family History Family History  Problem Relation Age of Onset  . Diabetes Mother   . Irritable bowel syndrome Mother   . Hypertension Mother   . Hyperlipidemia Mother   . Hypothyroidism Mother   . Diabetes Maternal Uncle   . Heart disease Maternal Grandmother   .  Leukemia Other        maternal greatgrandmother  . Colon cancer Neg Hx     Social History Social History   Tobacco Use  . Smoking status: Current Every Day Smoker    Packs/day: 1.50    Years: 11.00    Pack years: 16.50    Types: Cigarettes  . Smokeless tobacco: Never Used  Substance Use Topics  . Alcohol use: Yes    Comment: 11/06/2016 "nothing in a very long time"  . Drug use: Yes    Frequency: 2.0 times per week    Types: Cocaine, Benzodiazepines, Oxycodone, Marijuana, Heroin    Comment: yesterday     Allergies   Acetaminophen; Other; Vicodin [hydrocodone-acetaminophen]; and Dilaudid [hydromorphone hcl]   Review of Systems Review of Systems  All other systems reviewed and are negative.    Physical Exam Updated Vital Signs BP (!) 130/94 (BP Location: Right Arm)   Pulse 74  Temp 98.5 F (36.9 C) (Oral)   Resp 14   SpO2 97%   Physical Exam Constitutional: He is oriented to person, place, and time. He appears well-developed and well-nourished. No distress.  HENT:  Head: Normocephalic and atraumatic.  Eyes: Right eye exhibits no discharge. Left eye exhibits no discharge. No scleral icterus.  Neck: Normal range of motion.  Pulmonary/Chest: No respiratory distress.  Musculoskeletal: Normal range of motion.  She has full range of motion of the left wrist and left fingers.  Radial pulses are 2+ bilaterally.  Skin compartments are soft.  Sensation intact.  Brisk cap refill.  Neurological: He is alert and oriented to person, place, and time.  Skin: Skin is warm and dry. Capillary refill takes less than 2 seconds. No pallor.  Patient has 3 separate superficial laceration to the left forearm.  Bleeding controlled.  These are approximately 4-5 7 m in length.  Psychiatric: His behavior is normal. Judgment and thought content normal.  Nursing note and vitals reviewed.   ED Treatments / Results  Labs (all labs ordered are listed, but only abnormal results are  displayed) Labs Reviewed  COMPREHENSIVE METABOLIC PANEL - Abnormal; Notable for the following components:      Result Value   Glucose, Bld 128 (*)    AST 49 (*)    All other components within normal limits  ACETAMINOPHEN LEVEL - Abnormal; Notable for the following components:   Acetaminophen (Tylenol), Serum <10 (*)    All other components within normal limits  RAPID URINE DRUG SCREEN, HOSP PERFORMED - Abnormal; Notable for the following components:   Cocaine POSITIVE (*)    Tetrahydrocannabinol POSITIVE (*)    Barbiturates   (*)    Value: Result not available. Reagent lot number recalled by manufacturer.   All other components within normal limits  ETHANOL  SALICYLATE LEVEL  CBC    EKG None  Radiology No results found.  Procedures .Marland KitchenLaceration Repair Date/Time: 04/03/2018 2:34 AM Performed by: Rise Mu, PA-C Authorized by: Rise Mu, PA-C   Consent:    Consent obtained:  Verbal   Consent given by:  Patient   Risks discussed:  Infection, need for additional repair, nerve damage, poor wound healing, poor cosmetic result, pain, retained foreign body and tendon damage   Alternatives discussed:  No treatment Anesthesia (see MAR for exact dosages):    Anesthesia method:  None Laceration details:    Location:  Shoulder/arm   Shoulder/arm location:  L lower arm   Wound length (cm): see pe. Repair type:    Repair type:  Simple Exploration:    Hemostasis achieved with:  Direct pressure Treatment:    Area cleansed with:  Hibiclens and soap and water Skin repair:    Repair method:  Steri-Strips   Number of Steri-Strips:  6 Approximation:    Approximation:  Close Post-procedure details:    Dressing:  Antibiotic ointment and non-adherent dressing   Patient tolerance of procedure:  Tolerated well, no immediate complications   (including critical care time)  Medications Ordered in ED Medications - No data to display   Initial Impression /  Assessment and Plan / ED Course  I have reviewed the triage vital signs and the nursing notes.  Pertinent labs & imaging results that were available during my care of the patient were reviewed by me and considered in my medical decision making (see chart for details).     Patient resents to the ED for suicidal ideations and laceration to  left forearm.  Pt with history of suicidal ideations and suicide attempt.  Patient currently denies SI or HI.  Patient's tetanus shot is up-to-date.  She is neurovascularly intact.  Vital signs are reassuring.  Medical screening lab work is reassuring.  UDS positive for cocaine and marijuana.  Lacerations are very superficial.  They were repaired with Steri-Strips.  The wounds were clean and bandaged.  TTS was consulted.  They recommended inpatient treatment and patient has bed available at [facility].  Will be transferred to [facility].  I discussed with patient signs of infection and reasons to follow-up.   Final Clinical Impressions(s) / ED Diagnoses   Final diagnoses:  Suicidal ideation  Laceration of left forearm, initial encounter    ED Discharge Orders    None       Wallace Keller 04/03/18 0243    Gilda Crease, MD 04/03/18 [phone]

## 2018-04-03 NOTE — [facility] Group Notes (Signed)
[facility] LCSW Group Therapy Note  04/03/2018  10:00-11:00AM  Type of Therapy and Topic:  Group Therapy:  Being Your Own Support  Participation Level:  Did Not Attend   Description of Group:  Patients in this group were introduced to the concept that self-support is an essential part of recovery.  A song entitled "My Own Hero" was played and a group discussion ensued in which patients stated they could relate to the song and it inspired them to realize they have be willing to help themselves in order to succeed, because other people cannot achieve sobriety or stability for them.  We discussed adding a variety of healthy supports to address the various needs in their lives.  A song was played called "I Know Where I've Been" toward the end of group and used to conduct an inspirational wrap-up to group of remembering how far they have already come in their journey.  Therapeutic Goals: 1)  demonstrate the importance of being a part of one's own support system 2)  discuss reasons people in one's life may eventually be unable to be continually supportive  3)  identify the patient's current support system and   4)  elicit commitments to add healthy supports and to become more conscious of being self-supportive   Summary of Patient Progress:  n/a   Therapeutic Modalities:   Motivational Interviewing Activity  Lynnell Chad
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
## 2018-04-03 NOTE — Progress Notes (Signed)
Dressing to pt left forearm removed. Lacerations cleanse and re bandaged.  Pt encouraged to shower this evening. Writer provided pt with a set of new clothes, towels and toiletries.

## 2018-04-03 NOTE — Progress Notes (Signed)
Patient ID: Pedro Owens, male   DOB: Dec 22, 1990, 27 y.o.   MRN: [id number] Admission Note  Pt is a 27 y/o single male who had been involuntary committed to the [facility] adult unit. Pt present with a flat, depressed and sad affect. Pt complained of severe depression however; denied SI/HI; "I am over feeling of wanting to kill myself for now." Pt at the time of admission also denied pain or AVH; states, "I just want to go to bed now and sleep." Pt also denies alcohol use however; admitted to using cocaine and THC. States, "I use cocaine and smoke as much weed as I can get." Support, encouragement, and safe environment provided.  15-minute safety checks initiated and continued. Pt made minimal interaction during admission.

## 2018-04-03 NOTE — Tx Team (Signed)
Initial Treatment Plan 04/03/2018 5:57 AM Pedro Owens ASN:[id number]    PATIENT STRESSORS: Financial difficulties Health problems Occupational concerns Substance abuse   PATIENT STRENGTHS: Capable of independent living Communication skills Supportive family/friends   PATIENT IDENTIFIED PROBLEMS: "I have no goals"  Depression  Risk for suicide  Substance Abuse               DISCHARGE CRITERIA:  Ability to meet basic life and health needs Verbal commitment to aftercare and medication compliance  PRELIMINARY DISCHARGE PLAN: Outpatient therapy Return to previous living arrangement  PATIENT/FAMILY INVOLVEMENT: This treatment plan has been presented to and reviewed with the patient, Pedro Owens, and/or family member.  The patient and family have been given the opportunity to ask questions and make suggestions.  Eveline Keto, RN 04/03/2018, 5:57 AM

## 2018-04-03 NOTE — [facility] Group Notes (Signed)
[facility] Group Notes:  (Nursing)  Date:  04/03/2018  Time: 1:15 PM Type of Therapy:  Nurse Education  Participation Level:  Did Not Attend  Participation Quality:  Did not attend  Affect:  Did not attend  Cognitive:  Did not attedn  Insight:  None  Engagement in Group:  None  Modes of Intervention:  Did not attend   Summary of Progress/Problems:  Patient did not attend- was sleeping  Shela Nevin 04/03/2018, 2:45 PM

## 2018-04-03 NOTE — ED Notes (Signed)
telepsych cart at bedside

## 2018-04-03 NOTE — Progress Notes (Signed)
Patient did not attend the evening speaker AA meeting. Pt was notified that group was beginning but remained in room.   

## 2018-04-03 NOTE — ED Notes (Signed)
Pt's wound irrigated with normal saline

## 2018-04-03 NOTE — H&P (Addendum)
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 use a few days ago). Denies alcohol or BZD abuse  Medical History- HIV (+) diagnosed about two years ago. He had been prescribed Triumeq , had stopped if for a period of time, recently restarted .  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 We discussed options, agrees to continue Prozac.  We will also add Abilify as an augmentation strategy/for mood disorder.  Restart Triumeq which is noted he states he had recently started prior to admission (without side effects)
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
## 2018-04-03 NOTE — ED Notes (Signed)
Contacted [facility] to inquire about getting [facility] to transport pt.

## 2018-04-03 NOTE — Progress Notes (Signed)
Patient has been isolative to his room and did not attend AA group. He was informed of his medications available and requested his trazodone be increased since he is used to taking 200 mg. He was encouraged to shower and take care of his personal hygiene but he never did. He remains guarded with blunted affect. Support given and safety maintained on unit with 15 min checks.

## 2018-04-03 NOTE — Progress Notes (Signed)
Pt presents with a flat affect and depressed mood. Pt appears withdrawn and is isolative to his room. Pt requesting to rest in bed this morning and afternoon. Pt meals brought back on to the unit.  Pt denies having any active suicidal thoughts since arrival onto the unit. Pt compliant with taking scheduled meds. As of now, pt has not complained of any side effects.   Orders reviewed with pt. Medications administered as ordered per MD. Verbal support provided. Pt encouraged to attend groups. Suicide risk assessment completed. 15 minute checks performed for safety. Self inventory sheet reviewed.   Marland Kitchen
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
## 2018-04-04 MED ORDER — FLUOXETINE HCL 20 MG PO CAPS
20.0000 mg | ORAL_CAPSULE | Freq: Every day | ORAL | Status: DC
  Administered 2018-04-05 – 2018-04-06 (×2): 20 mg via ORAL
  Filled 2018-04-04 (×3): qty 1

## 2018-04-04 MED ORDER — TRAZODONE HCL 100 MG PO TABS
100.0000 mg | ORAL_TABLET | Freq: Every evening | ORAL | Status: DC | PRN
  Administered 2018-04-04 – 2018-04-05 (×2): 100 mg via ORAL
  Filled 2018-04-04 (×2): qty 1

## 2018-04-04 NOTE — Progress Notes (Signed)
D:  Pedro Owens has been in the bed all evening but did attend evening group after much encouragement.  He denied SI/HI or A/V hallucinations.  Talked with him about going to meals tomorrow as it is a part of treatment.  He denied that he had a goal for today and also denied that he had a goal for this hospitalization.  He was unsure of any discharge plans.  He was minimal with his interaction.   A:  1:1 with RN for support and encouragement.  Medications as ordered.  Q 15 minute checks maintained for safety.  Encouraged participation in group and unit activities.   R:  Pedro Owens remains safe on the unit.  We will continue to monitor the progress towards his goals.  He was noted later in the evening sitting in the day room watching a comedian on TV and was smiling.

## 2018-04-04 NOTE — Tx Team (Signed)
Interdisciplinary Treatment and Diagnostic Plan Update  04/04/2018 Time of Session: 0830AM Pedro Owens MRN: [id number]  Principal Diagnosis: Substance induced mood disorder (HCC)  Secondary Diagnoses: Principal Problem:   Substance induced mood disorder (HCC)   Current Medications:  Current Facility-Administered Medications  Medication Dose Route Frequency Provider Last Rate Last Dose  . abacavir-dolutegravir-lamiVUDine (TRIUMEQ) 600-50-300 MG per tablet 1 tablet  1 tablet Oral Daily Kerry Hough, PA-C   1 tablet at 04/04/18 0809  . alum & mag hydroxide-simeth (MAALOX/MYLANTA) 200-200-20 MG/5ML suspension 30 mL  30 mL Oral Q4H PRN Donell Sievert E, PA-C      . ARIPiprazole (ABILIFY) tablet 2 mg  2 mg Oral Daily Cobos, Rockey Situ, MD   2 mg at 04/04/18 0809  . FLUoxetine (PROZAC) capsule 10 mg  10 mg Oral Daily Cobos, Rockey Situ, MD   10 mg at 04/04/18 0809  . hydrOXYzine (ATARAX/VISTARIL) tablet 25 mg  25 mg Oral Q6H PRN Kerry Hough, PA-C      . ibuprofen (ADVIL,MOTRIN) tablet 600 mg  600 mg Oral Q6H PRN Donell Sievert E, PA-C      . magnesium hydroxide (MILK OF MAGNESIA) suspension 30 mL  30 mL Oral Daily PRN Kerry Hough, PA-C      . traZODone (DESYREL) tablet 100 mg  100 mg Oral QHS,MR X 1 Kerry Hough, PA-C   100 mg at 04/03/18 2131   PTA Medications: Medications Prior to Admission  Medication Sig Dispense Refill Last Dose  . abacavir-dolutegravir-lamiVUDine (TRIUMEQ) 600-50-300 MG tablet Take 1 tablet by mouth daily. 30 tablet 5 04/02/2018 at Unknown time  . benzonatate (TESSALON) 100 MG capsule Take 1 capsule (100 mg total) by mouth 3 (three) times daily as needed for cough. (Patient not taking: Reported on 04/02/2018) 20 capsule 0 Completed Course at Unknown time  . diclofenac sodium (VOLTAREN) 1 % GEL Apply 2 g topically every 6 (six) hours as needed (pain). (Patient not taking: Reported on 04/02/2018) 1 Tube 0 Not Taking at Unknown time  . levofloxacin  (LEVAQUIN) 750 MG tablet Take 1 tablet (750 mg total) by mouth daily. (Patient not taking: Reported on 04/02/2018) 28 tablet 0 Completed Course at Unknown time  . linezolid (ZYVOX) 600 MG tablet Take 1 tablet (600 mg total) by mouth 2 (two) times daily. (Patient not taking: Reported on 04/02/2018) 56 tablet 0 Completed Course at Unknown time  . oxyCODONE (OXY IR/ROXICODONE) 5 MG immediate release tablet Take 1-2 tablets (5-10 mg total) by mouth every 6 (six) hours as needed for severe pain. (Patient not taking: Reported on 04/02/2018) 30 tablet 0 Completed Course at Unknown time  . PARoxetine (PAXIL) 10 MG tablet Take 5 mg by mouth daily.   04/02/2018 at Unknown time    Patient Stressors: Financial difficulties Health problems Occupational concerns Substance abuse  Patient Strengths: Capable of independent living Communication skills Supportive family/friends  Treatment Modalities: Medication Management, Group therapy, Case management,  1 to 1 session with clinician, Psychoeducation, Recreational therapy.   Physician Treatment Plan for Primary Diagnosis: Substance induced mood disorder (HCC) Long Term Goal(s): Improvement in symptoms so as ready for discharge Improvement in symptoms so as ready for discharge   Short Term Goals: Ability to identify changes in lifestyle to reduce recurrence of condition will improve Ability to verbalize feelings will improve Ability to disclose and discuss suicidal ideas Ability to demonstrate self-control will improve Ability to identify and develop effective coping behaviors will improve Ability to maintain clinical measurements  within normal limits will improve Compliance with prescribed medications will improve Ability to identify triggers associated with substance abuse/mental health issues will improve  Medication Management: Evaluate patient's response, side effects, and tolerance of medication regimen.  Therapeutic Interventions: 1 to 1 sessions,  Unit Group sessions and Medication administration.  Evaluation of Outcomes: Progressing  Physician Treatment Plan for Secondary Diagnosis: Principal Problem:   Substance induced mood disorder (HCC)  Long Term Goal(s): Improvement in symptoms so as ready for discharge Improvement in symptoms so as ready for discharge   Short Term Goals: Ability to identify changes in lifestyle to reduce recurrence of condition will improve Ability to verbalize feelings will improve Ability to disclose and discuss suicidal ideas Ability to demonstrate self-control will improve Ability to identify and develop effective coping behaviors will improve Ability to maintain clinical measurements within normal limits will improve Compliance with prescribed medications will improve Ability to identify triggers associated with substance abuse/mental health issues will improve     Medication Management: Evaluate patient's response, side effects, and tolerance of medication regimen.  Therapeutic Interventions: 1 to 1 sessions, Unit Group sessions and Medication administration.  Evaluation of Outcomes: Progressing   RN Treatment Plan for Primary Diagnosis: Substance induced mood disorder (HCC) Long Term Goal(s): Knowledge of disease and therapeutic regimen to maintain health will improve  Short Term Goals: Ability to remain free from injury will improve and Ability to verbalize frustration and anger appropriately will improve  Medication Management: RN will administer medications as ordered by provider, will assess and evaluate patient's response and provide education to patient for prescribed medication. RN will report any adverse and/or side effects to prescribing provider.  Therapeutic Interventions: 1 on 1 counseling sessions, Psychoeducation, Medication administration, Evaluate responses to treatment, Monitor vital signs and CBGs as ordered, Perform/monitor CIWA, COWS, AIMS and Fall Risk screenings as ordered,  Perform wound care treatments as ordered.  Evaluation of Outcomes: Progressing   LCSW Treatment Plan for Primary Diagnosis: Substance induced mood disorder (HCC) Long Term Goal(s): Safe transition to appropriate next level of care at discharge, Engage patient in therapeutic group addressing interpersonal concerns.  Short Term Goals: Engage patient in aftercare planning with referrals and resources and Increase emotional regulation  Therapeutic Interventions: Assess for all discharge needs, 1 to 1 time with Social worker, Explore available resources and support systems, Assess for adequacy in community support network, Educate family and significant other(s) on suicide prevention, Complete Psychosocial Assessment, Interpersonal group therapy.  Evaluation of Outcomes: Progressing   Progress in Treatment: Attending groups: Yes. Participating in groups: Yes. Taking medication as prescribed: Yes. Toleration medication: Yes. Family/Significant other contact made: No, will contact:  mother. (pt provided consent) Patient understands diagnosis: Yes, however, pt demonstrates minimal insight at this time. Problems/goals with staff: No. Medical problems stabilized or resolved: Yes. Denies suicidal/homicidal ideation: Yes Issues/concerns per patient self-inventory: No. Other: n/a   New problem(s) identified: No, Describe:  n/a  New Short Term/Long Term Goal(s): detox, medication management for mood stabilization; elimination of SI thoughts; development of comprehensive mental wellness/sobriety plan.   Patient Goals:  Pt declined to provide goal.   Discharge Plan or Barriers: CSW assessing for appropriate referrals. [facility] pamphlet, Mobile Crisis information, and AA/NA information provided to patient for additional community support and resources.   Reason for Continuation of Hospitalization: Anxiety Depression Medication stabilization  SI Withdrawals  Estimated Length of Stay:Wed,  04/06/18  Attendees: Patient: Pedro Owens 04/04/2018 8:44 AM  Physician: Dr. Altamese [place] MD; Dr. Jama Flavors MD 04/04/2018 8:44  AM  Nursing: Polo Riley; [facility] RN 04/04/2018 8:44 AM  RN Care Manager:x 04/04/2018 8:44 AM  Social Worker: Corrie Mckusick LCSW 04/04/2018 8:44 AM  Recreational Therapist: x 04/04/2018 8:44 AM  Other: Armandina Stammer NP 04/04/2018 8:44 AM  Other:  04/04/2018 8:44 AM  Other: 04/04/2018 8:44 AM    Scribe for Treatment Team: Rona Ravens, LCSW 04/04/2018 8:44 AM

## 2018-04-04 NOTE — [facility] Group Notes (Signed)
LCSW Group Therapy Note   04/04/2018 1:15pm   Type of Therapy and Topic:  Group Therapy:  Overcoming Obstacles   Participation Level:  Did Not Attend--pt invited. Chose to remain    Description of Group:    In this group patients will be encouraged to explore what they see as obstacles to their own wellness and recovery. They will be guided to discuss their thoughts, feelings, and behaviors related to these obstacles. The group will process together ways to cope with barriers, with attention given to specific choices patients can make. Each patient will be challenged to identify changes they are motivated to make in order to overcome their obstacles. This group will be process-oriented, with patients participating in exploration of their own experiences as well as giving and receiving support and challenge from other group members.   Therapeutic Goals: 1. Patient will identify personal and current obstacles as they relate to admission. 2. Patient will identify barriers that currently interfere with their wellness or overcoming obstacles.  3. Patient will identify feelings, thought process and behaviors related to these barriers. 4. Patient will identify two changes they are willing to make to overcome these obstacles:      Summary of Patient Progress   x   Therapeutic Modalities:   Cognitive Behavioral Therapy Solution Focused Therapy Motivational Interviewing Relapse Prevention Therapy  Rona Ravens, LCSW 04/04/2018 9:42 AM

## 2018-04-04 NOTE — Progress Notes (Addendum)
Patient stated it did not matter being called he or she.  Patient's L lower arm bandage rewrapped this afternoon.  Cut areas were cleaned with normal saline and wrapped.  No drainage, swelling, redness or odor.    D:  Patient's self inventory sheet, patient sleeps good, sleep medication helpful.  Fair appetite, normal energy level, good concentration.  Rated depression 8, hopeless 6, anxiety 2.  Withdrawals, cravings, irritability.  Denied SI.  Denied physical problems.  Denied physical pain.  No goal today.  No discharge plans. A:  Zoloft 50 mg refused this morning.  MD informed.  Emotional support and encouragement given patient. R:  Patient denied SI and HI, contracts for safety.  Denied A/V hallucinations.  Safety maintained with 15 minute checks.  Patient has stayed in bed most of the day, did attend first SW group this morning.

## 2018-04-04 NOTE — Plan of Care (Signed)
Nurse discussed depression, anxiety, coping skills with patient.  

## 2018-04-04 NOTE — Progress Notes (Signed)
Recreation Therapy Notes  Date: 6.17.19 Time: 0930 Location: [address] Dayroom  Group Topic: Stress Management  Goal Area(s) Addresses:  Patient will verbalize importance of using healthy stress management.  Patient will identify positive emotions associated with healthy stress management.   Intervention: Stress Management  Activity :  Choice Meditation.  LRT played a meditation on choice.  Patients were to follow along as meditation was played to engage in activity.  Education:  Stress Management, Discharge Planning.   Education Outcome: Acknowledges edcuation/In group clarification offered/Needs additional education  Clinical Observations/Feedback: Pt did not attend group.    Caroll Rancher, LRT/CTRS         Caroll Rancher A 04/04/2018 11:57 AM

## 2018-04-04 NOTE — Progress Notes (Signed)
[facility] MD Progress Note  04/04/2018 1:10 PM Pedro Owens  MRN:  [id number] Subjective: Patient reports he feels " the same". Denies medication side effects.  Denies suicidal ideations . Complains of limited/fair sleep Objective : I have  discussed case with treatment team and have met with patient. 27 year old , single, unemployed.  Reports  he recently returned from a rehab setting in [place].  States he relapsed soon after discharge and has been using cocaine daily over recent days.  Was admitted under commitment due to worsening depression, suicidal ideations, self-injurious behaviors (self cutting). Currently remains dysphoric, depressed, but denies any suicidal ideations and is currently future oriented stating that he intends to return home at discharge.  At present does not endorse suicidal or self cutting ideations and contracts for safety on unit. Currently tolerating medications well.  Does not endorse medication side effects. As reviewed with staff has been isolative, spending most time in his room, limited milieu interaction at this time.    Principal Problem: Substance induced mood disorder ([place]) Diagnosis:   Patient Active Problem List   Diagnosis Date Noted  . Substance induced mood disorder ([place]) [F19.94] 04/03/2018  . Pressure injury of skin [L89.90] 11/26/2017  . SOB (shortness of breath) [R06.02]   . Septic embolism ([place]) [I76]   . 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 Pleural effusion, bacterial [J90] 11/23/2017  . Loculated pleural effusion [J90] 11/23/2017  . Hepatitis C antibody test positive [R76.8] 11/19/2017  . Septic pulmonary embolism ([place]) [I26.90] 11/19/2017  . Acute endocarditis [I33.9]   . Pleuritic chest pain [R07.81]   . IVDU (intravenous drug user) [F19.90]   . Endocarditis, suspected [R09.89] 11/18/2017  . SIRS (systemic inflammatory response syndrome) ([place]) [R65.10] 11/15/2017  . Injury, superficial, elbow, forearm, or wrist with infection, right, initial  encounter [S50.901A, S50.911A, S60.911A, L08.9] 11/12/2016  . Abscess of right forearm [L02.413] 11/04/2016  . HIV disease ([place]) [B20] 04/07/2016  . Cigarette smoker [F17.210] 04/07/2016  . Polysubstance abuse ([place]) [F19.10] 04/07/2016  . Depression [F32.9] 08/14/2015  . Conversion disorder [F44.9] 08/14/2015  . Hemorrhoids, internal, with bleeding [K64.8] 08/25/2011  . Anal warts [A63.0] 08/25/2011  . ANXIETY [F41.1] 09/24/2010  . ADHD [F90.9] 09/24/2010   Total Time spent with patient: 15 minutes  Past Psychiatric History:   Past Medical History:  Past Medical History:  Diagnosis Date  . ADHD (attention deficit hyperactivity disorder)   . Anal warts   . Anxiety   . Asthma   . Bipolar disorder ([place])   . Chronic bronchitis ([place])   . Chronic pain syndrome   . Convulsions ([place]) 08/14/2015  . Depression   . Gonorrhea 09/08/11  . Headache    "weekly" (11/06/2016)  . Hemorrhoids, internal, with bleeding 08/25/2011  . Hepatitis C   . Herpes   . HIV infection ([place])   . Hypertension   . ILEITIS 09/24/2010   Qualifier: Diagnosis of  By: Nelson-Smith CMA (AAMA), Dottie    . Lymphoid hyperplasia, reactive   . Marijuana abuse   . Migraine    "monthly" (11/06/2016)  . Pseudoseizures   . Seizures ([place])    "don't know why I have them" (11/06/2016)  . Syphilis     Past Surgical History:  Procedure Laterality Date  . ANKLE SURGERY Bilateral 2005   Screw & Pins  . ANKLE SURGERY Right 2008   "replaced pins & screws"  . FOOT SURGERY Bilateral 2005   "bone spurs"  . I&D EXTREMITY Right 06/26/2016   Procedure: IRRIGATION AND  DEBRIDEMENT RIGHT FOREARM;  Surgeon: Leanora Cover, MD;  Location: [place];  Service: Orthopedics;  Laterality: Right;  . I&D EXTREMITY Right 11/04/2016   Procedure: IRRIGATION AND DEBRIDEMENT EXTREMITY;  Surgeon: Charlotte Crumb, MD;  Location: [place];  Service: Orthopedics;  Laterality: Right;  . I&D EXTREMITY Right 11/06/2016   Procedure: IRRIGATION AND DEBRIDEMENT  right forarm;  Surgeon: Charlotte Crumb, MD;  Location: [place];  Service: Orthopedics;  Laterality: Right;  . TEE WITHOUT CARDIOVERSION N/A 11/19/2017   Procedure: TRANSESOPHAGEAL ECHOCARDIOGRAM (TEE);  Surgeon: Fay Records, MD;  Location: [place];  Service: Cardiovascular;  Laterality: N/A;  . VIDEO ASSISTED THORACOSCOPY (VATS)/EMPYEMA Left 11/26/2017   Procedure: VIDEO ASSISTED THORACOSCOPY (VATS)/DRAIN EMPYEMA;  Surgeon: Ivin Poot, MD;  Location: [facility];  Service: Thoracic;  Laterality: Left;   Family History:  Family History  Problem Relation Age of Onset  . Diabetes Mother   . Irritable bowel syndrome Mother   . Hypertension Mother   . Hyperlipidemia Mother   . Hypothyroidism Mother   . Diabetes Maternal Uncle   . Heart disease Maternal Grandmother   . Leukemia Other        maternal greatgrandmother  . Colon cancer Neg Hx    Family Psychiatric  History:  Social History:  Social History   Substance and Sexual Activity  Alcohol Use Yes   Comment: 11/06/2016 "nothing in a very long time"     Social History   Substance and Sexual Activity  Drug Use Yes  . Frequency: 2.0 times per week  . Types: Cocaine, Benzodiazepines, Oxycodone, Marijuana, Heroin   Comment: yesterday    Social History   Socioeconomic History  . Marital status: Single    Spouse name: Not on file  . Number of children: 0  . Years of education: Not on file  . Highest education level: Not on file  Occupational History  . Occupation: Freight forwarder  Social Needs  . Financial resource strain: Not on file  . Food insecurity:    Worry: Not on file    Inability: Not on file  . Transportation needs:    Medical: Not on file    Non-medical: Not on file  Tobacco Use  . Smoking status: Current Every Day Smoker    Packs/day: 1.50    Years: 11.00    Pack years: 16.50    Types: Cigarettes  . Smokeless tobacco: Never Used  Substance and Sexual Activity  . Alcohol use: Yes    Comment: 11/06/2016  "nothing in a very long time"  . Drug use: Yes    Frequency: 2.0 times per week    Types: Cocaine, Benzodiazepines, Oxycodone, Marijuana, Heroin    Comment: yesterday  . Sexual activity: Not Currently    Partners: Male    Birth control/protection: Condom  Lifestyle  . Physical activity:    Days per week: Not on file    Minutes per session: Not on file  . Stress: Not on file  Relationships  . Social connections:    Talks on phone: Not on file    Gets together: Not on file    Attends religious service: Not on file    Active member of club or organization: Not on file    Attends meetings of clubs or organizations: Not on file    Relationship status: Not on file  Other Topics Concern  . Not on file  Social History Narrative  . Not on file   Additional Social History:    Pain  Medications: See MAR Prescriptions: See MAR Over the Counter: See MAR History of alcohol / drug use?: Yes Longest period of sobriety (when/how long): unknown Negative Consequences of Use: Financial, Personal relationships, Work / School Name of Substance 1: Cocaine (crack) 1 - Age of First Use: unknown 1 - Amount (size/oz): unknown 1 - Frequency: unknown 1 - Duration: unknown 1 - Last Use / Amount: 06/116/19, "A small amount" Name of Substance 2: Heroin 2 - Age of First Use: unknown 2 - Amount (size/oz): unknown 2 - Frequency: unknown 2 - Duration: unknown 2 - Last Use / Amount: 02/27/18  Sleep: Fair  Appetite:  Fair  Current Medications: Current Facility-Administered Medications  Medication Dose Route Frequency Provider Last Rate Last Dose  . abacavir-dolutegravir-lamiVUDine (TRIUMEQ) [id number] MG per tablet 1 tablet  1 tablet Oral Daily Laverle Hobby, PA-C   1 tablet at 04/04/18 0809  . alum & mag hydroxide-simeth (MAALOX/MYLANTA) 200-200-20 MG/5ML suspension 30 mL  30 mL Oral Q4H PRN Patriciaann Clan E, PA-C      . ARIPiprazole (ABILIFY) tablet 2 mg  2 mg Oral Daily Cobos, Myer Peer, MD    2 mg at 04/04/18 0809  . FLUoxetine (PROZAC) capsule 10 mg  10 mg Oral Daily Cobos, Myer Peer, MD   10 mg at 04/04/18 0809  . hydrOXYzine (ATARAX/VISTARIL) tablet 25 mg  25 mg Oral Q6H PRN Patriciaann Clan E, PA-C      . ibuprofen (ADVIL,MOTRIN) tablet 600 mg  600 mg Oral Q6H PRN Patriciaann Clan E, PA-C      . magnesium hydroxide (MILK OF MAGNESIA) suspension 30 mL  30 mL Oral Daily PRN Laverle Hobby, PA-C      . traZODone (DESYREL) tablet 100 mg  100 mg Oral QHS,MR X 1 Laverle Hobby, PA-C   100 mg at 04/03/18 2131    Lab Results:  Results for orders placed or performed during the hospital encounter of 04/02/18 (from the past 48 hour(s))  Comprehensive metabolic panel     Status: Abnormal   Collection Time: 04/02/18 10:14 PM  Result Value Ref Range   Sodium 142 135 - 145 mmol/L   Potassium 3.5 3.5 - 5.1 mmol/L   Chloride 106 101 - 111 mmol/L   CO2 25 22 - 32 mmol/L   Glucose, Bld 128 (H) 65 - 99 mg/dL   BUN 18 6 - 20 mg/dL   Creatinine, Ser 1.09 0.61 - 1.24 mg/dL   Calcium 9.4 8.9 - 10.3 mg/dL   Total Protein 7.9 6.5 - 8.1 g/dL   Albumin 4.1 3.5 - 5.0 g/dL   AST 49 (H) 15 - 41 U/L   ALT 56 17 - 63 U/L   Alkaline Phosphatase 71 38 - 126 U/L   Total Bilirubin 0.5 0.3 - 1.2 mg/dL   GFR calc non Af Amer >60 >60 mL/min   GFR calc Af Amer >60 >60 mL/min    Comment: (NOTE) The eGFR has been calculated using the CKD EPI equation. This calculation has not been validated in all clinical situations. eGFR's persistently <60 mL/min signify possible Chronic Kidney Disease.    Anion gap 11 5 - 15    Comment: Performed at [facility] [address], [place], [place] [address]  Ethanol     Status: None   Collection Time: 04/02/18 10:14 PM  Result Value Ref Range   Alcohol, Ethyl (B) <10 <10 mg/dL    Comment: (NOTE) Lowest detectable limit for serum alcohol is 10  mg/dL. For medical purposes only. Performed at [facility], [place] [address] [place], [place] [address]    Salicylate level     Status: None   Collection Time: 04/02/18 10:14 PM  Result Value Ref Range   Salicylate Lvl <7.9 2.8 - 30.0 mg/dL    Comment: Performed at [facility] [address], [place], [place] [address]  Acetaminophen level     Status: Abnormal   Collection Time: 04/02/18 10:14 PM  Result Value Ref Range   Acetaminophen (Tylenol), Serum <10 (L) 10 - 30 ug/mL    Comment: (NOTE) Therapeutic concentrations vary significantly. A range of 10-30 ug/mL  may be an effective concentration for many patients. However, some  are best treated at concentrations outside of this range. Acetaminophen concentrations >150 ug/mL at 4 hours after ingestion  and >50 ug/mL at 12 hours after ingestion are often associated with  toxic reactions. Performed at [facility], [place] [address], [place], [place] [address]   cbc     Status: None   Collection Time: 04/02/18 10:14 PM  Result Value Ref Range   WBC 8.8 4.0 - 10.5 K/uL   RBC 5.65 4.22 - 5.81 MIL/uL   Hemoglobin 15.9 13.0 - 17.0 g/dL   HCT 47.6 39.0 - 52.0 %   MCV 84.2 78.0 - 100.0 fL   MCH 28.1 26.0 - 34.0 pg   MCHC 33.4 30.0 - 36.0 g/dL   RDW 14.7 11.5 - 15.5 %   Platelets 376 150 - 400 K/uL    Comment: Performed at [facility], [place] [address] [place], [place] [address]  Rapid urine drug screen (hospital performed)     Status: Abnormal   Collection Time: 04/02/18 10:18 PM  Result Value Ref Range   Opiates NONE DETECTED NONE DETECTED   Cocaine POSITIVE (A) NONE DETECTED   Benzodiazepines NONE DETECTED NONE DETECTED   Amphetamines NONE DETECTED NONE DETECTED   Tetrahydrocannabinol POSITIVE (A) NONE DETECTED   Barbiturates (A) NONE DETECTED    Result not available. Reagent lot number recalled by manufacturer.    Comment: Performed at [facility], [place] [address], [place],  [address]    Blood Alcohol level:  Lab Results  Component Value Date   ETH <10 04/02/2018   ETH  03/06/2008    <5         LOWEST DETECTABLE LIMIT FOR SERUM ALCOHOL IS 11 mg/dL FOR MEDICAL PURPOSES ONLY    Metabolic Disorder Labs: Lab Results  Component Value Date   HGBA1C 5.8 (H) 11/25/2017   MPG 119.76 11/25/2017   No results found for: PROLACTIN Lab Results  Component Value Date   CHOL 97 01/06/2017   TRIG 114 01/06/2017   HDL 39 (L) 01/06/2017   CHOLHDL 2.5 01/06/2017   VLDL 23 01/06/2017   LDLCALC 35 01/06/2017   LDLCALC 43 03/26/2016    Physical Findings: AIMS: Facial and Oral Movements Muscles of Facial Expression: None, normal Lips and Perioral Area: None, normal Jaw: None, normal Tongue: None, normal,Extremity Movements Upper (arms, wrists, hands, fingers): None, normal Lower (legs, knees, ankles, toes): None, normal, Trunk Movements Neck, shoulders, hips: None, normal, Overall Severity Severity of abnormal movements (highest score from questions above): None, normal Incapacitation due to abnormal movements: None, normal Patient's awareness of abnormal movements (rate only patient's report): No Awareness, Dental Status Current problems with teeth and/or dentures?: No Does patient usually wear dentures?: No  CIWA:    COWS:  Musculoskeletal: Strength & Muscle Tone: within normal limits Gait & Station: normal Patient leans: N/A  Psychiatric Specialty Exam: Physical Exam  ROS no chest pain, no shortness of breath, no vomiting, no fever  Blood pressure 109/86, pulse (!) 110, temperature 97.8 F (36.6 C), temperature source Oral, resp. rate 16, height _0  (1.803 m), weight 69.9 kg (154 lb), SpO2 100 %.Body mass index is 21.48 kg/m.  General Appearance: Fairly Groomed  Eye Contact:  Fair  Speech:  Slow  Volume:  Decreased  Mood:  Depressed  Affect:  Dysphoric, vaguely irritable  Thought Process:  Linear and Descriptions of Associations: Intact  Orientation:  Other:  He is fully alert and  presents oriented x3  Thought Content:  Does not endorse hallucinations and does  not appear internally preoccupied, no delusions are expressed at this time  Suicidal Thoughts:  No currently denies suicidal plan or intention and contracts for safety on unit, denies homicidal ideations  Homicidal Thoughts:  No  Memory:  recent and remote grossly intact   Judgement:  Fair  Insight:  Fair  Psychomotor Activity:  Decreased  Concentration:  Concentration: Fair and Attention Span: Fair  Recall:  [employer] of Knowledge:  Fair  Language:  Fair  Akathisia:  Negative  Handed:  Right  AIMS (if indicated):     Assets:  Desire for Improvement Resilience  ADL's:  fair  Cognition:  WNL  Sleep:  Number of Hours: 4.75   Assessment -27 year old, presented under commitment for worsening depression, self cutting.  Had relapsed on cocaine recently following discharge from a rehab in [place].  Today presents dysphoric, irritable, isolative.  Denies suicidal ideations . He is currently on Prozac, Abilify for antidepressant augmentation, and on anti-retroviral for history of HIV. Denies medication side effects thus far.  Treatment Plan Summary: Daily contact with patient to assess and evaluate symptoms and progress in treatment, Medication management, Plan Inpatient treatment and Medications as below Encourage group and milieu participation to work on coping skills and symptom reduction Encourage efforts to work on sobriety and relapse prevention Continue Prozac for depression-titrate to 20 mg daily. Continue Abilify 2 mg daily for antidepressant augmentation Continue trazodone 100 mg nightly PRN for insomnia as needed Continue Vistaril 25 mg every 6 hours PRN for anxiety as needed Continue anti-retroviral medication ( Triumeq) for HIV-patient follows at local [facility]  ID outpatient clinic for outpatient management Treatment team working on disposition planning. Jenne Campus, MD 04/04/2018, 1:10 PM

## 2018-04-05 NOTE — Progress Notes (Signed)
Patient has been in bed most of the day.  Patient denied SI and HI, contracts for safety.  Denied A/V hallucinations.  Denied pain.  Medications administered per MD orders.  Emotional support and encouragement given patient. Safety maintained with 15 minute checks.

## 2018-04-05 NOTE — [facility] Group Notes (Signed)
[facility] Group Therapy 04/05/2018 1:15pm  Type of Therapy: [facility] Presentation  Participation Level: Pt invited. Chose to remain in bed.   Rona Ravens, LCSW 04/05/2018 2:49 PM

## 2018-04-05 NOTE — Plan of Care (Signed)
After 1:1 interaction with RN, he attended evening AA group and was noted sitting in the dayroom watching a comedian on TV.  We will continue to monitor the progress towards his goals.

## 2018-04-05 NOTE — Plan of Care (Signed)
Nurse discussed depression, anxiety, coping skills with patient.  

## 2018-04-05 NOTE — Progress Notes (Signed)
[facility] MD Progress Note  04/05/2018 11:40 AM Pedro Owens  MRN:  [id number] Subjective: Patient is seen and examined.  Patient is a 27 year old male with a past psychiatric history significant for substance-induced mood disorder, hepatitis C, HIV positive, history of intravenous drug use, polysubstance use disorder and depression who was admitted on 04/03/2018 after the patient's family called law enforcement because the patient was threatening suicide and to cut his left arm.  The patient's mother or sister went to the magistrate's office to petition for the involuntary commitment.  Patient admitted that he had attempted to harm himself in the past.  He had a history of cutting his wrist.  He has a history of substance use and depression is currently prescribed Prozac and trazodone.  Admission he admitted loss of interest in usual pleasures, irritability, decreased concentration, decreased appetite, crying spells and social withdrawal.  Patient stated today is feeling better.  He stated he was not suicidal.  He stated he was not homicidal.  There is no evidence of psychosis.  He asked when he would be discharged home.  We discussed that he would get follow-up for his HIV at a clinic in the area. Principal Problem: Substance induced mood disorder (HCC) Diagnosis:   Patient Active Problem List   Diagnosis Date Noted  . Substance induced mood disorder (HCC) [F19.94] 04/03/2018  . Pressure injury of skin [L89.90] 11/26/2017  . SOB (shortness of breath) [R06.02]   . Septic embolism (HCC) [I76]   . 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 Pleural effusion, bacterial [J90] 11/23/2017  . Loculated pleural effusion [J90] 11/23/2017  . Hepatitis C antibody test positive [R76.8] 11/19/2017  . Septic pulmonary embolism (HCC) [I26.90] 11/19/2017  . Acute endocarditis [I33.9]   . Pleuritic chest pain [R07.81]   . IVDU (intravenous drug user) [F19.90]   . Endocarditis, suspected [R09.89] 11/18/2017  . SIRS (systemic  inflammatory response syndrome) (HCC) [R65.10] 11/15/2017  . Injury, superficial, elbow, forearm, or wrist with infection, right, initial encounter [S50.901A, S50.911A, S60.911A, L08.9] 11/12/2016  . Abscess of right forearm [L02.413] 11/04/2016  . HIV disease (HCC) [B20] 04/07/2016  . Cigarette smoker [F17.210] 04/07/2016  . Polysubstance abuse (HCC) [F19.10] 04/07/2016  . Depression [F32.9] 08/14/2015  . Conversion disorder [F44.9] 08/14/2015  . Hemorrhoids, internal, with bleeding [K64.8] 08/25/2011  . Anal warts [A63.0] 08/25/2011  . ANXIETY [F41.1] 09/24/2010  . ADHD [F90.9] 09/24/2010   Total Time spent with patient: 20 minutes  Past Psychiatric History: See admission H&P  Past Medical History:  Past Medical History:  Diagnosis Date  . ADHD (attention deficit hyperactivity disorder)   . Anal warts   . Anxiety   . Asthma   . Bipolar disorder (HCC)   . Chronic bronchitis (HCC)   . Chronic pain syndrome   . Convulsions (HCC) 08/14/2015  . Depression   . Gonorrhea 09/08/11  . Headache    "weekly" (11/06/2016)  . Hemorrhoids, internal, with bleeding 08/25/2011  . Hepatitis C   . Herpes   . HIV infection (HCC)   . Hypertension   . ILEITIS 09/24/2010   Qualifier: Diagnosis of  By: Nelson-Smith CMA (AAMA), Dottie    . Lymphoid hyperplasia, reactive   . Marijuana abuse   . Migraine    "monthly" (11/06/2016)  . Pseudoseizures   . Seizures (HCC)    "don't know why I have them" (11/06/2016)  . Syphilis     Past Surgical History:  Procedure Laterality Date  . ANKLE SURGERY Bilateral 2005   Screw &  Pins  . ANKLE SURGERY Right 2008   "replaced pins & screws"  . FOOT SURGERY Bilateral 2005   "bone spurs"  . I&D EXTREMITY Right 06/26/2016   Procedure: IRRIGATION AND DEBRIDEMENT RIGHT FOREARM;  Surgeon: Betha Loa, MD;  Location: [facility] OR;  Service: Orthopedics;  Laterality: Right;  . I&D EXTREMITY Right 11/04/2016   Procedure: IRRIGATION AND DEBRIDEMENT EXTREMITY;  Surgeon:  Dairl Ponder, MD;  Location: [facility] OR;  Service: Orthopedics;  Laterality: Right;  . I&D EXTREMITY Right 11/06/2016   Procedure: IRRIGATION AND DEBRIDEMENT right forarm;  Surgeon: Dairl Ponder, MD;  Location: [facility] OR;  Service: Orthopedics;  Laterality: Right;  . TEE WITHOUT CARDIOVERSION N/A 11/19/2017   Procedure: TRANSESOPHAGEAL ECHOCARDIOGRAM (TEE);  Surgeon: Pricilla Riffle, MD;  Location[facility] ENDOSCOPY;  Service: Cardiovascular;  Laterality: N/A;  . VIDEO ASSISTED THORACOSCOPY (VATS)/EMPYEMA Left 11/26/2017   Procedure: VIDEO ASSISTED THORACOSCOPY (VATS)/DRAIN EMPYEMA;  Surgeon: Kerin Perna, MD;  Location[facility] OR;  Service: Thoracic;  Laterality: Left;   Family History:  Family History  Problem Relation Age of Onset  . Diabetes Mother   . Irritable bowel syndrome Mother   . Hypertension Mother   . Hyperlipidemia Mother   . Hypothyroidism Mother   . Diabetes Maternal Uncle   . Heart disease Maternal Grandmother   . Leukemia Other        maternal greatgrandmother  . Colon cancer Neg Hx    Family Psychiatric  History: See admission H&P Social History:  Social History   Substance and Sexual Activity  Alcohol Use Yes   Comment: 11/06/2016 "nothing in a very long time"     Social History   Substance and Sexual Activity  Drug Use Yes  . Frequency: 2.0 times per week  . Types: Cocaine, Benzodiazepines, Oxycodone, Marijuana, Heroin   Comment: yesterday    Social History   Socioeconomic History  . Marital status: Single    Spouse name: Not on file  . Number of children: 0  . Years of education: Not on file  . Highest education level: Not on file  Occupational History  . Occupation: Production designer, theatre/television/film  Social Needs  . Financial resource strain: Not on file  . Food insecurity:    Worry: Not on file    Inability: Not on file  . Transportation needs:    Medical: Not on file    Non-medical: Not on file  Tobacco Use  . Smoking status: Current Every Day Smoker    Packs/day: 1.50     Years: 11.00    Pack years: 16.50    Types: Cigarettes  . Smokeless tobacco: Never Used  Substance and Sexual Activity  . Alcohol use: Yes    Comment: 11/06/2016 "nothing in a very long time"  . Drug use: Yes    Frequency: 2.0 times per week    Types: Cocaine, Benzodiazepines, Oxycodone, Marijuana, Heroin    Comment: yesterday  . Sexual activity: Not Currently    Partners: Male    Birth control/protection: Condom  Lifestyle  . Physical activity:    Days per week: Not on file    Minutes per session: Not on file  . Stress: Not on file  Relationships  . Social connections:    Talks on phone: Not on file    Gets together: Not on file    Attends religious service: Not on file    Active member of club or organization: Not on file    Attends meetings of clubs or organizations: Not on  file    Relationship status: Not on file  Other Topics Concern  . Not on file  Social History Narrative  . Not on file   Additional Social History:    Pain Medications: See MAR Prescriptions: See MAR Over the Counter: See MAR History of alcohol / drug use?: Yes Longest period of sobriety (when/how long): unknown Negative Consequences of Use: Financial, Personal relationships, Work / School Name of Substance 1: Cocaine (crack) 1 - Age of First Use: unknown 1 - Amount (size/oz): unknown 1 - Frequency: unknown 1 - Duration: unknown 1 - Last Use / Amount: 06/116/19, "A small amount" Name of Substance 2: Heroin 2 - Age of First Use: unknown 2 - Amount (size/oz): unknown 2 - Frequency: unknown 2 - Duration: unknown 2 - Last Use / Amount: 02/27/18                Sleep: Good  Appetite:  Fair  Current Medications: Current Facility-Administered Medications  Medication Dose Route Frequency Provider Last Rate Last Dose  . abacavir-dolutegravir-lamiVUDine (TRIUMEQ) 600-50-300 MG per tablet 1 tablet  1 tablet Oral Daily Kerry Hough, PA-C   1 tablet at 04/05/18 0981  . alum & mag  hydroxide-simeth (MAALOX/MYLANTA) 200-200-20 MG/5ML suspension 30 mL  30 mL Oral Q4H PRN Donell Sievert E, PA-C      . ARIPiprazole (ABILIFY) tablet 2 mg  2 mg Oral Daily Cobos, Rockey Situ, MD   2 mg at 04/05/18 1914  . FLUoxetine (PROZAC) capsule 20 mg  20 mg Oral Daily Cobos, Rockey Situ, MD   20 mg at 04/05/18 7829  . hydrOXYzine (ATARAX/VISTARIL) tablet 25 mg  25 mg Oral Q6H PRN Kerry Hough, PA-C      . ibuprofen (ADVIL,MOTRIN) tablet 600 mg  600 mg Oral Q6H PRN Donell Sievert E, PA-C      . magnesium hydroxide (MILK OF MAGNESIA) suspension 30 mL  30 mL Oral Daily PRN Kerry Hough, PA-C      . traZODone (DESYREL) tablet 100 mg  100 mg Oral QHS PRN Cobos, Rockey Situ, MD   100 mg at 04/04/18 2151    Lab Results: No results found for this or any previous visit (from the past 48 hour(s)).  Blood Alcohol level:  Lab Results  Component Value Date   ETH <10 04/02/2018   ETH  03/06/2008    <5        LOWEST DETECTABLE LIMIT FOR SERUM ALCOHOL IS 11 mg/dL FOR MEDICAL PURPOSES ONLY    Metabolic Disorder Labs: Lab Results  Component Value Date   HGBA1C 5.8 (H) 11/25/2017   MPG 119.76 11/25/2017   No results found for: PROLACTIN Lab Results  Component Value Date   CHOL 97 01/06/2017   TRIG 114 01/06/2017   HDL 39 (L) 01/06/2017   CHOLHDL 2.5 01/06/2017   VLDL 23 01/06/2017   LDLCALC 35 01/06/2017   LDLCALC 43 03/26/2016    Physical Findings: AIMS: Facial and Oral Movements Muscles of Facial Expression: None, normal Lips and Perioral Area: None, normal Jaw: None, normal Tongue: None, normal,Extremity Movements Upper (arms, wrists, hands, fingers): None, normal Lower (legs, knees, ankles, toes): None, normal, Trunk Movements Neck, shoulders, hips: None, normal, Overall Severity Severity of abnormal movements (highest score from questions above): None, normal Incapacitation due to abnormal movements: None, normal Patient's awareness of abnormal movements (rate only  patient's report): No Awareness, Dental Status Current problems with teeth and/or dentures?: No Does patient usually wear dentures?: No  CIWA:  CIWA-Ar Total: 1 COWS:  COWS Total Score: 1  Musculoskeletal: Strength & Muscle Tone: within normal limits Gait & Station: normal Patient leans: N/A  Psychiatric Specialty Exam: Physical Exam  Nursing note and vitals reviewed. Constitutional: He is oriented to person, place, and time. He appears well-developed and well-nourished.  HENT:  Head: Normocephalic and atraumatic.  Respiratory: Effort normal.  Neurological: He is alert and oriented to person, place, and time.    ROS  Blood pressure (!) 124/95, pulse 97, temperature 97.7 F (36.5 C), temperature source Oral, resp. rate 16, height 5\' 11"  (1.803 m), weight 69.9 kg (154 lb), SpO2 100 %.Body mass index is 21.48 kg/m.  General Appearance: Disheveled  Eye Contact:  Fair  Speech:  Normal Rate  Volume:  Normal  Mood:  Euthymic  Affect:  Congruent  Thought Process:  Coherent  Orientation:  Full (Time, Place, and Person)  Thought Content:  Logical  Suicidal Thoughts:  No  Homicidal Thoughts:  No  Memory:  Immediate;   Fair Recent;   Fair Remote;   Fair  Judgement:  Intact  Insight:  Lacking  Psychomotor Activity:  Normal  Concentration:  Concentration: Fair and Attention Span: Fair  Recall:  [employer] of Knowledge:  Fair  Language:  Fair  Akathisia:  Negative  Handed:  Right  AIMS (if indicated):     Assets:  Communication Skills Desire for Improvement Leisure Time  ADL's:  Intact  Cognition:  WNL  Sleep:  Number of Hours: 6.25     Treatment Plan Summary: Daily contact with patient to assess and evaluate symptoms and progress in treatment, Medication management and Plan Patient is seen and examined.  Patient is a 27 year old male with the above-stated past medical and psychiatric history seen in follow-up.  He continues on Abilify, fluoxetine and trazodone.  No change  in his psychiatric medications.  He also needs to get a follow-up appointment at a local HIV-positive clinic.  He continues on HIV medications.  We will plan discharge tomorrow.  Antonieta Pert, MD 04/05/2018, 11:40 AM

## 2018-04-05 NOTE — [facility] Suicide Risk Assessment (Signed)
[facility] INPATIENT:  Family/Significant Other Suicide Prevention Education  Suicide Prevention Education:  Education Completed; [name], mother [phone]) has been identified by the patient as the family member/significant other with whom the patient will be residing, and identified as the person(s) who will aid the patient in the event of a mental health crisis (suicidal ideations/suicide attempt).  With written consent from the patient, the family member/significant other has been provided the following suicide prevention education, prior to the and/or following the discharge of the patient.  The suicide prevention education provided includes the following:  Suicide risk factors  Suicide prevention and interventions  National Suicide Hotline telephone number  [facility] assessment telephone number  [facility] Emergency Assistance 911  [facility] and/or Residential Mobile Crisis Unit telephone number  Request made of family/significant other to:  Remove weapons (e.g., guns, rifles, knives), all items previously/currently identified as safety concern.    Remove drugs/medications (over-the-counter, prescriptions, illicit drugs), all items previously/currently identified as a safety concern.  The family member/significant other verbalizes understanding of the suicide prevention education information provided.  The family member/significant other agrees to remove the items of safety concern listed above.  Pedro Owens 04/05/2018, 11:26 AM

## 2018-04-06 ENCOUNTER — Encounter (HOSPITAL_COMMUNITY): Payer: Self-pay | Admitting: Behavioral Health

## 2018-04-06 MED ORDER — TRAZODONE HCL 100 MG PO TABS: 100.0000 mg | ORAL_TABLET | Freq: Every evening | ORAL | 0 refills | Status: DC | PRN

## 2018-04-06 MED ORDER — ARIPIPRAZOLE 2 MG PO TABS: 2.0000 mg | ORAL_TABLET | Freq: Every day | ORAL | 0 refills | Status: DC

## 2018-04-06 MED ORDER — HYDROXYZINE HCL 25 MG PO TABS: 25.0000 mg | ORAL_TABLET | Freq: Four times a day (QID) | ORAL | 0 refills | Status: DC | PRN

## 2018-04-06 MED ORDER — FLUOXETINE HCL 20 MG PO CAPS: 20.0000 mg | ORAL_CAPSULE | Freq: Every day | ORAL | 0 refills | Status: DC

## 2018-04-06 NOTE — Progress Notes (Signed)
Discharge note:  Patient discharged home per MD order.  Reviewed AVS/transition record with patient and she indicated understanding. Patient will follow up with [facility] for medication management.  She received all personal belongings from her room and locker. Patient denies any thoughts of self harm.  She left ambulatory with her sister.

## 2018-04-06 NOTE — Therapy (Signed)
Occupational Therapy Group Treatment Note  Date:  04/06/2018 Time:  11:45 AM  Group Topic/Focus:  Stress Management  Participation Level:  Minimal  Participation Quality:  Drowsy  Affect:  Depressed and Flat  Cognitive:  Appropriate  Insight: Lacking and Limited  Engagement in Group:  Lacking and Limited  Modes of Intervention:  Activity, Discussion and Education  Additional Comments:     S: "I know I need to work on acceptance"  O: Stress management group completed to use as productive coping strategy, to help mitigate maladaptive coping to integrate in functional BADL/IADL when reintegrating into community. Education given on the definition of stress and its cognitive, behavioral, emotional, and physical effects on the body. Stress management tools worksheet completed to identify negative coping mechanisms and their short and long term effects vs positive coping mechanisms with demonstration. Coping strategies taught include: relaxation based- deep breathing, counting to 10, taking a 1 minute vacation, acceptance, stress balls, relaxation audio/video, visual/mental imagery. Positive mental attitude- gratitude, acceptance, cognitive reframing, positive self talk, anger management. Progressive muscle relaxation script administered to facilitate relaxation response for more productive engagement in BADL/IADL. Adult coloring and relaxation tips worksheet given at end of session.   A: Pt presents to OT group with very flat and depressed affect and withdrawn. Pt not offering information or sharing examples with other group members even with prompts from therapist. Pt shared x1 at end of group with VC's from therapist. Pt with visible self harm scars and bandages from recent lacerations, implying stress management has been maladaptive this date. Pt completed stress management tools worksheet, stating he ultimately needs to work on acceptance (not stressing over things that cannot be  changed/are out of control). Pt engaged in PMR, stating increased feelings of relaxation. Pt not willing to take extra handouts for continued practice this date.  P: Pt provided with education on stress management activities to implement into daily routine. Handouts given to facilitate carryover when reintegrating into community.  ?    Dalphine Handing, MSOT, OTR/L  [place]licia 04/06/2018, 11:45 AM

## 2018-04-06 NOTE — Progress Notes (Signed)
Pt discharged by writer to the lobby. Pt received d/c packet and belongings from locker at the time of d/c.

## 2018-04-06 NOTE — Progress Notes (Signed)
Patient ID: Pedro Owens, male   DOB: 02-21-1991, 27 y.o.   MRN: [id number] D: Patient has been in her room all evening. Pt has been lying awake on her bed. Pt answered all of writer's questions. Pt reported she was able to go to groups and dinning room during the day. Pt mood and affect appeared depressed and flat. Denies  SI/HI/AVH and pain.No behavioral issues noted.  A: Support and encouragement offered as needed to express needs. Medications administered as prescribed.  R: Patient is safe on the unit. Will continue to monitor  for safety and stability.

## 2018-04-06 NOTE — Plan of Care (Signed)
Problem: Education: Goal: Utilization of techniques to improve thought processes will improve Outcome: Completed/Met Goal: Knowledge of the prescribed therapeutic regimen will improve Outcome: Completed/Met   Problem: Activity: Goal: Interest or engagement in leisure activities will improve Outcome: Completed/Met Goal: Imbalance in normal sleep/wake cycle will improve Outcome: Completed/Met   Problem: Coping: Goal: Coping ability will improve Outcome: Completed/Met Goal: Will verbalize feelings Outcome: Completed/Met   Problem: Health Behavior/Discharge Planning: Goal: Ability to make decisions will improve Outcome: Completed/Met Goal: Compliance with therapeutic regimen will improve Outcome: Completed/Met   Problem: Role Relationship: Goal: Will demonstrate positive changes in social behaviors and relationships Outcome: Completed/Met   Problem: Safety: Goal: Ability to disclose and discuss suicidal ideas will improve Outcome: Completed/Met Goal: Ability to identify and utilize support systems that promote safety will improve Outcome: Completed/Met   Problem: Self-Concept: Goal: Will verbalize positive feelings about self Outcome: Completed/Met Goal: Level of anxiety will decrease Outcome: Completed/Met   Problem: Education: Goal: Knowledge of [place] [facility] Education information/materials will improve Outcome: Completed/Met Goal: Emotional status will improve Outcome: Completed/Met Goal: Mental status will improve Outcome: Completed/Met Goal: Verbalization of understanding the information provided will improve Outcome: Completed/Met   Problem: Activity: Goal: Interest or engagement in activities will improve Outcome: Completed/Met Goal: Sleeping patterns will improve Outcome: Completed/Met   Problem: Coping: Goal: Ability to verbalize frustrations and anger appropriately will improve Outcome: Completed/Met Goal: Ability to demonstrate self-control  will improve Outcome: Completed/Met   Problem: Health Behavior/Discharge Planning: Goal: Identification of resources available to assist in meeting health care needs will improve Outcome: Completed/Met Goal: Compliance with treatment plan for underlying cause of condition will improve Outcome: Completed/Met   Problem: Physical Regulation: Goal: Ability to maintain clinical measurements within normal limits will improve Outcome: Completed/Met   Problem: Safety: Goal: Periods of time without injury will increase Outcome: Completed/Met   Problem: Education: Goal: Knowledge of [place] [facility] Education information/materials will improve Outcome: Completed/Met Goal: Emotional status will improve Outcome: Completed/Met Goal: Mental status will improve Outcome: Completed/Met Goal: Verbalization of understanding the information provided will improve Outcome: Completed/Met   Problem: Activity: Goal: Interest or engagement in activities will improve Outcome: Completed/Met Goal: Sleeping patterns will improve Outcome: Completed/Met   Problem: Coping: Goal: Ability to verbalize frustrations and anger appropriately will improve Outcome: Completed/Met Goal: Ability to demonstrate self-control will improve Outcome: Completed/Met   Problem: Health Behavior/Discharge Planning: Goal: Identification of resources available to assist in meeting health care needs will improve Outcome: Completed/Met Goal: Compliance with treatment plan for underlying cause of condition will improve Outcome: Completed/Met   Problem: Physical Regulation: Goal: Ability to maintain clinical measurements within normal limits will improve Outcome: Completed/Met   Problem: Safety: Goal: Periods of time without injury will increase Outcome: Completed/Met   Problem: Education: Goal: Knowledge of disease or condition will improve Outcome: Completed/Met Goal: Understanding of discharge needs will  improve Outcome: Completed/Met   Problem: Health Behavior/Discharge Planning: Goal: Ability to identify changes in lifestyle to reduce recurrence of condition will improve Outcome: Completed/Met Goal: Identification of resources available to assist in meeting health care needs will improve Outcome: Completed/Met   Problem: Physical Regulation: Goal: Complications related to the disease process, condition or treatment will be avoided or minimized Outcome: Completed/Met   Problem: Safety: Goal: Ability to remain free from injury will improve Outcome: Completed/Met   Problem: Education: Goal: Ability to make informed decisions regarding treatment will improve Outcome: Completed/Met   Problem: Coping: Goal: Coping ability will improve Outcome: Completed/Met   Problem:  Health Behavior/Discharge Planning: Goal: Identification of resources available to assist in meeting health care needs will improve Outcome: Completed/Met   Problem: Medication: Goal: Compliance with prescribed medication regimen will improve Outcome: Completed/Met   Problem: Self-Concept: Goal: Ability to disclose and discuss suicidal ideas will improve Outcome: Completed/Met Goal: Will verbalize positive feelings about self Outcome: Completed/Met

## 2018-04-06 NOTE — Progress Notes (Signed)
  [facility] Adult Case Management Discharge Plan :  Will you be returning to the same living situation after discharge:  Yes,  patient reports that he plans to return to his home, alone.  At discharge, do you have transportation home?: Yes,  bus pass Do you have the ability to pay for your medications: No.  Release of information consent forms completed and in the chart;  Patient's signature needed at discharge.  Patient to Follow up at: Follow-up Information    [facility] Go on 04/13/2018.   Specialty:  Behavioral Health Why:  Apppointment is Wednesday, 04/13/18 at 8:00am. Please be sure to bring your Photo ID, SSN, any insurance information and you discharge paperwork from the hospital.  Contact information: [address] [place] [address] [phone]           Next level of care provider has access to [facility] Link:yes  Safety Planning and Suicide Prevention discussed: Yes,  with the patient's mother  Have you used any form of tobacco in the last 30 days? (Cigarettes, Smokeless Tobacco, Cigars, and/or Pipes): Yes  Has patient been referred to the Quitline?: Patient refused referral  Patient has been referred for addiction treatment: Pt. refused referral  [name], LCSWA 04/06/2018, 10:13 AM

## 2018-04-06 NOTE — [facility] Suicide Risk Assessment (Signed)
[facility] Discharge Suicide Risk Assessment   Principal Problem: Substance induced mood disorder [facility]) Discharge Diagnoses:  Patient Active Problem List   Diagnosis Date Noted  . Substance induced mood disorder (HCC) [F19.94] 04/03/2018  . Pressure injury of skin [L89.90] 11/26/2017  . SOB (shortness of breath) [R06.02]   . Septic embolism (HCC) [I76]   . 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 Pleural effusion, bacterial [J90] 11/23/2017  . Loculated pleural effusion [J90] 11/23/2017  . Hepatitis C antibody test positive [R76.8] 11/19/2017  . Septic pulmonary embolism (HCC) [I26.90] 11/19/2017  . Acute endocarditis [I33.9]   . Pleuritic chest pain [R07.81]   . IVDU (intravenous drug user) [F19.90]   . Endocarditis, suspected [R09.89] 11/18/2017  . SIRS (systemic inflammatory response syndrome) (HCC) [R65.10] 11/15/2017  . Injury, superficial, elbow, forearm, or wrist with infection, right, initial encounter [S50.901A, S50.911A, S60.911A, L08.9] 11/12/2016  . Abscess of right forearm [L02.413] 11/04/2016  . HIV disease (HCC) [B20] 04/07/2016  . Cigarette smoker [F17.210] 04/07/2016  . Polysubstance abuse (HCC) [F19.10] 04/07/2016  . Depression [F32.9] 08/14/2015  . Conversion disorder [F44.9] 08/14/2015  . Hemorrhoids, internal, with bleeding [K64.8] 08/25/2011  . Anal warts [A63.0] 08/25/2011  . ANXIETY [F41.1] 09/24/2010  . ADHD [F90.9] 09/24/2010    Total Time spent with patient: 30 minutes  Musculoskeletal: Strength & Muscle Tone: within normal limits Gait & Station: normal Patient leans: N/A  Psychiatric Specialty Exam: Review of Systems  All other systems reviewed and are negative.   Blood pressure 116/83, pulse 95, temperature 97.7 F (36.5 C), temperature source Oral, resp. rate 18, height 5\' 11"  (1.803 m), weight 69.9 kg (154 lb), SpO2 100 %.Body mass index is 21.48 kg/m.  General Appearance: Casual  Eye Contact::  Good  Speech:  Normal Rate409  Volume:  Normal  Mood:  Euthymic   Affect:  Congruent  Thought Process:  Coherent  Orientation:  Full (Time, Place, and Person)  Thought Content:  Logical  Suicidal Thoughts:  No  Homicidal Thoughts:  No  Memory:  Immediate;   Fair Recent;   Fair Remote;   Fair  Judgement:  Intact  Insight:  Fair  Psychomotor Activity:  Normal  Concentration:  Fair  Recall:  Good  Fund of Knowledge:Good  Language: Fair  Akathisia:  Negative  Handed:  Right  AIMS (if indicated):     Assets:  Communication Skills Desire for Improvement Housing Resilience Social Support  Sleep:  Number of Hours: 6.75  Cognition: WNL  ADL's:  Intact   Mental Status Per Nursing Assessment::   On Admission:  Suicidal ideation indicated by others, Self-harm behaviors  Demographic Factors:  Adolescent or young adult, Gay, lesbian, or bisexual orientation, Low socioeconomic status and Unemployed  Loss Factors: NA  Historical Factors: Impulsivity  Risk Reduction Factors:   Positive social support  Continued Clinical Symptoms:  Alcohol/Substance Abuse/Dependencies  Cognitive Features That Contribute To Risk:  None    Suicide Risk:  Minimal: No identifiable suicidal ideation.  Patients presenting with no risk factors but with morbid ruminations; may be classified as minimal risk based on the severity of the depressive symptoms  Follow-up Information    [facility]. Go on 04/13/2018.   Specialty:  Behavioral Health Why:  Apppointment is Wednesday, 04/13/18 at 8:00am. Please be sure to bring your Photo ID, SSN, any insurance information and you discharge paperwork from the hospital.  Contact information: [address] [place] [address] [phone]           Plan Of  Care/Follow-up recommendations:  Activity:  ad lib  Antonieta Pert, MD 04/06/2018, 7:49 AM
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
## 2018-04-06 NOTE — Progress Notes (Signed)
Recreation Therapy Notes  Date: 6.19.19 Time: 1000 Location: 300 Hall Dayroom  Group Topic: Stress Management  Goal Area(s) Addresses:  Patient will verbalize importance of using healthy stress management.  Patient will identify positive emotions associated with healthy stress management.   Intervention: Stress Management  Activity : Guided Imagery.  LRT introduced the stress management technique of guided imagery.  LRT read a script that lead patients through the process of taking a mental vacation.  Patients were to follow along as LRT read script to engage in activity.  Education: Stress Management, Discharge Planning.   Education Outcome: Acknowledges edcuation/In group clarification offered/Needs additional education  Clinical Observations/Feedback: Pt did not attend group.    Caroll Rancher, LRT/CTRS          Caroll Rancher A 04/06/2018 11:53 AM

## 2018-05-20 ENCOUNTER — Encounter (HOSPITAL_COMMUNITY): Payer: Self-pay | Admitting: *Deleted

## 2018-05-20 ENCOUNTER — Emergency Department (HOSPITAL_COMMUNITY)
Admission: EM | Admit: 2018-05-20 | Discharge: 2018-05-21 | Disposition: A | Discharge status: Home / Self Care | Payer: Self-pay | Attending: Emergency Medicine | Admitting: Emergency Medicine

## 2018-05-20 ENCOUNTER — Other Ambulatory Visit: Payer: Self-pay

## 2018-05-20 DIAGNOSIS — Z046 Encounter for general psychiatric examination, requested by authority: Secondary | ICD-10-CM | POA: Insufficient documentation

## 2018-05-20 DIAGNOSIS — Z5321 Procedure and treatment not carried out due to patient leaving prior to being seen by health care provider: Secondary | ICD-10-CM | POA: Insufficient documentation

## 2018-05-20 NOTE — ED Triage Notes (Signed)
The pt was brought here by police officers  The pt was brought here because of a self inflicted cut on her arm?? Pt very unco-operative will not answer questions asked  Dressed like a male  When asked questions the responses are ask the police  I dont know ask them  No other information given

## 2018-05-20 NOTE — ED Triage Notes (Signed)
Pt refusing everything.

## 2018-05-20 NOTE — ED Notes (Addendum)
Pt refusing to give staff and police his Identification

## 2018-05-20 NOTE — ED Notes (Signed)
Pt refused blood work, stating "Don't touch me!"

## 2018-05-21 NOTE — ED Notes (Signed)
Pt called out to talk to staff. Pt cooroprative at this time. Pt stated he is not feeling suicidal and that the police officers on seen at the time thought the pt was being uncooperative and hostile. Police did not take out IVC paperwork on Pt. Sprite given to pt.

## 2018-05-21 NOTE — ED Notes (Signed)
Patient left ED at this time. 

## 2018-05-23 ENCOUNTER — Encounter (HOSPITAL_COMMUNITY): Payer: Self-pay | Admitting: Behavioral Health

## 2018-05-26 ENCOUNTER — Ambulatory Visit: Payer: Self-pay

## 2018-05-30 ENCOUNTER — Other Ambulatory Visit (HOSPITAL_COMMUNITY)
Admission: RE | Admit: 2018-05-30 | Discharge: 2018-05-30 | Disposition: A | Discharge status: Home / Self Care | Payer: Medicaid Other | Source: Ambulatory Visit | Attending: Infectious Diseases | Admitting: Infectious Diseases

## 2018-05-30 ENCOUNTER — Ambulatory Visit (INDEPENDENT_AMBULATORY_CARE_PROVIDER_SITE_OTHER): Payer: Self-pay | Admitting: Pharmacist

## 2018-05-30 DIAGNOSIS — B20 Human immunodeficiency virus [HIV] disease: Secondary | ICD-10-CM | POA: Insufficient documentation

## 2018-05-30 NOTE — Progress Notes (Signed)
HPI: Pedro Owens is a 27 y.o. male who presents to the [facility] clinic for HIV follow-up.  Patient Active Problem List   Diagnosis Date Noted  . Substance induced mood disorder ([place]) 04/03/2018  . Pressure injury of skin 11/26/2017  . SOB (shortness of breath)   . Septic embolism ([place])   . Transgender   . Pleural effusion, bacterial 11/23/2017  . Loculated pleural effusion 11/23/2017  . Hepatitis C antibody test positive 11/19/2017  . Septic pulmonary embolism ([place]) 11/19/2017  . Acute endocarditis   . Pleuritic chest pain   . IVDU (intravenous drug user)   . Endocarditis, suspected 11/18/2017  . SIRS (systemic inflammatory response syndrome) ([place]) 11/15/2017  . Injury, superficial, elbow, forearm, or wrist with infection, right, initial encounter 11/12/2016  . Abscess of right forearm 11/04/2016  . HIV disease ([place]) 04/07/2016  . Cigarette smoker 04/07/2016  . Polysubstance abuse ([place]) 04/07/2016  . Depression 08/14/2015  . Conversion disorder 08/14/2015  . Hemorrhoids, internal, with bleeding 08/25/2011  . Anal warts 08/25/2011  . ANXIETY 09/24/2010  . ADHD 09/24/2010    Patient's Medications  New Prescriptions   No medications on file  Previous Medications   ABACAVIR-DOLUTEGRAVIR-LAMIVUDINE (TRIUMEQ) 600-50-300 MG TABLET    Take 1 tablet by mouth daily.   ARIPIPRAZOLE (ABILIFY) 2 MG TABLET    Take 1 tablet (2 mg total) by mouth daily.   FLUOXETINE (PROZAC) 20 MG CAPSULE    Take 1 capsule (20 mg total) by mouth daily.   TRAZODONE (DESYREL) 100 MG TABLET    Take 1 tablet (100 mg total) by mouth at bedtime as needed for sleep.  Modified Medications   No medications on file  Discontinued Medications   HYDROXYZINE (ATARAX/VISTARIL) 25 MG TABLET    Take 1 tablet (25 mg total) by mouth every 6 (six) hours as needed for anxiety.    Allergies: Allergies  Allergen Reactions  . Acetaminophen Diarrhea    Flares up crohns disease  . Other     IV CONTRAST  .  Vicodin [Hydrocodone-Acetaminophen] Nausea And Vomiting  . Dilaudid [Hydromorphone Hcl] Hives and Rash    Past Medical History: Past Medical History:  Diagnosis Date  . ADHD (attention deficit hyperactivity disorder)   . Anal warts   . Anxiety   . Asthma   . Bipolar disorder ([place])   . Chronic bronchitis ([place])   . Chronic pain syndrome   . Convulsions ([place]) 08/14/2015  . Depression   . Gonorrhea 09/08/11  . Headache    "weekly" (11/06/2016)  . Hemorrhoids, internal, with bleeding 08/25/2011  . Hepatitis C   . Herpes   . HIV infection ([place])   . Hypertension   . ILEITIS 09/24/2010   Qualifier: Diagnosis of  By: Nelson-Smith CMA (AAMA), Dottie    . Lymphoid hyperplasia, reactive   . Marijuana abuse   . Migraine    "monthly" (11/06/2016)  . Pseudoseizures   . Seizures ([place])    "don't know why I have them" (11/06/2016)  . Syphilis     Social History: Social History   Socioeconomic History  . Marital status: Single    Spouse name: Not on file  . Number of children: 0  . Years of education: Not on file  . Highest education level: Not on file  Occupational History  . Occupation: Freight forwarder  Social Needs  . Financial resource strain: Not on file  . Food insecurity:    Worry: Not on file    Inability:  Not on file  . Transportation needs:    Medical: Not on file    Non-medical: Not on file  Tobacco Use  . Smoking status: Current Every Day Smoker    Packs/day: 1.50    Years: 11.00    Pack years: 16.50    Types: Cigarettes  Substance and Sexual Activity  . Alcohol use: Yes    Comment: 11/06/2016 "nothing in a very long time"  . Drug use: Yes    Frequency: 2.0 times per week    Types: Cocaine, Benzodiazepines, Oxycodone, Marijuana, Heroin    Comment: yesterday  . Sexual activity: Not Currently    Partners: Male    Birth control/protection: Condom  Lifestyle  . Physical activity:    Days per week: Not on file    Minutes per session: Not on file  . Stress: Not on file   Relationships  . Social connections:    Talks on phone: Not on file    Gets together: Not on file    Attends religious service: Not on file    Active member of club or organization: Not on file    Attends meetings of clubs or organizations: Not on file    Relationship status: Not on file  Other Topics Concern  . Not on file  Social History Narrative   ** Merged History Encounter **        Labs: Lab Results  Component Value Date   HIV1RNAQUANT 1,240 11/20/2017   HIV1RNAQUANT 2,050 (H) 01/06/2017   HIV1RNAQUANT <20 06/02/2016   CD4TABS 380 (L) 11/16/2017   CD4TABS 800 01/06/2017   CD4TABS 790 06/02/2016    RPR and STI Lab Results  Component Value Date   LABRPR Non Reactive 11/16/2017   LABRPR NON REAC 01/06/2017   LABRPR NON REAC 03/26/2016   LABRPR NON REACTIVE 08/01/2010    STI Results GC CT  03/26/2016 Negative Negative  09/08/2011 - NEGATIVE    Hepatitis B Lab Results  Component Value Date   HEPBSAB NEG 03/26/2016   HEPBSAG Negative 11/16/2017   HEPBCAB Negative 11/16/2017   Hepatitis C No results found for: HEPCAB, HCVRNAPCRQN Hepatitis A Lab Results  Component Value Date   HAV REACTIVE (A) 03/26/2016   Lipids: Lab Results  Component Value Date   CHOL 97 01/06/2017   TRIG 114 01/06/2017   HDL 39 (L) 01/06/2017   CHOLHDL 2.5 01/06/2017   VLDL 23 01/06/2017   LDLCALC 35 01/06/2017    Current HIV Regimen: Triumeq - out for 2 weeks  Assessment: [name] is here today to follow-up for his HIV infection. He has been out of care for awhile and was last seen by Dr. Megan Salon in June 2017. He was in [place] for rehab last year and came back but states he went back to rehab again recently.  He is now here to stay and lives with his mother who helps take care of him.  He told me that they were giving him Triumeq when he was in rehab in [place] and when he got back here in June, Maria Antonia gave him a bottle but now he has been out for 2 weeks.  He  states he had a lapse between rehab and behavior health as well. He stated that Triumeq works ok for him - "it's big but I take it". I asked him if he has remained drug free since rehab and he stated that he has but everything has been really hard.  It looks like he was in  the ED/behavior health for suicide ideation back in June/July. He was seeing Judeen Hammans before he left and is interested in our counseling services again.  Rollene Fare was out sick today so she couldn't meet him, but he made an appointment to see her tomorrow.  Audelia Hives also met with him to help as well.  He does not have Medicaid anymore so he met with Sharyn Lull to apply for HMAP. I was able to get him a bottle a Triumeq today, and I will see him again in ~3 weeks to get him another bottle if needed before HMAP kicks in and to review his labs.  I will check HIV labs today with resistance and maintenance labs as well.  Will have him see Dr. Megan Salon again in a few months if he can keep his appointments.  Plan: - Continue Triumeq PO once daily for now - HIV VL with reflex, CD4, CBC, CMET, RPR, urine cytology - F/u with me again 9/10 at 315pm  Mariena Meares L. Lexianna Weinrich, PharmD, [facility] 05/30/2018, 4:34 PM

## 2018-05-31 ENCOUNTER — Ambulatory Visit: Payer: Self-pay | Admitting: Licensed Clinical Social Worker

## 2018-06-01 LAB — URINE CYTOLOGY ANCILLARY ONLY
Chlamydia: NEGATIVE
Neisseria Gonorrhea: NEGATIVE

## 2018-06-01 LAB — T-HELPER CELL (CD4) - (RCID CLINIC ONLY)
CD4 % Helper T Cell: 33 % (ref 33–55)
CD4 T Cell Abs: 470 /uL (ref 400–2700)

## 2018-06-08 LAB — COMPREHENSIVE METABOLIC PANEL
AG RATIO: 1.2 (calc) (ref 1.0–2.5)
ALBUMIN MSPROF: 4.1 g/dL (ref 3.6–5.1)
ALT: 39 U/L (ref 9–46)
AST: 40 U/L (ref 10–40)
Alkaline phosphatase (APISO): 89 U/L (ref 40–115)
BILIRUBIN TOTAL: 0.5 mg/dL (ref 0.2–1.2)
BUN/Creatinine Ratio: 23 (calc) — ABNORMAL HIGH (ref 6–22)
BUN: 26 mg/dL — ABNORMAL HIGH (ref 7–25)
CALCIUM: 9.4 mg/dL (ref 8.6–10.3)
CO2: 28 mmol/L (ref 20–32)
Chloride: 105 mmol/L (ref 98–110)
Creat: 1.15 mg/dL (ref 0.60–1.35)
GLOBULIN: 3.4 g/dL (ref 1.9–3.7)
Glucose, Bld: 88 mg/dL (ref 65–99)
POTASSIUM: 3.8 mmol/L (ref 3.5–5.3)
Sodium: 140 mmol/L (ref 135–146)
Total Protein: 7.5 g/dL (ref 6.1–8.1)

## 2018-06-08 LAB — CBC WITH DIFFERENTIAL/PLATELET
Basophils Absolute: 16 cells/uL (ref 0–200)
Basophils Relative: 0.2 %
EOS ABS: 361 {cells}/uL (ref 15–500)
Eosinophils Relative: 4.4 %
HCT: 44.8 % (ref 38.5–50.0)
Hemoglobin: 15.3 g/dL (ref 13.2–17.1)
Lymphs Abs: 1427 cells/uL (ref 850–3900)
MCH: 29.3 pg (ref 27.0–33.0)
MCHC: 34.2 g/dL (ref 32.0–36.0)
MCV: 85.7 fL (ref 80.0–100.0)
MONOS PCT: 6.1 %
MPV: 10.2 fL (ref 7.5–12.5)
Neutro Abs: 5896 cells/uL (ref 1500–7800)
Neutrophils Relative %: 71.9 %
Platelets: 350 10*3/uL (ref 140–400)
RBC: 5.23 10*6/uL (ref 4.20–5.80)
RDW: 13.8 % (ref 11.0–15.0)
Total Lymphocyte: 17.4 %
WBC mixed population: 500 cells/uL (ref 200–950)
WBC: 8.2 10*3/uL (ref 3.8–10.8)

## 2018-06-08 LAB — HIV-1 INTEGRASE GENOTYPE

## 2018-06-08 LAB — HIV RNA, RTPCR W/R GT (RTI, PI,INT)
HIV 1 RNA Quant: 15400 copies/mL — ABNORMAL HIGH
HIV-1 RNA Quant, Log: 4.19 Log copies/mL — ABNORMAL HIGH

## 2018-06-08 LAB — RPR: RPR Ser Ql: NONREACTIVE

## 2018-06-08 LAB — HIV-1 GENOTYPE: HIV-1 Genotype: DETECTED — AB

## 2018-06-14 ENCOUNTER — Encounter: Payer: Self-pay | Admitting: Infectious Diseases

## 2018-06-28 ENCOUNTER — Ambulatory Visit: Payer: Self-pay

## 2018-07-11 ENCOUNTER — Ambulatory Visit (INDEPENDENT_AMBULATORY_CARE_PROVIDER_SITE_OTHER): Payer: Self-pay | Admitting: Licensed Clinical Social Worker

## 2018-07-11 ENCOUNTER — Ambulatory Visit (INDEPENDENT_AMBULATORY_CARE_PROVIDER_SITE_OTHER): Payer: Self-pay | Admitting: Pharmacist

## 2018-07-11 DIAGNOSIS — F331 Major depressive disorder, recurrent, moderate: Secondary | ICD-10-CM

## 2018-07-11 DIAGNOSIS — B20 Human immunodeficiency virus [HIV] disease: Secondary | ICD-10-CM

## 2018-07-11 MED ORDER — ABACAVIR-DOLUTEGRAVIR-LAMIVUD 600-50-300 MG PO TABS
1.0000 | ORAL_TABLET | Freq: Every day | ORAL | 5 refills | Status: DC
Start: 2018-07-11 — End: 2019-04-03

## 2018-07-11 NOTE — Progress Notes (Signed)
HPI: Pedro Owens is a 27 y.o. male who presents to the [facility] pharmacy clinic for his HIV follow-up visit.  Patient Active Problem List   Diagnosis Date Noted  . Substance induced mood disorder ([facility]) 04/03/2018  . Pressure injury of skin 11/26/2017  . SOB (shortness of breath)   . Septic embolism ([facility])   . Transgender   . Pleural effusion, bacterial 11/23/2017  . Loculated pleural effusion 11/23/2017  . Hepatitis C antibody test positive 11/19/2017  . Septic pulmonary embolism ([facility]) 11/19/2017  . Acute endocarditis   . Pleuritic chest pain   . IVDU (intravenous drug user)   . Endocarditis, suspected 11/18/2017  . SIRS (systemic inflammatory response syndrome) ([facility]) 11/15/2017  . Injury, superficial, elbow, forearm, or wrist with infection, right, initial encounter 11/12/2016  . Abscess of right forearm 11/04/2016  . HIV disease ([facility]) 04/07/2016  . Cigarette smoker 04/07/2016  . Polysubstance abuse ([facility]) 04/07/2016  . Depression 08/14/2015  . Conversion disorder 08/14/2015  . Hemorrhoids, internal, with bleeding 08/25/2011  . Anal warts 08/25/2011  . ANXIETY 09/24/2010  . ADHD 09/24/2010    Patient's Medications  New Prescriptions   No medications on file  Previous Medications   ABACAVIR-DOLUTEGRAVIR-LAMIVUDINE (TRIUMEQ) 600-50-300 MG TABLET    Take 1 tablet by mouth daily.   ARIPIPRAZOLE (ABILIFY) 2 MG TABLET    Take 1 tablet (2 mg total) by mouth daily.   FLUOXETINE (PROZAC) 20 MG CAPSULE    Take 1 capsule (20 mg total) by mouth daily.   TRAZODONE (DESYREL) 100 MG TABLET    Take 1 tablet (100 mg total) by mouth at bedtime as needed for sleep.  Modified Medications   No medications on file  Discontinued Medications   No medications on file    Allergies: Allergies  Allergen Reactions  . Acetaminophen Diarrhea    Flares up crohns disease  . Other     IV CONTRAST  . Vicodin [Hydrocodone-Acetaminophen] Nausea And Vomiting  . Dilaudid [Hydromorphone Hcl] Hives and  Rash    Past Medical History: Past Medical History:  Diagnosis Date  . ADHD (attention deficit hyperactivity disorder)   . Anal warts   . Anxiety   . Asthma   . Bipolar disorder ([facility])   . Chronic bronchitis ([facility])   . Chronic pain syndrome   . Convulsions ([facility]) 08/14/2015  . Depression   . Gonorrhea 09/08/11  . Headache    "weekly" (11/06/2016)  . Hemorrhoids, internal, with bleeding 08/25/2011  . Hepatitis C   . Herpes   . HIV infection ([facility])   . Hypertension   . ILEITIS 09/24/2010   Qualifier: Diagnosis of  By: Nelson-Smith CMA (AAMA), Dottie    . Lymphoid hyperplasia, reactive   . Marijuana abuse   . Migraine    "monthly" (11/06/2016)  . Pseudoseizures   . Seizures ([facility])    "don't know why I have them" (11/06/2016)  . Syphilis     Social History: Social History   Socioeconomic History  . Marital status: Single    Spouse name: Not on file  . Number of children: 0  . Years of education: Not on file  . Highest education level: Not on file  Occupational History  . Occupation: Production designer, theatre/television/film  Social Needs  . Financial resource strain: Not on file  . Food insecurity:    Worry: Not on file    Inability: Not on file  . Transportation needs:    Medical: Not on file    Non-medical:  Not on file  Tobacco Use  . Smoking status: Current Every Day Smoker    Packs/day: 1.50    Years: 11.00    Pack years: 16.50    Types: Cigarettes  Substance and Sexual Activity  . Alcohol use: Yes    Comment: 11/06/2016 "nothing in a very long time"  . Drug use: Yes    Frequency: 2.0 times per week    Types: Cocaine, Benzodiazepines, Oxycodone, Marijuana, Heroin    Comment: yesterday  . Sexual activity: Not Currently    Partners: Male    Birth control/protection: Condom  Lifestyle  . Physical activity:    Days per week: Not on file    Minutes per session: Not on file  . Stress: Not on file  Relationships  . Social connections:    Talks on phone: Not on file    Gets together: Not on  file    Attends religious service: Not on file    Active member of club or organization: Not on file    Attends meetings of clubs or organizations: Not on file    Relationship status: Not on file  Other Topics Concern  . Not on file  Social History Narrative   ** Merged History Encounter **        Labs: Lab Results  Component Value Date   HIV1RNAQUANT 15,400 (H) 05/30/2018   HIV1RNAQUANT 1,240 11/20/2017   HIV1RNAQUANT 2,050 (H) 01/06/2017   CD4TABS 470 05/30/2018   CD4TABS 380 (L) 11/16/2017   CD4TABS 800 01/06/2017    RPR and STI Lab Results  Component Value Date   LABRPR NON-REACTIVE 05/30/2018   LABRPR Non Reactive 11/16/2017   LABRPR NON REAC 01/06/2017   LABRPR NON REAC 03/26/2016   LABRPR NON REACTIVE 08/01/2010    STI Results GC CT  05/30/2018 Negative Negative  03/26/2016 Negative Negative  09/08/2011 - NEGATIVE    Hepatitis B Lab Results  Component Value Date   HEPBSAB NEG 03/26/2016   HEPBSAG Negative 11/16/2017   HEPBCAB Negative 11/16/2017   Hepatitis C No results found for: HEPCAB, HCVRNAPCRQN Hepatitis A Lab Results  Component Value Date   HAV REACTIVE (A) 03/26/2016   Lipids: Lab Results  Component Value Date   CHOL 97 01/06/2017   TRIG 114 01/06/2017   HDL 39 (L) 01/06/2017   CHOLHDL 2.5 01/06/2017   VLDL 23 01/06/2017   LDLCALC 35 01/06/2017    Current HIV Regimen: Triumeq  Assessment: Talen says he is feeling tired but has not had any issues with his Triumeq. He reports that since we last saw him in August, he has not missed any doses, but that he is almost out of medication. His ADAP has been approved so we will send a new prescription to the [facility] on [address] for him. We will also re-check his viral load today.  Plan: -Continue Triumeq -HIV viral load today -F/u with Dr. Orvan Falconer on 10/31 at 9:30AM  Arvilla Market, PharmD PGY1 Pharmacy Resident Phone [phone] 07/11/2018     10:19 AM

## 2018-07-11 NOTE — BH Specialist Note (Addendum)
Integrated Behavioral Health Initial Visit  MRN: [id number] Name: Pedro Owens  Number of Integrated Behavioral Health Clinician visits:: 1/6 Session Start time: 9:33am  Session End time: 9:55am Total time: 20 minutes  Type of Service: Integrated Behavioral Health- Individual/Family Interpretor:No. Interpretor Name and Language: n/a   Warm Hand Off Completed.       SUBJECTIVE: Pedro Owens is a 27 y.o. male accompanied by self Patient was referred by Rexene Alberts for depressive symptoms. Patient reports the following symptoms/concerns: depressed mood, flat affect, not sleeping, not eating, no energy, no motivation, not caring about anything, not enjoying activities, not wanting to see or talk to anyone Duration of problem: unknwon; Severity of problem: moderate  OBJECTIVE: Mood: Depressed and Affect: Depressed Risk of harm to self or others: No plan to harm self or others  LIFE CONTEXT: Patient is living in [place] since he came out of rehab. He has his own apartment, but mom is supporting him and he reports understanding that at some point he will no longer be able to live on his own unless he can find a way to pay for it. Patient states that he was working in [employer] for 8 years at the same place, until he lost his job in May. He has not been able to find anything else, and really doesn't want to work in that job any longer. Patient reports that he grew up in [place], but that his mom is his only support, and he is not connected to any formal support systems.  GOALS ADDRESSED: Patient will: 1. Reduce symptoms of: depression   INTERVENTIONS: Interventions utilized: Motivational Interviewing, Solution-Focused Strategies and Supportive Counseling    ASSESSMENT: Patient currently experiencing depressed mood, flat affect, anhedonia, apathy, lack of energy and motivation, tendency to isolate, insomnia, not eating much, and avoidance of social situations.  The diagnosis that most accurately reflects these symptoms is Major Depressive Disorder, Recurrent, Moderate. Patient is not easily engaged, states that he doesn't like talking right now, but will answer questions. Counselor guided patient to identify what he wants to see change in his life. Patient indicates that he wants to feel better, so that he can engage in life again and have energy and care about things. Counselor encouraged patient to share about his substance use and how that is going since leaving rehab. Patient would not talk about this, stating "It's going but it should be better than it is." Counselor explored with patient what it might take to get back to this place. Patient expresses fatigue and confusion about how he can make these changes. Counselor discussed self-care and self-advocacy with patient. Patient reports that he was on anti-depressants (Prozac and Abilify) in the past, but was only given enough to get him through until he could find a prescriber, which he did not do. He states he is unsure whether they were helpful because he took them for a little less than a month. Counselor provided patient with orange card application and educated him on how to apply and how to use the benefits once approved. Counselor also provided patient with information about [facility], and pointed out to him the free lunches, as he states that sometimes he would like to eat but has no food. Counselor educated patient on available services and encouraged him to make contact if he would like to set further appointments.    Patient may benefit from ongoing CBT and Reality Therapy.  PLAN: 1. Patient will call if he decides  to continue with counseling sessions.   Angus Palms, LCSW

## 2018-07-13 LAB — HIV-1 RNA QUANT-NO REFLEX-BLD
HIV 1 RNA Quant: 20400 copies/mL — ABNORMAL HIGH
HIV-1 RNA QUANT, LOG: 4.31 {Log_copies}/mL — AB

## 2018-08-01 ENCOUNTER — Telehealth: Payer: Self-pay | Admitting: Behavioral Health

## 2018-08-01 ENCOUNTER — Ambulatory Visit (INDEPENDENT_AMBULATORY_CARE_PROVIDER_SITE_OTHER): Payer: Self-pay | Admitting: Licensed Clinical Social Worker

## 2018-08-01 DIAGNOSIS — F331 Major depressive disorder, recurrent, moderate: Secondary | ICD-10-CM

## 2018-08-01 NOTE — Telephone Encounter (Signed)
Patient's mother called and left voicemail for some one to call her back.  She stated she wanted to alert someone about a situation in [employer].  Called her back, no answer left generic voicemail for her to call the office back. Angeline Slim RN

## 2018-08-01 NOTE — BH Specialist Note (Signed)
Integrated Behavioral Health Follow Up Visit  MRN: [id number] Name: Pedro Owens  Number of Integrated Behavioral Health Clinician visits: 2/6 Session Start time: 3:51pm  Session End time: 4:32pm Total time: 40 minutes  Type of Service: Integrated Behavioral Health- Individual/Family Interpretor:No. Interpretor Name and Language: n/a  SUBJECTIVE: Pedro Owens is a 27 y.o. male accompanied by self Patient reports the following symptoms/concerns: depressed mood, crying a lot, cannot motivate self to go to work, not wanting to be around people, not enjoying activities he used to, feeling overwhelmed, feeling hopeless, non-suicidal/parasuicidal self-harm   OBJECTIVE: Mood: Depressed and Affect: Depressed Risk of harm to self or others: No plan to harm self or others  LIFE CONTEXT: Patient reports that her significant other got out of jail last Monday after 4 months, and proceeded to tell him that they are no longer together. He states that S.O. Initially said he had a lot going for him and could not risk being around someone who is still using (patient), but later asked patient for drugs. Patient states that he has been crying since Monday, and that he cut his arm on that day (cuts are obvious on the top of patient's wrist, not near any major veins, and not of significant depth). Currently patient has work coming in through a [employer], but he has been unable to make himself go to his assignments lately. Patient reports little support from family, who consider him beneath them due to his crack cocaine use when they "only" use marijuana. He states that he considers the two dependences to be the same, but family does not see it that way. Main support is roommate/best friend, who also uses crack with patient.   GOALS ADDRESSED: Patient will: 1.  Reduce symptoms of: depression and substance abuse    INTERVENTIONS: Interventions utilized:  Motivational Interviewing, Behavioral Activation  and Supportive Counseling  ASSESSMENT: Patient currently experiencing depressed mood, flat affect, anhedonia, parasuicidal self-injury (denies any SI), feelings of hopelessness, experience of being overwhelmed by difficult emotions (hurt, betrayal, disloyalty), and lack of energy & motivation. Counselor explored with patient his recent experiences. Patient reports that he has gone back to smoking crack, but that he does not want to. He clarifies that there are lots of things he can only do when he is high, such as cooking, cleaning, doing hair and makeup, being social. Counselor processed with patient his experience of being high calming his fears about being unable to do these things well, while offering him some level of energy/motivation. Patient states he does not know any other way to deal with his emotions than getting high, though he now wants to learn those ways so that he doesn't use anymore. He shares how he wants to return to [facility] in [place] for a 3rd time in rehab. Counselor guided patient to identify the reasons he hopes to do this. Patient talked about learning a lot from his first 2 stays, but indicated that he was not there with the desire of getting/staying clean those times. Counselor commended patient on recognizing this and on his current desire to make a change. Patient and counselor discussed obstacles and  brainstormed ways to overcome them. Counselor emphasized the importance of breaking down goals into small steps and encouraged patient to make tiny goals and then celebrate achieving them as victories. Counselor emphasized progress over perfection, and identified some goals patient may start with.   Patient may benefit from ongoing CBT and recovery focused Reality Therapy.  PLAN: 1. Follow up with behavioral health clinician on : 08/18/18 @ 11am  Angus Palms, LCSW

## 2018-08-18 ENCOUNTER — Ambulatory Visit: Payer: Medicaid Other | Admitting: Licensed Clinical Social Worker

## 2018-08-18 ENCOUNTER — Ambulatory Visit: Payer: Medicaid Other | Admitting: Internal Medicine

## 2018-09-24 ENCOUNTER — Encounter (HOSPITAL_COMMUNITY): Payer: Self-pay | Admitting: Emergency Medicine

## 2018-09-24 ENCOUNTER — Emergency Department (HOSPITAL_COMMUNITY)
Admission: EM | Admit: 2018-09-24 | Discharge: 2018-09-25 | Disposition: A | Discharge status: Home / Self Care | Payer: Medicaid Other | Attending: Emergency Medicine | Admitting: Emergency Medicine

## 2018-09-24 ENCOUNTER — Other Ambulatory Visit: Payer: Self-pay

## 2018-09-24 DIAGNOSIS — J45909 Unspecified asthma, uncomplicated: Secondary | ICD-10-CM | POA: Insufficient documentation

## 2018-09-24 DIAGNOSIS — F332 Major depressive disorder, recurrent severe without psychotic features: Secondary | ICD-10-CM | POA: Insufficient documentation

## 2018-09-24 DIAGNOSIS — Y999 Unspecified external cause status: Secondary | ICD-10-CM | POA: Insufficient documentation

## 2018-09-24 DIAGNOSIS — F329 Major depressive disorder, single episode, unspecified: Secondary | ICD-10-CM

## 2018-09-24 DIAGNOSIS — Z79899 Other long term (current) drug therapy: Secondary | ICD-10-CM | POA: Insufficient documentation

## 2018-09-24 DIAGNOSIS — F209 Schizophrenia, unspecified: Secondary | ICD-10-CM | POA: Insufficient documentation

## 2018-09-24 DIAGNOSIS — F32A Depression, unspecified: Secondary | ICD-10-CM

## 2018-09-24 DIAGNOSIS — F1524 Other stimulant dependence with stimulant-induced mood disorder: Secondary | ICD-10-CM | POA: Insufficient documentation

## 2018-09-24 DIAGNOSIS — W2209XA Striking against other stationary object, initial encounter: Secondary | ICD-10-CM | POA: Insufficient documentation

## 2018-09-24 DIAGNOSIS — F1424 Cocaine dependence with cocaine-induced mood disorder: Secondary | ICD-10-CM | POA: Insufficient documentation

## 2018-09-24 DIAGNOSIS — S0083XA Contusion of other part of head, initial encounter: Secondary | ICD-10-CM | POA: Insufficient documentation

## 2018-09-24 DIAGNOSIS — F17210 Nicotine dependence, cigarettes, uncomplicated: Secondary | ICD-10-CM | POA: Insufficient documentation

## 2018-09-24 DIAGNOSIS — F1910 Other psychoactive substance abuse, uncomplicated: Secondary | ICD-10-CM | POA: Diagnosis present

## 2018-09-24 DIAGNOSIS — F1994 Other psychoactive substance use, unspecified with psychoactive substance-induced mood disorder: Secondary | ICD-10-CM

## 2018-09-24 DIAGNOSIS — Y939 Activity, unspecified: Secondary | ICD-10-CM | POA: Insufficient documentation

## 2018-09-24 DIAGNOSIS — Y929 Unspecified place or not applicable: Secondary | ICD-10-CM | POA: Insufficient documentation

## 2018-09-24 DIAGNOSIS — B20 Human immunodeficiency virus [HIV] disease: Secondary | ICD-10-CM | POA: Insufficient documentation

## 2018-09-24 LAB — ACETAMINOPHEN LEVEL: Acetaminophen (Tylenol), Serum: <10 ug/mL — ABNORMAL LOW (ref 10–30)

## 2018-09-24 LAB — COMPREHENSIVE METABOLIC PANEL
ALT: 68 U/L — ABNORMAL HIGH (ref 0–44)
ANION GAP: 10 (ref 5–15)
AST: 52 U/L — ABNORMAL HIGH (ref 15–41)
Albumin: 4 g/dL (ref 3.5–5.0)
Alkaline Phosphatase: 73 U/L (ref 38–126)
BUN: 27 mg/dL — ABNORMAL HIGH (ref 6–20)
CALCIUM: 8.7 mg/dL — AB (ref 8.9–10.3)
CO2: 23 mmol/L (ref 22–32)
Chloride: 107 mmol/L (ref 98–111)
Creatinine, Ser: 1.24 mg/dL (ref 0.61–1.24)
GFR calc non Af Amer: >60 mL/min (ref >60)
Glucose, Bld: 125 mg/dL — ABNORMAL HIGH (ref 70–99)
Potassium: 3.6 mmol/L (ref 3.5–5.1)
SODIUM: 140 mmol/L (ref 135–145)
Total Bilirubin: 0.8 mg/dL (ref 0.3–1.2)
Total Protein: 7.6 g/dL (ref 6.5–8.1)

## 2018-09-24 LAB — CBC
HCT: 47.6 % (ref 39.0–52.0)
Hemoglobin: 15.8 g/dL (ref 13.0–17.0)
MCH: 28.6 pg (ref 26.0–34.0)
MCHC: 33.2 g/dL (ref 30.0–36.0)
MCV: 86.2 fL (ref 80.0–100.0)
Platelets: 367 10*3/uL (ref 150–400)
RBC: 5.52 MIL/uL (ref 4.22–5.81)
RDW: 13.2 % (ref 11.5–15.5)
WBC: 8.4 10*3/uL (ref 4.0–10.5)
nRBC: 0 % (ref 0.0–0.2)

## 2018-09-24 LAB — SALICYLATE LEVEL

## 2018-09-24 LAB — ETHANOL: Alcohol, Ethyl (B): <10 mg/dL (ref <10)

## 2018-09-24 MED ORDER — IBUPROFEN 200 MG PO TABS
600.0000 mg | ORAL_TABLET | Freq: Once | ORAL | Status: AC
  Administered 2018-09-24: 600 mg via ORAL
  Filled 2018-09-24: qty 3

## 2018-09-24 MED ORDER — ACETAMINOPHEN 325 MG PO TABS: 650.0000 mg | ORAL_TABLET | Freq: Once | ORAL | Status: DC

## 2018-09-24 NOTE — ED Triage Notes (Addendum)
Patient brought in by [facility] due to family calling stating patient was banging head against wall. They could not get him to stop. Patient has a psych hx. Patient states he does not know why he was doing that. Asked was he trying to kill himself, patient stated he does not right now. Patient has a knot on his forehead.

## 2018-09-24 NOTE — ED Provider Notes (Signed)
[facility]-EMERGENCY DEPT Provider Note   CSN: [id number] Arrival date & time: 09/24/18  1834     History   Chief Complaint Chief Complaint  Patient presents with  . Suicidal    HPI Pedro Owens is a 27 y.o. male.  HPI Patient is a 27 year old male who is brought to the emergency department after he was noted by his family to be striking his head against the wall.  He has a history of psychiatric illness.  He states he had a tough day.  He continues to abuse drugs.  His family called to have him come to the emergency department.  He is eating a sandwich at this time.  He has no complaints except for very mild headache and frontal hematoma.  He is not on anticoagulation.  He denies suicidal or homicidal thoughts.  He states this was not an attempt to kill himself.  He has a history of cutting behavior.  He states this was in a similar vane   Past Medical History:  Diagnosis Date  . ADHD (attention deficit hyperactivity disorder)   . Anal warts   . Anxiety   . Asthma   . Bipolar disorder (HCC)   . Chronic bronchitis (HCC)   . Chronic pain syndrome   . Convulsions (HCC) 08/14/2015  . Depression   . Gonorrhea 09/08/11  . Headache    "weekly" (11/06/2016)  . Hemorrhoids, internal, with bleeding 08/25/2011  . Hepatitis C   . Herpes   . HIV infection (HCC)   . Hypertension   . ILEITIS 09/24/2010   Qualifier: Diagnosis of  By: Nelson-Smith CMA (AAMA), Dottie    . Lymphoid hyperplasia, reactive   . Marijuana abuse   . Migraine    "monthly" (11/06/2016)  . Pseudoseizures   . Seizures (HCC)    "don't know why I have them" (11/06/2016)  . Syphilis     Patient Active Problem List   Diagnosis Date Noted  . Substance induced mood disorder (HCC) 04/03/2018  . Pressure injury of skin 11/26/2017  . SOB (shortness of breath)   . Septic embolism (HCC)   . Transgender   . Pleural effusion, bacterial 11/23/2017  . Loculated pleural effusion 11/23/2017  .  Hepatitis C antibody test positive 11/19/2017  . Septic pulmonary embolism (HCC) 11/19/2017  . Acute endocarditis   . Pleuritic chest pain   . IVDU (intravenous drug user)   . Endocarditis, suspected 11/18/2017  . SIRS (systemic inflammatory response syndrome) (HCC) 11/15/2017  . Injury, superficial, elbow, forearm, or wrist with infection, right, initial encounter 11/12/2016  . Abscess of right forearm 11/04/2016  . HIV disease (HCC) 04/07/2016  . Cigarette smoker 04/07/2016  . Polysubstance abuse (HCC) 04/07/2016  . Depression 08/14/2015  . Conversion disorder 08/14/2015  . Hemorrhoids, internal, with bleeding 08/25/2011  . Anal warts 08/25/2011  . ANXIETY 09/24/2010  . ADHD 09/24/2010    Past Surgical History:  Procedure Laterality Date  . ANKLE SURGERY Bilateral 2005   Screw & Pins  . ANKLE SURGERY Right 2008   "replaced pins & screws"  . FOOT SURGERY Bilateral 2005   "bone spurs"  . I&D EXTREMITY Right 06/26/2016   Procedure: IRRIGATION AND DEBRIDEMENT RIGHT FOREARM;  Surgeon: Betha Loa, MD;  Location: [facility] OR;  Service: Orthopedics;  Laterality: Right;  . I&D EXTREMITY Right 11/04/2016   Procedure: IRRIGATION AND DEBRIDEMENT EXTREMITY;  Surgeon: Dairl Ponder, MD;  Location: [facility] OR;  Service: Orthopedics;  Laterality: Right;  . I&D  EXTREMITY Right 11/06/2016   Procedure: IRRIGATION AND DEBRIDEMENT right forarm;  Surgeon: Dairl Ponder, MD;  Location: [facility] OR;  Service: Orthopedics;  Laterality: Right;  . TEE WITHOUT CARDIOVERSION N/A 11/19/2017   Procedure: TRANSESOPHAGEAL ECHOCARDIOGRAM (TEE);  Surgeon: Pricilla Riffle, MD;  Location: [facility] ENDOSCOPY;  Service: Cardiovascular;  Laterality: N/A;  . VIDEO ASSISTED THORACOSCOPY (VATS)/EMPYEMA Left 11/26/2017   Procedure: VIDEO ASSISTED THORACOSCOPY (VATS)/DRAIN EMPYEMA;  Surgeon: Kerin Perna, MD;  Location: [facility] OR;  Service: Thoracic;  Laterality: Left;        Home Medications    Prior to Admission medications   Medication  Sig Start Date End Date Taking? Authorizing Provider  abacavir-dolutegravir-lamiVUDine (TRIUMEQ) 600-50-300 MG tablet Take 1 tablet by mouth daily. 07/11/18   Kuppelweiser, Cassie L, RPH-CPP  ARIPiprazole (ABILIFY) 2 MG tablet Take 1 tablet (2 mg total) by mouth daily. 04/07/18   Denzil Magnuson, NP  FLUoxetine (PROZAC) 20 MG capsule Take 1 capsule (20 mg total) by mouth daily. 04/07/18   Denzil Magnuson, NP  traZODone (DESYREL) 100 MG tablet Take 1 tablet (100 mg total) by mouth at bedtime as needed for sleep. 04/06/18   Denzil Magnuson, NP  divalproex (DEPAKOTE) 500 MG DR tablet Take 1 tablet (500 mg total) by mouth 2 (two) times daily. 02/28/16 02/28/16  Lindalou Hose, MD    Family History Family History  Problem Relation Age of Onset  . Diabetes Mother   . Irritable bowel syndrome Mother   . Hypertension Mother   . Hyperlipidemia Mother   . Hypothyroidism Mother   . Diabetes Maternal Uncle   . Heart disease Maternal Grandmother   . Leukemia Other        maternal greatgrandmother  . Colon cancer Neg Hx     Social History Social History   Tobacco Use  . Smoking status: Current Every Day Smoker    Packs/day: 1.50    Years: 11.00    Pack years: 16.50    Types: Cigarettes  Substance Use Topics  . Alcohol use: Yes    Comment: 11/06/2016 "nothing in a very long time"  . Drug use: Yes    Frequency: 2.0 times per week    Types: Cocaine, Benzodiazepines, Oxycodone, Marijuana, Heroin    Comment: yesterday     Allergies   Acetaminophen; Other; Vicodin [hydrocodone-acetaminophen]; and Dilaudid [hydromorphone hcl]   Review of Systems Review of Systems  All other systems reviewed and are negative.    Physical Exam Updated Vital Signs BP (!) 134/94 (BP Location: Left Arm)   Pulse 92   Temp 98.7 F (37.1 C) (Oral)   Resp 18   Ht 5\' 11"  (1.803 m)   Wt 61.2 kg   SpO2 97%   BMI 18.83 kg/m   Physical Exam  Constitutional: He is oriented to person, place, and time. He  appears well-developed and well-nourished.  HENT:  Head: Normocephalic.  Small hematoma forehead.  No active bleeding.  Eyes: EOM are normal.  Neck: Normal range of motion.  Cardiovascular: Normal rate and regular rhythm.  Pulmonary/Chest: Effort normal.  Abdominal: Soft. He exhibits no distension.  Musculoskeletal: Normal range of motion.  Neurological: He is alert and oriented to person, place, and time.  Skin: Skin is warm and dry.  Psychiatric: He has a normal mood and affect.  Nursing note and vitals reviewed.    ED Treatments / Results  Labs (all labs ordered are listed, but only abnormal results are displayed) Labs Reviewed  COMPREHENSIVE METABOLIC PANEL  ETHANOL  SALICYLATE LEVEL  ACETAMINOPHEN LEVEL  CBC  RAPID URINE DRUG SCREEN, HOSP PERFORMED    EKG None  Radiology No results found.  Procedures Procedures (including critical care time)  Medications Ordered in ED Medications  acetaminophen (TYLENOL) tablet 650 mg (has no administration in time range)     Initial Impression / Assessment and Plan / ED Course  I have reviewed the triage vital signs and the nursing notes.  Pertinent labs & imaging results that were available during my care of the patient were reviewed by me and considered in my medical decision making (see chart for details).     Medically clear at this time.  I do not believe the patient is a threat to himself or others at this time.  He does not need CT imaging of his head at this time.  Safe for discharge home.  Outpatient primary care and follow-up at [facility] recommended.  No indication for involuntary commitment.  Final Clinical Impressions(s) / ED Diagnoses   Final diagnoses:  Depression, unspecified depression type    ED Discharge Orders    None       Azalia Bilis, MD 09/24/18 [phone]

## 2018-09-24 NOTE — ED Notes (Signed)
Pt changed into burgundy scrubs and yellow socks, all belongings placed into pt belonging bag and placed into cabinet labeled "[address], [address]".

## 2018-09-24 NOTE — ED Provider Notes (Signed)
pts family is concerned about his mental health stability. Pt and family requesting TTS evaluation. This will be obtained.     Azalia Bilis, MD 09/24/18 2017

## 2018-09-24 NOTE — ED Notes (Signed)
Upon assessment, patient states that he has had a lot going on and combined with his drug use had a "mental breakdown" today. Patient admits to shooting up "ice" (meth) and smoking crack cocaine. Patient complaining of a headache but no other pain. Patient denies SI/HI at the moment, but states he had thoughts earlier during his episode.

## 2018-09-25 LAB — RAPID URINE DRUG SCREEN, HOSP PERFORMED
Amphetamines: NOT DETECTED
BARBITURATES: NOT DETECTED
Benzodiazepines: NOT DETECTED
COCAINE: POSITIVE — AB
Opiates: NOT DETECTED
Tetrahydrocannabinol: NOT DETECTED

## 2018-09-25 MED ORDER — GABAPENTIN 300 MG PO CAPS
300.0000 mg | ORAL_CAPSULE | Freq: Two times a day (BID) | ORAL | Status: DC
  Administered 2018-09-25: 300 mg via ORAL
  Filled 2018-09-25: qty 1

## 2018-09-25 NOTE — ED Notes (Signed)
I gave patient his discharge instructions and patient became very agitated.  He grabbed his bag of clothes out of my hand and ripped them open.  He was loud and angry stating that he wanted to go to Behavior Health in the first place.  Security came and escorted patient out with all of his belongings .
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
## 2018-09-25 NOTE — ED Notes (Signed)
Pt is upset that he is discharged.  Pt called his mother who was also concerned.  Pt told his mother that he feels like he is going crazy but patient has not mentioned any of this today.  Pt had only told me that he needed help getting off of drugs.  Pt was told that he could always come back or go to another hospital.  He was also told that it was important to get back on his medication.

## 2018-09-25 NOTE — ED Notes (Signed)
Belongings locked up in locker # 35. Gold colored chain necklace, purple color hair tie, gray sweatpants, black underwear, 4 bras, socks, sandals- 1 pair, stripe shirt, jacket.

## 2018-09-25 NOTE — ED Notes (Signed)
Pt oriented to room and unit.  Pt is pleasant and cooperative.  Pt contracts for safety but appears somewhat withdrawn.  He is unable to verbalize the reason he is here.  Pt is in no distress at this time and was given warm blanket because he was cold.  15 minute checks and video monitoring in place.

## 2018-09-25 NOTE — [facility] Suicide Risk Assessment (Cosign Needed)
Suicide Risk Assessment  Discharge Assessment   [facility] Discharge Suicide Risk Assessment   Principal Problem: Polysubstance abuse [facility]) Discharge Diagnoses: Principal Problem:   Polysubstance abuse (HCC)   Total Time spent with patient: 30 minutes  Musculoskeletal: Strength & Muscle Tone: within normal limits Gait & Station: normal Patient leans: N/A  Psychiatric Specialty Exam:   Blood pressure 115/85, pulse (!) 50, temperature 97.9 F (36.6 C), temperature source Oral, resp. rate 16, height 5\' 11"  (1.803 m), weight 61.2 kg, SpO2 99 %.Body mass index is 18.83 kg/m.  General Appearance: Casual  Eye Contact::  Fair  Speech:  Clear and Coherent and Slow409  Volume:  Decreased  Mood:  Depressed  Affect:  Congruent  Thought Process:  Coherent, Goal Directed and Descriptions of Associations: Intact  Orientation:  Full (Time, Place, and Person)  Thought Content:  Logical  Suicidal Thoughts:  No  Homicidal Thoughts:  No  Memory:  Immediate;   Good Recent;   Good Remote;   Fair  Judgement:  Impaired  Insight:  Lacking  Psychomotor Activity:  Normal  Concentration:  Good  Recall:  Good  Fund of Knowledge:Good  Language: Good  Akathisia:  No  Handed:  Right  AIMS (if indicated):     Assets:  Architect Housing Vocational/Educational  Sleep:     Cognition: WNL  ADL's:  Intact   Mental Status Per Nursing Assessment::   On Admission:   Pt was high on cocaine and presented to the [facility] after his family stated he was striking his head against the wall. Pt has had 3 visits to the emergency room in the past 6 months, he is well known to be a polysubstance abuser. Today his UDS is positive for cocaine and BAL is negative. Pt is denying suicidal and homicidal ideation. 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 Pt will be referred to [facility] for his mental health and medication needs. Pt will also be referred to [facility] for  substance abuse follow up in the community. Pt is psychiatrically clear for discharge.   Demographic Factors:  Male, Adolescent or young adult and Low socioeconomic status  Loss Factors: Financial problems/change in socioeconomic status  Historical Factors: Family history of mental illness or substance abuse  Risk Reduction Factors:   Sense of responsibility to family  Continued Clinical Symptoms:  Alcohol/Substance Abuse/Dependencies  Cognitive Features That Contribute To Risk:  Closed-mindedness    Suicide Risk:  Minimal: No identifiable suicidal ideation.  Patients presenting with no risk factors but with morbid ruminations; may be classified as minimal risk based on the severity of the depressive symptoms  Follow-up Information    Diamantina Providence, FNP.   Specialty:  Nurse Practitioner Contact information: [address] [place] [address] [phone]        Schedule an appointment as soon as possible for a visit  with [facility].   Specialty:  [facility] information: [address] [place] [address] [phone]           Plan Of Care/Follow-up recommendations:  Activity:  as tolerated Diet:  Heart healthy  Laveda Abbe, NP 09/25/2018, 12:51 PM

## 2018-09-25 NOTE — Discharge Instructions (Signed)
For your mental health needs, please follow up at:   [facility] [address] [place], [place] [address] [phone] Walk-in appointments available Mon-Fri from 8-5

## 2018-10-03 ENCOUNTER — Emergency Department (HOSPITAL_COMMUNITY)
Admission: EM | Admit: 2018-10-03 | Discharge: 2018-10-04 | Disposition: A | Discharge status: Other Acute Inpatient Hospital | Payer: Medicaid Other | Attending: Emergency Medicine | Admitting: Emergency Medicine

## 2018-10-03 ENCOUNTER — Other Ambulatory Visit: Payer: Self-pay

## 2018-10-03 ENCOUNTER — Encounter (HOSPITAL_COMMUNITY): Payer: Self-pay | Admitting: Emergency Medicine

## 2018-10-03 ENCOUNTER — Ambulatory Visit (HOSPITAL_COMMUNITY)
Admission: AD | Admit: 2018-10-03 | Discharge: 2018-10-03 | Disposition: A | Discharge status: Home / Self Care | Payer: Federal, State, Local not specified - Other | Attending: Psychiatry | Admitting: Psychiatry

## 2018-10-03 DIAGNOSIS — T483X2A Poisoning by antitussives, intentional self-harm, initial encounter: Secondary | ICD-10-CM | POA: Insufficient documentation

## 2018-10-03 DIAGNOSIS — F1910 Other psychoactive substance abuse, uncomplicated: Secondary | ICD-10-CM | POA: Diagnosis not present

## 2018-10-03 DIAGNOSIS — R45851 Suicidal ideations: Secondary | ICD-10-CM | POA: Insufficient documentation

## 2018-10-03 DIAGNOSIS — J45909 Unspecified asthma, uncomplicated: Secondary | ICD-10-CM | POA: Insufficient documentation

## 2018-10-03 DIAGNOSIS — Z79899 Other long term (current) drug therapy: Secondary | ICD-10-CM | POA: Insufficient documentation

## 2018-10-03 DIAGNOSIS — F329 Major depressive disorder, single episode, unspecified: Secondary | ICD-10-CM | POA: Insufficient documentation

## 2018-10-03 DIAGNOSIS — F17210 Nicotine dependence, cigarettes, uncomplicated: Secondary | ICD-10-CM | POA: Insufficient documentation

## 2018-10-03 DIAGNOSIS — T50902A Poisoning by unspecified drugs, medicaments and biological substances, intentional self-harm, initial encounter: Secondary | ICD-10-CM

## 2018-10-03 DIAGNOSIS — Z008 Encounter for other general examination: Secondary | ICD-10-CM | POA: Insufficient documentation

## 2018-10-03 DIAGNOSIS — Z1330 Encounter for screening examination for mental health and behavioral disorders, unspecified: Secondary | ICD-10-CM | POA: Diagnosis present

## 2018-10-03 DIAGNOSIS — F1410 Cocaine abuse, uncomplicated: Secondary | ICD-10-CM | POA: Insufficient documentation

## 2018-10-03 DIAGNOSIS — B20 Human immunodeficiency virus [HIV] disease: Secondary | ICD-10-CM | POA: Insufficient documentation

## 2018-10-03 LAB — COMPREHENSIVE METABOLIC PANEL
ALT: 56 U/L — ABNORMAL HIGH (ref 0–44)
AST: 48 U/L — ABNORMAL HIGH (ref 15–41)
Albumin: 4 g/dL (ref 3.5–5.0)
Alkaline Phosphatase: 76 U/L (ref 38–126)
Anion gap: 7 (ref 5–15)
BUN: 32 mg/dL — ABNORMAL HIGH (ref 6–20)
CO2: 25 mmol/L (ref 22–32)
Calcium: 8.8 mg/dL — ABNORMAL LOW (ref 8.9–10.3)
Chloride: 110 mmol/L (ref 98–111)
Creatinine, Ser: 1.1 mg/dL (ref 0.61–1.24)
GFR calc non Af Amer: >60 mL/min (ref >60)
Glucose, Bld: 93 mg/dL (ref 70–99)
Potassium: 4.4 mmol/L (ref 3.5–5.1)
SODIUM: 142 mmol/L (ref 135–145)
Total Bilirubin: 0.9 mg/dL (ref 0.3–1.2)
Total Protein: 7.6 g/dL (ref 6.5–8.1)

## 2018-10-03 LAB — CBC
HCT: 49.4 % (ref 39.0–52.0)
Hemoglobin: 15.8 g/dL (ref 13.0–17.0)
MCH: 28 pg (ref 26.0–34.0)
MCHC: 32 g/dL (ref 30.0–36.0)
MCV: 87.4 fL (ref 80.0–100.0)
PLATELETS: 298 10*3/uL (ref 150–400)
RBC: 5.65 MIL/uL (ref 4.22–5.81)
RDW: 13.4 % (ref 11.5–15.5)
WBC: 9.3 10*3/uL (ref 4.0–10.5)
nRBC: 0 % (ref 0.0–0.2)

## 2018-10-03 LAB — ACETAMINOPHEN LEVEL: Acetaminophen (Tylenol), Serum: <10 ug/mL — ABNORMAL LOW (ref 10–30)

## 2018-10-03 LAB — RAPID URINE DRUG SCREEN, HOSP PERFORMED
AMPHETAMINES: NOT DETECTED
Barbiturates: NOT DETECTED
Benzodiazepines: POSITIVE — AB
Cocaine: POSITIVE — AB
Opiates: NOT DETECTED
Tetrahydrocannabinol: POSITIVE — AB

## 2018-10-03 LAB — ETHANOL: Alcohol, Ethyl (B): <10 mg/dL (ref <10)

## 2018-10-03 LAB — SALICYLATE LEVEL: Salicylate Lvl: <7 mg/dL (ref 2.8–30.0)

## 2018-10-03 NOTE — ED Triage Notes (Signed)
Pt sent from [facility] for overdose on "20 some triple C;" pt verbalizes took it today but does not remember when. Pt verbalizes "you all are going to make this right, right? Am I okay?" Pt denies SI/HI.  Sent from [facility] for medical clearance; has a bed at [facility].

## 2018-10-03 NOTE — ED Provider Notes (Signed)
[facility]-EMERGENCY DEPT Provider Note   CSN: [id number] Arrival date & time: 10/03/18  1653     History   Chief Complaint Chief Complaint  Patient presents with  . Drug Overdose    HPI Pedro Owens is a 27 y.o. male.  HPI Patient presents after overdose in a suicide attempt and to get high.  States over the last 3 days he has been taking Triple C which is usually Coricidin cough medicine which is dextromethorphan.  Also has been smoking crack.  States it was an attempt to hurt himself and to get high.  History of HIV infection.  Denies other injury.  Seen at behavioral health and sent here for medical clearance but reportedly has a bed. Past Medical History:  Diagnosis Date  . ADHD (attention deficit hyperactivity disorder)   . Anal warts   . Anxiety   . Asthma   . Bipolar disorder (HCC)   . Chronic bronchitis (HCC)   . Chronic pain syndrome   . Convulsions (HCC) 08/14/2015  . Depression   . Gonorrhea 09/08/11  . Headache    "weekly" (11/06/2016)  . Hemorrhoids, internal, with bleeding 08/25/2011  . Hepatitis C   . Herpes   . HIV infection (HCC)   . Hypertension   . ILEITIS 09/24/2010   Qualifier: Diagnosis of  By: Nelson-Smith CMA (AAMA), Dottie    . Lymphoid hyperplasia, reactive   . Marijuana abuse   . Migraine    "monthly" (11/06/2016)  . Pseudoseizures   . Seizures (HCC)    "don't know why I have them" (11/06/2016)  . Syphilis     Patient Active Problem List   Diagnosis Date Noted  . Substance induced mood disorder (HCC) 04/03/2018  . Pressure injury of skin 11/26/2017  . SOB (shortness of breath)   . Septic embolism (HCC)   . Transgender   . Pleural effusion, bacterial 11/23/2017  . Loculated pleural effusion 11/23/2017  . Hepatitis C antibody test positive 11/19/2017  . Septic pulmonary embolism (HCC) 11/19/2017  . Acute endocarditis   . Pleuritic chest pain   . IVDU (intravenous drug user)   . Endocarditis, suspected  11/18/2017  . SIRS (systemic inflammatory response syndrome) (HCC) 11/15/2017  . Injury, superficial, elbow, forearm, or wrist with infection, right, initial encounter 11/12/2016  . Abscess of right forearm 11/04/2016  . HIV disease (HCC) 04/07/2016  . Cigarette smoker 04/07/2016  . Polysubstance abuse (HCC) 04/07/2016  . Depression 08/14/2015  . Conversion disorder 08/14/2015  . Hemorrhoids, internal, with bleeding 08/25/2011  . Anal warts 08/25/2011  . ANXIETY 09/24/2010  . ADHD 09/24/2010    Past Surgical History:  Procedure Laterality Date  . ANKLE SURGERY Bilateral 2005   Screw & Pins  . ANKLE SURGERY Right 2008   "replaced pins & screws"  . FOOT SURGERY Bilateral 2005   "bone spurs"  . I&D EXTREMITY Right 06/26/2016   Procedure: IRRIGATION AND DEBRIDEMENT RIGHT FOREARM;  Surgeon: Betha Loa, MD;  Location: [facility] OR;  Service: Orthopedics;  Laterality: Right;  . I&D EXTREMITY Right 11/04/2016   Procedure: IRRIGATION AND DEBRIDEMENT EXTREMITY;  Surgeon: Dairl Ponder, MD;  Location: [facility] OR;  Service: Orthopedics;  Laterality: Right;  . I&D EXTREMITY Right 11/06/2016   Procedure: IRRIGATION AND DEBRIDEMENT right forarm;  Surgeon: Dairl Ponder, MD;  Location: [facility] OR;  Service: Orthopedics;  Laterality: Right;  . TEE WITHOUT CARDIOVERSION N/A 11/19/2017   Procedure: TRANSESOPHAGEAL ECHOCARDIOGRAM (TEE);  Surgeon: Pricilla Riffle, MD;  Location:  [facility] ENDOSCOPY;  Service: Cardiovascular;  Laterality: N/A;  . VIDEO ASSISTED THORACOSCOPY (VATS)/EMPYEMA Left 11/26/2017   Procedure: VIDEO ASSISTED THORACOSCOPY (VATS)/DRAIN EMPYEMA;  Surgeon: Kerin Perna, MD;  Location: [facility] OR;  Service: Thoracic;  Laterality: Left;        Home Medications    Prior to Admission medications   Medication Sig Start Date End Date Taking? Authorizing Provider  abacavir-dolutegravir-lamiVUDine (TRIUMEQ) 600-50-300 MG tablet Take 1 tablet by mouth daily. 07/11/18  Yes Kuppelweiser, Cassie L, RPH-CPP    ARIPiprazole (ABILIFY) 2 MG tablet Take 1 tablet (2 mg total) by mouth daily. Patient not taking: Reported on 10/03/2018 04/07/18   Denzil Magnuson, NP  FLUoxetine (PROZAC) 20 MG capsule Take 1 capsule (20 mg total) by mouth daily. Patient not taking: Reported on 10/03/2018 04/07/18   Denzil Magnuson, NP  traZODone (DESYREL) 100 MG tablet Take 1 tablet (100 mg total) by mouth at bedtime as needed for sleep. Patient not taking: Reported on 10/03/2018 04/06/18   Denzil Magnuson, NP  divalproex (DEPAKOTE) 500 MG DR tablet Take 1 tablet (500 mg total) by mouth 2 (two) times daily. 02/28/16 02/28/16  Lindalou Hose, MD    Family History Family History  Problem Relation Age of Onset  . Diabetes Mother   . Irritable bowel syndrome Mother   . Hypertension Mother   . Hyperlipidemia Mother   . Hypothyroidism Mother   . Diabetes Maternal Uncle   . Heart disease Maternal Grandmother   . Leukemia Other        maternal greatgrandmother  . Colon cancer Neg Hx     Social History Social History   Tobacco Use  . Smoking status: Current Every Day Smoker    Packs/day: 1.50    Years: 11.00    Pack years: 16.50    Types: Cigarettes  Substance Use Topics  . Alcohol use: Yes    Comment: 11/06/2016 "nothing in a very long time"  . Drug use: Yes    Frequency: 2.0 times per week    Types: Cocaine, Benzodiazepines, Oxycodone, Marijuana, Heroin    Comment: yesterday     Allergies   Acetaminophen; Other; Vicodin [hydrocodone-acetaminophen]; and Dilaudid [hydromorphone hcl]   Review of Systems Review of Systems  Constitutional: Negative for appetite change.  HENT: Negative for congestion.   Respiratory: Negative for shortness of breath.   Gastrointestinal: Negative for abdominal pain.  Genitourinary: Negative for flank pain.  Musculoskeletal: Negative for back pain.  Skin: Negative for rash.  Hematological: Negative for adenopathy.  Psychiatric/Behavioral: Positive for dysphoric mood and  suicidal ideas. Negative for confusion.     Physical Exam Updated Vital Signs BP (!) 140/96   Pulse 79   Temp 98.3 F (36.8 C) (Oral)   Resp 20   Ht 5' 11.5" (1.816 m)   Wt 64 kg   SpO2 96%   BMI 19.39 kg/m   Physical Exam HENT:     Head: Atraumatic.     Mouth/Throat:     Mouth: Mucous membranes are moist.  Eyes:     Extraocular Movements: Extraocular movements intact.  Cardiovascular:     Rate and Rhythm: Normal rate and regular rhythm.  Pulmonary:     Effort: Pulmonary effort is normal.  Musculoskeletal:        General: No swelling or deformity.  Skin:    General: Skin is warm.     Capillary Refill: Capillary refill takes less than 2 seconds.  Neurological:     General: No focal deficit present.  Mental Status: He is alert.  Psychiatric:        Mood and Affect: Mood normal.      ED Treatments / Results  Labs (all labs ordered are listed, but only abnormal results are displayed) Labs Reviewed  COMPREHENSIVE METABOLIC PANEL - Abnormal; Notable for the following components:      Result Value   BUN 32 (*)    Calcium 8.8 (*)    AST 48 (*)    ALT 56 (*)    All other components within normal limits  ACETAMINOPHEN LEVEL - Abnormal; Notable for the following components:   Acetaminophen (Tylenol), Serum <10 (*)    All other components within normal limits  RAPID URINE DRUG SCREEN, HOSP PERFORMED - Abnormal; Notable for the following components:   Cocaine POSITIVE (*)    Benzodiazepines POSITIVE (*)    Tetrahydrocannabinol POSITIVE (*)    All other components within normal limits  ETHANOL  SALICYLATE LEVEL  CBC    EKG EKG Interpretation  Date/Time:  Monday October 03 2018 17:10:49 EST Ventricular Rate:  87 PR Interval:    QRS Duration: 92 QT Interval:  383 QTC Calculation: 461 R Axis:   76 Text Interpretation:  Sinus rhythm Biatrial enlargement Probable left ventricular hypertrophy Anterolateral Q wave, probably normal for age Confirmed by  Benjiman Core [phone]) on 10/03/2018 5:30:51 PM   Radiology No results found.  Procedures Procedures (including critical care time)  Medications Ordered in ED Medications - No data to display   Initial Impression / Assessment and Plan / ED Course  I have reviewed the triage vital signs and the nursing notes.  Pertinent labs & imaging results that were available during my care of the patient were reviewed by me and considered in my medical decision making (see chart for details).     Patient presents after cocaine abuse and overdose.  Reportedly took triple C.  Will require monitoring till at least 1:30 in the morning.  Otherwise lab work reassuring and patient is medically cleared.  Reportedly was brought to get high and potentially a suicide attempt.  Will require psychiatric evaluation after medically cleared.  Final Clinical Impressions(s) / ED Diagnoses   Final diagnoses:  Intentional drug overdose, initial encounter [facility])  Cocaine abuse [facility])    ED Discharge Orders    None       Benjiman Core, MD 10/03/18 2031

## 2018-10-03 NOTE — ED Notes (Addendum)
LOOK FOR: Tachycardia, hypertension, QRS/QTC prolongation, n/v, agitation, delirium, slower bowel sounds, changes in eyes.  If any of the above symptoms call back poison control.   AVOID Haldol and Geodon/GIVE ativan for agitation.   CALL THE ABOVE FROM [facility] POISON CONTR[facility].   Observation to 0130...longer if not to baseline.  Pickering MD aware of all the above and acknowledged recommendations per poison control.

## 2018-10-03 NOTE — H&P (Signed)
Behavioral Health Medical Screening Exam  Pedro Owens is an 27 y.o. male who presents as a walk in due to severe depression and issues with substance abuse. Patient states "I was up at 3 am smoking crack. Then I took eighteen tablets of cold medicine." When the patient was asked if taking the cold medicine was a suicide attempt he began to cry. Patient is unable to contract for his safety. He will need medical clearance due to unknown substance use prior to treatment. His urine drug screen was positive for cocaine during admission to [facility] 09/25/2018.   Total Time spent with patient: 15 minutes  Psychiatric Specialty Exam: Physical Exam  Constitutional: He is oriented to person, place, and time. He appears well-developed and well-nourished.  HENT:  Head: Normocephalic.  Neck: Normal range of motion.  Cardiovascular: Normal rate, regular rhythm, normal heart sounds and intact distal pulses.  Respiratory: Effort normal and breath sounds normal.  GI: Soft. Bowel sounds are normal.  Musculoskeletal: Normal range of motion.  Neurological: He is alert and oriented to person, place, and time.  Skin: Skin is warm.    ROS  Blood pressure (!) 157/105, pulse 100, temperature 97.9 F (36.6 C), resp. rate 18, SpO2 100 %.There is no height or weight on file to calculate BMI.  General Appearance: Disheveled  Eye Contact:  Fair  Speech:  Clear and Coherent  Volume:  Decreased  Mood:  Dysphoric  Affect:  Labile  Thought Process:  Coherent  Orientation:  Full (Time, Place, and Person)  Thought Content:  WDL  Suicidal Thoughts:  Yes.  without intent/plan  Homicidal Thoughts:  No  Memory:  Immediate;   Fair Recent;   Fair Remote;   Fair  Judgement:  Poor  Insight:  Shallow  Psychomotor Activity:  Normal  Concentration: Concentration: Fair and Attention Span: Fair  Recall:  Poor  Fund of Knowledge:Fair  Language: Good  Akathisia:  No  Handed:  Right  AIMS (if indicated):     Assets:   Communication Skills Desire for Improvement Leisure Time Resilience  Sleep:       Musculoskeletal: Strength & Muscle Tone: within normal limits Gait & Station: normal Patient leans: N/A  Blood pressure (!) 157/105, pulse 100, temperature 97.9 F (36.6 C), resp. rate 18, SpO2 100 %.  Recommendations:  Based on my evaluation the patient does not appear to have an emergency medical condition.  Fransisca Kaufmann, NP 10/03/2018, 4:22 PM
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
## 2018-10-03 NOTE — ED Notes (Signed)
Spoke to poison control, states to continue observation until back to baseline.

## 2018-10-03 NOTE — ED Notes (Signed)
Pt has been seen and wand by security.  Pt has a belongings bag and 1 piece of red luggage.  Two bottle of meds put in [place] bag.

## 2018-10-04 ENCOUNTER — Other Ambulatory Visit: Payer: Self-pay

## 2018-10-04 ENCOUNTER — Inpatient Hospital Stay (HOSPITAL_COMMUNITY)
Admission: AD | Admit: 2018-10-04 | Discharge: 2018-10-11 | DRG: 885 | Disposition: A | Discharge status: Home / Self Care | Payer: Federal, State, Local not specified - Other | Source: Intra-hospital | Attending: Psychiatry | Admitting: Psychiatry

## 2018-10-04 ENCOUNTER — Encounter (HOSPITAL_COMMUNITY): Payer: Self-pay

## 2018-10-04 DIAGNOSIS — Z885 Allergy status to narcotic agent status: Secondary | ICD-10-CM | POA: Diagnosis not present

## 2018-10-04 DIAGNOSIS — F609 Personality disorder, unspecified: Secondary | ICD-10-CM | POA: Diagnosis present

## 2018-10-04 DIAGNOSIS — J029 Acute pharyngitis, unspecified: Secondary | ICD-10-CM | POA: Diagnosis not present

## 2018-10-04 DIAGNOSIS — Z560 Unemployment, unspecified: Secondary | ICD-10-CM

## 2018-10-04 DIAGNOSIS — F1210 Cannabis abuse, uncomplicated: Secondary | ICD-10-CM | POA: Diagnosis not present

## 2018-10-04 DIAGNOSIS — R0981 Nasal congestion: Secondary | ICD-10-CM | POA: Diagnosis not present

## 2018-10-04 DIAGNOSIS — Z806 Family history of leukemia: Secondary | ICD-10-CM

## 2018-10-04 DIAGNOSIS — F1410 Cocaine abuse, uncomplicated: Secondary | ICD-10-CM | POA: Diagnosis present

## 2018-10-04 DIAGNOSIS — B20 Human immunodeficiency virus [HIV] disease: Secondary | ICD-10-CM | POA: Diagnosis present

## 2018-10-04 DIAGNOSIS — F333 Major depressive disorder, recurrent, severe with psychotic symptoms: Principal | ICD-10-CM | POA: Diagnosis present

## 2018-10-04 DIAGNOSIS — F1910 Other psychoactive substance abuse, uncomplicated: Secondary | ICD-10-CM | POA: Diagnosis present

## 2018-10-04 DIAGNOSIS — Z9114 Patient's other noncompliance with medication regimen: Secondary | ICD-10-CM | POA: Diagnosis not present

## 2018-10-04 DIAGNOSIS — Z8349 Family history of other endocrine, nutritional and metabolic diseases: Secondary | ICD-10-CM

## 2018-10-04 DIAGNOSIS — R42 Dizziness and giddiness: Secondary | ICD-10-CM | POA: Diagnosis present

## 2018-10-04 DIAGNOSIS — F419 Anxiety disorder, unspecified: Secondary | ICD-10-CM | POA: Diagnosis not present

## 2018-10-04 DIAGNOSIS — Z8249 Family history of ischemic heart disease and other diseases of the circulatory system: Secondary | ICD-10-CM

## 2018-10-04 DIAGNOSIS — Z915 Personal history of self-harm: Secondary | ICD-10-CM | POA: Diagnosis not present

## 2018-10-04 DIAGNOSIS — Z79899 Other long term (current) drug therapy: Secondary | ICD-10-CM | POA: Diagnosis not present

## 2018-10-04 DIAGNOSIS — R45851 Suicidal ideations: Secondary | ICD-10-CM | POA: Diagnosis present

## 2018-10-04 DIAGNOSIS — Z886 Allergy status to analgesic agent status: Secondary | ICD-10-CM

## 2018-10-04 DIAGNOSIS — F17210 Nicotine dependence, cigarettes, uncomplicated: Secondary | ICD-10-CM | POA: Diagnosis present

## 2018-10-04 DIAGNOSIS — Z833 Family history of diabetes mellitus: Secondary | ICD-10-CM

## 2018-10-04 DIAGNOSIS — F0280 Dementia in other diseases classified elsewhere without behavioral disturbance: Secondary | ICD-10-CM | POA: Diagnosis present

## 2018-10-04 MED ORDER — NICOTINE 21 MG/24HR TD PT24
21.0000 mg | MEDICATED_PATCH | Freq: Every day | TRANSDERMAL | Status: DC
  Administered 2018-10-04 – 2018-10-11 (×8): 21 mg via TRANSDERMAL
  Filled 2018-10-04 (×9): qty 1

## 2018-10-04 MED ORDER — ALUM & MAG HYDROXIDE-SIMETH 200-200-20 MG/5ML PO SUSP
30.0000 mL | ORAL | Status: DC | PRN
  Administered 2018-10-10: 30 mL via ORAL
  Filled 2018-10-04: qty 30

## 2018-10-04 MED ORDER — QUETIAPINE FUMARATE 50 MG PO TABS
50.0000 mg | ORAL_TABLET | Freq: Three times a day (TID) | ORAL | Status: DC
  Administered 2018-10-04 – 2018-10-05 (×4): 50 mg via ORAL
  Filled 2018-10-04 (×10): qty 1

## 2018-10-04 MED ORDER — MAGNESIUM HYDROXIDE 400 MG/5ML PO SUSP: 30.0000 mL | Freq: Every day | ORAL | Status: DC | PRN

## 2018-10-04 MED ORDER — TEMAZEPAM 15 MG PO CAPS
30.0000 mg | ORAL_CAPSULE | Freq: Every day | ORAL | Status: DC
  Administered 2018-10-04 – 2018-10-07 (×3): 30 mg via ORAL
  Filled 2018-10-04 (×4): qty 2

## 2018-10-04 MED ORDER — ABACAVIR-DOLUTEGRAVIR-LAMIVUD 600-50-300 MG PO TABS
1.0000 | ORAL_TABLET | Freq: Every day | ORAL | Status: DC
  Administered 2018-10-04 – 2018-10-11 (×8): 1 via ORAL
  Filled 2018-10-04 (×9): qty 1

## 2018-10-04 MED ORDER — HYDROXYZINE HCL 25 MG PO TABS: 25.0000 mg | ORAL_TABLET | Freq: Three times a day (TID) | ORAL | Status: DC | PRN

## 2018-10-04 MED ORDER — CLONIDINE HCL 0.1 MG PO TABS
0.1000 mg | ORAL_TABLET | Freq: Once | ORAL | Status: AC
  Administered 2018-10-04: 0.1 mg via ORAL
  Filled 2018-10-04: qty 1

## 2018-10-04 MED ORDER — ENSURE ENLIVE PO LIQD
237.0000 mL | Freq: Two times a day (BID) | ORAL | Status: DC
  Administered 2018-10-04 – 2018-10-11 (×12): 237 mL via ORAL

## 2018-10-04 NOTE — Tx Team (Signed)
Initial Treatment Plan 10/04/2018 5:35 AM Pedro Owens MGQ:[id number]    PATIENT STRESSORS: Health problems Medication change or noncompliance Substance abuse   PATIENT STRENGTHS: Capable of independent living Communication skills Motivation for treatment/growth Supportive family/friends   PATIENT IDENTIFIED PROBLEMS: Suicidal ideation/attempt  Substance abuse  "Start over"  "Do right"               DISCHARGE CRITERIA:  Improved stabilization in mood, thinking, and/or behavior Motivation to continue treatment in a less acute level of care Need for constant or close observation no longer present Verbal commitment to aftercare and medication compliance  PRELIMINARY DISCHARGE PLAN: Outpatient therapy Medication management  PATIENT/FAMILY INVOLVEMENT: This treatment plan has been presented to and reviewed with the patient, [name].  The patient and family have been given the opportunity to ask questions and make suggestions.  Levin Bacon, RN 10/04/2018, 5:35 AM

## 2018-10-04 NOTE — Plan of Care (Signed)
Progress note  D: pt found in bed; compliant with medication administration. Pt feels confused and disoriented. Pt states he slept poorly. Pt denies any si/hi/ah/vh and verbally agrees to approach staff if these become apparent or before harming himself while at [facility]. Pt denies any physical pain, rating this a 0/10. Pt is still unsteady in his gait.  A: pt provided support and encouragement. Pt given medication per protocol and standing orders. Q63m safety checks implemented and continued.  R: pt safe on the unit. Will continue to monitor.   Pt progressing in the following metrics  Problem: Education: Goal: Knowledge of disease or condition will improve Outcome: Progressing Goal: Understanding of discharge needs will improve Outcome: Progressing   Problem: Health Behavior/Discharge Planning: Goal: Ability to identify changes in lifestyle to reduce recurrence of condition will improve Outcome: Progressing Goal: Identification of resources available to assist in meeting health care needs will improve Outcome: Progressing
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
## 2018-10-04 NOTE — BH Assessment (Signed)
BHH Assessment Progress Note   Clinician obtained signature on voluntary admission forms.  Forms faxed to [facility].  Patient to be transported via [place].

## 2018-10-04 NOTE — H&P (Signed)
Psychiatric Admission Assessment Adult  Patient Identification: Pedro Owens MRN:  [id number] Date of Evaluation:  10/04/2018 Chief Complaint:  cocaine use disorder  Mdd Principal Diagnosis: Psychosis in the context of polysubstance abuse/intoxication, and reports of depression and suicidal thoughts and plans- Also reporting HIV positive status Diagnosis:  Active Problems:   Severe recurrent major depression w/psychotic features, mood-congruent (HCC)  History of Present Illness:  Pedro Owens is a 27 year old individual who presented voluntarily complaining of depression, binging on cocaine which is a drug dependency for him, as well as binging on a cold medication described as "triple C" which contains dextromethorphan Stated he did overdose in an attempt to get high but also stated it was an attempt to harm himself.  When I speak with him today he is giving minimal answers and is not fully oriented.  Further chart review reveals he was here in June of this year at that point he again acknowledged suicidality even cutting his left arm although he denied it when he presented though this was the grounds for his petition for involuntary commitment.  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 At that point in time drug screen showed cannabis and cocaine.  He acknowledged HIV positive status about 2-1/2 years.  Past treatment with Prozac for depression  Again during this evaluation he is presenting with more confusional symptoms.  He is alert and is oriented to person and general situation and place and can name that he is in [place] but is not sure of the month states it first its March and then states "Christmas, December" Not sure of the date knows its morning time and knows it is Tuesday. Does not attempt serial sevens or repetition of  objects Associated Signs/Symptoms: Depression Symptoms:  suicidal thoughts with specific plan, suicidal attempt, (Hypo) Manic Symptoms:  Distractibility, Anxiety Symptoms:  Specific Phobias, denied Psychotic Symptoms:  Thought disorder/probably substance-induced PTSD Symptoms: NA Total Time spent with patient: 45 minutes  Past Psychiatric History: As above  Is the patient at risk to self? Yes.    Has the patient been a risk to self in the past 6 months? Yes.    Has the patient been a risk to self within the distant past? No.  Is the patient a risk to others? No.  Has the patient been a risk to others in the past 6 months? No.  Has the patient been a risk to others within the distant past? No.   Alcohol Screening: 1. How often do you have a drink containing alcohol?: Never 2. How many drinks containing alcohol do you have on a typical day when you are drinking?: 1 or 2 3. How often do you have six or more drinks on one occasion?: Never AUDIT-C Score: 0 9. Have you or someone else been injured as a result of your drinking?: No 10. Has a relative or friend or a doctor or another health worker been concerned about your drinking or suggested you cut down?: No Alcohol Use Disorder Identification Test Final Score (AUDIT): 0 Intervention/Follow-up: AUDIT Score <7 follow-up not indicated Substance Abuse History in the last 12 months:  Yes.   Consequences of Substance Abuse: Medical Consequences:  Of course needing detox, and of course family disruption Family Consequences:  Family disruption Previous Psychotropic Medications: Yes  Psychological Evaluations: No  Past Medical History:  Past Medical History:  Diagnosis Date  . ADHD (attention deficit hyperactivity disorder)   . Anal warts   . Anxiety   . Asthma   . Bipolar disorder (HCC)   . Chronic bronchitis (HCC)   . Chronic pain syndrome   . Convulsions (HCC) 08/14/2015  . Depression   . Gonorrhea 09/08/11  . Headache     "weekly" (11/06/2016)  . Hemorrhoids, internal, with bleeding 08/25/2011  . Hepatitis C   . Herpes   . HIV infection (HCC)   . Hypertension   . ILEITIS 09/24/2010   Qualifier: Diagnosis of  By: Nelson-Smith CMA (AAMA), Dottie    . Lymphoid hyperplasia, reactive   . Marijuana abuse   . Migraine    "monthly" (11/06/2016)  . Pseudoseizures   . Seizures (HCC)    "don't know why I have them" (11/06/2016)  . Syphilis     Past Surgical History:  Procedure Laterality Date  . ANKLE SURGERY Bilateral 2005   Screw & Pins  . ANKLE SURGERY Right 2008   "replaced pins & screws"  . FOOT SURGERY Bilateral 2005   "bone spurs"  . I&D EXTREMITY Right 06/26/2016   Procedure: IRRIGATION AND DEBRIDEMENT RIGHT FOREARM;  Surgeon: Betha Loa, MD;  Location: [facility] OR;  Service: Orthopedics;  Laterality: Right;  . I&D EXTREMITY Right 11/04/2016   Procedure: IRRIGATION AND DEBRIDEMENT EXTREMITY;  Surgeon: Dairl Ponder, MD;  Location: [facility] OR;  Service: Orthopedics;  Laterality: Right;  . I&D EXTREMITY Right 11/06/2016   Procedure: IRRIGATION AND DEBRIDEMENT right forarm;  Surgeon: Dairl Ponder, MD;  Location: [facility] OR;  Service: Orthopedics;  Laterality: Right;  . TEE WITHOUT CARDIOVERSION N/A 11/19/2017   Procedure: TRANSESOPHAGEAL ECHOCARDIOGRAM (TEE);  Surgeon: Pricilla Riffle, MD;  Location: [facility] ENDOSCOPY;  Service: Cardiovascular;  Laterality: N/A;  . VIDEO ASSISTED THORACOSCOPY (VATS)/EMPYEMA Left 11/26/2017   Procedure: VIDEO ASSISTED THORACOSCOPY (VATS)/DRAIN EMPYEMA;  Surgeon: Kerin Perna, MD;  Location: [facility] OR;  Service: Thoracic;  Laterality: Left;   Family History:  Family History  Problem Relation Age of Onset  . Diabetes Mother   . Irritable bowel syndrome Mother   . Hypertension Mother   . Hyperlipidemia Mother   . Hypothyroidism Mother   . Diabetes Maternal Uncle   . Heart disease Maternal Grandmother   . Leukemia Other        maternal greatgrandmother  . Colon cancer Neg Hx    Family  Psychiatric  History: Unable to articulate Tobacco Screening: Have you used any form of tobacco in the last 30 days? (Cigarettes, Smokeless Tobacco, Cigars, and/or Pipes): Yes Tobacco use, Select all that apply: 5 or more cigarettes per day Are you interested in Tobacco Cessation Medications?: Yes, will notify MD for an order Counseled patient on smoking cessation including recognizing danger situations, developing coping skills and basic information about quitting provided: Refused/Declined practical counseling Social History:  Social History   Substance and Sexual Activity  Alcohol Use Yes   Comment: 11/06/2016 "nothing in a very long time"     Social History   Substance and Sexual Activity  Drug Use Yes  . Frequency: 2.0 times per week  . Types: Cocaine, Benzodiazepines, Oxycodone, Marijuana, Heroin   Comment: yesterday        Allergies:   Allergies  Allergen Reactions  . Acetaminophen Diarrhea and Nausea And Vomiting    Flares up crohns disease Flares up crohns disease Flares up crohns disease  . Other Other (See Comments)  IV CONTRAST IV CONTRAST  . Vicodin [Hydrocodone-Acetaminophen] Nausea And Vomiting  . Dilaudid [Hydromorphone Hcl] Hives and Rash   Lab Results:  Results for orders placed or performed during the hospital encounter of 10/03/18 (from the past 48 hour(s))  Comprehensive metabolic panel     Status: Abnormal   Collection Time: 10/03/18  5:43 PM  Result Value Ref Range   Sodium 142 135 - 145 mmol/L   Potassium 4.4 3.5 - 5.1 mmol/L   Chloride 110 98 - 111 mmol/L   CO2 25 22 - 32 mmol/L   Glucose, Bld 93 70 - 99 mg/dL   BUN 32 (H) 6 - 20 mg/dL   Creatinine, Ser 3.76 0.61 - 1.24 mg/dL   Calcium 8.8 (L) 8.9 - 10.3 mg/dL   Total Protein 7.6 6.5 - 8.1 g/dL   Albumin 4.0 3.5 - 5.0 g/dL   AST 48 (H) 15 - 41 U/L   ALT 56 (H) 0 - 44 U/L   Alkaline Phosphatase 76 38 - 126 U/L   Total Bilirubin 0.9 0.3 - 1.2 mg/dL   GFR calc non Af Amer >60 >60 mL/min    GFR calc Af Amer >60 >60 mL/min   Anion gap 7 5 - 15    Comment: Performed at [facility], [address] [place], [place] [address]  Ethanol     Status: None   Collection Time: 10/03/18  5:43 PM  Result Value Ref Range   Alcohol, Ethyl (B) <10 <10 mg/dL    Comment: (NOTE) Lowest detectable limit for serum alcohol is 10 mg/dL. For medical purposes only. Performed at [facility], [address], [place], [place] [address]   Salicylate level     Status: None   Collection Time: 10/03/18  5:43 PM  Result Value Ref Range   Salicylate Lvl <7.0 2.8 - 30.0 mg/dL    Comment: Performed at [facility], [address], [place], [place] [address]  Acetaminophen level     Status: Abnormal   Collection Time: 10/03/18  5:43 PM  Result Value Ref Range   Acetaminophen (Tylenol), Serum <10 (L) 10 - 30 ug/mL    Comment: (NOTE) Therapeutic concentrations vary significantly. A range of 10-30 ug/mL  may be an effective concentration for many patients. However, some  are best treated at concentrations outside of this range. Acetaminophen concentrations >150 ug/mL at 4 hours after ingestion  and >50 ug/mL at 12 hours after ingestion are often associated with  toxic reactions. Performed at [facility], [address], [place], [place] [address]   cbc     Status: None   Collection Time: 10/03/18  5:43 PM  Result Value Ref Range   WBC 9.3 4.0 - 10.5 K/uL   RBC 5.65 4.22 - 5.81 MIL/uL   Hemoglobin 15.8 13.0 - 17.0 g/dL   HCT 48.5 46.2 - 70.3 %   MCV 87.4 80.0 - 100.0 fL   MCH 28.0 26.0 - 34.0 pg   MCHC 32.0 30.0 - 36.0 g/dL   RDW 50.0 93.8 - 18.2 %   Platelets 298 150 - 400 K/uL   nRBC 0.0 0.0 - 0.2 %    Comment: Performed at [facility], [address], [place], [place] [address]  Rapid urine drug screen (hospital performed)     Status: Abnormal   Collection Time: 10/03/18  6:17 PM  Result Value  Ref Range   Opiates NONE DETECTED NONE DETECTED  Cocaine POSITIVE (A) NONE DETECTED   Benzodiazepines POSITIVE (A) NONE DETECTED   Amphetamines NONE DETECTED NONE DETECTED   Tetrahydrocannabinol POSITIVE (A) NONE DETECTED   Barbiturates NONE DETECTED NONE DETECTED    Comment: (NOTE) DRUG SCREEN FOR MEDICAL PURPOSES ONLY.  IF CONFIRMATION IS NEEDED FOR ANY PURPOSE, NOTIFY LAB WITHIN 5 DAYS. LOWEST DETECTABLE LIMITS FOR URINE DRUG SCREEN Drug Class                     Cutoff (ng/mL) Amphetamine and metabolites    1000 Barbiturate and metabolites    200 Benzodiazepine                 200 Tricyclics and metabolites     300 Opiates and metabolites        300 Cocaine and metabolites        300 THC                            50 Performed at [facility], [address] [place], [place] [address]     Blood Alcohol level:  Lab Results  Component Value Date   ETH <10 10/03/2018   ETH <10 09/24/2018    Metabolic Disorder Labs:  Lab Results  Component Value Date   HGBA1C 5.8 (H) 11/25/2017   MPG 119.76 11/25/2017   No results found for: PROLACTIN Lab Results  Component Value Date   CHOL 97 01/06/2017   TRIG 114 01/06/2017   HDL 39 (L) 01/06/2017   CHOLHDL 2.5 01/06/2017   VLDL 23 01/06/2017   LDLCALC 35 01/06/2017   LDLCALC 43 03/26/2016    Current Medications: Current Facility-Administered Medications  Medication Dose Route Frequency Provider Last Rate Last Dose  . abacavir-dolutegravir-lamiVUDine (TRIUMEQ) 600-50-300 MG per tablet 1 tablet  1 tablet Oral Daily Nira Conn A, NP   1 tablet at 10/04/18 0801  . alum & mag hydroxide-simeth (MAALOX/MYLANTA) 200-200-20 MG/5ML suspension 30 mL  30 mL Oral Q4H PRN Nira Conn A, NP      . hydrOXYzine (ATARAX/VISTARIL) tablet 25 mg  25 mg Oral TID PRN Nira Conn A, NP      . magnesium hydroxide (MILK OF MAGNESIA) suspension 30 mL  30 mL Oral Daily PRN Nira Conn A, NP      . nicotine (NICODERM CQ -  dosed in mg/24 hours) patch 21 mg  21 mg Transdermal Daily Nira Conn A, NP   21 mg at 10/04/18 0801  . QUEtiapine (SEROQUEL) tablet 50 mg  50 mg Oral TID Malvin Johns, MD      . temazepam (RESTORIL) capsule 30 mg  30 mg Oral QHS Malvin Johns, MD       PTA Medications: Medications Prior to Admission  Medication Sig Dispense Refill Last Dose  . abacavir-dolutegravir-lamiVUDine (TRIUMEQ) 600-50-300 MG tablet Take 1 tablet by mouth daily. 30 tablet 5 10/03/2018 at Unknown time  . ARIPiprazole (ABILIFY) 2 MG tablet Take 1 tablet (2 mg total) by mouth daily. (Patient not taking: Reported on 10/03/2018) 30 tablet 0 Not Taking at Unknown time  . FLUoxetine (PROZAC) 20 MG capsule Take 1 capsule (20 mg total) by mouth daily. (Patient not taking: Reported on 10/03/2018) 30 capsule 0 Not Taking at Unknown time  . traZODone (DESYREL) 100 MG tablet Take 1 tablet (100 mg total) by mouth at bedtime as needed for sleep. (Patient not taking: Reported on 10/03/2018) 30 tablet 0 Not Taking at  Unknown time    Musculoskeletal: Strength & Muscle Tone: within normal limits Gait & Station: normal Patient leans: N/A  Psychiatric Specialty Exam: Physical Exam  ROS  Blood pressure (!) 153/110, pulse 95, temperature 98 F (36.7 C), temperature source Oral, resp. rate 18, height 5' 11.5" (1.816 m), weight 64 kg.Body mass index is 19.39 kg/m.  General Appearance: Casual and Disheveled  Eye Contact:  Fair  Speech:  Blocked  Volume:  Decreased  Mood:  Dysphoric  Affect:  Restricted  Thought Process:  Disorganized  Orientation:  Other:  Person place general situation and month  Thought Content:  Illogical  Suicidal Thoughts:  Yes.  without intent/plan  Homicidal Thoughts:  No  Memory:  Immediate;   Poor  Judgement:  Impaired  Insight:  Shallow  Psychomotor Activity:  Decreased  Concentration:  Concentration: Poor  Recall:  Poor  Fund of Knowledge:  Poor  Language:  Fair  Akathisia:  Negative  Handed:   Right  AIMS (if indicated):     Assets:  Communication Skills  ADL's:  Intact  Cognition:  WNL  Sleep:  Number of Hours: 0.75    Treatment Plan Summary: Daily contact with patient to assess and evaluate symptoms and progress in treatment, Medication management and Plan For substance abuse/polysubstance dependency monitor for withdrawal/for depressive symptoms treat with antidepressant/for HIV continue HIV meds and screen for HIV encephalopathy versus drug-induced psychosis  Observation Level/Precautions:  15 minute checks  Laboratory:  UDS  Psychotherapy: Cognitive and reality and rehab based  Medications: Detox meds antidepressants HIV meds  Consultations: Possible  Discharge Concerns: Long-term sobriety  Estimated LOS: 5-7  Other: See orders   Physician Treatment Plan for Primary Diagnosis:   In summary, 27 year old HIV-positive patient has been binging on cocaine and cold medication in order to get high and also to harm himself presents with a formal thought disorder and disorganization of thought.  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 in combination with poor treatment compliance with HIV meds In need of inpatient stabilization  Long Term Goal(s): Improvement in symptoms so as ready for discharge  Short Term Goals: Ability to verbalize feelings will improve, Ability to disclose and discuss suicidal ideas, Ability to demonstrate self-control will improve and Ability to maintain clinical measurements within normal limits will improve  Physician Treatment Plan for Secondary Diagnosis: Active Problems:   Severe recurrent major depression w/psychotic features, mood-congruent (HCC)  Long Term Goal(s): Improvement in symptoms so as ready for discharge  Short Term Goals: Ability to identify changes in lifestyle to reduce recurrence of condition will improve, Ability to verbalize feelings will improve, Ability to disclose and discuss  suicidal ideas and Ability to demonstrate self-control will improve  I certify that inpatient services furnished can reasonably be expected to improve the patient's condition.    Malvin Johns, MD 12/17/20198:22 AM

## 2018-10-04 NOTE — Progress Notes (Signed)
Adult Psychoeducational Group Note  Date:  10/04/2018 Time:  8:58 PM  Group Topic/Focus:  Wrap-Up Group:   The focus of this group is to help patients review their daily goal of treatment and discuss progress on daily workbooks.  Participation Level:  Active  Participation Quality:  Appropriate  Affect:  Appropriate  Cognitive:  Appropriate  Insight: Appropriate  Engagement in Group:  Engaged  Modes of Intervention:  Discussion  Additional Comments:  The patient expressed that he rates today a 8.The patient also said that he attended group.  Octavio Manns 10/04/2018, 8:58 PM

## 2018-10-04 NOTE — ED Provider Notes (Signed)
Patient sent to me by Dr. Rubin Payor and assessed at 1:10 AM.  He was sleeping when he was awakened he was alert and oriented x4.  He was stable for transfer to behavioral health   Lorre Nick, MD 10/04/18 0111

## 2018-10-04 NOTE — Progress Notes (Signed)
Recreation Therapy Notes  Date: 12.17.19 Time: 1000 Location: [address] Dayroom  Group Topic: Wellness  Goal Area(s) Addresses:  Patient will define components of whole wellness. Patient will verbalize benefit of whole wellness.  Behavioral Response: Minimal  Intervention: Music  Activity: Exercise.  LRT led group in a series of stretches before going into exercises.  Once the stretching was completed, each patient got the opportunity to lead the group in multiple exercises.  Group was given water breaks and told to sit if they felt dizzy at any moment.  Education:Wellness, Discharge Planning.   Education Outcome: Acknowledges education/In group clarification offered/Needs additional education.   Clinical Observations/Feedback: Pt attempted to complete as many exercises as he could.  Pt had to sit down at least 2 times because of dizziness.  Pt was encouraged to sit down by LRT.  Pt would get back up when he felt ready.      Caroll Rancher, LRT/CTRS    Caroll Rancher A 10/04/2018 11:23 AM

## 2018-10-04 NOTE — ED Notes (Signed)
[facility] called for transport. 

## 2018-10-04 NOTE — [facility] Suicide Risk Assessment (Signed)
[facility] INPATIENT:  Family/Significant Other Suicide Prevention Education  Suicide Prevention Education:  Contact Attempts: partner Pedro Owens [phone] has been identified by the patient as the family member/significant other with whom the patient will be residing, and identified as the person(s) who will aid the patient in the event of a mental health crisis.  With written consent from the patient, two attempts were made to provide suicide prevention education, prior to and/or following the patient's discharge.  We were unsuccessful in providing suicide prevention education.  A suicide education pamphlet was given to the patient to share with family/significant other.  Date and time of first attempt: 10/04/18 at 3:55pm. No option to leave VM Date and time of second attempt: CSW to attempt at a later time  Pedro Owens 10/04/2018, 3:59 PM

## 2018-10-04 NOTE — [facility] Suicide Risk Assessment (Signed)
[facility] Admission Suicide Risk Assessment   Nursing information obtained from:  Patient Demographic factors:  Male Current Mental Status:  NA Loss Factors:  Financial problems / change in socioeconomic status, Decrease in vocational status Historical Factors:  Prior suicide attempts, Impulsivity Risk Reduction Factors:  NA  Total Time spent with patient: 45 minutes Principal Problem: Psychosis and suicidality in the context of HIV positive status, binging on cocaine and cold medications containing dextromethorphan, and chronic history of polysubstance abuse Diagnosis:  Active Problems:   Severe recurrent major depression w/psychotic features, mood-congruent (HCC)  Subjective Data: Endorsing suicidal thoughts recently but also endorsing he was trying to "get high" giving both answers  Continued Clinical Symptoms:  Alcohol Use Disorder Identification Test Final Score (AUDIT): 0 The "Alcohol Use Disorders Identification Test", Guidelines for Use in Primary Care, Second Edition.  World Science writer [facility]). Score between 0-7:  no or low risk or alcohol related problems. Score between 8-15:  moderate risk of alcohol related problems. Score between 16-19:  high risk of alcohol related problems. Score 20 or above:  warrants further diagnostic evaluation for alcohol dependence and treatment.   CLINICAL FACTORS:   Depression:   Comorbid alcohol abuse/dependence    COGNITIVE FEATURES THAT CONTRIBUTE TO RISK:  Thought constriction (tunnel vision)    SUICIDE RISK:   Mild:  Suicidal ideation of limited frequency, intensity, duration, and specificity.  There are no identifiable plans, no associated intent, mild dysphoria and related symptoms, good self-control (both objective and subjective assessment), few other risk factors, and identifiable protective factors, including available and accessible social support.  PLAN OF CARE: Multiple level of treatment including addressing HIV status  chemical dependency and depressive disorder  I certify that inpatient services furnished can reasonably be expected to improve the patient's condition.   Malvin Johns, MD 10/04/2018, 8:31 AM

## 2018-10-04 NOTE — Progress Notes (Signed)
Recreation Therapy Notes  INPATIENT RECREATION THERAPY ASSESSMENT  Patient Details Name: Pedro Owens MRN: [id number] DOB: 12-Mar-1991 Today's Date: 10/04/2018       Information Obtained From: Patient  Able to Participate in Assessment/Interview: Yes  Patient Presentation: Responsive, Confused  Reason for Admission (Per Patient): (Pt stated he didn't know why he was here.)  Patient Stressors: (Pt stated he didn't know.)  Coping Skills:   Isolation, Self-Injury, Sports, TV, Arguments, Aggression, Music, Substance Abuse, Impulsivity, Talk, Art, Prayer, Avoidance  Leisure Interests (2+):  Social - Friends, Individual - Writing  Frequency of Recreation/Participation: Weekly  Awareness of Community Resources:  Yes  Community Resources:  Restaurants, Other (Comment)(Museum)  Current Use: No  If no, Barriers?: (Pt stated he didn't know.)  Expressed Interest in [employer] Information: No  [place] of Residence:  [place]  Patient Main Form of Transportation: Walk  Patient Strengths:  Counselling psychologist; Writing  Patient Identified Areas of Improvement:  Work; Arts administrator  Patient Goal for Hospitalization:  "Get better"  Current SI (including self-harm):  No  Current HI:  No  Current AVH: No  Staff Intervention Plan: Group Attendance, Collaborate with Interdisciplinary Treatment Team  Consent to Intern Participation: N/A    Caroll Rancher, LRT/CTRS   Caroll Rancher A 10/04/2018, 11:54 AM

## 2018-10-04 NOTE — Progress Notes (Signed)
Pt was observed in the dayroom, attending wrap-up group. Pt appears animated in affect and mood. Pt denies SI/HI/AVH/Pain at this time. Pt remains confused. Disorganized thought process. Pt spends time in the dayroom, quiet and watchful. HFR maintained due to unsteady gait. Pt took bedtime meds ordered. Support provided. Will continue with POC.

## 2018-10-04 NOTE — Progress Notes (Signed)
NUTRITION ASSESSMENT  Pt identified as at risk on the Malnutrition Screen Tool  INTERVENTION: 1. Supplements: Ensure Enlive po BID, each supplement provides 350 kcal and 20 grams of protein  NUTRITION DIAGNOSIS: Unintentional weight loss related to sub-optimal intake as evidenced by pt report.   Goal: Pt to meet >/= 90% of their estimated nutrition needs.  Monitor:  PO intake  Assessment:  Pt admitted with polysubstance abuse (cocaine, Dextromethorphan). 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 Per weight history, pt has lost 39 lb since 8/2 (22% wt loss x 4.5 months, significant for time frame). Given weight loss and substance abuse, pt with increased needs. Will order Ensure supplements.   Height: Ht Readings from Last 1 Encounters:  10/04/18 5' 11.5" (1.816 m)    Weight: Wt Readings from Last 1 Encounters:  10/04/18 64 kg    Weight Hx: Wt Readings from Last 10 Encounters:  10/04/18 64 kg  10/03/18 64 kg  09/24/18 61.2 kg  05/20/18 81.6 kg  04/03/18 69.9 kg  11/29/17 70.7 kg  11/23/17 71.5 kg  11/15/17 76.2 kg  12/08/16 72.6 kg  11/04/16 76.2 kg    BMI:  Body mass index is 19.39 kg/m. Pt meets criteria for normal based on current BMI.  Estimated Nutritional Needs: Kcal: 25-30 kcal/kg Protein: > 1 gram protein/kg Fluid: 1 ml/kcal  Diet Order:  Diet Order            Diet regular Room service appropriate? Yes; Fluid consistency: Thin  Diet effective now             Pt is also offered choice of unit snacks mid-morning and mid-afternoon.  Pt is eating as desired.   Lab results and medications reviewed.   Tilda Franco, MS, RD, LDN [facility] Inpatient Clinical Dietitian Pager: [phone] After Hours Pager: [phone]
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
## 2018-10-05 DIAGNOSIS — F1410 Cocaine abuse, uncomplicated: Secondary | ICD-10-CM

## 2018-10-05 DIAGNOSIS — F419 Anxiety disorder, unspecified: Secondary | ICD-10-CM

## 2018-10-05 DIAGNOSIS — F333 Major depressive disorder, recurrent, severe with psychotic symptoms: Principal | ICD-10-CM

## 2018-10-05 MED ORDER — RISPERIDONE 2 MG PO TABS
4.0000 mg | ORAL_TABLET | Freq: Every day | ORAL | Status: DC
  Administered 2018-10-06: 4 mg via ORAL
  Filled 2018-10-05 (×3): qty 2

## 2018-10-05 MED ORDER — BENZTROPINE MESYLATE 0.5 MG PO TABS
0.5000 mg | ORAL_TABLET | Freq: Two times a day (BID) | ORAL | Status: DC
  Administered 2018-10-05 – 2018-10-07 (×4): 0.5 mg via ORAL
  Filled 2018-10-05 (×6): qty 1

## 2018-10-05 MED ORDER — RISPERIDONE 2 MG PO TABS
2.0000 mg | ORAL_TABLET | Freq: Every day | ORAL | Status: DC
  Administered 2018-10-05 – 2018-10-07 (×3): 2 mg via ORAL
  Filled 2018-10-05 (×5): qty 1

## 2018-10-05 MED ORDER — CLONAZEPAM 0.5 MG PO TABS
0.5000 mg | ORAL_TABLET | Freq: Three times a day (TID) | ORAL | Status: DC
  Administered 2018-10-05 – 2018-10-08 (×9): 0.5 mg via ORAL
  Filled 2018-10-05 (×10): qty 1

## 2018-10-05 NOTE — Progress Notes (Signed)
Recreation Therapy Notes  Date: 12.18.19 Time: 1000 Location: 500 Hall  Group Topic: Team Building, Problem Solving  Goal Area(s) Addresses:  Patient will effectively work with peer towards shared goal.  Patient will identify skill used to make activity successful.  Patient will identify how skills used during activity can be used to reach post d/c goals.   Behavioral Response: Minimal  Intervention: STEM Activity   Activity:  Sharks in [employer].  As a group, each person was given a rubber circle and one extra for the group.  Patients were to move from one end of the hall to the other and back using the rubber discs.  If anyone stepped off of their disc, the group would have to start over.    Education: Pharmacist, community, Building control surveyor.   Education Outcome: Acknowledges education/In group clarification offered/Needs additional education.   Clinical Observations/Feedback: Pt was confused but participated in the activity. Pt participated half way through the first part of group before leaving group.  Pt was pleasant.    Caroll Rancher, LRT/CTRS      Lillia Abed, Edson Deridder A 10/05/2018 11:27 AM

## 2018-10-05 NOTE — Progress Notes (Signed)
   10/05/18 0056  Vital Signs  Pulse Rate 80  Resp 18  BP 117/77  BP Location Left Arm  BP Method Automatic  Patient Position (if appropriate) Lying   Pt observed resting in bed with eyes closed. Respirations even and unlabored. No distress noted. See above for vitals. Pt remains safe. Q 15 checks in place.

## 2018-10-05 NOTE — Progress Notes (Signed)
Did not attend group 

## 2018-10-05 NOTE — Tx Team (Signed)
Interdisciplinary Treatment and Diagnostic Plan Update  10/05/2018 Time of Session:  Pedro Owens MRN: [id number]  Principal Diagnosis: <principal problem not specified>  Secondary Diagnoses: Active Problems:   Severe recurrent major depression w/psychotic features, mood-congruent (HCC)   Current Medications:  Current Facility-Administered Medications  Medication Dose Route Frequency Provider Last Rate Last Dose  . abacavir-dolutegravir-lamiVUDine (TRIUMEQ) 518-84-166 MG per tablet 1 tablet  1 tablet Oral Daily Lindon Romp A, NP   1 tablet at 10/05/18 0836  . alum & mag hydroxide-simeth (MAALOX/MYLANTA) 200-200-20 MG/5ML suspension 30 mL  30 mL Oral Q4H PRN Lindon Romp A, NP      . feeding supplement (ENSURE ENLIVE) (ENSURE ENLIVE) liquid 237 mL  237 mL Oral BID BM [name], MD   237 mL at 10/04/18 1437  . hydrOXYzine (ATARAX/VISTARIL) tablet 25 mg  25 mg Oral TID PRN Rozetta Nunnery, NP      . magnesium hydroxide (MILK OF MAGNESIA) suspension 30 mL  30 mL Oral Daily PRN Lindon Romp A, NP      . nicotine (NICODERM CQ - dosed in mg/24 hours) patch 21 mg  21 mg Transdermal Daily Lindon Romp A, NP   21 mg at 10/05/18 0837  . QUEtiapine (SEROQUEL) tablet 50 mg  50 mg Oral TID [name], MD   50 mg at 10/05/18 0836  . temazepam (RESTORIL) capsule 30 mg  30 mg Oral QHS [name], MD   30 mg at 10/04/18 2103   PTA Medications: Medications Prior to Admission  Medication Sig Dispense Refill Last Dose  . abacavir-dolutegravir-lamiVUDine (TRIUMEQ) 600-50-300 MG tablet Take 1 tablet by mouth daily. 30 tablet 5 10/03/2018 at Unknown time  . ARIPiprazole (ABILIFY) 2 MG tablet Take 1 tablet (2 mg total) by mouth daily. (Patient not taking: Reported on 10/03/2018) 30 tablet 0 Not Taking at Unknown time  . FLUoxetine (PROZAC) 20 MG capsule Take 1 capsule (20 mg total) by mouth daily. (Patient not taking: Reported on 10/03/2018) 30 capsule 0 Not Taking at Unknown time  . traZODone  (DESYREL) 100 MG tablet Take 1 tablet (100 mg total) by mouth at bedtime as needed for sleep. (Patient not taking: Reported on 10/03/2018) 30 tablet 0 Not Taking at Unknown time    Patient Stressors: Health problems Medication change or noncompliance Substance abuse  Patient Strengths: Capable of independent living Agricultural engineer for treatment/growth Supportive family/friends  Treatment Modalities: Medication Management, Group therapy, Case management,  1 to 1 session with clinician, Psychoeducation, Recreational therapy.   Physician Treatment Plan for Primary Diagnosis: <principal problem not specified> Long Term Goal(s): Improvement in symptoms so as ready for discharge Improvement in symptoms so as ready for discharge   Short Term Goals: Ability to verbalize feelings will improve Ability to disclose and discuss suicidal ideas Ability to demonstrate self-control will improve Ability to maintain clinical measurements within normal limits will improve Ability to identify changes in lifestyle to reduce recurrence of condition will improve Ability to verbalize feelings will improve Ability to disclose and discuss suicidal ideas Ability to demonstrate self-control will improve  Medication Management: Evaluate patient's response, side effects, and tolerance of medication regimen.  Therapeutic Interventions: 1 to 1 sessions, Unit Group sessions and Medication administration.  Evaluation of Outcomes: Not Met  Physician Treatment Plan for Secondary Diagnosis: Active Problems:   Severe recurrent major depression w/psychotic features, mood-congruent ([place])  Long Term Goal(s): Improvement in symptoms so as ready for discharge Improvement in symptoms so as ready for discharge  Short Term Goals: Ability to verbalize feelings will improve Ability to disclose and discuss suicidal ideas Ability to demonstrate self-control will improve Ability to maintain clinical  measurements within normal limits will improve Ability to identify changes in lifestyle to reduce recurrence of condition will improve Ability to verbalize feelings will improve Ability to disclose and discuss suicidal ideas Ability to demonstrate self-control will improve     Medication Management: Evaluate patient's response, side effects, and tolerance of medication regimen.  Therapeutic Interventions: 1 to 1 sessions, Unit Group sessions and Medication administration.  Evaluation of Outcomes: Not Met   RN Treatment Plan for Primary Diagnosis: <principal problem not specified> Long Term Goal(s): Knowledge of disease and therapeutic regimen to maintain health will improve  Short Term Goals: Ability to participate in decision making will improve, Ability to verbalize feelings will improve, Ability to disclose and discuss suicidal ideas, Ability to identify and develop effective coping behaviors will improve and Compliance with prescribed medications will improve  Medication Management: RN will administer medications as ordered by provider, will assess and evaluate patient's response and provide education to patient for prescribed medication. RN will report any adverse and/or side effects to prescribing provider.  Therapeutic Interventions: 1 on 1 counseling sessions, Psychoeducation, Medication administration, Evaluate responses to treatment, Monitor vital signs and CBGs as ordered, Perform/monitor CIWA, COWS, AIMS and Fall Risk screenings as ordered, Perform wound care treatments as ordered.  Evaluation of Outcomes: Not Met   LCSW Treatment Plan for Primary Diagnosis: <principal problem not specified> Long Term Goal(s): Safe transition to appropriate next level of care at discharge, Engage patient in therapeutic group addressing interpersonal concerns.  Short Term Goals: Engage patient in aftercare planning with referrals and resources  Therapeutic Interventions: Assess for all  discharge needs, 1 to 1 time with Social worker, Explore available resources and support systems, Assess for adequacy in community support network, Educate family and significant other(s) on suicide prevention, Complete Psychosocial Assessment, Interpersonal group therapy.  Evaluation of Outcomes: Not Met   Progress in Treatment: Attending groups: No. New to unit  Participating in groups: No. Taking medication as prescribed: Yes. Toleration medication: Yes. Family/Significant other contact made: No, will contact:  patient's mother or partner Patient understands diagnosis: Yes. Discussing patient identified problems/goals with staff: Yes. Medical problems stabilized or resolved: Yes. Denies suicidal/homicidal ideation: Yes. Issues/concerns per patient self-inventory: No. Other:   New problem(s) identified: None   New Short Term/Long Term Goal(s): medication stabilization, elimination of SI thoughts, development of comprehensive mental wellness plan.    Patient Goals: To start over and do right    Discharge Plan or Barriers: Patient lives alone in [place], [place]. Patient reports that they would like to be referred to an outpatient provider for continuity of care. CSW will continue to follow and for appropriate referrals and possible discharge planning.   Reason for Continuation of Hospitalization: Anxiety Depression Medication stabilization Suicidal ideation  Estimated Length of Stay: 3-5 days   Attendees: Patient: 10/05/2018 8:58 AM  Physician: Dr. [name], MD 10/05/2018 8:58 AM  Nursing: Roderic Palau.Delane Ginger, RN 10/05/2018 8:58 AM  RN Care Manager: Lars Pinks, RN 10/05/2018 8:58 AM  Social Worker: Radonna Ricker, [place] 10/05/2018 8:58 AM  Recreational Therapist:  10/05/2018 8:58 AM  Other:  10/05/2018 8:58 AM  Other:  10/05/2018 8:58 AM  Other: 10/05/2018 8:58 AM    Scribe for Treatment Team: Marylee Floras, [place] 10/05/2018 8:58 AM

## 2018-10-05 NOTE — Progress Notes (Signed)
   10/05/18 0500  Sleep  Number of Hours 5.5   Goal met with Restoril 30.
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
## 2018-10-05 NOTE — Plan of Care (Signed)
D: Patient presents anxious, shaky. He has a history of pseudoseizures, and is shaking when he is up to at the medication window. He replies "I don't know" to most every assessment question, but did denies SI/HI/AVH.  His responses are childlike. A: Patient checked q15 min, and checks reviewed. Reviewed medication changes with patient and educated on side effects. Educated patient on importance of attending group therapy sessions and educated on several coping skills. Encouarged participation in milieu through recreation therapy and attending meals with peers. Support and encouragement provided. Fluids offered. R: Patient receptive to education on medications, and is medication compliant. Patient contracts for safety on the unit.  Problem: Education: Goal: Knowledge of disease or condition will improve Outcome: Not Progressing Goal: Understanding of discharge needs will improve Outcome: Not Progressing   Problem: Health Behavior/Discharge Planning: Goal: Ability to identify changes in lifestyle to reduce recurrence of condition will improve Outcome: Not Progressing Goal: Identification of resources available to assist in meeting health care needs will improve Outcome: Not Progressing   Problem: Education: Goal: Ability to make informed decisions regarding treatment will improve Outcome: Not Progressing   Problem: Coping: Goal: Coping ability will improve Outcome: Not Progressing   Problem: Education: Goal: Knowledge of disease or condition will improve Outcome: Not Progressing Goal: Understanding of discharge needs will improve Outcome: Not Progressing   Problem: Health Behavior/Discharge Planning: Goal: Ability to identify changes in lifestyle to reduce recurrence of condition will improve Outcome: Not Progressing Goal: Identification of resources available to assist in meeting health care needs will improve Outcome: Not Progressing   Problem: Education: Goal: Ability to make  informed decisions regarding treatment will improve Outcome: Not Progressing   Problem: Coping: Goal: Coping ability will improve Outcome: Not Progressing

## 2018-10-05 NOTE — Progress Notes (Signed)
Pt woke up. No new c/o's. No abnormal s/s. Pt states "I'm good". Pt proceeded to go to breakfast.

## 2018-10-05 NOTE — Progress Notes (Signed)
[facility] MD Progress Note  10/05/2018 10:25 AM Pedro Owens  MRN:  [id number] Subjective:    Patient is seen in the day room and then later in his room he is a little bit evasive today and sometimes giving answers that are flipped for example when asked if he is hearing and seeing things he just states hearing and seeing you so he generally does not seem to have acute psychosis although he is difficult interview today because he seems a bit gamy but overall he has no EPS or TD denies wanting to harm self or others may have turned the corner and again we can probably discharge in 1 to 2 days   Principal Problem: <principal problem not specified> Diagnosis: Active Problems:   Severe recurrent major depression w/psychotic features, mood-congruent (HCC)  Total Time spent with patient: 20 minutes  Past Medical History:  Past Medical History:  Diagnosis Date  . ADHD (attention deficit hyperactivity disorder)   . Anal warts   . Anxiety   . Asthma   . Bipolar disorder (HCC)   . Chronic bronchitis (HCC)   . Chronic pain syndrome   . Convulsions (HCC) 08/14/2015  . Depression   . Gonorrhea 09/08/11  . Headache    "weekly" (11/06/2016)  . Hemorrhoids, internal, with bleeding 08/25/2011  . Hepatitis C   . Herpes   . HIV infection (HCC)   . Hypertension   . ILEITIS 09/24/2010   Qualifier: Diagnosis of  By: Nelson-Smith CMA (AAMA), Dottie    . Lymphoid hyperplasia, reactive   . Marijuana abuse   . Migraine    "monthly" (11/06/2016)  . Pseudoseizures   . Seizures (HCC)    "don't know why I have them" (11/06/2016)  . Syphilis     Past Surgical History:  Procedure Laterality Date  . ANKLE SURGERY Bilateral 2005   Screw & Pins  . ANKLE SURGERY Right 2008   "replaced pins & screws"  . FOOT SURGERY Bilateral 2005   "bone spurs"  . I&D EXTREMITY Right 06/26/2016   Procedure: IRRIGATION AND DEBRIDEMENT RIGHT FOREARM;  Surgeon: Betha Loa, MD;  Location: [facility] OR;  Service: Orthopedics;   Laterality: Right;  . I&D EXTREMITY Right 11/04/2016   Procedure: IRRIGATION AND DEBRIDEMENT EXTREMITY;  Surgeon: Dairl Ponder, MD;  Location: [facility] OR;  Service: Orthopedics;  Laterality: Right;  . I&D EXTREMITY Right 11/06/2016   Procedure: IRRIGATION AND DEBRIDEMENT right forarm;  Surgeon: Dairl Ponder, MD;  Location: [facility] OR;  Service: Orthopedics;  Laterality: Right;  . TEE WITHOUT CARDIOVERSION N/A 11/19/2017   Procedure: TRANSESOPHAGEAL ECHOCARDIOGRAM (TEE);  Surgeon: Pricilla Riffle, MD;  Location: [facility] ENDOSCOPY;  Service: Cardiovascular;  Laterality: N/A;  . VIDEO ASSISTED THORACOSCOPY (VATS)/EMPYEMA Left 11/26/2017   Procedure: VIDEO ASSISTED THORACOSCOPY (VATS)/DRAIN EMPYEMA;  Surgeon: Kerin Perna, MD;  Location: [facility] OR;  Service: Thoracic;  Laterality: Left;   Family History:  Family History  Problem Relation Age of Onset  . Diabetes Mother   . Irritable bowel syndrome Mother   . Hypertension Mother   . Hyperlipidemia Mother   . Hypothyroidism Mother   . Diabetes Maternal Uncle   . Heart disease Maternal Grandmother   . Leukemia Other        maternal greatgrandmother  . Colon cancer Neg Hx   Social History:  Social History   Substance and Sexual Activity  Alcohol Use Yes   Comment: 11/06/2016 "nothing in a very long time"     Social History   Substance  and Sexual Activity  Drug Use Yes  . Frequency: 2.0 times per week  . Types: Cocaine, Benzodiazepines, Oxycodone, Marijuana, Heroin   Comment: yesterday    Social History   Socioeconomic History  . Marital status: Single    Spouse name: Not on file  . Number of children: 0  . Years of education: Not on file  . Highest education level: Not on file  Occupational History  . Occupation: Production designer, theatre/television/film  Social Needs  . Financial resource strain: Not on file  . Food insecurity:    Worry: Not on file    Inability: Not on file  . Transportation needs:    Medical: Not on file    Non-medical: Not on file  Tobacco Use  .  Smoking status: Current Every Day Smoker    Packs/day: 1.50    Years: 11.00    Pack years: 16.50    Types: Cigarettes  . Smokeless tobacco: Never Used  Substance and Sexual Activity  . Alcohol use: Yes    Comment: 11/06/2016 "nothing in a very long time"  . Drug use: Yes    Frequency: 2.0 times per week    Types: Cocaine, Benzodiazepines, Oxycodone, Marijuana, Heroin    Comment: yesterday  . Sexual activity: Not Currently    Partners: Male    Birth control/protection: Condom  Lifestyle  . Physical activity:    Days per week: Not on file    Minutes per session: Not on file  . Stress: Not on file  Relationships  . Social connections:    Talks on phone: Not on file    Gets together: Not on file    Attends religious service: Not on file    Active member of club or organization: Not on file    Attends meetings of clubs or organizations: Not on file    Relationship status: Not on file  Other Topics Concern  . Not on file  Social History Narrative   ** Merged History Encounter **       Additional Social History:                         Sleep: Good  Appetite:  Good  Current Medications: Current Facility-Administered Medications  Medication Dose Route Frequency Provider Last Rate Last Dose  . abacavir-dolutegravir-lamiVUDine (TRIUMEQ) 600-50-300 MG per tablet 1 tablet  1 tablet Oral Daily Nira Conn A, NP   1 tablet at 10/05/18 0836  . alum & mag hydroxide-simeth (MAALOX/MYLANTA) 200-200-20 MG/5ML suspension 30 mL  30 mL Oral Q4H PRN Nira Conn A, NP      . feeding supplement (ENSURE ENLIVE) (ENSURE ENLIVE) liquid 237 mL  237 mL Oral BID BM Malvin Johns, MD   237 mL at 10/04/18 1437  . hydrOXYzine (ATARAX/VISTARIL) tablet 25 mg  25 mg Oral TID PRN Jackelyn Poling, NP      . magnesium hydroxide (MILK OF MAGNESIA) suspension 30 mL  30 mL Oral Daily PRN Nira Conn A, NP      . nicotine (NICODERM CQ - dosed in mg/24 hours) patch 21 mg  21 mg Transdermal Daily Nira Conn A, NP   21 mg at 10/05/18 0837  . QUEtiapine (SEROQUEL) tablet 50 mg  50 mg Oral TID Malvin Johns, MD   50 mg at 10/05/18 0836  . temazepam (RESTORIL) capsule 30 mg  30 mg Oral QHS Malvin Johns, MD   30 mg at 10/04/18 2103    Lab Results:  Results for orders placed or performed during the hospital encounter of 10/03/18 (from the past 48 hour(s))  Comprehensive metabolic panel     Status: Abnormal   Collection Time: 10/03/18  5:43 PM  Result Value Ref Range   Sodium 142 135 - 145 mmol/L   Potassium 4.4 3.5 - 5.1 mmol/L   Chloride 110 98 - 111 mmol/L   CO2 25 22 - 32 mmol/L   Glucose, Bld 93 70 - 99 mg/dL   BUN 32 (H) 6 - 20 mg/dL   Creatinine, Ser 4.09 0.61 - 1.24 mg/dL   Calcium 8.8 (L) 8.9 - 10.3 mg/dL   Total Protein 7.6 6.5 - 8.1 g/dL   Albumin 4.0 3.5 - 5.0 g/dL   AST 48 (H) 15 - 41 U/L   ALT 56 (H) 0 - 44 U/L   Alkaline Phosphatase 76 38 - 126 U/L   Total Bilirubin 0.9 0.3 - 1.2 mg/dL   GFR calc non Af Amer >60 >60 mL/min   GFR calc Af Amer >60 >60 mL/min   Anion gap 7 5 - 15    Comment: Performed at [facility], [address], [place], [place] [address]  Ethanol     Status: None   Collection Time: 10/03/18  5:43 PM  Result Value Ref Range   Alcohol, Ethyl (B) <10 <10 mg/dL    Comment: (NOTE) Lowest detectable limit for serum alcohol is 10 mg/dL. For medical purposes only. Performed at [facility], [address] [place], [place] [address]   Salicylate level     Status: None   Collection Time: 10/03/18  5:43 PM  Result Value Ref Range   Salicylate Lvl <7.0 2.8 - 30.0 mg/dL    Comment: Performed at [facility], [address], [place], [place] [address]  Acetaminophen level     Status: Abnormal   Collection Time: 10/03/18  5:43 PM  Result Value Ref Range   Acetaminophen (Tylenol), Serum <10 (L) 10 - 30 ug/mL    Comment: (NOTE) Therapeutic concentrations vary significantly. A range of 10-30 ug/mL   may be an effective concentration for many patients. However, some  are best treated at concentrations outside of this range. Acetaminophen concentrations >150 ug/mL at 4 hours after ingestion  and >50 ug/mL at 12 hours after ingestion are often associated with  toxic reactions. Performed at [facility], [address], [place], [place] [address]   cbc     Status: None   Collection Time: 10/03/18  5:43 PM  Result Value Ref Range   WBC 9.3 4.0 - 10.5 K/uL   RBC 5.65 4.22 - 5.81 MIL/uL   Hemoglobin 15.8 13.0 - 17.0 g/dL   HCT 84.6 96.2 - 95.2 %   MCV 87.4 80.0 - 100.0 fL   MCH 28.0 26.0 - 34.0 pg   MCHC 32.0 30.0 - 36.0 g/dL   RDW 84.1 32.4 - 40.1 %   Platelets 298 150 - 400 K/uL   nRBC 0.0 0.0 - 0.2 %    Comment: Performed at [facility], [address] [place], [place] [address]  Rapid urine drug screen (hospital performed)     Status: Abnormal   Collection Time: 10/03/18  6:17 PM  Result Value Ref Range   Opiates NONE DETECTED NONE DETECTED   Cocaine POSITIVE (A) NONE DETECTED   Benzodiazepines POSITIVE (A) NONE DETECTED   Amphetamines NONE DETECTED NONE DETECTED   Tetrahydrocannabinol POSITIVE (A) NONE  DETECTED   Barbiturates NONE DETECTED NONE DETECTED    Comment: (NOTE) DRUG SCREEN FOR MEDICAL PURPOSES ONLY.  IF CONFIRMATION IS NEEDED FOR ANY PURPOSE, NOTIFY LAB WITHIN 5 DAYS. LOWEST DETECTABLE LIMITS FOR URINE DRUG SCREEN Drug Class                     Cutoff (ng/mL) Amphetamine and metabolites    1000 Barbiturate and metabolites    200 Benzodiazepine                 200 Tricyclics and metabolites     300 Opiates and metabolites        300 Cocaine and metabolites        300 THC                            50 Performed at [facility], [address] [place], [place] [address]     Blood Alcohol level:  Lab Results  Component Value Date   ETH <10 10/03/2018   ETH <10 09/24/2018    Metabolic  Disorder Labs: Lab Results  Component Value Date   HGBA1C 5.8 (H) 11/25/2017   MPG 119.76 11/25/2017   No results found for: PROLACTIN Lab Results  Component Value Date   CHOL 97 01/06/2017   TRIG 114 01/06/2017   HDL 39 (L) 01/06/2017   CHOLHDL 2.5 01/06/2017   VLDL 23 01/06/2017   LDLCALC 35 01/06/2017   LDLCALC 43 03/26/2016    Physical Findings: AIMS: Facial and Oral Movements Muscles of Facial Expression: None, normal Lips and Perioral Area: None, normal Jaw: None, normal Tongue: None, normal,Extremity Movements Upper (arms, wrists, hands, fingers): None, normal Lower (legs, knees, ankles, toes): None, normal, Trunk Movements Neck, shoulders, hips: None, normal, Overall Severity Severity of abnormal movements (highest score from questions above): None, normal Incapacitation due to abnormal movements: None, normal Patient's awareness of abnormal movements (rate only patient's report): No Awareness, Dental Status Current problems with teeth and/or dentures?: No Does patient usually wear dentures?: No  CIWA:  CIWA-Ar Total: 2 COWS:  COWS Total Score: 1  Musculoskeletal: Strength & Muscle Tone: within normal limits Gait & Station: normal Patient leans: N/A  Psychiatric Specialty Exam: Physical Exam  ROS  Blood pressure 136/87, pulse 90, temperature (!) 97.4 F (36.3 C), temperature source Oral, resp. rate 18, height 5' 11.5" (1.816 m), weight 64 kg.Body mass index is 19.39 kg/m.  General Appearance: Casual  Eye Contact:  Good  Speech:  Clear and Coherent  Volume:  Decreased  Mood: Overall stability  Affect:  Congruent  Thought Process:  Linear  Orientation:  Full (Time, Place, and Person)  Thought Content:  Tangential  Suicidal Thoughts:  No  Homicidal Thoughts:  No  Memory:  Immediate;   Fair  Judgement:  Fair  Insight:  Fair  Psychomotor Activity:  Normal  Concentration:  Concentration: Good  Recall:  Good  Fund of Knowledge:  Good  Language:  Good   Akathisia:  Negative  Handed:  Right  AIMS (if indicated):     Assets:  Communication Skills Desire for Improvement  ADL's:  Intact  Cognition:  WNL  Sleep:  Number of Hours: 5.5   Plans are continue antipsychotic and cognitive and reality based therapies for psychosis, continue HIV meds, concerned about HIV encephalopathy discussed with patient and probable discharge planning for tomorrow Treatment Plan Summary: Daily contact with patient to assess and  evaluate symptoms and progress in treatment and Medication management  Malvin Johns, MD 10/05/2018, 10:25 AM
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
## 2018-10-05 NOTE — Therapy (Signed)
Occupational Therapy Group Note  Date:  10/05/2018 Time:  11:31 AM  Group Topic/Focus:  Stress Management  Participation Level:  Active  Participation Quality:  Appropriate  Affect:  Flat  Cognitive:  Alert  Insight: Limited  Engagement in Group:  Engaged  Modes of Intervention:  Activity, Discussion, Education and Socialization  Additional Comments:    S: "Journaling is good for your mental health"  O: Education given on stress management and healthy coping mechanisms. Pt encouraged to brainstorm with other peers and discuss what has worked in the past vs what has not. Pts further encouraged to discuss new coping stress management strategies to implement this date. Art activity made to display preferred coping mechanisms, along with incorporating the stress management outlet of coloring/art.   A: Pt presents to group with flat affect, affect improved to jovial/laughing when discussing preferred activities. Pt requiring min A to follow instructions. Once completing activity, pt stating very happily "I did a good job" and laughing and smiling. Pt sharing his activity with person beside him and appropriately socializing.   P: Pt provided with education on stress management activities to implement into daily routine. Handouts given to facilitate carryover when reintegrating into community   Dalphine Handing, MSOT, OTR/L [employer] OT/ Acute Relief OT PHP Office: [phone]  Dalphine Handing 10/05/2018, 11:31 AM

## 2018-10-06 NOTE — Progress Notes (Signed)
Pt reports his day was good.  He denies SI/HI/AVH at this time.  He is observed restless and fidgety during the evening.  He makes his needs known to staff.  Writer reviewed his evening meds with him, and pt voiced understanding.  Pt was pleasant and cooperative with Press photographer.  Support and encouragement offered.  Discharge plans are in process.  Safety maintained with q15 minute checks.

## 2018-10-06 NOTE — Progress Notes (Signed)
Adult Psychoeducational Group Note  Date:  10/06/2018 Time:  8:45 PM  Group Topic/Focus:  Wrap-Up Group:   The focus of this group is to help patients review their daily goal of treatment and discuss progress on daily workbooks.  Participation Level:  Active  Participation Quality:  Appropriate  Affect:  Appropriate  Cognitive:  Appropriate  Insight: Appropriate  Engagement in Group:  Engaged  Modes of Intervention:  Discussion  Additional Comments: The patient expressed that he attended groups.The patient also said that he rates today a 8.  Octavio Mannshigpen, Cabela Pacifico Lee 10/06/2018, 8:45 PM

## 2018-10-06 NOTE — Progress Notes (Signed)
D: Pt denies SI/HI/AVH. Pt lethargic this evening, very avoidant when trying to assess.  A: Pt was offered support and encouragement. Pt was encourage to attend groups. Q 15 minute checks were done for safety.   R: safety maintained on unit.   Problem: Activity: Goal: Sleeping patterns will improve Outcome: Progressing

## 2018-10-06 NOTE — Progress Notes (Signed)
[facility]  10/06/2018 9:18 AM Pedro Owens  MRN:  [id number] Subjective:    Due to psychosis patient provides little information today.  Patient simply echoes the questions on mental status exam and appears confused and anxious  When asked if he is hearing and seeing things to simply repeats hearing things  However shakes his head no when asked if he wants to harm himself.  No EPS or TD  Again limited exam due to psychosis  Principal Problem: <principal problem not specified> Diagnosis: Active Problems:   Severe recurrent major depression w/psychotic features, mood-congruent (HCC)  Total Time spent with patient: 20 minutes  Past Medical History:  Past Medical History:  Diagnosis Date  . ADHD (attention deficit hyperactivity disorder)   . Anal warts   . Anxiety   . Asthma   . Bipolar disorder (HCC)   . Chronic bronchitis (HCC)   . Chronic pain syndrome   . Convulsions (HCC) 08/14/2015  . Depression   . Gonorrhea 09/08/11  . Headache    "weekly" (11/06/2016)  . Hemorrhoids, internal, with bleeding 08/25/2011  . Hepatitis C   . Herpes   . HIV infection (HCC)   . Hypertension   . ILEITIS 09/24/2010   Qualifier: Diagnosis of  By: Nelson-Smith CMA (AAMA), Dottie    . Lymphoid hyperplasia, reactive   . Marijuana abuse   . Migraine    "monthly" (11/06/2016)  . Pseudoseizures   . Seizures (HCC)    "don't know why I have them" (11/06/2016)  . Syphilis     Past Surgical History:  Procedure Laterality Date  . ANKLE SURGERY Bilateral 2005   Screw & Pins  . ANKLE SURGERY Right 2008   "replaced pins & screws"  . FOOT SURGERY Bilateral 2005   "bone spurs"  . I&D EXTREMITY Right 06/26/2016   Procedure: IRRIGATION AND DEBRIDEMENT RIGHT FOREARM;  Surgeon: Betha LoaKevin Kuzma, MD;  Location: [facility] OR;  Service: Orthopedics;  Laterality: Right;  . I&D EXTREMITY Right 11/04/2016   Procedure: IRRIGATION AND DEBRIDEMENT EXTREMITY;  Surgeon: Dairl PonderMatthew Weingold, MD;  Location: [facility] OR;   Service: Orthopedics;  Laterality: Right;  . I&D EXTREMITY Right 11/06/2016   Procedure: IRRIGATION AND DEBRIDEMENT right forarm;  Surgeon: Dairl PonderMatthew Weingold, MD;  Location: [facility] OR;  Service: Orthopedics;  Laterality: Right;  . TEE WITHOUT CARDIOVERSION N/A 11/19/2017   Procedure: TRANSESOPHAGEAL ECHOCARDIOGRAM (TEE);  Surgeon: Pricilla Riffleoss, Paula V, MD;  [facility];  Service: Cardiovascular;  Laterality: N/A;  . VIDEO ASSISTED THORACOSCOPY (VATS)/EMPYEMA Left 11/26/2017   Procedure: VIDEO ASSISTED THORACOSCOPY (VATS)/DRAIN EMPYEMA;  Surgeon: Kerin PernaVan Trigt, Peter, MD;  [facility];  Service: Thoracic;  Laterality: Left;   Family History:  Family History  Problem Relation Age of Onset  . Diabetes Mother   . Irritable bowel syndrome Mother   . Hypertension Mother   . Hyperlipidemia Mother   . Hypothyroidism Mother   . Diabetes Maternal Uncle   . Heart disease Maternal Grandmother   . Leukemia Other        maternal greatgrandmother  . Colon cancer Neg Hx    Social History:  Social History   Substance and Sexual Activity  Alcohol Use Yes   Comment: 11/06/2016 "nothing in a very long time"     Social History   Substance and Sexual Activity  Drug Use Yes  . Frequency: 2.0 times per week  . Types: Cocaine, Benzodiazepines, Oxycodone, Marijuana, Heroin   Comment: yesterday    Social History   Socioeconomic  History  . Marital status: Single    Spouse name: Not on file  . Number of children: 0  . Years of education: Not on file  . Highest education level: Not on file  Occupational History  . Occupation: Production designer, theatre/television/film  Social Needs  . Financial resource strain: Not on file  . Food insecurity:    Worry: Not on file    Inability: Not on file  . Transportation needs:    Medical: Not on file    Non-medical: Not on file  Tobacco Use  . Smoking status: Current Every Day Smoker    Packs/day: 1.50    Years: 11.00    Pack years: 16.50    Types: Cigarettes  . Smokeless tobacco: Never  Used  Substance and Sexual Activity  . Alcohol use: Yes    Comment: 11/06/2016 "nothing in a very long time"  . Drug use: Yes    Frequency: 2.0 times per week    Types: Cocaine, Benzodiazepines, Oxycodone, Marijuana, Heroin    Comment: yesterday  . Sexual activity: Not Currently    Partners: Male    Birth control/protection: Condom  Lifestyle  . Physical activity:    Days per week: Not on file    Minutes per session: Not on file  . Stress: Not on file  Relationships  . Social connections:    Talks on phone: Not on file    Gets together: Not on file    Attends religious service: Not on file    Active member of club or organization: Not on file    Attends meetings of clubs or organizations: Not on file    Relationship status: Not on file  Other Topics Concern  . Not on file  Social History Narrative   ** Merged History Encounter **       Additional Social History:                         Sleep: Fair  Appetite:  Fair  Current Medications: Current Facility-Administered Medications  Medication Dose Route Frequency Provider Last Rate Last Dose  . abacavir-dolutegravir-lamiVUDine (TRIUMEQ) 600-50-300 MG per tablet 1 tablet  1 tablet Oral Daily Nira Conn A, NP   1 tablet at 10/06/18 0808  . alum & mag hydroxide-simeth (MAALOX/MYLANTA) 200-200-20 MG/5ML suspension 30 mL  30 mL Oral Q4H PRN Nira Conn A, NP      . benztropine (COGENTIN) tablet 0.5 mg  0.5 mg Oral BID Malvin Johns, MD   0.5 mg at 10/06/18 8295  . clonazePAM (KLONOPIN) tablet 0.5 mg  0.5 mg Oral TID Malvin Johns, MD   0.5 mg at 10/06/18 6213  . feeding supplement (ENSURE ENLIVE) (ENSURE ENLIVE) liquid 237 mL  237 mL Oral BID BM Malvin Johns, MD   237 mL at 10/06/18 0810  . hydrOXYzine (ATARAX/VISTARIL) tablet 25 mg  25 mg Oral TID PRN Jackelyn Poling, NP      . magnesium hydroxide (MILK OF MAGNESIA) suspension 30 mL  30 mL Oral Daily PRN Nira Conn A, NP      . nicotine (NICODERM CQ - dosed in mg/24  hours) patch 21 mg  21 mg Transdermal Daily Nira Conn A, NP   21 mg at 10/06/18 0815  . risperiDONE (RISPERDAL) tablet 2 mg  2 mg Oral Daily Malvin Johns, MD   2 mg at 10/06/18 0809  . risperiDONE (RISPERDAL) tablet 4 mg  4 mg Oral QHS Malvin Johns, MD      .  temazepam (RESTORIL) capsule 30 mg  30 mg Oral QHS Malvin Johns, MD   30 mg at 10/04/18 2103    Lab Results: No results found for this or any previous visit (from the past 48 hour(s)).  Blood Alcohol level:  Lab Results  Component Value Date   ETH <10 10/03/2018   ETH <10 09/24/2018    Metabolic Disorder Labs: Lab Results  Component Value Date   HGBA1C 5.8 (H) 11/25/2017   MPG 119.76 11/25/2017   No results found for: PROLACTIN Lab Results  Component Value Date   CHOL 97 01/06/2017   TRIG 114 01/06/2017   HDL 39 (L) 01/06/2017   CHOLHDL 2.5 01/06/2017   VLDL 23 01/06/2017   LDLCALC 35 01/06/2017   LDLCALC 43 03/26/2016    Physical Findings: AIMS: Facial and Oral Movements Muscles of Facial Expression: None, normal Lips and Perioral Area: None, normal Jaw: None, normal Tongue: None, normal,Extremity Movements Upper (arms, wrists, hands, fingers): None, normal Lower (legs, knees, ankles, toes): None, normal, Trunk Movements Neck, shoulders, hips: None, normal, Overall Severity Severity of abnormal movements (highest score from questions above): None, normal Incapacitation due to abnormal movements: None, normal Patient's awareness of abnormal movements (rate only patient's report): No Awareness, Dental Status Current problems with teeth and/or dentures?: No Does patient usually wear dentures?: No  CIWA:  CIWA-Ar Total: 2 COWS:  COWS Total Score: 1  Musculoskeletal: Strength & Muscle Tone: within normal limits Gait & Station: normal Patient leans: N/A  Psychiatric Specialty Exam: Physical Exam  ROS  Blood pressure 126/83, pulse 70, temperature (!) 97.5 F (36.4 C), temperature source Oral, resp. rate  18, height 5' 11.5" (1.816 m), weight 64 kg.Body mass index is 19.39 kg/m.  General Appearance: Casual  Eye Contact:  Fair  Speech:  Slow  Volume:  Decreased  Mood:  Anxious and Dysphoric  Affect:  Flat and Restricted  Thought Process:  Disorganized  Orientation:  Full (Time, Place, and Person)-thinks it is March the and states it is Christmas time  Thought Content:  Illogical  Suicidal Thoughts:  No  Homicidal Thoughts:  No  Memory:  Immediate;   Poor  Judgement:  Poor  Insight:  Shallow  Psychomotor Activity:  Decreased  Concentration:  Concentration: Poor  Recall:  Poor  Fund of Knowledge:  Poor  Language:  Poor  Akathisia:  Negative  Handed:  Right  AIMS (if indicated):     Assets:  Physical Health  ADL's:  Intact  Cognition:  WNL  Sleep:  Number of Hours: 10.75   We will continue antipsychotics and reality based therapy for his cognitive issues related to psychosis continue reality based therapy and med education again may require some redirection given confusion monitor on every 15 minute checks  Treatment Plan Summary: Daily contact with patient to assess and evaluate symptoms and progress in treatment  [place], MD 10/06/2018, 9:18 AM

## 2018-10-06 NOTE — Progress Notes (Signed)
Recreation Therapy Notes  Date: 12.19.19 Time: 1000 Location:  500 [address] Dayroom  Group Topic: Communication, Team Building, Problem Solving  Goal Area(s) Addresses:  Patient will effectively work with peer towards shared goal.  Patient will identify skills used to make activity successful.  Patient will identify how skills used during activity can be used to reach post d/c goals.   Intervention: STEM Activity  Activity: Stage manager. In teams patients were given 12 plastic drinking straws and a length of masking tape. Using the materials provided patients were asked to build a landing pad to catch a golf ball dropped from approximately 6 feet in the air.   Education: Pharmacist, community, Discharge Planning   Education Outcome: Acknowledges education/In group clarification offered/Needs additional education.   Clinical Observations/Feedback: Pt did not attend group.    Caroll Rancher, LRT/CTRS         Caroll Rancher A 10/06/2018 12:01 PM

## 2018-10-06 NOTE — [facility] Group Notes (Signed)
[facility] Group Notes:  (Nursing/MHT/Case Management/Adjunct)  Date:  10/06/2018  Time:  5:19 PM  Type of Therapy:  Psychoeducational Skills  Participation Level:  Active  Participation Quality:  Appropriate  Affect:  Appropriate  Cognitive:  Alert and Appropriate  Insight:  Appropriate  Engagement in Group:  Engaged  Modes of Intervention:  Activity  Summary of Progress/Problems: Patient attended and actively participated.  Pedro Owens O Pedro Owens 10/06/2018, 5:19 PM

## 2018-10-07 MED ORDER — OMEGA-3-ACID ETHYL ESTERS 1 G PO CAPS
1.0000 g | ORAL_CAPSULE | Freq: Two times a day (BID) | ORAL | Status: DC
  Administered 2018-10-07 – 2018-10-11 (×9): 1 g via ORAL
  Filled 2018-10-07 (×11): qty 1

## 2018-10-07 MED ORDER — OLANZAPINE 10 MG PO TABS
10.0000 mg | ORAL_TABLET | Freq: Every day | ORAL | Status: DC
  Administered 2018-10-07 – 2018-10-10 (×4): 10 mg via ORAL
  Filled 2018-10-07 (×5): qty 1

## 2018-10-07 MED ORDER — PRENATAL MULTIVITAMIN CH
1.0000 | ORAL_TABLET | Freq: Every day | ORAL | Status: DC
  Administered 2018-10-07 – 2018-10-09 (×2): 1 via ORAL
  Filled 2018-10-07 (×5): qty 1

## 2018-10-07 MED ORDER — IBUPROFEN 600 MG PO TABS
600.0000 mg | ORAL_TABLET | Freq: Four times a day (QID) | ORAL | Status: DC | PRN
  Administered 2018-10-08: 600 mg via ORAL
  Filled 2018-10-07: qty 1

## 2018-10-07 NOTE — Progress Notes (Addendum)
[facility]  10/07/2018 9:05 AM Jovita GammaXavier L Hjort  MRN:  [id number] Subjective:   Patient remains confused and anxious although he denies all psychotic symptoms.  In summary, Mr. Pedro Owens was admitted on 12/17 as a voluntary patient with a chief complaint of intense depression, and suicidal thoughts in the context of binging on crack cocaine as well as abusing high amount of Coricidin HBP Cough & Cold, which contains the hallucinogenic ingredient dextromethorphan.  He does have an extensive past psychiatric and chemical dependency history, abusing and becoming dependent on cannabis, cocaine, and heroin in the past.  He also has a history of pseudoseizures, personality disorder, and poor compliance with HIV medications.  Since he has been here for this admission, he has been treated here for his confusional state, and has presented daily with some degree of anxiety, and is failed to make a great deal of progress.  At the present time he is alert and oriented to person place situation he continues to perseverate and think that the month is March and then correct himself to say it is December, he acknowledges having difficulty collecting his  Thoughts. He has consistently denied hallucinations.  Should be noted that he has a history of employment for 8 years and has been generally high functioning but was recently fired from his job for stealing money so there is obviously a significant psychosocial stressor.  Team is also discussed the possibility of HIV encephalopathy given the cluster of symptoms that include persistent cognitive impairment  Principal Problem: Presenting with depression and suicidality now presenting with more of a cluster of symptoms Diagnosis: Active Problems:   Severe recurrent major depression w/psychotic features, mood-congruent (HCC)  Total Time spent with patient: 30 minutes  Past Psychiatric History: neg  Past Medical History:  Past Medical History:   Diagnosis Date  . ADHD (attention deficit hyperactivity disorder)   . Anal warts   . Anxiety   . Asthma   . Bipolar disorder (HCC)   . Chronic bronchitis (HCC)   . Chronic pain syndrome   . Convulsions (HCC) 08/14/2015  . Depression   . Gonorrhea 09/08/11  . Headache    "weekly" (11/06/2016)  . Hemorrhoids, internal, with bleeding 08/25/2011  . Hepatitis C   . Herpes   . HIV infection (HCC)   . Hypertension   . ILEITIS 09/24/2010   Qualifier: Diagnosis of  By: Nelson-Smith CMA (AAMA), Dottie    . Lymphoid hyperplasia, reactive   . Marijuana abuse   . Migraine    "monthly" (11/06/2016)  . Pseudoseizures   . Seizures (HCC)    "don't know why I have them" (11/06/2016)  . Syphilis     Past Surgical History:  Procedure Laterality Date  . ANKLE SURGERY Bilateral 2005   Screw & Pins  . ANKLE SURGERY Right 2008   "replaced pins & screws"  . FOOT SURGERY Bilateral 2005   "bone spurs"  . I&D EXTREMITY Right 06/26/2016   Procedure: IRRIGATION AND DEBRIDEMENT RIGHT FOREARM;  Surgeon: Betha LoaKevin Kuzma, MD;  Location: [facility] OR;  Service: Orthopedics;  Laterality: Right;  . I&D EXTREMITY Right 11/04/2016   Procedure: IRRIGATION AND DEBRIDEMENT EXTREMITY;  Surgeon: Dairl PonderMatthew Weingold, MD;  Location: [facility] OR;  Service: Orthopedics;  Laterality: Right;  . I&D EXTREMITY Right 11/06/2016   Procedure: IRRIGATION AND DEBRIDEMENT right forarm;  Surgeon: Dairl PonderMatthew Weingold, MD;  Location: [facility] OR;  Service: Orthopedics;  Laterality: Right;  . TEE WITHOUT CARDIOVERSION N/A 11/19/2017   Procedure: TRANSESOPHAGEAL ECHOCARDIOGRAM (  TEE);  Surgeon: Pricilla Riffle, MD;  [facility] ENDOSCOPY;  Service: Cardiovascular;  Laterality: N/A;  . VIDEO ASSISTED THORACOSCOPY (VATS)/EMPYEMA Left 11/26/2017   Procedure: VIDEO ASSISTED THORACOSCOPY (VATS)/DRAIN EMPYEMA;  Surgeon: Kerin Perna, MD;  Location: [facility] OR;  Service: Thoracic;  Laterality: Left;   Family History:  Family History  Problem Relation Age of Onset  . Diabetes  Mother   . Irritable bowel syndrome Mother   . Hypertension Mother   . Hyperlipidemia Mother   . Hypothyroidism Mother   . Diabetes Maternal Uncle   . Heart disease Maternal Grandmother   . Leukemia Other        maternal greatgrandmother  . Colon cancer Neg Hx    Family Psychiatric  History: neg Social History:  Social History   Substance and Sexual Activity  Alcohol Use Yes   Comment: 11/06/2016 "nothing in a very long time"     Social History   Substance and Sexual Activity  Drug Use Yes  . Frequency: 2.0 times per week  . Types: Cocaine, Benzodiazepines, Oxycodone, Marijuana, Heroin   Comment: yesterday    Social History   Socioeconomic History  . Marital status: Single    Spouse name: Not on file  . Number of children: 0  . Years of education: Not on file  . Highest education level: Not on file  Occupational History  . Occupation: Production designer, theatre/television/film  Social Needs  . Financial resource strain: Not on file  . Food insecurity:    Worry: Not on file    Inability: Not on file  . Transportation needs:    Medical: Not on file    Non-medical: Not on file  Tobacco Use  . Smoking status: Current Every Day Smoker    Packs/day: 1.50    Years: 11.00    Pack years: 16.50    Types: Cigarettes  . Smokeless tobacco: Never Used  Substance and Sexual Activity  . Alcohol use: Yes    Comment: 11/06/2016 "nothing in a very long time"  . Drug use: Yes    Frequency: 2.0 times per week    Types: Cocaine, Benzodiazepines, Oxycodone, Marijuana, Heroin    Comment: yesterday  . Sexual activity: Not Currently    Partners: Male    Birth control/protection: Condom  Lifestyle  . Physical activity:    Days per week: Not on file    Minutes per session: Not on file  . Stress: Not on file  Relationships  . Social connections:    Talks on phone: Not on file    Gets together: Not on file    Attends religious service: Not on file    Active member of club or organization: Not on file     Attends meetings of clubs or organizations: Not on file    Relationship status: Not on file  Other Topics Concern  . Not on file  Social History Narrative   ** Merged History Encounter **       Sleep: Fair  Appetite:  Fair  Current Medications: Current Facility-Administered Medications  Medication Dose Route Frequency Provider Last Rate Last Dose  . abacavir-dolutegravir-lamiVUDine (TRIUMEQ) 600-50-300 MG per tablet 1 tablet  1 tablet Oral Daily Nira Conn A, NP   1 tablet at 10/07/18 [id number]  . alum & mag hydroxide-simeth (MAALOX/MYLANTA) 200-200-20 MG/5ML suspension 30 mL  30 mL Oral Q4H PRN Nira Conn A, NP      . clonazePAM (KLONOPIN) tablet 0.5 mg  0.5 mg Oral TID Malvin Johns,  MD   0.5 mg at 10/07/18 0844  . feeding supplement (ENSURE ENLIVE) (ENSURE ENLIVE) liquid 237 mL  237 mL Oral BID BM Malvin Johns, MD   237 mL at 10/06/18 1522  . hydrOXYzine (ATARAX/VISTARIL) tablet 25 mg  25 mg Oral TID PRN Jackelyn Poling, NP      . magnesium hydroxide (MILK OF MAGNESIA) suspension 30 mL  30 mL Oral Daily PRN Nira Conn A, NP      . nicotine (NICODERM CQ - dosed in mg/24 hours) patch 21 mg  21 mg Transdermal Daily Nira Conn A, NP   21 mg at 10/07/18 0849  . OLANZapine (ZYPREXA) tablet 10 mg  10 mg Oral QHS Malvin Johns, MD      . omega-3 acid ethyl esters (LOVAZA) capsule 1 g  1 g Oral BID Malvin Johns, MD      . prenatal multivitamin tablet 1 tablet  1 tablet Oral Q1200 Malvin Johns, MD      . temazepam (RESTORIL) capsule 30 mg  30 mg Oral QHS Malvin Johns, MD   30 mg at 10/06/18 2136    Lab Results: No results found for this or any previous visit (from the past 48 hour(s)).  Blood Alcohol level:  Lab Results  Component Value Date   ETH <10 10/03/2018   ETH <10 09/24/2018    Metabolic Disorder Labs: Lab Results  Component Value Date   HGBA1C 5.8 (H) 11/25/2017   MPG 119.76 11/25/2017   No results found for: PROLACTIN Lab Results  Component Value Date   CHOL 97  01/06/2017   TRIG 114 01/06/2017   HDL 39 (L) 01/06/2017   CHOLHDL 2.5 01/06/2017   VLDL 23 01/06/2017   LDLCALC 35 01/06/2017   LDLCALC 43 03/26/2016    Physical Findings: AIMS: Facial and Oral Movements Muscles of Facial Expression: None, normal Lips and Perioral Area: None, normal Jaw: None, normal Tongue: None, normal,Extremity Movements Upper (arms, wrists, hands, fingers): None, normal Lower (legs, knees, ankles, toes): None, normal, Trunk Movements Neck, shoulders, hips: None, normal, Overall Severity Severity of abnormal movements (highest score from questions above): None, normal Incapacitation due to abnormal movements: None, normal Patient's awareness of abnormal movements (rate only patient's report): No Awareness, Dental Status Current problems with teeth and/or dentures?: No Does patient usually wear dentures?: No  CIWA:  CIWA-Ar Total: 2 COWS:  COWS Total Score: 1  Musculoskeletal: Strength & Muscle Tone: within normal limits Gait & Station: normal  Psychiatric Specialty Exam: Physical Exam no acute findings little bit hypothermic probably due to the antipsychotic treatment  ROS noncompliance with HIV meds  Blood pressure 126/83, pulse 70, temperature (!) 97.5 F (36.4 C), temperature source Oral, resp. rate 18, height 5' 11.5" (1.816 m), weight 64 kg.Body mass index is 19.39 kg/m.  General Appearance: Casual  Eye Contact:  Fair  Speech:  Blocked  Volume:  Decreased  Mood:  Anxious and Dysphoric  Affect:  Restricted  Thought Process:  Disorganized  Orientation:  Other:  Person general place not month  Thought Content:  Tangential  Suicidal Thoughts:  No  Homicidal Thoughts:  No  Memory:  Immediate;   Poor  Judgement:  Impaired  Insight:  Fair  Psychomotor Activity:  Decreased  Concentration:  Concentration: Poor  Recall:  Poor  Fund of Knowledge:  Poor  Language:  Poor no longer having echolalia  Akathisia:  Negative  Handed:  Right  AIMS (if  indicated):     Assets:  Physical  Health  ADL's:  Intact  Cognition:  Impaired,  Moderate  Sleep:  Number of Hours: 6.75   As per team discussions, the question now is what exactly are we treating.  This individual presented with intense depression in the context of cocaine binging and abuse of cold medication in a binge type fashion as well. 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 given the cognitive impairment that persists, and reports of noncompliance with HIV medications prior to admission Last MRI of the brain 2017 in June was normal  Again a long history of polysubstance abuse and dependence, probable personal disorder and history of even conversion disorder pseudoseizures At present Also there is comorbid depression, anxiety and significant psychosocial stressors    We will continue neuro protective measures, will replace Risperdal and Cogentin which have not been helpful with olanzapine at bedtime, continue HIV meds continue reality based therapy and cognitive based therapy  Treatment Plan Summary: Daily contact with patient to assess and evaluate symptoms and progress in treatment and Medication management  Malvin Johns, MD 10/07/2018, 9:06 AM

## 2018-10-07 NOTE — Progress Notes (Signed)
Recreation Therapy Notes  Date: 12.20.19 Time: 1000 Location: 500 Hall Dayroom  Group Topic: Triggers  Goal Area(s) Addresses:  Patient will identify triggers. Patient will identify strategies to avoid triggers. Patient will identify strategies to deal with triggers head on.   Intervention: Worksheet  Activity: Triggers.  Patients were to identify the things that trigger them, how they can avoid their triggers and how they can face their triggers head on.  Education: Communication, Discharge Planning  Education Outcome: Acknowledges understanding/In group clarification offered/Needs additional education.   Clinical Observations/Feedback: Pt did not attend group.     Sosaia Pittinger, LRT/CTRS         [place] A 10/07/2018 11:12 AM 

## 2018-10-07 NOTE — Progress Notes (Signed)
D: Pt denies SI/HI/AVH. Pt is pleasant and cooperative. Pt stated he was feeling sedated due to the medications. Pt visible on the unit .  A: Pt was offered support and encouragement. Pt was given scheduled medications. Pt was encourage to attend groups. Q 15 minute checks were done for safety.  R:Pt attends groups and interacts well with peers and staff. Pt is taking medication. Pt has no complaints.Pt receptive to treatment and safety maintained on unit.  Problem: Coping: Goal: Coping ability will improve Outcome: Progressing   Problem: Medication: Goal: Compliance with prescribed medication regimen will improve Outcome: Progressing

## 2018-10-07 NOTE — Progress Notes (Signed)
Patient ID: Pedro Owens, male   DOB: Apr 15, 1991, 27 y.o.   MRN: [id number]  Pt currently presents with a labile affect and implusive behavior. Pt reports to writer that "I sleep a lot all day." Pt reports good sleep with current medication regimen. Pt forwards little to writer, remains in bed for most of the day.   Pt provided with medications per providers orders. Pt's labs and vitals were monitored throughout the night. Pt supported emotionally and encouraged to express concerns and questions. Pt educated on medications.  Pt's safety ensured with 15 minute and environmental checks. Pt currently denies SI/HI and A/V hallucinations although admission assessment says otherwise. Pt verbally agrees to seek staff if SI/HI or A/VH occurs and to consult with staff before acting on any harmful thoughts. Will continue POC.

## 2018-10-08 DIAGNOSIS — F1410 Cocaine abuse, uncomplicated: Secondary | ICD-10-CM | POA: Diagnosis present

## 2018-10-08 MED ORDER — FLUOXETINE HCL 10 MG PO CAPS
10.0000 mg | ORAL_CAPSULE | Freq: Every day | ORAL | Status: DC
  Administered 2018-10-08 – 2018-10-11 (×4): 10 mg via ORAL
  Filled 2018-10-08 (×7): qty 1

## 2018-10-08 MED ORDER — HYDROXYZINE HCL 10 MG PO TABS
10.0000 mg | ORAL_TABLET | Freq: Three times a day (TID) | ORAL | Status: DC | PRN
  Administered 2018-10-10: 10 mg via ORAL
  Filled 2018-10-08: qty 1
  Filled 2018-10-08: qty 10

## 2018-10-08 MED ORDER — TEMAZEPAM 15 MG PO CAPS: 15.0000 mg | ORAL_CAPSULE | Freq: Every day | ORAL | Status: DC

## 2018-10-08 MED ORDER — CLONAZEPAM 0.5 MG PO TABS: 0.2500 mg | ORAL_TABLET | Freq: Two times a day (BID) | ORAL | Status: DC

## 2018-10-08 MED ORDER — CLONAZEPAM 0.5 MG PO TABS: 0.5000 mg | ORAL_TABLET | Freq: Two times a day (BID) | ORAL | Status: DC

## 2018-10-08 NOTE — Progress Notes (Signed)
Patient reports that he no longer feels nauseous- but still doesn't feel well. VS obtained. BP 128/83, HR 108, Temp 98.5. Pt provided with personal pitcher of Gatorade and encouraged to drink. Pt resting in bed at this time.
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
## 2018-10-08 NOTE — [facility] Group Notes (Signed)
Adult Psychoeducational Group Note  Date:  10/08/2018 Time:  4:24 AM  Group Topic/Focus:  Wrap-Up Group:   The focus of this group is to help patients review their daily goal of treatment and discuss progress on daily workbooks.  Participation Level:  Active  Participation Quality:  Appropriate and Attentive  Affect:  Appropriate  Cognitive:  Alert and Appropriate  Insight: Appropriate and Good  Engagement in Group:  Engaged  Modes of Intervention:  Discussion and Education  Additional Comments:  Pt attended and participated in wrap up group this evening. Pt rated their day a 7/10, due to them sleeping and resting a lot. Pt did not complete their goal, which was to stay awake and go to groups.   Chrisandra Netters 10/08/2018, 4:24 AM

## 2018-10-08 NOTE — Progress Notes (Signed)
D. Pt presents with an anxious affect and somewhat labile behavior-has been calm and cooperative, polite during interactions- childlike in speech. Pt observed in the dayroom throughout the shift- minimal interaction with peers. Pt currently denies SI/HI and AVH A. Labs and vitals monitored. Pt compliant with medications. Pt supported emotionally and encouraged to express concerns and ask questions.   R. Pt remains safe with 15 minute checks. Will continue POC.
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
## 2018-10-08 NOTE — Progress Notes (Signed)
Patient returned from dinner early complaining of nausea. Staff walked with patient to his room where he proceeded to throw up his dinner.

## 2018-10-08 NOTE — [facility] Suicide Risk Assessment (Addendum)
[facility] INPATIENT:  Family/Significant Other Suicide Prevention Education  Suicide Prevention Education:  Contact Attempts: partner Crist Fat [phone] , (name of family member/significant other) has been identified by the patient as the family member/significant other with whom the patient will be residing, and identified as the person(s) who will aid the patient in the event of a mental health crisis.  With written consent from the patient, two attempts were made to provide suicide prevention education, prior to and/or following the patient's discharge.  We were unsuccessful in providing suicide prevention education.  A suicide education pamphlet was given to the patient to share with family/significant other.  Date and time of first attempt: 10/04/18  Date and time of second attempt: 10/08/18 at 3:18 PM  Shellia Cleverly 10/08/2018, 3:19 PM   Contact Attempts: mother  Ellison Hughs [phone], (name of family member/significant other) has been identified by the patient as the family member/significant other with whom the patient will be residing, and identified as the person(s) who will aid the patient in the event of a mental health crisis.  With written consent from the patient, two attempts were made to provide suicide prevention education, prior to and/or following the patient's discharge.  We were unsuccessful in providing suicide prevention education.  A suicide education pamphlet was given to the patient to share with family/significant other.  Date and time of first attempt: 10/08/18 at 3:20 PM - call would not go through Date and time of second attempt: 10/09/18 at 3:59 PM - call could not be completed "as dialed"  Ambrose Mantle, LCSW 10/09/2018, 4:01 PM

## 2018-10-08 NOTE — Progress Notes (Signed)
Patient ID: Pedro Owens, male   DOB: 18-Jul-1991, 27 y.o.   MRN: [id number]   [facility] MD Progress Note  10/08/2018 9:00 AM THAYER EMBLETON  MRN:  [id number] Subjective:  Patient is pleasant on assessment, sleeping earlier this morning.  Reports he slept well last night but not at home.  Patient complains of being dizzy, fluids encouraged and rising slowly.  Mood is "good", denies depression or withdrawal symptoms.  Medications adjusted to prevent his symptoms:  Discontinued Restoril, decreased Klonopin 0.5 mg TID to 0.25 mg BID, decreased hydroxyzine 25 mg TID PRN anxiety to 10 mg.  Mother called and concerned because he felt "sad" last night, denies this feeling today.  Principal Problem: Presenting with depression and suicidality now presenting with more of a cluster of symptoms Diagnosis: Principal Problem:   Severe recurrent major depression w/psychotic features, mood-congruent (HCC) Active Problems:   Cocaine abuse (HCC)  Total Time spent with patient: 30 minutes  Past Psychiatric History: neg  Past Medical History:  Past Medical History:  Diagnosis Date  . ADHD (attention deficit hyperactivity disorder)   . Anal warts   . Anxiety   . Asthma   . Bipolar disorder (HCC)   . Chronic bronchitis (HCC)   . Chronic pain syndrome   . Convulsions (HCC) 08/14/2015  . Depression   . Gonorrhea 09/08/11  . Headache    "weekly" (11/06/2016)  . Hemorrhoids, internal, with bleeding 08/25/2011  . Hepatitis C   . Herpes   . HIV infection (HCC)   . Hypertension   . ILEITIS 09/24/2010   Qualifier: Diagnosis of  By: Nelson-Smith CMA (AAMA), Dottie    . Lymphoid hyperplasia, reactive   . Marijuana abuse   . Migraine    "monthly" (11/06/2016)  . Pseudoseizures   . Seizures (HCC)    "don't know why I have them" (11/06/2016)  . Syphilis     Past Surgical History:  Procedure Laterality Date  . ANKLE SURGERY Bilateral 2005   Screw & Pins  . ANKLE SURGERY Right 2008   "replaced pins & screws"   . FOOT SURGERY Bilateral 2005   "bone spurs"  . I&D EXTREMITY Right 06/26/2016   Procedure: IRRIGATION AND DEBRIDEMENT RIGHT FOREARM;  Surgeon: Betha Loa, MD;  Location: [facility] OR;  Service: Orthopedics;  Laterality: Right;  . I&D EXTREMITY Right 11/04/2016   Procedure: IRRIGATION AND DEBRIDEMENT EXTREMITY;  Surgeon: Dairl Ponder, MD;  Location: [facility] OR;  Service: Orthopedics;  Laterality: Right;  . I&D EXTREMITY Right 11/06/2016   Procedure: IRRIGATION AND DEBRIDEMENT right forarm;  Surgeon: Dairl Ponder, MD;  Location: [facility] OR;  Service: Orthopedics;  Laterality: Right;  . TEE WITHOUT CARDIOVERSION N/A 11/19/2017   Procedure: TRANSESOPHAGEAL ECHOCARDIOGRAM (TEE);  Surgeon: Pricilla Riffle, MD;  Location: [facility] ENDOSCOPY;  Service: Cardiovascular;  Laterality: N/A;  . VIDEO ASSISTED THORACOSCOPY (VATS)/EMPYEMA Left 11/26/2017   Procedure: VIDEO ASSISTED THORACOSCOPY (VATS)/DRAIN EMPYEMA;  Surgeon: Kerin Perna, MD;  Location: [facility] OR;  Service: Thoracic;  Laterality: Left;   Family History:  Family History  Problem Relation Age of Onset  . Diabetes Mother   . Irritable bowel syndrome Mother   . Hypertension Mother   . Hyperlipidemia Mother   . Hypothyroidism Mother   . Diabetes Maternal Uncle   . Heart disease Maternal Grandmother   . Leukemia Other        maternal greatgrandmother  . Colon cancer Neg Hx    Family Psychiatric  History: neg Social History:  Social History  Substance and Sexual Activity  Alcohol Use Yes   Comment: 11/06/2016 "nothing in a very long time"     Social History   Substance and Sexual Activity  Drug Use Yes  . Frequency: 2.0 times per week  . Types: Cocaine, Benzodiazepines, Oxycodone, Marijuana, Heroin   Comment: yesterday    Social History   Socioeconomic History  . Marital status: Single    Spouse name: Not on file  . Number of children: 0  . Years of education: Not on file  . Highest education level: Not on file  Occupational History  .  Occupation: Production designer, theatre/television/film  Social Needs  . Financial resource strain: Not on file  . Food insecurity:    Worry: Not on file    Inability: Not on file  . Transportation needs:    Medical: Not on file    Non-medical: Not on file  Tobacco Use  . Smoking status: Current Every Day Smoker    Packs/day: 1.50    Years: 11.00    Pack years: 16.50    Types: Cigarettes  . Smokeless tobacco: Never Used  Substance and Sexual Activity  . Alcohol use: Yes    Comment: 11/06/2016 "nothing in a very long time"  . Drug use: Yes    Frequency: 2.0 times per week    Types: Cocaine, Benzodiazepines, Oxycodone, Marijuana, Heroin    Comment: yesterday  . Sexual activity: Not Currently    Partners: Male    Birth control/protection: Condom  Lifestyle  . Physical activity:    Days per week: Not on file    Minutes per session: Not on file  . Stress: Not on file  Relationships  . Social connections:    Talks on phone: Not on file    Gets together: Not on file    Attends religious service: Not on file    Active member of club or organization: Not on file    Attends meetings of clubs or organizations: Not on file    Relationship status: Not on file  Other Topics Concern  . Not on file  Social History Narrative   ** Merged History Encounter **       Sleep: Fair  Appetite:  Fair  Current Medications: Current Facility-Administered Medications  Medication Dose Route Frequency Provider Last Rate Last Dose  . abacavir-dolutegravir-lamiVUDine (TRIUMEQ) 600-50-300 MG per tablet 1 tablet  1 tablet Oral Daily Nira Conn A, NP   1 tablet at 10/08/18 0818  . alum & mag hydroxide-simeth (MAALOX/MYLANTA) 200-200-20 MG/5ML suspension 30 mL  30 mL Oral Q4H PRN Nira Conn A, NP      . clonazePAM Scarlette Calico) tablet 0.5 mg  0.5 mg Oral TID Malvin Johns, MD   0.5 mg at 10/08/18 0818  . feeding supplement (ENSURE ENLIVE) (ENSURE ENLIVE) liquid 237 mL  237 mL Oral BID BM Malvin Johns, MD   237 mL at 10/08/18 1610  .  hydrOXYzine (ATARAX/VISTARIL) tablet 25 mg  25 mg Oral TID PRN Jackelyn Poling, NP      . ibuprofen (ADVIL,MOTRIN) tablet 600 mg  600 mg Oral Q6H PRN Donell Sievert E, PA-C      . magnesium hydroxide (MILK OF MAGNESIA) suspension 30 mL  30 mL Oral Daily PRN Nira Conn A, NP      . nicotine (NICODERM CQ - dosed in mg/24 hours) patch 21 mg  21 mg Transdermal Daily Nira Conn A, NP   21 mg at 10/08/18 0819  . OLANZapine (ZYPREXA) tablet 10  mg  10 mg Oral QHS Malvin JohnsFarah, Brian, MD   10 mg at 10/07/18 2105  . omega-3 acid ethyl esters (LOVAZA) capsule 1 g  1 g Oral BID Malvin JohnsFarah, Brian, MD   1 g at 10/08/18 0818  . prenatal multivitamin tablet 1 tablet  1 tablet Oral Q1200 Malvin JohnsFarah, Brian, MD   1 tablet at 10/07/18 1155  . temazepam (RESTORIL) capsule 30 mg  30 mg Oral QHS Malvin JohnsFarah, Brian, MD   30 mg at 10/07/18 2105    Lab Results: No results found for this or any previous visit (from the past 48 hour(s)).  Blood Alcohol level:  Lab Results  Component Value Date   ETH <10 10/03/2018   ETH <10 09/24/2018    Metabolic Disorder Labs: Lab Results  Component Value Date   HGBA1C 5.8 (H) 11/25/2017   MPG 119.76 11/25/2017   No results found for: PROLACTIN Lab Results  Component Value Date   CHOL 97 01/06/2017   TRIG 114 01/06/2017   HDL 39 (L) 01/06/2017   CHOLHDL 2.5 01/06/2017   VLDL 23 01/06/2017   LDLCALC 35 01/06/2017   LDLCALC 43 03/26/2016    Physical Findings: AIMS: Facial and Oral Movements Muscles of Facial Expression: None, normal Lips and Perioral Area: None, normal Jaw: None, normal Tongue: None, normal,Extremity Movements Upper (arms, wrists, hands, fingers): None, normal Lower (legs, knees, ankles, toes): None, normal, Trunk Movements Neck, shoulders, hips: None, normal, Overall Severity Severity of abnormal movements (highest score from questions above): None, normal Incapacitation due to abnormal movements: None, normal Patient's awareness of abnormal movements (rate only  patient's report): No Awareness, Dental Status Current problems with teeth and/or dentures?: No Does patient usually wear dentures?: No  CIWA:  CIWA-Ar Total: 2 COWS:  COWS Total Score: 1  Musculoskeletal: Strength & Muscle Tone: within normal limits Gait & Station: normal  Psychiatric Specialty Exam: Physical Exam  Nursing note and vitals reviewed. Constitutional: He is oriented to person, place, and time. He appears well-developed and well-nourished.  HENT:  Head: Normocephalic.  Neck: Normal range of motion.  Respiratory: Effort normal.  Musculoskeletal: Normal range of motion.  Neurological: He is alert and oriented to person, place, and time.  Psychiatric: His speech is normal and behavior is normal. Thought content normal. His affect is blunt. Cognition and memory are impaired. He expresses impulsivity. He exhibits a depressed mood.   no acute findings little bit hypothermic probably due to the antipsychotic treatment  Review of Systems  Psychiatric/Behavioral: Positive for depression and substance abuse.  All other systems reviewed and are negative.  noncompliance with HIV meds  Blood pressure 129/80, pulse (!) 109, temperature 97.8 F (36.6 C), temperature source Oral, resp. rate 16, height 5' 11.5" (1.816 m), weight 64 kg.Body mass index is 19.39 kg/m.  General Appearance: Casual  Eye Contact:  Fair  Speech:  Normal  Volume:  Decreased  Mood:  Anxious and Dysphoric  Affect:  Restricted  Thought Process:  Clear and coherent  Orientation:  Other:  Person general place not month  Thought Content:  Coherent  Suicidal Thoughts:  No  Homicidal Thoughts:  No  Memory:  Fair  Judgement:  Poor  Insight:  Fair  Psychomotor Activity:  Decreased  Concentration:  Fair  Recall:  [employer]ervFair  Fund of Knowledge:  Fair  Language: Fair  Akathisia:  Negative  Handed:  Right  AIMS (if indicated):     Assets:  Physical Health  ADL's:  Intact  Cognition:  Impaired,  Moderate  Sleep:   Number of Hours: 6.75   Treatment Plan Summary: Daily contact with patient to assess and evaluate symptoms and progress in treatment and Medication management:  Major depressive disorder, recurrent, severe with psychosis: -Start Prozac 10 mg daily for depression  Anxiety: -Decreased Klonopin 0.5 mg TID to 0.25 mg BID due to dizziness -Decreased hydroxyzine 25 mg TID PRN anxiety to 10 mg  Nanine Means, NP 10/08/2018, 9:00 AM

## 2018-10-08 NOTE — Progress Notes (Signed)
Did not attend group 

## 2018-10-08 NOTE — [facility] Group Notes (Addendum)
  [facility] LCSW Group Therapy Note  Date/Time:  10/08/2018 11:15AM-12:00PM  Type of Therapy and Topic:  Group Therapy:  Feelings About Hospitalization  Participation Level:  Active   Description of Group This process group involved patients discussing their feelings related to being hospitalized, as well as the benefits they see to being in the hospital.  These feelings and benefits were itemized.  The group then brainstormed specific ways in which they could seek those same benefits when they discharge and return home.  Therapeutic Goals 1. Patient will identify and describe positive and negative feelings related to hospitalization 2. Patient will verbalize benefits of hospitalization to themselves personally 3. Patients will brainstorm together ways they can obtain similar benefits in the outpatient setting, identify barriers to wellness and possible solutions  Summary of Patient Progress:  The patient expressed his primary feelings about being hospitalized are good and he recognizes the benefit of being admitted to inpatient at this time.  Therapeutic Modalities Cognitive Behavioral Therapy Motivational Interviewing    Henrene Dodgeonnie Jerry Clyne, Kentucky[place]

## 2018-10-09 DIAGNOSIS — F1210 Cannabis abuse, uncomplicated: Secondary | ICD-10-CM

## 2018-10-09 MED ORDER — LORATADINE 10 MG PO TABS
10.0000 mg | ORAL_TABLET | Freq: Every day | ORAL | Status: DC
  Administered 2018-10-09 – 2018-10-11 (×3): 10 mg via ORAL
  Filled 2018-10-09 (×5): qty 1

## 2018-10-09 NOTE — Plan of Care (Signed)
Progress note  D: pt found in bed; allowed to rest. Pt compliant with medication administration upon awakening. Pt states he slept well. Pt rates his depression/hopelessness/anxiety a 2/0/0 out of 10 respectively. Pt has complaints of cravings, a runny nose and a stomach ache. Pt denies any physical pain though, rating this a 0/10. Pt states his goal for today is to go to group and he will achieve this by staying awake. Pt denies any si/hi/ah/vh and verbally agrees to approach staff if these become apparent or before harming himself while at [facility]. A: pt provided support and encouragement. Pt given medication per protocol and standing orders. Q40m safety checks implemented and continued.  R: pt safe on the unit. Will continue to monitor.   Pt progressing in the following metrics  Problem: Health Behavior/Discharge Planning: Goal: Identification of resources available to assist in meeting health care needs will improve Outcome: Progressing   Problem: Self-Concept: Goal: Ability to disclose and discuss suicidal ideas will improve Outcome: Progressing Goal: Will verbalize positive feelings about self Outcome: Progressing   Problem: Education: Goal: Knowledge of [facility] Education information/materials will improve Outcome: Progressing

## 2018-10-09 NOTE — Progress Notes (Signed)
Adult Psychoeducational Group Note  Date:  10/09/2018 Time:  8:58 PM  Group Topic/Focus:  Wrap-Up Group:   The focus of this group is to help patients review their daily goal of treatment and discuss progress on daily workbooks.  Participation Level:  Active  Participation Quality:  Appropriate  Affect:  Appropriate  Cognitive:  Alert  Insight: Appropriate  Engagement in Group:  Engaged  Modes of Intervention:  Discussion  Additional Comments:  Patient stated he had a good day. Patient's goal for today was to attend group. Patient stated he met his goal.  Pedro Owens 10/09/2018, 8:58 PM

## 2018-10-09 NOTE — Progress Notes (Signed)
Patient ID: Pedro Owens, male   DOB: 1991-03-15, 27 y.o.   MRN: [id number]   [facility]  10/09/2018 10:05 AM Pedro Owens  MRN:  [id number] Subjective: I feel fine. My throat hurt a little. I have a runny nose and sore throat.   Objective:  Patient is a 27 year old male who presented to the ED with worsening depression, polysubstance abuse, and self reported attempt of overdose on dextromorphan. He is alert and oriented x 3, calm and cooperative at this time. He is engaging well with his peers and staff, has not observed any disrputive behhvaiors or suicidal thoughts since his admission to the hospital. He is able to offer insight to his behaviors that lead to the hospital admission. He rports his goal is to attend all groups. He continues on  Klonopin 0.5 mg TID to 0.25 mg BID, hydroxyzine 25 mg TID PRN anxiety to 10 mg.  He rpeort some sore throat symptoms and runny nose, may be result of oversue of medications dextromorphan and "triple C" or rebound congestion. Denies suicidal thoughts, homicidal thoughts, and hallucinations. He is able to contract for safety while on the unit.   Principal Problem: Presenting with depression and suicidality now presenting with more of a cluster of symptoms Diagnosis: Principal Problem:   Severe recurrent major depression w/psychotic features, mood-congruent (HCC) Active Problems:   Cocaine abuse (HCC)  Total Time spent with patient: 20 minutes  Past Psychiatric History: neg  Past Medical History:  Past Medical History:  Diagnosis Date  . ADHD (attention deficit hyperactivity disorder)   . Anal warts   . Anxiety   . Asthma   . Bipolar disorder (HCC)   . Chronic bronchitis (HCC)   . Chronic pain syndrome   . Convulsions (HCC) 08/14/2015  . Depression   . Gonorrhea 09/08/11  . Headache    "weekly" (11/06/2016)  . Hemorrhoids, internal, with bleeding 08/25/2011  . Hepatitis C   . Herpes   . HIV infection (HCC)   . Hypertension   .  ILEITIS 09/24/2010   Qualifier: Diagnosis of  By: Nelson-Smith CMA (AAMA), Dottie    . Lymphoid hyperplasia, reactive   . Marijuana abuse   . Migraine    "monthly" (11/06/2016)  . Pseudoseizures   . Seizures (HCC)    "don't know why I have them" (11/06/2016)  . Syphilis     Past Surgical History:  Procedure Laterality Date  . ANKLE SURGERY Bilateral 2005   Screw & Pins  . ANKLE SURGERY Right 2008   "replaced pins & screws"  . FOOT SURGERY Bilateral 2005   "bone spurs"  . I&D EXTREMITY Right 06/26/2016   Procedure: IRRIGATION AND DEBRIDEMENT RIGHT FOREARM;  Surgeon: Betha LoaKevin Kuzma, MD;  Location: [facility] OR;  Service: Orthopedics;  Laterality: Right;  . I&D EXTREMITY Right 11/04/2016   Procedure: IRRIGATION AND DEBRIDEMENT EXTREMITY;  Surgeon: Dairl PonderMatthew Weingold, MD;  Location: [facility] OR;  Service: Orthopedics;  Laterality: Right;  . I&D EXTREMITY Right 11/06/2016   Procedure: IRRIGATION AND DEBRIDEMENT right forarm;  Surgeon: Dairl PonderMatthew Weingold, MD;  Location: [facility] OR;  Service: Orthopedics;  Laterality: Right;  . TEE WITHOUT CARDIOVERSION N/A 11/19/2017   Procedure: TRANSESOPHAGEAL ECHOCARDIOGRAM (TEE);  Surgeon: Pricilla Riffleoss, Paula V, MD;  [facility];  Service: Cardiovascular;  Laterality: N/A;  . VIDEO ASSISTED THORACOSCOPY (VATS)/EMPYEMA Left 11/26/2017   Procedure: VIDEO ASSISTED THORACOSCOPY (VATS)/DRAIN EMPYEMA;  Surgeon: Kerin PernaVan Trigt, Peter, MD;  [facility];  Service: Thoracic;  Laterality: Left;  Family History:  Family History  Problem Relation Age of Onset  . Diabetes Mother   . Irritable bowel syndrome Mother   . Hypertension Mother   . Hyperlipidemia Mother   . Hypothyroidism Mother   . Diabetes Maternal Uncle   . Heart disease Maternal Grandmother   . Leukemia Other        maternal greatgrandmother  . Colon cancer Neg Hx    Family Psychiatric  History: neg Social History:  Social History   Substance and Sexual Activity  Alcohol Use Yes   Comment: 11/06/2016 "nothing in a very  long time"     Social History   Substance and Sexual Activity  Drug Use Yes  . Frequency: 2.0 times per week  . Types: Cocaine, Benzodiazepines, Oxycodone, Marijuana, Heroin   Comment: yesterday    Social History   Socioeconomic History  . Marital status: Single    Spouse name: Not on file  . Number of children: 0  . Years of education: Not on file  . Highest education level: Not on file  Occupational History  . Occupation: Production designer, theatre/television/film  Social Needs  . Financial resource strain: Not on file  . Food insecurity:    Worry: Not on file    Inability: Not on file  . Transportation needs:    Medical: Not on file    Non-medical: Not on file  Tobacco Use  . Smoking status: Current Every Day Smoker    Packs/day: 1.50    Years: 11.00    Pack years: 16.50    Types: Cigarettes  . Smokeless tobacco: Never Used  Substance and Sexual Activity  . Alcohol use: Yes    Comment: 11/06/2016 "nothing in a very long time"  . Drug use: Yes    Frequency: 2.0 times per week    Types: Cocaine, Benzodiazepines, Oxycodone, Marijuana, Heroin    Comment: yesterday  . Sexual activity: Not Currently    Partners: Male    Birth control/protection: Condom  Lifestyle  . Physical activity:    Days per week: Not on file    Minutes per session: Not on file  . Stress: Not on file  Relationships  . Social connections:    Talks on phone: Not on file    Gets together: Not on file    Attends religious service: Not on file    Active member of club or organization: Not on file    Attends meetings of clubs or organizations: Not on file    Relationship status: Not on file  Other Topics Concern  . Not on file  Social History Narrative   ** Merged History Encounter **       Sleep: Fair  Appetite:  Fair  Current Medications: Current Facility-Administered Medications  Medication Dose Route Frequency Provider Last Rate Last Dose  . abacavir-dolutegravir-lamiVUDine (TRIUMEQ) 600-50-300 MG per tablet 1  tablet  1 tablet Oral Daily Nira Conn A, NP   1 tablet at 10/09/18 4098  . alum & mag hydroxide-simeth (MAALOX/MYLANTA) 200-200-20 MG/5ML suspension 30 mL  30 mL Oral Q4H PRN Nira Conn A, NP      . feeding supplement (ENSURE ENLIVE) (ENSURE ENLIVE) liquid 237 mL  237 mL Oral BID BM Malvin Johns, MD   237 mL at 10/08/18 1324  . FLUoxetine (PROZAC) capsule 10 mg  10 mg Oral Daily Charm Rings, NP   10 mg at 10/09/18 1191  . hydrOXYzine (ATARAX/VISTARIL) tablet 10 mg  10 mg Oral TID PRN Nanine Means  Y, NP      . ibuprofen (ADVIL,MOTRIN) tablet 600 mg  600 mg Oral Q6H PRN Kerry Hough, PA-C   600 mg at 10/08/18 1025  . magnesium hydroxide (MILK OF MAGNESIA) suspension 30 mL  30 mL Oral Daily PRN Nira Conn A, NP      . nicotine (NICODERM CQ - dosed in mg/24 hours) patch 21 mg  21 mg Transdermal Daily Nira Conn A, NP   21 mg at 10/09/18 0723  . OLANZapine (ZYPREXA) tablet 10 mg  10 mg Oral QHS Malvin Johns, MD   10 mg at 10/08/18 2057  . omega-3 acid ethyl esters (LOVAZA) capsule 1 g  1 g Oral BID Malvin Johns, MD   1 g at 10/09/18 9604  . prenatal multivitamin tablet 1 tablet  1 tablet Oral Q1200 Malvin Johns, MD   1 tablet at 10/07/18 1155    Lab Results: No results found for this or any previous visit (from the past 48 hour(s)).  Blood Alcohol level:  Lab Results  Component Value Date   ETH <10 10/03/2018   ETH <10 09/24/2018    Metabolic Disorder Labs: Lab Results  Component Value Date   HGBA1C 5.8 (H) 11/25/2017   MPG 119.76 11/25/2017   No results found for: PROLACTIN Lab Results  Component Value Date   CHOL 97 01/06/2017   TRIG 114 01/06/2017   HDL 39 (L) 01/06/2017   CHOLHDL 2.5 01/06/2017   VLDL 23 01/06/2017   LDLCALC 35 01/06/2017   LDLCALC 43 03/26/2016    Physical Findings: AIMS: Facial and Oral Movements Muscles of Facial Expression: None, normal Lips and Perioral Area: None, normal Jaw: None, normal Tongue: None, normal,Extremity  Movements Upper (arms, wrists, hands, fingers): None, normal Lower (legs, knees, ankles, toes): None, normal, Trunk Movements Neck, shoulders, hips: None, normal, Overall Severity Severity of abnormal movements (highest score from questions above): None, normal Incapacitation due to abnormal movements: None, normal Patient's awareness of abnormal movements (rate only patient's report): No Awareness, Dental Status Current problems with teeth and/or dentures?: No Does patient usually wear dentures?: No  CIWA:  CIWA-Ar Total: 2 COWS:  COWS Total Score: 1  Musculoskeletal: Strength & Muscle Tone: within normal limits Gait & Station: normal  Psychiatric Specialty Exam: Physical Exam  Nursing note and vitals reviewed. Constitutional: He is oriented to person, place, and time. He appears well-developed and well-nourished.  HENT:  Head: Normocephalic.  Neck: Normal range of motion.  Respiratory: Effort normal.  Musculoskeletal: Normal range of motion.  Neurological: He is alert and oriented to person, place, and time.  Psychiatric: His speech is normal and behavior is normal. Thought content normal. His affect is blunt. Cognition and memory are impaired. He expresses impulsivity. He exhibits a depressed mood.   no acute findings little bit hypothermic probably due to the antipsychotic treatment  Review of Systems  Psychiatric/Behavioral: Positive for depression and substance abuse.  All other systems reviewed and are negative.  noncompliance with HIV meds  Blood pressure (!) 112/91, pulse (!) 114, temperature 97.9 F (36.6 C), temperature source Oral, resp. rate 18, height 5' 11.5" (1.816 m), weight 64 kg.Body mass index is 19.39 kg/m.  General Appearance: Casual  Eye Contact:  Fair  Speech:  Normal  Volume:  Normal  Mood:  Anxious and Depressed  Affect:  Depressed and Restricted  Thought Process:  Clear and coherent  Orientation:  Full (Time, Place, and Person)  Thought Content:   Coherent  Suicidal Thoughts:  No  Homicidal Thoughts:  No  Memory:  Fair  Judgement:  Poor  Insight:  Fair  Psychomotor Activity:  Normal  Concentration:  Fair  Recall:  [employer] of Knowledge:  Fair  Language: Fair  Akathisia:  No  Handed:  Right  AIMS (if indicated):     Assets:  Physical Health  ADL's:  Intact  Cognition:  Impaired,  Moderate  Sleep:  Number of Hours: 6.5   Treatment Plan Summary: Daily contact with patient to assess and evaluate symptoms and progress in treatment and Medication management:  Major depressive disorder, recurrent, severe with psychosis: -Start Prozac 10 mg daily for depression  Anxiety: -Continue Klonopin 0.5 mg TID to 0.25 mg BID due to dizziness -Continue  hydroxyzine 25 mg TID PRN anxiety to 10 mg  Nasal congestion: WIll start cetrizine 10mg  po daily.  Will avoid dextromorphan products and those that contain pseudoephedrine. Suspect some rebound congestion as a result of overuse OTC medications and recent overdose.   Maryagnes Amos, FNP 10/09/2018, 10:05 AM

## 2018-10-09 NOTE — [facility] Group Notes (Signed)
[facility]  Date/Time:  12/22//2019 11:00AM  Type of Therapy and Topic:  Group Therapy:  Healthy and Unhealthy Supports  Participation Level:  Active   Description of Group:  Patients in this group were introduced to the idea of adding a variety of healthy supports to address the various needs in their lives.Patients discussed what additional healthy supports could be helpful in their recovery and wellness after discharge in order to prevent future hospitalizations.   An emphasis was placed on using counselor, doctor, therapy groups, 12-step groups, and problem-specific support groups to expand supports.  They also worked as a group on developing a specific plan for several patients to deal with unhealthy supports through boundary-setting, psychoeducation with loved ones, and even termination of relationships.   Therapeutic Goals:   1)  discuss importance of adding supports to stay well once out of the hospital  2)  compare healthy versus unhealthy supports and identify some examples of each  3)  generate ideas and descriptions of healthy supports that can be added  4)  offer mutual support about how to address unhealthy supports  5)  encourage active participation in and adherence to discharge plan    Summary of Patient Progress:  The patient stated that current healthy supports in his life are family while current unhealthy supports include include a friend who he has used drugs with.  The patient expressed a willingness to use work as a source of support to help in his recovery journey.   Therapeutic Modalities:   Motivational Interviewing Brief Solution-Focused Therapy  [name] [name]

## 2018-10-09 NOTE — Progress Notes (Signed)
D:  Phu has been isolative to his room this evening.  He did get up for group and to get a snack.  He denied SI/HI or A/V hallucinations.  He reported that he has had trouble with his stomach all day.  He reported having BM and has been eating well.  He reported being able to eat his snack tonight.  He appears to be in no physical distress.  He took his hs medications without difficulty.  He is currently resting with his eyes closed and appears to be asleep. A:  1:1 with RN for support and encouragement.  Medications as ordered.  Q 15 minute checks maintained for safety.  Encouraged participation in group and unit activities.   R:  Jessy remains safe on the unit.  We will continue to monitor the progress towards his goals.

## 2018-10-09 NOTE — Plan of Care (Signed)
  Problem: Medication: Goal: Compliance with prescribed medication regimen will improve Outcome: Progressing   Problem: Education: Goal: Emotional status will improve Outcome: Progressing   

## 2018-10-10 NOTE — Tx Team (Signed)
Interdisciplinary Treatment and Diagnostic Plan Update  10/10/2018 Time of Session: 9:00am Pedro Owens MRN: [id number]  Principal Diagnosis: Severe recurrent major depression w/psychotic features, mood-congruent [facility](HCC)  Secondary Diagnoses: Principal Problem:   Severe recurrent major depression w/psychotic features, mood-congruent (HCC) Active Problems:   Cocaine abuse (HCC)   Current Medications:  Current Facility-Administered Medications  Medication Dose Route Frequency Provider Last Rate Last Dose  . abacavir-dolutegravir-lamiVUDine (TRIUMEQ) 600-50-300 MG per tablet 1 tablet  1 tablet Oral Daily Nira ConnBerry, Jason A, NP   1 tablet at 10/10/18 [id number]  . alum & mag hydroxide-simeth (MAALOX/MYLANTA) 200-200-20 MG/5ML suspension 30 mL  30 mL Oral Q4H PRN Nira ConnBerry, Jason A, NP   30 mL at 10/10/18 0821  . feeding supplement (ENSURE ENLIVE) (ENSURE ENLIVE) liquid 237 mL  237 mL Oral BID BM Malvin JohnsFarah, Brian, MD   237 mL at 10/09/18 1424  . FLUoxetine (PROZAC) capsule 10 mg  10 mg Oral Daily Charm RingsLord, Jamison Y, NP   10 mg at 10/10/18 [id number]  . hydrOXYzine (ATARAX/VISTARIL) tablet 10 mg  10 mg Oral TID PRN Charm RingsLord, Jamison Y, NP      . ibuprofen (ADVIL,MOTRIN) tablet 600 mg  600 mg Oral Q6H PRN Donell SievertSimon, Spencer E, PA-C   600 mg at 10/08/18 1025  . loratadine (CLARITIN) tablet 10 mg  10 mg Oral Daily Maryagnes AmosStarkes-Perry, Takia S, FNP   10 mg at 10/10/18 [id number]  . magnesium hydroxide (MILK OF MAGNESIA) suspension 30 mL  30 mL Oral Daily PRN Nira ConnBerry, Jason A, NP      . nicotine (NICODERM CQ - dosed in mg/24 hours) patch 21 mg  21 mg Transdermal Daily Nira ConnBerry, Jason A, NP   21 mg at 10/10/18 0821  . OLANZapine (ZYPREXA) tablet 10 mg  10 mg Oral QHS Malvin JohnsFarah, Brian, MD   10 mg at 10/09/18 2118  . omega-3 acid ethyl esters (LOVAZA) capsule 1 g  1 g Oral BID Malvin JohnsFarah, Brian, MD   1 g at 10/10/18 [id number]  . prenatal multivitamin tablet 1 tablet  1 tablet Oral Q1200 Malvin JohnsFarah, Brian, MD   1 tablet at 10/09/18 1200   PTA Medications: Medications  Prior to Admission  Medication Sig Dispense Refill Last Dose  . abacavir-dolutegravir-lamiVUDine (TRIUMEQ) 600-50-300 MG tablet Take 1 tablet by mouth daily. 30 tablet 5 10/03/2018 at Unknown time  . ARIPiprazole (ABILIFY) 2 MG tablet Take 1 tablet (2 mg total) by mouth daily. (Patient not taking: Reported on 10/03/2018) 30 tablet 0 Not Taking at Unknown time  . FLUoxetine (PROZAC) 20 MG capsule Take 1 capsule (20 mg total) by mouth daily. (Patient not taking: Reported on 10/03/2018) 30 capsule 0 Not Taking at Unknown time  . traZODone (DESYREL) 100 MG tablet Take 1 tablet (100 mg total) by mouth at bedtime as needed for sleep. (Patient not taking: Reported on 10/03/2018) 30 tablet 0 Not Taking at Unknown time    Patient Stressors: Health problems Medication change or noncompliance Substance abuse  Patient Strengths: Capable of independent living Barrister's clerkCommunication skills Motivation for treatment/growth Supportive family/friends  Treatment Modalities: Medication Management, Group therapy, Case management,  1 to 1 session with clinician, Psychoeducation, Recreational therapy.   Physician Treatment Plan for Primary Diagnosis: Severe recurrent major depression w/psychotic features, mood-congruent (HCC) Long Term Goal(s): Improvement in symptoms so as ready for discharge Improvement in symptoms so as ready for discharge   Short Term Goals: Ability to verbalize feelings will improve Ability to disclose and discuss suicidal ideas Ability to demonstrate self-control will improve Ability  to maintain clinical measurements within normal limits will improve Ability to identify changes in lifestyle to reduce recurrence of condition will improve Ability to verbalize feelings will improve Ability to disclose and discuss suicidal ideas Ability to demonstrate self-control will improve  Medication Management: Evaluate patient's response, side effects, and tolerance of medication regimen.  Therapeutic  Interventions: 1 to 1 sessions, Unit Group sessions and Medication administration.  Evaluation of Outcomes: Progressing  Physician Treatment Plan for Secondary Diagnosis: Principal Problem:   Severe recurrent major depression w/psychotic features, mood-congruent (HCC) Active Problems:   Cocaine abuse (HCC)  Long Term Goal(s): Improvement in symptoms so as ready for discharge Improvement in symptoms so as ready for discharge   Short Term Goals: Ability to verbalize feelings will improve Ability to disclose and discuss suicidal ideas Ability to demonstrate self-control will improve Ability to maintain clinical measurements within normal limits will improve Ability to identify changes in lifestyle to reduce recurrence of condition will improve Ability to verbalize feelings will improve Ability to disclose and discuss suicidal ideas Ability to demonstrate self-control will improve     Medication Management: Evaluate patient's response, side effects, and tolerance of medication regimen.  Therapeutic Interventions: 1 to 1 sessions, Unit Group sessions and Medication administration.  Evaluation of Outcomes: Progressing   RN Treatment Plan for Primary Diagnosis: Severe recurrent major depression w/psychotic features, mood-congruent (HCC) Long Term Goal(s): Knowledge of disease and therapeutic regimen to maintain health will improve  Short Term Goals: Ability to participate in decision making will improve, Ability to verbalize feelings will improve, Ability to disclose and discuss suicidal ideas, Ability to identify and develop effective coping behaviors will improve and Compliance with prescribed medications will improve  Medication Management: RN will administer medications as ordered by provider, will assess and evaluate patient's response and provide education to patient for prescribed medication. RN will report any adverse and/or side effects to prescribing provider.  Therapeutic  Interventions: 1 on 1 counseling sessions, Psychoeducation, Medication administration, Evaluate responses to treatment, Monitor vital signs and CBGs as ordered, Perform/monitor CIWA, COWS, AIMS and Fall Risk screenings as ordered, Perform wound care treatments as ordered.  Evaluation of Outcomes: Progressing   LCSW Treatment Plan for Primary Diagnosis: Severe recurrent major depression w/psychotic features, mood-congruent (HCC) Long Term Goal(s): Safe transition to appropriate next level of care at discharge, Engage patient in therapeutic group addressing interpersonal concerns.  Short Term Goals: Engage patient in aftercare planning with referrals and resources  Therapeutic Interventions: Assess for all discharge needs, 1 to 1 time with Social worker, Explore available resources and support systems, Assess for adequacy in community support network, Educate family and significant other(s) on suicide prevention, Complete Psychosocial Assessment, Interpersonal group therapy.  Evaluation of Outcomes: Progressing   Progress in Treatment: Attending groups: Yes.  Participating in groups: Yes. Taking medication as prescribed: Yes. Toleration medication: Yes. Family/Significant other contact made: Yes, individual(s) contacted:  tried to reach partner and mom x2 each. Will complete SPE with patient. Patient understands diagnosis: Yes. Discussing patient identified problems/goals with staff: Yes. Medical problems stabilized or resolved: Yes. Denies suicidal/homicidal ideation: Yes. Issues/concerns per patient self-inventory: No.  New problem(s) identified: None   New Short Term/Long Term Goal(s): medication stabilization, elimination of SI thoughts, development of comprehensive mental wellness plan.    Patient Goals: To start over and do right    Discharge Plan or Barriers: Patient lives alone in [place], [place]. Patient reports that they would like to be referred to an outpatient provider for  continuity  of care. CSW will continue to follow and for appropriate referrals and possible discharge planning.   Reason for Continuation of Hospitalization: Anxiety Depression Medication stabilization Suicidal ideation  Estimated Length of Stay: 3-5 days   Attendees: Patient: 10/10/2018 8:27 AM  Physician: Dr. Malvin Johns, MD 10/10/2018 8:27 AM  Nursing: Lubertha Basque 10/10/2018 8:27 AM  RN Care Manager: Onnie Boer, RN 10/10/2018 8:27 AM  Social Worker: Baldo Daub, LCSWA [place], [place] 10/10/2018 8:27 AM  Recreational Therapist:  10/10/2018 8:27 AM  Other:  10/10/2018 8:27 AM  Other:  10/10/2018 8:27 AM  Other: 10/10/2018 8:27 AM    Scribe for Treatment Team: Darreld Mclean, LCSWA 10/10/2018 8:27 AM

## 2018-10-10 NOTE — Progress Notes (Signed)
[facility] MD Progress Note  10/10/2018 10:23 AM Jovita GammaXavier L Vanauken  MRN:  [id number] Subjective:  Patient's cognitive issues have cleared up dramatically he is less confused he is able to be fully oriented without prompts.  He states he is not ready to go home because he believes he needs some type of formal rehabilitation, drug screen did show cocaine, benzodiazepines, cannabis on admission and he reports also abusing heroin and has a history of amphetamine abuse as well  In summary Mr. Timmons is a complicated case because of so many comorbidities to include depression, psychosis, presenting with disorganization, polysubstance impairment, and some component of HIV encephalopathy.  All in the context of a personality disorder and a history of pseudoseizures as well  Principal Problem: Severe recurrent major depression w/psychotic features, mood-congruent (HCC) Diagnosis: Principal Problem:   Severe recurrent major depression w/psychotic features, mood-congruent (HCC) Active Problems:   Cocaine abuse (HCC)  Total Time spent with patient: 30 minutes  Past Psychiatric History: Extensive  Past Medical History:  Past Medical History:  Diagnosis Date  . ADHD (attention deficit hyperactivity disorder)   . Anal warts   . Anxiety   . Asthma   . Bipolar disorder (HCC)   . Chronic bronchitis (HCC)   . Chronic pain syndrome   . Convulsions (HCC) 08/14/2015  . Depression   . Gonorrhea 09/08/11  . Headache    "weekly" (11/06/2016)  . Hemorrhoids, internal, with bleeding 08/25/2011  . Hepatitis C   . Herpes   . HIV infection (HCC)   . Hypertension   . ILEITIS 09/24/2010   Qualifier: Diagnosis of  By: Nelson-Smith CMA (AAMA), Dottie    . Lymphoid hyperplasia, reactive   . Marijuana abuse   . Migraine    "monthly" (11/06/2016)  . Pseudoseizures   . Seizures (HCC)    "don't know why I have them" (11/06/2016)  . Syphilis     Past Surgical History:  Procedure Laterality Date  . ANKLE SURGERY  Bilateral 2005   Screw & Pins  . ANKLE SURGERY Right 2008   "replaced pins & screws"  . FOOT SURGERY Bilateral 2005   "bone spurs"  . I&D EXTREMITY Right 06/26/2016   Procedure: IRRIGATION AND DEBRIDEMENT RIGHT FOREARM;  Surgeon: Betha LoaKevin Kuzma, MD;  Location: [facility] OR;  Service: Orthopedics;  Laterality: Right;  . I&D EXTREMITY Right 11/04/2016   Procedure: IRRIGATION AND DEBRIDEMENT EXTREMITY;  Surgeon: Dairl PonderMatthew Weingold, MD;  Location: [facility] OR;  Service: Orthopedics;  Laterality: Right;  . I&D EXTREMITY Right 11/06/2016   Procedure: IRRIGATION AND DEBRIDEMENT right forarm;  Surgeon: Dairl PonderMatthew Weingold, MD;  Location: [facility] OR;  Service: Orthopedics;  Laterality: Right;  . TEE WITHOUT CARDIOVERSION N/A 11/19/2017   Procedure: TRANSESOPHAGEAL ECHOCARDIOGRAM (TEE);  Surgeon: Pricilla Riffleoss, Paula V, MD;  [facility];  Service: Cardiovascular;  Laterality: N/A;  . VIDEO ASSISTED THORACOSCOPY (VATS)/EMPYEMA Left 11/26/2017   Procedure: VIDEO ASSISTED THORACOSCOPY (VATS)/DRAIN EMPYEMA;  Surgeon: Kerin PernaVan Trigt, Peter, MD;  [facility];  Service: Thoracic;  Laterality: Left;   Family History:  Family History  Problem Relation Age of Onset  . Diabetes Mother   . Irritable bowel syndrome Mother   . Hypertension Mother   . Hyperlipidemia Mother   . Hypothyroidism Mother   . Diabetes Maternal Uncle   . Heart disease Maternal Grandmother   . Leukemia Other        maternal greatgrandmother  . Colon cancer Neg Hx    Social History:  Social History   Substance and  Sexual Activity  Alcohol Use Yes   Comment: 11/06/2016 "nothing in a very long time"     Social History   Substance and Sexual Activity  Drug Use Yes  . Frequency: 2.0 times per week  . Types: Cocaine, Benzodiazepines, Oxycodone, Marijuana, Heroin   Comment: yesterday    Social History   Socioeconomic History  . Marital status: Single    Spouse name: Not on file  . Number of children: 0  . Years of education: Not on file  . Highest  education level: Not on file  Occupational History  . Occupation: Production designer, theatre/television/film  Social Needs  . Financial resource strain: Not on file  . Food insecurity:    Worry: Not on file    Inability: Not on file  . Transportation needs:    Medical: Not on file    Non-medical: Not on file  Tobacco Use  . Smoking status: Current Every Day Smoker    Packs/day: 1.50    Years: 11.00    Pack years: 16.50    Types: Cigarettes  . Smokeless tobacco: Never Used  Substance and Sexual Activity  . Alcohol use: Yes    Comment: 11/06/2016 "nothing in a very long time"  . Drug use: Yes    Frequency: 2.0 times per week    Types: Cocaine, Benzodiazepines, Oxycodone, Marijuana, Heroin    Comment: yesterday  . Sexual activity: Not Currently    Partners: Male    Birth control/protection: Condom  Lifestyle  . Physical activity:    Days per week: Not on file    Minutes per session: Not on file  . Stress: Not on file  Relationships  . Social connections:    Talks on phone: Not on file    Gets together: Not on file    Attends religious service: Not on file    Active member of club or organization: Not on file    Attends meetings of clubs or organizations: Not on file    Relationship status: Not on file  Other Topics Concern  . Not on file  Social History Narrative   ** Merged History Encounter **       Additional Social History:                         Sleep: Good  Appetite:  Good  Current Medications: Current Facility-Administered Medications  Medication Dose Route Frequency Provider Last Rate Last Dose  . abacavir-dolutegravir-lamiVUDine (TRIUMEQ) 600-50-300 MG per tablet 1 tablet  1 tablet Oral Daily Nira Conn A, NP   1 tablet at 10/10/18 9826  . alum & mag hydroxide-simeth (MAALOX/MYLANTA) 200-200-20 MG/5ML suspension 30 mL  30 mL Oral Q4H PRN Nira Conn A, NP   30 mL at 10/10/18 0821  . feeding supplement (ENSURE ENLIVE) (ENSURE ENLIVE) liquid 237 mL  237 mL Oral BID BM Malvin Johns, MD   237 mL at 10/09/18 1424  . FLUoxetine (PROZAC) capsule 10 mg  10 mg Oral Daily Charm Rings, NP   10 mg at 10/10/18 4158  . hydrOXYzine (ATARAX/VISTARIL) tablet 10 mg  10 mg Oral TID PRN Charm Rings, NP      . ibuprofen (ADVIL,MOTRIN) tablet 600 mg  600 mg Oral Q6H PRN Donell Sievert E, PA-C   600 mg at 10/08/18 1025  . loratadine (CLARITIN) tablet 10 mg  10 mg Oral Daily Maryagnes Amos, FNP   10 mg at 10/10/18 3094  . magnesium hydroxide (  MILK OF MAGNESIA) suspension 30 mL  30 mL Oral Daily PRN Nira Conn A, NP      . nicotine (NICODERM CQ - dosed in mg/24 hours) patch 21 mg  21 mg Transdermal Daily Nira Conn A, NP   21 mg at 10/10/18 0821  . OLANZapine (ZYPREXA) tablet 10 mg  10 mg Oral QHS Malvin Johns, MD   10 mg at 10/09/18 2118  . omega-3 acid ethyl esters (LOVAZA) capsule 1 g  1 g Oral BID Malvin Johns, MD   1 g at 10/10/18 1610  . prenatal multivitamin tablet 1 tablet  1 tablet Oral Q1200 Malvin Johns, MD   1 tablet at 10/09/18 1200    Lab Results: No results found for this or any previous visit (from the past 48 hour(s)).  Blood Alcohol level:  Lab Results  Component Value Date   ETH <10 10/03/2018   ETH <10 09/24/2018    Metabolic Disorder Labs: Lab Results  Component Value Date   HGBA1C 5.8 (H) 11/25/2017   MPG 119.76 11/25/2017   No results found for: PROLACTIN Lab Results  Component Value Date   CHOL 97 01/06/2017   TRIG 114 01/06/2017   HDL 39 (L) 01/06/2017   CHOLHDL 2.5 01/06/2017   VLDL 23 01/06/2017   LDLCALC 35 01/06/2017   LDLCALC 43 03/26/2016    Physical Findings: AIMS: Facial and Oral Movements Muscles of Facial Expression: None, normal Lips and Perioral Area: None, normal Jaw: None, normal Tongue: None, normal,Extremity Movements Upper (arms, wrists, hands, fingers): None, normal Lower (legs, knees, ankles, toes): None, normal, Trunk Movements Neck, shoulders, hips: None, normal, Overall Severity Severity of  abnormal movements (highest score from questions above): None, normal Incapacitation due to abnormal movements: None, normal Patient's awareness of abnormal movements (rate only patient's report): No Awareness, Dental Status Current problems with teeth and/or dentures?: No Does patient usually wear dentures?: No  CIWA:  CIWA-Ar Total: 2 COWS:  COWS Total Score: 1  Musculoskeletal: Strength & Muscle Tone: within normal limits Gait & Station: normal  Psychiatric Specialty Exam: Physical Exam  ROS  Blood pressure 120/77, pulse (!) 113, temperature 98.4 F (36.9 C), temperature source Oral, resp. rate 16, height 5' 11.5" (1.816 m), weight 64 kg.Body mass index is 19.39 kg/m.  General Appearance: Casual  Eye Contact:  Good  Speech:  Slow  Volume:  Decreased  Mood:  Dysphoric  Affect:  Flat  Thought Process:  Descriptions of Associations: Circumstantial  Orientation:  Full (Time, Place, and Person)  Thought Content:  Logical  Suicidal Thoughts:  No  Homicidal Thoughts:  No  Memory:  Recent;   Fair  Judgement:  Fair  Insight:  Fair  Psychomotor Activity:  Decreased  Concentration:  Concentration: Fair  Recall:  [employer] of Knowledge:  Fair  Language:  Fair  Akathisia:  Negative  Handed:  Right  AIMS (if indicated):     Assets:  Resilience  ADL's:  Intact  Cognition:  WNL  Sleep:  Number of Hours: 6    For psychosis continue low-dose Zyprexa for HIV and HIV encephalopathy continue HIV meds continue B vitamins and omega-3's continue to monitor on current precautions for substance abuse issues continue to seek rehab Treatment Plan Summary: Daily contact with patient to assess and evaluate symptoms and progress in treatment and Medication management  Chancelor Hardrick, MD 10/10/2018, 10:23 AM

## 2018-10-10 NOTE — Progress Notes (Signed)
CSW spoke with patient regarding patient request for inpatient substance use treatment referral. [facility]   [facility] Their intake appointment is on 10/24/18. The patient has [facility]. CSW made patient aware that a referral and intake screening do not guarantee [facility], patient voiced understanding and requested CSW contact their mother, Pedro Owens [phone]).   CSW spoke with patient's mother. Mother was [facility] for [facility], CSW shared that patient has the soonest intake screening and has outpatient appointments in the mean time. CSW shared that the patient may discharge in the next few days, mother has concerns for the patient's confusion and immature speech patterns but notes that the patient's confusion and childlike speech have improved since admission.  Enid Cutterharlotte Jumana Paccione, LCSW-A Clinical Social Worker

## 2018-10-10 NOTE — Progress Notes (Signed)
Patient ID: Pedro Owens, male   DOB: 10-02-1991, 27 y.o.   MRN: [id number]   Pt currently presents with a flat affect and depressed behavior. Pt reports to writer no confusion this morning. Pt reports good sleep with current medication regimen. Pt remains in his room for most of the day, limited interaction with others noted.   Pt provided with medications per providers orders. Pt's labs and vitals were monitored throughout the night. Pt supported emotionally and encouraged to express concerns and questions. Pt educated on medications. Encouraged to attend groups and be a part of the milieu today.   Pt's safety ensured with 15 minute and environmental checks. Pt currently denies SI/HI and A/V hallucinations. Pt verbally agrees to seek staff if SI/HI or A/VH occurs and to consult with staff before acting on any harmful thoughts. Will continue POC.

## 2018-10-10 NOTE — Progress Notes (Signed)
Nursing Progress Note: 7p-7a D: Pt currently presents with a depressed/sad/flat affect and behavior. Pt states "I had a good day. I just slept a lot." Interacting minimally with the milieu. Pt reports good sleep during the previous night with current medication regimen. Pt did attend wrap-up group.  A: Pt provided with medications per providers orders. Pt's labs and vitals were monitored throughout the night. Pt supported emotionally and encouraged to express concerns and questions. Pt educated on medications.  R: Pt's safety ensured with 15 minute and environmental checks. Pt currently denies SI, HI, and AVH. Pt verbally contracts to seek staff if SI,HI, or AVH occurs and to consult with staff before acting on any harmful thoughts. Will continue to monitor.

## 2018-10-10 NOTE — Progress Notes (Signed)
Recreation Therapy Notes  Date: 12.23.19 Time: 1000 Location: [address]  Group Topic: Coping Skills  Goal Area(s) Addresses:  Patient will be able to identify positive coping skills. Patient will be able to identify benefit of using coping skills post d/c.  Intervention:  Worksheet  Activity:  Mind map.  LRT and patients filled in the first eight boxes together (anger, depression, relationships, things out of your control, death, wanting to give up, deflecting and disappointment).  Individually, patients were to then come up with at least three coping skills for each situation.  The group would come back together and LRT would write the coping skills on the board.  Education:Coping Skills, Discharge Planning.   Education Outcome: Acknowledges understanding/In group clarification offered/Needs additional education.   Clinical Observations/Feedback: Pt did not attend group.     Caroll RancherMarjette Eldine Rencher, LRT/CTRS        Caroll RancherLindsay, Marisha Renier A 10/10/2018 11:34 AM

## 2018-10-10 NOTE — [facility] Group Notes (Signed)
LCSW Group Therapy Note 10/10/2018 1:40 PM  Type of Therapy and Topic: Group Therapy: DBT House  Participation Level: Did Not Attend   Description of Group:  In this group patients will be encouraged to explore  their values, behaviors they want to change, emotions they wish to increase, protective factors, supports, coping skills, and motivational factors. They will be guided to discuss their thoughts, feelings, and behaviors related to these obstacles. The group will be asked to individually process the activity and share their insights with the group. This group will be process-oriented, with patients participating in exploration of their own experiences as well as giving and receiving support and challenge from other group members.   Therapeutic Goals: 1. Patient will identify their values 2. Patient will identify behaviors they wish to modify  3. Patient will identify feelings and emotions they wish to increase 4. Patient will identify strengths, supports, protective factors 5. Patient will identify coping skills 6. Patient will identify goals and motivating factors for change   Summary of Patient Progress Patient sleeping, did not attend.   Therapeutic Modalities:  Dialectical Behavioral Therapy  Motivational Interviewing Relapse Prevention Therapy

## 2018-10-11 MED ORDER — PRENATAL MULTIVITAMIN CH: 1.0000 | ORAL_TABLET | Freq: Every day | ORAL | 2 refills | Status: DC

## 2018-10-11 MED ORDER — FLUOXETINE HCL 10 MG PO CAPS: 10.0000 mg | ORAL_CAPSULE | Freq: Every day | ORAL | 1 refills | Status: DC

## 2018-10-11 MED ORDER — OMEGA-3-ACID ETHYL ESTERS 1 G PO CAPS: 1.0000 g | ORAL_CAPSULE | Freq: Two times a day (BID) | ORAL | 1 refills | Status: DC

## 2018-10-11 MED ORDER — OLANZAPINE 10 MG PO TABS: 10.0000 mg | ORAL_TABLET | Freq: Every day | ORAL | 2 refills | Status: DC

## 2018-10-11 NOTE — Progress Notes (Signed)
Recreation Therapy Notes  INPATIENT RECREATION TR PLAN  Patient Details Name: JAYVYN HASELTON MRN: [id number] DOB: Jan 08, 1991 Today's Date: 10/11/2018  Rec Therapy Plan Is patient appropriate for Therapeutic Recreation?: Yes Treatment times per week: about 3 days Estimated Length of Stay: 5-7 days TR Treatment/Interventions: Group participation (Comment)  Discharge Criteria Pt will be discharged from therapy if:: Discharged Treatment plan/goals/alternatives discussed and agreed upon by:: Patient/family  Discharge Summary Short term goals set: See patient care plan Short term goals met: Not met Progress toward goals comments: Groups attended Which groups?: Wellness, Other (Comment)(Team building) Reason goals not met: Pt unable to identify coping skills. Therapeutic equipment acquired: N/A Reason patient discharged from therapy: Discharge from hospital Pt/family agrees with progress & goals achieved: Yes Date patient discharged from therapy: 10/11/18    Victorino Sparrow, LRT/CTRS  Ria Comment, Lexany Belknap A 10/11/2018, 8:52 AM

## 2018-10-11 NOTE — Plan of Care (Signed)
Pt attended two recreation therapy group sessions but was unable to identify coping skills.   Caroll RancherMarjette Adolph Clutter, LRT/CTRS

## 2018-10-11 NOTE — Progress Notes (Signed)
Patient ID: Pedro Owens, male   DOB: July 18, 1991, 27 y.o.   MRN: [id number]  D: Pt alert and oriented on the unit.   A: Education, support, and encouragement provided. Discharge summary, medications and follow up appointments reviewed with pt. Suicide prevention resources provided, including "My 3 App." Pt's belongings in locker # 32 returned and belongings sheet signed.  R: Pt denies SI/HI, A/VH, pain, or any concerns at this time. Pt ambulatory on and off unit. Pt discharged to lobby.

## 2018-10-11 NOTE — [facility] Suicide Risk Assessment (Signed)
[facility] Discharge Suicide Risk Assessment   Principal Problem: Severe recurrent major depression w/psychotic features, mood-congruent [facility]) Discharge Diagnoses: Principal Problem:   Severe recurrent major depression w/psychotic features, mood-congruent (HCC) Active Problems:   Cocaine abuse (HCC)   Total Time spent with patient: 45 minutes   Mental Status Per Nursing Assessment::   On Admission:  NA  Demographic Factors:  Male and Cardell Peach, lesbian, or bisexual orientation  Loss Factors: Decrease in vocational status  Historical Factors: Impulsivity  Risk Reduction Factors:   Sense of responsibility to family and Religious beliefs about death  Continued Clinical Symptoms:  Alcohol/Substance Abuse/Dependencies More than one psychiatric diagnosis  Cognitive Features That Contribute To Risk:  Loss of executive function    Suicide Risk:  Minimal: No identifiable suicidal ideation.  Patients presenting with no risk factors but with morbid ruminations; may be classified as minimal risk based on the severity of the depressive symptoms  Follow-up Information    [facility]. Go on 10/18/2018.   Specialty:  Behavioral Health Why:  Your hosptial follow up appointment is Tuesday, 10/18/18 at 8:30a.  Please bring: photo ID, proof of insurance, social security card, and discharge paperwork from this hospitalization.  Contact information: [address] [place] [address] [phone]        Services, [facility]. Go on 10/24/2018.   Why:  Please attend your intake screening 10/24/18 at 7:45am. Contact information: [address] [place] [address] [phone]           Plan Of Care/Follow-up recommendations:  Activity:  full  Lyssa Hackley, MD 10/11/2018, 9:52 AM
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
## 2018-10-11 NOTE — Progress Notes (Signed)
  [facility] Adult Case Management Discharge Plan :  Will you be returning to the same living situation after discharge:  Yes,  own apartment At discharge, do you have transportation home?: Yes,  mother Do you have the ability to pay for your medications: No. Will work with [employer].    Release of information consent forms completed and in the chart;  Patient's signature needed at discharge.  Patient to Follow up at: Follow-up Information    [facility]. Go on 10/18/2018.   Specialty:  Behavioral Health Why:  Your hosptial follow up appointment is Tuesday, 10/18/18 at 8:30a.  Please bring: photo ID, proof of insurance, social security card, and discharge paperwork from this hospitalization.  Contact information: [address] [place] [address] [phone]        [facility]. Go on 10/24/2018.   Why:  Please attend your intake screening 10/24/18 at 7:45am. Contact information: [address] [place] [address] [phone]           Next level of care provider has access to [facility] Link:no  Safety Planning and Suicide Prevention discussed: No. Attempts made.    Have you used any form of tobacco in the last 30 days? (Cigarettes, Smokeless Tobacco, Cigars, and/or Pipes): Yes  Has patient been referred to the Quitline?: Yes, faxed on 10/11/18  Patient has been referred for addiction treatment: Yes  Lorri Frederick, LCSW 10/11/2018, 10:11 AM

## 2018-10-11 NOTE — Progress Notes (Signed)
Recreation Therapy Notes  Date: 12.24.19 Time: 1000 Location: [address] Dayroom  Group Topic: Goal Setting  Goal Area(s) Addresses:  Patient will identify the importance of setting goals. Patient will verbalize benefit of having goals. Patient will verbalize benefit of having goals post d/c.  Intervention: Worksheet  Activity: Garment/textile technologist.  Patients were to identify goals they wanted to accomplish within the next week, month, year and five years.  Patients were to then identify the obstacles they would face, what they would need to achieve their goals and what they can start doing now to reach their goals.  Education: Communication, Discharge Planning  Education Outcome: Acknowledges understanding/In group clarification offered/Needs additional education.   Clinical Observations/Feedback: Pt did not attend group.    Caroll Rancher, LRT/CTRS    Caroll Rancher A 10/11/2018 10:23 AM

## 2018-10-31 ENCOUNTER — Emergency Department (HOSPITAL_COMMUNITY): Payer: Self-pay

## 2018-10-31 ENCOUNTER — Encounter (HOSPITAL_COMMUNITY): Payer: Self-pay

## 2018-10-31 ENCOUNTER — Emergency Department (HOSPITAL_COMMUNITY)
Admission: EM | Admit: 2018-10-31 | Discharge: 2018-10-31 | Disposition: A | Discharge status: Home / Self Care | Payer: Self-pay | Attending: Emergency Medicine | Admitting: Emergency Medicine

## 2018-10-31 ENCOUNTER — Other Ambulatory Visit: Payer: Self-pay

## 2018-10-31 DIAGNOSIS — R197 Diarrhea, unspecified: Secondary | ICD-10-CM

## 2018-10-31 DIAGNOSIS — J45909 Unspecified asthma, uncomplicated: Secondary | ICD-10-CM | POA: Insufficient documentation

## 2018-10-31 DIAGNOSIS — K529 Noninfective gastroenteritis and colitis, unspecified: Secondary | ICD-10-CM | POA: Insufficient documentation

## 2018-10-31 DIAGNOSIS — Z79899 Other long term (current) drug therapy: Secondary | ICD-10-CM | POA: Insufficient documentation

## 2018-10-31 DIAGNOSIS — F17210 Nicotine dependence, cigarettes, uncomplicated: Secondary | ICD-10-CM | POA: Insufficient documentation

## 2018-10-31 DIAGNOSIS — I10 Essential (primary) hypertension: Secondary | ICD-10-CM | POA: Insufficient documentation

## 2018-10-31 LAB — URINALYSIS, ROUTINE W REFLEX MICROSCOPIC
Bilirubin Urine: NEGATIVE
Glucose, UA: NEGATIVE mg/dL
Hgb urine dipstick: NEGATIVE
Ketones, ur: NEGATIVE mg/dL
Leukocytes, UA: NEGATIVE
Nitrite: NEGATIVE
Protein, ur: NEGATIVE mg/dL
Specific Gravity, Urine: 1.021 (ref 1.005–1.030)
pH: 6 (ref 5.0–8.0)

## 2018-10-31 LAB — COMPREHENSIVE METABOLIC PANEL
ALBUMIN: 3.6 g/dL (ref 3.5–5.0)
ALT: 62 U/L — ABNORMAL HIGH (ref 0–44)
AST: 58 U/L — ABNORMAL HIGH (ref 15–41)
Alkaline Phosphatase: 97 U/L (ref 38–126)
Anion gap: 10 (ref 5–15)
BUN: 18 mg/dL (ref 6–20)
CO2: 25 mmol/L (ref 22–32)
Calcium: 9.3 mg/dL (ref 8.9–10.3)
Chloride: 104 mmol/L (ref 98–111)
Creatinine, Ser: 1.08 mg/dL (ref 0.61–1.24)
GFR calc Af Amer: >60 mL/min (ref >60)
GFR calc non Af Amer: >60 mL/min (ref >60)
Glucose, Bld: 126 mg/dL — ABNORMAL HIGH (ref 70–99)
Potassium: 4.5 mmol/L (ref 3.5–5.1)
Sodium: 139 mmol/L (ref 135–145)
Total Bilirubin: 0.7 mg/dL (ref 0.3–1.2)
Total Protein: 7 g/dL (ref 6.5–8.1)

## 2018-10-31 LAB — CBC
HCT: 44.8 % (ref 39.0–52.0)
Hemoglobin: 14.7 g/dL (ref 13.0–17.0)
MCH: 29.5 pg (ref 26.0–34.0)
MCHC: 32.8 g/dL (ref 30.0–36.0)
MCV: 89.8 fL (ref 80.0–100.0)
Platelets: 297 10*3/uL (ref 150–400)
RBC: 4.99 MIL/uL (ref 4.22–5.81)
RDW: 14.6 % (ref 11.5–15.5)
WBC: 7.6 10*3/uL (ref 4.0–10.5)
nRBC: 0 % (ref 0.0–0.2)

## 2018-10-31 LAB — LIPASE, BLOOD: Lipase: 35 U/L (ref 11–51)

## 2018-10-31 MED ORDER — ONDANSETRON HCL 4 MG/2ML IJ SOLN
4.0000 mg | Freq: Once | INTRAMUSCULAR | Status: AC
  Administered 2018-10-31: 4 mg via INTRAVENOUS
  Filled 2018-10-31: qty 2

## 2018-10-31 MED ORDER — LOPERAMIDE HCL 2 MG PO CAPS: 2.0000 mg | ORAL_CAPSULE | ORAL | 0 refills | Status: DC | PRN

## 2018-10-31 MED ORDER — KETOROLAC TROMETHAMINE 15 MG/ML IJ SOLN
15.0000 mg | Freq: Once | INTRAMUSCULAR | Status: AC
  Administered 2018-10-31: 15 mg via INTRAVENOUS
  Filled 2018-10-31: qty 1

## 2018-10-31 MED ORDER — IOPAMIDOL (ISOVUE-300) INJECTION 61%
INTRAVENOUS | Status: AC
  Filled 2018-10-31: qty 100

## 2018-10-31 MED ORDER — ONDANSETRON HCL 4 MG PO TABS: 4.0000 mg | ORAL_TABLET | Freq: Four times a day (QID) | ORAL | 0 refills | Status: DC

## 2018-10-31 MED ORDER — SODIUM CHLORIDE (PF) 0.9 % IJ SOLN
INTRAMUSCULAR | Status: AC
  Filled 2018-10-31: qty 50

## 2018-10-31 MED ORDER — MORPHINE SULFATE (PF) 4 MG/ML IV SOLN
4.0000 mg | Freq: Once | INTRAVENOUS | Status: AC
  Administered 2018-10-31: 4 mg via INTRAVENOUS
  Filled 2018-10-31: qty 1

## 2018-10-31 MED ORDER — IOPAMIDOL (ISOVUE-300) INJECTION 61%
100.0000 mL | Freq: Once | INTRAVENOUS | Status: AC | PRN
  Administered 2018-10-31: 100 mL via INTRAVENOUS

## 2018-10-31 MED ORDER — SODIUM CHLORIDE 0.9 % IV BOLUS
1000.0000 mL | Freq: Once | INTRAVENOUS | Status: AC
  Administered 2018-10-31: 1000 mL via INTRAVENOUS

## 2018-10-31 MED ORDER — LOPERAMIDE HCL 2 MG PO CAPS
4.0000 mg | ORAL_CAPSULE | Freq: Once | ORAL | Status: AC
  Administered 2018-10-31: 4 mg via ORAL
  Filled 2018-10-31: qty 2

## 2018-10-31 NOTE — ED Notes (Signed)
Needs light green tube

## 2018-10-31 NOTE — ED Provider Notes (Signed)
[facility]-EMERGENCY DEPT Provider Note   CSN: [id number] Arrival date & time: 10/31/18  1550     History   Chief Complaint Chief Complaint  Patient presents with  . Abdominal Pain    HPI Pedro Owens is a 29 y.o. male.  HPI   27yM with abdominal pain. L sided. Onset about a week ago. Associated diarrhea. 4-5x/day. Subjective fever. No recorded fever. No blood in stool. No urinary complaints. No sick contacts. Has tried NSAIDs with some improvement of pain. No recent significant travel history.   Past Medical History:  Diagnosis Date  . ADHD (attention deficit hyperactivity disorder)   . Anal warts   . Anxiety   . Asthma   . Bipolar disorder (HCC)   . Chronic bronchitis (HCC)   . Chronic pain syndrome   . Convulsions (HCC) 08/14/2015  . Depression   . Gonorrhea 09/08/11  . Headache    "weekly" (11/06/2016)  . Hemorrhoids, internal, with bleeding 08/25/2011  . Hepatitis C   . Herpes   . HIV infection (HCC)   . Hypertension   . ILEITIS 09/24/2010   Qualifier: Diagnosis of  By: Nelson-Smith CMA (AAMA), Dottie    . Lymphoid hyperplasia, reactive   . Marijuana abuse   . Migraine    "monthly" (11/06/2016)  . Pseudoseizures   . Seizures (HCC)    "don't know why I have them" (11/06/2016)  . Syphilis     Patient Active Problem List   Diagnosis Date Noted  . Cocaine abuse (HCC) 10/08/2018  . Severe recurrent major depression w/psychotic features, mood-congruent (HCC) 10/04/2018  . Substance induced mood disorder (HCC) 04/03/2018  . Pressure injury of skin 11/26/2017  . SOB (shortness of breath)   . Septic embolism (HCC)   . Transgender   . Pleural effusion, bacterial 11/23/2017  . Loculated pleural effusion 11/23/2017  . Hepatitis C antibody test positive 11/19/2017  . Septic pulmonary embolism (HCC) 11/19/2017  . Acute endocarditis   . Pleuritic chest pain   . IVDU (intravenous drug user)   . Endocarditis, suspected 11/18/2017  . SIRS  (systemic inflammatory response syndrome) (HCC) 11/15/2017  . Injury, superficial, elbow, forearm, or wrist with infection, right, initial encounter 11/12/2016  . Abscess of right forearm 11/04/2016  . HIV disease (HCC) 04/07/2016  . Cigarette smoker 04/07/2016  . Polysubstance abuse (HCC) 04/07/2016  . Depression 08/14/2015  . Conversion disorder 08/14/2015  . Hemorrhoids, internal, with bleeding 08/25/2011  . Anal warts 08/25/2011  . ANXIETY 09/24/2010  . ADHD 09/24/2010    Past Surgical History:  Procedure Laterality Date  . ANKLE SURGERY Bilateral 2005   Screw & Pins  . ANKLE SURGERY Right 2008   "replaced pins & screws"  . FOOT SURGERY Bilateral 2005   "bone spurs"  . I&D EXTREMITY Right 06/26/2016   Procedure: IRRIGATION AND DEBRIDEMENT RIGHT FOREARM;  Surgeon: Betha Loa, MD;  Location: [facility] OR;  Service: Orthopedics;  Laterality: Right;  . I&D EXTREMITY Right 11/04/2016   Procedure: IRRIGATION AND DEBRIDEMENT EXTREMITY;  Surgeon: Dairl Ponder, MD;  Location: [facility] OR;  Service: Orthopedics;  Laterality: Right;  . I&D EXTREMITY Right 11/06/2016   Procedure: IRRIGATION AND DEBRIDEMENT right forarm;  Surgeon: Dairl Ponder, MD;  Location: [facility] OR;  Service: Orthopedics;  Laterality: Right;  . TEE WITHOUT CARDIOVERSION N/A 11/19/2017   Procedure: TRANSESOPHAGEAL ECHOCARDIOGRAM (TEE);  Surgeon: Pricilla Riffle, MD;  Location: [facility] ENDOSCOPY;  Service: Cardiovascular;  Laterality: N/A;  . VIDEO ASSISTED THORACOSCOPY (VATS)/EMPYEMA Left  11/26/2017   Procedure: VIDEO ASSISTED THORACOSCOPY (VATS)/DRAIN EMPYEMA;  Surgeon: Kerin PernaVan Trigt, Peter, MD;  [facility];  Service: Thoracic;  Laterality: Left;        Home Medications    Prior to Admission medications   Medication Sig Start Date End Date Taking? Authorizing Provider  abacavir-dolutegravir-lamiVUDine (TRIUMEQ) 600-50-300 MG tablet Take 1 tablet by mouth daily. 07/11/18   Kuppelweiser, Cassie L, RPH-CPP  FLUoxetine (PROZAC) 10 MG  capsule Take 1 capsule (10 mg total) by mouth daily. 10/12/18   Malvin JohnsFarah, Brian, MD  loperamide (IMODIUM) 2 MG capsule Take 1 capsule (2 mg total) by mouth every 4 (four) hours as needed for diarrhea or loose stools. 10/31/18   Raeford RazorKohut, Judea Fennimore, MD  OLANZapine (ZYPREXA) 10 MG tablet Take 1 tablet (10 mg total) by mouth at bedtime. 10/11/18   Malvin JohnsFarah, Brian, MD  omega-3 acid ethyl esters (LOVAZA) 1 g capsule Take 1 capsule (1 g total) by mouth 2 (two) times daily. 10/11/18   Malvin JohnsFarah, Brian, MD  ondansetron (ZOFRAN) 4 MG tablet Take 1 tablet (4 mg total) by mouth every 6 (six) hours. 10/31/18   Raeford RazorKohut, Lukasz Rogus, MD  Prenatal Vit-Fe Fumarate-FA (PRENATAL MULTIVITAMIN) TABS tablet Take 1 tablet by mouth daily at 12 noon. 10/11/18   Malvin JohnsFarah, Brian, MD  divalproex (DEPAKOTE) 500 MG DR tablet Take 1 tablet (500 mg total) by mouth 2 (two) times daily. 02/28/16 02/28/16  Lindalou Hose'Rourke, Sean, MD    Family History Family History  Problem Relation Age of Onset  . Diabetes Mother   . Irritable bowel syndrome Mother   . Hypertension Mother   . Hyperlipidemia Mother   . Hypothyroidism Mother   . Diabetes Maternal Uncle   . Heart disease Maternal Grandmother   . Leukemia Other        maternal greatgrandmother  . Colon cancer Neg Hx     Social History Social History   Tobacco Use  . Smoking status: Current Every Day Smoker    Packs/day: 1.50    Years: 11.00    Pack years: 16.50    Types: Cigarettes  . Smokeless tobacco: Never Used  Substance Use Topics  . Alcohol use: Yes    Comment: 11/06/2016 "nothing in a very long time"  . Drug use: Yes    Frequency: 2.0 times per week    Types: Cocaine, Benzodiazepines, Oxycodone, Marijuana, Heroin    Comment: yesterday     Allergies   Acetaminophen; Other; Vicodin [hydrocodone-acetaminophen]; and Dilaudid [hydromorphone hcl]   Review of Systems Review of Systems  All systems reviewed and negative, other than as noted in HPI.  Physical Exam Updated Vital Signs BP  125/76   Pulse 86   Temp 99 F (37.2 C) (Oral)   Resp 18   Ht 5' 11.5" (1.816 m)   Wt 64 kg   SpO2 99%   BMI 19.40 kg/m   Physical Exam Vitals signs and nursing note reviewed.  Constitutional:      General: He is not in acute distress.    Appearance: He is well-developed.  HENT:     Head: Normocephalic and atraumatic.  Eyes:     General:        Right eye: No discharge.        Left eye: No discharge.     Conjunctiva/sclera: Conjunctivae normal.  Neck:     Musculoskeletal: Neck supple.  Cardiovascular:     Rate and Rhythm: Normal rate and regular rhythm.     Heart sounds: Normal heart  sounds. No murmur. No friction rub. No gallop.   Pulmonary:     Effort: Pulmonary effort is normal. No respiratory distress.     Breath sounds: Normal breath sounds.  Abdominal:     General: There is no distension.     Palpations: Abdomen is soft.     Tenderness: There is abdominal tenderness.     Comments: Mild TTP L sided and to a lesser degree suprapubically.   Musculoskeletal:        General: No tenderness.  Skin:    General: Skin is warm and dry.  Neurological:     Mental Status: He is alert.  Psychiatric:        Behavior: Behavior normal.        Thought Content: Thought content normal.      ED Treatments / Results  Labs (all labs ordered are listed, but only abnormal results are displayed) Labs Reviewed  COMPREHENSIVE METABOLIC PANEL - Abnormal; Notable for the following components:      Result Value   Glucose, Bld 126 (*)    AST 58 (*)    ALT 62 (*)    All other components within normal limits  CBC  URINALYSIS, ROUTINE W REFLEX MICROSCOPIC  LIPASE, BLOOD    EKG None  Radiology Ct Abdomen Pelvis W Contrast  Result Date: 10/31/2018 CLINICAL DATA:  Abdominal pain, primarily left-sided EXAM: CT ABDOMEN AND PELVIS WITH CONTRAST TECHNIQUE: Multidetector CT imaging of the abdomen and pelvis was performed using the standard protocol following bolus administration of  intravenous contrast. CONTRAST:  ISOVUE-300 IOPAMIDOL (ISOVUE-300) INJECTION 61% COMPARISON:  August 15, 2015 FINDINGS: Lower chest: There is atelectatic change in the left base. Lung bases otherwise are clear. Hepatobiliary: No focal liver lesions are appreciable. Gallbladder wall is not appreciably thickened. There is no biliary duct dilatation. Pancreas: No pancreatic mass or inflammatory focus. Spleen: No splenic lesions are evident. Adrenals/Urinary Tract: Adrenals bilaterally appear unremarkable. Kidneys bilaterally show no evident mass or hydronephrosis on either side. There is no appreciable renal or ureteral calculus on either side. Urinary bladder is midline with wall thickness within normal limits. Stomach/Bowel: Stomach is filled with food material. Stomach is not appreciably distended. There is mild wall thickening involving several loops of jejunum. There is no associated mesenteric thickening. There is no appreciable bowel obstruction. There is no free air or portal venous air. Vascular/Lymphatic: There is no appreciable abdominal aortic aneurysm. No evident vascular lesion. No adenopathy appreciable in the abdomen or pelvis. Reproductive: Prostate and seminal vesicles appear normal in size and contour. No evident pelvic mass. Other: Appendix appears normal. There is no abscess or ascites in the abdomen or pelvis. Musculoskeletal: There are no blastic or lytic bone lesions. There is no intramuscular or abdominal wall lesion evident. IMPRESSION: 1. Mild wall thickening involving several loops of jejunum. Suspect a degree of enteritis. No evident bowel obstruction. No abscess in the abdomen or pelvis. Appendix appears normal. 2.  No evident renal or ureteral calculus.  No hydronephrosis. Electronically Signed   By: Bretta Bang III M.D.   On: 10/31/2018 19:08    Procedures Procedures (including critical care time)  Medications Ordered in ED Medications  sodium chloride (PF) 0.9 %  injection (has no administration in time range)  ketorolac (TORADOL) 15 MG/ML injection 15 mg (has no administration in time range)  sodium chloride 0.9 % bolus 1,000 mL (0 mLs Intravenous Stopped 10/31/18 1845)  morphine 4 MG/ML injection 4 mg (4 mg Intravenous Given 10/31/18  1756)  loperamide (IMODIUM) capsule 4 mg (4 mg Oral Given 10/31/18 1755)  iopamidol (ISOVUE-300) 61 % injection 100 mL (100 mLs Intravenous Contrast Given 10/31/18 1836)  ondansetron (ZOFRAN) injection 4 mg (4 mg Intravenous Given 10/31/18 1847)     Initial Impression / Assessment and Plan / ED Course  I have reviewed the triage vital signs and the nursing notes.  Pertinent labs & imaging results that were available during my care of the patient were reviewed by me and considered in my medical decision making (see chart for details).     27yM with abdominal pain and diarrhea. Afebrile. Mild tenderness on exam but CT w/o overly concerning findings. Likely enteritis. Feeling better. Plan for continued symptomatic tx.  It has been determined that no acute conditions requiring further emergency intervention are present at this time. The patient has been advised of the diagnosis and plan. I reviewed any labs and imaging including any potential incidental findings. I have reviewed nursing notes and appropriate previous records. We have discussed signs and symptoms that warrant return to the ED and they are listed in the discharge instructions.      Final Clinical Impressions(s) / ED Diagnoses   Final diagnoses:  Enteritis  Diarrhea, unspecified type    ED Discharge Orders         Ordered    loperamide (IMODIUM) 2 MG capsule  Every 4 hours PRN     10/31/18 1924    ondansetron (ZOFRAN) 4 MG tablet  Every 6 hours     10/31/18 1924           Raeford Razor, MD 11/02/18 1606

## 2018-10-31 NOTE — ED Triage Notes (Signed)
Pt BIBA from home. Pt c/o LUQ pain x 1 week. Pt states he stepped off sidewalk weird and felt knot. Denies N/V/D. Denies CP, SHOB. Pain with palpation. Pt states BM last night.

## 2018-10-31 NOTE — ED Notes (Signed)
Resent light green tube to lab.

## 2018-12-29 ENCOUNTER — Ambulatory Visit: Payer: Medicaid Other

## 2018-12-29 ENCOUNTER — Other Ambulatory Visit: Payer: Medicaid Other

## 2019-01-18 ENCOUNTER — Telehealth: Payer: Self-pay | Admitting: *Deleted

## 2019-01-18 NOTE — Telephone Encounter (Signed)
Per Dr Orvan Falconer spoke with patient mother and canceled the appointment. She will have him call as soon as he is available to reschedule for labs and office visit in early June.

## 2019-01-19 ENCOUNTER — Encounter: Payer: Medicaid Other | Admitting: Internal Medicine

## 2019-04-03 ENCOUNTER — Other Ambulatory Visit: Payer: Self-pay | Admitting: *Deleted

## 2019-04-03 DIAGNOSIS — B20 Human immunodeficiency virus [HIV] disease: Secondary | ICD-10-CM

## 2019-04-03 MED ORDER — TRIUMEQ 600-50-300 MG PO TABS: 1.0000 | ORAL_TABLET | Freq: Every day | ORAL | 0 refills | Status: DC

## 2019-04-04 ENCOUNTER — Telehealth: Payer: Self-pay | Admitting: *Deleted

## 2019-04-04 NOTE — Telephone Encounter (Signed)
RN called patient at number listed in chart. This is actually his mothers. RN asked to speak with Pedro Owens, but patient does not live there. Mother says "they just usually call me for everything." She will pass along a message for patient to call back tomorrow and schedule an appointment. He is over due for a visit, was given 30 day supply medication until he is seen. Landis Gandy, RN

## 2019-04-14 ENCOUNTER — Other Ambulatory Visit: Payer: Self-pay | Admitting: *Deleted

## 2019-04-14 DIAGNOSIS — B20 Human immunodeficiency virus [HIV] disease: Secondary | ICD-10-CM

## 2019-04-19 ENCOUNTER — Other Ambulatory Visit: Payer: Self-pay

## 2019-04-19 ENCOUNTER — Ambulatory Visit: Payer: Self-pay

## 2019-04-19 DIAGNOSIS — B20 Human immunodeficiency virus [HIV] disease: Secondary | ICD-10-CM

## 2019-04-20 LAB — T-HELPER CELL (CD4) - (RCID CLINIC ONLY)
CD4 % Helper T Cell: 33 % (ref 33–65)
CD4 T Cell Abs: 398 /uL — ABNORMAL LOW (ref 400–1790)

## 2019-04-27 LAB — CBC WITH DIFFERENTIAL/PLATELET
Absolute Monocytes: 678 cells/uL (ref 200–950)
Basophils Absolute: 19 cells/uL (ref 0–200)
Basophils Relative: 0.3 %
Eosinophils Absolute: 90 cells/uL (ref 15–500)
Eosinophils Relative: 1.4 %
HCT: 44.5 % (ref 38.5–50.0)
Hemoglobin: 15.3 g/dL (ref 13.2–17.1)
Lymphs Abs: 1274 cells/uL (ref 850–3900)
MCH: 28.9 pg (ref 27.0–33.0)
MCHC: 34.4 g/dL (ref 32.0–36.0)
MCV: 84 fL (ref 80.0–100.0)
MPV: 10.1 fL (ref 7.5–12.5)
Monocytes Relative: 10.6 %
Neutro Abs: 4339 cells/uL (ref 1500–7800)
Neutrophils Relative %: 67.8 %
Platelets: 306 10*3/uL (ref 140–400)
RBC: 5.3 10*6/uL (ref 4.20–5.80)
RDW: 13.4 % (ref 11.0–15.0)
Total Lymphocyte: 19.9 %
WBC: 6.4 10*3/uL (ref 3.8–10.8)

## 2019-04-27 LAB — COMPLETE METABOLIC PANEL WITH GFR
AG Ratio: 1.2 (calc) (ref 1.0–2.5)
ALT: 74 U/L — ABNORMAL HIGH (ref 9–46)
AST: 76 U/L — ABNORMAL HIGH (ref 10–40)
Albumin: 4.3 g/dL (ref 3.6–5.1)
Alkaline phosphatase (APISO): 76 U/L (ref 36–130)
BUN: 12 mg/dL (ref 7–25)
CO2: 27 mmol/L (ref 20–32)
Calcium: 9.4 mg/dL (ref 8.6–10.3)
Chloride: 103 mmol/L (ref 98–110)
Creat: 1.02 mg/dL (ref 0.60–1.35)
GFR, Est African American: 117 mL/min/{1.73_m2} (ref >=60)
GFR, Est Non African American: 101 mL/min/{1.73_m2} (ref >=60)
Globulin: 3.5 g/dL (calc) (ref 1.9–3.7)
Glucose, Bld: 93 mg/dL (ref 65–99)
Potassium: 3.7 mmol/L (ref 3.5–5.3)
Sodium: 139 mmol/L (ref 135–146)
Total Bilirubin: 0.8 mg/dL (ref 0.2–1.2)
Total Protein: 7.8 g/dL (ref 6.1–8.1)

## 2019-04-27 LAB — HIV-1 RNA QUANT-NO REFLEX-BLD
HIV 1 RNA Quant: 17000 copies/mL — ABNORMAL HIGH
HIV-1 RNA Quant, Log: 4.23 Log copies/mL — ABNORMAL HIGH

## 2019-05-04 ENCOUNTER — Encounter: Payer: Medicaid Other | Admitting: Internal Medicine

## 2019-05-11 ENCOUNTER — Encounter: Payer: Self-pay | Admitting: Internal Medicine

## 2019-06-29 ENCOUNTER — Telehealth: Payer: Self-pay | Admitting: *Deleted

## 2019-06-29 NOTE — Telephone Encounter (Signed)
RN called patient, left message asking Dan to call their doctor's office. Patient has not been seen by a physician in clinic since 2017, has had spotty follow up with pharmacy visits, the last one being 06/2018.  [facility] has renewed RW/ADAP (is current through 01/16/20), but should be out of medication (last refill sent 03/2019).  Last labs show detectable viral load of 17,000. Will send patient to bridge counseling. Landis Gandy, RN

## 2019-07-19 ENCOUNTER — Other Ambulatory Visit: Payer: Self-pay

## 2019-07-19 ENCOUNTER — Other Ambulatory Visit: Payer: Self-pay | Admitting: *Deleted

## 2019-07-19 DIAGNOSIS — B20 Human immunodeficiency virus [HIV] disease: Secondary | ICD-10-CM

## 2019-07-19 DIAGNOSIS — Z113 Encounter for screening for infections with a predominantly sexual mode of transmission: Secondary | ICD-10-CM

## 2019-07-20 LAB — URINE CYTOLOGY ANCILLARY ONLY
Chlamydia: NEGATIVE
Molecular Disclaimer: NEGATIVE
Molecular Disclaimer: NORMAL
Neisseria Gonorrhea: NEGATIVE

## 2019-07-20 LAB — T-HELPER CELL (CD4) - (RCID CLINIC ONLY)
CD4 % Helper T Cell: 35 % (ref 33–65)
CD4 T Cell Abs: 482 /uL (ref 400–1790)

## 2019-07-21 LAB — COMPLETE METABOLIC PANEL WITH GFR
AG Ratio: 1.2 (calc) (ref 1.0–2.5)
ALT: 70 U/L — ABNORMAL HIGH (ref 9–46)
AST: 54 U/L — ABNORMAL HIGH (ref 10–40)
Albumin: 4.3 g/dL (ref 3.6–5.1)
Alkaline phosphatase (APISO): 78 U/L (ref 36–130)
BUN: 23 mg/dL (ref 7–25)
CO2: 29 mmol/L (ref 20–32)
Calcium: 10 mg/dL (ref 8.6–10.3)
Chloride: 104 mmol/L (ref 98–110)
Creat: 1.14 mg/dL (ref 0.60–1.35)
GFR, Est African American: 102 mL/min/{1.73_m2} (ref >=60)
GFR, Est Non African American: 88 mL/min/{1.73_m2} (ref >=60)
Globulin: 3.6 g/dL (calc) (ref 1.9–3.7)
Glucose, Bld: 85 mg/dL (ref 65–99)
Potassium: 4.4 mmol/L (ref 3.5–5.3)
Sodium: 138 mmol/L (ref 135–146)
Total Bilirubin: 0.9 mg/dL (ref 0.2–1.2)
Total Protein: 7.9 g/dL (ref 6.1–8.1)

## 2019-07-21 LAB — CBC WITH DIFFERENTIAL/PLATELET
Absolute Monocytes: 643 cells/uL (ref 200–950)
Basophils Absolute: 19 cells/uL (ref 0–200)
Basophils Relative: 0.3 %
Eosinophils Absolute: 202 cells/uL (ref 15–500)
Eosinophils Relative: 3.2 %
HCT: 46.4 % (ref 38.5–50.0)
Hemoglobin: 15.7 g/dL (ref 13.2–17.1)
Lymphs Abs: 1600 cells/uL (ref 850–3900)
MCH: 28.5 pg (ref 27.0–33.0)
MCHC: 33.8 g/dL (ref 32.0–36.0)
MCV: 84.4 fL (ref 80.0–100.0)
MPV: 9.9 fL (ref 7.5–12.5)
Monocytes Relative: 10.2 %
Neutro Abs: 3837 cells/uL (ref 1500–7800)
Neutrophils Relative %: 60.9 %
Platelets: 306 10*3/uL (ref 140–400)
RBC: 5.5 10*6/uL (ref 4.20–5.80)
RDW: 13.1 % (ref 11.0–15.0)
Total Lymphocyte: 25.4 %
WBC: 6.3 10*3/uL (ref 3.8–10.8)

## 2019-07-21 LAB — HIV-1 RNA QUANT-NO REFLEX-BLD
HIV 1 RNA Quant: 17500 copies/mL — ABNORMAL HIGH
HIV-1 RNA Quant, Log: 4.24 Log copies/mL — ABNORMAL HIGH

## 2019-07-21 LAB — RPR: RPR Ser Ql: NONREACTIVE

## 2019-08-03 ENCOUNTER — Encounter: Payer: Medicaid Other | Admitting: Internal Medicine

## 2019-08-15 ENCOUNTER — Other Ambulatory Visit: Payer: Self-pay

## 2019-08-15 ENCOUNTER — Ambulatory Visit (INDEPENDENT_AMBULATORY_CARE_PROVIDER_SITE_OTHER): Payer: Self-pay | Admitting: Internal Medicine

## 2019-08-15 ENCOUNTER — Encounter: Payer: Self-pay | Admitting: Internal Medicine

## 2019-08-15 DIAGNOSIS — F331 Major depressive disorder, recurrent, moderate: Secondary | ICD-10-CM

## 2019-08-15 DIAGNOSIS — Z23 Encounter for immunization: Secondary | ICD-10-CM

## 2019-08-15 DIAGNOSIS — F1910 Other psychoactive substance abuse, uncomplicated: Secondary | ICD-10-CM

## 2019-08-15 DIAGNOSIS — B182 Chronic viral hepatitis C: Secondary | ICD-10-CM

## 2019-08-15 DIAGNOSIS — B20 Human immunodeficiency virus [HIV] disease: Secondary | ICD-10-CM

## 2019-08-15 NOTE — Assessment & Plan Note (Signed)
That I believe he has been using heroin and cocaine to self medicate for his depression.  From information about how to get in touch with the counselors at [facility].

## 2019-08-15 NOTE — Assessment & Plan Note (Signed)
We have given him information about how to get in touch with the counselors at [facility].

## 2019-08-15 NOTE — Assessment & Plan Note (Signed)
Fortunately he has made a decision to come back into care.  When I asked him what made him come back he said "maybe time".  I asked him to do his best to not miss a single dose of Triumeq.  I will see him back after blood work in 4 weeks.  He received his influenza vaccine today.

## 2019-08-15 NOTE — Progress Notes (Signed)
[facility]  Patient Active Problem List   Diagnosis Date Noted  . HIV disease (HCC) 04/07/2016    Priority: High  . Chronic hepatitis C without hepatic coma (HCC) 11/19/2017    Priority: Medium  . IVDU (intravenous drug user)     Priority: Medium  . Polysubstance abuse (HCC) 04/07/2016    Priority: Medium  . Depression 08/14/2015    Priority: Medium  . ANXIETY 09/24/2010    Priority: Medium  . Severe recurrent major depression w/psychotic features, mood-congruent (HCC) 10/04/2018  . Transgender   . Pleural effusion, bacterial 11/23/2017  . Loculated pleural effusion 11/23/2017  . Septic pulmonary embolism (HCC) 11/19/2017  . Endocarditis, suspected 11/18/2017  . Cigarette smoker 04/07/2016  . Conversion disorder 08/14/2015  . Hemorrhoids, internal, with bleeding 08/25/2011  . Anal warts 08/25/2011  . ADHD 09/24/2010    Patient's Medications  New Prescriptions   No medications on file  Previous Medications   ABACAVIR-DOLUTEGRAVIR-LAMIVUDINE (TRIUMEQ) 600-50-300 MG TABLET    Take 1 tablet by mouth daily.   FLUOXETINE (PROZAC) 10 MG CAPSULE    Take 1 capsule (10 mg total) by mouth daily.   LOPERAMIDE (IMODIUM) 2 MG CAPSULE    Take 1 capsule (2 mg total) by mouth every 4 (four) hours as needed for diarrhea or loose stools.   OLANZAPINE (ZYPREXA) 10 MG TABLET    Take 1 tablet (10 mg total) by mouth at bedtime.   OMEGA-3 ACID ETHYL ESTERS (LOVAZA) 1 G CAPSULE    Take 1 capsule (1 g total) by mouth 2 (two) times daily.   ONDANSETRON (ZOFRAN) 4 MG TABLET    Take 1 tablet (4 mg total) by mouth every 6 (six) hours.   PRENATAL VIT-FE FUMARATE-FA (PRENATAL MULTIVITAMIN) TABS TABLET    Take 1 tablet by mouth daily at 12 noon.  Modified Medications   No medications on file  Discontinued Medications   No medications on file    Subjective: Pedro Owens is in for his first visit with me since June 2017.  He is accompanied by his mother.  Since his  last visit here he has had 2 inpatient drug treatment sessions in [place].  First time he was there for 54 days and then the second time he had to leave after 20 days because he lost insurance.  Unfortunately, neither stay helped him stay sober.  He continued to use IV heroin and smoked crack cocaine.  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  He says he has not injected drugs for about 2 months but continues to smoke crack cocaine.  He feels better after smoking cocaine but then comes down about 1 week later.  He says that he was supposed to get in touch with [facility] but does not know how to do this.  He has back on HMAP and restarted Triumeq 1 week ago.  He takes it each morning about 10 AM.  He is tolerating it well.  He has not missed any doses since he restarted it.  He is not on any other medications.  6 weeks  Review of Systems: Review of Systems  Constitutional: Positive for malaise/fatigue. Negative for chills, diaphoresis, fever and weight loss.  HENT: Negative for sore throat.   Respiratory: Negative for cough, sputum production  and shortness of breath.   Cardiovascular: Negative for chest pain.  Gastrointestinal: Negative for abdominal pain, diarrhea, heartburn, nausea and vomiting.  Genitourinary: Negative for dysuria and frequency.  Musculoskeletal: Negative for joint pain and myalgias.  Skin: Negative for rash.  Neurological: Negative for dizziness and headaches.  Psychiatric/Behavioral: Positive for depression and substance abuse. Negative for suicidal ideas. The patient is nervous/anxious.     Past Medical History:  Diagnosis Date  . ADHD (attention deficit hyperactivity disorder)   . Anal warts   . Anxiety   . Asthma   . Bipolar disorder (HCC)   . Chronic bronchitis (HCC)   . Chronic pain  syndrome   . Convulsions (HCC) 08/14/2015  . Depression   . Gonorrhea 09/08/11  . Headache    "weekly" (11/06/2016)  . Hemorrhoids, internal, with bleeding 08/25/2011  . Hepatitis C   . Herpes   . HIV infection (HCC)   . Hypertension   . ILEITIS 09/24/2010   Qualifier: Diagnosis of  By: Nelson-Smith CMA (AAMA), Dottie    . Lymphoid hyperplasia, reactive   . Marijuana abuse   . Migraine    "monthly" (11/06/2016)  . Pseudoseizures   . Seizures (HCC)    "don't know why I have them" (11/06/2016)  . Syphilis     Social History   Tobacco Use  . Smoking status: Current Every Day Smoker    Packs/day: 1.50    Years: 11.00    Pack years: 16.50    Types: Cigarettes  . Smokeless tobacco: Never Used  Substance Use Topics  . Alcohol use: Yes    Comment: 11/06/2016 "nothing in a very long time"  . Drug use: Yes    Frequency: 2.0 times per week    Types: Cocaine, Benzodiazepines, Oxycodone, Marijuana, Heroin    Comment: yesterday    Family History  Problem Relation Age of Onset  . Diabetes Mother   . Irritable bowel syndrome Mother   . Hypertension Mother   . Hyperlipidemia Mother   . Hypothyroidism Mother   . Diabetes Maternal Uncle   . Heart disease Maternal Grandmother   . Leukemia Other        maternal greatgrandmother  . Colon cancer Neg Hx     Allergies  Allergen Reactions  . Acetaminophen Diarrhea and Nausea And Vomiting    Flares up crohns disease Flares up crohns disease Flares up crohns disease  . Other Other (See Comments)    IV CONTRAST IV CONTRAST  . Vicodin [Hydrocodone-Acetaminophen] Nausea And Vomiting  . Dilaudid [Hydromorphone Hcl] Hives and Rash    Objective: Vitals:   08/15/19 1022  BP: 115/78  Pulse: (!) 59  Temp: 97.7 F (36.5 C)  TempSrc: Oral  SpO2: 100%  Weight: 175 lb (79.4 kg)   Body mass index is 24.07 kg/m.  Physical Exam Constitutional:      Comments: He is pleasant but very reserved.  HENT:     Mouth/Throat:     Pharynx:  No oropharyngeal exudate.  Eyes:     Conjunctiva/sclera: Conjunctivae normal.  Cardiovascular:     Rate and Rhythm: Normal rate and regular rhythm.     Heart sounds: No murmur.  Pulmonary:     Effort: Pulmonary effort is normal.     Breath sounds: Normal breath sounds.  Abdominal:     Palpations: Abdomen is soft. There is no mass.     Tenderness: There is no abdominal tenderness.  Musculoskeletal: Normal range of motion.  Skin:  Findings: No rash.  Neurological:     Mental Status: He is alert and oriented to person, place, and time.  Psychiatric:     Comments: Flat affect.     Lab Results HIV 1 RNA Quant (copies/mL)  Date Value  07/19/2019 17,500 (H)  04/19/2019 17,000 (H)  07/11/2018 20,400 (H)   CD4 T Cell Abs (/uL)  Date Value  07/19/2019 482  04/19/2019 398 (L)  05/30/2018 470   Hepatitis C viral load 11/19/2017: 2,730,000   Problem List Items Addressed This Visit      High   HIV disease ([place])    Fortunately he has made a decision to come back into care.  When I asked him what made him come back he said "maybe time".  I asked him to do his best to not miss a single dose of Triumeq.  I will see him back after blood work in 4 weeks.  He received his influenza vaccine today.      Relevant Orders   T-helper cell (CD4)- ([facility] clinic only)   HIV-1 RNA quant-no reflex-bld     Medium   Polysubstance abuse ([place])    We have given him information about how to get in touch with the counselors at [facility].      Depression    That I believe he has been using heroin and cocaine to self medicate for his depression.  From information about how to get in touch with the counselors at [facility].      Chronic hepatitis C without hepatic coma ([place])   Relevant Orders   Hepatitis C RNA quantitative   Liver Fibrosis, FibroTest-ActiTest   Protime-INR ( [facility] ONLY)       Michel Bickers, MD [facility] [phone] pager   [phone] cell 08/15/2019, 10:53 AM

## 2019-08-31 ENCOUNTER — Other Ambulatory Visit: Payer: Self-pay | Admitting: Internal Medicine

## 2019-08-31 DIAGNOSIS — B20 Human immunodeficiency virus [HIV] disease: Secondary | ICD-10-CM

## 2019-09-20 ENCOUNTER — Ambulatory Visit: Payer: Self-pay

## 2019-09-20 ENCOUNTER — Other Ambulatory Visit: Payer: Self-pay

## 2019-09-21 ENCOUNTER — Ambulatory Visit: Payer: Self-pay

## 2019-09-25 ENCOUNTER — Other Ambulatory Visit: Payer: Self-pay

## 2019-09-25 ENCOUNTER — Other Ambulatory Visit: Payer: Self-pay | Admitting: Cardiology

## 2019-09-25 ENCOUNTER — Other Ambulatory Visit: Payer: Self-pay | Admitting: Internal Medicine

## 2019-09-25 DIAGNOSIS — B20 Human immunodeficiency virus [HIV] disease: Secondary | ICD-10-CM

## 2019-09-25 DIAGNOSIS — Z20822 Contact with and (suspected) exposure to covid-19: Secondary | ICD-10-CM

## 2019-09-25 DIAGNOSIS — B182 Chronic viral hepatitis C: Secondary | ICD-10-CM

## 2019-09-26 LAB — T-HELPER CELL (CD4) - (RCID CLINIC ONLY)
CD4 % Helper T Cell: 31 % — ABNORMAL LOW (ref 33–65)
CD4 T Cell Abs: 563 /uL (ref 400–1790)

## 2019-09-26 LAB — NOVEL CORONAVIRUS, NAA: SARS-CoV-2, NAA: NOT DETECTED

## 2019-09-28 ENCOUNTER — Ambulatory Visit: Payer: Self-pay

## 2019-10-02 LAB — HIV-1 RNA QUANT-NO REFLEX-BLD
HIV 1 RNA Quant: 634 copies/mL — ABNORMAL HIGH
HIV-1 RNA Quant, Log: 2.8 Log copies/mL — ABNORMAL HIGH

## 2019-10-02 LAB — LIVER FIBROSIS, FIBROTEST-ACTITEST
ALT: 60 U/L — ABNORMAL HIGH (ref 9–46)
Alpha-2-Macroglobulin: 179 mg/dL (ref 106–279)
Apolipoprotein A1: 146 mg/dL (ref 94–176)
Bilirubin: 0.4 mg/dL (ref 0.2–1.2)
Fibrosis Score: 0.17
GGT: 102 U/L — ABNORMAL HIGH (ref 3–70)
Haptoglobin: 100 mg/dL (ref 43–212)
Necroinflammat ACT Score: 0.31
Reference ID: 3191025

## 2019-10-02 LAB — PROTIME-INR
INR: 1.1
Prothrombin Time: 11.4 s (ref 9.0–11.5)

## 2019-10-02 LAB — HEPATITIS C RNA QUANTITATIVE
HCV Quantitative Log: 6.74 Log IU/mL — ABNORMAL HIGH
HCV RNA, PCR, QN: 5450000 IU/mL — ABNORMAL HIGH

## 2019-10-11 ENCOUNTER — Other Ambulatory Visit: Payer: Self-pay

## 2019-10-11 ENCOUNTER — Ambulatory Visit (INDEPENDENT_AMBULATORY_CARE_PROVIDER_SITE_OTHER): Payer: Self-pay | Admitting: Internal Medicine

## 2019-10-11 ENCOUNTER — Encounter: Payer: Self-pay | Admitting: Internal Medicine

## 2019-10-11 DIAGNOSIS — B182 Chronic viral hepatitis C: Secondary | ICD-10-CM

## 2019-10-11 DIAGNOSIS — F1910 Other psychoactive substance abuse, uncomplicated: Secondary | ICD-10-CM

## 2019-10-11 DIAGNOSIS — B20 Human immunodeficiency virus [HIV] disease: Secondary | ICD-10-CM

## 2019-10-11 MED ORDER — BICTEGRAVIR-EMTRICITAB-TENOFOV 50-200-25 MG PO TABS: 1.0000 | ORAL_TABLET | Freq: Every day | ORAL | 11 refills | Status: DC

## 2019-10-11 NOTE — Assessment & Plan Note (Signed)
He will need treatment for his active chronic hepatitis C in the near future.  I am hopeful that he will stay in care this time and improve his adherence and decrease his drug use.

## 2019-10-11 NOTE — Assessment & Plan Note (Signed)
She has concerned that he may be using drugs more frequently if he is not working regularly.  We will double check and make sure that he has follow-up with our counselor.

## 2019-10-11 NOTE — Assessment & Plan Note (Signed)
His viral load has improved since his last visit but is not at goal yet. Reviewed treatment options and will switch him to [facility].  I encouraged him to not miss a single dose.  He will follow-up in 6 weeks.

## 2019-10-11 NOTE — Progress Notes (Signed)
Patient Active Problem List   Diagnosis Date Noted  . HIV disease (HCC) 04/07/2016    Priority: High  . Chronic hepatitis C without hepatic coma (HCC) 11/19/2017    Priority: Medium  . IVDU (intravenous drug user)     Priority: Medium  . Polysubstance abuse (HCC) 04/07/2016    Priority: Medium  . Depression 08/14/2015    Priority: Medium  . ANXIETY 09/24/2010    Priority: Medium  . Severe recurrent major depression w/psychotic features, mood-congruent (HCC) 10/04/2018  . Transgender   . Pleural effusion, bacterial 11/23/2017  . Loculated pleural effusion 11/23/2017  . Septic pulmonary embolism (HCC) 11/19/2017  . Endocarditis, suspected 11/18/2017  . Cigarette smoker 04/07/2016  . Conversion disorder 08/14/2015  . Hemorrhoids, internal, with bleeding 08/25/2011  . Anal warts 08/25/2011  . ADHD 09/24/2010    Patient's Medications  New Prescriptions   BICTEGRAVIR-EMTRICITABINE-TENOFOVIR AF (BIKTARVY) 50-200-25 MG TABS TABLET    Take 1 tablet by mouth daily.  Previous Medications   FLUOXETINE (PROZAC) 10 MG CAPSULE    Take 1 capsule (10 mg total) by mouth daily.   LOPERAMIDE (IMODIUM) 2 MG CAPSULE    Take 1 capsule (2 mg total) by mouth every 4 (four) hours as needed for diarrhea or loose stools.   OLANZAPINE (ZYPREXA) 10 MG TABLET    Take 1 tablet (10 mg total) by mouth at bedtime.   OMEGA-3 ACID ETHYL ESTERS (LOVAZA) 1 G CAPSULE    Take 1 capsule (1 g total) by mouth 2 (two) times daily.   ONDANSETRON (ZOFRAN) 4 MG TABLET    Take 1 tablet (4 mg total) by mouth every 6 (six) hours.   PRENATAL VIT-FE FUMARATE-FA (PRENATAL MULTIVITAMIN) TABS TABLET    Take 1 tablet by mouth daily at 12 noon.  Modified Medications   No medications on file  Discontinued Medications   TRIUMEQ 600-50-300 MG TABLET    TAKE 1 TABLET BY MOUTH DAILY    Subjective: Pedro Owens is in for his routine follow-up visit.  He says that he has not missed any doses of Triumeq.  His mother reminds him  to take it.  He says that it does consistently cause some mild nausea.  He also does not like how large the pill is and wants to know if he can take a smaller pill.  He lost his job last week.  He says that he expects that he will probably be using cocaine more frequently if he is not working regularly.  He says that he smokes cocaine about twice weekly recently.  He has not injected any drugs in several months.  He smokes about 3 packs of cigarettes daily.  It sounds like he had an appointment with our behavioral health counselor Mel Almond, several weeks ago but yesterday follow-up appointment with her last week because of work.  He thinks his mother has rescheduled.  Review of Systems: Review of Systems  Respiratory: Negative for cough.   Cardiovascular: Negative for chest pain.  Gastrointestinal: Positive for nausea. Negative for vomiting.  Psychiatric/Behavioral: Positive for depression and substance abuse. The patient is nervous/anxious.     Past Medical History:  Diagnosis Date  . ADHD (attention deficit hyperactivity disorder)   . Anal warts   . Anxiety   . Asthma   . Bipolar disorder (HCC)   . Chronic bronchitis (HCC)   . Chronic pain syndrome   . Convulsions (HCC) 08/14/2015  . Depression   .  Gonorrhea 09/08/11  . Headache    "weekly" (11/06/2016)  . Hemorrhoids, internal, with bleeding 08/25/2011  . Hepatitis C   . Herpes   . HIV infection (HCC)   . Hypertension   . ILEITIS 09/24/2010   Qualifier: Diagnosis of  By: Nelson-Smith CMA (AAMA), Dottie    . Lymphoid hyperplasia, reactive   . Marijuana abuse   . Migraine    "monthly" (11/06/2016)  . Pseudoseizures   . Seizures (HCC)    "don't know why I have them" (11/06/2016)  . Syphilis     Social History   Tobacco Use  . Smoking status: Current Every Day Smoker    Packs/day: 1.50    Years: 11.00    Pack years: 16.50    Types: Cigarettes  . Smokeless tobacco: Never Used  Substance Use Topics  . Alcohol use: Yes     Comment: 11/06/2016 "nothing in a very long time"  . Drug use: Yes    Frequency: 2.0 times per week    Types: Cocaine, Benzodiazepines, Oxycodone, Marijuana, Heroin    Comment: yesterday    Family History  Problem Relation Age of Onset  . Diabetes Mother   . Irritable bowel syndrome Mother   . Hypertension Mother   . Hyperlipidemia Mother   . Hypothyroidism Mother   . Diabetes Maternal Uncle   . Heart disease Maternal Grandmother   . Leukemia Other        maternal greatgrandmother  . Colon cancer Neg Hx     Allergies  Allergen Reactions  . Acetaminophen Diarrhea and Nausea And Vomiting    Flares up crohns disease Flares up crohns disease Flares up crohns disease  . Other Other (See Comments)    IV CONTRAST IV CONTRAST  . Vicodin [Hydrocodone-Acetaminophen] Nausea And Vomiting  . Dilaudid [Hydromorphone Hcl] Hives and Rash    Health Maintenance  Topic Date Due  . TETANUS/TDAP  05/11/2018  . INFLUENZA VACCINE  Completed  . HIV Screening  Completed    Objective:  Vitals:   10/11/19 1406  BP: 137/84  Pulse: 95  Temp: 98.4 F (36.9 C)  TempSrc: Oral  Weight: 167 lb (75.8 kg)   Body mass index is 22.97 kg/m.  Physical Exam Constitutional:      Comments: He is pleasant and talkative.  Cardiovascular:     Rate and Rhythm: Normal rate and regular rhythm.     Heart sounds: No murmur.  Pulmonary:     Effort: Pulmonary effort is normal.     Breath sounds: Normal breath sounds.  Abdominal:     Palpations: Abdomen is soft.     Tenderness: There is no abdominal tenderness.  Musculoskeletal:        General: No swelling or tenderness.  Skin:    Findings: No rash.  Neurological:     General: No focal deficit present.  Psychiatric:        Mood and Affect: Mood normal.     Lab Results Lab Results  Component Value Date   WBC 6.3 07/19/2019   HGB 15.7 07/19/2019   HCT 46.4 07/19/2019   MCV 84.4 07/19/2019   PLT 306 07/19/2019    Lab Results  Component  Value Date   CREATININE 1.14 07/19/2019   BUN 23 07/19/2019   NA 138 07/19/2019   K 4.4 07/19/2019   CL 104 07/19/2019   CO2 29 07/19/2019    Lab Results  Component Value Date   ALT 60 (H) 09/25/2019   AST 54 (  H) 07/19/2019   ALKPHOS 97 10/31/2018   BILITOT 0.9 07/19/2019    Lab Results  Component Value Date   CHOL 97 01/06/2017   HDL 39 (L) 01/06/2017   LDLCALC 35 01/06/2017   TRIG 114 01/06/2017   CHOLHDL 2.5 01/06/2017   Lab Results  Component Value Date   LABRPR NON-REACTIVE 07/19/2019   HIV 1 RNA Quant (copies/mL)  Date Value  09/25/2019 634 (H)  07/19/2019 17,500 (H)  04/19/2019 17,000 (H)   CD4 T Cell Abs (/uL)  Date Value  09/25/2019 563  07/19/2019 482  04/19/2019 398 (L)     Problem List Items Addressed This Visit      High   HIV disease ([place])    His viral load has improved since his last visit but is not at goal yet. Reviewed treatment options and will switch him to [place].  I encouraged him to not miss a single dose.  He will follow-up in 6 weeks.      Relevant Medications   bictegravir-emtricitabine-tenofovir AF (BIKTARVY) 50-200-25 MG TABS tablet     Medium   Polysubstance abuse ([place])    She has concerned that he may be using drugs more frequently if he is not working regularly.  We will double check and make sure that he has follow-up with our counselor.      Chronic hepatitis C without hepatic coma ([place])    He will need treatment for his active chronic hepatitis C in the near future.  I am hopeful that he will stay in care this time and improve his adherence and decrease his drug use.      Relevant Medications   bictegravir-emtricitabine-tenofovir AF (BIKTARVY) 50-200-25 MG TABS tablet        Michel Bickers, MD [facility] for [place] [phone] pager   [phone] cell 10/11/2019, 2:27 PM

## 2019-10-26 ENCOUNTER — Ambulatory Visit: Payer: Medicaid Other

## 2019-11-02 ENCOUNTER — Ambulatory Visit: Payer: Self-pay | Admitting: Medical

## 2019-11-06 ENCOUNTER — Other Ambulatory Visit: Payer: Medicaid Other

## 2019-11-06 ENCOUNTER — Other Ambulatory Visit: Payer: Self-pay | Admitting: *Deleted

## 2019-11-06 DIAGNOSIS — B20 Human immunodeficiency virus [HIV] disease: Secondary | ICD-10-CM

## 2019-11-09 ENCOUNTER — Other Ambulatory Visit: Payer: Medicaid Other

## 2019-11-09 ENCOUNTER — Ambulatory Visit: Payer: Medicaid Other

## 2019-11-09 ENCOUNTER — Other Ambulatory Visit: Payer: Self-pay

## 2019-11-09 DIAGNOSIS — B20 Human immunodeficiency virus [HIV] disease: Secondary | ICD-10-CM

## 2019-11-14 LAB — HIV-1 RNA QUANT-NO REFLEX-BLD
HIV 1 RNA Quant: 52400 copies/mL — ABNORMAL HIGH
HIV-1 RNA Quant, Log: 4.72 Log copies/mL — ABNORMAL HIGH

## 2019-11-27 ENCOUNTER — Encounter: Payer: Medicaid Other | Admitting: Internal Medicine

## 2019-12-07 ENCOUNTER — Ambulatory Visit: Payer: Medicaid Other

## 2019-12-14 ENCOUNTER — Ambulatory Visit (INDEPENDENT_AMBULATORY_CARE_PROVIDER_SITE_OTHER): Payer: Self-pay | Admitting: Internal Medicine

## 2019-12-14 ENCOUNTER — Encounter: Payer: Self-pay | Admitting: Internal Medicine

## 2019-12-14 ENCOUNTER — Ambulatory Visit: Payer: Self-pay

## 2019-12-14 ENCOUNTER — Other Ambulatory Visit: Payer: Self-pay

## 2019-12-14 VITALS — BP 160/98 | HR 80 | Temp 98.7°F | Ht 71.5 in | Wt 168.0 lb

## 2019-12-14 DIAGNOSIS — Z113 Encounter for screening for infections with a predominantly sexual mode of transmission: Secondary | ICD-10-CM

## 2019-12-14 DIAGNOSIS — F3340 Major depressive disorder, recurrent, in remission, unspecified: Secondary | ICD-10-CM

## 2019-12-14 DIAGNOSIS — A6000 Herpesviral infection of urogenital system, unspecified: Secondary | ICD-10-CM

## 2019-12-14 DIAGNOSIS — B182 Chronic viral hepatitis C: Secondary | ICD-10-CM

## 2019-12-14 DIAGNOSIS — B20 Human immunodeficiency virus [HIV] disease: Secondary | ICD-10-CM

## 2019-12-14 DIAGNOSIS — F1910 Other psychoactive substance abuse, uncomplicated: Secondary | ICD-10-CM

## 2019-12-14 MED ORDER — VALACYCLOVIR HCL 500 MG PO TABS: ORAL_TABLET | ORAL | 5 refills | Status: DC

## 2019-12-14 NOTE — Assessment & Plan Note (Signed)
I will repeat her CD4 and viral load today.  She will continue Biktarvy and follow-up in 6 weeks.

## 2019-12-14 NOTE — Assessment & Plan Note (Signed)
It looks like she has genital herpes superimposed on chronic genital warts.  I will treat with valacyclovir and screen for other STDs.

## 2019-12-14 NOTE — Assessment & Plan Note (Signed)
She is not ready for hepatitis C treatment yet.

## 2019-12-14 NOTE — Assessment & Plan Note (Signed)
She is doing better with drug use.

## 2019-12-14 NOTE — Assessment & Plan Note (Signed)
Her depression is in remission. 

## 2019-12-14 NOTE — Addendum Note (Signed)
Addended byJimmy Picket F on: 12/14/2019 02:47 PM   Modules accepted: Orders

## 2019-12-14 NOTE — Progress Notes (Signed)
Patient Active Problem List   Diagnosis Date Noted  . HIV disease (HCC) 04/07/2016    Priority: High  . Chronic hepatitis C without hepatic coma (HCC) 11/19/2017    Priority: Medium  . IVDU (intravenous drug user)     Priority: Medium  . Polysubstance abuse (HCC) 04/07/2016    Priority: Medium  . Depression 08/14/2015    Priority: Medium  . ANXIETY 09/24/2010    Priority: Medium  . Genital herpes 12/14/2019  . Severe recurrent major depression w/psychotic features, mood-congruent (HCC) 10/04/2018  . Transgender   . Pleural effusion, bacterial 11/23/2017  . Loculated pleural effusion 11/23/2017  . Septic pulmonary embolism (HCC) 11/19/2017  . Endocarditis, suspected 11/18/2017  . Cigarette smoker 04/07/2016  . Conversion disorder 08/14/2015  . Hemorrhoids, internal, with bleeding 08/25/2011  . Anal warts 08/25/2011  . ADHD 09/24/2010    Patient's Medications  New Prescriptions   VALACYCLOVIR (VALTREX) 500 MG TABLET    Take 1 tablet by mouth 2 times daily  Previous Medications   BICTEGRAVIR-EMTRICITABINE-TENOFOVIR AF (BIKTARVY) 50-200-25 MG TABS TABLET    Take 1 tablet by mouth daily.   FLUOXETINE (PROZAC) 10 MG CAPSULE    Take 1 capsule (10 mg total) by mouth daily.   OLANZAPINE (ZYPREXA) 10 MG TABLET    Take 1 tablet (10 mg total) by mouth at bedtime.   OMEGA-3 ACID ETHYL ESTERS (LOVAZA) 1 G CAPSULE    Take 1 capsule (1 g total) by mouth 2 (two) times daily.  Modified Medications   No medications on file  Discontinued Medications   LOPERAMIDE (IMODIUM) 2 MG CAPSULE    Take 1 capsule (2 mg total) by mouth every 4 (four) hours as needed for diarrhea or loose stools.   ONDANSETRON (ZOFRAN) 4 MG TABLET    Take 1 tablet (4 mg total) by mouth every 6 (six) hours.   PRENATAL VIT-FE FUMARATE-FA (PRENATAL MULTIVITAMIN) TABS TABLET    Take 1 tablet by mouth daily at 12 noon.    Subjective: Pedro Owens is in for her routine HIV follow-up visit.  She says that she has not  missed any doses of Biktarvy.  Her mother reminds her every day.  She is finished her first bottle.  She says she has cut down on smoking cocaine.  She has not injected any drugs in many months.  She continues to smoke cigarettes.  She says she is not feeling depressed.  She has developed some new lesions on her scrotum and around her rectum.  When she went into rehab she was told that she had genital herpes although she has never had an outbreak of lesions like this before.  Review of Systems: Review of Systems  Constitutional: Negative for chills, diaphoresis, fever, malaise/fatigue and weight loss.  HENT: Negative for sore throat.   Respiratory: Negative for cough, sputum production and shortness of breath.   Cardiovascular: Negative for chest pain.  Gastrointestinal: Negative for abdominal pain, diarrhea, heartburn, nausea and vomiting.  Genitourinary: Negative for dysuria and frequency.       As noted in HPI.  Musculoskeletal: Negative for joint pain and myalgias.  Skin: Positive for itching and rash.  Neurological: Negative for dizziness and headaches.  Psychiatric/Behavioral: Negative for depression and substance abuse. The patient is not nervous/anxious.     Past Medical History:  Diagnosis Date  . ADHD (attention deficit hyperactivity disorder)   . Anal warts   . Anxiety   . Asthma   .  Bipolar disorder (HCC)   . Chronic bronchitis (HCC)   . Chronic pain syndrome   . Convulsions (HCC) 08/14/2015  . Depression   . Gonorrhea 09/08/11  . Headache    "weekly" (11/06/2016)  . Hemorrhoids, internal, with bleeding 08/25/2011  . Hepatitis C   . Herpes   . HIV infection (HCC)   . Hypertension   . ILEITIS 09/24/2010   Qualifier: Diagnosis of  By: Nelson-Smith CMA (AAMA), Dottie    . Lymphoid hyperplasia, reactive   . Marijuana abuse   . Migraine    "monthly" (11/06/2016)  . Pseudoseizures   . Seizures (HCC)    "don't know why I have them" (11/06/2016)  . Syphilis     Social  History   Tobacco Use  . Smoking status: Current Every Day Smoker    Packs/day: 1.50    Years: 11.00    Pack years: 16.50    Types: Cigarettes  . Smokeless tobacco: Never Used  Substance Use Topics  . Alcohol use: Yes  . Drug use: Yes    Frequency: 2.0 times per week    Types: Cocaine, Benzodiazepines, Oxycodone, Marijuana, Heroin    Comment: yesterday    Family History  Problem Relation Age of Onset  . Diabetes Mother   . Irritable bowel syndrome Mother   . Hypertension Mother   . Hyperlipidemia Mother   . Hypothyroidism Mother   . Diabetes Maternal Uncle   . Heart disease Maternal Grandmother   . Leukemia Other        maternal greatgrandmother  . Colon cancer Neg Hx     Allergies  Allergen Reactions  . Acetaminophen Diarrhea and Nausea And Vomiting    Flares up crohns disease Flares up crohns disease Flares up crohns disease  . Other Other (See Comments)    IV CONTRAST IV CONTRAST  . Vicodin [Hydrocodone-Acetaminophen] Nausea And Vomiting  . Dilaudid [Hydromorphone Hcl] Hives and Rash    Health Maintenance  Topic Date Due  . TETANUS/TDAP  05/11/2018  . INFLUENZA VACCINE  Completed  . HIV Screening  Completed    Objective:  Vitals:   12/14/19 1406  BP: (!) 160/98  Pulse: 80  Temp: 98.7 F (37.1 C)  TempSrc: Oral  SpO2: 98%  Weight: 168 lb (76.2 kg)  Height: 5' 11.5" (1.816 m)   Body mass index is 23.1 kg/m.  Physical Exam Constitutional:      Comments: She is pleasant and in no distress.  Cardiovascular:     Rate and Rhythm: Normal rate and regular rhythm.     Heart sounds: No murmur.  Pulmonary:     Effort: Pulmonary effort is normal.     Breath sounds: Normal breath sounds.  Abdominal:     Palpations: Abdomen is soft.     Tenderness: There is no abdominal tenderness.  Skin:    Comments: He has multiple papular lesions on his scrotum some of which are ulcerated.  He has similar-appearing lesions around his rectum.  Neurological:      General: No focal deficit present.  Psychiatric:        Mood and Affect: Mood normal.     Lab Results Lab Results  Component Value Date   WBC 6.3 07/19/2019   HGB 15.7 07/19/2019   HCT 46.4 07/19/2019   MCV 84.4 07/19/2019   PLT 306 07/19/2019    Lab Results  Component Value Date   CREATININE 1.14 07/19/2019   BUN 23 07/19/2019   NA 138 07/19/2019  K 4.4 07/19/2019   CL 104 07/19/2019   CO2 29 07/19/2019    Lab Results  Component Value Date   ALT 60 (H) 09/25/2019   AST 54 (H) 07/19/2019   ALKPHOS 97 10/31/2018   BILITOT 0.9 07/19/2019    Lab Results  Component Value Date   CHOL 97 01/06/2017   HDL 39 (L) 01/06/2017   LDLCALC 35 01/06/2017   TRIG 114 01/06/2017   CHOLHDL 2.5 01/06/2017   Lab Results  Component Value Date   LABRPR NON-REACTIVE 07/19/2019   HIV 1 RNA Quant (copies/mL)  Date Value  11/09/2019 52,400 (H)  09/25/2019 634 (H)  07/19/2019 17,500 (H)   CD4 T Cell Abs (/uL)  Date Value  09/25/2019 563  07/19/2019 482  04/19/2019 398 (L)     Problem List Items Addressed This Visit      High   HIV disease ([place])    I will repeat her CD4 and viral load today.  She will continue Biktarvy and follow-up in 6 weeks.      Relevant Medications   valACYclovir (VALTREX) 500 MG tablet   Other Relevant Orders   T-helper cell (CD4)- ([facility] clinic only)   HIV-1 RNA quant-no reflex-bld   Urine cytology ancillary only   Cytology (oral, anal, urethral) ancillary only   Cytology (oral, anal, urethral) ancillary only     Medium   Polysubstance abuse ([place])    She is doing better with drug use.      Depression    Her depression is in remission.      Chronic hepatitis C without hepatic coma ([place])    She is not ready for hepatitis C treatment yet.      Relevant Medications   valACYclovir (VALTREX) 500 MG tablet     Unprioritized   Genital herpes    It looks like she has genital herpes superimposed on chronic genital warts.  I will treat with  valacyclovir and screen for other STDs.      Relevant Medications   valACYclovir (VALTREX) 500 MG tablet        Michel Bickers, MD [facility] for [facility] [place] [phone] pager   [phone] cell 12/14/2019, 2:38 PM

## 2019-12-14 NOTE — Addendum Note (Signed)
Addended by: Andree Coss on: 12/14/2019 02:46 PM   Modules accepted: Orders

## 2019-12-15 LAB — CYTOLOGY, (ORAL, ANAL, URETHRAL) ANCILLARY ONLY
Chlamydia: NEGATIVE
Chlamydia: NEGATIVE
Comment: NEGATIVE
Comment: NEGATIVE
Comment: NORMAL
Comment: NORMAL
Neisseria Gonorrhea: NEGATIVE
Neisseria Gonorrhea: NEGATIVE

## 2019-12-15 LAB — URINE CYTOLOGY ANCILLARY ONLY
Chlamydia: NEGATIVE
Comment: NEGATIVE
Comment: NORMAL
Neisseria Gonorrhea: NEGATIVE

## 2019-12-15 LAB — T-HELPER CELL (CD4) - (RCID CLINIC ONLY)
CD4 % Helper T Cell: 32 % — ABNORMAL LOW (ref 33–65)
CD4 T Cell Abs: 516 /uL (ref 400–1790)

## 2019-12-15 IMAGING — CR DG CHEST 2V
2 series · 2 of 2 positions shown · non-contrast
Comparison: Radiographs August 01, 2015.

CLINICAL DATA: Chest pain.

EXAM:
CHEST  2 VIEW

[chest lat]
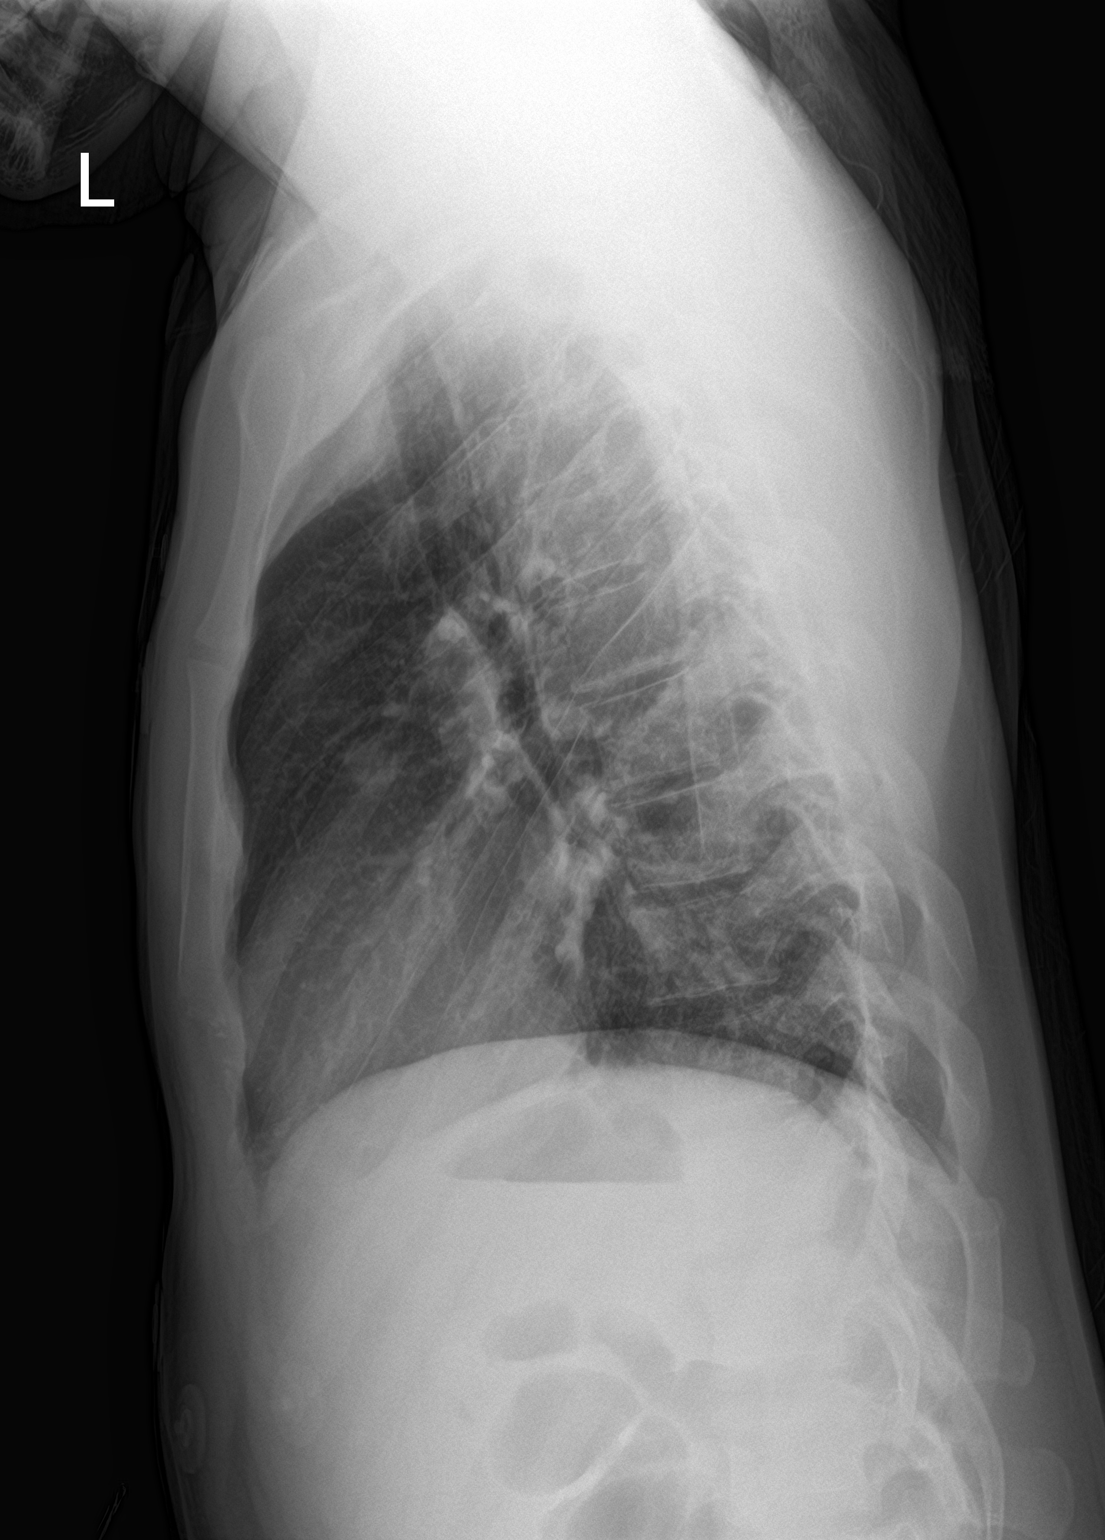

[chest ap]
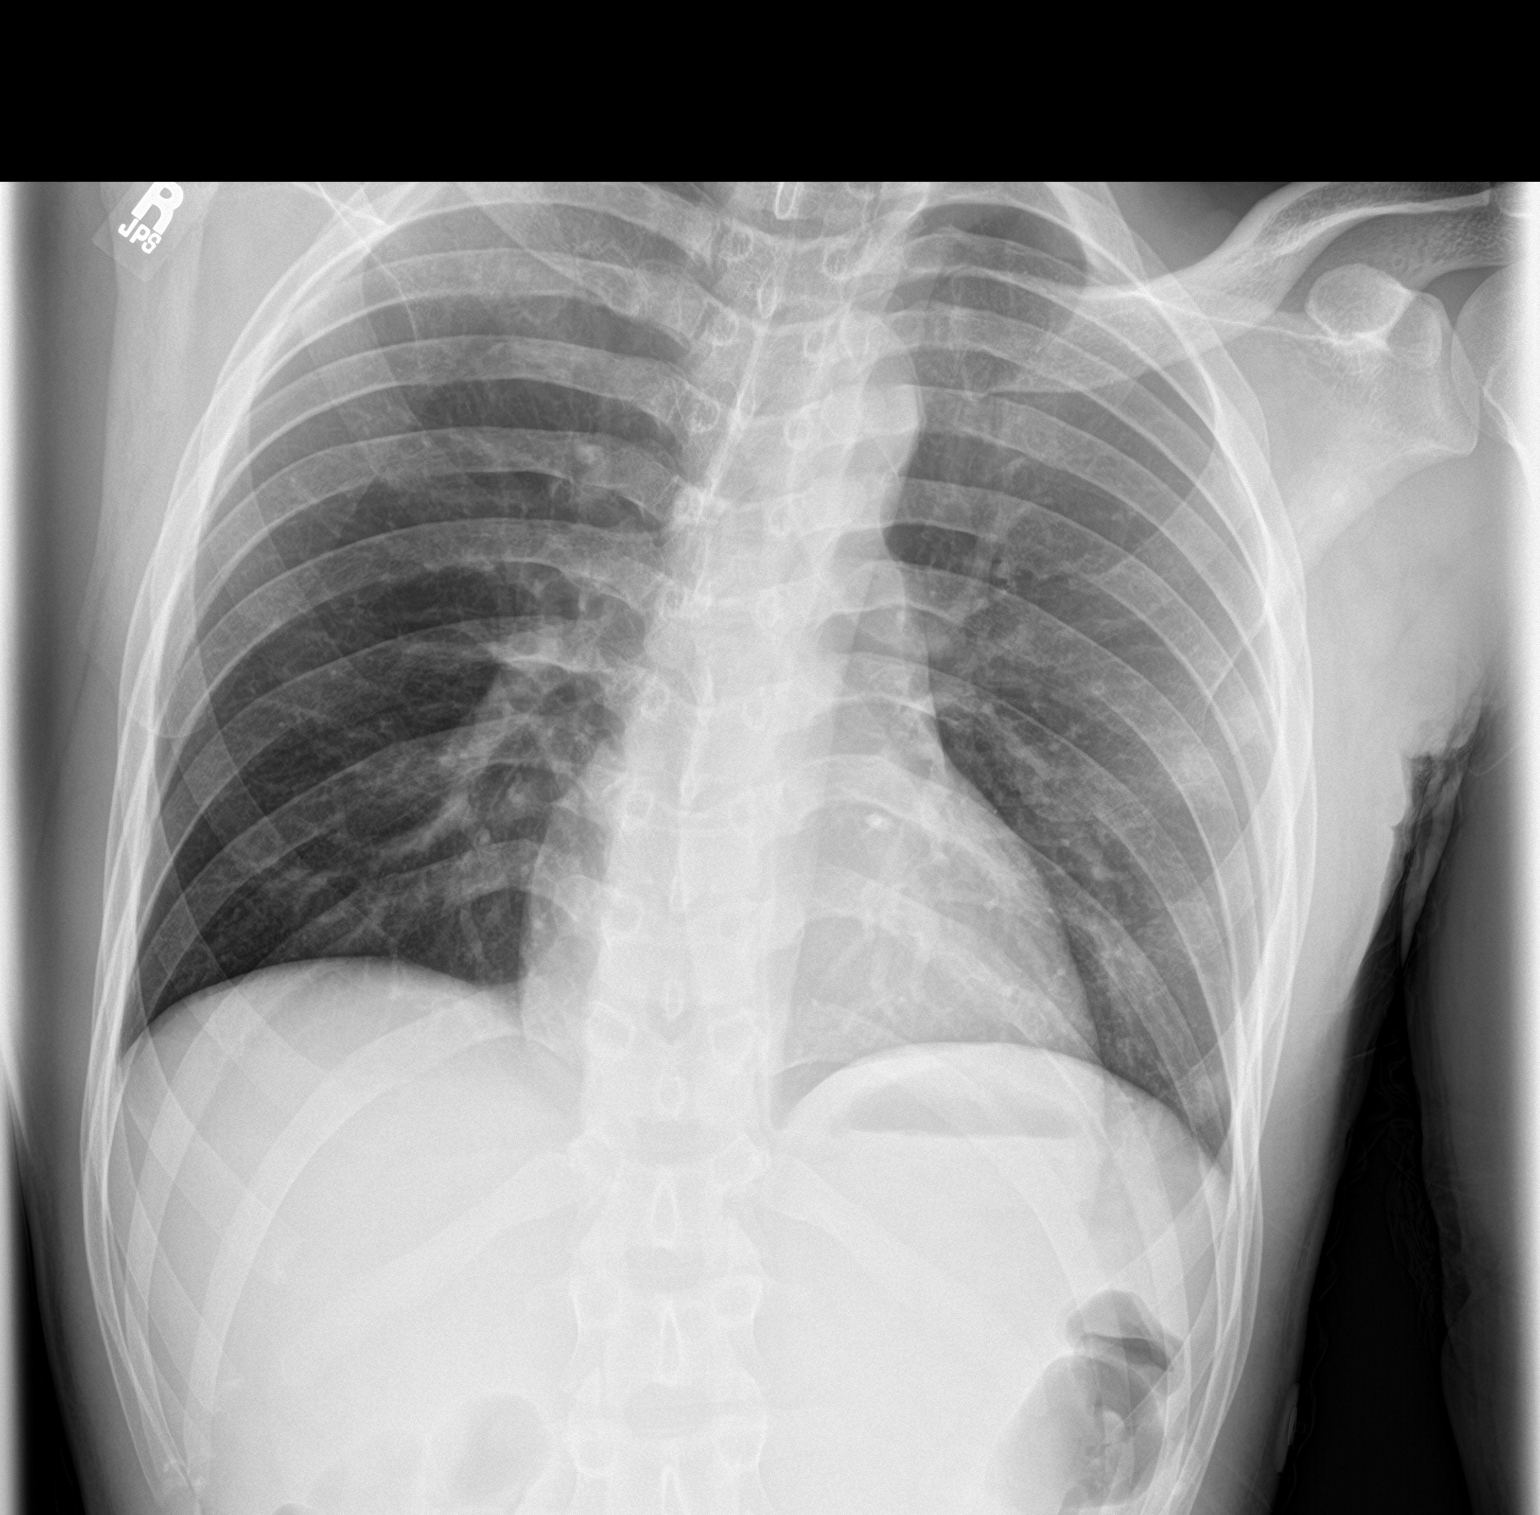

[2 of 2 positions shown; findings below may reference images not displayed]

FINDINGS: The heart size and mediastinal contours are within normal limits.
Both lungs are clear. No pneumothorax or pleural effusion is noted.
The visualized skeletal structures are unremarkable.
IMPRESSION: No active cardiopulmonary disease.

## 2019-12-18 ENCOUNTER — Telehealth: Payer: Self-pay

## 2019-12-18 ENCOUNTER — Encounter: Payer: Self-pay | Admitting: Internal Medicine

## 2019-12-18 DIAGNOSIS — A528 Late syphilis, latent: Secondary | ICD-10-CM | POA: Insufficient documentation

## 2019-12-18 DIAGNOSIS — A519 Early syphilis, unspecified: Secondary | ICD-10-CM | POA: Insufficient documentation

## 2019-12-18 NOTE — Progress Notes (Signed)
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

## 2019-12-18 NOTE — Telephone Encounter (Signed)
I called pateint and his Mother answered the phone, I left a message for patient to call our office, patient's Mother said she was calling because the valtrex was not working.   I asked for patient to call with more information.    I was calling patient to inform her of need for treatment due to positive RPR and for treatment.    Laurell Josephs, RN

## 2019-12-20 ENCOUNTER — Other Ambulatory Visit: Payer: Self-pay

## 2019-12-20 ENCOUNTER — Ambulatory Visit (INDEPENDENT_AMBULATORY_CARE_PROVIDER_SITE_OTHER): Payer: Self-pay | Admitting: *Deleted

## 2019-12-20 DIAGNOSIS — A519 Early syphilis, unspecified: Secondary | ICD-10-CM

## 2019-12-20 LAB — RPR: RPR Ser Ql: REACTIVE — AB

## 2019-12-20 LAB — HIV-1 RNA QUANT-NO REFLEX-BLD
HIV 1 RNA Quant: 253 copies/mL — ABNORMAL HIGH
HIV-1 RNA Quant, Log: 2.4 Log copies/mL — ABNORMAL HIGH

## 2019-12-20 LAB — FLUORESCENT TREPONEMAL AB(FTA)-IGG-BLD: Fluorescent Treponemal ABS: REACTIVE — AB

## 2019-12-20 LAB — RPR TITER: RPR Titer: 1:1024 {titer} — ABNORMAL HIGH

## 2019-12-20 MED ORDER — PENICILLIN G BENZATHINE 1200000 UNIT/2ML IM SUSP
1.2000 10*6.[IU] | Freq: Once | INTRAMUSCULAR | Status: AC
  Administered 2019-12-20: 1.2 10*6.[IU] via INTRAMUSCULAR

## 2019-12-20 NOTE — Progress Notes (Signed)
RN offered condoms, advised patient to remain abstinent for 7-10 days after treatme nt, and that the health department would be following up with her to confirm treatment. Patient's questions answered to their satisfaction.  Patient waited for 15 minutes in the lobby after her injections, as it was her first injection of bicillin. Andree Coss, RN

## 2019-12-20 NOTE — Telephone Encounter (Signed)
Patient received her bicillin injections as scheduled 12/20/19. Andree Coss, RN

## 2019-12-21 ENCOUNTER — Ambulatory Visit: Payer: Medicaid Other

## 2020-01-04 ENCOUNTER — Ambulatory Visit: Payer: Medicaid Other

## 2020-01-25 ENCOUNTER — Ambulatory Visit: Payer: Medicaid Other

## 2020-01-30 ENCOUNTER — Encounter: Payer: Self-pay | Admitting: Infectious Diseases

## 2020-02-06 ENCOUNTER — Ambulatory Visit (INDEPENDENT_AMBULATORY_CARE_PROVIDER_SITE_OTHER): Payer: Self-pay | Admitting: Internal Medicine

## 2020-02-06 ENCOUNTER — Ambulatory Visit: Payer: Self-pay

## 2020-02-06 ENCOUNTER — Other Ambulatory Visit: Payer: Self-pay

## 2020-02-06 ENCOUNTER — Encounter: Payer: Self-pay | Admitting: Internal Medicine

## 2020-02-06 DIAGNOSIS — F17210 Nicotine dependence, cigarettes, uncomplicated: Secondary | ICD-10-CM

## 2020-02-06 DIAGNOSIS — B182 Chronic viral hepatitis C: Secondary | ICD-10-CM

## 2020-02-06 DIAGNOSIS — F1910 Other psychoactive substance abuse, uncomplicated: Secondary | ICD-10-CM

## 2020-02-06 DIAGNOSIS — B20 Human immunodeficiency virus [HIV] disease: Secondary | ICD-10-CM

## 2020-02-06 DIAGNOSIS — F3340 Major depressive disorder, recurrent, in remission, unspecified: Secondary | ICD-10-CM

## 2020-02-06 MED ORDER — LEDIPASVIR-SOFOSBUVIR 90-400 MG PO TABS: ORAL_TABLET | ORAL | 2 refills | Status: AC

## 2020-02-06 NOTE — Assessment & Plan Note (Signed)
Her infection has come under much better control since starting antiretroviral therapy several months ago.  She will get repeat blood work at her visit in 4 weeks.

## 2020-02-06 NOTE — Progress Notes (Signed)
Patient Active Problem List   Diagnosis Date Noted  . 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 HIV disease ([place]) 04/07/2016    Priority: High  . Chronic hepatitis C without hepatic coma ([place]) 11/19/2017    Priority: Medium  . IVDU (intravenous drug user)     Priority: Medium  . Polysubstance abuse ([place]) 04/07/2016    Priority: Medium  . Depression 08/14/2015    Priority: Medium  . ANXIETY 09/24/2010    Priority: Medium  . Genital herpes 12/14/2019  . Severe recurrent major depression w/psychotic features, mood-congruent ([place]) 10/04/2018  . Transgender   . Pleural effusion, bacterial 11/23/2017  . Loculated pleural effusion 11/23/2017  . Septic pulmonary embolism ([place]) 11/19/2017  . Endocarditis, suspected 11/18/2017  . Cigarette smoker 04/07/2016  . Conversion disorder 08/14/2015  . Hemorrhoids, internal, with bleeding 08/25/2011  . Anal warts 08/25/2011  . ADHD 09/24/2010    Patient's Medications  New Prescriptions   LEDIPASVIR-SOFOSBUVIR (HARVONI) 90-400 MG TABS    Take 1 tablet by mouth daily for 90 days, THEN 1 tablet daily.  Previous Medications   BICTEGRAVIR-EMTRICITABINE-TENOFOVIR AF (BIKTARVY) 50-200-25 MG TABS TABLET    Take 1 tablet by mouth daily.   FLUOXETINE (PROZAC) 10 MG CAPSULE    Take 1 capsule (10 mg total) by mouth daily.   OLANZAPINE (ZYPREXA) 10 MG TABLET    Take 1 tablet (10 mg total) by mouth at bedtime.   OMEGA-3 ACID ETHYL ESTERS (LOVAZA) 1 G CAPSULE    Take 1 capsule (1 g total) by mouth 2 (two) times daily.   VALACYCLOVIR (VALTREX) 500 MG TABLET    Take 1 tablet by mouth 2 times daily  Modified Medications   No medications on file  Discontinued Medications   No medications on file    Subjective: Pedro Owens is in for her routine HIV and hepatitis C follow-up.  She states that she is feeling much better.  She met with our behavioral health counselor and has been in a drug treatment group and is now 30 days sober.  She is still  smoking about 3 packs of cigarettes daily.  She recalls missing only 2 doses of Biktarvy.  This occurred when she went out of town overnight and forgot to take her bottle.  Otherwise she has not missed any doses.  She is not feeling depressed or anxious.  She does want to get the Covid vaccine.  Her genital ulcers cleared up shortly after taking Val acyclovir following her last visit.  Review of Systems: Review of Systems  Constitutional: Negative for chills, diaphoresis, fever, malaise/fatigue and weight loss.  HENT: Negative for sore throat.   Respiratory: Negative for cough, sputum production and shortness of breath.   Cardiovascular: Negative for chest pain.  Gastrointestinal: Negative for abdominal pain, diarrhea, heartburn, nausea and vomiting.  Genitourinary: Negative for dysuria and frequency.  Musculoskeletal: Negative for joint pain and myalgias.  Skin: Negative for rash.  Neurological: Negative for dizziness and headaches.  Psychiatric/Behavioral: Negative for depression and substance abuse. The patient is not nervous/anxious.     Past Medical History:  Diagnosis Date  . ADHD (attention deficit hyperactivity disorder)   . Anal warts   . Anxiety   . Asthma   . Bipolar disorder ([place])   . Chronic bronchitis ([place])   . Chronic pain syndrome   . Convulsions ([place]) 08/14/2015  . Depression   . Gonorrhea 09/08/11  . Headache    "  weekly" (11/06/2016)  . Hemorrhoids, internal, with bleeding 08/25/2011  . Hepatitis C   . Herpes   . HIV infection ([place])   . Hypertension   . ILEITIS 09/24/2010   Qualifier: Diagnosis of  By: Nelson-Smith CMA (AAMA), Dottie    . Lymphoid hyperplasia, reactive   . Marijuana abuse   . Migraine    "monthly" (11/06/2016)  . Pseudoseizures   . Seizures ([place])    "don't know why I have them" (11/06/2016)  . Syphilis     Social History   Tobacco Use  . Smoking status: Current Every Day Smoker    Packs/day: 1.50    Years: 11.00    Pack years: 16.50      Types: Cigarettes  . Smokeless tobacco: Never Used  Substance Use Topics  . Alcohol use: Yes  . Drug use: Yes    Frequency: 2.0 times per week    Types: Cocaine, Benzodiazepines, Oxycodone, Marijuana, Heroin    Comment: yesterday    Family History  Problem Relation Age of Onset  . Diabetes Mother   . Irritable bowel syndrome Mother   . Hypertension Mother   . Hyperlipidemia Mother   . Hypothyroidism Mother   . Diabetes Maternal Uncle   . Heart disease Maternal Grandmother   . Leukemia Other        maternal greatgrandmother  . Colon cancer Neg Hx     Allergies  Allergen Reactions  . Acetaminophen Diarrhea and Nausea And Vomiting    Flares up crohns disease Flares up crohns disease Flares up crohns disease  . Other Other (See Comments)    IV CONTRAST IV CONTRAST  . Vicodin [Hydrocodone-Acetaminophen] Nausea And Vomiting  . Dilaudid [Hydromorphone Hcl] Hives and Rash    Health Maintenance  Topic Date Due  . COVID-19 Vaccine (1) Never done  . PAP-Cervical Cytology Screening  Never done  . PAP SMEAR-Modifier  Never done  . TETANUS/TDAP  05/11/2018  . INFLUENZA VACCINE  05/19/2020  . HIV Screening  Completed    Objective:  Vitals:   02/06/20 1146  BP: 127/85  Pulse: 70  Temp: 98.2 F (36.8 C)  SpO2: 95%  Weight: 185 lb (83.9 kg)  Height: '5\' 11"'$  (1.803 m)   Body mass index is 25.8 kg/m.  Physical Exam Constitutional:      Comments: She is in the best spirits I have ever seen her in.  Her weight is up 17 pounds since her last visit.  HENT:     Mouth/Throat:     Pharynx: No oropharyngeal exudate.  Eyes:     Conjunctiva/sclera: Conjunctivae normal.  Cardiovascular:     Rate and Rhythm: Normal rate and regular rhythm.     Heart sounds: No murmur.  Pulmonary:     Effort: Pulmonary effort is normal.     Breath sounds: Normal breath sounds.  Abdominal:     Palpations: Abdomen is soft. There is no mass.     Tenderness: There is no abdominal  tenderness.  Musculoskeletal:        General: Normal range of motion.  Skin:    Findings: No rash.  Neurological:     Mental Status: She is alert and oriented to person, place, and time.  Psychiatric:        Mood and Affect: Mood normal.     Comments: Good eye contact today.     Lab Results Lab Results  Component Value Date   WBC 6.3 07/19/2019   HGB 15.7 07/19/2019  HCT 46.4 07/19/2019   MCV 84.4 07/19/2019   PLT 306 07/19/2019    Lab Results  Component Value Date   CREATININE 1.14 07/19/2019   BUN 23 07/19/2019   NA 138 07/19/2019   K 4.4 07/19/2019   CL 104 07/19/2019   CO2 29 07/19/2019    Lab Results  Component Value Date   ALT 60 (H) 09/25/2019   AST 54 (H) 07/19/2019   ALKPHOS 97 10/31/2018   BILITOT 0.9 07/19/2019    Lab Results  Component Value Date   CHOL 97 01/06/2017   HDL 39 (L) 01/06/2017   LDLCALC 35 01/06/2017   TRIG 114 01/06/2017   CHOLHDL 2.5 01/06/2017   Lab Results  Component Value Date   LABRPR REACTIVE (A) 12/14/2019   RPRTITER 1:1,024 (H) 12/14/2019   HIV 1 RNA Quant (copies/mL)  Date Value  12/14/2019 253 (H)  11/09/2019 52,400 (H)  09/25/2019 634 (H)   CD4 T Cell Abs (/uL)  Date Value  12/14/2019 516  09/25/2019 563  07/19/2019 482     Problem List Items Addressed This Visit      High   HIV disease ([place])    Her infection has come under much better control since starting antiretroviral therapy several months ago.  She will get repeat blood work at her visit in 4 weeks.      Relevant Medications   Ledipasvir-Sofosbuvir (HARVONI) 90-400 MG TABS     Medium   Polysubstance abuse ([place])    I congratulated her on her sobriety.      Depression    Her depression is in remission coincident with getting off of cocaine.      Chronic hepatitis C without hepatic coma ([place])    She has chronic hepatitis C with genotype 1a.  Her fibrotest result was F0.  She is ready to start [place].  She will follow-up for repeat viral  load and 4 weeks.      Relevant Medications   Ledipasvir-Sofosbuvir (HARVONI) 90-400 MG TABS     Unprioritized   Cigarette smoker    She is currently ready to consider stopping or cutting down on cigarettes.           Michel Bickers, MD [facility] [place] [facility] [phone] pager   [phone] cell 02/06/2020, 12:16 PM

## 2020-02-06 NOTE — Assessment & Plan Note (Signed)
Her depression is in remission coincident with getting off of cocaine.

## 2020-02-06 NOTE — Assessment & Plan Note (Signed)
I congratulated her on her sobriety.

## 2020-02-06 NOTE — Assessment & Plan Note (Signed)
She has chronic hepatitis C with genotype 1a.  Her fibrotest result was F0.  She is ready to start Harvoni.  She will follow-up for repeat viral load and 4 weeks.

## 2020-02-06 NOTE — Assessment & Plan Note (Signed)
She is currently ready to consider stopping or cutting down on cigarettes.

## 2020-02-08 ENCOUNTER — Telehealth: Payer: Self-pay | Admitting: Pharmacy Technician

## 2020-02-08 NOTE — Telephone Encounter (Signed)
[facility] Patient Advocate Encounter  [facility] faxed an anticipated duration of treatment form to be filled out for the patient's treatment of Hepatitis C. He will be on Harvoni for 12 weeks and set to receive the first shipment on 02/09/2020. Will need to get him a follow-up appointment and medication counseling. The form has been completed and faxed back to [facility].

## 2020-02-29 ENCOUNTER — Telehealth: Payer: Self-pay

## 2020-02-29 ENCOUNTER — Other Ambulatory Visit: Payer: Self-pay | Admitting: Pharmacist

## 2020-02-29 DIAGNOSIS — R110 Nausea: Secondary | ICD-10-CM

## 2020-02-29 MED ORDER — ONDANSETRON 4 MG PO TBDP: 4.0000 mg | ORAL_TABLET | Freq: Three times a day (TID) | ORAL | 1 refills | Status: DC | PRN

## 2020-02-29 NOTE — Progress Notes (Signed)
Zofran for nausea

## 2020-02-29 NOTE — Telephone Encounter (Signed)
Received call today from [facility] stating patient is having issues tolerating Harvoni. Has been feeling nauseous since taking medication. Would like to know if office could send in prescription to help nausea. Patient is contemplating stopping Hep C treatment due to symptoms.  Lorenso Courier, [place]

## 2020-02-29 NOTE — Telephone Encounter (Signed)
Yes, I will send in Zofran to [facility] on [address] for her to take. Please tell her to take the Zofran 30 minutes before her Havoni. Also please tell her that taking the Harvoni with food may help the nausea and that it tends to get better the longer she takes it.

## 2020-03-11 ENCOUNTER — Encounter: Payer: Self-pay | Admitting: Pharmacy Technician

## 2020-03-11 ENCOUNTER — Telehealth: Payer: Self-pay | Admitting: Pharmacy Technician

## 2020-03-11 NOTE — Telephone Encounter (Signed)
[facility] Patient Advocate Encounter   Received notification from [employer] that prior authorization for Harvoni is required.   PA submitted on 03/11/2020 Key [id number] Status is pending [facility] [phone]  [facility] called to inform us the patient had a change in insurance mid treatment. They mailed one of his three refills so far. In talking with the patient it appears that he has missed several doses and has 22 tablets remaining.     [facility] will continue to follow at his appointment on 03/12/2020.   Beulah Gandy, CPhT Specialty Pharmacy Patient [facility] for Infectious Disease Phone: [phone] Fax: [phone] 03/11/2020 4:25 PM

## 2020-03-12 ENCOUNTER — Ambulatory Visit: Payer: Medicaid Other

## 2020-03-12 ENCOUNTER — Ambulatory Visit: Payer: Medicaid Other | Admitting: Pharmacist

## 2020-03-12 ENCOUNTER — Ambulatory Visit: Payer: Medicaid Other | Admitting: Internal Medicine

## 2020-03-12 NOTE — Telephone Encounter (Signed)
[facility] Patient Advocate Encounter  Received notification from [employer] that the request for prior authorization for Harvoni has been denied due to unmet criteria.     This determination will be reviewed today at the patient's appointment.   [employer] indicates that the patient must try and fail Epclusa or Vosevi or that the patient meets the criteria to use Harvoni. Documentation of treatment start date and only approved for 8 weeks. The previous insurance covered the first 4 weeks so this approval would only need to be 8 weeks to total the 12 week treatment.    This encounter will continue to be updated until final determination.    Beulah Gandy, CPhT Specialty Pharmacy Patient [facility] for Infectious Disease Phone: [phone] Fax: [phone] 03/12/2020 9:32 AM

## 2020-03-13 NOTE — Telephone Encounter (Signed)
[facility] Patient Advocate Encounter  Patient missed their appointment yesterday. Attempted to call and get the appointment rescheduled and find out additional information about the medication. Spoke with his mother and she has not seen or spoken to the patient to find out why he missed the appointment. She will have Pedro Owens give our office a call once she speaks to him.   [employer] was also updated on the status of the patient and the prior authorization. They said the Zofran is being shipped out today. He currently has 21 tablets which might mean he took one yesterday. Which means he took a few in April and a few in May for 7 total so far.

## 2020-03-14 NOTE — Telephone Encounter (Signed)
Update of [place].

## 2020-03-19 NOTE — Telephone Encounter (Signed)
[facility] Patient Advocate Encounter   Received notification from [employer] that prior authorization for Harvoni is required.   PA submitted on 03/19/2020 Key [id number] Status is pending, this is an attempt to get the prior authorization approved for 8 weeks instead of 12 weeks since the previous insurance covered the first four weeks.     [facility] will continue to follow.  Beulah Gandy, CPhT Specialty Pharmacy Patient [facility] for Infectious Disease Phone: [phone] Fax: [phone] 03/19/2020 2:58 PM

## 2020-03-25 NOTE — Telephone Encounter (Signed)
[facility] Patient Advocate Encounter  Received notification from [employer] that the request for prior authorization for Harvoni has been denied due to not meeting their specific criteria for approval. They want him to use Epclusa but prior insurance approved Harvoni which he began taking. Second authorization was sent for 8 weeks coverage.      This determination is currently being appealed by the pharmacist and will assess if patient shows for his appointment this week.   This encounter will continue to be updated until final determination from [employer] and appeal.    Beulah Gandy, CPhT Specialty Pharmacy Patient [facility] for Infectious Disease Phone: [phone] Fax: [phone] 03/25/2020 9:42 AM

## 2020-03-28 ENCOUNTER — Ambulatory Visit: Payer: Medicaid Other

## 2020-03-28 ENCOUNTER — Ambulatory Visit: Payer: Medicaid Other | Admitting: Internal Medicine

## 2020-03-28 ENCOUNTER — Ambulatory Visit: Payer: Medicaid Other | Admitting: Pharmacist

## 2020-03-28 ENCOUNTER — Ambulatory Visit (INDEPENDENT_AMBULATORY_CARE_PROVIDER_SITE_OTHER): Payer: Medicaid Other | Admitting: Podiatry

## 2020-03-28 DIAGNOSIS — Z5329 Procedure and treatment not carried out because of patient's decision for other reasons: Secondary | ICD-10-CM

## 2020-03-28 NOTE — Progress Notes (Signed)
No show for appt. 

## 2020-04-08 ENCOUNTER — Telehealth: Payer: Self-pay

## 2020-04-08 NOTE — Telephone Encounter (Signed)
[facility] pharmacy calling for updated status of Epclusa for this patient.  She was waiting on results of appeal.    Please advise.   Laurell Josephs, RN

## 2020-04-10 NOTE — Telephone Encounter (Signed)
We are stopping treatment. He has no showed Dr. Orvan Falconer several times and messed up taking his first month of Harvoni. Dr. Orvan Falconer prefers to not restart Hep C treatment at this time.

## 2020-04-15 ENCOUNTER — Telehealth: Payer: Self-pay

## 2020-04-15 NOTE — Telephone Encounter (Signed)
Pharmacist calling regarding Hep C treatment.  She was advised, Dr Orvan Falconer has decided to discontinue Hep C treatment at this time due to non compliance.   Laurell Josephs, RN

## 2020-04-23 ENCOUNTER — Ambulatory Visit: Payer: Medicaid Other | Admitting: Internal Medicine

## 2020-05-30 ENCOUNTER — Ambulatory Visit: Payer: Medicaid Other | Admitting: Pharmacist

## 2020-06-11 ENCOUNTER — Ambulatory Visit: Payer: Medicaid Other

## 2020-07-17 ENCOUNTER — Telehealth: Payer: Self-pay

## 2020-07-17 NOTE — Telephone Encounter (Signed)
Patient called office today stating she dropped her medication in soap water this morning. Is requesting assistance to get another 30 day supply. Last refilled on 9/24. Advised patient call [facility] for emergency refill. Will call office if she has any concerns. Lorenso Courier, [place]

## 2020-07-22 ENCOUNTER — Ambulatory Visit: Payer: Medicaid Other | Admitting: Pharmacist

## 2020-08-27 ENCOUNTER — Telehealth: Payer: Self-pay

## 2020-08-27 NOTE — Telephone Encounter (Signed)
Toniann Fail, [facility] case manager called requesting additional information on patient. Returned call and left VM requesting call back.  AmeLie Hollars Loyola Mast, RN

## 2020-09-26 ENCOUNTER — Ambulatory Visit: Payer: Medicaid Other

## 2020-09-26 ENCOUNTER — Ambulatory Visit: Payer: Medicaid Other | Admitting: Pharmacist

## 2020-10-10 ENCOUNTER — Other Ambulatory Visit: Payer: Self-pay

## 2020-10-10 ENCOUNTER — Telehealth: Payer: Self-pay

## 2020-10-10 ENCOUNTER — Ambulatory Visit: Payer: Self-pay

## 2020-10-10 DIAGNOSIS — B20 Human immunodeficiency virus [HIV] disease: Secondary | ICD-10-CM

## 2020-10-10 DIAGNOSIS — Z79899 Other long term (current) drug therapy: Secondary | ICD-10-CM

## 2020-10-10 DIAGNOSIS — A6000 Herpesviral infection of urogenital system, unspecified: Secondary | ICD-10-CM

## 2020-10-10 DIAGNOSIS — Z113 Encounter for screening for infections with a predominantly sexual mode of transmission: Secondary | ICD-10-CM

## 2020-10-10 MED ORDER — BICTEGRAVIR-EMTRICITAB-TENOFOV 50-200-25 MG PO TABS: 1.0000 | ORAL_TABLET | Freq: Every day | ORAL | 0 refills | Status: DC

## 2020-10-10 MED ORDER — VALACYCLOVIR HCL 500 MG PO TABS: ORAL_TABLET | ORAL | 1 refills | Status: DC

## 2020-10-10 NOTE — Telephone Encounter (Signed)
Yes, he can have refills of both and please scheduled him to see me next month. Thanks

## 2020-10-10 NOTE — Telephone Encounter (Signed)
Patient came in as a walk-in. Has been out of Biktarvy for 2 weeks, and is requesting refill for Valtrex. We will get labs today and schedule a follow-up with Dr. Orvan Falconer.   Sandie Ano, RN

## 2020-10-10 NOTE — Telephone Encounter (Signed)
Attempted to call patient, patient's mother answered and states that Zay told her to be expecting a call from [place] regarding the patient's medication. RN stated that medications have been refilled, but did not disclose any private health information, including the names of the medications. RN instructed the mother that the patient can call us if she has any questions.   Sandie Ano, RN

## 2020-10-10 NOTE — Addendum Note (Signed)
Addended by: Linna Hoff D on: 10/10/2020 03:01 PM   Modules accepted: Orders

## 2020-10-14 ENCOUNTER — Encounter: Payer: Self-pay | Admitting: Internal Medicine

## 2020-10-17 ENCOUNTER — Other Ambulatory Visit: Payer: Self-pay

## 2020-10-17 ENCOUNTER — Other Ambulatory Visit (HOSPITAL_COMMUNITY)
Admission: RE | Admit: 2020-10-17 | Discharge: 2020-10-17 | Disposition: A | Discharge status: Home / Self Care | Payer: Medicaid Other | Source: Ambulatory Visit | Attending: Internal Medicine | Admitting: Internal Medicine

## 2020-10-17 ENCOUNTER — Ambulatory Visit: Payer: Medicaid Other

## 2020-10-17 DIAGNOSIS — Z113 Encounter for screening for infections with a predominantly sexual mode of transmission: Secondary | ICD-10-CM

## 2020-10-17 DIAGNOSIS — Z79899 Other long term (current) drug therapy: Secondary | ICD-10-CM

## 2020-10-17 DIAGNOSIS — B20 Human immunodeficiency virus [HIV] disease: Secondary | ICD-10-CM

## 2020-10-17 LAB — T-HELPER CELL (CD4) - (RCID CLINIC ONLY)
CD4 % Helper T Cell: 21 % — ABNORMAL LOW (ref 33–65)
CD4 T Cell Abs: 262 /uL — ABNORMAL LOW (ref 400–1790)

## 2020-10-20 LAB — URINE CYTOLOGY ANCILLARY ONLY
Chlamydia: NEGATIVE
Comment: NEGATIVE
Comment: NORMAL
Neisseria Gonorrhea: NEGATIVE

## 2020-10-28 LAB — COMPREHENSIVE METABOLIC PANEL
AG Ratio: 0.8 (calc) — ABNORMAL LOW (ref 1.0–2.5)
ALT: 73 U/L — ABNORMAL HIGH (ref 9–46)
AST: 78 U/L — ABNORMAL HIGH (ref 10–40)
Albumin: 4 g/dL (ref 3.6–5.1)
Alkaline phosphatase (APISO): 67 U/L (ref 36–130)
BUN: 17 mg/dL (ref 7–25)
CO2: 25 mmol/L (ref 20–32)
Calcium: 9.6 mg/dL (ref 8.6–10.3)
Chloride: 102 mmol/L (ref 98–110)
Creat: 0.94 mg/dL (ref 0.60–1.35)
Globulin: 4.8 g/dL (calc) — ABNORMAL HIGH (ref 1.9–3.7)
Glucose, Bld: 87 mg/dL (ref 65–99)
Potassium: 3.8 mmol/L (ref 3.5–5.3)
Sodium: 137 mmol/L (ref 135–146)
Total Bilirubin: 0.8 mg/dL (ref 0.2–1.2)
Total Protein: 8.8 g/dL — ABNORMAL HIGH (ref 6.1–8.1)

## 2020-10-28 LAB — HIV-1 RNA QUANT-NO REFLEX-BLD
HIV 1 RNA Quant: 2750 Copies/mL — ABNORMAL HIGH
HIV-1 RNA Quant, Log: 3.44 Log cps/mL — ABNORMAL HIGH

## 2020-10-28 LAB — CBC WITH DIFFERENTIAL/PLATELET
Absolute Monocytes: 561 cells/uL (ref 200–950)
Basophils Absolute: 19 cells/uL (ref 0–200)
Basophils Relative: 0.3 %
Eosinophils Absolute: 82 cells/uL (ref 15–500)
Eosinophils Relative: 1.3 %
HCT: 46 % (ref 38.5–50.0)
Hemoglobin: 16 g/dL (ref 13.2–17.1)
Lymphs Abs: 1285 cells/uL (ref 850–3900)
MCH: 29.4 pg (ref 27.0–33.0)
MCHC: 34.8 g/dL (ref 32.0–36.0)
MCV: 84.6 fL (ref 80.0–100.0)
MPV: 10.6 fL (ref 7.5–12.5)
Monocytes Relative: 8.9 %
Neutro Abs: 4353 cells/uL (ref 1500–7800)
Neutrophils Relative %: 69.1 %
Platelets: 291 10*3/uL (ref 140–400)
RBC: 5.44 10*6/uL (ref 4.20–5.80)
RDW: 12.7 % (ref 11.0–15.0)
Total Lymphocyte: 20.4 %
WBC: 6.3 10*3/uL (ref 3.8–10.8)

## 2020-10-28 LAB — RPR: RPR Ser Ql: REACTIVE — AB

## 2020-10-28 LAB — LIPID PANEL
Cholesterol: 160 mg/dL (ref <200)
HDL: 51 mg/dL (ref >=40)
LDL Cholesterol (Calc): 86 mg/dL (calc)
Non-HDL Cholesterol (Calc): 109 mg/dL (calc) (ref <130)
Total CHOL/HDL Ratio: 3.1 (calc) (ref <5.0)
Triglycerides: 130 mg/dL (ref <150)

## 2020-10-28 LAB — RPR TITER: RPR Titer: 1:2048 {titer} — ABNORMAL HIGH

## 2020-10-28 LAB — FLUORESCENT TREPONEMAL AB(FTA)-IGG-BLD: Fluorescent Treponemal ABS: REACTIVE — AB

## 2020-10-31 ENCOUNTER — Ambulatory Visit: Payer: Medicaid Other | Admitting: Internal Medicine

## 2020-11-02 ENCOUNTER — Other Ambulatory Visit: Payer: Self-pay | Admitting: Internal Medicine

## 2020-11-02 DIAGNOSIS — B20 Human immunodeficiency virus [HIV] disease: Secondary | ICD-10-CM

## 2020-11-05 ENCOUNTER — Encounter: Payer: Self-pay | Admitting: Internal Medicine

## 2020-11-05 ENCOUNTER — Ambulatory Visit (INDEPENDENT_AMBULATORY_CARE_PROVIDER_SITE_OTHER): Payer: Self-pay | Admitting: Internal Medicine

## 2020-11-05 ENCOUNTER — Other Ambulatory Visit: Payer: Self-pay

## 2020-11-05 VITALS — BP 125/85 | HR 73 | Temp 98.4°F | Ht 71.0 in | Wt 172.0 lb

## 2020-11-05 DIAGNOSIS — A519 Early syphilis, unspecified: Secondary | ICD-10-CM

## 2020-11-05 DIAGNOSIS — F331 Major depressive disorder, recurrent, moderate: Secondary | ICD-10-CM

## 2020-11-05 DIAGNOSIS — B20 Human immunodeficiency virus [HIV] disease: Secondary | ICD-10-CM

## 2020-11-05 DIAGNOSIS — Z23 Encounter for immunization: Secondary | ICD-10-CM

## 2020-11-05 MED ORDER — PENICILLIN G BENZATHINE 1200000 UNIT/2ML IM SUSP
1.2000 10*6.[IU] | Freq: Once | INTRAMUSCULAR | Status: AC
  Administered 2020-11-05: 1.2 10*6.[IU] via INTRAMUSCULAR

## 2020-11-05 NOTE — Progress Notes (Signed)
Patient tolerated Bicillin injections well. Reinforced abstinence for 10 days after treatment, offered condoms and encouraged use. Patient verbalized understanding.   Sian Rockers D Lyell Clugston, RN   

## 2020-11-05 NOTE — Addendum Note (Signed)
Addended by: Linna Hoff D on: 11/05/2020 04:42 PM   Modules accepted: Orders

## 2020-11-05 NOTE — Progress Notes (Signed)
Patient Active Problem List   Diagnosis Date Noted  . 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 HIV disease (HCC) 04/07/2016    Priority: High  . Chronic hepatitis C without hepatic coma (HCC) 11/19/2017    Priority: Medium  . IVDU (intravenous drug user)     Priority: Medium  . Polysubstance abuse (H[facility]) 04/07/2016    Priority: Medium  . Depression 08/14/2015    Priority: Medium  . ANXIETY 09/24/2010    Priority: Medium  . Genital herpes 12/14/2019  . Severe recurrent major depression w/psychotic features, mood-congruent (HCC) 10/04/2018  . Transgender   . Pleural effusion, bacterial 11/23/2017  . Loculated pleural effusion 11/23/2017  . Septic pulmonary embolism (HCC) 11/19/2017  . Endocarditis, suspected 11/18/2017  . Cigarette smoker 04/07/2016  . Conversion disorder 08/14/2015  . Hemorrhoids, internal, with bleeding 08/25/2011  . Anal warts 08/25/2011  . ADHD 09/24/2010    Patient's Medications  New Prescriptions   No medications on file  Previous Medications   AMOXICILLIN (AMOXIL) 500 MG CAPSULE    Prior to dental appointments   BICTEGRAVIR-EMTRICITABINE-TENOFOVIR AF (BIKTARVY) 50-200-25 MG TABS TABLET    Take 1 tablet by mouth daily.   FLUOXETINE (PROZAC) 10 MG CAPSULE    Take 1 capsule (10 mg total) by mouth daily.   OLANZAPINE (ZYPREXA) 10 MG TABLET    Take 1 tablet (10 mg total) by mouth at bedtime.   OMEGA-3 ACID ETHYL ESTERS (LOVAZA) 1 G CAPSULE    Take 1 capsule (1 g total) by mouth 2 (two) times daily.   ONDANSETRON (ZOFRAN ODT) 4 MG DISINTEGRATING TABLET    Take 1 tablet (4 mg total) by mouth every 8 (eight) hours as needed for nausea or vomiting.   VALACYCLOVIR (VALTREX) 500 MG TABLET    Take 1 tablet by mouth 2 times daily  Modified Medications   No medications on file  Discontinued Medications   No medications on file    Subjective: Pedro Owens is in for her routine HIV follow-up visit. She says that several months ago her mother moved and  she no longer had a place to stay. She was living in a motel for 3 months. Her Susanne Borders was being mailed to her mother's old address so she could not get the medication. She did not let us know. She is now back living with her mother at her new address and has had the [facility] for several weeks. She does not think she has missed since she got her refill. She has been feeling depressed and anxious as usual. He says this is no worse than normal. She has been using cocaine intermittently but has not been injecting drugs. She says that she was recently offered a job at [employer] but does not know when she will start.  Review of Systems: Review of Systems  Constitutional: Negative for fever, malaise/fatigue and weight loss.  Respiratory: Negative for cough and shortness of breath.   Cardiovascular: Negative for chest pain.  Gastrointestinal: Negative for diarrhea, nausea and vomiting.  Psychiatric/Behavioral: Positive for depression. The patient is nervous/anxious.     Past Medical History:  Diagnosis Date  . ADHD (attention deficit hyperactivity disorder)   . Anal warts   . Anxiety   . Asthma   . Bipolar disorder (HCC)   . Chronic bronchitis (HCC)   . Chronic pain syndrome   . Convulsions (HCC) 08/14/2015  . Depression   . Gonorrhea 09/08/11  .  Headache    "weekly" (11/06/2016)  . Hemorrhoids, internal, with bleeding 08/25/2011  . Hepatitis C   . Herpes   . HIV infection (HCC)   . Hypertension   . ILEITIS 09/24/2010   Qualifier: Diagnosis of  By: Nelson-Smith CMA (AAMA), Dottie    . Lymphoid hyperplasia, reactive   . Marijuana abuse   . Migraine    "monthly" (11/06/2016)  . Pseudoseizures (HCC)   . Seizures (HCC)    "don't know why I have them" (11/06/2016)  . Syphilis     Social History   Tobacco Use  . Smoking status: Current Every Day Smoker    Packs/day: 1.50    Years: 11.00    Pack years: 16.50    Types: Cigarettes  . Smokeless tobacco: Never Used  Vaping Use  . Vaping  Use: Never used  Substance Use Topics  . Alcohol use: Yes    Comment: occ  . Drug use: Yes    Frequency: 2.0 times per week    Types: Cocaine, Benzodiazepines, Oxycodone, Marijuana, Heroin    Comment: "just marijuana"     Family History  Problem Relation Age of Onset  . Diabetes Mother   . Irritable bowel syndrome Mother   . Hypertension Mother   . Hyperlipidemia Mother   . Hypothyroidism Mother   . Diabetes Maternal Uncle   . Heart disease Maternal Grandmother   . Leukemia Other        maternal greatgrandmother  . Colon cancer Neg Hx     Allergies  Allergen Reactions  . Acetaminophen Diarrhea and Nausea And Vomiting    Flares up crohns disease Flares up crohns disease Flares up crohns disease  . Other Other (See Comments)    IV CONTRAST IV CONTRAST  . Vicodin [Hydrocodone-Acetaminophen] Nausea And Vomiting  . Dilaudid [Hydromorphone Hcl] Hives and Rash    Health Maintenance  Topic Date Due  . COVID-19 Vaccine (1) Never done  . PAP-Cervical Cytology Screening  Never done  . PAP SMEAR-Modifier  Never done  . TETANUS/TDAP  05/11/2018  . INFLUENZA VACCINE  05/19/2020  . Hepatitis C Screening  Completed  . HIV Screening  Completed    Objective:  Vitals:   11/05/20 1551  BP: 125/85  Pulse: 73  Temp: 98.4 F (36.9 C)  TempSrc: Oral  SpO2: 99%  Weight: 172 lb (78 kg)  Height: 5\' 11"  (1.803 m)   Body mass index is 23.99 kg/m.  Physical Exam Constitutional:      Comments: She is in no distress.  Cardiovascular:     Rate and Rhythm: Normal rate and regular rhythm.     Heart sounds: No murmur heard.   Pulmonary:     Effort: Pulmonary effort is normal.     Breath sounds: Normal breath sounds.  Abdominal:     Palpations: Abdomen is soft.     Tenderness: There is no abdominal tenderness.  Musculoskeletal:        General: No swelling or tenderness.  Skin:    Findings: No rash.  Neurological:     General: No focal deficit present.  Psychiatric:         Mood and Affect: Mood normal.     Lab Results Lab Results  Component Value Date   WBC 6.3 10/17/2020   HGB 16.0 10/17/2020   HCT 46.0 10/17/2020   MCV 84.6 10/17/2020   PLT 291 10/17/2020    Lab Results  Component Value Date   CREATININE 0.94 10/17/2020  BUN 17 10/17/2020   NA 137 10/17/2020   K 3.8 10/17/2020   CL 102 10/17/2020   CO2 25 10/17/2020    Lab Results  Component Value Date   ALT 73 (H) 10/17/2020   AST 78 (H) 10/17/2020   GGT 102 (H) 09/25/2019   ALKPHOS 97 10/31/2018   BILITOT 0.8 10/17/2020    Lab Results  Component Value Date   CHOL 160 10/17/2020   HDL 51 10/17/2020   LDLCALC 86 10/17/2020   TRIG 130 10/17/2020   CHOLHDL 3.1 10/17/2020   Lab Results  Component Value Date   LABRPR REACTIVE (A) 10/17/2020   RPRTITER 1:2,048 (H) 10/17/2020   HIV 1 RNA Quant  Date Value  10/17/2020 2,750 Copies/mL (H)  12/14/2019 253 copies/mL (H)  11/09/2019 52,400 copies/mL (H)   CD4 T Cell Abs (/uL)  Date Value  10/17/2020 262 (L)  12/14/2019 516  09/25/2019 563     Problem List Items Addressed This Visit      High   HIV disease (HCC)    Not surprisingly, given her recent problems obtaining her Biktarvy, her viral load is elevated. I reminded her that she should inform us anytime she has trouble obtaining, taking or tolerating her medication and she should do it right away. She will get an influenza vaccine here today and follow-up for repeat lab work in 4 weeks. She got her initial dose of a COVID-vaccine (? Moderna versus Pfizer).      Early syphilis    Her RPR has doubled since treatment for early syphilis 1 year ago. 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        Medium   Depression    We will arrange an appointment to follow-up with our behavioral health counselor.           Cliffton Asters, MD [facility] for Infectious Disease [facility] [phone] pager   [phone] cell 11/05/2020, 4:31  PM

## 2020-11-05 NOTE — Assessment & Plan Note (Signed)
Not surprisingly, given her recent problems obtaining her Biktarvy, her viral load is elevated. I reminded her that she should inform us anytime she has trouble obtaining, taking or tolerating her medication and she should do it right away. She will get an influenza vaccine here today and follow-up for repeat lab work in 4 weeks. She got her initial dose of a COVID-vaccine (? Moderna versus Pfizer).

## 2020-11-05 NOTE — Assessment & Plan Note (Signed)
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

## 2020-11-05 NOTE — Assessment & Plan Note (Signed)
We will arrange an appointment to follow-up with our behavioral health counselor.

## 2020-11-07 ENCOUNTER — Ambulatory Visit: Payer: Medicaid Other

## 2020-11-09 ENCOUNTER — Other Ambulatory Visit: Payer: Self-pay | Admitting: Internal Medicine

## 2020-11-09 DIAGNOSIS — B20 Human immunodeficiency virus [HIV] disease: Secondary | ICD-10-CM

## 2020-11-12 ENCOUNTER — Ambulatory Visit: Payer: Medicaid Other

## 2020-11-19 ENCOUNTER — Ambulatory Visit: Payer: Medicaid Other

## 2020-12-04 ENCOUNTER — Ambulatory Visit: Payer: Medicaid Other | Admitting: Internal Medicine

## 2020-12-20 ENCOUNTER — Telehealth: Payer: Self-pay | Admitting: *Deleted

## 2020-12-20 NOTE — Telephone Encounter (Signed)
Bridge counselor called to follow up on Zay. She will try to track down Zay due to being out of care and her untreated  Syphilis. Andree Coss, RN

## 2021-01-15 ENCOUNTER — Telehealth: Payer: Self-pay

## 2021-01-15 NOTE — Telephone Encounter (Signed)
Left patient a voice mail to call back to schedule missed appointment with Dr.Campbell, patient prefers morning appointments between Monday and Thursday

## 2021-02-12 ENCOUNTER — Ambulatory Visit: Payer: Medicaid Other | Admitting: Internal Medicine

## 2021-03-04 ENCOUNTER — Ambulatory Visit: Payer: Medicaid Other | Admitting: Internal Medicine

## 2021-03-12 ENCOUNTER — Other Ambulatory Visit: Payer: Self-pay

## 2021-03-12 ENCOUNTER — Ambulatory Visit (INDEPENDENT_AMBULATORY_CARE_PROVIDER_SITE_OTHER): Payer: Self-pay | Admitting: Pharmacist

## 2021-03-12 ENCOUNTER — Encounter: Payer: Self-pay | Admitting: Internal Medicine

## 2021-03-12 ENCOUNTER — Ambulatory Visit: Payer: Self-pay

## 2021-03-12 ENCOUNTER — Ambulatory Visit: Payer: Medicaid Other | Admitting: Internal Medicine

## 2021-03-12 DIAGNOSIS — Z79899 Other long term (current) drug therapy: Secondary | ICD-10-CM

## 2021-03-12 DIAGNOSIS — B20 Human immunodeficiency virus [HIV] disease: Secondary | ICD-10-CM

## 2021-03-12 DIAGNOSIS — Z113 Encounter for screening for infections with a predominantly sexual mode of transmission: Secondary | ICD-10-CM

## 2021-03-12 NOTE — Progress Notes (Signed)
HPI: Pedro Owens is a 30 y.o. adult who presents to the [facility] pharmacy clinic for HIV follow-up.  Patient Active Problem List   Diagnosis Date Noted  . 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 Genital herpes 12/14/2019  . Severe recurrent major depression w/psychotic features, mood-congruent (HCC) 10/04/2018  . Transgender   . Pleural effusion, bacterial 11/23/2017  . Loculated pleural effusion 11/23/2017  . Chronic hepatitis C without hepatic coma (HCC) 11/19/2017  . Septic pulmonary embolism (HCC) 11/19/2017  . IVDU (intravenous drug user)   . Endocarditis, suspected 11/18/2017  . HIV disease (HCC) 04/07/2016  . Cigarette smoker 04/07/2016  . Polysubstance abuse (HCC) 04/07/2016  . Depression 08/14/2015  . Conversion disorder 08/14/2015  . Hemorrhoids, internal, with bleeding 08/25/2011  . Anal warts 08/25/2011  . ANXIETY 09/24/2010  . ADHD 09/24/2010    Patient's Medications  New Prescriptions   No medications on file  Previous Medications   AMOXICILLIN (AMOXIL) 500 MG CAPSULE    Prior to dental appointments   BIKTARVY 50-200-25 MG TABS TABLET    TAKE 1 TABLET BY MOUTH EVERY DAY.   FLUOXETINE (PROZAC) 10 MG CAPSULE    Take 1 capsule (10 mg total) by mouth daily.   OLANZAPINE (ZYPREXA) 10 MG TABLET    Take 1 tablet (10 mg total) by mouth at bedtime.   OMEGA-3 ACID ETHYL ESTERS (LOVAZA) 1 G CAPSULE    Take 1 capsule (1 g total) by mouth 2 (two) times daily.   ONDANSETRON (ZOFRAN ODT) 4 MG DISINTEGRATING TABLET    Take 1 tablet (4 mg total) by mouth every 8 (eight) hours as needed for nausea or vomiting.   VALACYCLOVIR (VALTREX) 500 MG TABLET    Take 1 tablet by mouth 2 times daily  Modified Medications   No medications on file  Discontinued Medications   No medications on file    Allergies: Allergies  Allergen Reactions  . Acetaminophen Diarrhea and Nausea And Vomiting    Flares up crohns disease Flares up crohns disease Flares up crohns disease  . Other Other (See  Comments)    IV CONTRAST IV CONTRAST  . Vicodin [Hydrocodone-Acetaminophen] Nausea And Vomiting  . Dilaudid [Hydromorphone Hcl] Hives and Rash    Past Medical History: Past Medical History:  Diagnosis Date  . ADHD (attention deficit hyperactivity disorder)   . Anal warts   . Anxiety   . Asthma   . Bipolar disorder (HCC)   . Chronic bronchitis (HCC)   . Chronic pain syndrome   . Convulsions (HCC) 08/14/2015  . Depression   . Gonorrhea 09/08/11  . Headache    "weekly" (11/06/2016)  . Hemorrhoids, internal, with bleeding 08/25/2011  . Hepatitis C   . Herpes   . HIV infection (HCC)   . Hypertension   . ILEITIS 09/24/2010   Qualifier: Diagnosis of  By: Nelson-Smith CMA (AAMA), Dottie    . Lymphoid hyperplasia, reactive   . Marijuana abuse   . Migraine    "monthly" (11/06/2016)  . Pseudoseizures (HCC)   . Seizures (HCC)    "don't know why I have them" (11/06/2016)  . Syphilis     Social History: Social History   Socioeconomic History  . Marital status: Single    Spouse name: Not on file  . Number of children: 0  . Years of education: Not on file  . Highest education level: Not on file  Occupational History  . Occupation: Production designer, theatre/television/film  Tobacco Use  . Smoking status: Current Every  Day Smoker    Packs/day: 1.50    Years: 11.00    Pack years: 16.50    Types: Cigarettes  . Smokeless tobacco: Never Used  Vaping Use  . Vaping Use: Never used  Substance and Sexual Activity  . Alcohol use: Yes    Comment: occ  . Drug use: Yes    Frequency: 2.0 times per week    Types: Cocaine, Benzodiazepines, Oxycodone, Marijuana, Heroin    Comment: "just marijuana"   . Sexual activity: Not Currently    Partners: Male    Birth control/protection: Condom    Comment: declined condoms  Other Topics Concern  . Not on file  Social History Narrative   ** Merged History Encounter **       Social Determinants of Health   Financial Resource Strain: Not on file  Food Insecurity: Not on  file  Transportation Needs: Not on file  Physical Activity: Not on file  Stress: Not on file  Social Connections: Not on file    Labs: Lab Results  Component Value Date   HIV1RNAQUANT 2,750 (H) 10/17/2020   HIV1RNAQUANT 253 (H) 12/14/2019   HIV1RNAQUANT 52,400 (H) 11/09/2019   CD4TABS 578 03/12/2021   CD4TABS 262 (L) 10/17/2020   CD4TABS 516 12/14/2019    RPR and STI Lab Results  Component Value Date   LABRPR REACTIVE (A) 03/12/2021   LABRPR REACTIVE (A) 10/17/2020   LABRPR REACTIVE (A) 12/14/2019   LABRPR NON-REACTIVE 07/19/2019   LABRPR NON-REACTIVE 05/30/2018   RPRTITER 1:16 (H) 03/12/2021   RPRTITER 1:2,048 (H) 10/17/2020   RPRTITER 1:1,024 (H) 12/14/2019    STI Results GC CT  03/12/2021 Negative Negative  10/17/2020 Negative Negative  12/14/2019 Negative Negative  12/14/2019 Negative Negative  12/14/2019 Negative Negative  07/19/2019 Negative Negative  05/30/2018 Negative Negative  03/26/2016 Negative Negative  09/08/2011 - NEGATIVE    Hepatitis B Lab Results  Component Value Date   HEPBSAB NEG 03/26/2016   HEPBSAG Negative 11/16/2017   HEPBCAB Negative 11/16/2017   Hepatitis C Lab Results  Component Value Date   HCVRNAPCRQN 5,450,000 (H) 09/25/2019   Hepatitis A Lab Results  Component Value Date   HAV REACTIVE (A) 03/26/2016   Lipids: Lab Results  Component Value Date   CHOL 141 03/12/2021   TRIG 189 (H) 03/12/2021   HDL 47 03/12/2021   CHOLHDL 3.0 03/12/2021   VLDL 23 01/06/2017   LDLCALC 68 03/12/2021    Current HIV Regimen: Biktarvy  Assessment: Pedro Owens is seen on a work in basis. He was originally scheduled with Dr. Orvan Falconer but arrived too late to be seen. He is out of Biktarvy and needing refills.   I gave him 2 sample bottles of Biktarvy which should last him until his Harlen Danford is approved again. He is concerned about his Hepatitis C and I explained that we will not attempt to treat that again until he can prove that he is adherent  to his HIV medications. Had a long talk with him regarding staying in care and taking his medications. He will see Dr. Orvan Falconer in a few weeks.  Plan: - Biktarvy samples given (14 days) - HIV labs today - F/u with Dr. Orvan Falconer    Medication Samples have been provided to the patient.  Drug name: Biktarvy        Strength: 50/200/25 mg       Qty: 2 bottles (14 days)  LOT: CHSYSA  Exp.Date: 6/24  Dosing instructions: Take one tablet by mouth once daily  The patient has been instructed regarding the correct time, dose, and frequency of taking this medication, including desired effects and most common side effects.   Kazimir Hartnett L. Jannette Fogo, PharmD, BCIDP, AAHIVP, CPP Clinical Pharmacist Practitioner Infectious Diseases Clinical Pharmacist [facility] 09/30/2020, 10:07 AM

## 2021-03-13 ENCOUNTER — Ambulatory Visit: Payer: Medicaid Other

## 2021-03-13 LAB — URINE CYTOLOGY ANCILLARY ONLY
Chlamydia: NEGATIVE
Comment: NEGATIVE
Comment: NORMAL
Neisseria Gonorrhea: NEGATIVE

## 2021-03-13 LAB — T-HELPER CELL (CD4) - (RCID CLINIC ONLY)
CD4 % Helper T Cell: 34 % (ref 33–65)
CD4 T Cell Abs: 578 /uL (ref 400–1790)

## 2021-03-15 LAB — CBC WITH DIFFERENTIAL/PLATELET
Absolute Monocytes: 797 cells/uL (ref 200–950)
Basophils Absolute: 40 cells/uL (ref 0–200)
Basophils Relative: 0.6 %
Eosinophils Absolute: 288 cells/uL (ref 15–500)
Eosinophils Relative: 4.3 %
HCT: 46.1 % (ref 38.5–50.0)
Hemoglobin: 15.5 g/dL (ref 13.2–17.1)
Lymphs Abs: 1836 cells/uL (ref 850–3900)
MCH: 28.6 pg (ref 27.0–33.0)
MCHC: 33.6 g/dL (ref 32.0–36.0)
MCV: 85.1 fL (ref 80.0–100.0)
MPV: 10.1 fL (ref 7.5–12.5)
Monocytes Relative: 11.9 %
Neutro Abs: 3739 cells/uL (ref 1500–7800)
Neutrophils Relative %: 55.8 %
Platelets: 339 10*3/uL (ref 140–400)
RBC: 5.42 10*6/uL (ref 4.20–5.80)
RDW: 13.1 % (ref 11.0–15.0)
Total Lymphocyte: 27.4 %
WBC: 6.7 10*3/uL (ref 3.8–10.8)

## 2021-03-15 LAB — HIV-1 RNA QUANT-NO REFLEX-BLD
HIV 1 RNA Quant: 2420 Copies/mL — ABNORMAL HIGH
HIV-1 RNA Quant, Log: 3.38 Log cps/mL — ABNORMAL HIGH

## 2021-03-15 LAB — RPR: RPR Ser Ql: REACTIVE — AB

## 2021-03-15 LAB — COMPLETE METABOLIC PANEL WITH GFR
AG Ratio: 1.1 (calc) (ref 1.0–2.5)
ALT: 47 U/L — ABNORMAL HIGH (ref 9–46)
AST: 46 U/L — ABNORMAL HIGH (ref 10–40)
Albumin: 3.8 g/dL (ref 3.6–5.1)
Alkaline phosphatase (APISO): 85 U/L (ref 36–130)
BUN: 23 mg/dL (ref 7–25)
CO2: 29 mmol/L (ref 20–32)
Calcium: 9 mg/dL (ref 8.6–10.3)
Chloride: 106 mmol/L (ref 98–110)
Creat: 1.02 mg/dL (ref 0.60–1.35)
GFR, Est African American: 115 mL/min/{1.73_m2} (ref >=60)
GFR, Est Non African American: 100 mL/min/{1.73_m2} (ref >=60)
Globulin: 3.5 g/dL (calc) (ref 1.9–3.7)
Glucose, Bld: 104 mg/dL — ABNORMAL HIGH (ref 65–99)
Potassium: 3.9 mmol/L (ref 3.5–5.3)
Sodium: 139 mmol/L (ref 135–146)
Total Bilirubin: 0.3 mg/dL (ref 0.2–1.2)
Total Protein: 7.3 g/dL (ref 6.1–8.1)

## 2021-03-15 LAB — LIPID PANEL
Cholesterol: 141 mg/dL (ref <200)
HDL: 47 mg/dL (ref >=40)
LDL Cholesterol (Calc): 68 mg/dL (calc)
Non-HDL Cholesterol (Calc): 94 mg/dL (calc) (ref <130)
Total CHOL/HDL Ratio: 3 (calc) (ref <5.0)
Triglycerides: 189 mg/dL — ABNORMAL HIGH (ref <150)

## 2021-03-15 LAB — RPR TITER: RPR Titer: 1:16 {titer} — ABNORMAL HIGH

## 2021-03-15 LAB — FLUORESCENT TREPONEMAL AB(FTA)-IGG-BLD: Fluorescent Treponemal ABS: REACTIVE — AB

## 2021-03-31 ENCOUNTER — Other Ambulatory Visit: Payer: Self-pay | Admitting: Family

## 2021-03-31 ENCOUNTER — Other Ambulatory Visit: Payer: Self-pay | Admitting: Infectious Diseases

## 2021-03-31 DIAGNOSIS — B20 Human immunodeficiency virus [HIV] disease: Secondary | ICD-10-CM

## 2021-04-02 ENCOUNTER — Ambulatory Visit: Payer: Medicaid Other | Admitting: Internal Medicine

## 2021-04-08 ENCOUNTER — Ambulatory Visit: Payer: Medicaid Other | Admitting: Internal Medicine

## 2021-04-08 ENCOUNTER — Ambulatory Visit: Payer: Medicaid Other

## 2021-04-17 ENCOUNTER — Ambulatory Visit: Payer: Medicaid Other | Admitting: Internal Medicine

## 2021-07-11 ENCOUNTER — Telehealth: Payer: Self-pay

## 2021-07-11 NOTE — Telephone Encounter (Signed)
Called patient to schedule overdue appointment, no answer. Left HIPAA compliant voicemail requesting callback.   Neftaly Inzunza D Erven Ramson, RN  

## 2021-11-12 ENCOUNTER — Telehealth: Payer: Self-pay

## 2021-11-12 NOTE — Telephone Encounter (Signed)
Called patient to offer appointment, no answer. Left HIPAA compliant voicemail requesting callback.   Lindbergh Winkles D Christifer Chapdelaine, RN  

## 2021-11-24 ENCOUNTER — Other Ambulatory Visit: Payer: Self-pay | Admitting: Internal Medicine

## 2021-11-24 DIAGNOSIS — B20 Human immunodeficiency virus [HIV] disease: Secondary | ICD-10-CM

## 2021-11-24 NOTE — Telephone Encounter (Signed)
No vm box set up to leave a message. Patient overdue for office follow up.

## 2021-12-01 ENCOUNTER — Telehealth: Payer: Self-pay

## 2021-12-01 NOTE — Telephone Encounter (Signed)
Received refill request for Biktarvy. Per Epic, Susanne Borders has not been filled since 12/2020. Multiple attempts made to reach patient without success. ADAP needs to be renewed. Refill request refused as patient needs office visit and ADAP renewal.   Sandie Ano, RN

## 2022-01-01 ENCOUNTER — Ambulatory Visit: Payer: Self-pay | Admitting: Internal Medicine

## 2022-01-01 ENCOUNTER — Ambulatory Visit: Payer: Self-pay

## 2022-02-09 ENCOUNTER — Encounter: Payer: Self-pay | Admitting: Internal Medicine

## 2022-02-09 ENCOUNTER — Ambulatory Visit: Payer: Self-pay

## 2022-02-09 ENCOUNTER — Other Ambulatory Visit: Payer: Self-pay

## 2022-02-11 ENCOUNTER — Ambulatory Visit: Payer: Self-pay | Admitting: Internal Medicine

## 2022-02-12 ENCOUNTER — Ambulatory Visit (INDEPENDENT_AMBULATORY_CARE_PROVIDER_SITE_OTHER): Payer: Self-pay | Admitting: Internal Medicine

## 2022-02-12 ENCOUNTER — Other Ambulatory Visit: Payer: Self-pay

## 2022-02-12 ENCOUNTER — Encounter: Payer: Self-pay | Admitting: Internal Medicine

## 2022-02-12 DIAGNOSIS — B20 Human immunodeficiency virus [HIV] disease: Secondary | ICD-10-CM

## 2022-02-12 DIAGNOSIS — F331 Major depressive disorder, recurrent, moderate: Secondary | ICD-10-CM

## 2022-02-12 DIAGNOSIS — B182 Chronic viral hepatitis C: Secondary | ICD-10-CM

## 2022-02-12 MED ORDER — BIKTARVY 50-200-25 MG PO TABS: 1.0000 | ORAL_TABLET | Freq: Every day | ORAL | 11 refills | Status: DC

## 2022-02-12 NOTE — Assessment & Plan Note (Signed)
I will recheck his hepatitis C viral load today.  I would not consider treating until he shows that he is able to be adherent with regular visits and medication. ?

## 2022-02-12 NOTE — Assessment & Plan Note (Signed)
I strongly suspect that his depression is still active. ?

## 2022-02-12 NOTE — Assessment & Plan Note (Signed)
His RPR was coming down last year after retreatment for syphilis in January 2022.  I will repeat an RPR today. ?

## 2022-02-12 NOTE — Progress Notes (Signed)
? ?   ? ? ? ? ?Patient Active Problem List  ? Diagnosis Date Noted  ? 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  Priority: High  ? HIV disease ([place]) 04/07/2016  ?  Priority: High  ? Chronic hepatitis C without hepatic coma ([place]) 11/19/2017  ?  Priority: Medium   ? IVDU (intravenous drug user)   ?  Priority: Medium   ? Polysubstance abuse ([place]) 04/07/2016  ?  Priority: Medium   ? Depression 08/14/2015  ?  Priority: Medium   ? ANXIETY 09/24/2010  ?  Priority: Medium   ? Genital herpes 12/14/2019  ? Severe recurrent major depression w/psychotic features, mood-congruent ([place]) 10/04/2018  ? Transgender   ? Pleural effusion, bacterial 11/23/2017  ? Loculated pleural effusion 11/23/2017  ? Septic pulmonary embolism ([place]) 11/19/2017  ? Endocarditis, suspected 11/18/2017  ? Cigarette smoker 04/07/2016  ? Conversion disorder 08/14/2015  ? Hemorrhoids, internal, with bleeding 08/25/2011  ? Anal warts 08/25/2011  ? ADHD 09/24/2010  ? ? ?Patient's Medications  ?New Prescriptions  ? No medications on file  ?Previous Medications  ? AMOXICILLIN (AMOXIL) 500 MG CAPSULE    Prior to dental appointments  ? FLUOXETINE (PROZAC) 10 MG CAPSULE    Take 1 capsule (10 mg total) by mouth daily.  ? OLANZAPINE (ZYPREXA) 10 MG TABLET    Take 1 tablet (10 mg total) by mouth at bedtime.  ? OMEGA-3 ACID ETHYL ESTERS (LOVAZA) 1 G CAPSULE    Take 1 capsule (1 g total) by mouth 2 (two) times daily.  ? ONDANSETRON (ZOFRAN ODT) 4 MG DISINTEGRATING TABLET    Take 1 tablet (4 mg total) by mouth every 8 (eight) hours as needed for nausea or vomiting.  ? VALACYCLOVIR (VALTREX) 500 MG TABLET    Take 1 tablet by mouth 2 times daily  ?Modified Medications  ? Modified Medication Previous Medication  ? BICTEGRAVIR-EMTRICITABINE-TENOFOVIR AF (BIKTARVY) 50-200-25 MG TABS TABLET BIKTARVY 50-200-25 MG TABS tablet  ?    Take 1 tablet by mouth daily.    TAKE 1 TABLET BY MOUTH EVERY DAY.  ?Discontinued Medications  ? No medications on file  ? ? ?Subjective: ?Pedro Owens is in for her  first appointment since January of last year.  He has been out of [place] for the past 3 months.  He did not recertify his ADAP until 3 days ago.  He says he was not missing doses before he ran out.  He is currently living in an apartment with a roommate.  He does not have a job.  He says he has been smoking marijuana but denies using cocaine or other drugs.  He denies feeling anxious or depressed.  He tells me that he never started Harvoni when I attempted to treat his hepatitis C several years ago. ? ?Review of Systems: ?Review of Systems  ?Constitutional:  Negative for fever and malaise/fatigue.  ?Psychiatric/Behavioral:  Negative for depression. The patient is not nervous/anxious.   ? ?Past Medical History:  ?Diagnosis Date  ? ADHD (attention deficit hyperactivity disorder)   ? Anal warts   ? Anxiety   ? Asthma   ? Bipolar disorder ([place])   ? Chronic bronchitis ([place])   ? Chronic pain syndrome   ? Convulsions ([place]) 08/14/2015  ? Depression   ? Gonorrhea 09/08/11  ? Headache   ? "weekly" (11/06/2016)  ? Hemorrhoids, internal, with bleeding 08/25/2011  ? Hepatitis C   ? Herpes   ? HIV infection ([place])   ? Hypertension   ?  ILEITIS 09/24/2010  ? Qualifier: Diagnosis of  By: Nelson-Smith CMA (AAMA), Dottie    ? Lymphoid hyperplasia, reactive   ? Marijuana abuse   ? Migraine   ? "monthly" (11/06/2016)  ? Pseudoseizures   ? Seizures ([place])   ? "don't know why I have them" (11/06/2016)  ? Syphilis   ? ? ?Social History  ? ?Tobacco Use  ? Smoking status: Every Day  ?  Packs/day: 1.50  ?  Years: 11.00  ?  Pack years: 16.50  ?  Types: Cigarettes  ? Smokeless tobacco: Never  ?Vaping Use  ? Vaping Use: Never used  ?Substance Use Topics  ? Alcohol use: Yes  ?  Comment: occ  ? Drug use: Yes  ?  Frequency: 2.0 times per week  ?  Types: Cocaine, Benzodiazepines, Oxycodone, Marijuana, Heroin  ?  Comment: "just marijuana"   ? ? ?Family History  ?Problem Relation Age of Onset  ? Diabetes Mother   ? Irritable bowel syndrome Mother   ?  Hypertension Mother   ? Hyperlipidemia Mother   ? Hypothyroidism Mother   ? Diabetes Maternal Uncle   ? Heart disease Maternal Grandmother   ? Leukemia Other   ?     maternal greatgrandmother  ? Colon cancer Neg Hx   ? ? ?Allergies  ?Allergen Reactions  ? Acetaminophen Diarrhea and Nausea And Vomiting  ?  Flares up crohns disease ?Flares up crohns disease ?Flares up crohns disease  ? Other Other (See Comments)  ?  IV CONTRAST ?IV CONTRAST  ? Vicodin [Hydrocodone-Acetaminophen] Nausea And Vomiting  ? Dilaudid [Hydromorphone Hcl] Hives and Rash  ? ? ?Health Maintenance  ?Topic Date Due  ? COVID-19 Vaccine (1) Never done  ? PAP-Cervical Cytology Screening  Never done  ? PAP SMEAR-Modifier  Never done  ? TETANUS/TDAP  05/11/2018  ? INFLUENZA VACCINE  05/19/2022  ? Hepatitis C Screening  Completed  ? HIV Screening  Completed  ? HPV VACCINES  Aged Out  ? ? ?Objective: ? ?Vitals:  ? 02/12/22 0842  ?BP: 134/89  ?Pulse: 73  ?Temp: 98 ?F (36.7 ?C)  ?TempSrc: Oral  ?Weight: 164 lb (74.4 kg)  ? ?Body mass index is 22.87 kg/m?. ? ?Physical Exam ?Constitutional:   ?   Comments: He is very quiet and withdrawn.  ?Cardiovascular:  ?   Rate and Rhythm: Normal rate and regular rhythm.  ?   Heart sounds: No murmur heard. ?Pulmonary:  ?   Effort: Pulmonary effort is normal.  ?   Breath sounds: Normal breath sounds.  ?Psychiatric:  ?   Comments: He is very calm and appropriate but with a very flat affect.  ? ? ?Lab Results ?Lab Results  ?Component Value Date  ? WBC 6.7 03/12/2021  ? HGB 15.5 03/12/2021  ? HCT 46.1 03/12/2021  ? MCV 85.1 03/12/2021  ? PLT 339 03/12/2021  ?  ?Lab Results  ?Component Value Date  ? CREATININE 1.02 03/12/2021  ? BUN 23 03/12/2021  ? NA 139 03/12/2021  ? K 3.9 03/12/2021  ? CL 106 03/12/2021  ? CO2 29 03/12/2021  ?  ?Lab Results  ?Component Value Date  ? ALT 47 (H) 03/12/2021  ? AST 46 (H) 03/12/2021  ? GGT 102 (H) 09/25/2019  ? ALKPHOS 97 10/31/2018  ? BILITOT 0.3 03/12/2021  ?  ?Lab Results  ?Component Value  Date  ? CHOL 141 03/12/2021  ? HDL 47 03/12/2021  ? [place] 68 03/12/2021  ? TRIG 189 (H) 03/12/2021  ?  CHOLHDL 3.0 03/12/2021  ? ?Lab Results  ?Component Value Date  ? LABRPR REACTIVE (A) 03/12/2021  ? RPRTITER 1:16 (H) 03/12/2021  ? ?HIV 1 RNA Quant  ?Date Value  ?03/12/2021 2,420 Copies/mL (H)  ?10/17/2020 2,750 Copies/mL (H)  ?12/14/2019 253 copies/mL (H)  ? ?CD4 T Cell Abs (/uL)  ?Date Value  ?03/12/2021 578  ?10/17/2020 262 (L)  ?12/14/2019 516  ? ?  ?Problem List Items Addressed This Visit   ? ?  ? High  ? HIV disease ([place])  ?  He continues to struggle with adherence.  He will get repeat lab work today and restart [employer].  He will follow-up here in 2 months, hopefully. ? ?  ?  ? Relevant Medications  ? bictegravir-emtricitabine-tenofovir AF (BIKTARVY) 50-200-25 MG TABS tablet  ? Other Relevant Orders  ? HIV-1 RNA quant-no reflex-bld  ? CBC  ? Comprehensive metabolic panel  ? RPR  ? Lipid panel  ? T-helper cells (CD4) count (not at [facility])  ?  ? Medium   ? Depression  ?  I strongly suspect that his depression is still active. ? ?  ?  ? Chronic hepatitis C without hepatic coma (HCC)  ?  I will recheck his hepatitis C viral load today.  I would not consider treating until he shows that he is able to be adherent with regular visits and medication. ? ?  ?  ? Relevant Medications  ? bictegravir-emtricitabine-tenofovir AF (BIKTARVY) 50-200-25 MG TABS tablet  ? Other Relevant Orders  ? Hepatitis C RNA quantitative  ? ? ? ? ?Michel Bickers, MD ?[facility] ?[facility] ?[phone] pager   [phone] cell ?02/12/2022, 8:57 AM ? ?

## 2022-02-12 NOTE — Assessment & Plan Note (Signed)
He continues to struggle with adherence.  He will get repeat lab work today and restart [employer].  He will follow-up here in 2 months, hopefully. ?

## 2022-02-13 ENCOUNTER — Telehealth: Payer: Self-pay

## 2022-02-13 NOTE — Telephone Encounter (Signed)
Mother called and left vm asking that we refill Valtrex  ?Pharmacy: [facility] on [address] ?Call back number: [phone] ?Pls advise ?

## 2022-02-14 LAB — HELPER T-LYMPH-CD4 (ARMC ONLY)
% CD 4 Pos. Lymph.: 31.8 % (ref 30.8–58.5)
Absolute CD 4 Helper: 509 /uL (ref 359–1519)
Basophils Absolute: 0 10*3/uL (ref 0.0–0.2)
Basos: 0 %
EOS (ABSOLUTE): 0.2 10*3/uL (ref 0.0–0.4)
Eos: 5 %
Hematocrit: 46 % (ref 37.5–51.0)
Hemoglobin: 15.3 g/dL (ref 13.0–17.7)
Immature Grans (Abs): 0 10*3/uL (ref 0.0–0.1)
Immature Granulocytes: 0 %
Lymphocytes Absolute: 1.6 10*3/uL (ref 0.7–3.1)
Lymphs: 36 %
MCH: 29.8 pg (ref 26.6–33.0)
MCHC: 33.3 g/dL (ref 31.5–35.7)
MCV: 90 fL (ref 79–97)
Monocytes Absolute: 0.2 10*3/uL (ref 0.1–0.9)
Monocytes: 5 %
Neutrophils Absolute: 2.5 10*3/uL (ref 1.4–7.0)
Neutrophils: 54 %
Platelets: 298 10*3/uL (ref 150–450)
RBC: 5.13 x10E6/uL (ref 4.14–5.80)
RDW: 12.4 % (ref 11.6–15.4)
WBC: 4.6 10*3/uL (ref 3.4–10.8)

## 2022-02-16 LAB — T-HELPER CELLS (CD4) COUNT (NOT AT ARMC)

## 2022-02-17 NOTE — Telephone Encounter (Signed)
Attempted to follow up with patient for additional information. Patient's voicemail has currently not been set up. Sending patient a Mychart message to follow up. ?Pedro Owens ? ?

## 2022-02-23 LAB — CBC
HCT: 44.1 % (ref 38.5–50.0)
Hemoglobin: 15 g/dL (ref 13.2–17.1)
MCH: 29 pg (ref 27.0–33.0)
MCHC: 34 g/dL (ref 32.0–36.0)
MCV: 85.1 fL (ref 80.0–100.0)
MPV: 10.2 fL (ref 7.5–12.5)
Platelets: 300 10*3/uL (ref 140–400)
RBC: 5.18 10*6/uL (ref 4.20–5.80)
RDW: 13 % (ref 11.0–15.0)
WBC: 4.7 10*3/uL (ref 3.8–10.8)

## 2022-02-23 LAB — RPR: RPR Ser Ql: REACTIVE — AB

## 2022-02-23 LAB — LIPID PANEL
Cholesterol: 132 mg/dL (ref <200)
HDL: 42 mg/dL (ref >=40)
LDL Cholesterol (Calc): 59 mg/dL (calc)
Non-HDL Cholesterol (Calc): 90 mg/dL (calc) (ref <130)
Total CHOL/HDL Ratio: 3.1 (calc) (ref <5.0)
Triglycerides: 266 mg/dL — ABNORMAL HIGH (ref <150)

## 2022-02-23 LAB — COMPREHENSIVE METABOLIC PANEL
AG Ratio: 1.1 (calc) (ref 1.0–2.5)
ALT: 56 U/L — ABNORMAL HIGH (ref 9–46)
AST: 58 U/L — ABNORMAL HIGH (ref 10–40)
Albumin: 3.8 g/dL (ref 3.6–5.1)
Alkaline phosphatase (APISO): 87 U/L (ref 36–130)
BUN: 12 mg/dL (ref 7–25)
CO2: 27 mmol/L (ref 20–32)
Calcium: 9 mg/dL (ref 8.6–10.3)
Chloride: 107 mmol/L (ref 98–110)
Creat: 0.89 mg/dL (ref 0.60–1.24)
Globulin: 3.5 g/dL (calc) (ref 1.9–3.7)
Glucose, Bld: 96 mg/dL (ref 65–99)
Potassium: 3.8 mmol/L (ref 3.5–5.3)
Sodium: 141 mmol/L (ref 135–146)
Total Bilirubin: 0.3 mg/dL (ref 0.2–1.2)
Total Protein: 7.3 g/dL (ref 6.1–8.1)

## 2022-02-23 LAB — FLUORESCENT TREPONEMAL AB(FTA)-IGG-BLD: Fluorescent Treponemal ABS: REACTIVE — AB

## 2022-02-23 LAB — HIV-1 RNA QUANT-NO REFLEX-BLD

## 2022-02-23 LAB — HEPATITIS C RNA QUANTITATIVE
HCV Quantitative Log: 7.06 log IU/mL — ABNORMAL HIGH
HCV RNA, PCR, QN: 11600000 IU/mL — ABNORMAL HIGH

## 2022-02-23 LAB — RPR TITER: RPR Titer: 1:4 {titer} — ABNORMAL HIGH

## 2022-04-09 ENCOUNTER — Other Ambulatory Visit: Payer: Self-pay | Admitting: Internal Medicine

## 2022-04-09 DIAGNOSIS — A6000 Herpesviral infection of urogenital system, unspecified: Secondary | ICD-10-CM

## 2022-04-09 NOTE — Telephone Encounter (Signed)
Please advise if okay to refill. 

## 2022-04-16 ENCOUNTER — Ambulatory Visit: Payer: Medicaid Other | Admitting: Internal Medicine

## 2022-04-17 ENCOUNTER — Telehealth: Payer: Self-pay

## 2022-04-17 NOTE — Telephone Encounter (Signed)
Attempted to reach patient to schedule appt after rcvd electronic refill request for Valtrex. Last filled 2021. Will need to see patient who missed 04/16/22 appt.

## 2022-05-26 ENCOUNTER — Other Ambulatory Visit: Payer: Self-pay | Admitting: Internal Medicine

## 2022-05-26 ENCOUNTER — Telehealth: Payer: Self-pay

## 2022-05-26 DIAGNOSIS — A6000 Herpesviral infection of urogenital system, unspecified: Secondary | ICD-10-CM

## 2022-05-26 MED ORDER — VALACYCLOVIR HCL 500 MG PO TABS: ORAL_TABLET | ORAL | 11 refills | Status: DC

## 2022-05-26 NOTE — Telephone Encounter (Signed)
Patient's mother called requesting that refill of Valtrex be sent to [facility] [place]. Will route to provider.   Sandie Ano, RN

## 2022-06-03 ENCOUNTER — Ambulatory Visit: Payer: Self-pay | Admitting: Internal Medicine

## 2022-06-25 ENCOUNTER — Ambulatory Visit: Payer: Self-pay | Admitting: Internal Medicine

## 2022-08-18 ENCOUNTER — Emergency Department (HOSPITAL_BASED_OUTPATIENT_CLINIC_OR_DEPARTMENT_OTHER): Payer: Medicaid Other

## 2022-08-18 ENCOUNTER — Encounter (HOSPITAL_BASED_OUTPATIENT_CLINIC_OR_DEPARTMENT_OTHER): Payer: Self-pay

## 2022-08-18 ENCOUNTER — Other Ambulatory Visit: Payer: Self-pay

## 2022-08-18 ENCOUNTER — Emergency Department (HOSPITAL_BASED_OUTPATIENT_CLINIC_OR_DEPARTMENT_OTHER): Payer: Medicaid Other | Admitting: Radiology

## 2022-08-18 ENCOUNTER — Inpatient Hospital Stay (HOSPITAL_BASED_OUTPATIENT_CLINIC_OR_DEPARTMENT_OTHER)
Admission: EM | Admit: 2022-08-18 | Discharge: 2022-08-21 | DRG: 975 | Disposition: A | Discharge status: Home / Self Care | Payer: Medicaid Other | Attending: Internal Medicine | Admitting: Internal Medicine

## 2022-08-18 DIAGNOSIS — Z83438 Family history of other disorder of lipoprotein metabolism and other lipidemia: Secondary | ICD-10-CM

## 2022-08-18 DIAGNOSIS — Z79899 Other long term (current) drug therapy: Secondary | ICD-10-CM

## 2022-08-18 DIAGNOSIS — I10 Essential (primary) hypertension: Secondary | ICD-10-CM | POA: Diagnosis present

## 2022-08-18 DIAGNOSIS — Z21 Asymptomatic human immunodeficiency virus [HIV] infection status: Secondary | ICD-10-CM | POA: Diagnosis present

## 2022-08-18 DIAGNOSIS — F17210 Nicotine dependence, cigarettes, uncomplicated: Secondary | ICD-10-CM | POA: Diagnosis present

## 2022-08-18 DIAGNOSIS — G894 Chronic pain syndrome: Secondary | ICD-10-CM | POA: Diagnosis present

## 2022-08-18 DIAGNOSIS — Z1152 Encounter for screening for COVID-19: Secondary | ICD-10-CM

## 2022-08-18 DIAGNOSIS — J440 Chronic obstructive pulmonary disease with acute lower respiratory infection: Secondary | ICD-10-CM | POA: Diagnosis present

## 2022-08-18 DIAGNOSIS — F640 Transsexualism: Secondary | ICD-10-CM | POA: Diagnosis present

## 2022-08-18 DIAGNOSIS — J189 Pneumonia, unspecified organism: Principal | ICD-10-CM | POA: Diagnosis present

## 2022-08-18 DIAGNOSIS — F32A Depression, unspecified: Secondary | ICD-10-CM | POA: Diagnosis present

## 2022-08-18 DIAGNOSIS — Z91199 Patient's noncompliance with other medical treatment and regimen due to unspecified reason: Secondary | ICD-10-CM

## 2022-08-18 DIAGNOSIS — Z8249 Family history of ischemic heart disease and other diseases of the circulatory system: Secondary | ICD-10-CM

## 2022-08-18 DIAGNOSIS — B20 Human immunodeficiency virus [HIV] disease: Secondary | ICD-10-CM | POA: Diagnosis present

## 2022-08-18 DIAGNOSIS — Z833 Family history of diabetes mellitus: Secondary | ICD-10-CM

## 2022-08-18 DIAGNOSIS — F909 Attention-deficit hyperactivity disorder, unspecified type: Secondary | ICD-10-CM | POA: Diagnosis present

## 2022-08-18 DIAGNOSIS — Z885 Allergy status to narcotic agent status: Secondary | ICD-10-CM

## 2022-08-18 DIAGNOSIS — B1920 Unspecified viral hepatitis C without hepatic coma: Secondary | ICD-10-CM | POA: Diagnosis present

## 2022-08-18 DIAGNOSIS — Z886 Allergy status to analgesic agent status: Secondary | ICD-10-CM

## 2022-08-18 LAB — HEPATIC FUNCTION PANEL
ALT: 53 U/L — ABNORMAL HIGH (ref 0–44)
AST: 42 U/L — ABNORMAL HIGH (ref 15–41)
Albumin: 4.1 g/dL (ref 3.5–5.0)
Alkaline Phosphatase: 70 U/L (ref 38–126)
Bilirubin, Direct: 0.3 mg/dL — ABNORMAL HIGH (ref 0.0–0.2)
Indirect Bilirubin: 0.7 mg/dL (ref 0.3–0.9)
Total Bilirubin: 1 mg/dL (ref 0.3–1.2)
Total Protein: 8.5 g/dL — ABNORMAL HIGH (ref 6.5–8.1)

## 2022-08-18 LAB — CBC
HCT: 47.9 % (ref 39.0–52.0)
Hemoglobin: 16.4 g/dL (ref 13.0–17.0)
MCH: 29.1 pg (ref 26.0–34.0)
MCHC: 34.2 g/dL (ref 30.0–36.0)
MCV: 84.9 fL (ref 80.0–100.0)
Platelets: 304 10*3/uL (ref 150–400)
RBC: 5.64 MIL/uL (ref 4.22–5.81)
RDW: 13.2 % (ref 11.5–15.5)
WBC: 21.1 10*3/uL — ABNORMAL HIGH (ref 4.0–10.5)
nRBC: 0 % (ref 0.0–0.2)

## 2022-08-18 LAB — BASIC METABOLIC PANEL
Anion gap: 9 (ref 5–15)
BUN: 16 mg/dL (ref 6–20)
CO2: 24 mmol/L (ref 22–32)
Calcium: 9.2 mg/dL (ref 8.9–10.3)
Chloride: 102 mmol/L (ref 98–111)
Creatinine, Ser: 1.13 mg/dL (ref 0.61–1.24)
GFR, Estimated: >60 mL/min (ref >60)
Glucose, Bld: 124 mg/dL — ABNORMAL HIGH (ref 70–99)
Potassium: 3.7 mmol/L (ref 3.5–5.1)
Sodium: 135 mmol/L (ref 135–145)

## 2022-08-18 LAB — URINALYSIS, ROUTINE W REFLEX MICROSCOPIC
Bilirubin Urine: NEGATIVE
Glucose, UA: NEGATIVE mg/dL
Hgb urine dipstick: NEGATIVE
Ketones, ur: NEGATIVE mg/dL
Nitrite: NEGATIVE
Protein, ur: NEGATIVE mg/dL
Specific Gravity, Urine: 1.018 (ref 1.005–1.030)
pH: 7 (ref 5.0–8.0)

## 2022-08-18 LAB — RESP PANEL BY RT-PCR (FLU A&B, COVID) ARPGX2
Influenza A by PCR: NEGATIVE
Influenza B by PCR: NEGATIVE
SARS Coronavirus 2 by RT PCR: NEGATIVE

## 2022-08-18 LAB — TROPONIN I (HIGH SENSITIVITY)
Troponin I (High Sensitivity): 4 ng/L (ref <18)
Troponin I (High Sensitivity): 4 ng/L (ref <18)

## 2022-08-18 LAB — LACTIC ACID, PLASMA: Lactic Acid, Venous: 0.8 mmol/L (ref 0.5–1.9)

## 2022-08-18 MED ORDER — LACTATED RINGERS IV BOLUS (SEPSIS)
1000.0000 mL | Freq: Once | INTRAVENOUS | Status: AC
  Administered 2022-08-18: 1000 mL via INTRAVENOUS

## 2022-08-18 MED ORDER — BICTEGRAVIR-EMTRICITAB-TENOFOV 50-200-25 MG PO TABS
1.0000 | ORAL_TABLET | Freq: Every day | ORAL | Status: DC
  Administered 2022-08-18 – 2022-08-21 (×4): 1 via ORAL
  Filled 2022-08-18 (×4): qty 1

## 2022-08-18 MED ORDER — ACETAMINOPHEN 325 MG PO TABS
650.0000 mg | ORAL_TABLET | Freq: Four times a day (QID) | ORAL | Status: DC | PRN
  Administered 2022-08-18 – 2022-08-21 (×6): 650 mg via ORAL
  Filled 2022-08-18 (×6): qty 2

## 2022-08-18 MED ORDER — FLUOXETINE HCL 10 MG PO CAPS
10.0000 mg | ORAL_CAPSULE | Freq: Every day | ORAL | Status: DC
  Administered 2022-08-18 – 2022-08-21 (×4): 10 mg via ORAL
  Filled 2022-08-18 (×4): qty 1

## 2022-08-18 MED ORDER — GUAIFENESIN-CODEINE 100-10 MG/5ML PO SOLN
5.0000 mL | ORAL | Status: DC | PRN
  Administered 2022-08-19 – 2022-08-21 (×6): 5 mL via ORAL
  Filled 2022-08-18 (×7): qty 5

## 2022-08-18 MED ORDER — ACETAMINOPHEN 325 MG PO TABS: 650.0000 mg | ORAL_TABLET | Freq: Four times a day (QID) | ORAL | Status: DC | PRN

## 2022-08-18 MED ORDER — ONDANSETRON 4 MG PO TBDP: 4.0000 mg | ORAL_TABLET | Freq: Three times a day (TID) | ORAL | Status: DC | PRN

## 2022-08-18 MED ORDER — KETOROLAC TROMETHAMINE 30 MG/ML IJ SOLN
30.0000 mg | Freq: Four times a day (QID) | INTRAMUSCULAR | Status: DC | PRN
  Administered 2022-08-18 – 2022-08-20 (×3): 30 mg via INTRAVENOUS
  Filled 2022-08-18 (×3): qty 1

## 2022-08-18 MED ORDER — TRAZODONE HCL 50 MG PO TABS
50.0000 mg | ORAL_TABLET | Freq: Every evening | ORAL | Status: DC | PRN
  Administered 2022-08-19 – 2022-08-20 (×2): 50 mg via ORAL
  Filled 2022-08-18 (×2): qty 1

## 2022-08-18 MED ORDER — AZITHROMYCIN 500 MG PO TABS: 500.0000 mg | ORAL_TABLET | Freq: Every day | ORAL | Status: DC

## 2022-08-18 MED ORDER — SODIUM CHLORIDE 0.9 % IV SOLN
500.0000 mg | Freq: Once | INTRAVENOUS | Status: AC
  Administered 2022-08-18: 500 mg via INTRAVENOUS
  Filled 2022-08-18: qty 5

## 2022-08-18 MED ORDER — SODIUM CHLORIDE 0.9 % IV SOLN
1.0000 g | Freq: Once | INTRAVENOUS | Status: AC
  Administered 2022-08-18: 1 g via INTRAVENOUS
  Filled 2022-08-18: qty 10

## 2022-08-18 MED ORDER — ENOXAPARIN SODIUM 40 MG/0.4ML IJ SOSY
40.0000 mg | PREFILLED_SYRINGE | INTRAMUSCULAR | Status: DC
  Administered 2022-08-18 – 2022-08-20 (×3): 40 mg via SUBCUTANEOUS
  Filled 2022-08-18 (×3): qty 0.4

## 2022-08-18 MED ORDER — IBUPROFEN 400 MG PO TABS
400.0000 mg | ORAL_TABLET | Freq: Once | ORAL | Status: AC | PRN
  Administered 2022-08-18: 400 mg via ORAL
  Filled 2022-08-18: qty 1

## 2022-08-18 MED ORDER — IBUPROFEN 400 MG PO TABS
400.0000 mg | ORAL_TABLET | Freq: Once | ORAL | Status: AC
  Administered 2022-08-18: 400 mg via ORAL
  Filled 2022-08-18: qty 1

## 2022-08-18 MED ORDER — SODIUM CHLORIDE 0.9 % IV SOLN
250.0000 mL | INTRAVENOUS | Status: DC | PRN
  Administered 2022-08-20: 250 mL via INTRAVENOUS

## 2022-08-18 MED ORDER — SULFAMETHOXAZOLE-TRIMETHOPRIM 400-80 MG/5ML IV SOLN
20.0000 mg/kg/d | Freq: Three times a day (TID) | INTRAVENOUS | Status: DC
  Administered 2022-08-18 – 2022-08-19 (×3): 529.28 mg via INTRAVENOUS
  Filled 2022-08-18 (×5): qty 33.08

## 2022-08-18 MED ORDER — SODIUM CHLORIDE 0.9% FLUSH: 3.0000 mL | INTRAVENOUS | Status: DC | PRN

## 2022-08-18 MED ORDER — ACETAMINOPHEN 325 MG PO TABS
650.0000 mg | ORAL_TABLET | Freq: Once | ORAL | Status: AC
  Administered 2022-08-18: 650 mg via ORAL
  Filled 2022-08-18: qty 2

## 2022-08-18 MED ORDER — SODIUM CHLORIDE 0.9 % IV SOLN
2.0000 g | INTRAVENOUS | Status: DC
  Administered 2022-08-18 – 2022-08-20 (×3): 2 g via INTRAVENOUS
  Filled 2022-08-18 (×3): qty 20

## 2022-08-18 MED ORDER — AZITHROMYCIN 500 MG PO TABS
500.0000 mg | ORAL_TABLET | Freq: Every day | ORAL | Status: DC
  Administered 2022-08-18 – 2022-08-20 (×3): 500 mg via ORAL
  Filled 2022-08-18 (×3): qty 1

## 2022-08-18 MED ORDER — VALACYCLOVIR HCL 500 MG PO TABS
500.0000 mg | ORAL_TABLET | Freq: Two times a day (BID) | ORAL | Status: DC
  Administered 2022-08-18 – 2022-08-21 (×6): 500 mg via ORAL
  Filled 2022-08-18 (×7): qty 1

## 2022-08-18 MED ORDER — SODIUM CHLORIDE 0.9% FLUSH
3.0000 mL | Freq: Two times a day (BID) | INTRAVENOUS | Status: DC
  Administered 2022-08-18 – 2022-08-21 (×6): 3 mL via INTRAVENOUS

## 2022-08-18 NOTE — ED Provider Notes (Signed)
[facility] EMERGENCY DEPT  Provider Note  CSN: [id number] Arrival date & time: 08/18/22 0055  History Chief Complaint  Patient presents with   Fever   Cough   Chest Pain    Pedro Owens is a 31 y.o. adult with history of HIV (history of noncompliance but has been taking meds in recent weeks) and Hep C (never had treatment) reports several days of URI symptoms, worsened today with cough, chest soreness and sputum. He has had fever and sweats off and on. He is unsure last CD4 counts (from Epic CD4 in Apr was 509). Has had complicated pneumonia requiring chest tube in the past. Denies any IVDA, tick bites or rashes. No N/V/D or dysuria. No abdominal pain.    Home Medications Prior to Admission medications   Medication Sig Start Date End Date Taking? Authorizing Provider  amoxicillin (AMOXIL) 500 MG capsule Prior to dental appointments Patient not taking: Reported on 02/12/2022 10/09/20   [provider]  bictegravir-emtricitabine-tenofovir AF (BIKTARVY) 50-200-25 MG TABS tablet Take 1 tablet by mouth daily. 02/12/22   Michel Bickers, MD  FLUoxetine (PROZAC) 10 MG capsule Take 1 capsule (10 mg total) by mouth daily. Patient not taking: Reported on 10/11/2019 10/12/18   [name], MD  OLANZapine (ZYPREXA) 10 MG tablet Take 1 tablet (10 mg total) by mouth at bedtime. Patient not taking: Reported on 08/15/2019 10/11/18   [name], MD  omega-3 acid ethyl esters (LOVAZA) 1 g capsule Take 1 capsule (1 g total) by mouth 2 (two) times daily. Patient not taking: Reported on 08/15/2019 10/11/18   [name], MD  ondansetron (ZOFRAN ODT) 4 MG disintegrating tablet Take 1 tablet (4 mg total) by mouth every 8 (eight) hours as needed for nausea or vomiting. Patient not taking: Reported on 11/05/2020 02/29/20   Kuppelweiser, Cassie L, RPH-CPP  valACYclovir (VALTREX) 500 MG tablet Take 1 tablet by mouth 2 times daily 05/26/22   Michel Bickers, MD  divalproex (DEPAKOTE) 500  MG DR tablet Take 1 tablet (500 mg total) by mouth 2 (two) times daily. 02/28/16 02/28/16  Maryan Puls, MD     Allergies    Acetaminophen, Other, Vicodin [hydrocodone-acetaminophen], and Dilaudid [hydromorphone hcl]   Review of Systems   Review of Systems Please see HPI for pertinent positives and negatives  Physical Exam BP 116/72   Pulse 60   Temp 98.6 F (37 C)   Resp 19   Ht 5\' 11"  (1.803 m)   Wt 79.4 kg   SpO2 98%   BMI 24.41 kg/m   Physical Exam Vitals and nursing note reviewed.  Constitutional:      Appearance: Normal appearance. She is diaphoretic.  HENT:     Head: Normocephalic and atraumatic.     Nose: Nose normal.     Mouth/Throat:     Mouth: Mucous membranes are moist.  Eyes:     Extraocular Movements: Extraocular movements intact.     Conjunctiva/sclera: Conjunctivae normal.  Cardiovascular:     Rate and Rhythm: Normal rate.  Pulmonary:     Effort: Pulmonary effort is normal.     Breath sounds: Normal breath sounds.  Abdominal:     General: Abdomen is flat.     Palpations: Abdomen is soft.     Tenderness: There is no abdominal tenderness.  Musculoskeletal:        General: No swelling. Normal range of motion.     Cervical back: Neck supple.  Skin:    General: Skin is warm.  Neurological:     General: No focal deficit present.     Mental Status: She is alert.  Psychiatric:        Mood and Affect: Mood normal.     ED Results / Procedures / Treatments   EKG EKG Interpretation  Date/Time:  Tuesday August 18 2022 01:12:18 EDT Ventricular Rate:  93 PR Interval:  148 QRS Duration: 84 QT Interval:  356 QTC Calculation: 442 R Axis:   129 Text Interpretation: Normal sinus rhythm Right atrial enlargement Right axis deviation Pulmonary disease pattern Abnormal ECG When compared with ECG of 03-Oct-2018 17:10, T waves are less prominent Confirmed by Susy Frizzle [id number]) on 08/18/2022 2:08:29 AM  Procedures Procedures  Medications Ordered  in the ED Medications  ibuprofen (ADVIL) tablet 400 mg (400 mg Oral Given 08/18/22 0107)  lactated ringers bolus 1,000 mL (0 mLs Intravenous Stopped 08/18/22 0458)  cefTRIAXone (ROCEPHIN) 1 g in sodium chloride 0.9 % 100 mL IVPB (0 g Intravenous Stopped 08/18/22 0459)  azithromycin (ZITHROMAX) 500 mg in sodium chloride 0.9 % 250 mL IVPB (0 mg Intravenous Stopped 08/18/22 0604)    Initial Impression and Plan  Patient arrives to the ED via EMS with fever and respiratory symptoms. Initially febrile and borderline hypotensive in triage, but improved on arrival to the room. Labs done in triage show CBC with leukocytosis. Covid/Flu is neg. BMP unremarkable. I personally viewed the images from radiology studies and agree with radiologist interpretation: CXR is clear. Given history of hepC will add LFTs. Cultures and Lactic acid added as well as LR bolus. Will send for CT chest to eval occult pneumonia or other signs of opportunistic infection.  ED Course   Clinical Course as of 08/18/22 0633  Tue Aug 18, 2022  0412 Lactic acid is normal. LFTs not signifiacntly elevated. I personally viewed the images from radiology studies and agree with radiologist interpretation: CT shows multilobar PNA. Abx ordered for CAP. Given his fever, borderline BP and HIV with noncompliance, will plan admission for further management.  [CS]  0501 Spoke with Dr. Loney Loh, Hospitalist, who will accept for admission.  [CS]    Clinical Course User Index [CS] Pollyann Savoy, MD     MDM Rules/Calculators/A&P Medical Decision Making Problems Addressed: Community acquired pneumonia, unspecified laterality: acute illness or injury History of HIV infection [facility]): chronic illness or injury  Amount and/or Complexity of Data Reviewed Labs: ordered. Decision-making details documented in ED Course. Radiology: ordered and independent interpretation performed. Decision-making details documented in ED Course.  Risk Prescription  drug management. Decision regarding hospitalization.    Final Clinical Impression(s) / ED Diagnoses Final diagnoses:  Community acquired pneumonia, unspecified laterality  History of HIV infection (HCC)    Rx / DC Orders ED Discharge Orders     None        Pollyann Savoy, MD 08/18/22 [phone]

## 2022-08-18 NOTE — ED Notes (Signed)
Pt's mother called for update. Pt gave consent to give mother information.

## 2022-08-18 NOTE — Plan of Care (Signed)
[facility] will assume care on arrival to accepting facility. Until arrival, care as per EDP. However, [facility] available 24/7 for questions and assistance.  Nursing staff, please page [facility] Admits and Consults ([phone]) as soon as the patient arrives to the hospital.   

## 2022-08-18 NOTE — ED Triage Notes (Signed)
Pt reports flu like symptoms since this morning. Pt reports fever, chills, bodyaches, and productive cough. Did not take any meds at home for symptoms.

## 2022-08-18 NOTE — Subjective & Objective (Signed)
31 year old transgender male with history of HIV and medication noncompliance, hep C, bipolar disorder, depression presenting to ED with complaints of cough, fever, and chest soreness.  Symptoms present for about a week with progressive worsening. EMS activated and patient transported to [facility] for further evaluation.

## 2022-08-18 NOTE — Assessment & Plan Note (Signed)
Patient with a week long illness with fever and cough, headache, malaise. His symptoms became acutely worse on the day of admission. EMS activated and he was transported to [facility] where he was found to be febrile to 101.8, CT revealed multi-lobar PNA, and leukocytosis to 21.1. He has HIV followed in the ID clinic. He was out of medication due to loss of insurance but resumed HIV meds recently. In ED he received 1L IVF and was administered 2 g Rocephin and 500 mg azithromycin.  Plan Additional studies ordered: R ag for legionella and Strep pneuomo; smear for pneumocystis  Continue Rocephin 2 g IV q24 and Azithromycin 500 mg PO daily  Septra IV 20 mg/kg q 8  ID will see 08/19/22

## 2022-08-18 NOTE — Progress Notes (Addendum)
NEW ADMISSION NOTE New Admission Note:   Arrival Method:stretcher Mental Orientation: A&OX4 Telemetry:5M03 Assessment: Completed Skin:intact IV:LFA Pain: 7/10 Tubes: none Safety Measures: Safety Fall Prevention Plan has been given, discussed and signed Admission: Completed [facility] Orientation: Patient has been orientated to the room, unit and staff.  Family:none  Orders have been reviewed and implemented. Will continue to monitor the patient. Call light has been placed within reach and bed alarm has been activated.   Ebony Yorio S Bita Cartwright, RN

## 2022-08-18 NOTE — Assessment & Plan Note (Signed)
Patient followed in ID clinic  Plan Resume prior HIV meds.

## 2022-08-18 NOTE — H&P (Signed)
History and Physical    Pedro Owens RWE:[id number] DOB: [birth year] DOA: 08/18/2022  DOS: the patient was seen and examined on 08/18/2022  PCP: Diamantina Providence, FNP   Patient coming from: Home, transferred from [facility] ER  I have personally briefly reviewed patient's old medical records in [facility]  31 year old transgender male with history of HIV and medication noncompliance, hep C, bipolar disorder, depression presenting to ED with complaints of cough, fever, and chest soreness.  Symptoms present for about a week with progressive worsening. EMS activated and patient transported to [facility] for further evaluation. Patient with past h/o PNA with empyema requiring chest tube. He report pain in his chest is similar to previous PNA  ED Course: Febrile on presentation to 101.8, leukocytosis to 21.1. Patient was diaphoretic. CT revealed multilobar PNA. Patient given 1L IVF and was started on Rocephin 2 g and azithromycin 500 mg.   Review of Systems:  Review of Systems  Constitutional:  Positive for chills, fever and malaise/fatigue. Negative for weight loss.  HENT:  Positive for congestion and sore throat.   Eyes: Negative.   Respiratory:  Positive for cough, sputum production and shortness of breath.   Cardiovascular:  Positive for chest pain. Negative for claudication and leg swelling.  Gastrointestinal:  Positive for nausea. Negative for abdominal pain and heartburn.  Genitourinary: Negative.   Musculoskeletal:  Positive for myalgias. Negative for back pain and neck pain.  Skin: Negative.   Neurological:  Positive for weakness and headaches.  Endo/Heme/Allergies: Negative.   Psychiatric/Behavioral:  Negative for depression and substance abuse. The patient is nervous/anxious.        Denies current use of drugs - 4 years into recovery    Past Medical History:  Diagnosis Date   ADHD (attention deficit hyperactivity disorder)    Anal warts    Anxiety    Asthma    Bipolar  disorder (HCC)    Chronic bronchitis (HCC)    Chronic pain syndrome    Convulsions (HCC) 08/14/2015   Depression    Gonorrhea 09/08/11   Headache    "weekly" (11/06/2016)   Hemorrhoids, internal, with bleeding 08/25/2011   Hepatitis C    Herpes    HIV infection (HCC)    Hypertension    ILEITIS 09/24/2010   Qualifier: Diagnosis of  By: Candice Camp CMA (AAMA), Dottie     Lymphoid hyperplasia, reactive    Marijuana abuse    Migraine    "monthly" (11/06/2016)   Pseudoseizures    Seizures (HCC)    "don't know why I have them" (11/06/2016)   Syphilis     Past Surgical History:  Procedure Laterality Date   ANKLE SURGERY Bilateral 2005   Screw & Pins   ANKLE SURGERY Right 2008   "replaced pins & screws"   FOOT SURGERY Bilateral 2005   "bone spurs"   I & D EXTREMITY Right 06/26/2016   Procedure: IRRIGATION AND DEBRIDEMENT RIGHT FOREARM;  Surgeon: Betha Loa, MD;  Location: [facility] OR;  Service: Orthopedics;  Laterality: Right;   I & D EXTREMITY Right 11/04/2016   Procedure: IRRIGATION AND DEBRIDEMENT EXTREMITY;  Surgeon: Dairl Ponder, MD;  Location: [facility] OR;  Service: Orthopedics;  Laterality: Right;   I & D EXTREMITY Right 11/06/2016   Procedure: IRRIGATION AND DEBRIDEMENT right forarm;  Surgeon: Dairl Ponder, MD;  Location: [facility] OR;  Service: Orthopedics;  Laterality: Right;   TEE WITHOUT CARDIOVERSION N/A 11/19/2017   Procedure: TRANSESOPHAGEAL ECHOCARDIOGRAM (TEE);  Surgeon: Pricilla Riffle, MD;  Location: [facility] ENDOSCOPY;  Service: Cardiovascular;  Laterality: N/A;   VIDEO ASSISTED THORACOSCOPY (VATS)/EMPYEMA Left 11/26/2017   Procedure: VIDEO ASSISTED THORACOSCOPY (VATS)/DRAIN EMPYEMA;  Surgeon: Kerin Perna, MD;  Location: [facility] OR;  Service: Thoracic;  Laterality: Left;   Soc Hx - single, lives with room mate. Was to start a new job today. Family in [place]   reports that she has been smoking cigarettes. She has a 16.50 pack-year smoking history. She has never used smokeless tobacco. She  reports current alcohol use. She reports current drug use. Frequency: 2.00 times per week. Drugs: Cocaine, Benzodiazepines, Oxycodone, Marijuana, and Heroin.  Allergies  Allergen Reactions   Acetaminophen Diarrhea and Nausea And Vomiting    Flares up crohns disease Flares up crohns disease Flares up crohns disease   Other Other (See Comments)    IV CONTRAST IV CONTRAST   Vicodin [Hydrocodone-Acetaminophen] Nausea And Vomiting   Dilaudid [Hydromorphone Hcl] Hives and Rash    Family History  Problem Relation Age of Onset   Diabetes Mother    Irritable bowel syndrome Mother    Hypertension Mother    Hyperlipidemia Mother    Hypothyroidism Mother    Diabetes Maternal Uncle    Heart disease Maternal Grandmother    Leukemia Other        maternal greatgrandmother   Colon cancer Neg Hx     Prior to Admission medications   Medication Sig Start Date End Date Taking? Authorizing Provider  acetaminophen (TYLENOL) 325 MG tablet Take 650 mg by mouth every 6 (six) hours as needed.   Yes [provider]  bictegravir-emtricitabine-tenofovir AF (BIKTARVY) 50-200-25 MG TABS tablet Take 1 tablet by mouth daily. 02/12/22  Yes Cliffton Asters, MD  valACYclovir (VALTREX) 500 MG tablet Take 1 tablet by mouth 2 times daily 05/26/22  Yes Cliffton Asters, MD  FLUoxetine (PROZAC) 10 MG capsule Take 1 capsule (10 mg total) by mouth daily. Patient not taking: Reported on 10/11/2019 10/12/18   Malvin Johns, MD  OLANZapine (ZYPREXA) 10 MG tablet Take 1 tablet (10 mg total) by mouth at bedtime. Patient not taking: Reported on 08/15/2019 10/11/18   Malvin Johns, MD  omega-3 acid ethyl esters (LOVAZA) 1 g capsule Take 1 capsule (1 g total) by mouth 2 (two) times daily. Patient not taking: Reported on 08/15/2019 10/11/18   Malvin Johns, MD  ondansetron (ZOFRAN ODT) 4 MG disintegrating tablet Take 1 tablet (4 mg total) by mouth every 8 (eight) hours as needed for nausea or vomiting. Patient not taking:  Reported on 11/05/2020 02/29/20   Kuppelweiser, Cassie L, RPH-CPP  divalproex (DEPAKOTE) 500 MG DR tablet Take 1 tablet (500 mg total) by mouth 2 (two) times daily. 02/28/16 02/28/16  Lindalou Hose, MD    Physical Exam: Vitals:   08/18/22 1055 08/18/22 1217 08/18/22 1300 08/18/22 1540  BP:   130/81 (!) 140/81  Pulse:   73 77  Resp:   12 19  Temp: (!) 102.3 F (39.1 C) 98.9 F (37.2 C)  98.6 F (37 C)  TempSrc: Oral Oral  Oral  SpO2:   98% 98%  Weight:      Height:        Physical Exam Vitals and nursing note reviewed.  Constitutional:      General: She is not in acute distress.    Appearance: She is well-developed and normal weight.     Comments: Appears ill, a bit thin  HENT:     Head: Normocephalic and atraumatic.  Eyes:  Extraocular Movements: Extraocular movements intact.     Pupils: Pupils are equal, round, and reactive to light.     Comments: Bulbar conjunctiva injected  Neck:     Thyroid: No thyromegaly.  Cardiovascular:     Rate and Rhythm: Normal rate and regular rhythm.     Pulses:          Carotid pulses are 2+ on the right side and 2+ on the left side.      Radial pulses are 2+ on the right side and 2+ on the left side.       Dorsalis pedis pulses are 2+ on the right side and 2+ on the left side.       Posterior tibial pulses are 2+ on the right side and 2+ on the left side.     Heart sounds: Normal heart sounds.     Comments: Split S1 Pulmonary:     Effort: Pulmonary effort is normal. Tachypnea present. No accessory muscle usage or respiratory distress.     Breath sounds: Examination of the right-middle field reveals rhonchi. Examination of the right-lower field reveals rhonchi. Examination of the left-lower field reveals rhonchi. Rhonchi present. No decreased breath sounds, wheezing or rales.  Chest:     Chest wall: No mass or tenderness.  Abdominal:     General: Bowel sounds are normal.     Palpations: Abdomen is soft. There is no hepatomegaly or mass.   Musculoskeletal:        General: Normal range of motion.     Cervical back: Normal range of motion and neck supple.  Lymphadenopathy:     Cervical: No cervical adenopathy.  Skin:    General: Skin is warm and dry.  Neurological:     General: No focal deficit present.     Mental Status: She is alert and oriented to person, place, and time.     Cranial Nerves: No cranial nerve deficit.  Psychiatric:        Mood and Affect: Mood is anxious.        Behavior: Behavior normal.      Labs on Admission: I have personally reviewed following labs and imaging studies  CBC: Recent Labs  Lab 08/18/22 0110  WBC 21.1*  HGB 16.4  HCT 47.9  MCV 84.9  PLT 304   Basic Metabolic Panel: Recent Labs  Lab 08/18/22 0110  NA 135  K 3.7  CL 102  CO2 24  GLUCOSE 124*  BUN 16  CREATININE 1.13  CALCIUM 9.2   GFR: Estimated Creatinine Clearance (by C-G formula based on SCr of 1.13 mg/dL) Male: 93.5 mL/min Male: 101.8 mL/min Liver Function Tests: Recent Labs  Lab 08/18/22 0327  AST 42*  ALT 53*  ALKPHOS 70  BILITOT 1.0  PROT 8.5*  ALBUMIN 4.1   No results for input(s): "LIPASE", "AMYLASE" in the last 168 hours. No results for input(s): "AMMONIA" in the last 168 hours. Coagulation Profile: No results for input(s): "INR", "PROTIME" in the last 168 hours. Cardiac Enzymes: No results for input(s): "CKTOTAL", "CKMB", "CKMBINDEX", "TROPONINI" in the last 168 hours. BNP (last 3 results) No results for input(s): "PROBNP" in the last 8760 hours. HbA1C: No results for input(s): "HGBA1C" in the last 72 hours. CBG: No results for input(s): "GLUCAP" in the last 168 hours. Lipid Profile: No results for input(s): "CHOL", "HDL", "LDLCALC", "TRIG", "CHOLHDL", "LDLDIRECT" in the last 72 hours. Thyroid Function Tests: No results for input(s): "TSH", "T4TOTAL", "FREET4", "T3FREE", "THYROIDAB" in the last 72  hours. Anemia Panel: No results for input(s): "VITAMINB12", "FOLATE", "FERRITIN",  "TIBC", "IRON", "RETICCTPCT" in the last 72 hours. Urine analysis:    Component Value Date/Time   COLORURINE YELLOW 08/18/2022 0532   APPEARANCEUR CLEAR 08/18/2022 0532   LABSPEC 1.018 08/18/2022 0532   PHURINE 7.0 08/18/2022 0532   GLUCOSEU NEGATIVE 08/18/2022 0532   HGBUR NEGATIVE 08/18/2022 0532   BILIRUBINUR NEGATIVE 08/18/2022 0532   KETONESUR NEGATIVE 08/18/2022 0532   PROTEINUR NEGATIVE 08/18/2022 0532   UROBILINOGEN 1.0 08/04/2015 1935   NITRITE NEGATIVE 08/18/2022 0532   LEUKOCYTESUR TRACE (A) 08/18/2022 0532    Radiological Exams on Admission: I have personally reviewed images CT Chest Wo Contrast  Result Date: 08/18/2022 CLINICAL DATA:  Chest pain with respiratory illness. Negative PA Lat chest today. Flu-like symptoms since yesterday morning. Also subjective fever, chills, and body aches with productive cough. EXAM: CT CHEST WITHOUT CONTRAST TECHNIQUE: Multidetector CT imaging of the chest was performed following the standard protocol without IV contrast. RADIATION DOSE REDUCTION: This exam was performed according to the departmental dose-optimization program which includes automated exposure control, adjustment of the mA and/or kV according to patient size and/or use of iterative reconstruction technique. COMPARISON:  Chest CT with IV contrast 11/23/2017, PA and lateral chest radiographs today, chest radiograph 11/30/2017. FINDINGS: Cardiovascular: The heart is slightly enlarged. There is no pericardial effusion and no visible coronary artery calcifications. The aorta and great vessels are unremarkable without contrast. The pulmonary arteries and veins are normal in caliber. Mediastinum/Nodes: There are borderline prominent AP window and right paratracheal lymph nodes up to 8 mm short axis. Assessment for hilar adenopathy is limited without IV contrast but no obvious hilar mass is evident or further mediastinal adenopathy. No thyroid or axillary mass is seen. Thoracic esophagus  thoracic trachea are unremarkable. Lungs/Pleura: Mild elevation right hemidiaphragm. There is no pleural effusion, thickening or pneumothorax. There is bronchial thickening in the lower lobes, scattered small basilar bronchiolar impactions. There is patchy consolidation in the lower lung fields, including in the lingular base, right middle lobe base, left lower lobe medial basal segment right lower lobe lateral basal segment, findings consistent with multilobar pneumonia. Both upper lobes are generally clear except for the lingular consolidation. There is minimal paraseptal emphysema in the lung apices. Upper Abdomen: No acute abnormality. Musculoskeletal: No chest wall mass or suspicious bone lesions identified. IMPRESSION: 1. Multilobar pneumonia in the lower lung fields, with bronchial thickening and scattered small basilar bronchiolar impactions. Follow-up CT recommended after treatment to ensure clearing. 2. Borderline prominent mediastinal lymph nodes. 3. Minimal paraseptal emphysema in the lung apices. 4. Mild cardiomegaly. Emphysema (ICD10-J43.9). Electronically Signed   By: Almira Bar M.D.   On: 08/18/2022 03:58   DG Chest 2 View  Result Date: 08/18/2022 CLINICAL DATA:  Chest pain EXAM: CHEST - 2 VIEW COMPARISON:  11/30/2017 FINDINGS: The heart size and mediastinal contours are within normal limits. Both lungs are clear. The visualized skeletal structures are unremarkable. IMPRESSION: Negative. Electronically Signed   By: Charlett Nose M.D.   On: 08/18/2022 01:53    EKG: I have personally reviewed EKG: NSR, RAE, Pulmonary disease pattern, no acute change no STEMI  Assessment/Plan Principal Problem:   Multifocal pneumonia Active Problems:   Depression   HIV disease (HCC)    Assessment and Plan: * Multifocal pneumonia Patient with a week long illness with fever and cough, headache, malaise. His symptoms became acutely worse on the day of admission. EMS activated and he was transported to  [facility]  where he was found to be febrile to 101.8, CT revealed multi-lobar PNA, and leukocytosis to 21.1. He has HIV followed in the ID clinic. He was out of medication due to loss of insurance but resumed HIV meds recently. In ED he received 1L IVF and was administered 2 g Rocephin and 500 mg azithromycin.  Plan Additional studies ordered: R ag for legionella and Strep pneuomo; smear for pneumocystis  Continue Rocephin 2 g IV q24 and Azithromycin 500 mg PO daily  Septra IV 20 mg/kg q 8  ID will see 08/19/22    HIV disease ([place]) Patient followed in ID clinic  Plan Resume prior HIV meds.   Depression Patient appears stable.  Plan Resume meds per medication record       DVT prophylaxis: Lovenox Code Status: Full Code Family Communication: left message for Malka So (mother)  Disposition Plan: home when stable  Consults called: ID - Dr. Baxter Flattery  Admission status: Inpatient, Med-Surg   Adella Hare, MD [facility] 08/18/2022, 8:04 PM

## 2022-08-18 NOTE — Assessment & Plan Note (Signed)
Patient appears stable.  Plan Resume meds per medication record

## 2022-08-19 DIAGNOSIS — F331 Major depressive disorder, recurrent, moderate: Secondary | ICD-10-CM

## 2022-08-19 DIAGNOSIS — B20 Human immunodeficiency virus [HIV] disease: Secondary | ICD-10-CM

## 2022-08-19 LAB — RESPIRATORY PANEL BY PCR

## 2022-08-19 LAB — MRSA NEXT GEN BY PCR, NASAL: MRSA by PCR Next Gen: DETECTED — AB

## 2022-08-19 LAB — URINE CULTURE: Culture: NO GROWTH

## 2022-08-19 LAB — RPR
RPR Ser Ql: REACTIVE — AB
RPR Titer: 1:4 {titer}

## 2022-08-19 LAB — STREP PNEUMONIAE URINARY ANTIGEN: Strep Pneumo Urinary Antigen: NEGATIVE

## 2022-08-19 LAB — T-HELPER CELLS (CD4) COUNT (NOT AT ARMC)
CD4 % Helper T Cell: 34 % (ref 33–65)
CD4 T Cell Abs: 521 /uL (ref 400–1790)

## 2022-08-19 MED ORDER — CHLORHEXIDINE GLUCONATE CLOTH 2 % EX PADS
6.0000 | MEDICATED_PAD | Freq: Every day | CUTANEOUS | Status: DC
  Administered 2022-08-20: 6 via TOPICAL

## 2022-08-19 MED ORDER — VANCOMYCIN HCL 1250 MG/250ML IV SOLN
1250.0000 mg | Freq: Two times a day (BID) | INTRAVENOUS | Status: DC
  Administered 2022-08-19 – 2022-08-20 (×2): 1250 mg via INTRAVENOUS
  Filled 2022-08-19 (×2): qty 250

## 2022-08-19 MED ORDER — MUPIROCIN 2 % EX OINT
1.0000 | TOPICAL_OINTMENT | Freq: Two times a day (BID) | CUTANEOUS | Status: DC
  Administered 2022-08-19 – 2022-08-21 (×4): 1 via NASAL
  Filled 2022-08-19: qty 22

## 2022-08-19 NOTE — Consult Note (Addendum)
[facility]  Total days of antibiotics 2         Reason for Consult:pneumonia in hiv disease  Referring Physician: Hanley Ben  Principal Problem:   Multifocal pneumonia Active Problems:   Depression   HIV disease (HCC)    HPI: Pedro Owens is a 31 y.o. adult transgender male who has hiv-hcv co-infection followed by dr Orvan Falconer in ID clinic. Recently restarted back on biktarvy after 2 months off of meds. Also hx of treated syphilis. She was admitted for worsening productive cough over the last week. No significant sick contact. No nausea and vomiting/diarrhea/no shortness of breath with ambulation. On admit, she was not hypoxic at room air. Chest CT showing bilateral patchy infiltrates per my read. Labs signficant for leukocytosis of 21K, aslo slighltly elevated AST/ALT in 40s/50s which is her baseline. She was empirically started on ceftriaxone, azithromycin and bactrim. THe patient reports starting to feel better since admissions.  Past Medical History:  Diagnosis Date   ADHD (attention deficit hyperactivity disorder)    Anal warts    Anxiety    Asthma    Bipolar disorder (HCC)    Chronic bronchitis (HCC)    Chronic pain syndrome    Convulsions (HCC) 08/14/2015   Depression    Gonorrhea 09/08/11   Headache    "weekly" (11/06/2016)   Hemorrhoids, internal, with bleeding 08/25/2011   Hepatitis C    Herpes    HIV infection (HCC)    Hypertension    ILEITIS 09/24/2010   Qualifier: Diagnosis of  By: Candice Camp CMA (AAMA), Dottie     Lymphoid hyperplasia, reactive    Marijuana abuse    Migraine    "monthly" (11/06/2016)   Pseudoseizures    Seizures (HCC)    "don't know why I have them" (11/06/2016)   Syphilis     Allergies:  Allergies  Allergen Reactions   Acetaminophen Diarrhea and Nausea And Vomiting    Flares up crohns disease Flares up crohns disease Flares up crohns disease   Other Other (See Comments)    IV CONTRAST IV CONTRAST    Vicodin [Hydrocodone-Acetaminophen] Nausea And Vomiting   Dilaudid [Hydromorphone Hcl] Hives and Rash    Current antibiotics:   MEDICATIONS:  azithromycin  500 mg Oral Daily   bictegravir-emtricitabine-tenofovir AF  1 tablet Oral Daily   enoxaparin (LOVENOX) injection  40 mg Subcutaneous Q24H   FLUoxetine  10 mg Oral Daily   sodium chloride flush  3 mL Intravenous Q12H   valACYclovir  500 mg Oral BID    Social History   Tobacco Use   Smoking status: Every Day    Packs/day: 1.50    Years: 11.00    Total pack years: 16.50    Types: Cigarettes   Smokeless tobacco: Never  Vaping Use   Vaping Use: Never used  Substance Use Topics   Alcohol use: Yes    Comment: occ   Drug use: Yes    Frequency: 2.0 times per week    Types: Cocaine, Benzodiazepines, Oxycodone, Marijuana, Heroin    Comment: "just marijuana"     Family History  Problem Relation Age of Onset   Diabetes Mother    Irritable bowel syndrome Mother    Hypertension Mother    Hyperlipidemia Mother    Hypothyroidism Mother    Diabetes Maternal Uncle    Heart disease Maternal Grandmother    Leukemia Other        maternal greatgrandmother   Colon cancer Neg  Hx     Review of Systems -  12 point ros is negative except what is mentioned in hpi   OBJECTIVE: Temp:  [97.6 F (36.4 C)-102.7 F (39.3 C)] 98.3 F (36.8 C) (11/01 1715) Pulse Rate:  [59-97] 65 (11/01 1715) Resp:  [16-18] 16 (11/01 1715) BP: (122-141)/(76-86) 130/86 (11/01 1715) SpO2:  [98 %-99 %] 99 % (11/01 1715) Physical Exam  Constitutional:  oriented to person, place, and time. appears well-developed and well-nourished. No distress.  HENT: [place]/AT, PERRLA, no scleral icterus Mouth/Throat: Oropharynx is clear and moist. No oropharyngeal exudate.  Cardiovascular: Normal rate, regular rhythm and normal heart sounds. Exam reveals no gallop and no friction rub.  No murmur heard.  Pulmonary/Chest: Effort normal and breath sounds normal. No  respiratory distress.  has no wheezes.  Neck = supple, no nuchal rigidity Abdominal: Soft. Bowel sounds are normal.  exhibits no distension. There is no tenderness.  Lymphadenopathy: no cervical adenopathy. No axillary adenopathy Neurological: alert and oriented to person, place, and time.  Skin: Skin is warm and dry. No rash noted. No erythema.  Psychiatric: a normal mood and affect.  behavior is normal.   LABS: Results for orders placed or performed during the hospital encounter of 08/18/22 (from the past 48 hour(s))  Basic metabolic panel     Status: Abnormal   Collection Time: 08/18/22  1:10 AM  Result Value Ref Range   Sodium 135 135 - 145 mmol/L   Potassium 3.7 3.5 - 5.1 mmol/L   Chloride 102 98 - 111 mmol/L   CO2 24 22 - 32 mmol/L   Glucose, Bld 124 (H) 70 - 99 mg/dL    Comment: Glucose reference range applies only to samples taken after fasting for at least 8 hours.   BUN 16 6 - 20 mg/dL   Creatinine, Ser 2.77 0.61 - 1.24 mg/dL   Calcium 9.2 8.9 - 82.4 mg/dL   GFR, Estimated >23 >53 mL/min    Comment: (NOTE) Calculated using the CKD-EPI Creatinine Equation (2021)    Anion gap 9 5 - 15    Comment: Performed at [facility], [address] [place], [place] [address]  CBC     Status: Abnormal   Collection Time: 08/18/22  1:10 AM  Result Value Ref Range   WBC 21.1 (H) 4.0 - 10.5 K/uL   RBC 5.64 4.22 - 5.81 MIL/uL   Hemoglobin 16.4 13.0 - 17.0 g/dL   HCT 15.4 00.8 - 67.6 %   MCV 84.9 80.0 - 100.0 fL   MCH 29.1 26.0 - 34.0 pg   MCHC 34.2 30.0 - 36.0 g/dL   RDW 19.5 09.3 - 26.7 %   Platelets 304 150 - 400 K/uL   nRBC 0.0 0.0 - 0.2 %    Comment: Performed at [facility], [address] [place], [place] [address]  Troponin I (High Sensitivity)     Status: None   Collection Time: 08/18/22  1:10 AM  Result Value Ref Range   Troponin I (High Sensitivity) 4 <18 ng/L    Comment: (NOTE) Elevated high sensitivity troponin I (hsTnI)  values and significant  changes across serial measurements may suggest ACS but many other  chronic and acute conditions are known to elevate hsTnI results.  Refer to the "Links" section for chest pain algorithms and additional  guidance. Performed at [facility], [address], [place], [place] [address]   Resp Panel by RT-PCR (Flu A&B, Covid) Anterior Nasal Swab  Status: None   Collection Time: 08/18/22  1:10 AM   Specimen: Anterior Nasal Swab  Result Value Ref Range   SARS Coronavirus 2 by RT PCR NEGATIVE NEGATIVE    Comment: (NOTE) SARS-CoV-2 target nucleic acids are NOT DETECTED.  The SARS-CoV-2 RNA is generally detectable in upper respiratory specimens during the acute phase of infection. The lowest concentration of SARS-CoV-2 viral copies this assay can detect is 138 copies/mL. A negative result does not preclude SARS-Cov-2 infection and should not be used as the sole basis for treatment or other patient management decisions. A negative result may occur with  improper specimen collection/handling, submission of specimen other than nasopharyngeal swab, presence of viral mutation(s) within the areas targeted by this assay, and inadequate number of viral copies(<138 copies/mL). A negative result must be combined with clinical observations, patient history, and epidemiological information. The expected result is Negative.  Fact Sheet for Patients:  BloggerCourse.com  Fact Sheet for Healthcare Providers:  SeriousBroker.it  This test is no t yet approved or cleared by the [place] FDA and  has been authorized for detection and/or diagnosis of SARS-CoV-2 by FDA under an Emergency Use Authorization (EUA). This EUA will remain  in effect (meaning this test can be used) for the duration of the COVID-19 declaration under Section 564(b)(1) of the Act, 21 U.S.C.section 360bbb-3(b)(1), unless the  authorization is terminated  or revoked sooner.       Influenza A by PCR NEGATIVE NEGATIVE   Influenza B by PCR NEGATIVE NEGATIVE    Comment: (NOTE) The Xpert Xpress SARS-CoV-2/FLU/RSV plus assay is intended as an aid in the diagnosis of influenza from Nasopharyngeal swab specimens and should not be used as a sole basis for treatment. Nasal washings and aspirates are unacceptable for Xpert Xpress SARS-CoV-2/FLU/RSV testing.  Fact Sheet for Patients: BloggerCourse.com  Fact Sheet for Healthcare Providers: SeriousBroker.it  This test is not yet approved or cleared by the [place] FDA and has been authorized for detection and/or diagnosis of SARS-CoV-2 by FDA under an Emergency Use Authorization (EUA). This EUA will remain in effect (meaning this test can be used) for the duration of the COVID-19 declaration under Section 564(b)(1) of the Act, 21 U.S.C. section 360bbb-3(b)(1), unless the authorization is terminated or revoked.  Performed at [facility], [address] [place], [place] [address]   Lactic acid, plasma     Status: None   Collection Time: 08/18/22  3:14 AM  Result Value Ref Range   Lactic Acid, Venous 0.8 0.5 - 1.9 mmol/L    Comment: Performed at [facility], [address] [place], [place] [address]  Troponin I (High Sensitivity)     Status: None   Collection Time: 08/18/22  3:27 AM  Result Value Ref Range   Troponin I (High Sensitivity) 4 <18 ng/L    Comment: (NOTE) Elevated high sensitivity troponin I (hsTnI) values and significant  changes across serial measurements may suggest ACS but many other  chronic and acute conditions are known to elevate hsTnI results.  Refer to the "Links" section for chest pain algorithms and additional  guidance. Performed at [facility], [address] [place], [place] [address]   Blood Culture (routine x  2)     Status: None (Preliminary result)   Collection Time: 08/18/22  3:27 AM   Specimen: BLOOD  Result Value Ref Range   Specimen Description      BLOOD LEFT ARM Performed at [facility], [address],  [place], [place] [address]    Special Requests      BOTTLES DRAWN AEROBIC AND ANAEROBIC Blood Culture adequate volume Performed at [facility], [address] [place], [place] [address]    Culture      NO GROWTH 1 DAY Performed at [facility], [address], [place], [place] [address]    Report Status PENDING   Hepatic function panel     Status: Abnormal   Collection Time: 08/18/22  3:27 AM  Result Value Ref Range   Total Protein 8.5 (H) 6.5 - 8.1 g/dL   Albumin 4.1 3.5 - 5.0 g/dL   AST 42 (H) 15 - 41 U/L   ALT 53 (H) 0 - 44 U/L   Alkaline Phosphatase 70 38 - 126 U/L   Total Bilirubin 1.0 0.3 - 1.2 mg/dL   Bilirubin, Direct 0.3 (H) 0.0 - 0.2 mg/dL   Indirect Bilirubin 0.7 0.3 - 0.9 mg/dL    Comment: Performed at [facility], [address] [place], [place] [address]  Blood Culture (routine x 2)     Status: None (Preliminary result)   Collection Time: 08/18/22  3:48 AM   Specimen: BLOOD  Result Value Ref Range   Specimen Description      BLOOD BLOOD LEFT FOREARM Performed at [facility], [address] [place], [place] [address]    Special Requests      BOTTLES DRAWN AEROBIC ONLY Blood Culture results may not be optimal due to an inadequate volume of blood received in culture bottles Performed at [facility], [address] [place], [place] [address]    Culture      NO GROWTH 1 DAY Performed at [facility], [address], [place], [place] [address]    Report Status PENDING   Urinalysis, Routine w reflex microscopic     Status: Abnormal   Collection Time: 08/18/22  5:32 AM  Result Value Ref Range   Color, Urine YELLOW YELLOW   APPearance CLEAR  CLEAR   Specific Gravity, Urine 1.018 1.005 - 1.030   pH 7.0 5.0 - 8.0   Glucose, UA NEGATIVE NEGATIVE mg/dL   Hgb urine dipstick NEGATIVE NEGATIVE   Bilirubin Urine NEGATIVE NEGATIVE   Ketones, ur NEGATIVE NEGATIVE mg/dL   Protein, ur NEGATIVE NEGATIVE mg/dL   Nitrite NEGATIVE NEGATIVE   Leukocytes,Ua TRACE (A) NEGATIVE   RBC / HPF 0-5 0 - 5 RBC/hpf   WBC, UA 6-10 0 - 5 WBC/hpf   Mucus PRESENT     Comment: Performed at [facility], [address] [place], [place] [address]  Urine Culture     Status: None   Collection Time: 08/18/22  5:32 AM   Specimen: In/Out Cath Urine  Result Value Ref Range   Specimen Description      IN/OUT CATH URINE Performed at [facility], [address], [place], [place] [address]    Special Requests      NONE Performed at [facility], [address], [place], [place] [address]    Culture      NO GROWTH Performed at [facility], [address] [place], [place] [address]    Report Status 08/19/2022 FINAL   Respiratory (~20 pathogens) panel by PCR     Status: None   Collection Time: 08/18/22  7:06 PM   Specimen: Sputum; Respiratory  Result Value Ref Range   Adenovirus NOT DETECTED NOT DETECTED   Coronavirus 229E NOT  DETECTED NOT DETECTED    Comment: (NOTE) The Coronavirus on the Respiratory Panel, DOES NOT test for the novel  Coronavirus (2019 nCoV)    Coronavirus HKU1 NOT DETECTED NOT DETECTED   Coronavirus NL63 NOT DETECTED NOT DETECTED   Coronavirus OC43 NOT DETECTED NOT DETECTED   Metapneumovirus NOT DETECTED NOT DETECTED   Rhinovirus / Enterovirus NOT DETECTED NOT DETECTED   Influenza A NOT DETECTED NOT DETECTED   Influenza B NOT DETECTED NOT DETECTED   Parainfluenza Virus 1 NOT DETECTED NOT DETECTED   Parainfluenza Virus 2 NOT DETECTED NOT DETECTED   Parainfluenza Virus 3 NOT DETECTED NOT DETECTED   Parainfluenza Virus 4 NOT DETECTED NOT DETECTED   Respiratory  Syncytial Virus NOT DETECTED NOT DETECTED   Bordetella pertussis NOT DETECTED NOT DETECTED   Bordetella Parapertussis NOT DETECTED NOT DETECTED   Chlamydophila pneumoniae NOT DETECTED NOT DETECTED   Mycoplasma pneumoniae NOT DETECTED NOT DETECTED    Comment: Performed at [facility], [address] [place], [place] [address]  T-helper cells (CD4) count (not at [facility])     Status: None   Collection Time: 08/19/22  2:50 AM  Result Value Ref Range   CD4 T Cell Abs 521 400 - 1,790 /uL   CD4 % Helper T Cell 34 33 - 65 %    Comment: Performed at [facility], [address], [place], [place] [address]  RPR     Status: Abnormal   Collection Time: 08/19/22 11:05 AM  Result Value Ref Range   RPR Ser Ql Reactive (A) NON REACTIVE    Comment: SENT FOR CONFIRMATION   RPR Titer 1:4     Comment: Performed at [facility], [address] [place], [place] [address]    MICRO: reviewed IMAGING: CT Chest Wo Contrast  Result Date: 08/18/2022 CLINICAL DATA:  Chest pain with respiratory illness. Negative PA Lat chest today. Flu-like symptoms since yesterday morning. Also subjective fever, chills, and body aches with productive cough. EXAM: CT CHEST WITHOUT CONTRAST TECHNIQUE: Multidetector CT imaging of the chest was performed following the standard protocol without IV contrast. RADIATION DOSE REDUCTION: This exam was performed according to the departmental dose-optimization program which includes automated exposure control, adjustment of the mA and/or kV according to patient size and/or use of iterative reconstruction technique. COMPARISON:  Chest CT with IV contrast 11/23/2017, PA and lateral chest radiographs today, chest radiograph 11/30/2017. FINDINGS: Cardiovascular: The heart is slightly enlarged. There is no pericardial effusion and no visible coronary artery calcifications. The aorta and great vessels are unremarkable without contrast. The pulmonary arteries and veins are  normal in caliber. Mediastinum/Nodes: There are borderline prominent AP window and right paratracheal lymph nodes up to 8 mm short axis. Assessment for hilar adenopathy is limited without IV contrast but no obvious hilar mass is evident or further mediastinal adenopathy. No thyroid or axillary mass is seen. Thoracic esophagus thoracic trachea are unremarkable. Lungs/Pleura: Mild elevation right hemidiaphragm. There is no pleural effusion, thickening or pneumothorax. There is bronchial thickening in the lower lobes, scattered small basilar bronchiolar impactions. There is patchy consolidation in the lower lung fields, including in the lingular base, right middle lobe base, left lower lobe medial basal segment right lower lobe lateral basal segment, findings consistent with multilobar pneumonia. Both upper lobes are generally clear except for the lingular consolidation. There is minimal paraseptal emphysema in the lung apices. Upper Abdomen: No acute abnormality. Musculoskeletal: No chest wall mass or suspicious bone lesions identified.  IMPRESSION: 1. Multilobar pneumonia in the lower lung fields, with bronchial thickening and scattered small basilar bronchiolar impactions. Follow-up CT recommended after treatment to ensure clearing. 2. Borderline prominent mediastinal lymph nodes. 3. Minimal paraseptal emphysema in the lung apices. 4. Mild cardiomegaly. Emphysema (ICD10-J43.9). Electronically Signed   By: Telford Nab M.D.   On: 08/18/2022 03:58   DG Chest 2 View  Result Date: 08/18/2022 CLINICAL DATA:  Chest pain EXAM: CHEST - 2 VIEW COMPARISON:  11/30/2017 FINDINGS: The heart size and mediastinal contours are within normal limits. Both lungs are clear. The visualized skeletal structures are unremarkable. IMPRESSION: Negative. Electronically Signed   By: Rolm Baptise M.D.   On: 08/18/2022 01:53     Assessment/Plan:  31yo TF with hiv disease, cd 4 count of 500 in April 2023, on biktarvy admitted with CAP.  Concern by appearance for legionella, possibly mrsa. Less likely pneumocystitis since cd 4 count < 200.  = will continue on ceftriaxone plus azithromycin = will stop bactrim and start vancomycin and check for MRSA colonization  Syphilis = rpr of 1:4 , at baseline. Does not need further treatment  HIV disease= will check cd 4 count and HIV VL

## 2022-08-19 NOTE — Progress Notes (Signed)
PROGRESS NOTE    Pedro HENSLEE  FAO:[id number] DOB: 1991-03-26 DOA: 08/18/2022 PCP: Diamantina Providence, FNP   Brief Narrative:  31 year old transgender male with history of HIV, medical noncompliance, hepatitis C, bipolar disorder, depression, pneumonia with empyema requiring chest tube presented with cough, fever and chest pain.  On presentation, he was febrile to 101.8 with leukocytosis to 21.1.  CT revealed multilobar pneumonia.  He was started on IV fluids and antibiotics.  Assessment & Plan:   Multifocal pneumonia -Currently on Rocephin, Zithromax.  Also on IV Bactrim to cover for pneumocystis. -Follow ID recommendations  HIV Medical noncompliance -Patient apparently was recently restarted on antiretrovirals by ID. -Continue Biktarvy  Depression -Continue sertraline  Leukocytosis -Repeat a.m. labs  Elevated LFTs -Repeat a.m. labs  DVT prophylaxis: Lovenox Code Status: Full Family Communication: None at bedside Disposition Plan: Status is: Observation The patient will require care spanning > 2 midnights and should be moved to inpatient because: Of need for IV antibiotics  Consultants: ID  Procedures: None  Antimicrobials: Rocephin, Zithromax and Bactrim from 08/18/2022 onwards Biktarvy and Valtrex   Subjective: Patient seen and examined at bedside.  Still feels short of breath with exertion along with cough.  Poor historian.  No fever, vomiting reported.  Objective: Vitals:   08/18/22 2032 08/18/22 2344 08/19/22 0505 08/19/22 0907  BP: (!) 141/86  131/76 122/80  Pulse: 97  67 (!) 59  Resp: 18  18 16   Temp: (!) 102.7 F (39.3 C) 98.7 F (37.1 C) 97.6 F (36.4 C) 98.3 F (36.8 C)  TempSrc: Oral  Oral Oral  SpO2: 98%  99% 99%  Weight:      Height:        Intake/Output Summary (Last 24 hours) at 08/19/2022 1249 Last data filed at 08/19/2022 0900 Gross per 24 hour  Intake 1620 ml  Output --  Net 1620 ml   Filed Weights   08/18/22 0059  Weight:  79.4 kg    Examination:  General exam: Appears calm and comfortable.  Currently on room air. Respiratory system: Bilateral decreased breath sounds at bases with some scattered crackles Cardiovascular system: S1 & S2 heard, Rate controlled Gastrointestinal system: Abdomen is nondistended, soft and nontender. Normal bowel sounds heard. Extremities: No cyanosis, clubbing, edema  Central nervous system: Sleepy, wakes up slightly, very slow to respond, poor historian.  No focal neurological deficits. Moving extremities Skin: No rashes, lesions or ulcers Psychiatry: Extremely flat affect.  No signs of agitation.   Data Reviewed: I have personally reviewed following labs and imaging studies  CBC: Recent Labs  Lab 08/18/22 0110  WBC 21.1*  HGB 16.4  HCT 47.9  MCV 84.9  PLT 304   Basic Metabolic Panel: Recent Labs  Lab 08/18/22 0110  NA 135  K 3.7  CL 102  CO2 24  GLUCOSE 124*  BUN 16  CREATININE 1.13  CALCIUM 9.2   GFR: Estimated Creatinine Clearance (by C-G formula based on SCr of 1.13 mg/dL) Male: 08/20/22 mL/min Male: 101.8 mL/min Liver Function Tests: Recent Labs  Lab 08/18/22 0327  AST 42*  ALT 53*  ALKPHOS 70  BILITOT 1.0  PROT 8.5*  ALBUMIN 4.1   No results for input(s): "LIPASE", "AMYLASE" in the last 168 hours. No results for input(s): "AMMONIA" in the last 168 hours. Coagulation Profile: No results for input(s): "INR", "PROTIME" in the last 168 hours. Cardiac Enzymes: No results for input(s): "CKTOTAL", "CKMB", "CKMBINDEX", "TROPONINI" in the last 168 hours. BNP (last 3 results) No  results for input(s): "PROBNP" in the last 8760 hours. HbA1C: No results for input(s): "HGBA1C" in the last 72 hours. CBG: No results for input(s): "GLUCAP" in the last 168 hours. Lipid Profile: No results for input(s): "CHOL", "HDL", "LDLCALC", "TRIG", "CHOLHDL", "LDLDIRECT" in the last 72 hours. Thyroid Function Tests: No results for input(s): "TSH", "T4TOTAL",  "FREET4", "T3FREE", "THYROIDAB" in the last 72 hours. Anemia Panel: No results for input(s): "VITAMINB12", "FOLATE", "FERRITIN", "TIBC", "IRON", "RETICCTPCT" in the last 72 hours. Sepsis Labs: Recent Labs  Lab 08/18/22 0314  LATICACIDVEN 0.8    Recent Results (from the past 240 hour(s))  Resp Panel by RT-PCR (Flu A&B, Covid) Anterior Nasal Swab     Status: None   Collection Time: 08/18/22  1:10 AM   Specimen: Anterior Nasal Swab  Result Value Ref Range Status   SARS Coronavirus 2 by RT PCR NEGATIVE NEGATIVE Final    Comment: (NOTE) SARS-CoV-2 target nucleic acids are NOT DETECTED.  The SARS-CoV-2 RNA is generally detectable in upper respiratory specimens during the acute phase of infection. The lowest concentration of SARS-CoV-2 viral copies this assay can detect is 138 copies/mL. A negative result does not preclude SARS-Cov-2 infection and should not be used as the sole basis for treatment or other patient management decisions. A negative result may occur with  improper specimen collection/handling, submission of specimen other than nasopharyngeal swab, presence of viral mutation(s) within the areas targeted by this assay, and inadequate number of viral copies(<138 copies/mL). A negative result must be combined with clinical observations, patient history, and epidemiological information. The expected result is Negative.  Fact Sheet for Patients:  BloggerCourse.com  Fact Sheet for Healthcare Providers:  SeriousBroker.it  This test is no t yet approved or cleared by the [place] FDA and  has been authorized for detection and/or diagnosis of SARS-CoV-2 by FDA under an Emergency Use Authorization (EUA). This EUA will remain  in effect (meaning this test can be used) for the duration of the COVID-19 declaration under Section 564(b)(1) of the Act, 21 U.S.C.section 360bbb-3(b)(1), unless the authorization is terminated  or  revoked sooner.       Influenza A by PCR NEGATIVE NEGATIVE Final   Influenza B by PCR NEGATIVE NEGATIVE Final    Comment: (NOTE) The Xpert Xpress SARS-CoV-2/FLU/RSV plus assay is intended as an aid in the diagnosis of influenza from Nasopharyngeal swab specimens and should not be used as a sole basis for treatment. Nasal washings and aspirates are unacceptable for Xpert Xpress SARS-CoV-2/FLU/RSV testing.  Fact Sheet for Patients: BloggerCourse.com  Fact Sheet for Healthcare Providers: SeriousBroker.it  This test is not yet approved or cleared by the [place] FDA and has been authorized for detection and/or diagnosis of SARS-CoV-2 by FDA under an Emergency Use Authorization (EUA). This EUA will remain in effect (meaning this test can be used) for the duration of the COVID-19 declaration under Section 564(b)(1) of the Act, 21 U.S.C. section 360bbb-3(b)(1), unless the authorization is terminated or revoked.  Performed at [facility], [address] [place], [place] [address]   Blood Culture (routine x 2)     Status: None (Preliminary result)   Collection Time: 08/18/22  3:27 AM   Specimen: BLOOD  Result Value Ref Range Status   Specimen Description   Final    BLOOD LEFT ARM Performed at [facility], [address] [place], [place] [address]    Special Requests   Final    BOTTLES DRAWN AEROBIC AND ANAEROBIC Blood Culture  adequate volume Performed at [facility], [address] [place], [place] [address]    Culture   Final    NO GROWTH 1 DAY Performed at [facility], [place] [address] [place], [place] [address]    Report Status PENDING  Incomplete  Blood Culture (routine x 2)     Status: None (Preliminary result)   Collection Time: 08/18/22  3:48 AM   Specimen: BLOOD  Result Value Ref Range Status   Specimen Description   Final    BLOOD BLOOD LEFT  FOREARM Performed at [facility], [address] [place], [place] [address]    Special Requests   Final    BOTTLES DRAWN AEROBIC ONLY Blood Culture results may not be optimal due to an inadequate volume of blood received in culture bottles Performed at [facility], [address] [place], [place] [address]    Culture   Final    NO GROWTH 1 DAY Performed at [facility], [place] [address] [place], [place] [address]    Report Status PENDING  Incomplete  Urine Culture     Status: None   Collection Time: 08/18/22  5:32 AM   Specimen: In/Out Cath Urine  Result Value Ref Range Status   Specimen Description   Final    IN/OUT CATH URINE Performed at [facility], [address] [place], [place] [address]    Special Requests   Final    NONE Performed at [facility], [address], [place], [place] [address]    Culture   Final    NO GROWTH Performed at [facility], [place] [address] [place], [place] [address]    Report Status 08/19/2022 FINAL  Final         Radiology Studies: CT Chest Wo Contrast  Result Date: 08/18/2022 CLINICAL DATA:  Chest pain with respiratory illness. Negative PA Lat chest today. Flu-like symptoms since yesterday morning. Also subjective fever, chills, and body aches with productive cough. EXAM: CT CHEST WITHOUT CONTRAST TECHNIQUE: Multidetector CT imaging of the chest was performed following the standard protocol without IV contrast. RADIATION DOSE REDUCTION: This exam was performed according to the departmental dose-optimization program which includes automated exposure control, adjustment of the mA and/or kV according to patient size and/or use of iterative reconstruction technique. COMPARISON:  Chest CT with IV contrast 11/23/2017, PA and lateral chest radiographs today, chest radiograph 11/30/2017. FINDINGS: Cardiovascular: The heart is slightly enlarged. There is no  pericardial effusion and no visible coronary artery calcifications. The aorta and great vessels are unremarkable without contrast. The pulmonary arteries and veins are normal in caliber. Mediastinum/Nodes: There are borderline prominent AP window and right paratracheal lymph nodes up to 8 mm short axis. Assessment for hilar adenopathy is limited without IV contrast but no obvious hilar mass is evident or further mediastinal adenopathy. No thyroid or axillary mass is seen. Thoracic esophagus thoracic trachea are unremarkable. Lungs/Pleura: Mild elevation right hemidiaphragm. There is no pleural effusion, thickening or pneumothorax. There is bronchial thickening in the lower lobes, scattered small basilar bronchiolar impactions. There is patchy consolidation in the lower lung fields, including in the lingular base, right middle lobe base, left lower lobe medial basal segment right lower lobe lateral basal segment, findings consistent with multilobar pneumonia. Both upper lobes are generally clear except for the lingular consolidation. There is minimal paraseptal emphysema in the lung apices. Upper Abdomen: No acute abnormality. Musculoskeletal: No chest wall mass or suspicious bone  lesions identified. IMPRESSION: 1. Multilobar pneumonia in the lower lung fields, with bronchial thickening and scattered small basilar bronchiolar impactions. Follow-up CT recommended after treatment to ensure clearing. 2. Borderline prominent mediastinal lymph nodes. 3. Minimal paraseptal emphysema in the lung apices. 4. Mild cardiomegaly. Emphysema (ICD10-J43.9). Electronically Signed   By: Almira Bar M.D.   On: 08/18/2022 03:58   DG Chest 2 View  Result Date: 08/18/2022 CLINICAL DATA:  Chest pain EXAM: CHEST - 2 VIEW COMPARISON:  11/30/2017 FINDINGS: The heart size and mediastinal contours are within normal limits. Both lungs are clear. The visualized skeletal structures are unremarkable. IMPRESSION: Negative. Electronically  Signed   By: Charlett Nose M.D.   On: 08/18/2022 01:53        Scheduled Meds:  azithromycin  500 mg Oral Daily   bictegravir-emtricitabine-tenofovir AF  1 tablet Oral Daily   enoxaparin (LOVENOX) injection  40 mg Subcutaneous Q24H   FLUoxetine  10 mg Oral Daily   sodium chloride flush  3 mL Intravenous Q12H   valACYclovir  500 mg Oral BID   Continuous Infusions:  sodium chloride     cefTRIAXone (ROCEPHIN)  IV Stopped (08/18/22 2235)   sulfamethoxazole-trimethoprim 529.28 mg of trimethoprim (08/19/22 0532)          Glade Lloyd, MD [facility] 08/19/2022, 12:49 PM '

## 2022-08-19 NOTE — Progress Notes (Signed)
Pharmacy Antibiotic Note  Pedro Owens is a 31 y.o. adult admitted on 08/18/2022 with pneumonia.  Pharmacy has been consulted for vanc dosing.  Pt with hx of HIV/hep C who was admitted for CAP. She was started on azith/ceftriaxone/septra. ID has added vanc empirically to cover for potential MRSA pending PCR. Septra has been dced due to normal CD4.  Scr 1.13  Plan: Vanc 1.25g IV q12>>AUC 494, scr 1.13 F/u MRSA PCR  Height: 5\' 11"  (180.3 cm) Weight: 79.4 kg (175 lb) IBW/kg (Calculated) : 75.3  Temp (24hrs), Avg:99.1 F (37.3 C), Min:97.6 F (36.4 C), Max:102.7 F (39.3 C)  Recent Labs  Lab 08/18/22 0110 08/18/22 0314  WBC 21.1*  --   CREATININE 1.13  --   LATICACIDVEN  --  0.8    Estimated Creatinine Clearance (by C-G formula based on SCr of 1.13 mg/dL) Male: 81.4 mL/min Male: 101.8 mL/min    Allergies  Allergen Reactions   Acetaminophen Diarrhea and Nausea And Vomiting    Flares up crohns disease Flares up crohns disease Flares up crohns disease   Other Other (See Comments)    IV CONTRAST IV CONTRAST   Vicodin [Hydrocodone-Acetaminophen] Nausea And Vomiting   Dilaudid [Hydromorphone Hcl] Hives and Rash    Antimicrobials this admission: Septra IV 10/31>>11/1 Biktarvy as PTA Azithro IV on 10/31 >> PO 500 daily 10/31 >> CTX 10/31 >> Valtrex 500 BID 10/31>> (reported prn PTA) Vanc 11/1>>  Dose adjustments this admission:   Microbiology results: 10/31 blood: ng x 1 day to date 10/31 urine: neg 10/31 resp panel: neg 10/31 COVID and flu: neg 11/1 PCP smear: 11/1: RPR: reactive, titer pending 11/1 Strep Ag: in process 11/1 Legionella Ag: in process 11/1 T pallidum Ab: in process 11/1 HIV-1 RNA quant: in process  Onnie Boer, PharmD, BCIDP, AAHIVP, CPP Infectious Disease Pharmacist 08/19/2022 6:04 PM

## 2022-08-19 NOTE — Progress Notes (Signed)
  Transition of Care [facility]) Screening Note   Patient Details  Name: Pedro Owens Date of Birth: Nov 11, 1990   Transition of Care [facility]) CM/SW Contact:    Tom-Johnson, Renea Ee, RN Phone Number: 08/19/2022, 2:23 PM  Patient is admitted for Multilobar Pneumonia, on IV abx.   Transition of Care Department [facility]) has reviewed patient and no TOC needs or recommendations have been identified at this time. TOC will continue to monitor patient advancement through interdisciplinary progression rounds. If new patient transition needs arise, please place a TOC consult.

## 2022-08-20 LAB — COMPREHENSIVE METABOLIC PANEL
ALT: 36 U/L (ref 0–44)
AST: 28 U/L (ref 15–41)
Albumin: 2.6 g/dL — ABNORMAL LOW (ref 3.5–5.0)
Alkaline Phosphatase: 64 U/L (ref 38–126)
Anion gap: 5 (ref 5–15)
BUN: 14 mg/dL (ref 6–20)
CO2: 23 mmol/L (ref 22–32)
Calcium: 8.3 mg/dL — ABNORMAL LOW (ref 8.9–10.3)
Chloride: 108 mmol/L (ref 98–111)
Creatinine, Ser: 1.22 mg/dL (ref 0.61–1.24)
GFR, Estimated: >60 mL/min (ref >60)
Glucose, Bld: 95 mg/dL (ref 70–99)
Potassium: 3.9 mmol/L (ref 3.5–5.1)
Sodium: 136 mmol/L (ref 135–145)
Total Bilirubin: <0.1 mg/dL — ABNORMAL LOW (ref 0.3–1.2)
Total Protein: 6.5 g/dL (ref 6.5–8.1)

## 2022-08-20 LAB — MAGNESIUM: Magnesium: 2 mg/dL (ref 1.7–2.4)

## 2022-08-20 LAB — CBC WITH DIFFERENTIAL/PLATELET
Abs Immature Granulocytes: 0.05 10*3/uL (ref 0.00–0.07)
Basophils Absolute: 0 10*3/uL (ref 0.0–0.1)
Basophils Relative: 0 %
Eosinophils Absolute: 0.4 10*3/uL (ref 0.0–0.5)
Eosinophils Relative: 4 %
HCT: 41 % (ref 39.0–52.0)
Hemoglobin: 13.9 g/dL (ref 13.0–17.0)
Immature Granulocytes: 1 %
Lymphocytes Relative: 26 %
Lymphs Abs: 2.4 10*3/uL (ref 0.7–4.0)
MCH: 29.3 pg (ref 26.0–34.0)
MCHC: 33.9 g/dL (ref 30.0–36.0)
MCV: 86.5 fL (ref 80.0–100.0)
Monocytes Absolute: 0.9 10*3/uL (ref 0.1–1.0)
Monocytes Relative: 9 %
Neutro Abs: 5.6 10*3/uL (ref 1.7–7.7)
Neutrophils Relative %: 60 %
Platelets: 320 10*3/uL (ref 150–400)
RBC: 4.74 MIL/uL (ref 4.22–5.81)
RDW: 13.4 % (ref 11.5–15.5)
WBC: 9.3 10*3/uL (ref 4.0–10.5)
nRBC: 0 % (ref 0.0–0.2)

## 2022-08-20 LAB — LEGIONELLA PNEUMOPHILA SEROGP 1 UR AG: L. pneumophila Serogp 1 Ur Ag: NEGATIVE

## 2022-08-20 LAB — HIV-1 RNA QUANT-NO REFLEX-BLD
HIV 1 RNA Quant: 110 copies/mL
LOG10 HIV-1 RNA: 2.041 log10copy/mL

## 2022-08-20 LAB — T.PALLIDUM AB, TOTAL: T Pallidum Abs: REACTIVE — AB

## 2022-08-20 MED ORDER — DOXYCYCLINE HYCLATE 100 MG PO TABS
100.0000 mg | ORAL_TABLET | Freq: Two times a day (BID) | ORAL | Status: DC
  Administered 2022-08-20 – 2022-08-21 (×2): 100 mg via ORAL
  Filled 2022-08-20 (×2): qty 1

## 2022-08-20 NOTE — Progress Notes (Signed)
PROGRESS NOTE    Pedro Owens  JQB:[id number] DOB: 1991-06-18 DOA: 08/18/2022 PCP: Diamantina Providence, FNP   Brief Narrative:  31 year old transgender male with history of HIV, medical noncompliance, hepatitis C, bipolar disorder, depression, pneumonia with empyema requiring chest tube presented with cough, fever and chest pain.  On presentation, he was febrile to 101.8 with leukocytosis to 21.1.  CT revealed multilobar pneumonia.  He was started on IV fluids and antibiotics.  ID was subsequently consulted.  Assessment & Plan:   Multifocal pneumonia -ID following: Currently on Rocephin and vancomycin with Zithromax as per ID.  ID has discontinued IV Bactrim because pneumocystis risk is low and the CD4 is more than 200 -COVID-19 and respiratory virus panel PCR negative. -Vancomycin being used since there might be concern for MRSA pneumonia.  HIV Medical noncompliance -Patient apparently was recently restarted on antiretrovirals by ID.  Later CD4 521. -Continue Biktarvy  Depression -Continue sertraline  Leukocytosis -Resolved  Elevated LFTs -Resolved  DVT prophylaxis: Lovenox Code Status: Full Family Communication: None at bedside Disposition Plan: Status is: inpatient because: Of need for IV antibiotics  Consultants: ID  Procedures: None  Antimicrobials: Anti-infectives (From admission, onward)    Start     Dose/Rate Route Frequency Ordered Stop   08/19/22 1900  vancomycin (VANCOREADY) IVPB 1250 mg/250 mL        1,250 mg 166.7 mL/hr over 90 Minutes Intravenous Every 12 hours 08/19/22 1800     08/18/22 2200  sulfamethoxazole-trimethoprim (BACTRIM) 529.28 mg of trimethoprim in dextrose 5 % 500 mL IVPB  Status:  Discontinued        20 mg/kg/day of trimethoprim  79.4 kg 355.4 mL/hr over 90 Minutes Intravenous Every 8 hours 08/18/22 1913 08/19/22 1742   08/18/22 2200  valACYclovir (VALTREX) tablet 500 mg       Note to Pharmacy: Take 1 tablet by mouth 2 times daily      500 mg Oral 2 times daily 08/18/22 1922     08/18/22 2015  bictegravir-emtricitabine-tenofovir AF (BIKTARVY) 50-200-25 MG per tablet 1 tablet        1 tablet Oral Daily 08/18/22 1922     08/18/22 2000  cefTRIAXone (ROCEPHIN) 2 g in sodium chloride 0.9 % 100 mL IVPB        2 g 200 mL/hr over 30 Minutes Intravenous Every 24 hours 08/18/22 1913     08/18/22 2000  azithromycin (ZITHROMAX) tablet 500 mg  Status:  Discontinued        500 mg Oral Daily 08/18/22 1913 08/18/22 1916   08/18/22 2000  azithromycin (ZITHROMAX) tablet 500 mg        500 mg Oral Daily 08/18/22 1913 08/23/22 0959   08/18/22 0415  cefTRIAXone (ROCEPHIN) 1 g in sodium chloride 0.9 % 100 mL IVPB        1 g 200 mL/hr over 30 Minutes Intravenous  Once 08/18/22 0406 08/18/22 0459   08/18/22 0415  azithromycin (ZITHROMAX) 500 mg in sodium chloride 0.9 % 250 mL IVPB        500 mg 250 mL/hr over 60 Minutes Intravenous  Once 08/18/22 0406 08/18/22 0604        Subjective: Patient seen and examined at bedside.  Poor historian, sleepy, wakes up slightly, does not participate in conversation much.  No fever, vomiting or chest pain reported.  Objective: Vitals:   08/19/22 0907 08/19/22 1715 08/19/22 2118 08/20/22 0618  BP: 122/80 130/86 (!) 140/92 108/71  Pulse: (!) 59 65 61 (!)  50  Resp: 16 16 18 17   Temp: 98.3 F (36.8 C) 98.3 F (36.8 C) 98.5 F (36.9 C) 97.7 F (36.5 C)  TempSrc: Oral Oral  Oral  SpO2: 99% 99% 98% 98%  Weight:      Height:        Intake/Output Summary (Last 24 hours) at 08/20/2022 0756 Last data filed at 08/20/2022 0200 Gross per 24 hour  Intake 1576.23 ml  Output 0 ml  Net 1576.23 ml    Filed Weights   08/18/22 0059  Weight: 79.4 kg    Examination:  General: On room air.  No distress.  Extremely sleepy, wakes up slightly, does not participate in conversation much. respiratory: Decreased breath sounds at bases bilaterally with some crackles CVS: Currently has mild intermittent  bradycardia; S1-S2 heard  abdominal: Soft, nontender, slightly distended, no organomegaly; normal bowel sounds are heard  extremities: Trace lower extremity edema; no clubbing.      Data Reviewed: I have personally reviewed following labs and imaging studies  CBC: Recent Labs  Lab 08/18/22 0110 08/20/22 0507  WBC 21.1* 9.3  NEUTROABS  --  5.6  HGB 16.4 13.9  HCT 47.9 41.0  MCV 84.9 86.5  PLT 304 902    Basic Metabolic Panel: Recent Labs  Lab 08/18/22 0110 08/20/22 0507  NA 135 136  K 3.7 3.9  CL 102 108  CO2 24 23  GLUCOSE 124* 95  BUN 16 14  CREATININE 1.13 1.22  CALCIUM 9.2 8.3*  MG  --  2.0    GFR: Estimated Creatinine Clearance (by C-G formula based on SCr of 1.22 mg/dL) Male: 75.4 mL/min Male: 94.3 mL/min Liver Function Tests: Recent Labs  Lab 08/18/22 0327 08/20/22 0507  AST 42* 28  ALT 53* 36  ALKPHOS 70 64  BILITOT 1.0 <0.1*  PROT 8.5* 6.5  ALBUMIN 4.1 2.6*    No results for input(s): "LIPASE", "AMYLASE" in the last 168 hours. No results for input(s): "AMMONIA" in the last 168 hours. Coagulation Profile: No results for input(s): "INR", "PROTIME" in the last 168 hours. Cardiac Enzymes: No results for input(s): "CKTOTAL", "CKMB", "CKMBINDEX", "TROPONINI" in the last 168 hours. BNP (last 3 results) No results for input(s): "PROBNP" in the last 8760 hours. HbA1C: No results for input(s): "HGBA1C" in the last 72 hours. CBG: No results for input(s): "GLUCAP" in the last 168 hours. Lipid Profile: No results for input(s): "CHOL", "HDL", "LDLCALC", "TRIG", "CHOLHDL", "LDLDIRECT" in the last 72 hours. Thyroid Function Tests: No results for input(s): "TSH", "T4TOTAL", "FREET4", "T3FREE", "THYROIDAB" in the last 72 hours. Anemia Panel: No results for input(s): "VITAMINB12", "FOLATE", "FERRITIN", "TIBC", "IRON", "RETICCTPCT" in the last 72 hours. Sepsis Labs: Recent Labs  Lab 08/18/22 0314  LATICACIDVEN 0.8     Recent Results (from the past  240 hour(s))  Resp Panel by RT-PCR (Flu A&B, Covid) Anterior Nasal Swab     Status: None   Collection Time: 08/18/22  1:10 AM   Specimen: Anterior Nasal Swab  Result Value Ref Range Status   SARS Coronavirus 2 by RT PCR NEGATIVE NEGATIVE Final    Comment: (NOTE) SARS-CoV-2 target nucleic acids are NOT DETECTED.  The SARS-CoV-2 RNA is generally detectable in upper respiratory specimens during the acute phase of infection. The lowest concentration of SARS-CoV-2 viral copies this assay can detect is 138 copies/mL. A negative result does not preclude SARS-Cov-2 infection and should not be used as the sole basis for treatment or other patient management decisions. A negative result  may occur with  improper specimen collection/handling, submission of specimen other than nasopharyngeal swab, presence of viral mutation(s) within the areas targeted by this assay, and inadequate number of viral copies(<138 copies/mL). A negative result must be combined with clinical observations, patient history, and epidemiological information. The expected result is Negative.  Fact Sheet for Patients:  BloggerCourse.com  Fact Sheet for Healthcare Providers:  SeriousBroker.it  This test is no t yet approved or cleared by the [place] FDA and  has been authorized for detection and/or diagnosis of SARS-CoV-2 by FDA under an Emergency Use Authorization (EUA). This EUA will remain  in effect (meaning this test can be used) for the duration of the COVID-19 declaration under Section 564(b)(1) of the Act, 21 U.S.C.section 360bbb-3(b)(1), unless the authorization is terminated  or revoked sooner.       Influenza A by PCR NEGATIVE NEGATIVE Final   Influenza B by PCR NEGATIVE NEGATIVE Final    Comment: (NOTE) The Xpert Xpress SARS-CoV-2/FLU/RSV plus assay is intended as an aid in the diagnosis of influenza from Nasopharyngeal swab specimens and should not  be used as a sole basis for treatment. Nasal washings and aspirates are unacceptable for Xpert Xpress SARS-CoV-2/FLU/RSV testing.  Fact Sheet for Patients: BloggerCourse.com  Fact Sheet for Healthcare Providers: SeriousBroker.it  This test is not yet approved or cleared by the [place] FDA and has been authorized for detection and/or diagnosis of SARS-CoV-2 by FDA under an Emergency Use Authorization (EUA). This EUA will remain in effect (meaning this test can be used) for the duration of the COVID-19 declaration under Section 564(b)(1) of the Act, 21 U.S.C. section 360bbb-3(b)(1), unless the authorization is terminated or revoked.  Performed at [facility], [address] [place], [place] [address]   Blood Culture (routine x 2)     Status: None (Preliminary result)   Collection Time: 08/18/22  3:27 AM   Specimen: BLOOD  Result Value Ref Range Status   Specimen Description   Final    BLOOD LEFT ARM Performed at [facility], [address] [place], [place] [address]    Special Requests   Final    BOTTLES DRAWN AEROBIC AND ANAEROBIC Blood Culture adequate volume Performed at [facility], [address] [place], [place] [address]    Culture   Final    NO GROWTH 2 DAYS Performed at [facility], [address], [place], [place] [address]    Report Status PENDING  Incomplete  Blood Culture (routine x 2)     Status: None (Preliminary result)   Collection Time: 08/18/22  3:48 AM   Specimen: BLOOD  Result Value Ref Range Status   Specimen Description   Final    BLOOD BLOOD LEFT FOREARM Performed at [facility], [address], [place], [place] [address]    Special Requests   Final    BOTTLES DRAWN AEROBIC ONLY Blood Culture results may not be optimal due to an inadequate volume of blood received in culture bottles Performed at [facility], [address] [place], [place] [address]    Culture   Final    NO GROWTH 2 DAYS Performed at [facility], [address] [place], [place] [address]    Report Status PENDING  Incomplete  Urine Culture     Status: None   Collection Time: 08/18/22  5:32 AM   Specimen: In/Out Cath Urine  Result Value Ref Range Status   Specimen Description  Final    IN/OUT CATH URINE Performed at [facility], [address] [place], [place] [address]    Special Requests   Final    NONE Performed at [facility], [address] [place], [place] [address]    Culture   Final    NO GROWTH Performed at [facility], [address] [place], [place] [address]    Report Status 08/19/2022 FINAL  Final  Respiratory (~20 pathogens) panel by PCR     Status: None   Collection Time: 08/18/22  7:06 PM   Specimen: Sputum; Respiratory  Result Value Ref Range Status   Adenovirus NOT DETECTED NOT DETECTED Final   Coronavirus 229E NOT DETECTED NOT DETECTED Final    Comment: (NOTE) The Coronavirus on the Respiratory Panel, DOES NOT test for the novel  Coronavirus (2019 nCoV)    Coronavirus HKU1 NOT DETECTED NOT DETECTED Final   Coronavirus NL63 NOT DETECTED NOT DETECTED Final   Coronavirus OC43 NOT DETECTED NOT DETECTED Final   Metapneumovirus NOT DETECTED NOT DETECTED Final   Rhinovirus / Enterovirus NOT DETECTED NOT DETECTED Final   Influenza A NOT DETECTED NOT DETECTED Final   Influenza B NOT DETECTED NOT DETECTED Final   Parainfluenza Virus 1 NOT DETECTED NOT DETECTED Final   Parainfluenza Virus 2 NOT DETECTED NOT DETECTED Final   Parainfluenza Virus 3 NOT DETECTED NOT DETECTED Final   Parainfluenza Virus 4 NOT DETECTED NOT DETECTED Final   Respiratory Syncytial Virus NOT DETECTED NOT DETECTED Final   Bordetella pertussis NOT DETECTED NOT DETECTED Final   Bordetella Parapertussis NOT DETECTED NOT DETECTED Final    Chlamydophila pneumoniae NOT DETECTED NOT DETECTED Final   Mycoplasma pneumoniae NOT DETECTED NOT DETECTED Final    Comment: Performed at [facility], [address] [place], [place] [address]  MRSA Next Gen by PCR, Nasal     Status: Abnormal   Collection Time: 08/19/22  6:30 PM   Specimen: Nasal Mucosa; Nasal Swab  Result Value Ref Range Status   MRSA by PCR Next Gen DETECTED (A) NOT DETECTED Final    Comment: RESULT CALLED TO, READ BACK BY AND VERIFIED WITH: RN K. GENGLER [id number] @2154  FH (NOTE) The GeneXpert MRSA Assay (FDA approved for NASAL specimens only), is one component of a comprehensive MRSA colonization surveillance program. It is not intended to diagnose MRSA infection nor to guide or monitor treatment for MRSA infections. Test performance is not FDA approved in patients less than 72 years old. Performed at [facility], [address] [place], [place]          Radiology Studies: No results found.      Scheduled Meds:  azithromycin  500 mg Oral Daily   bictegravir-emtricitabine-tenofovir AF  1 tablet Oral Daily   Chlorhexidine Gluconate Cloth  6 each Topical Q0600   enoxaparin (LOVENOX) injection  40 mg Subcutaneous Q24H   FLUoxetine  10 mg Oral Daily   mupirocin ointment  1 Application Nasal BID   sodium chloride flush  3 mL Intravenous Q12H   valACYclovir  500 mg Oral BID   Continuous Infusions:  sodium chloride     cefTRIAXone (ROCEPHIN)  IV 2 g (08/19/22 2140)   vancomycin 1,250 mg (08/20/22 13/02/23)          [id number], MD [facility] 08/20/2022, 7:56 AM '

## 2022-08-20 NOTE — Progress Notes (Signed)
    [facility] for Infectious Disease    Date of Admission:  08/18/2022   Total days of antibiotics 4           ID: Pedro Owens is a 31 y.o. adult with  multifocal pneumonia in hiv patient Principal Problem:   Multifocal pneumonia Active Problems:   Depression   HIV disease ([place])    Subjective: Has headache this morning but otherwise slept okay. Less cough. No fever  Medications:   bictegravir-emtricitabine-tenofovir AF  1 tablet Oral Daily   Chlorhexidine Gluconate Cloth  6 each Topical Q0600   doxycycline  100 mg Oral Q12H   enoxaparin (LOVENOX) injection  40 mg Subcutaneous Q24H   FLUoxetine  10 mg Oral Daily   mupirocin ointment  1 Application Nasal BID   sodium chloride flush  3 mL Intravenous Q12H   valACYclovir  500 mg Oral BID    Objective: Vital signs in last 24 hours: Temp:  [97.5 F (36.4 C)-98.5 F (36.9 C)] 97.5 F (36.4 C) (11/02 0810) Pulse Rate:  [50-65] 57 (11/02 0810) Resp:  [16-18] 17 (11/02 0618) BP: (108-140)/(71-92) 120/73 (11/02 0810) SpO2:  [98 %-99 %] 98 % (11/02 0810)  Physical Exam  Constitutional:  oriented to person, place, and time. appears well-developed and well-nourished. No distress.  HENT: [place]/AT, PERRLA, no scleral icterus Mouth/Throat: Oropharynx is clear and moist. No oropharyngeal exudate.  Cardiovascular: Normal rate, regular rhythm and normal heart sounds. Exam reveals no gallop and no friction rub.  No murmur heard.  Pulmonary/Chest: Effort normal and breath sounds normal. No respiratory distress.  has no wheezes.  Neck = supple, no nuchal rigidity Abdominal: Soft. Bowel sounds are normal.  exhibits no distension. There is no tenderness.  Lymphadenopathy: no cervical adenopathy. No axillary adenopathy Neurological: alert and oriented to person, place, and time.  Skin: Skin is warm and dry. No rash noted. No erythema.  Psychiatric: a normal mood and affect.  behavior is normal.    Lab Results Recent Labs     08/18/22 0110 08/20/22 0507  WBC 21.1* 9.3  HGB 16.4 13.9  HCT 47.9 41.0  NA 135 136  K 3.7 3.9  CL 102 108  CO2 24 23  BUN 16 14  CREATININE 1.13 1.22   Liver Panel Recent Labs    08/18/22 0327 08/20/22 0507  PROT 8.5* 6.5  ALBUMIN 4.1 2.6*  AST 42* 28  ALT 53* 36  ALKPHOS 70 64  BILITOT 1.0 <0.1*  BILIDIR 0.3*  --   IBILI 0.7  --    Sedimentation Rate No results for input(s): "ESRSEDRATE" in the last 72 hours. C-Reactive Protein No results for input(s): "CRP" in the last 72 hours.  Microbiology: reviewed Studies/Results: No results found.   Assessment/Plan: CAP = continue on ceftriaxone and will change vancomycin to doxycycline. Follow up on studies  HIV disease= continue on biktarvy. Following up on labs  Hx of syphilis= does not need treatment  Headache = treat with prn tylenol or ibuprofen  [facility] Pager: [phone]  08/20/2022, 5:04 PM

## 2022-08-21 ENCOUNTER — Other Ambulatory Visit (HOSPITAL_COMMUNITY): Payer: Self-pay

## 2022-08-21 ENCOUNTER — Telehealth (HOSPITAL_COMMUNITY): Payer: Self-pay | Admitting: Pharmacy Technician

## 2022-08-21 MED ORDER — AMOXICILLIN-POT CLAVULANATE 875-125 MG PO TABS
1.0000 | ORAL_TABLET | Freq: Two times a day (BID) | ORAL | 0 refills | Status: AC
  Filled 2022-08-21: qty 2, 1d supply, fill #0

## 2022-08-21 MED ORDER — DOXYCYCLINE HYCLATE 100 MG PO TABS: 100.0000 mg | ORAL_TABLET | Freq: Two times a day (BID) | ORAL | 0 refills | Status: DC

## 2022-08-21 MED ORDER — DEXTROMETHORPHAN-GUAIFENESIN 10-200 MG/5ML PO LIQD: 10.0000 mL | Freq: Four times a day (QID) | ORAL | Status: AC | PRN

## 2022-08-21 MED ORDER — AMOXICILLIN-POT CLAVULANATE 875-125 MG PO TABS: 1.0000 | ORAL_TABLET | Freq: Two times a day (BID) | ORAL | 0 refills | Status: DC

## 2022-08-21 MED ORDER — DOXYCYCLINE HYCLATE 100 MG PO TABS
100.0000 mg | ORAL_TABLET | Freq: Two times a day (BID) | ORAL | 0 refills | Status: AC
  Filled 2022-08-21: qty 2, 1d supply, fill #0

## 2022-08-21 MED ORDER — DEXTROMETHORPHAN-GUAIFENESIN 10-200 MG/5ML PO LIQD: 10.0000 mL | Freq: Four times a day (QID) | ORAL | Status: DC | PRN

## 2022-08-21 MED ORDER — BIKTARVY 50-200-25 MG PO TABS
1.0000 | ORAL_TABLET | Freq: Every day | ORAL | 0 refills | Status: DC
  Filled 2022-08-21: qty 30, 30d supply, fill #0

## 2022-08-21 NOTE — Plan of Care (Signed)
  Problem: Respiratory: Goal: Ability to maintain a clear airway will improve Outcome: Progressing   Problem: Respiratory: Goal: Ability to maintain adequate ventilation will improve Outcome: Progressing   Problem: Education: Goal: Knowledge of General Education information will improve Description: Including pain rating scale, medication(s)/side effects and non-pharmacologic comfort measures Outcome: Progressing   Problem: Clinical Measurements: Goal: Respiratory complications will improve Outcome: Progressing   Problem: Activity: Goal: Risk for activity intolerance will decrease Outcome: Progressing   Problem: Coping: Goal: Level of anxiety will decrease Outcome: Progressing   Problem: Safety: Goal: Ability to remain free from injury will improve Outcome: Progressing   Problem: Pain Managment: Goal: General experience of comfort will improve Outcome: Progressing

## 2022-08-21 NOTE — Progress Notes (Signed)
ID PROGRESS NOTE   Afebrile, leukocytosis resolved  A/P: CAP = will finish out course with 1 day of doxycycline HIV disease= gave 30d supply of biktarvy, and also have her come to ID clinic to redo paperwork for [employer] coverage. She has upcoming appt with dr Megan Salon on 11/14. 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 [facility] [phone]

## 2022-08-21 NOTE — Plan of Care (Signed)

## 2022-08-21 NOTE — Progress Notes (Signed)
DISCHARGE NOTE HOME Pedro Owens to be discharged Home per MD order. Discussed prescriptions and follow up appointments with the patient. Prescriptions given to patient; medication list explained in detail. Patient verbalized understanding.  Skin clean, dry and intact without evidence of skin break down, no evidence of skin tears noted. IV catheter discontinued intact. Site without signs and symptoms of complications. Dressing and pressure applied. Pt denies pain at the site currently. No complaints noted.  An After Visit Summary (AVS) was printed and given to the patient. Patient escorted via wheelchair, and discharged home via private auto.  Virgina Jock, RN

## 2022-08-21 NOTE — Telephone Encounter (Signed)
Patient Advocate Encounter  Completed and sent [employer] Access application for [place] for this patient who is uninsured.      BIN      [id number] PCN    [id number] [id number]    [id number] ID        [id number]     Lyndel Safe, [place] Patient Advocate Specialist [facility] Patient Advocate Team Direct Number: [phone] Fax: [phone]

## 2022-08-21 NOTE — TOC Transition Note (Signed)
Transition of Care [facility]) - CM/SW Discharge Note   Patient Details  Name: Pedro Owens MRN: [id number] Date of Birth: Mar 06, 1991  [facility] CM/SW Contact:  Tom-Johnson, Renea Ee, RN Phone Number: 08/21/2022, 11:27 AM   Clinical Narrative:     Patient is scheduled for discharge today. MATCH done for Prescription assistance. Prescriptions sent to [facility] and meds will be delivered to patient at bedside prior to discharge. Friend to transport at discharge. No further TOC needs noted.    Final next level of care: Home/Self Care Barriers to Discharge: Barriers Resolved   Patient Goals and CMS Choice Patient states their goals for this hospitalization and ongoing recovery are:: To return home CMS Medicare.gov Compare Post Acute Care list provided to:: Patient Choice offered to / list presented to : Patient  Discharge Placement                Patient to be transferred to facility by: Friend      Discharge Plan and Services   Discharge Planning Services: [place] [facility]            DME Arranged: N/A DME Agency: NA       HH Arranged: NA HH Agency: NA        Social Determinants of Health (SDOH) Interventions     Readmission Risk Interventions     No data to display

## 2022-08-21 NOTE — Discharge Summary (Signed)
Physician Discharge Summary  Pedro Owens KXF:[id number] DOB: [birth year] DOA: 08/18/2022  PCP: Vonna Drafts, FNP  Admit date: 08/18/2022 Discharge date: 08/21/2022  Admitted From: Home Disposition: Home  Recommendations for Outpatient Follow-up:  Follow up with PCP in 1 week Outpatient follow-up with ID Comply with medications and follow-up Follow up in ED if symptoms worsen or new appear   Home Health: No Equipment/Devices: None  Discharge Condition: Stable CODE STATUS: Full Diet recommendation: Heart healthy  Brief/Interim Summary: 31 year old transgender male with history of HIV, medical noncompliance, hepatitis C, bipolar disorder, depression, pneumonia with empyema requiring chest tube presented with cough, fever and chest pain.  On presentation, he was febrile to 101.8 with leukocytosis to 21.1.  CT revealed multilobar pneumonia.  He was started on IV fluids and antibiotics.  ID was subsequently consulted.  Subsequently, his condition has improved.  His antibiotics have been switched to IV Rocephin and oral doxycycline by ID.  Patient feels better and is currently on room air.  ID has cleared the patient for discharge on oral Augmentin and doxycycline.  Discharge patient home today.  Discharge Diagnoses:     Multifocal pneumonia -ID following: Currently on Rocephin and vancomycin with Zithromax as per ID.  ID has discontinued IV Bactrim because pneumocystis risk is low and the CD4 is more than 200 -COVID-19 and respiratory virus panel PCR negative. -Subsequently, his condition has improved.  His antibiotics have been switched to IV Rocephin and oral doxycycline by ID.  Patient feels better and is currently on room air.  ID has cleared the patient for discharge on oral Augmentin and doxycycline.  Discharge patient home today.  Outpatient follow-up with ID.    HIV Medical noncompliance -Patient apparently was recently restarted on antiretrovirals by ID.  Later CD4  [place].  Outpatient follow-up with ID.   Depression -Continue Prozac  Leukocytosis -Resolved   Elevated LFTs -Resolved   Discharge Instructions  Discharge Instructions     Ambulatory referral to Infectious Disease   Complete by: As directed    Diet general   Complete by: As directed    Increase activity slowly   Complete by: As directed       Allergies as of 08/21/2022       Reactions   Acetaminophen Diarrhea, Nausea And Vomiting   Flares up crohns disease Flares up crohns disease Flares up crohns disease   Other Other (See Comments)   IV CONTRAST IV CONTRAST   Vicodin [hydrocodone-acetaminophen] Nausea And Vomiting   Dilaudid [hydromorphone Hcl] Hives, Rash        Medication List     STOP taking these medications    OLANZapine 10 MG tablet Commonly known as: ZYPREXA   ondansetron 4 MG disintegrating tablet Commonly known as: Zofran ODT       TAKE these medications    acetaminophen 325 MG tablet Commonly known as: TYLENOL Take 650 mg by mouth every 6 (six) hours as needed.   amoxicillin-clavulanate 875-125 MG tablet Commonly known as: AUGMENTIN Take 1 tablet by mouth 2 (two) times daily for 5 days.   Biktarvy 50-200-25 MG Tabs tablet Generic drug: bictegravir-emtricitabine-tenofovir AF Take 1 tablet by mouth daily.   Dextromethorphan-guaiFENesin 10-200 MG/5ML Liqd Take 10 mLs by mouth every 6 (six) hours as needed for up to 7 days.   doxycycline 100 MG tablet Commonly known as: VIBRA-TABS Take 1 tablet (100 mg total) by mouth every 12 (twelve) hours for 5 days.   FLUoxetine 10 MG capsule  Commonly known as: PROZAC Take 1 capsule (10 mg total) by mouth daily.   omega-3 acid ethyl esters 1 g capsule Commonly known as: LOVAZA Take 1 capsule (1 g total) by mouth 2 (two) times daily.   valACYclovir 500 MG tablet Commonly known as: Valtrex Take 1 tablet by mouth 2 times daily        Follow-up Information      Vonna Drafts, FNP. Schedule an appointment as soon as possible for a visit in 2 day(s).   Specialty: Nurse Practitioner Contact information: [address] [place] [address] [phone]                Allergies  Allergen Reactions   Acetaminophen Diarrhea and Nausea And Vomiting    Flares up crohns disease Flares up crohns disease Flares up crohns disease   Other Other (See Comments)    IV CONTRAST IV CONTRAST   Vicodin [Hydrocodone-Acetaminophen] Nausea And Vomiting   Dilaudid [Hydromorphone Hcl] Hives and Rash    Consultations: ID   Procedures/Studies: CT Chest Wo Contrast  Result Date: 08/18/2022 CLINICAL DATA:  Chest pain with respiratory illness. Negative PA Lat chest today. Flu-like symptoms since yesterday morning. Also subjective fever, chills, and body aches with productive cough. EXAM: CT CHEST WITHOUT CONTRAST TECHNIQUE: Multidetector CT imaging of the chest was performed following the standard protocol without IV contrast. RADIATION DOSE REDUCTION: This exam was performed according to the departmental dose-optimization program which includes automated exposure control, adjustment of the mA and/or kV according to patient size and/or use of iterative reconstruction technique. COMPARISON:  Chest CT with IV contrast 11/23/2017, PA and lateral chest radiographs today, chest radiograph 11/30/2017. FINDINGS: Cardiovascular: The heart is slightly enlarged. There is no pericardial effusion and no visible coronary artery calcifications. The aorta and great vessels are unremarkable without contrast. The pulmonary arteries and veins are normal in caliber. Mediastinum/Nodes: There are borderline prominent AP window and right paratracheal lymph nodes up to 8 mm short axis. Assessment for hilar adenopathy is limited without IV contrast but no obvious hilar mass is evident or further mediastinal adenopathy. No thyroid or axillary mass is seen. Thoracic  esophagus thoracic trachea are unremarkable. Lungs/Pleura: Mild elevation right hemidiaphragm. There is no pleural effusion, thickening or pneumothorax. There is bronchial thickening in the lower lobes, scattered small basilar bronchiolar impactions. There is patchy consolidation in the lower lung fields, including in the lingular base, right middle lobe base, left lower lobe medial basal segment right lower lobe lateral basal segment, findings consistent with multilobar pneumonia. Both upper lobes are generally clear except for the lingular consolidation. There is minimal paraseptal emphysema in the lung apices. Upper Abdomen: No acute abnormality. Musculoskeletal: No chest wall mass or suspicious bone lesions identified. IMPRESSION: 1. Multilobar pneumonia in the lower lung fields, with bronchial thickening and scattered small basilar bronchiolar impactions. Follow-up CT recommended after treatment to ensure clearing. 2. Borderline prominent mediastinal lymph nodes. 3. Minimal paraseptal emphysema in the lung apices. 4. Mild cardiomegaly. Emphysema (ICD10-J43.9). Electronically Signed   By: Telford Nab M.D.   On: 08/18/2022 03:58   DG Chest 2 View  Result Date: 08/18/2022 CLINICAL DATA:  Chest pain EXAM: CHEST - 2 VIEW COMPARISON:  11/30/2017 FINDINGS: The heart size and mediastinal contours are within normal limits. Both lungs are clear. The visualized skeletal structures are unremarkable. IMPRESSION: Negative. Electronically Signed   By: Rolm Baptise M.D.   On: 08/18/2022 01:53      Subjective: Patient  seen and examined at bedside.  Feels better.  Feels okay to go home today.  No overnight fever, nausea, vomiting reported.  Discharge Exam: Vitals:   08/21/22 0504 08/21/22 0859  BP: 128/74 136/78  Pulse: (!) 48 (!) 57  Resp: 18   Temp: 97.8 F (36.6 C) 98 F (36.7 C)  SpO2: 99% 99%    General: Pt is alert, awake, not in acute distress.  Currently on room air. Cardiovascular: Mild  intermittent bradycardia; S1/S2 + Respiratory: bilateral decreased breath sounds at bases with some scattered crackles Abdominal: Soft, NT, ND, bowel sounds + Extremities: no edema, no cyanosis    The results of significant diagnostics from this hospitalization (including imaging, microbiology, ancillary and laboratory) are listed below for reference.     Microbiology: Recent Results (from the past 240 hour(s))  Resp Panel by RT-PCR (Flu A&B, Covid) Anterior Nasal Swab     Status: None   Collection Time: 08/18/22  1:10 AM   Specimen: Anterior Nasal Swab  Result Value Ref Range Status   SARS Coronavirus 2 by RT PCR NEGATIVE NEGATIVE Final    Comment: (NOTE) SARS-CoV-2 target nucleic acids are NOT DETECTED.  The SARS-CoV-2 RNA is generally detectable in upper respiratory specimens during the acute phase of infection. The lowest concentration of SARS-CoV-2 viral copies this assay can detect is 138 copies/mL. A negative result does not preclude SARS-Cov-2 infection and should not be used as the sole basis for treatment or other patient management decisions. A negative result may occur with  improper specimen collection/handling, submission of specimen other than nasopharyngeal swab, presence of viral mutation(s) within the areas targeted by this assay, and inadequate number of viral copies(<138 copies/mL). A negative result must be combined with clinical observations, patient history, and epidemiological information. The expected result is Negative.  Fact Sheet for Patients:  BloggerCourse.com  Fact Sheet for Healthcare Providers:  SeriousBroker.it  This test is no t yet approved or cleared by the [place] FDA and  has been authorized for detection and/or diagnosis of SARS-CoV-2 by FDA under an Emergency Use Authorization (EUA). This EUA will remain  in effect (meaning this test can be used) for the duration of the COVID-19  declaration under Section 564(b)(1) of the Act, 21 U.S.C.section 360bbb-3(b)(1), unless the authorization is terminated  or revoked sooner.       Influenza A by PCR NEGATIVE NEGATIVE Final   Influenza B by PCR NEGATIVE NEGATIVE Final    Comment: (NOTE) The Xpert Xpress SARS-CoV-2/FLU/RSV plus assay is intended as an aid in the diagnosis of influenza from Nasopharyngeal swab specimens and should not be used as a sole basis for treatment. Nasal washings and aspirates are unacceptable for Xpert Xpress SARS-CoV-2/FLU/RSV testing.  Fact Sheet for Patients: BloggerCourse.com  Fact Sheet for Healthcare Providers: SeriousBroker.it  This test is not yet approved or cleared by the [place] FDA and has been authorized for detection and/or diagnosis of SARS-CoV-2 by FDA under an Emergency Use Authorization (EUA). This EUA will remain in effect (meaning this test can be used) for the duration of the COVID-19 declaration under Section 564(b)(1) of the Act, 21 U.S.C. section 360bbb-3(b)(1), unless the authorization is terminated or revoked.  Performed at [facility], [address] [place], [place] [address]   Blood Culture (routine x 2)     Status: None (Preliminary result)   Collection Time: 08/18/22  3:27 AM   Specimen: BLOOD  Result Value Ref Range Status   Specimen Description  Final    BLOOD LEFT ARM Performed at [facility], [address] [place], [place] [address]    Special Requests   Final    BOTTLES DRAWN AEROBIC AND ANAEROBIC Blood Culture adequate volume Performed at [facility], [address], [place], [place] [address]    Culture   Final    NO GROWTH 3 DAYS Performed at [facility], [place] [address], [place], [place] [address]    Report Status PENDING  Incomplete  Blood Culture (routine x 2)     Status: None (Preliminary result)   Collection  Time: 08/18/22  3:48 AM   Specimen: BLOOD  Result Value Ref Range Status   Specimen Description   Final    BLOOD BLOOD LEFT FOREARM Performed at [facility], [address], [place], [place] [address]    Special Requests   Final    BOTTLES DRAWN AEROBIC ONLY Blood Culture results may not be optimal due to an inadequate volume of blood received in culture bottles Performed at [facility], [address] [place], [place] [address]    Culture   Final    NO GROWTH 3 DAYS Performed at [facility], [place] [address], [place], [place] [address]    Report Status PENDING  Incomplete  Urine Culture     Status: None   Collection Time: 08/18/22  5:32 AM   Specimen: In/Out Cath Urine  Result Value Ref Range Status   Specimen Description   Final    IN/OUT CATH URINE Performed at [facility], [address] [place], [place] [address]    Special Requests   Final    NONE Performed at [facility], [address] [place], [place] [address]    Culture   Final    NO GROWTH Performed at [facility], [place] [address], [place], [place] [address]    Report Status 08/19/2022 FINAL  Final  Respiratory (~20 pathogens) panel by PCR     Status: None   Collection Time: 08/18/22  7:06 PM   Specimen: Sputum; Respiratory  Result Value Ref Range Status   Adenovirus NOT DETECTED NOT DETECTED Final   Coronavirus 229E NOT DETECTED NOT DETECTED Final    Comment: (NOTE) The Coronavirus on the Respiratory Panel, DOES NOT test for the novel  Coronavirus (2019 nCoV)    Coronavirus HKU1 NOT DETECTED NOT DETECTED Final   Coronavirus NL63 NOT DETECTED NOT DETECTED Final   Coronavirus OC43 NOT DETECTED NOT DETECTED Final   Metapneumovirus NOT DETECTED NOT DETECTED Final   Rhinovirus / Enterovirus NOT DETECTED NOT DETECTED Final   Influenza A NOT DETECTED NOT DETECTED Final   Influenza B NOT DETECTED NOT DETECTED Final    Parainfluenza Virus 1 NOT DETECTED NOT DETECTED Final   Parainfluenza Virus 2 NOT DETECTED NOT DETECTED Final   Parainfluenza Virus 3 NOT DETECTED NOT DETECTED Final   Parainfluenza Virus 4 NOT DETECTED NOT DETECTED Final   Respiratory Syncytial Virus NOT DETECTED NOT DETECTED Final   Bordetella pertussis NOT DETECTED NOT DETECTED Final   Bordetella Parapertussis NOT DETECTED NOT DETECTED Final   Chlamydophila pneumoniae NOT DETECTED NOT DETECTED Final   Mycoplasma pneumoniae NOT DETECTED NOT DETECTED Final    Comment: Performed at [facility], [place]. [address] [place], [place] [address]  MRSA Next Gen by PCR, Nasal     Status: Abnormal   Collection Time: 08/19/22  6:30 PM   Specimen: Nasal  Mucosa; Nasal Swab  Result Value Ref Range Status   MRSA by PCR Next Gen DETECTED (A) NOT DETECTED Final    Comment: RESULT CALLED TO, READ BACK BY AND VERIFIED WITH: RN K. GENGLER [id number] @[id number]  FH (NOTE) The GeneXpert MRSA Assay (FDA approved for NASAL specimens only), is one component of a comprehensive MRSA colonization surveillance program. It is not intended to diagnose MRSA infection nor to guide or monitor treatment for MRSA infections. Test performance is not FDA approved in patients less than 67 years old. Performed at [facility], [place] [address] [place], [place] [address]      Labs: BNP (last 3 results) No results for input(s): "BNP" in the last 8760 hours. Basic Metabolic Panel: Recent Labs  Lab 08/18/22 0110 08/20/22 0507  NA 135 136  K 3.7 3.9  CL 102 108  CO2 24 23  GLUCOSE 124* 95  BUN 16 14  CREATININE 1.13 1.22  CALCIUM 9.2 8.3*  MG  --  2.0   Liver Function Tests: Recent Labs  Lab 08/18/22 0327 08/20/22 0507  AST 42* 28  ALT 53* 36  ALKPHOS 70 64  BILITOT 1.0 <0.1*  PROT 8.5* 6.5  ALBUMIN 4.1 2.6*   No results for input(s): "LIPASE", "AMYLASE" in the last 168 hours. No results for input(s): "AMMONIA" in the last 168 hours. CBC: Recent  Labs  Lab 08/18/22 0110 08/20/22 0507  WBC 21.1* 9.3  NEUTROABS  --  5.6  HGB 16.4 13.9  HCT 47.9 41.0  MCV 84.9 86.5  PLT 304 320   Cardiac Enzymes: No results for input(s): "CKTOTAL", "CKMB", "CKMBINDEX", "TROPONINI" in the last 168 hours. BNP: Invalid input(s): "POCBNP" CBG: No results for input(s): "GLUCAP" in the last 168 hours. D-Dimer No results for input(s): "DDIMER" in the last 72 hours. Hgb A1c No results for input(s): "HGBA1C" in the last 72 hours. Lipid Profile No results for input(s): "CHOL", "HDL", "LDLCALC", "TRIG", "CHOLHDL", "LDLDIRECT" in the last 72 hours. Thyroid function studies No results for input(s): "TSH", "T4TOTAL", "T3FREE", "THYROIDAB" in the last 72 hours.  Invalid input(s): "FREET3" Anemia work up No results for input(s): "VITAMINB12", "FOLATE", "FERRITIN", "TIBC", "IRON", "RETICCTPCT" in the last 72 hours. Urinalysis    Component Value Date/Time   COLORURINE YELLOW 08/18/2022 0532   APPEARANCEUR CLEAR 08/18/2022 0532   LABSPEC 1.018 08/18/2022 0532   PHURINE 7.0 08/18/2022 0532   GLUCOSEU NEGATIVE 08/18/2022 0532   HGBUR NEGATIVE 08/18/2022 0532   BILIRUBINUR NEGATIVE 08/18/2022 0532   KETONESUR NEGATIVE 08/18/2022 0532   PROTEINUR NEGATIVE 08/18/2022 0532   UROBILINOGEN 1.0 08/04/2015 1935   NITRITE NEGATIVE 08/18/2022 0532   LEUKOCYTESUR TRACE (A) 08/18/2022 0532   Sepsis Labs Recent Labs  Lab 08/18/22 0110 08/20/22 0507  WBC 21.1* 9.3   Microbiology Recent Results (from the past 240 hour(s))  Resp Panel by RT-PCR (Flu A&B, Covid) Anterior Nasal Swab     Status: None   Collection Time: 08/18/22  1:10 AM   Specimen: Anterior Nasal Swab  Result Value Ref Range Status   SARS Coronavirus 2 by RT PCR NEGATIVE NEGATIVE Final    Comment: (NOTE) SARS-CoV-2 target nucleic acids are NOT DETECTED.  The SARS-CoV-2 RNA is generally detectable in upper respiratory specimens during the acute phase of infection. The lowest concentration  of SARS-CoV-2 viral copies this assay can detect is 138 copies/mL. A negative result does not preclude SARS-Cov-2 infection and should not be used as the sole basis for treatment or other patient management  decisions. A negative result may occur with  improper specimen collection/handling, submission of specimen other than nasopharyngeal swab, presence of viral mutation(s) within the areas targeted by this assay, and inadequate number of viral copies(<138 copies/mL). A negative result must be combined with clinical observations, patient history, and epidemiological information. The expected result is Negative.  Fact Sheet for Patients:  EntrepreneurPulse.com.au  Fact Sheet for Healthcare Providers:  IncredibleEmployment.be  This test is no t yet approved or cleared by the [place] FDA and  has been authorized for detection and/or diagnosis of SARS-CoV-2 by FDA under an Emergency Use Authorization (EUA). This EUA will remain  in effect (meaning this test can be used) for the duration of the COVID-19 declaration under Section 564(b)(1) of the Act, 21 U.S.C.section 360bbb-3(b)(1), unless the authorization is terminated  or revoked sooner.       Influenza A by PCR NEGATIVE NEGATIVE Final   Influenza B by PCR NEGATIVE NEGATIVE Final    Comment: (NOTE) The Xpert Xpress SARS-CoV-2/FLU/RSV plus assay is intended as an aid in the diagnosis of influenza from Nasopharyngeal swab specimens and should not be used as a sole basis for treatment. Nasal washings and aspirates are unacceptable for Xpert Xpress SARS-CoV-2/FLU/RSV testing.  Fact Sheet for Patients: EntrepreneurPulse.com.au  Fact Sheet for Healthcare Providers: IncredibleEmployment.be  This test is not yet approved or cleared by the [place] FDA and has been authorized for detection and/or diagnosis of SARS-CoV-2 by FDA under an Emergency Use  Authorization (EUA). This EUA will remain in effect (meaning this test can be used) for the duration of the COVID-19 declaration under Section 564(b)(1) of the Act, 21 U.S.C. section 360bbb-3(b)(1), unless the authorization is terminated or revoked.  Performed at [facility], [address] [place], [place] [address]   Blood Culture (routine x 2)     Status: None (Preliminary result)   Collection Time: 08/18/22  3:27 AM   Specimen: BLOOD  Result Value Ref Range Status   Specimen Description   Final    BLOOD LEFT ARM Performed at [facility], [address] [place], [place] [address]    Special Requests   Final    BOTTLES DRAWN AEROBIC AND ANAEROBIC Blood Culture adequate volume Performed at [facility], [address] [place], [place] [address]    Culture   Final    NO GROWTH 3 DAYS Performed at [facility], [place] [address], [place], [place] [address]    Report Status PENDING  Incomplete  Blood Culture (routine x 2)     Status: None (Preliminary result)   Collection Time: 08/18/22  3:48 AM   Specimen: BLOOD  Result Value Ref Range Status   Specimen Description   Final    BLOOD BLOOD LEFT FOREARM Performed at [facility], [address] [place], [place] [address]    Special Requests   Final    BOTTLES DRAWN AEROBIC ONLY Blood Culture results may not be optimal due to an inadequate volume of blood received in culture bottles Performed at [facility], [address], [place], [place] [address]    Culture   Final    NO GROWTH 3 DAYS Performed at [facility], [place] [address] [place], [place] [address]    Report Status PENDING  Incomplete  Urine Culture     Status: None   Collection Time: 08/18/22  5:32 AM   Specimen: In/Out Cath Urine  Result Value Ref Range Status  Specimen Description   Final    IN/OUT CATH URINE Performed at [facility], [address] [place], [place] [address]    Special Requests   Final    NONE Performed at [facility], [address] [place], [place] [address]    Culture   Final    NO GROWTH Performed at [facility], [place] [address], [place], [place] [address]    Report Status 08/19/2022 FINAL  Final  Respiratory (~20 pathogens) panel by PCR     Status: None   Collection Time: 08/18/22  7:06 PM   Specimen: Sputum; Respiratory  Result Value Ref Range Status   Adenovirus NOT DETECTED NOT DETECTED Final   Coronavirus 229E NOT DETECTED NOT DETECTED Final    Comment: (NOTE) The Coronavirus on the Respiratory Panel, DOES NOT test for the novel  Coronavirus (2019 nCoV)    Coronavirus HKU1 NOT DETECTED NOT DETECTED Final   Coronavirus NL63 NOT DETECTED NOT DETECTED Final   Coronavirus OC43 NOT DETECTED NOT DETECTED Final   Metapneumovirus NOT DETECTED NOT DETECTED Final   Rhinovirus / Enterovirus NOT DETECTED NOT DETECTED Final   Influenza A NOT DETECTED NOT DETECTED Final   Influenza B NOT DETECTED NOT DETECTED Final   Parainfluenza Virus 1 NOT DETECTED NOT DETECTED Final   Parainfluenza Virus 2 NOT DETECTED NOT DETECTED Final   Parainfluenza Virus 3 NOT DETECTED NOT DETECTED Final   Parainfluenza Virus 4 NOT DETECTED NOT DETECTED Final   Respiratory Syncytial Virus NOT DETECTED NOT DETECTED Final   Bordetella pertussis NOT DETECTED NOT DETECTED Final   Bordetella Parapertussis NOT DETECTED NOT DETECTED Final   Chlamydophila pneumoniae NOT DETECTED NOT DETECTED Final   Mycoplasma pneumoniae NOT DETECTED NOT DETECTED Final    Comment: Performed at [facility], [place]. [address], [place], [place] [address]  MRSA Next Gen by PCR, Nasal     Status: Abnormal   Collection Time: 08/19/22  6:30 PM   Specimen: Nasal Mucosa; Nasal Swab  Result Value Ref Range Status   MRSA by PCR Next Gen DETECTED (A) NOT DETECTED Final    Comment: RESULT CALLED TO, READ  BACK BY AND VERIFIED WITH: RN K. GENGLER [id number] @2154  FH (NOTE) The GeneXpert MRSA Assay (FDA approved for NASAL specimens only), is one component of a comprehensive MRSA colonization surveillance program. It is not intended to diagnose MRSA infection nor to guide or monitor treatment for MRSA infections. Test performance is not FDA approved in patients less than 49 years old. Performed at [facility], [place] [address] [place], [place] [address]      Time coordinating discharge: 35 minutes  SIGNED:   Aline August, MD  [facility] 08/21/2022, 9:39 AM

## 2022-08-23 LAB — CULTURE, BLOOD (ROUTINE X 2)
Culture: NO GROWTH
Culture: NO GROWTH
Special Requests: ADEQUATE

## 2022-09-01 ENCOUNTER — Ambulatory Visit: Payer: Medicaid Other | Admitting: Internal Medicine

## 2023-02-04 ENCOUNTER — Other Ambulatory Visit (HOSPITAL_COMMUNITY): Payer: Self-pay

## 2023-02-15 ENCOUNTER — Other Ambulatory Visit: Payer: Self-pay

## 2023-02-15 DIAGNOSIS — Z79899 Other long term (current) drug therapy: Secondary | ICD-10-CM

## 2023-02-15 DIAGNOSIS — Z113 Encounter for screening for infections with a predominantly sexual mode of transmission: Secondary | ICD-10-CM

## 2023-02-15 DIAGNOSIS — B20 Human immunodeficiency virus [HIV] disease: Secondary | ICD-10-CM

## 2023-02-17 ENCOUNTER — Other Ambulatory Visit: Payer: Medicaid Other

## 2023-02-17 ENCOUNTER — Ambulatory Visit: Payer: Medicaid Other

## 2023-03-03 ENCOUNTER — Ambulatory Visit (INDEPENDENT_AMBULATORY_CARE_PROVIDER_SITE_OTHER): Payer: Medicaid Other | Admitting: Internal Medicine

## 2023-03-03 ENCOUNTER — Other Ambulatory Visit: Payer: Self-pay

## 2023-03-03 ENCOUNTER — Other Ambulatory Visit (HOSPITAL_COMMUNITY): Payer: Self-pay

## 2023-03-03 VITALS — BP 138/88 | HR 87 | Ht 71.0 in | Wt 160.0 lb

## 2023-03-03 DIAGNOSIS — Z113 Encounter for screening for infections with a predominantly sexual mode of transmission: Secondary | ICD-10-CM

## 2023-03-03 DIAGNOSIS — B182 Chronic viral hepatitis C: Secondary | ICD-10-CM

## 2023-03-03 DIAGNOSIS — Z111 Encounter for screening for respiratory tuberculosis: Secondary | ICD-10-CM

## 2023-03-03 DIAGNOSIS — B20 Human immunodeficiency virus [HIV] disease: Secondary | ICD-10-CM | POA: Diagnosis not present

## 2023-03-03 MED ORDER — BICTEGRAVIR-EMTRICITAB-TENOFOV 50-200-25 MG PO TABS: 1.0000 | ORAL_TABLET | Freq: Every day | ORAL | 11 refills | Status: DC

## 2023-03-03 MED ORDER — BICTEGRAVIR-EMTRICITAB-TENOFOV 50-200-25 MG PO TABS
1.0000 | ORAL_TABLET | Freq: Every day | ORAL | 11 refills | Status: DC
  Filled 2023-03-03: qty 30, 30d supply, fill #0

## 2023-03-03 MED ORDER — VALACYCLOVIR HCL 500 MG PO TABS
500.0000 mg | ORAL_TABLET | Freq: Every day | ORAL | 11 refills | Status: AC
  Filled 2023-03-03: qty 30, 30d supply, fill #0

## 2023-03-03 MED ORDER — VALACYCLOVIR HCL 500 MG PO TABS: 500.0000 mg | ORAL_TABLET | Freq: Every day | ORAL | 11 refills | Status: DC

## 2023-03-03 NOTE — Addendum Note (Signed)
Addended by: Harley Alto on: 03/03/2023 11:57 AM   Modules accepted: Orders

## 2023-03-03 NOTE — Progress Notes (Addendum)
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   Genital herpes 12/14/2019   Severe recurrent major depression w/psychotic features, mood-congruent (HCC) 10/04/2018   Transgender    Pleural effusion, bacterial 11/23/2017   Loculated pleural effusion 11/23/2017   Chronic hepatitis C without hepatic coma (HCC) 11/19/2017   Septic pulmonary embolism (HCC) 11/19/2017   IVDU (intravenous drug user)    Endocarditis, suspected 11/18/2017   HIV disease (HCC) 04/07/2016   Cigarette smoker 04/07/2016   Polysubstance abuse (HCC) 04/07/2016   Depression 08/14/2015   Conversion disorder 08/14/2015   Hemorrhoids, internal, with bleeding 08/25/2011   Anal warts 08/25/2011   ANXIETY 09/24/2010   ADHD 09/24/2010    Patient's Medications  New Prescriptions   No medications on file  Previous Medications   ACETAMINOPHEN (TYLENOL) 325 MG TABLET    Take 650 mg by mouth every 6 (six) hours as needed.   BICTEGRAVIR-EMTRICITABINE-TENOFOVIR AF (BIKTARVY) 50-200-25 MG TABS TABLET    Take 1 tablet by mouth daily.   FLUOXETINE (PROZAC) 10 MG CAPSULE    Take 1 capsule (10 mg total) by mouth daily.   OMEGA-3 ACID ETHYL ESTERS (LOVAZA) 1 G CAPSULE    Take 1 capsule (1 g total) by mouth 2 (two) times daily.   VALACYCLOVIR (VALTREX) 500 MG TABLET    Take 1 tablet by mouth 2 times daily  Modified Medications   No medications on file  Discontinued Medications   No medications on file    Subjective: Pedro Owens is in for her first appointment since January of last year.  He has been out of Biktarvy for the past 3 months.  He did not recertify his ADAP until 3 days ago.  He says he was not missing doses before he ran out.  He is currently living in an apartment with a roommate.  He does not have a job.  He says he has been smoking marijuana but denies using cocaine or other drugs.  He denies feeling anxious or depressed.  He tells me  that he never started Harvoni when I attempted to treat his hepatitis C several years ago.   03/03/23 id f/u He is here with mother Patient had fallen off hiv medication due to lapse in insurance (he was on ryan Morton previously) but didn't reach out. He lapsed for about a year He never took harvoni and wonders about treatment Mother does all the talking They denies him having any health concern otherwise  Social -- nonemployed. Not sexually active/in a relationship   He doesn't really say anything today so hx limited via chart. Not sure if he is heterosexual or msm preference  He does need to reengage with finance and pharmacy today   Asking for valtrex prophy   Review of Systems: Review of Systems  Constitutional:  Negative for fever and malaise/fatigue.  Psychiatric/Behavioral:  Negative for depression. The patient is not nervous/anxious.     Past Medical History:  Diagnosis Date   ADHD (attention deficit hyperactivity disorder)    Anal warts    Anxiety    Asthma    Bipolar disorder (HCC)    Chronic bronchitis (HCC)    Chronic pain syndrome    Convulsions (HCC) 08/14/2015   Depression    Gonorrhea 09/08/11   Headache    "weekly" (11/06/2016)   Hemorrhoids, internal, with bleeding 08/25/2011   Hepatitis C  Herpes    HIV infection (HCC)    Hypertension    ILEITIS 09/24/2010   Qualifier: Diagnosis of  By: Candice Camp CMA (AAMA), Dottie     Lymphoid hyperplasia, reactive    Marijuana abuse    Migraine    "monthly" (11/06/2016)   Pseudoseizures    Seizures (HCC)    "don't know why I have them" (11/06/2016)   Syphilis     Social History   Tobacco Use   Smoking status: Every Day    Packs/day: 1.50    Years: 11.00    Additional pack years: 0.00    Total pack years: 16.50    Types: Cigarettes   Smokeless tobacco: Never  Vaping Use   Vaping Use: Never used  Substance Use Topics   Alcohol use: Yes    Comment: occ   Drug use: Yes    Frequency: 2.0 times  per week    Types: Cocaine, Benzodiazepines, Oxycodone, Marijuana, Heroin    Comment: "just marijuana"     Family History  Problem Relation Age of Onset   Diabetes Mother    Irritable bowel syndrome Mother    Hypertension Mother    Hyperlipidemia Mother    Hypothyroidism Mother    Diabetes Maternal Uncle    Heart disease Maternal Grandmother    Leukemia Other        maternal greatgrandmother   Colon cancer Neg Hx     Allergies  Allergen Reactions   Acetaminophen Diarrhea and Nausea And Vomiting    Flares up crohns disease Flares up crohns disease Flares up crohns disease   Other Other (See Comments)    IV CONTRAST IV CONTRAST   Vicodin [Hydrocodone-Acetaminophen] Nausea And Vomiting   Dilaudid [Hydromorphone Hcl] Hives and Rash    Health Maintenance  Topic Date Due   COVID-19 Vaccine (1) Never done   PAP SMEAR-Modifier  Never done   DTaP/Tdap/Td (2 - Td or Tdap) 05/11/2018   INFLUENZA VACCINE  05/20/2023   Hepatitis C Screening  Completed   HIV Screening  Completed   HPV VACCINES  Aged Out    Objective:  There were no vitals filed for this visit.  There is no height or weight on file to calculate BMI.  Physical Exam Constitutional:      Comments: He is very quiet and withdrawn.  Cardiovascular:     Rate and Rhythm: Normal rate and regular rhythm.     Heart sounds: No murmur heard. Pulmonary:     Effort: Pulmonary effort is normal.     Breath sounds: Normal breath sounds.  Psychiatric:     Comments: He is very calm and appropriate but with a very flat affect.     Lab Results Lab Results  Component Value Date   WBC 9.3 08/20/2022   HGB 13.9 08/20/2022   HCT 41.0 08/20/2022   MCV 86.5 08/20/2022   PLT 320 08/20/2022    Lab Results  Component Value Date   CREATININE 1.22 08/20/2022   BUN 14 08/20/2022   NA 136 08/20/2022   K 3.9 08/20/2022   CL 108 08/20/2022   CO2 23 08/20/2022    Lab Results  Component Value Date   ALT 36 08/20/2022    AST 28 08/20/2022   GGT 102 (H) 09/25/2019   ALKPHOS 64 08/20/2022   BILITOT <0.1 (L) 08/20/2022    Lab Results  Component Value Date   CHOL 132 02/12/2022   HDL 42 02/12/2022   LDLCALC 59 02/12/2022  TRIG 266 (H) 02/12/2022   CHOLHDL 3.1 02/12/2022   Lab Results  Component Value Date   LABRPR Reactive (A) 08/19/2022   RPRTITER 1:4 (H) 02/12/2022   HIV 1 RNA Quant  Date Value  08/19/2022 110 copies/mL  02/12/2022 CANCELED  03/12/2021 2,420 Copies/mL (H)   CD4 T Cell Abs (/uL)  Date Value  08/19/2022 521  03/12/2021 578  10/17/2020 262 (L)     Problem List Items Addressed This Visit       Other   HIV disease (HCC) - Primary   Relevant Orders   CBC   COMPLETE METABOLIC PANEL WITH GFR   HIV 1 RNA quant-no reflex-bld   HIV RNA, RTPCR W/R GT (RTI, PI,INT)   T-helper cells (CD4) count   Other Visit Diagnoses     Chronic hepatitis C with hepatic coma (HCC)       Relevant Orders   US ABDOMEN COMPLETE W/ELASTOGRAPHY   HCV RNA quant   Hepatitis B surface antibody,quantitative   Hepatitis B Surface AntiGEN   Hepatitis B Core Antibody, total   Screening for STDs (sexually transmitted diseases)       Relevant Orders   RPR   Fluorescent treponemal ab(fta)-IgG-bld   Urine cytology ancillary only([place])   Screening-pulmonary TB       Relevant Orders   QuantiFERON-TB Gold Plus        #hiv  Lab Results  Component Value Date   HIV1RNAQUANT 110 08/19/2022   HIV1RNAQUANT CANCELED 02/12/2022   HIV1RNAQUANT 2,420 (H) 03/12/2021   Lab Results  Component Value Date   CD4TCELL 34 08/19/2022   CD4TABS 521 08/19/2022    Retest all lab Restart biktarvy Labs today F/u 1 month Encourage compliance Discuss u=u   Discuss with finance/pharmacy and patient has medicaid -- so will forward rx to [facility] long pharmacy   #chronic hep c Rx'ed horvoni previously but never took according to him  -hcv rna level -elastography   #hcm -vaccination Will  review -hepatitis Rescreen hep b today -std Urine cytology Not able to get hx on sexual preference -metabolic N/a -cancer screening N/a  I have spent a total of 40 minutes of face-to-face and non-face-to-face time, excluding clinical staff time, preparing to see patient, ordering tests and/or medications, and provide counseling the patient   Raymondo Band, MD [facility] [phone] pager   [phone] cell 03/03/2023, 11:30 AM

## 2023-03-03 NOTE — Patient Instructions (Signed)
Please let us know if you ever run out of meds -- we always have meds for you doesn't matter if you have insurance or not   See the finance counselor today

## 2023-03-03 NOTE — Addendum Note (Signed)
Addended byRutha Bouchard T on: 03/03/2023 11:48 AM   Modules accepted: Orders

## 2023-03-04 ENCOUNTER — Other Ambulatory Visit (HOSPITAL_COMMUNITY): Payer: Self-pay

## 2023-03-04 ENCOUNTER — Other Ambulatory Visit: Payer: Self-pay | Admitting: Pharmacist

## 2023-03-04 DIAGNOSIS — B20 Human immunodeficiency virus [HIV] disease: Secondary | ICD-10-CM

## 2023-03-04 LAB — T-HELPER CELLS (CD4) COUNT (NOT AT ARMC)
CD4 % Helper T Cell: 28 % — ABNORMAL LOW (ref 33–65)
CD4 T Cell Abs: 298 /uL — ABNORMAL LOW (ref 400–1790)

## 2023-03-04 MED ORDER — BICTEGRAVIR-EMTRICITAB-TENOFOV 50-200-25 MG PO TABS: 1.0000 | ORAL_TABLET | Freq: Every day | ORAL | 0 refills | Status: AC

## 2023-03-04 NOTE — Progress Notes (Signed)
Medication Samples have been provided to the patient.  Drug name: Biktarvy        Strength: 50/200/25 mg       Qty: 7 tablets (1 bottles) LOT: [id number]   Exp.Date: 8/26  Dosing instructions: Take one tablet by mouth once daily  The patient has been instructed regarding the correct time, dose, and frequency of taking this medication, including desired effects and most common side effects.   Zafirah Vanzee, PharmD, CPP, BCIDP, AAHIVP Clinical Pharmacist Practitioner Infectious Diseases Clinical Pharmacist [facility]  

## 2023-03-05 ENCOUNTER — Ambulatory Visit (HOSPITAL_COMMUNITY): Payer: Medicaid Other

## 2023-03-08 ENCOUNTER — Other Ambulatory Visit (HOSPITAL_COMMUNITY): Payer: Self-pay

## 2023-03-10 ENCOUNTER — Other Ambulatory Visit (HOSPITAL_COMMUNITY): Payer: Self-pay

## 2023-03-11 ENCOUNTER — Telehealth: Payer: Self-pay

## 2023-03-11 NOTE — Telephone Encounter (Signed)
-----   Message from Raymondo Band, MD sent at 03/11/2023  1:38 PM EDT ----- Regarding: FW:  Hi team  Please get patient bqck for neurosuphilis evaluation in setting low cd4 count  I think he needs lp  He never really gives any history so doubt he will    We should get him to see [place] next week  30 min with any provider  Thanks ----- Message ----- From: Janace Hoard Lab Results In Sent: 03/03/2023   3:31 PM EDT To: Raymondo Band, MD

## 2023-03-11 NOTE — Telephone Encounter (Signed)
Spoke with Pedro Owens and her mom, they will come in next week to see [name] in a 30 minute slot to discuss elevated RPR.    Pedro Owens still has not gotten her Biktarvy from the pharmacy. Her mom reports that it is $4. Asked Pedro Owens to call us back if there's any trouble getting her medication.   Sandie Ano, RN

## 2023-03-17 LAB — HEPATITIS C RNA QUANTITATIVE
HCV Quantitative Log: 7.18 log IU/mL — ABNORMAL HIGH
HCV RNA, PCR, QN: 15300000 IU/mL — ABNORMAL HIGH

## 2023-03-17 LAB — COMPLETE METABOLIC PANEL WITH GFR
AG Ratio: 0.9 (calc) — ABNORMAL LOW (ref 1.0–2.5)
ALT: 66 U/L — ABNORMAL HIGH (ref 9–46)
AST: 79 U/L — ABNORMAL HIGH (ref 10–40)
Albumin: 3.9 g/dL (ref 3.6–5.1)
Alkaline phosphatase (APISO): 128 U/L (ref 36–130)
BUN: 14 mg/dL (ref 7–25)
CO2: 24 mmol/L (ref 20–32)
Calcium: 9.1 mg/dL (ref 8.6–10.3)
Chloride: 107 mmol/L (ref 98–110)
Creat: 0.89 mg/dL (ref 0.60–1.26)
Globulin: 4.3 g/dL (calc) — ABNORMAL HIGH (ref 1.9–3.7)
Glucose, Bld: 101 mg/dL — ABNORMAL HIGH (ref 65–99)
Potassium: 4.3 mmol/L (ref 3.5–5.3)
Sodium: 138 mmol/L (ref 135–146)
Total Bilirubin: 0.4 mg/dL (ref 0.2–1.2)
Total Protein: 8.2 g/dL — ABNORMAL HIGH (ref 6.1–8.1)
eGFR: 118 mL/min/{1.73_m2} (ref >=60)

## 2023-03-17 LAB — HIV-1 INTEGRASE GENOTYPE

## 2023-03-17 LAB — HIV-1 RNA QUANT-NO REFLEX-BLD
HIV 1 RNA Quant: 388000 Copies/mL — ABNORMAL HIGH
HIV-1 RNA Quant, Log: 5.59 Log cps/mL — ABNORMAL HIGH

## 2023-03-17 LAB — CBC
HCT: 46 % (ref 38.5–50.0)
Hemoglobin: 15.6 g/dL (ref 13.2–17.1)
MCH: 28.3 pg (ref 27.0–33.0)
MCHC: 33.9 g/dL (ref 32.0–36.0)
MCV: 83.3 fL (ref 80.0–100.0)
MPV: 10.7 fL (ref 7.5–12.5)
Platelets: 315 10*3/uL (ref 140–400)
RBC: 5.52 10*6/uL (ref 4.20–5.80)
RDW: 13.4 % (ref 11.0–15.0)
WBC: 8.2 10*3/uL (ref 3.8–10.8)

## 2023-03-17 LAB — RPR: RPR Ser Ql: REACTIVE — AB

## 2023-03-17 LAB — HIV RNA, RTPCR W/R GT (RTI, PI,INT)
HIV 1 RNA Quant: 362000 copies/mL — ABNORMAL HIGH
HIV-1 RNA Quant, Log: 5.56 Log copies/mL — ABNORMAL HIGH

## 2023-03-17 LAB — HIV-1 GENOTYPE: HIV-1 Genotype: DETECTED — AB

## 2023-03-17 LAB — QUANTIFERON-TB GOLD PLUS
Mitogen-NIL: 4.35 IU/mL
NIL: 0.02 IU/mL
QuantiFERON-TB Gold Plus: NEGATIVE
TB1-NIL: 0 IU/mL
TB2-NIL: 0 IU/mL

## 2023-03-17 LAB — HEPATITIS B SURFACE ANTIGEN: Hepatitis B Surface Ag: NONREACTIVE

## 2023-03-17 LAB — HEPATITIS B CORE ANTIBODY, TOTAL: Hep B Core Total Ab: NONREACTIVE

## 2023-03-17 LAB — T PALLIDUM AB: T Pallidum Abs: POSITIVE — AB

## 2023-03-17 LAB — RPR TITER: RPR Titer: 1:1024 {titer} — ABNORMAL HIGH

## 2023-03-17 LAB — HEPATITIS B SURFACE ANTIBODY, QUANTITATIVE: Hep B S AB Quant (Post): <5 m[IU]/mL — ABNORMAL LOW (ref >=10)

## 2023-03-18 ENCOUNTER — Ambulatory Visit: Payer: Medicaid Other | Admitting: Infectious Diseases

## 2023-03-18 NOTE — Telephone Encounter (Signed)
Called Zay on both numbers listed in chart to see if she was going to make it to her appointment today, no answer and unable to leave a message. Will attempt to contact her via MyChart.   Sandie Ano, RN

## 2023-03-19 NOTE — Telephone Encounter (Signed)
She should be evaluated in ed   That way it could be a one and done  Coming to clinic then deciding on more workup, picc, and hospital referral is to much for a non compliant patient

## 2023-03-19 NOTE — Telephone Encounter (Signed)
Called Zay, no answer. Left HIPAA compliant voicemail asking her to call back or check her MyChart for recommendations.   Sandie Ano, RN

## 2023-03-21 ENCOUNTER — Ambulatory Visit (HOSPITAL_COMMUNITY): Payer: Medicaid Other

## 2023-03-23 NOTE — Telephone Encounter (Signed)
Received call from patient's mother, [name] [facility]). We discussed that Dr. 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   Sheral Flow states Divante can't go until this Friday has he just got a new job. Encouraged her to try and take Jamesryan sooner rather than later. She will arrange to take Kwanza to the ED this Friday.   Sandie Ano, RN

## 2023-03-26 ENCOUNTER — Other Ambulatory Visit: Payer: Self-pay

## 2023-03-26 ENCOUNTER — Inpatient Hospital Stay (HOSPITAL_COMMUNITY)
Admission: EM | Admit: 2023-03-26 | Discharge: 2023-04-01 | DRG: 057 | Disposition: A | Discharge status: Home Health | Payer: Medicaid Other | Source: Ambulatory Visit | Attending: Internal Medicine | Admitting: Internal Medicine

## 2023-03-26 ENCOUNTER — Encounter (HOSPITAL_COMMUNITY): Payer: Self-pay

## 2023-03-26 DIAGNOSIS — J189 Pneumonia, unspecified organism: Secondary | ICD-10-CM

## 2023-03-26 DIAGNOSIS — Z806 Family history of leukemia: Secondary | ICD-10-CM

## 2023-03-26 DIAGNOSIS — Z8249 Family history of ischemic heart disease and other diseases of the circulatory system: Secondary | ICD-10-CM

## 2023-03-26 DIAGNOSIS — I10 Essential (primary) hypertension: Secondary | ICD-10-CM | POA: Diagnosis present

## 2023-03-26 DIAGNOSIS — A523 Neurosyphilis, unspecified: Principal | ICD-10-CM | POA: Diagnosis present

## 2023-03-26 DIAGNOSIS — H538 Other visual disturbances: Secondary | ICD-10-CM | POA: Diagnosis present

## 2023-03-26 DIAGNOSIS — Z886 Allergy status to analgesic agent status: Secondary | ICD-10-CM

## 2023-03-26 DIAGNOSIS — Z83438 Family history of other disorder of lipoprotein metabolism and other lipidemia: Secondary | ICD-10-CM

## 2023-03-26 DIAGNOSIS — Z91199 Patient's noncompliance with other medical treatment and regimen due to unspecified reason: Secondary | ICD-10-CM

## 2023-03-26 DIAGNOSIS — Z833 Family history of diabetes mellitus: Secondary | ICD-10-CM

## 2023-03-26 DIAGNOSIS — B009 Herpesviral infection, unspecified: Secondary | ICD-10-CM | POA: Diagnosis present

## 2023-03-26 DIAGNOSIS — F17210 Nicotine dependence, cigarettes, uncomplicated: Secondary | ICD-10-CM | POA: Diagnosis present

## 2023-03-26 DIAGNOSIS — J4489 Other specified chronic obstructive pulmonary disease: Secondary | ICD-10-CM | POA: Diagnosis present

## 2023-03-26 DIAGNOSIS — R21 Rash and other nonspecific skin eruption: Principal | ICD-10-CM

## 2023-03-26 DIAGNOSIS — R748 Abnormal levels of other serum enzymes: Secondary | ICD-10-CM | POA: Diagnosis present

## 2023-03-26 DIAGNOSIS — Z91041 Radiographic dye allergy status: Secondary | ICD-10-CM

## 2023-03-26 DIAGNOSIS — A539 Syphilis, unspecified: Secondary | ICD-10-CM | POA: Diagnosis not present

## 2023-03-26 DIAGNOSIS — Z79899 Other long term (current) drug therapy: Secondary | ICD-10-CM

## 2023-03-26 DIAGNOSIS — F319 Bipolar disorder, unspecified: Secondary | ICD-10-CM | POA: Diagnosis present

## 2023-03-26 DIAGNOSIS — G894 Chronic pain syndrome: Secondary | ICD-10-CM | POA: Diagnosis present

## 2023-03-26 DIAGNOSIS — Z21 Asymptomatic human immunodeficiency virus [HIV] infection status: Secondary | ICD-10-CM | POA: Diagnosis present

## 2023-03-26 DIAGNOSIS — Z86711 Personal history of pulmonary embolism: Secondary | ICD-10-CM

## 2023-03-26 DIAGNOSIS — A528 Late syphilis, latent: Secondary | ICD-10-CM

## 2023-03-26 DIAGNOSIS — Y844 Aspiration of fluid as the cause of abnormal reaction of the patient, or of later complication, without mention of misadventure at the time of the procedure: Secondary | ICD-10-CM | POA: Diagnosis not present

## 2023-03-26 DIAGNOSIS — B182 Chronic viral hepatitis C: Secondary | ICD-10-CM | POA: Diagnosis present

## 2023-03-26 DIAGNOSIS — N509 Disorder of male genital organs, unspecified: Secondary | ICD-10-CM | POA: Diagnosis present

## 2023-03-26 DIAGNOSIS — Z885 Allergy status to narcotic agent status: Secondary | ICD-10-CM

## 2023-03-26 DIAGNOSIS — B20 Human immunodeficiency virus [HIV] disease: Secondary | ICD-10-CM | POA: Diagnosis present

## 2023-03-26 DIAGNOSIS — G971 Other reaction to spinal and lumbar puncture: Secondary | ICD-10-CM | POA: Diagnosis not present

## 2023-03-26 LAB — CBC
HCT: 46.1 % (ref 39.0–52.0)
Hemoglobin: 15.7 g/dL (ref 13.0–17.0)
MCH: 28.6 pg (ref 26.0–34.0)
MCHC: 34.1 g/dL (ref 30.0–36.0)
MCV: 84 fL (ref 80.0–100.0)
Platelets: 429 10*3/uL — ABNORMAL HIGH (ref 150–400)
RBC: 5.49 MIL/uL (ref 4.22–5.81)
RDW: 13.4 % (ref 11.5–15.5)
WBC: 7.1 10*3/uL (ref 4.0–10.5)
nRBC: 0 % (ref 0.0–0.2)

## 2023-03-26 LAB — BASIC METABOLIC PANEL
Anion gap: 9 (ref 5–15)
BUN: 17 mg/dL (ref 6–20)
CO2: 22 mmol/L (ref 22–32)
Calcium: 9.2 mg/dL (ref 8.9–10.3)
Chloride: 104 mmol/L (ref 98–111)
Creatinine, Ser: 1.06 mg/dL (ref 0.61–1.24)
GFR, Estimated: >60 mL/min (ref >60)
Glucose, Bld: 78 mg/dL (ref 70–99)
Potassium: 3.9 mmol/L (ref 3.5–5.1)
Sodium: 135 mmol/L (ref 135–145)

## 2023-03-26 MED ORDER — SENNOSIDES-DOCUSATE SODIUM 8.6-50 MG PO TABS: 1.0000 | ORAL_TABLET | Freq: Every evening | ORAL | Status: DC | PRN

## 2023-03-26 MED ORDER — IBUPROFEN 200 MG PO TABS
400.0000 mg | ORAL_TABLET | Freq: Four times a day (QID) | ORAL | Status: DC | PRN
  Administered 2023-03-27 – 2023-03-31 (×7): 400 mg via ORAL
  Filled 2023-03-26 (×7): qty 2

## 2023-03-26 MED ORDER — VALACYCLOVIR HCL 500 MG PO TABS
500.0000 mg | ORAL_TABLET | Freq: Every day | ORAL | Status: DC
  Administered 2023-03-26 – 2023-04-01 (×7): 500 mg via ORAL
  Filled 2023-03-26 (×9): qty 1

## 2023-03-26 MED ORDER — ONDANSETRON HCL 4 MG/2ML IJ SOLN: 4.0000 mg | Freq: Four times a day (QID) | INTRAMUSCULAR | Status: DC | PRN

## 2023-03-26 MED ORDER — ONDANSETRON HCL 4 MG PO TABS: 4.0000 mg | ORAL_TABLET | Freq: Four times a day (QID) | ORAL | Status: DC | PRN

## 2023-03-26 MED ORDER — BICTEGRAVIR-EMTRICITAB-TENOFOV 50-200-25 MG PO TABS
1.0000 | ORAL_TABLET | Freq: Every day | ORAL | Status: DC
  Administered 2023-03-27 – 2023-04-01 (×6): 1 via ORAL
  Filled 2023-03-26 (×7): qty 1

## 2023-03-26 NOTE — ED Provider Notes (Signed)
[place] EMERGENCY DEPARTMENT AT [facility] Provider Note   CSN: [id number] Arrival date & time: 03/26/23  1333     History  Chief Complaint  Patient presents with   Rash    Pedro Owens is a 32 y.o. adult with late latent syphilis, HIV, history of septic pulmonary embolism, history of IVDU, asthma, chronic hep C, polysubstance abuse, seizures, HTN, migraines, MDD/anxiety, transgender, psychotic features, conversion disorder who presents with rash.   Patient presents from ID clinic for admission related to neurosyphilis infection. Patient's A/O, ambulatory and only complaint is genital rash.  He states that he has had itchy genital rashes last a couple of months.  He states he was treated for syphilis about 2 years ago and had any problems with it since then.  He states that he does have a headache sometimes and sometimes he feels like his vision is blurry but overall right now he is doing okay.  He denies any, slurred speech, trouble, double vision, neck stiffness, trouble urinating, abdominal pain, nausea vomiting diarrhea constipation, dysuria/hematuria, hematochezia/melena.  Per chart review patient was last seen in clinic by Dr. Renold Don from infectious disease on 03/03/2023. He had been noncompliant w/ meds, and it seems as though infectious disease clinic has been attempting to direct admit this patient for several weeks but patient misses their appointments or doesn't come to ED.   Patient is accompanied by his mother who states that she was told he may need up to 2 weeks of antibiotics inpatient or PICC line to go home with.   Rash      Home Medications Prior to Admission medications   Medication Sig Start Date End Date Taking? Authorizing Provider  acetaminophen (TYLENOL) 325 MG tablet Take 650 mg by mouth every 6 (six) hours as needed. Patient not taking: Reported on 03/03/2023    [provider]  bictegravir-emtricitabine-tenofovir AF (BIKTARVY) 50-200-25  MG TABS tablet Take 1 tablet by mouth daily. 03/03/23   Vu, Gershon Mussel T, MD  FLUoxetine (PROZAC) 10 MG capsule Take 1 capsule (10 mg total) by mouth daily. Patient not taking: Reported on 10/11/2019 10/12/18   Malvin Johns, MD  omega-3 acid ethyl esters (LOVAZA) 1 g capsule Take 1 capsule (1 g total) by mouth 2 (two) times daily. Patient not taking: Reported on 08/15/2019 10/11/18   Malvin Johns, MD  valACYclovir (VALTREX) 500 MG tablet Take 1 tablet (500 mg total) by mouth daily. 03/03/23 02/26/24  Vu, Gershon Mussel T, MD  divalproex (DEPAKOTE) 500 MG DR tablet Take 1 tablet (500 mg total) by mouth 2 (two) times daily. 02/28/16 02/28/16  Lindalou Hose, MD      Allergies    Acetaminophen, Other, Vicodin [hydrocodone-acetaminophen], and Dilaudid [hydromorphone hcl]    Review of Systems   Review of Systems  Skin:  Positive for rash.   Review of systems Negative for f/c.  A 10 point review of systems was performed and is negative unless otherwise reported in HPI.  Physical Exam Updated Vital Signs BP 127/80   Pulse 86   Temp 97.9 F (36.6 C) (Oral)   Resp 16   Ht 5\' 11"  (1.803 m)   Wt 72.6 kg   SpO2 100%   BMI 22.32 kg/m  Physical Exam General: Normal appearing male, lying in bed.  HEENT: PERRLA, EOMI, Sclera anicteric, MMM, trachea midline. Normal speech. Cardiology: RRR, no murmurs/rubs/gallops. BL radial and DP pulses equal bilaterally.  Resp: Normal respiratory rate and effort. CTAB, no wheezes, rhonchi, crackles.  Abd: Soft, non-tender, non-distended. No rebound tenderness or guarding.  GU: Healing genital ulcers.  Throughout shaft of penis and scrotum bilaterally.  Nontender, nonerythematous.  No purulent drainage or open wounds. MSK: No peripheral edema or signs of trauma. Extremities without deformity or TTP. No cyanosis or clubbing. Skin: warm, dry. No rashes or lesions. Back: No midline C-spine TTP Neuro: A&Ox4, CNs II-XII grossly intact. 5/5 strength all extremities. Sensation grossly  intact. Normal speech. Psych: Normal mood and affect.   ED Results / Procedures / Treatments   Labs (all labs ordered are listed, but only abnormal results are displayed) Labs Reviewed  CBC - Abnormal; Notable for the following components:      Result Value   Platelets 429 (*)    All other components within normal limits  BASIC METABOLIC PANEL    EKG None  Radiology No results found.  Procedures Procedures    Medications Ordered in ED Medications - No data to display  ED Course/ Medical Decision Making/ A&P                          Medical Decision Making Amount and/or Complexity of Data Reviewed Labs: ordered.  Risk Decision regarding hospitalization.    MDM:    Pt with HIV noncompliant on HAART with history of syphilis presents w/ h/o treated syphilis several years ago now with painless genital ulcers. Presents from ID clinic for reported admission/treatment for syphilis. No electrolyte deranagements, leukocytosis, or anemia on labs. Called to d/w infectious disease who states that he may need LP and IV penicillin and she will see him in the AM. Patient is admitted to medicine. He is HDS at this time, afebrile.   Clinical Course as of 03/26/23 1807  Fri Mar 26, 2023  1803 BMP/CBC wnl. Consulted to infectious disease and hospitalist for admission [HN]    Clinical Course User Index [HN] Loetta Rough, MD    Labs: I Ordered, and personally interpreted labs.  The pertinent results include:  those listed above  Additional history obtained from mother at bedside, chart review.    Social Determinants of Health: Patient lives independently   Disposition:  Admit to hospitalist  Co morbidities that complicate the patient evaluation  Past Medical History:  Diagnosis Date   ADHD (attention deficit hyperactivity disorder)    Anal warts    Anxiety    Asthma    Bipolar disorder (HCC)    Chronic bronchitis (HCC)    Chronic pain syndrome    Convulsions  (HCC) 08/14/2015   Depression    Gonorrhea 09/08/11   Headache    "weekly" (11/06/2016)   Hemorrhoids, internal, with bleeding 08/25/2011   Hepatitis C    Herpes    HIV infection (HCC)    Hypertension    ILEITIS 09/24/2010   Qualifier: Diagnosis of  By: Candice Camp CMA (AAMA), Dottie     Lymphoid hyperplasia, reactive    Marijuana abuse    Migraine    "monthly" (11/06/2016)   Pseudoseizures    Seizures (HCC)    "don't know why I have them" (11/06/2016)   Syphilis      Medicines No orders of the defined types were placed in this encounter.   I have reviewed the patients home medicines and have made adjustments as needed  Problem List / ED Course: Problem List Items Addressed This Visit   None Visit Diagnoses     Rash of genital area    -  Primary                   This note was created using dictation software, which may contain spelling or grammatical errors.    Loetta Rough, MD 03/26/23 [phone]

## 2023-03-26 NOTE — Hospital Course (Signed)
Pedro Owens is a 32 y.o. adult with medical history significant for HIV, chronic hepatitis C, depression, medical nonadherence who is admitted per infectious disease recommendation for workup of possible neurosyphilis.  Infectious disease consult pending.

## 2023-03-26 NOTE — ED Triage Notes (Signed)
Patient presents from ID clinic for admission related to neurosyphilis infection. Patient's A/O, ambulatory and only complaint is genital rash.

## 2023-03-26 NOTE — Telephone Encounter (Signed)
Received call from Edwina, she is planning on taking Curlee to the ED today, but was hopeful that we could do a direct admission.   Discussed that we had wanted to do a direct admit last week, but that Daishaun missed their appointment and we are unable to do a direct admit unless we've seen the patient that same day.   Reassured her that ID team will be able to be consulted and help direct Subhan's care inpatient.   Sandie Ano, RN

## 2023-03-26 NOTE — ED Notes (Signed)
ED TO INPATIENT HANDOFF REPORT  ED Nurse Name and Phone #: Macio Kissoon RN BSN [phone]  S Name/Age/Gender Pedro Owens 32 y.o. adult Room/Bed: 029C/029C  Code Status   Code Status: Prior  Home/SNF/Other Home Patient oriented to: self, place, time, and situation Is this baseline? 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 Patient's A/O, ambulatory and only complaint is genital rash.   Allergies Allergies  Allergen Reactions   Acetaminophen Diarrhea, Nausea And Vomiting and Other (See Comments)    Flares up crohns disease    Other Other (See Comments)    IV CONTRAST    Vicodin [Hydrocodone-Acetaminophen] Nausea And Vomiting   Dilaudid [Hydromorphone Hcl] Hives and Rash    Level of Care/Admitting Diagnosis ED Disposition     ED Disposition  Admit   Condition  --   Comment  Hospital Area: [facility] [[id number]]  Level of Care: Med-Surg [16]  May place patient in observation at [facility] or [facility] if equivalent level of care is available:: No  Covid Evaluation: Asymptomatic - no recent exposure (last 10 days) testing not required  Diagnosis: Syphilis [829562]  Admitting Physician: Charlsie Quest [[id number]]  Attending Physician: Charlsie Quest [[id number]]          B Medical/Surgery History Past Medical History:  Diagnosis Date   ADHD (attention deficit hyperactivity disorder)    Anal warts    Anxiety    Asthma    Bipolar disorder (HCC)    Chronic bronchitis (HCC)    Chronic pain syndrome    Convulsions (HCC) 08/14/2015   Depression    Gonorrhea 09/08/11   Headache    "weekly" (11/06/2016)   Hemorrhoids, internal, with bleeding 08/25/2011   Hepatitis C    Herpes    HIV infection (HCC)    Hypertension    ILEITIS 09/24/2010   Qualifier: Diagnosis of  By: Candice Camp CMA (AAMA), Dottie     Lymphoid  hyperplasia, reactive    Marijuana abuse    Migraine    "monthly" (11/06/2016)   Pseudoseizures    Seizures (HCC)    "don't know why I have them" (11/06/2016)   Syphilis    Past Surgical History:  Procedure Laterality Date   ANKLE SURGERY Bilateral 2005   Screw & Pins   ANKLE SURGERY Right 2008   "replaced pins & screws"   FOOT SURGERY Bilateral 2005   "bone spurs"   I & D EXTREMITY Right 06/26/2016   Procedure: IRRIGATION AND DEBRIDEMENT RIGHT FOREARM;  Surgeon: Betha Loa, MD;  Location: [facility] OR;  Service: Orthopedics;  Laterality: Right;   I & D EXTREMITY Right 11/04/2016   Procedure: IRRIGATION AND DEBRIDEMENT EXTREMITY;  Surgeon: Dairl Ponder, MD;  Location: [facility] OR;  Service: Orthopedics;  Laterality: Right;   I & D EXTREMITY Right 11/06/2016   Procedure: IRRIGATION AND DEBRIDEMENT right forarm;  Surgeon: Dairl Ponder, MD;  Location: [facility] OR;  Service: Orthopedics;  Laterality: Right;   TEE WITHOUT CARDIOVERSION N/A 11/19/2017   Procedure: TRANSESOPHAGEAL ECHOCARDIOGRAM (TEE);  Surgeon: Pricilla Riffle, MD;  Location: [facility] ENDOSCOPY;  Service: Cardiovascular;  Laterality: N/A;   VIDEO ASSISTED THORACOSCOPY (VATS)/EMPYEMA Left 11/26/2017   Procedure: VIDEO ASSISTED THORACOSCOPY (VATS)/DRAIN EMPYEMA;  Surgeon: Kerin Perna, MD;  Location: [facility] OR;  Service: Thoracic;  Laterality: Left;     A IV Location/Drains/Wounds Patient Lines/Drains/Airways Status  Active Line/Drains/Airways     Name Placement date Placement time Site Days   Peripheral IV 03/26/23 20 G Anterior;Left;Proximal Forearm 03/26/23  1635  Forearm  less than 1   Incision (Closed) 11/04/16 Arm Right 11/04/16  1916  -- 2333   Incision (Closed) 11/06/16 Arm Right 11/06/16  1733  -- 2331   Incision (Closed) 11/26/17 Chest Other (Comment) 11/26/17  1608  -- 1946   Pressure Injury 11/26/17 Stage II -  Partial thickness loss of dermis presenting as a shallow open ulcer with a red, pink wound bed without slough. 11/26/17   0225  -- 1946   Wound / Incision (Open or Dehisced) 06/29/16 Incision - Open Arm Right;Posterior s/p I&D 06/29/16  1020  Arm  2461   Wound / Incision (Open or Dehisced) 06/29/16 Incision - Open Arm Right;Lateral s/p I&D 06/29/16  1033  Arm  2461            Intake/Output Last 24 hours No intake or output data in the 24 hours ending 03/26/23 1856  Labs/Imaging Results for orders placed or performed during the hospital encounter of 03/26/23 (from the past 48 hour(s))  CBC     Status: Abnormal   Collection Time: 03/26/23  1:33 PM  Result Value Ref Range   WBC 7.1 4.0 - 10.5 K/uL   RBC 5.49 4.22 - 5.81 MIL/uL   Hemoglobin 15.7 13.0 - 17.0 g/dL   HCT 40.9 81.1 - 91.4 %   MCV 84.0 80.0 - 100.0 fL   MCH 28.6 26.0 - 34.0 pg   MCHC 34.1 30.0 - 36.0 g/dL   RDW 78.2 95.6 - 21.3 %   Platelets 429 (H) 150 - 400 K/uL   nRBC 0.0 0.0 - 0.2 %    Comment: Performed at [facility], [address] [place], [place] [address]  Basic metabolic panel     Status: None   Collection Time: 03/26/23  1:33 PM  Result Value Ref Range   Sodium 135 135 - 145 mmol/L   Potassium 3.9 3.5 - 5.1 mmol/L   Chloride 104 98 - 111 mmol/L   CO2 22 22 - 32 mmol/L   Glucose, Bld 78 70 - 99 mg/dL    Comment: Glucose reference range applies only to samples taken after fasting for at least 8 hours.   BUN 17 6 - 20 mg/dL   Creatinine, Ser 8.46 0.61 - 1.24 mg/dL   Calcium 9.2 8.9 - 96.2 mg/dL   GFR, Estimated >95 >28 mL/min    Comment: (NOTE) Calculated using the CKD-EPI Creatinine Equation (2021)    Anion gap 9 5 - 15    Comment: Performed at [facility], [address] [place], [place] [address]   No results found.  Pending Labs Unresulted Labs (From admission, onward)    None       Vitals/Pain Today's Vitals   03/26/23 1340 03/26/23 1633 03/26/23 1645 03/26/23 1649  BP: (!) 134/105  127/80   Pulse: 72  86   Resp: 16  16   Temp: 98 F (36.7 C)   97.9 F (36.6 C)  TempSrc: Oral   Oral   SpO2: 99%  100%   Weight:  72.6 kg    Height:  5\' 11"  (1.803 m)    PainSc:        Isolation Precautions No active isolations  Medications Medications  bictegravir-emtricitabine-tenofovir AF (BIKTARVY) 50-200-25 MG per tablet 1 tablet (has no administration in time range)  valACYclovir (VALTREX) tablet 500 mg (has no administration in time range)    Mobility walks     Focused Assessments    R Recommendations: See Admitting Provider Note  Report given to:   Additional Notes:

## 2023-03-26 NOTE — H&P (Signed)
History and Physical    LARRON ERNZEN BJY:[id number] DOB: 1991-07-12 DOA: 03/26/2023  PCP: Diamantina Providence, FNP  Patient coming from: Home  I have personally briefly reviewed patient's old medical records in [facility]  Chief Complaint: Syphilis  HPI: Pedro Owens is a 32 y.o. adult with medical history significant for HIV, chronic hepatitis C, depression, medical nonadherence who presented to the ED per their primary infectious disease providers advice for admission and workup of possible neurosyphilis.  Patient recently seen in ID clinic 03/03/2023.  Labs were obtained and notable for: CD4 298 HIV RNA 388,000 Hepatitis C RNA 15.3 million RPR titer 1:1024 T. pallidum antibody positive  Infectious disease were hoping to have patient directly admitted however patient missed follow-up appointment in clinic.  Patient subsequently advised to present to the ED for admission and workup of possible neurosyphilis.  Patient reports a persistent genital rash.  They have not seen any purulent discharge from the rash lesions.  Patient reports intermittent episodes of blurry vision with headache the last few weeks.  No pain on eye movement.  Patient denies any change in hearing.  They deny any fevers, chills, diaphoresis, chest pain, dyspnea, cough, nausea, vomiting, abdominal pain, dysuria.  ED Course  Labs/Imaging on admission: I have personally reviewed following labs and imaging studies.  Initial vitals showed BP 134/105, pulse 72, RR 16, temp 98.0 F, SpO2 99% on room air.  Labs show WBC 7.1, hemoglobin 15.7, platelets 429,000, sodium 135, potassium 3.9, bicarb 22, BUN 17, creatinine 1.06, serum glucose 78.  EDP spoke with on-call ID, Dr. Drue Second, who recommended medical admission and they will see in consultation.  The hospitalist service was consulted to admit for further evaluation and management.  Review of Systems: All systems reviewed and are negative except as documented in  history of present illness above.   Past Medical History:  Diagnosis Date   ADHD (attention deficit hyperactivity disorder)    Anal warts    Anxiety    Asthma    Bipolar disorder (HCC)    Chronic bronchitis (HCC)    Chronic pain syndrome    Convulsions (HCC) 08/14/2015   Depression    Gonorrhea 09/08/11   Headache    "weekly" (11/06/2016)   Hemorrhoids, internal, with bleeding 08/25/2011   Hepatitis C    Herpes    HIV infection (HCC)    Hypertension    ILEITIS 09/24/2010   Qualifier: Diagnosis of  By: Candice Camp CMA (AAMA), Dottie     Lymphoid hyperplasia, reactive    Marijuana abuse    Migraine    "monthly" (11/06/2016)   Pseudoseizures    Seizures (HCC)    "don't know why I have them" (11/06/2016)   Syphilis     Past Surgical History:  Procedure Laterality Date   ANKLE SURGERY Bilateral 2005   Screw & Pins   ANKLE SURGERY Right 2008   "replaced pins & screws"   FOOT SURGERY Bilateral 2005   "bone spurs"   I & D EXTREMITY Right 06/26/2016   Procedure: IRRIGATION AND DEBRIDEMENT RIGHT FOREARM;  Surgeon: Betha Loa, MD;  Location: [facility] OR;  Service: Orthopedics;  Laterality: Right;   I & D EXTREMITY Right 11/04/2016   Procedure: IRRIGATION AND DEBRIDEMENT EXTREMITY;  Surgeon: Dairl Ponder, MD;  Location: [facility] OR;  Service: Orthopedics;  Laterality: Right;   I & D EXTREMITY Right 11/06/2016   Procedure: IRRIGATION AND DEBRIDEMENT right forarm;  Surgeon: Dairl Ponder, MD;  Location: [facility] OR;  Service: Orthopedics;  Laterality: Right;   TEE WITHOUT CARDIOVERSION N/A 11/19/2017   Procedure: TRANSESOPHAGEAL ECHOCARDIOGRAM (TEE);  Surgeon: Pricilla Riffle, MD;  Location: [facility] ENDOSCOPY;  Service: Cardiovascular;  Laterality: N/A;   VIDEO ASSISTED THORACOSCOPY (VATS)/EMPYEMA Left 11/26/2017   Procedure: VIDEO ASSISTED THORACOSCOPY (VATS)/DRAIN EMPYEMA;  Surgeon: Kerin Perna, MD;  Location: [facility] OR;  Service: Thoracic;  Laterality: Left;    Social History:  reports that she has  been smoking cigarettes. She has a 16.50 pack-year smoking history. She has never used smokeless tobacco. She reports current alcohol use. She reports current drug use. Frequency: 2.00 times per week. Drugs: Cocaine, Benzodiazepines, Oxycodone, Marijuana, and Heroin.  Allergies  Allergen Reactions   Acetaminophen Diarrhea, Nausea And Vomiting and Other (See Comments)    Flares up crohns disease    Other Other (See Comments)    IV CONTRAST    Vicodin [Hydrocodone-Acetaminophen] Nausea And Vomiting   Dilaudid [Hydromorphone Hcl] Hives and Rash    Family History  Problem Relation Age of Onset   Diabetes Mother    Irritable bowel syndrome Mother    Hypertension Mother    Hyperlipidemia Mother    Hypothyroidism Mother    Diabetes Maternal Uncle    Heart disease Maternal Grandmother    Leukemia Other        maternal greatgrandmother   Colon cancer Neg Hx      Prior to Admission medications   Medication Sig Start Date End Date Taking? Authorizing Provider  bictegravir-emtricitabine-tenofovir AF (BIKTARVY) 50-200-25 MG TABS tablet Take 1 tablet by mouth daily. 03/03/23  Yes Vu, Tonita Phoenix, MD  acetaminophen (TYLENOL) 325 MG tablet Take 650 mg by mouth every 6 (six) hours as needed. Patient not taking: Reported on 03/03/2023    [provider]  FLUoxetine (PROZAC) 10 MG capsule Take 1 capsule (10 mg total) by mouth daily. Patient not taking: Reported on 10/11/2019 10/12/18   Malvin Johns, MD  omega-3 acid ethyl esters (LOVAZA) 1 g capsule Take 1 capsule (1 g total) by mouth 2 (two) times daily. Patient not taking: Reported on 08/15/2019 10/11/18   Malvin Johns, MD  valACYclovir (VALTREX) 500 MG tablet Take 1 tablet (500 mg total) by mouth daily. 03/03/23 02/26/24  Vu, Gershon Mussel T, MD  divalproex (DEPAKOTE) 500 MG DR tablet Take 1 tablet (500 mg total) by mouth 2 (two) times daily. 02/28/16 02/28/16  Lindalou Hose, MD    Physical Exam: Vitals:   03/26/23 1800 03/26/23 1815 03/26/23  1830 03/26/23 1845  BP: 139/87 120/70 117/69 115/64  Pulse: (!) 51 (!) 58 (!) 59 (!) 46  Resp: 20 20 17 14   Temp:      TempSrc:      SpO2: 100% 100% 99% 99%  Weight:      Height:       Constitutional: Resting in bed, NAD, calm, comfortable Eyes: PERRL, EOMI, lids and conjunctivae normal ENMT: Mucous membranes are moist. Posterior pharynx clear of any exudate or lesions.Normal dentition.  Neck: normal, supple, no masses. Respiratory: clear to auscultation bilaterally, no wheezing, no crackles. Normal respiratory effort. No accessory muscle use.  Cardiovascular: Regular rate and rhythm, no murmurs / rubs / gallops. No extremity edema. 2+ pedal pulses. Abdomen: no tenderness, no masses palpated. No hepatosplenomegaly. Bowel sounds positive.  Musculoskeletal: no clubbing / cyanosis. No joint deformity upper and lower extremities. Good ROM, no contractures. Normal muscle tone.  Skin: No induration Neurologic: Sensation intact. Strength 5/5 in all 4.  Psychiatric: Alert and oriented  x 3. Normal mood.   EKG: Not performed.  Assessment/Plan Principal Problem:   Syphilis Active Problems:   HIV disease (HCC)   Chronic hepatitis C without hepatic coma (HCC)   Pedro Owens is a 32 y.o. adult with medical history significant for HIV, chronic hepatitis C, depression, medical nonadherence who is admitted per infectious disease recommendation for workup of possible neurosyphilis.  Infectious disease consult pending.  Assessment and Plan: Syphilis: RPR titer 1:1024 on 5/15.  ID recommended medical admission for possible workup of neurosyphilis.  Patient reports occasional episodes of blurry vision otherwise asymptomatic.  EDP spoke with ID, Dr. Drue Second; hold off on antibiotics, formal ID consult pending.  HIV: CD4 298, HIV RNA 388,000 on 5/15.  Continue Biktarvy.  Chronic hepatitis C: HCVRNA 15.3 million on 5/15.  Per ID documentation was prescribed Harvoni in the past but patient never took  it.  HSV prophylaxis: Continue Valtrex.   DVT prophylaxis: SCDs Start: 03/26/23 1910 Code Status: Full code Family Communication: Discussed with patient, they have discussed with family Disposition Plan: From home and likely discharge to home pending clinical progress Consults called: EDP discussed with infectious disease Dr. Drue Second Severity of Illness: The appropriate patient status for this patient is OBSERVATION. Observation status is judged to be reasonable and necessary in order to provide the required intensity of service to ensure the patient's safety. The patient's presenting symptoms, physical exam findings, and initial radiographic and laboratory data in the context of their medical condition is felt to place them at decreased risk for further clinical deterioration. Furthermore, it is anticipated that the patient will be medically stable for discharge from the hospital within 2 midnights of admission.   Darreld Mclean MD [facility]  If 7PM-7AM, please contact night-coverage [email or web address]  03/26/2023, 7:20 PM

## 2023-03-27 ENCOUNTER — Inpatient Hospital Stay (HOSPITAL_COMMUNITY): Payer: Medicaid Other

## 2023-03-27 DIAGNOSIS — I10 Essential (primary) hypertension: Secondary | ICD-10-CM | POA: Diagnosis present

## 2023-03-27 DIAGNOSIS — N509 Disorder of male genital organs, unspecified: Secondary | ICD-10-CM | POA: Diagnosis present

## 2023-03-27 DIAGNOSIS — Z83438 Family history of other disorder of lipoprotein metabolism and other lipidemia: Secondary | ICD-10-CM | POA: Diagnosis not present

## 2023-03-27 DIAGNOSIS — G894 Chronic pain syndrome: Secondary | ICD-10-CM | POA: Diagnosis present

## 2023-03-27 DIAGNOSIS — Z833 Family history of diabetes mellitus: Secondary | ICD-10-CM | POA: Diagnosis not present

## 2023-03-27 DIAGNOSIS — F17210 Nicotine dependence, cigarettes, uncomplicated: Secondary | ICD-10-CM | POA: Diagnosis present

## 2023-03-27 DIAGNOSIS — Z806 Family history of leukemia: Secondary | ICD-10-CM | POA: Diagnosis not present

## 2023-03-27 DIAGNOSIS — Z91199 Patient's noncompliance with other medical treatment and regimen due to unspecified reason: Secondary | ICD-10-CM | POA: Diagnosis not present

## 2023-03-27 DIAGNOSIS — G971 Other reaction to spinal and lumbar puncture: Secondary | ICD-10-CM | POA: Diagnosis not present

## 2023-03-27 DIAGNOSIS — H538 Other visual disturbances: Secondary | ICD-10-CM | POA: Diagnosis present

## 2023-03-27 DIAGNOSIS — Z86711 Personal history of pulmonary embolism: Secondary | ICD-10-CM | POA: Diagnosis not present

## 2023-03-27 DIAGNOSIS — Z885 Allergy status to narcotic agent status: Secondary | ICD-10-CM | POA: Diagnosis not present

## 2023-03-27 DIAGNOSIS — A539 Syphilis, unspecified: Secondary | ICD-10-CM | POA: Diagnosis not present

## 2023-03-27 DIAGNOSIS — Z8249 Family history of ischemic heart disease and other diseases of the circulatory system: Secondary | ICD-10-CM | POA: Diagnosis not present

## 2023-03-27 DIAGNOSIS — R748 Abnormal levels of other serum enzymes: Secondary | ICD-10-CM | POA: Diagnosis present

## 2023-03-27 DIAGNOSIS — B009 Herpesviral infection, unspecified: Secondary | ICD-10-CM | POA: Diagnosis present

## 2023-03-27 DIAGNOSIS — J4489 Other specified chronic obstructive pulmonary disease: Secondary | ICD-10-CM | POA: Diagnosis present

## 2023-03-27 DIAGNOSIS — Z886 Allergy status to analgesic agent status: Secondary | ICD-10-CM | POA: Diagnosis not present

## 2023-03-27 DIAGNOSIS — F319 Bipolar disorder, unspecified: Secondary | ICD-10-CM | POA: Diagnosis present

## 2023-03-27 DIAGNOSIS — Z79899 Other long term (current) drug therapy: Secondary | ICD-10-CM | POA: Diagnosis not present

## 2023-03-27 DIAGNOSIS — R21 Rash and other nonspecific skin eruption: Secondary | ICD-10-CM | POA: Diagnosis not present

## 2023-03-27 DIAGNOSIS — A523 Neurosyphilis, unspecified: Secondary | ICD-10-CM | POA: Diagnosis present

## 2023-03-27 DIAGNOSIS — A528 Late syphilis, latent: Secondary | ICD-10-CM | POA: Diagnosis not present

## 2023-03-27 DIAGNOSIS — Y844 Aspiration of fluid as the cause of abnormal reaction of the patient, or of later complication, without mention of misadventure at the time of the procedure: Secondary | ICD-10-CM | POA: Diagnosis not present

## 2023-03-27 DIAGNOSIS — Z91041 Radiographic dye allergy status: Secondary | ICD-10-CM | POA: Diagnosis not present

## 2023-03-27 DIAGNOSIS — B20 Human immunodeficiency virus [HIV] disease: Secondary | ICD-10-CM | POA: Diagnosis present

## 2023-03-27 DIAGNOSIS — B182 Chronic viral hepatitis C: Secondary | ICD-10-CM | POA: Diagnosis present

## 2023-03-27 DIAGNOSIS — B1920 Unspecified viral hepatitis C without hepatic coma: Secondary | ICD-10-CM | POA: Diagnosis not present

## 2023-03-27 DIAGNOSIS — J189 Pneumonia, unspecified organism: Secondary | ICD-10-CM | POA: Diagnosis not present

## 2023-03-27 LAB — CBC
HCT: 43.6 % (ref 39.0–52.0)
Hemoglobin: 15.3 g/dL (ref 13.0–17.0)
MCH: 29.4 pg (ref 26.0–34.0)
MCHC: 35.1 g/dL (ref 30.0–36.0)
MCV: 83.8 fL (ref 80.0–100.0)
Platelets: 369 10*3/uL (ref 150–400)
RBC: 5.2 MIL/uL (ref 4.22–5.81)
RDW: 13.7 % (ref 11.5–15.5)
WBC: 6.4 10*3/uL (ref 4.0–10.5)
nRBC: 0 % (ref 0.0–0.2)

## 2023-03-27 LAB — COMPREHENSIVE METABOLIC PANEL
ALT: 65 U/L — ABNORMAL HIGH (ref 0–44)
AST: 64 U/L — ABNORMAL HIGH (ref 15–41)
Albumin: 3.2 g/dL — ABNORMAL LOW (ref 3.5–5.0)
Alkaline Phosphatase: 118 U/L (ref 38–126)
Anion gap: 10 (ref 5–15)
BUN: 15 mg/dL (ref 6–20)
CO2: 21 mmol/L — ABNORMAL LOW (ref 22–32)
Calcium: 8.8 mg/dL — ABNORMAL LOW (ref 8.9–10.3)
Chloride: 104 mmol/L (ref 98–111)
Creatinine, Ser: 0.9 mg/dL (ref 0.61–1.24)
GFR, Estimated: >60 mL/min (ref >60)
Glucose, Bld: 95 mg/dL (ref 70–99)
Potassium: 3.9 mmol/L (ref 3.5–5.1)
Sodium: 135 mmol/L (ref 135–145)
Total Bilirubin: 0.5 mg/dL (ref 0.3–1.2)
Total Protein: 7.5 g/dL (ref 6.5–8.1)

## 2023-03-27 MED ORDER — PENICILLIN G POTASSIUM 20000000 UNITS IJ SOLR
12.0000 10*6.[IU] | Freq: Two times a day (BID) | INTRAVENOUS | Status: DC
  Administered 2023-03-27 – 2023-04-01 (×10): 12 10*6.[IU] via INTRAVENOUS
  Filled 2023-03-27 (×13): qty 12

## 2023-03-27 NOTE — Progress Notes (Addendum)
[facility]    Date of Admission:  03/26/2023   Total days of antibiotics 1   ID: Pedro Owens is a 32 y.o. 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 CD 4 count of 298/VL 368K. Having symptoms of blurry vision, and found to have RPR 1:1,024. Sent to ED by Dr Renold Don, her HIV doc, for management of probable neurosyphilis.she has been taking biktarvy since last in clinic on 5/15. Scrotal and penile lesions are still present but dried up slightly since starting valtrex for which she thought those lesions were related to HSV infection. She noticed some blurry vision but no headache   Principal Problem:   Syphilis Active Problems:   HIV disease (HCC)   Chronic hepatitis C without hepatic coma (HCC)  Review of Systems  Constitutional: Negative for fever, chills, diaphoresis, activity change, appetite change, fatigue and unexpected weight change.  HENT: Negative for congestion, sore throat, rhinorrhea, sneezing, trouble swallowing and sinus pressure.  Eyes: + blurry vision. Negative for photophobia and visual disturbance.  Respiratory: Negative for cough, chest tightness, shortness of breath, wheezing and stridor.  Cardiovascular: Negative for chest pain, palpitations and leg swelling.  Gastrointestinal: Negative for nausea, vomiting, abdominal pain, diarrhea, constipation, blood in stool, abdominal distention and anal bleeding.  Genitourinary: Negative for dysuria, hematuria, flank pain and difficulty urinating.  Musculoskeletal: Negative for myalgias, back pain, joint swelling, arthralgias and gait problem.  Skin: +scrotal lesions. Negative for color change, pallor, rash and wound.  Neurological: Negative for dizziness, tremors, weakness and light-headedness.  Hematological: Negative for adenopathy. Does not bruise/bleed easily.  Psychiatric/Behavioral: Negative for behavioral problems, confusion, sleep disturbance,  dysphoric mood, decreased concentration and agitation.      Medications:   bictegravir-emtricitabine-tenofovir AF  1 tablet Oral Daily   valACYclovir  500 mg Oral Daily    Objective: Vital signs in last 24 hours: Temp:  [97.5 F (36.4 C)-98.5 F (36.9 C)] 97.5 F (36.4 C) (06/08 0801) Pulse Rate:  [46-86] 54 (06/08 0801) Resp:  [14-29] 16 (06/08 0801) BP: (113-139)/(64-105) 130/85 (06/08 0801) SpO2:  [98 %-100 %] 98 % (06/08 0801) Weight:  [72.6 kg] 72.6 kg (06/07 1633) Physical Exam  Constitutional:  oriented to person, place, and time. appears well-developed and well-nourished. No distress.  HENT: [place]/AT, PERRLA, no scleral icterus Mouth/Throat: Oropharynx is clear and moist. No oropharyngeal exudate.  Cardiovascular: Normal rate, regular rhythm and normal heart sounds. Exam reveals no gallop and no friction rub.  No murmur heard.  Pulmonary/Chest: Effort normal and breath sounds normal. No respiratory distress.  has no wheezes.  Neck = supple, no nuchal rigidity Abdominal: Soft. Bowel sounds are normal.  exhibits no distension. There is no tenderness.  Lymphadenopathy: no cervical adenopathy. No axillary adenopathy Neurological: alert and oriented to person, place, and time.  Skin: Skin is warm and dry. No rash noted. No erythema.  Psychiatric: a normal mood and affect.  behavior is normal.    Lab Results Recent Labs    03/26/23 1333 03/27/23 0443  WBC 7.1 6.4  HGB 15.7 15.3  HCT 46.1 43.6  NA 135 135  K 3.9 3.9  CL 104 104  CO2 22 21*  BUN 17 15  CREATININE 1.06 0.90   Liver Panel Recent Labs    03/27/23 0443  PROT 7.5  ALBUMIN 3.2*  AST 64*  ALT 65*  ALKPHOS 118  BILITOT 0.5   Sedimentation Rate No results for input(s): "ESRSEDRATE"  in the last 72 hours. C-Reactive Protein No results for input(s): "CRP" in the last 72 hours.  Microbiology: reviewed Studies/Results: No results found.   Assessment/Plan: Probably neurosyphilis = recommend to  get [facility]CT, so that IR can perform LP with csf labs, and CSF VDRL, and opening pressure. Will also check cSF CrAg, although less likely to be CM. Clinical symptoms c/w neurosyphilis. Will start treatment with iv penicillin plus plan to get picc line early next week. 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  I have personally spent 80 minutes involved in face-to-face and non-face-to-face activities for this patient on the day of the visit. Professional time spent includes the following activities: Preparing to see the patient (review of tests), Obtaining and/or reviewing separately obtained history (admission/discharge record), Performing a medically appropriate examination and/or evaluation , Ordering medications/tests/procedures, referring and communicating with other health care professionals, Documenting clinical information in the EMR, Independently interpreting results (not separately reported), Communicating results to the patient/family/caregiver, Counseling and educating the patient/family/caregiver and Care coordination (not separately reported).    [facility] for Infectious Diseases Pager: [phone]  03/27/2023, 1:10 PM

## 2023-03-27 NOTE — Progress Notes (Addendum)
PROGRESS NOTE    Pedro Owens  ZOX:[id number] DOB: 12/06/1990 DOA: 03/26/2023 PCP: Diamantina Providence, FNP   Brief Narrative:  Pedro Owens is a 32 y.o. adult with medical history significant for HIV, chronic hepatitis C, depression, medical nonadherence who presented to the ED per their primary infectious disease providers advice for admission and workup of possible neurosyphilis.  Patient complaining of persistent genital rash, intermittent blurry vision and headache for the past few weeks.   Patient recently seen in ID clinic 03/03/2023.  Labs were obtained and notable for: CD4 298 HIV RNA 388,000 Hepatitis C RNA 15.3 million RPR titer 1:1024 T. pallidum antibody positive   Infectious disease were hoping to have patient directly admitted however patient missed follow-up appointment in clinic.  Patient subsequently advised to present to the ED for admission and workup of possible neurosyphilis.   ED Course  Labs/Imaging on admission:BP 134/105, pulse 72, RR 16, temp 98.0 F, SpO2 99% on room air.   Labs show WBC 7.1, hemoglobin 15.7, platelets 429,000, sodium 135, potassium 3.9, bicarb 22, BUN 17, creatinine 1.06, serum glucose 78.   EDP spoke with on-call ID, Dr. Drue Second, who recommended medical admission and they will see in consultation.  The hospitalist service was consulted to admit for further evaluation and management.  Assessment & Plan:   Syphilis: -RPR titer 1:1024 on 5/15.   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  He needs LP for definitive diagnosis. -Ordered CT head without contrast prior to LP. -IR consulted for LP. -Started on IV penicillin per ID.  Appreciate help  HIV: -CD4 298, HIV RNA 388,000 on 5/15.  Continue Biktarvy. -Management per ID   Chronic hepatitis C: -HCVRNA 15.3 million on 5/15.  Per ID documentation was prescribed Harvoni in the past but patient never took it.   HSV prophylaxis: Continue Valtrex.  Elevated liver enzymes: Chronic -Continue to  monitor  DVT prophylaxis: SCD Code Status: Full code Family Communication:  None present at bedside.  Plan of care discussed with patient in length and he verbalized understanding and agreed with it. Disposition Plan: To be determined  Consultants:  ID IR  Procedures:  None  Antimicrobials:  None  Status is: Observation   Subjective: Patient seen and examined.  He denies headache or blurry vision this morning.  Remained afebrile.  No acute events overnight  Objective: Vitals:   03/26/23 1845 03/26/23 2002 03/27/23 0506 03/27/23 0801  BP: 115/64 113/64 124/75 130/85  Pulse: (!) 46 (!) 54  (!) 54  Resp: 14 18 18 16   Temp:  97.7 F (36.5 C) 98.5 F (36.9 C) (!) 97.5 F (36.4 C)  TempSrc:  Oral  Oral  SpO2: 99% 99% 100% 98%  Weight:      Height:       No intake or output data in the 24 hours ending 03/27/23 1137 Filed Weights   03/26/23 1633  Weight: 72.6 kg    Examination:  General exam: Appears calm and comfortable, on room air, communicating well, watching TV Respiratory system: Clear to auscultation. Respiratory effort normal. Cardiovascular system: S1 & S2 heard, RRR. No JVD, murmurs, rubs, gallops or clicks. No pedal edema. Gastrointestinal system: Abdomen is nondistended, soft and nontender. No organomegaly or masses felt. Normal bowel sounds heard. Central nervous system: Alert and oriented. No focal neurological deficits. Extremities: Symmetric 5 x 5 power. Skin: No rashes, lesions or ulcers Psychiatry: Judgement and insight appear normal. Mood & affect appropriate.    Data Reviewed: I have personally reviewed  following labs and imaging studies  CBC: Recent Labs  Lab 03/26/23 1333 03/27/23 0443  WBC 7.1 6.4  HGB 15.7 15.3  HCT 46.1 43.6  MCV 84.0 83.8  PLT 429* 369   Basic Metabolic Panel: Recent Labs  Lab 03/26/23 1333 03/27/23 0443  NA 135 135  K 3.9 3.9  CL 104 104  CO2 22 21*  GLUCOSE 78 95  BUN 17 15  CREATININE 1.06 0.90   CALCIUM 9.2 8.8*   GFR: Estimated Creatinine Clearance (by C-G formula based on SCr of 0.9 mg/dL) Male: 161.0 mL/min Male: 123.2 mL/min Liver Function Tests: Recent Labs  Lab 03/27/23 0443  AST 64*  ALT 65*  ALKPHOS 118  BILITOT 0.5  PROT 7.5  ALBUMIN 3.2*   No results for input(s): "LIPASE", "AMYLASE" in the last 168 hours. No results for input(s): "AMMONIA" in the last 168 hours. Coagulation Profile: No results for input(s): "INR", "PROTIME" in the last 168 hours. Cardiac Enzymes: No results for input(s): "CKTOTAL", "CKMB", "CKMBINDEX", "TROPONINI" in the last 168 hours. BNP (last 3 results) No results for input(s): "PROBNP" in the last 8760 hours. HbA1C: No results for input(s): "HGBA1C" in the last 72 hours. CBG: No results for input(s): "GLUCAP" in the last 168 hours. Lipid Profile: No results for input(s): "CHOL", "HDL", "LDLCALC", "TRIG", "CHOLHDL", "LDLDIRECT" in the last 72 hours. Thyroid Function Tests: No results for input(s): "TSH", "T4TOTAL", "FREET4", "T3FREE", "THYROIDAB" in the last 72 hours. Anemia Panel: No results for input(s): "VITAMINB12", "FOLATE", "FERRITIN", "TIBC", "IRON", "RETICCTPCT" in the last 72 hours. Sepsis Labs: No results for input(s): "PROCALCITON", "LATICACIDVEN" in the last 168 hours.  No results found for this or any previous visit (from the past 240 hour(s)).    Radiology Studies: No results found.  Scheduled Meds:  bictegravir-emtricitabine-tenofovir AF  1 tablet Oral Daily   valACYclovir  500 mg Oral Daily   Continuous Infusions:   LOS: 0 days   Time spent: 35 minutes   Tashae Inda Estill Cotta, MD [facility]  If 7PM-7AM, please contact night-coverage [email or web address] 03/27/2023, 11:37 AM

## 2023-03-28 ENCOUNTER — Inpatient Hospital Stay (HOSPITAL_COMMUNITY): Payer: Medicaid Other

## 2023-03-28 DIAGNOSIS — A523 Neurosyphilis, unspecified: Secondary | ICD-10-CM | POA: Diagnosis not present

## 2023-03-28 DIAGNOSIS — A539 Syphilis, unspecified: Secondary | ICD-10-CM | POA: Diagnosis not present

## 2023-03-28 DIAGNOSIS — B1920 Unspecified viral hepatitis C without hepatic coma: Secondary | ICD-10-CM

## 2023-03-28 DIAGNOSIS — B20 Human immunodeficiency virus [HIV] disease: Secondary | ICD-10-CM

## 2023-03-28 LAB — CSF CELL COUNT WITH DIFFERENTIAL
Eosinophils, CSF: 0 % (ref 0–1)
Eosinophils, CSF: 0 % (ref 0–1)
Lymphs, CSF: 60 % (ref 40–80)
Lymphs, CSF: 72 % (ref 40–80)
Monocyte-Macrophage-Spinal Fluid: 10 % — ABNORMAL LOW (ref 15–45)
Monocyte-Macrophage-Spinal Fluid: 12 % — ABNORMAL LOW (ref 15–45)
RBC Count, CSF: 10000 /mm3 — ABNORMAL HIGH
RBC Count, CSF: 25000 /mm3 — ABNORMAL HIGH
Segmented Neutrophils-CSF: 16 % — ABNORMAL HIGH (ref 0–6)
Segmented Neutrophils-CSF: 30 % — ABNORMAL HIGH (ref 0–6)
Tube #: 1
Tube #: 4
WBC, CSF: 14 /mm3 (ref 0–5)
WBC, CSF: 8 /mm3 — ABNORMAL HIGH (ref 0–5)

## 2023-03-28 LAB — COMPREHENSIVE METABOLIC PANEL
ALT: 59 U/L — ABNORMAL HIGH (ref 0–44)
AST: 56 U/L — ABNORMAL HIGH (ref 15–41)
Albumin: 3.1 g/dL — ABNORMAL LOW (ref 3.5–5.0)
Alkaline Phosphatase: 163 U/L — ABNORMAL HIGH (ref 38–126)
Anion gap: 8 (ref 5–15)
BUN: 16 mg/dL (ref 6–20)
CO2: 21 mmol/L — ABNORMAL LOW (ref 22–32)
Calcium: 8.7 mg/dL — ABNORMAL LOW (ref 8.9–10.3)
Chloride: 106 mmol/L (ref 98–111)
Creatinine, Ser: 0.94 mg/dL (ref 0.61–1.24)
GFR, Estimated: >60 mL/min (ref >60)
Glucose, Bld: 121 mg/dL — ABNORMAL HIGH (ref 70–99)
Potassium: 4.1 mmol/L (ref 3.5–5.1)
Sodium: 135 mmol/L (ref 135–145)
Total Bilirubin: 0.3 mg/dL (ref 0.3–1.2)
Total Protein: 7.2 g/dL (ref 6.5–8.1)

## 2023-03-28 LAB — PROTEIN AND GLUCOSE, CSF
Glucose, CSF: 68 mg/dL (ref 40–70)
Total  Protein, CSF: 53 mg/dL — ABNORMAL HIGH (ref 15–45)

## 2023-03-28 MED ORDER — LIDOCAINE HCL (PF) 1 % IJ SOLN
3.0000 mL | Freq: Once | INTRAMUSCULAR | Status: AC
  Administered 2023-03-28: 3 mL via INTRADERMAL

## 2023-03-28 NOTE — Procedures (Signed)
Successful fluoro guided LP from L3-L4 without complication See Imaging report for full details.  Brayton El PA-C Interventional Radiology 03/28/2023 10:19 AM

## 2023-03-28 NOTE — Progress Notes (Signed)
[facility]    Date of Admission:  03/26/2023   Total days of antibiotics 2/pcn   ID: Pedro Owens is a 32 y.o. adult with  HIV-chronic HCV disease and concern for neurosyphilis Principal Problem:   Syphilis Active Problems:   HIV disease (HCC)   Chronic hepatitis C without hepatic coma (HCC)    Subjective: Afebrile. Has some back discomfort from undergoing LP.  LP CSF show: wbc 8, seg 16%, TP 53; glu 68  Medications:   bictegravir-emtricitabine-tenofovir AF  1 tablet Oral Daily   valACYclovir  500 mg Oral Daily    Objective: Vital signs in last 24 hours: Temp:  [98.1 F (36.7 C)-98.7 F (37.1 C)] 98.3 F (36.8 C) (06/09 0735) Pulse Rate:  [50-61] 50 (06/09 0735) Resp:  [16-18] 16 (06/09 0735) BP: (119-133)/(76-88) 119/76 (06/09 0735) SpO2:  [99 %] 99 % (06/09 0735)  Physical Exam  Constitutional: He is oriented to person, place, and time. He appears well-developed and well-nourished. No distress.  HENT:  Mouth/Throat: Oropharynx is clear and moist. No oropharyngeal exudate.  Cardiovascular: Normal rate, regular rhythm and normal heart sounds. Exam reveals no gallop and no friction rub.  No murmur heard.  Pulmonary/Chest: Effort normal and breath sounds normal. No respiratory distress. He has no wheezes.  Abdominal: Soft. Bowel sounds are normal. He exhibits no distension. There is no tenderness.  Lymphadenopathy:  He has no cervical adenopathy.  Neurological: He is alert and oriented to person, place, and time.  Skin: Skin is warm and dry. No rash noted. No erythema.  Psychiatric: He has a normal mood and affect. His behavior is normal.    Lab Results Recent Labs    03/26/23 1333 03/27/23 0443 03/28/23 0219  WBC 7.1 6.4  --   HGB 15.7 15.3  --   HCT 46.1 43.6  --   NA 135 135 135  K 3.9 3.9 4.1  CL 104 104 106  CO2 22 21* 21*  BUN 17 15 16   CREATININE 1.06 0.90 0.94   Liver Panel Recent Labs    03/27/23 0443  03/28/23 0219  PROT 7.5 7.2  ALBUMIN 3.2* 3.1*  AST 64* 56*  ALT 65* 59*  ALKPHOS 118 163*  BILITOT 0.5 0.3   Sedimentation Rate No results for input(s): "ESRSEDRATE" in the last 72 hours. C-Reactive Protein No results for input(s): "CRP" in the last 72 hours.  Microbiology: RPR: 1: 1024 Studies/Results: DG FL GUIDED LUMBAR PUNCTURE  Result Date: 03/28/2023 CLINICAL DATA:  History of HIV. Vision changes. Request for diagnostic lumbar puncture EXAM: DIAGNOSTIC LUMBAR PUNCTURE UNDER FLUOROSCOPIC GUIDANCE FLUOROSCOPY TIME:  Radiation Exposure Index (as provided by the fluoroscopic device): 2.80 mGy Number of Acquired Images:  2 PROCEDURE: Informed consent was obtained from the patient prior to the procedure, including potential complications of headache, allergy, and pain. With the patient prone, the lower back was prepped with Betadine. 1% Lidocaine was used for local anesthesia. Lumbar puncture was performed at the L3-L4 level using a 20 gauge needle with return of cloudy, dark pink CSF with an opening pressure of approximately 9 cm water. Approximately 10.5 ml of CSF were obtained for laboratory studies. The patient tolerated the procedure well and there were no apparent complications. IMPRESSION: Technically successful lumbar puncture from the L3-L4 level without complication. Procedure performed by Brayton El PA-C supervised by Dr. Kennith Center Electronically Signed   By: Kennith Center M.D.   On: 03/28/2023 11:50   CT  HEAD WO CONTRAST ( )  Result Date: 03/27/2023 CLINICAL DATA:  Initial evaluation for acute headache, immunocompromised. EXAM: CT HEAD WITHOUT CONTRAST TECHNIQUE: Contiguous axial images were obtained from the base of the skull through the vertex without intravenous contrast. RADIATION DOSE REDUCTION: This exam was performed according to the departmental dose-optimization program which includes automated exposure control, adjustment of the mA and/or kV according to patient  size and/or use of iterative reconstruction technique. COMPARISON:  Prior brain MRI from 03/19/2016. FINDINGS: Brain: Cerebral volume within normal limits for patient age. No evidence for acute intracranial hemorrhage. No findings to suggest acute large vessel territory infarct. No mass lesion, midline shift, or mass effect. Ventricles are normal in size without evidence for hydrocephalus. No extra-axial fluid collection identified. Vascular: No hyperdense vessel identified. Skull: Scalp soft tissues demonstrate no acute abnormality. Calvarium intact. Sinuses/Orbits: Globes and orbital soft tissues within normal limits. Mild mucosal thickening present about the left frontoethmoidal recess. Visualized paranasal sinuses are otherwise largely clear. No mastoid effusion. IMPRESSION: Normal head CT. No acute intracranial abnormality identified. Electronically Signed   By: Rise Mu M.D.   On: 03/27/2023 18:26     Assessment/Plan: Hiv disease = continue on biktarvy. VL at 388K off of treatment but recently started back on anti-retroviral therapy  HCV = untreated will need ultrasound of liver. 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 VDRL is pending. Will need picc line tomorrow  [facility] Pager: [phone]  03/28/2023, 2:19 PM

## 2023-03-28 NOTE — Progress Notes (Signed)
   Pedro Owens  ZOX:[id number] DOB: [birth year] DOA: 03/26/2023 PCP: Diamantina Providence, FNP    Brief Narrative:  32 year old transgender male with a history of HIV, latent syphilis, hepatitis C, IV drug abuse, depression, and medical noncompliance who was sent to the ER by their ID provider due to concern for possible neurosyphilis.  The patient was complaining of intermittent episodes of blurry vision with headache for several weeks.  Consultants:  ID IR  Goals of Care:  Code Status: Full Code   DVT prophylaxis: SCDs  Interim Hx: Afebrile.  Vital signs stable.  Underwent successful fluoroscopy guided LP in IR today.  Resting comfortably in bed with post procedural headache.  Denies any new complaints.  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 RPR titer 1:1024 03/03/23 - care per ID with IV penicillin being dosed - LP completed in IR today with VDRL pending - CT head unrevealing  HIV Most recent CD4 298 with viral load of 388K 03/03/23 -continue Biktarvy as per ID  Genital lesions Continue Valtrex for now, though ID suspects these are due to syphilis  Chronic hepatitis C HCVRNA 15.3 million 03/03/2023 -was reportedly prescribed Harvoni previously but never took it  HSV prophylaxis Continue Valtrex  Chronic transaminitis Due to above -monitor  Family Communication: No family present at time of exam Disposition: Anticipate discharge home with PICC line for ongoing outpatient penicillin therapy   Objective: Blood pressure 119/76, pulse (!) 50, temperature 98.3 F (36.8 C), temperature source Oral, resp. rate 16, height 5\' 11"  (1.803 m), weight 72.6 kg, SpO2 99 %.  Intake/Output Summary (Last 24 hours) at 03/28/2023 0937 Last data filed at 03/27/2023 1600 Gross per 24 hour  Intake 34.75 ml  Output --  Net 34.75 ml   Filed Weights   03/26/23 1633  Weight: 72.6 kg    Examination: General: No acute respiratory distress Lungs: Clear to auscultation bilaterally  without wheezes or crackles Cardiovascular: Regular rate and rhythm without murmur gallop or rub normal S1 and S2 Abdomen: Nontender, nondistended, soft, bowel sounds positive, no rebound, no ascites, no appreciable mass Extremities: No significant cyanosis, clubbing, or edema bilateral lower extremities  CBC: Recent Labs  Lab 03/26/23 1333 03/27/23 0443  WBC 7.1 6.4  HGB 15.7 15.3  HCT 46.1 43.6  MCV 84.0 83.8  PLT 429* 369   Basic Metabolic Panel: Recent Labs  Lab 03/26/23 1333 03/27/23 0443 03/28/23 0219  NA 135 135 135  K 3.9 3.9 4.1  CL 104 104 106  CO2 22 21* 21*  GLUCOSE 78 95 121*  BUN 17 15 16   CREATININE 1.06 0.90 0.94  CALCIUM 9.2 8.8* 8.7*   GFR: Estimated Creatinine Clearance (by C-G formula based on SCr of 0.94 mg/dL) Male: 81.1 mL/min Male: 118 mL/min   Scheduled Meds:  bictegravir-emtricitabine-tenofovir AF  1 tablet Oral Daily   valACYclovir  500 mg Oral Daily   Continuous Infusions:  penicillin G potassium 12 Million Units in dextrose 5 % 500 mL CONTINUOUS infusion 12 Million Units (03/28/23 0533)     LOS: 1 day   Lonia Blood, MD [facility] Office  [phone] Pager - Text Page per Loretha Stapler  If 7PM-7AM, please contact night-coverage per Amion 03/28/2023, 9:37 AM

## 2023-03-29 DIAGNOSIS — A523 Neurosyphilis, unspecified: Secondary | ICD-10-CM | POA: Diagnosis not present

## 2023-03-29 DIAGNOSIS — B182 Chronic viral hepatitis C: Secondary | ICD-10-CM | POA: Diagnosis not present

## 2023-03-29 DIAGNOSIS — F1920 Other psychoactive substance dependence, uncomplicated: Secondary | ICD-10-CM

## 2023-03-29 DIAGNOSIS — A539 Syphilis, unspecified: Secondary | ICD-10-CM | POA: Diagnosis not present

## 2023-03-29 DIAGNOSIS — B20 Human immunodeficiency virus [HIV] disease: Secondary | ICD-10-CM | POA: Diagnosis not present

## 2023-03-29 LAB — RAPID URINE DRUG SCREEN, HOSP PERFORMED
Amphetamines: NOT DETECTED
Barbiturates: NOT DETECTED
Benzodiazepines: NOT DETECTED
Cocaine: NOT DETECTED
Opiates: NOT DETECTED
Tetrahydrocannabinol: NOT DETECTED

## 2023-03-29 LAB — CRYPTOCOCCAL ANTIGEN, CSF: Crypto Ag: NEGATIVE

## 2023-03-29 MED ORDER — TRAMADOL HCL 50 MG PO TABS
50.0000 mg | ORAL_TABLET | Freq: Once | ORAL | Status: AC
  Administered 2023-03-29: 50 mg via ORAL
  Filled 2023-03-29: qty 1

## 2023-03-29 NOTE — Progress Notes (Signed)
   Pedro Owens  VHQ:[id number] DOB: 1991-02-24 DOA: 03/26/2023 PCP: Diamantina Providence, FNP    Brief Narrative:  32 year old transgender male with a history of HIV, latent syphilis, hepatitis C, IV drug abuse, depression, and medical noncompliance who was sent to the ER by their ID provider due to concern for possible neurosyphilis.  The patient was complaining of intermittent episodes of blurry vision with headache for several weeks.  Consultants:  ID IR  Goals of Care:  Code Status: Full Code   DVT prophylaxis: SCDs  Interim Hx: No acute events recorded overnight.  Afebrile.  Vital signs stable.  Tells me he has a new headache today.  Reports some upper chest wall pain bilaterally.  No abdominal pain.  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 RPR titer 1:1024 03/03/23 - care per ID with IV penicillin being dosed - LP completed in IR 6/9 with VDRL pending - CT head unrevealing  HIV Most recent CD4 298 with viral load of 388K 03/03/23 -continue Biktarvy as per ID  Genital lesions Continue Valtrex for now, though ID suspects these are due to syphilis  Chronic hepatitis C HCVRNA 15.3 million 03/03/2023 -was reportedly prescribed Harvoni previously but never took it  HSV prophylaxis Continue Valtrex  Chronic transaminitis Due to above -monitor intermittently  Family Communication: No family present at time of exam Disposition: Anticipate discharge home with PICC line for ongoing outpatient penicillin therapy   Objective: Blood pressure 111/76, pulse 62, temperature 98.6 F (37 C), temperature source Oral, resp. rate 16, height 5\' 11"  (1.803 m), weight 72.6 kg, SpO2 100 %. No intake or output data in the 24 hours ending 03/29/23 1017  Filed Weights   03/26/23 1633  Weight: 72.6 kg    Examination: General: No acute respiratory distress Lungs: Clear to auscultation bilaterally  Cardiovascular: Regular rate and rhythm without murmur  Abdomen: Nontender,  nondistended, soft, bowel sounds positive, no rebound Extremities: No significant edema bilateral lower extremities  CBC: Recent Labs  Lab 03/26/23 1333 03/27/23 0443  WBC 7.1 6.4  HGB 15.7 15.3  HCT 46.1 43.6  MCV 84.0 83.8  PLT 429* 369    Basic Metabolic Panel: Recent Labs  Lab 03/26/23 1333 03/27/23 0443 03/28/23 0219  NA 135 135 135  K 3.9 3.9 4.1  CL 104 104 106  CO2 22 21* 21*  GLUCOSE 78 95 121*  BUN 17 15 16   CREATININE 1.06 0.90 0.94  CALCIUM 9.2 8.8* 8.7*    GFR: Estimated Creatinine Clearance (by C-G formula based on SCr of 0.94 mg/dL) Male: 41.3 mL/min Male: 118 mL/min   Scheduled Meds:  bictegravir-emtricitabine-tenofovir AF  1 tablet Oral Daily   valACYclovir  500 mg Oral Daily   Continuous Infusions:  penicillin G potassium 12 Million Units in dextrose 5 % 500 mL CONTINUOUS infusion 12 Million Units (03/29/23 0541)     LOS: 2 days   Lonia Blood, MD [facility] Office  [phone] Pager - Text Page per Loretha Stapler  If 7PM-7AM, please contact night-coverage per Amion 03/29/2023, 10:17 AM

## 2023-03-29 NOTE — TOC CM/SW Note (Signed)
Transition of Care [facility]) - Inpatient Brief Assessment   Patient Details  Name: Pedro Owens MRN: [id number] Date of Birth: 1991-04-09  Transition of Care [facility]) CM/SW Contact:    Harriet Masson, RN Phone Number: 03/29/2023, 2:46 PM   Clinical Narrative: Infectious disease following patient for inpatient antibiotics.  TOC following.    Transition of Care Asessment: Insurance and Status: Insurance coverage has been reviewed Patient has primary care physician: Yes Home environment has been reviewed: safe to discharge home when medically stable Prior level of function:: independent Prior/Current Home Services: No current home services Social Determinants of Health Reivew: SDOH reviewed no interventions necessary Readmission risk has been reviewed: Yes Transition of care needs: no transition of care needs at this time

## 2023-03-29 NOTE — Progress Notes (Signed)
This RN gave patient specimen cup, wipes and instructed pt how to obtain urine specimen .  Pt verbalized understanding; does not have to void at this time.  Will attempt after drinking water.

## 2023-03-29 NOTE — Progress Notes (Addendum)
Subjective:  Still with some blurry vision in the right eye and headache   Antibiotics:  Anti-infectives (From admission, onward)    Start     Dose/Rate Route Frequency Ordered Stop   03/27/23 1400  penicillin G potassium 12 Million Units in dextrose 5 % 500 mL CONTINUOUS infusion        12 Million Units 41.7 mL/hr over 12 Hours Intravenous Every 12 hours 03/27/23 1309     03/27/23 1000  bictegravir-emtricitabine-tenofovir AF (BIKTARVY) 50-200-25 MG per tablet 1 tablet        1 tablet Oral Daily 03/26/23 1849     03/26/23 2000  valACYclovir (VALTREX) tablet 500 mg        500 mg Oral Daily 03/26/23 1849         Medications: Scheduled Meds:  bictegravir-emtricitabine-tenofovir AF  1 tablet Oral Daily   valACYclovir  500 mg Oral Daily   Continuous Infusions:  penicillin G potassium 12 Million Units in dextrose 5 % 500 mL CONTINUOUS infusion 12 Million Units (03/29/23 0541)   PRN Meds:.ibuprofen, ondansetron **OR** ondansetron (ZOFRAN) IV, senna-docusate    Objective: Weight change:  No intake or output data in the 24 hours ending 03/29/23 1103 Blood pressure 111/76, pulse 62, temperature 98.6 F (37 C), temperature source Oral, resp. rate 16, height 5\' 11"  (1.803 m), weight 72.6 kg, SpO2 100 %. Temp:  [98.1 F (36.7 C)-98.6 F (37 C)] 98.6 F (37 C) (06/10 0725) Pulse Rate:  [57-66] 62 (06/10 0725) Resp:  [16-18] 16 (06/10 0725) BP: (111-140)/(72-77) 111/76 (06/10 0725) SpO2:  [99 %-100 %] 100 % (06/10 0725) FiO2 (%):  [21 %] 21 % (06/09 1559)  Physical Exam: Physical Exam Constitutional:      Appearance: He is well-developed.  HENT:     Head: Normocephalic and atraumatic.  Eyes:     Conjunctiva/sclera: Conjunctivae normal.  Cardiovascular:     Rate and Rhythm: Normal rate and regular rhythm.  Pulmonary:     Effort: Pulmonary effort is normal. No respiratory distress.     Breath sounds: Normal breath sounds. No stridor. No wheezing.  Abdominal:      General: There is no distension.     Palpations: Abdomen is soft.  Musculoskeletal:        General: Normal range of motion.     Cervical back: Normal range of motion and neck supple.  Skin:    General: Skin is warm and dry.     Findings: No erythema or rash.  Neurological:     General: No focal deficit present.     Mental Status: He is alert and oriented to person, place, and time.  Psychiatric:        Mood and Affect: Mood normal.        Behavior: Behavior normal.        Thought Content: Thought content normal.        Judgment: Judgment normal.      CBC:    BMET Recent Labs    03/27/23 0443 03/28/23 0219  NA 135 135  K 3.9 4.1  CL 104 106  CO2 21* 21*  GLUCOSE 95 121*  BUN 15 16  CREATININE 0.90 0.94  CALCIUM 8.8* 8.7*     Liver Panel  Recent Labs    03/27/23 0443 03/28/23 0219  PROT 7.5 7.2  ALBUMIN 3.2* 3.1*  AST 64* 56*  ALT 65* 59*  ALKPHOS 118 163*  BILITOT 0.5 0.3  Sedimentation Rate No results for input(s): "ESRSEDRATE" in the last 72 hours. C-Reactive Protein No results for input(s): "CRP" in the last 72 hours.  Micro Results: No results found for this or any previous visit (from the past 720 hour(s)).  Studies/Results: DG FL GUIDED LUMBAR PUNCTURE  Result Date: 03/28/2023 CLINICAL DATA:  History of HIV. Vision changes. Request for diagnostic lumbar puncture EXAM: DIAGNOSTIC LUMBAR PUNCTURE UNDER FLUOROSCOPIC GUIDANCE FLUOROSCOPY TIME:  Radiation Exposure Index (as provided by the fluoroscopic device): 2.80 mGy Number of Acquired Images:  2 PROCEDURE: Informed consent was obtained from the patient prior to the procedure, including potential complications of headache, allergy, and pain. With the patient prone, the lower back was prepped with Betadine. 1% Lidocaine was used for local anesthesia. Lumbar puncture was performed at the L3-L4 level using a 20 gauge needle with return of cloudy, dark pink CSF with an opening pressure of  approximately 9 cm water. Approximately 10.5 ml of CSF were obtained for laboratory studies. The patient tolerated the procedure well and there were no apparent complications. IMPRESSION: Technically successful lumbar puncture from the L3-L4 level without complication. Procedure performed by Brayton El PA-C supervised by Dr. Kennith Center Electronically Signed   By: Kennith Center M.D.   On: 03/28/2023 11:50   CT HEAD WO CONTRAST ( )  Result Date: 03/27/2023 CLINICAL DATA:  Initial evaluation for acute headache, immunocompromised. EXAM: CT HEAD WITHOUT CONTRAST TECHNIQUE: Contiguous axial images were obtained from the base of the skull through the vertex without intravenous contrast. RADIATION DOSE REDUCTION: This exam was performed according to the departmental dose-optimization program which includes automated exposure control, adjustment of the mA and/or kV according to patient size and/or use of iterative reconstruction technique. COMPARISON:  Prior brain MRI from 03/19/2016. FINDINGS: Brain: Cerebral volume within normal limits for patient age. No evidence for acute intracranial hemorrhage. No findings to suggest acute large vessel territory infarct. No mass lesion, midline shift, or mass effect. Ventricles are normal in size without evidence for hydrocephalus. No extra-axial fluid collection identified. Vascular: No hyperdense vessel identified. Skull: Scalp soft tissues demonstrate no acute abnormality. Calvarium intact. Sinuses/Orbits: Globes and orbital soft tissues within normal limits. Mild mucosal thickening present about the left frontoethmoidal recess. Visualized paranasal sinuses are otherwise largely clear. No mastoid effusion. IMPRESSION: Normal head CT. No acute intracranial abnormality identified. Electronically Signed   By: Rise Mu M.D.   On: 03/27/2023 18:26      Assessment/Plan:  INTERVAL HISTORY: He is status lumbar puncture   Principal Problem:   Syphilis Active  Problems:   HIV disease (HCC)   Chronic hepatitis C without hepatic coma (HCC)    Pedro Owens is a 32 y.o. male with poorly controlled HIV disease chronic hepatitis C without hepatic coma and now neurosyphilis  #1 neurosyphilis:  Clinically he already had evidence of likely ocular and CNS infection lumbar puncture shows elevated Lunden blood cell count of 14,000 confirming the diagnosis.  Protein was 53 and glucose was 68.  VDRL is pending but he already has met criteria for Neurosyphilis  He will need 2 weeks of IV PCN and would give one dose of IM PCN at end of this  With his HCV + and hx of multiple drugs on prior drug screen I am skeptical he will be an easy candidate for home health and would hold off on PICC line at present  In the future he would benefit from DOXY-PEP  #2 HIV disease:  Says he has been  taking BIKTARVY since it was restarted in the clinic.  Will check a repeat viral load and CD4 count  #3.  Chronic hepatitis C without hepatic coma not checking hep C viral RNA would like to cure him  I have personally spent 52 minutes involved in face-to-face and non-face-to-face activities for this patient on the day of the visit. Professional time spent includes the following activities: Preparing to see the patient (review of tests), Obtaining and/or reviewing separately obtained history (admission/discharge record), Performing a medically appropriate examination and/or evaluation , Ordering medications/tests/procedures, referring and communicating with other health care professionals, Documenting clinical information in the EMR, Independently interpreting results (not separately reported), Communicating results to the patient/family/caregiver, Counseling and educating the patient/family/caregiver and Care coordination (not separately reported).       LOS: 2 days   Acey Lav 03/29/2023, 11:03 AM

## 2023-03-30 ENCOUNTER — Other Ambulatory Visit: Payer: Self-pay

## 2023-03-30 DIAGNOSIS — B182 Chronic viral hepatitis C: Secondary | ICD-10-CM | POA: Diagnosis not present

## 2023-03-30 DIAGNOSIS — B20 Human immunodeficiency virus [HIV] disease: Secondary | ICD-10-CM | POA: Diagnosis not present

## 2023-03-30 DIAGNOSIS — A539 Syphilis, unspecified: Secondary | ICD-10-CM | POA: Diagnosis not present

## 2023-03-30 LAB — HCV RNA QUANT

## 2023-03-30 LAB — CD4/CD8 (T-HELPER/T-SUPPRESSOR CELL)
CD4 absolute: 597 /uL (ref 400–1790)
CD4%: 24.53 % — ABNORMAL LOW (ref 33–65)
CD8 T Cell Abs: 1386 /uL — ABNORMAL HIGH (ref 190–1000)
CD8tox: 56.92 % — ABNORMAL HIGH (ref 12–40)
Ratio: 0.43 — ABNORMAL LOW (ref 1.0–3.0)
Total lymphocyte count: 2435 /uL (ref 1000–4000)

## 2023-03-30 LAB — VDRL, CSF: VDRL Quant, CSF: NONREACTIVE

## 2023-03-30 LAB — HCV RNA (INTERNATIONAL UNITS)
HCV RNA (International Units): 14200000 IU/mL
HCV log10: 7.152 log10 IU/mL

## 2023-03-30 LAB — PATHOLOGIST SMEAR REVIEW

## 2023-03-30 LAB — CSF CULTURE W GRAM STAIN: Culture: NO GROWTH

## 2023-03-30 LAB — HIV-1 RNA QUANT-NO REFLEX-BLD
HIV 1 RNA Quant: 90 copies/mL
LOG10 HIV-1 RNA: 1.954 log10copy/mL

## 2023-03-30 MED ORDER — TRAMADOL HCL 50 MG PO TABS
50.0000 mg | ORAL_TABLET | Freq: Once | ORAL | Status: AC | PRN
  Administered 2023-03-30: 50 mg via ORAL
  Filled 2023-03-30: qty 1

## 2023-03-30 MED ORDER — SODIUM CHLORIDE 0.9% FLUSH
10.0000 mL | Freq: Two times a day (BID) | INTRAVENOUS | Status: DC
  Administered 2023-03-31: 10 mL
  Administered 2023-03-31 – 2023-04-01 (×2): 20 mL

## 2023-03-30 MED ORDER — SODIUM CHLORIDE 0.9% FLUSH: 10.0000 mL | INTRAVENOUS | Status: DC | PRN

## 2023-03-30 MED ORDER — BUTALBITAL-APAP-CAFFEINE 50-325-40 MG PO TABS
1.0000 | ORAL_TABLET | Freq: Four times a day (QID) | ORAL | Status: DC | PRN
  Administered 2023-03-30 – 2023-03-31 (×2): 1 via ORAL
  Filled 2023-03-30 (×2): qty 1

## 2023-03-30 MED ORDER — CHLORHEXIDINE GLUCONATE CLOTH 2 % EX PADS
6.0000 | MEDICATED_PAD | Freq: Every day | CUTANEOUS | Status: DC
  Administered 2023-03-30 – 2023-04-01 (×3): 6 via TOPICAL

## 2023-03-30 NOTE — Plan of Care (Signed)
  Problem: Clinical Measurements: Goal: Will remain free from infection Outcome: Progressing   Problem: Coping: Goal: Level of anxiety will decrease Outcome: Progressing   Problem: Pain Managment: Goal: General experience of comfort will improve Outcome: Progressing   Problem: Safety: Goal: Ability to remain free from injury will improve Outcome: Progressing   Problem: Skin Integrity: Goal: Risk for impaired skin integrity will decrease Outcome: Progressing   

## 2023-03-30 NOTE — Progress Notes (Signed)
    [facility]    Date of Admission:  03/26/2023   Total days of antibiotics 4   ID: Pedro Owens is a 32 y.o. male with  neurosyphilis Principal Problem:   Syphilis Active Problems:   HIV disease (HCC)   Chronic hepatitis C without hepatic coma (HCC)    Subjective: Afebrile, but notices positional headache when he sits up. Scrotal/penile rash improving.  Peripheral ivs are needing to be replaced often  Medications:   bictegravir-emtricitabine-tenofovir AF  1 tablet Oral Daily   valACYclovir  500 mg Oral Daily    Objective: Vital signs in last 24 hours: Temp:  [97.8 F (36.6 C)-98.4 F (36.9 C)] 97.8 F (36.6 C) (06/11 0804) Pulse Rate:  [62-68] 68 (06/11 0804) Resp:  [15-20] 15 (06/11 0804) BP: (123-143)/(77-92) 127/90 (06/11 0804) SpO2:  [99 %-100 %] 100 % (06/11 0804)  Physical Exam  Constitutional: He is oriented to person, place, and time. He appears well-developed and well-nourished. No distress.  HENT:  Mouth/Throat: Oropharynx is clear and moist. No oropharyngeal exudate.  Cardiovascular: Normal rate, regular rhythm and normal heart sounds. Exam reveals no gallop and no friction rub.  No murmur heard.  Pulmonary/Chest: Effort normal and breath sounds normal. No respiratory distress. He has no wheezes.  Abdominal: Soft. Bowel sounds are normal. He exhibits no distension. There is no tenderness.  Lymphadenopathy:  He has no cervical adenopathy.  Neurological: He is alert and oriented to person, place, and time.  Skin: Skin is warm and dry. No rash noted. No erythema.  Psychiatric: He has a normal mood and affect. His behavior is normal.    Lab Results Recent Labs    03/28/23 0219  NA 135  K 4.1  CL 106  CO2 21*  BUN 16  CREATININE 0.94   Liver Panel Recent Labs    03/28/23 0219  PROT 7.2  ALBUMIN 3.1*  AST 56*  ALT 59*  ALKPHOS 163*  BILITOT 0.3   Sedimentation Rate No results for input(s): "ESRSEDRATE" in the  last 72 hours. C-Reactive Protein No results for input(s): "CRP" in the last 72 hours.  Microbiology:  Studies/Results: No results found.   Assessment/Plan: Neurosyphilis = plan to get picc line to complete 14 day course of IV penicillin. He is remote ivdu (3-4 yrs out) and lives with mother. I believe it is safe for him to have picc line.  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 If not, then may need blood patch.  [facility] for Infectious Diseases Pager: [phone]  03/30/2023, 11:54 AM

## 2023-03-30 NOTE — Progress Notes (Signed)
   Pedro Owens  JYN:[id number] DOB: 04-Jun-1991 DOA: 03/26/2023 PCP: Diamantina Providence, FNP    Brief Narrative:  32 year old with a history of HIV, latent syphilis, hepatitis C, IV drug abuse, depression, and medical noncompliance who was sent to the ER by their ID provider due to concern for possible neurosyphilis.  The patient was complaining of intermittent episodes of blurry vision with headache for several weeks.  Consultants:  ID IR  Goals of Care:  Code Status: Full Code   DVT prophylaxis: SCDs  Interim Hx: Afebrile.  Vital signs stable.  No acute events recorded overnight. Reports some ongoing waxing/waning HA.   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 RPR titer 1:1024 03/03/23 - care per ID with IV penicillin being dosed - LP completed in IR 6/9 with VDRL pending - CT head unrevealing - ID does now feel he is an appropriate candidate for PICC/outpt IV abx so PICC order has been placed - plan is for 2 weeks of IV PCN, followed by one dose of IM PCN  Post LP HA Waxing and waning - follow - may require blood patch if persists   HIV Most recent CD4 298 with viral load of 388K 03/03/23 - continue Biktarvy as per ID  Genital lesions Continue Valtrex, though ID suspects these are due to syphilis  Chronic hepatitis C HCVRNA 15.3 million 03/03/2023 -was reportedly prescribed Harvoni previously but never took it  HSV prophylaxis Continue Valtrex  Chronic transaminitis Due to above -monitor intermittently  Family Communication: No family present at time of exam Disposition: as per ID    Objective: Blood pressure (!) 127/90, pulse 68, temperature 97.8 F (36.6 C), temperature source Oral, resp. rate 15, height 5\' 11"  (1.803 m), weight 72.6 kg, SpO2 100 %.  Intake/Output Summary (Last 24 hours) at 03/30/2023 1014 Last data filed at 03/30/2023 0716 Gross per 24 hour  Intake 965.11 ml  Output 1 ml  Net 964.11 ml    Filed Weights   03/26/23 1633  Weight: 72.6 kg     Examination: General: No acute respiratory distress Lungs: Clear to auscultation bilaterally  Cardiovascular: Regular rate and rhythm without murmur  Abdomen: Nontender, nondistended, soft, bowel sounds positive, no rebound Extremities: No significant edema bilateral lower extremities  CBC: Recent Labs  Lab 03/26/23 1333 03/27/23 0443  WBC 7.1 6.4  HGB 15.7 15.3  HCT 46.1 43.6  MCV 84.0 83.8  PLT 429* 369    Basic Metabolic Panel: Recent Labs  Lab 03/26/23 1333 03/27/23 0443 03/28/23 0219  NA 135 135 135  K 3.9 3.9 4.1  CL 104 104 106  CO2 22 21* 21*  GLUCOSE 78 95 121*  BUN 17 15 16   CREATININE 1.06 0.90 0.94  CALCIUM 9.2 8.8* 8.7*    GFR: Estimated Creatinine Clearance: 118 mL/min (by C-G formula based on SCr of 0.94 mg/dL).   Scheduled Meds:  bictegravir-emtricitabine-tenofovir AF  1 tablet Oral Daily   valACYclovir  500 mg Oral Daily   Continuous Infusions:  penicillin G potassium 12 Million Units in dextrose 5 % 500 mL CONTINUOUS infusion 41.7 mL/hr at 03/30/23 0716     LOS: 3 days   Lonia Blood, MD [facility] Office  [phone] Pager - Text Page per Loretha Stapler  If 7PM-7AM, please contact night-coverage per Amion 03/30/2023, 10:14 AM

## 2023-03-30 NOTE — Progress Notes (Signed)
Peripherally Inserted Central Catheter Placement  The IV Nurse has discussed with the patient and/or persons authorized to consent for the patient, the purpose of this procedure and the potential benefits and risks involved with this procedure.  The benefits include less needle sticks, lab draws from the catheter, and the patient may be discharged home with the catheter. Risks include, but not limited to, infection, bleeding, blood clot (thrombus formation), and puncture of an artery; nerve damage and irregular heartbeat and possibility to perform a PICC exchange if needed/ordered by physician.  Alternatives to this procedure were also discussed.  Bard Power PICC patient education guide, fact sheet on infection prevention and patient information card has been provided to patient /or left at bedside.    PICC Placement Documentation  PICC Single Lumen 03/30/23 Right Basilic 39 cm 0 cm (Active)  Indication for Insertion or Continuance of Line Home intravenous therapies (PICC only) 03/30/23 1800  Exposed Catheter (cm) 0 cm 03/30/23 1800  Site Assessment Clean, Dry, Intact 03/30/23 1800  Line Status Flushed;Saline locked;Blood return noted 03/30/23 1800  Dressing Type Transparent;Securing device 03/30/23 1800  Dressing Status Antimicrobial disc in place;Clean, Dry, Intact 03/30/23 1800  Safety Lock Not Applicable 03/30/23 1800  Line Care Connections checked and tightened 03/30/23 1800  Line Adjustment (NICU/IV Team Only) No 03/30/23 1800  Dressing Intervention New dressing 03/30/23 1800  Dressing Change Due 04/06/23 03/30/23 1800       Timmothy Sours 03/30/2023, 6:37 PM

## 2023-03-30 NOTE — Progress Notes (Signed)
PHARMACY CONSULT NOTE FOR:  OUTPATIENT  PARENTERAL ANTIBIOTIC THERAPY (OPAT)  Indication: Neurosyphilis Regimen: Penicillin G 24 million units daily as a continuous infusion End date: 04/09/23  IV antibiotic discharge orders are pended. To discharging provider:  please sign these orders via discharge navigator,  Select New Orders & click on the button choice - Manage This Unsigned Work.     Thank you for allowing pharmacy to be a part of this patient's care.  Georgina Pillion, PharmD, BCPS, BCIDP Infectious Diseases Clinical Pharmacist 03/31/2023 7:39 AM   **Pharmacist phone directory can now be found on [email or web address] ([email or web address]).  Listed under [facility].

## 2023-03-31 DIAGNOSIS — J189 Pneumonia, unspecified organism: Secondary | ICD-10-CM

## 2023-03-31 DIAGNOSIS — B182 Chronic viral hepatitis C: Secondary | ICD-10-CM | POA: Diagnosis not present

## 2023-03-31 DIAGNOSIS — A528 Late syphilis, latent: Secondary | ICD-10-CM

## 2023-03-31 DIAGNOSIS — B20 Human immunodeficiency virus [HIV] disease: Secondary | ICD-10-CM | POA: Diagnosis not present

## 2023-03-31 DIAGNOSIS — R21 Rash and other nonspecific skin eruption: Secondary | ICD-10-CM

## 2023-03-31 DIAGNOSIS — A539 Syphilis, unspecified: Secondary | ICD-10-CM | POA: Diagnosis not present

## 2023-03-31 MED ORDER — PENICILLIN G POTASSIUM IV (FOR PTA / DISCHARGE USE ONLY)
24.0000 10*6.[IU] | INTRAVENOUS | 0 refills | Status: AC
Start: 2023-03-31 — End: 2023-04-09

## 2023-03-31 NOTE — Progress Notes (Addendum)
    [facility]    Date of Admission:  03/26/2023   Total days of antibiotics 5   ID: Pedro Owens is a 32 y.o. male with neurosyphilis Principal Problem:   Syphilis Active Problems:   HIV disease (HCC)   Chronic hepatitis C without hepatic coma (HCC)    Subjective: Headache improve. Picc line placed yesterday doing well.  Medications:   bictegravir-emtricitabine-tenofovir AF  1 tablet Oral Daily   Chlorhexidine Gluconate Cloth  6 each Topical Daily   sodium chloride flush  10-40 mL Intracatheter Q12H   valACYclovir  500 mg Oral Daily    Objective: Vital signs in last 24 hours: Temp:  [97.5 F (36.4 C)-98.8 F (37.1 C)] 97.5 F (36.4 C) (06/12 0734) Pulse Rate:  [55-59] 59 (06/12 0734) Resp:  [18] 18 (06/12 0501) BP: (118-131)/(71-82) 118/82 (06/12 0734) SpO2:  [99 %-100 %] 99 % (06/12 0734)  Physical Exam  Constitutional: He is oriented to person, place, and time. He appears well-developed and well-nourished. No distress.  HENT:  Mouth/Throat: Oropharynx is clear and moist. No oropharyngeal exudate.  Cardiovascular: Normal rate, regular rhythm and normal heart sounds. Exam reveals no gallop and no friction rub.  No murmur heard.  Pulmonary/Chest: Effort normal and breath sounds normal. No respiratory distress. He has no wheezes.  Abdominal: Soft. Bowel sounds are normal. He exhibits no distension. There is no tenderness.  Lymphadenopathy:  He has no cervical adenopathy.  Neurological: He is alert and oriented to person, place, and time.  Skin: Skin is warm and dry. No rash noted. No erythema.  Psychiatric: He has a normal mood and affect. His behavior is normal.    Lab Results No results for input(s): "WBC", "HGB", "HCT", "NA", "K", "CL", "CO2", "BUN", "CREATININE", "GLU" in the last 72 hours.  Invalid input(s): "PLATELETS" Liver Panel No results for input(s): "PROT", "ALBUMIN", "AST", "ALT", "ALKPHOS", "BILITOT", "BILIDIR", "IBILI"  in the last 72 hours. Sedimentation Rate No results for input(s): "ESRSEDRATE" in the last 72 hours. C-Reactive Protein No results for input(s): "CRP" in the last 72 hours.  Microbiology: Vdrl negative Studies/Results: [place] EKG SITE RITE  Result Date: 03/30/2023 If Site Rite image not attached, placement could not be confirmed due to current cardiac rhythm.    Assessment/Plan: Neurosyphilis = currently day 5 of 14 of IV penicillin. Has picc line. Awaiting to hear if insurance approves home health so that he can be discharged  HIV disease = VL down to 90 viral copies. 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 Await to see when he'll be approved for home health services through [facility].  [facility] for Infectious Diseases Pager: [phone]  03/31/2023, 3:03 PM

## 2023-03-31 NOTE — Progress Notes (Signed)
  Progress Note   Patient: Pedro Owens OZH:[id number] DOB: 1991-09-09 DOA: 03/26/2023     4 DOS: the patient was seen and examined on 03/31/2023   Brief hospital course: Pedro Owens is a 32 y.o. adult with medical history significant for HIV, chronic hepatitis C, depression, medical nonadherence who is admitted per infectious disease recommendation for workup of possible neurosyphilis.  Infectious disease consult pending.  Assessment and Plan: Suspected neurosyphilis RPR titer 1:1024 03/03/23 - care per ID with IV penicillin being dosed - LP completed in IR 6/9 with VDRL pending - CT head unrevealing - ID does now feel he is an appropriate candidate for PICC/outpt IV abx so PICC order has been placed - plan is for 2 weeks of IV PCN, followed by one dose of IM PCN -OPAT completed -PICC placed. Pending insurance authorization   Post LP HA Waxing and waning - follow - may require blood patch if persists    HIV Most recent CD4 298 with viral load of 388K 03/03/23 - continue Biktarvy as per ID   Genital lesions Continue Valtrex, though ID suspects these are due to syphilis   Chronic hepatitis C HCVRNA 15.3 million 03/03/2023 -was reportedly prescribed Harvoni previously but never took it   HSV prophylaxis Continue Valtrex   Chronic transaminitis Due to above -monitor intermittently   Subjective: Without complaints. Eager to go home  Physical Exam: Vitals:   03/30/23 1445 03/30/23 1951 03/31/23 0501 03/31/23 0734  BP: 130/78 131/81 119/71 118/82  Pulse: (!) 56 (!) 58 (!) 55 (!) 59  Resp: 15 18 18    Temp:  98.8 F (37.1 C) 98.6 F (37 C) (!) 97.5 F (36.4 C)  TempSrc:  Oral Oral Oral  SpO2: 99% 100% 99% 99%  Weight:      Height:       General exam: Awake, laying in bed, in nad Respiratory system: Normal respiratory effort, no wheezing Cardiovascular system: regular rate, s1, s2 Gastrointestinal system: Soft, nondistended, positive BS Central nervous system: CN2-12 grossly  intact, strength intact Extremities: Perfused, no clubbing Skin: Normal skin turgor, no notable skin lesions seen Psychiatry: Mood normal // no visual hallucinations   Data Reviewed:  There are no new results to review at this time.  Family Communication: Pt in room, family not at bedside  Disposition: Status is: Inpatient Remains inpatient appropriate because: Severity of illness  Planned Discharge Destination: Home    Author: Rickey Barbara, MD 03/31/2023 6:37 PM  For on call review [email or web address].

## 2023-03-31 NOTE — TOC Progression Note (Addendum)
Transition of Care [facility]) - Progression Note    Patient Details  Name: Pedro Owens MRN: [id number] Date of Birth: 1991-05-17  [facility] CM/SW Contact  Harriet Masson, RN Phone Number: 03/31/2023, 12:22 PM  Clinical Narrative:    Elita Quick with [employer] is following patient for home antibiotic needs.   [address] has completed teaching and waiting on insurance approval.     Expected Discharge Plan and Services                                               Social Determinants of Health (SDOH) Interventions SDOH Screenings   Food Insecurity: No Food Insecurity (03/26/2023)  Housing: Low Risk  (03/26/2023)  Transportation Needs: No Transportation Needs (03/26/2023)  Utilities: Not At Risk (03/26/2023)  Alcohol Screen: Low Risk  (10/04/2018)  Depression (PHQ2-9): Low Risk  (02/12/2022)  Tobacco Use: High Risk (03/26/2023)    Readmission Risk Interventions     No data to display

## 2023-04-01 DIAGNOSIS — R21 Rash and other nonspecific skin eruption: Secondary | ICD-10-CM | POA: Diagnosis not present

## 2023-04-01 DIAGNOSIS — A528 Late syphilis, latent: Secondary | ICD-10-CM | POA: Diagnosis not present

## 2023-04-01 DIAGNOSIS — A539 Syphilis, unspecified: Secondary | ICD-10-CM | POA: Diagnosis not present

## 2023-04-01 DIAGNOSIS — J189 Pneumonia, unspecified organism: Secondary | ICD-10-CM | POA: Diagnosis not present

## 2023-04-01 LAB — CSF CULTURE W GRAM STAIN

## 2023-04-01 NOTE — TOC Transition Note (Signed)
Transition of Care [facility]) - CM/SW Discharge Note   Patient Details  Name: GRADIE OHM MRN: [id number] Date of Birth: 1991/02/05  [facility]) CM/SW Contact:  Harriet Masson, RN Phone Number: 04/01/2023, 4:24 PM   Clinical Narrative:     Patient stable for discharge.  Home health RN from [facility]. Pam has taught mother and patient and will connect his antibiotic prior to discharge today.  No other TOC needs.   Final next level of care: Home w Home Health Services Barriers to Discharge: Barriers Resolved   Patient Goals and CMS Choice CMS Medicare.gov Compare Post Acute Care list provided to:: Patient Choice offered to / list presented to : Patient  Discharge Placement    home                     Discharge Plan and Services Additional resources added to the After Visit Summary for                            [facility] Arranged: RN [facility] Agency: [facility] Date HH Agency Contacted: 03/31/23 Time HH Agency Contacted: [phone] Representative spoke with at [facility] Agency: Pam  Social Determinants of Health (SDOH) Interventions SDOH Screenings   Food Insecurity: No Food Insecurity (03/26/2023)  Housing: Low Risk  (03/26/2023)  Transportation Needs: No Transportation Needs (03/26/2023)  Utilities: Not At Risk (03/26/2023)  Alcohol Screen: Low Risk  (10/04/2018)  Depression (PHQ2-9): Low Risk  (02/12/2022)  Tobacco Use: High Risk (03/26/2023)     Readmission Risk Interventions    04/01/2023    4:22 PM  Readmission Risk Prevention Plan  Post Dischage Appt Complete  Medication Screening Complete  Transportation Screening Complete

## 2023-04-01 NOTE — Discharge Summary (Signed)
Physician Discharge Summary   Patient: Pedro Owens MRN: [id number] DOB: [birth year]  Admit date:     03/26/2023  Discharge date: 04/01/23  Discharge Physician: Rickey Barbara   PCP: Diamantina Providence, FNP   Recommendations at discharge:    Follow up with PCP in 1-2 weeks Routine PICC care. Please d/c PICC upon completion of IV antibiotic on 04/09/23  Discharge Diagnoses: Principal Problem:   Syphilis Active Problems:   HIV disease (HCC)   Chronic hepatitis C without hepatic coma (HCC)  Resolved Problems:   * No resolved hospital problems. *  Hospital Course: Pedro Owens is a 32 y.o. adult with medical history significant for HIV, chronic hepatitis C, depression, medical nonadherence who is admitted per infectious disease recommendation for workup of possible neurosyphilis.  Infectious disease consult pending.  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 IV PCN through 04/09/23 -PICC placed   Post LP HA Improved   HIV Most recent CD4 298 with viral load of 388K 03/03/23 - continue Biktarvy as per ID   Genital lesions Continue Valtrex, though ID suspects these are due to syphilis   Chronic hepatitis C HCVRNA 15.3 million 03/03/2023 -was reportedly prescribed Harvoni previously but never took it   HSV prophylaxis Continue Valtrex   Chronic transaminitis Due to above -monitor intermittently       Consultants: ID Procedures performed:   Disposition: Home Diet recommendation:  Regular diet DISCHARGE MEDICATION: Allergies as of 04/01/2023       Reactions   Acetaminophen Diarrhea, Nausea And Vomiting, Other (See Comments)   Flares up crohns disease    Other Other (See Comments)   IV CONTRAST   Vicodin [hydrocodone-acetaminophen] Nausea And Vomiting   Dilaudid [hydromorphone Hcl] Hives, Rash        Medication List     TAKE these medications    bictegravir-emtricitabine-tenofovir AF 50-200-25 MG Tabs tablet Commonly known as: BIKTARVY Take 1 tablet by mouth daily.   FLUoxetine 10 MG capsule Commonly known as: PROZAC Take 1 capsule (10 mg total) by mouth daily.   omega-3 acid ethyl esters 1 g capsule Commonly known as: LOVAZA Take 1 capsule (1 g total) by mouth 2 (two) times daily.   penicillin G  IVPB Inject 24 Million Units into the vein daily for 9 days. Indication:  Neurosyphilis First Dose: Yes Last Day of Therapy:  04/09/23 Labs - Once weekly:  CBC/D and BMP, Labs - Once weekly: ESR and CRP Method of administration: Elastomeric (Continuous infusion) Pull PICC line at the completion of IV antibiotic therapy Method of administration may be changed at the discretion of home infusion pharmacist based upon assessment of the patient and/or caregiver's ability to self-administer the medication ordered.   valACYclovir 500 MG tablet Commonly known as: Valtrex Take 1 tablet (500 mg total) by mouth daily.               Discharge Care Instructions  (From admission, onward)           Start     Ordered   03/31/23 0000  Change dressing on IV access line weekly and PRN  (Home infusion  instructions - [facility] )        03/31/23 1435            Follow-up Information     Diamantina Providence, FNP Follow up in 2 week(s).   Specialty: Nurse Practitioner Why: Hospital follow up Contact information: [address] Ginette [place] [address] [phone]                Discharge Exam: Filed Weights   03/26/23 1633  Weight: 72.6 kg   General exam: Awake, laying in bed, in nad Respiratory system: Normal respiratory effort, no wheezing Cardiovascular system: regular rate, s1,  s2 Gastrointestinal system: Soft, nondistended, positive BS Central nervous system: CN2-12 grossly intact, strength intact Extremities: Perfused, no clubbing Skin: Normal skin turgor, no notable skin lesions seen Psychiatry: Mood normal // no visual hallucinations   Condition at discharge: fair  The results of significant diagnostics from this hospitalization (including imaging, microbiology, ancillary and laboratory) are listed below for reference.   Imaging Studies: [place] EKG SITE RITE  Result Date: 03/30/2023 If Site Rite image not attached, placement could not be confirmed due to current cardiac rhythm.  DG FL GUIDED LUMBAR PUNCTURE  Result Date: 03/28/2023 CLINICAL DATA:  History of HIV. Vision changes. Request for diagnostic lumbar puncture EXAM: DIAGNOSTIC LUMBAR PUNCTURE UNDER FLUOROSCOPIC GUIDANCE FLUOROSCOPY TIME:  Radiation Exposure Index (as provided by the fluoroscopic device): 2.80 mGy Number of Acquired Images:  2 PROCEDURE: Informed consent was obtained from the patient prior to the procedure, including potential complications of headache, allergy, and pain. With the patient prone, the lower back was prepped with Betadine. 1% Lidocaine was used for local anesthesia. Lumbar puncture was performed at the L3-L4 level using a 20 gauge needle with return of cloudy, dark pink CSF with an opening pressure of approximately 9 cm water. Approximately 10.5 ml of CSF were obtained for laboratory studies. The patient tolerated the procedure well and there were no apparent complications. IMPRESSION: Technically successful lumbar puncture from the L3-L4 level without complication. Procedure performed by Brayton El PA-C supervised by Dr. Kennith Center Electronically Signed   By: Kennith Center M.D.   On: 03/28/2023 11:50   CT HEAD WO CONTRAST ( )  Result Date: 03/27/2023 CLINICAL DATA:  Initial evaluation for acute headache, immunocompromised. EXAM: CT HEAD WITHOUT CONTRAST TECHNIQUE: Contiguous  axial images were obtained from the base of the skull through the vertex without intravenous contrast. RADIATION DOSE REDUCTION: This exam was performed according to the departmental dose-optimization program which includes automated exposure control, adjustment of the mA and/or kV according to patient size and/or use of iterative reconstruction technique. COMPARISON:  Prior brain MRI from 03/19/2016. FINDINGS: Brain: Cerebral volume within normal limits for patient age. No evidence for acute intracranial hemorrhage. No findings to suggest acute large vessel territory infarct. No mass lesion, midline shift, or mass effect. Ventricles are normal in size without evidence for hydrocephalus. No extra-axial fluid collection identified. Vascular: No hyperdense vessel identified. Skull: Scalp soft tissues demonstrate no acute abnormality. Calvarium intact. Sinuses/Orbits: Globes and orbital soft tissues within normal limits. Mild mucosal thickening present about the left frontoethmoidal recess. Visualized paranasal sinuses are otherwise largely clear. No mastoid effusion. IMPRESSION: Normal head CT. No acute intracranial abnormality identified. Electronically Signed   By: Rise Mu M.D.   On: 03/27/2023 18:26    Microbiology: Results for orders placed or performed during the hospital encounter of 03/26/23  CSF culture w Gram Stain  Status: None   Collection Time: 03/28/23 10:29 AM   Specimen: CSF; Cerebrospinal Fluid  Result Value Ref Range Status   Specimen Description CSF  Final   Special Requests Immunocompromised  Final   Gram Stain   Final    WBC PRESENT,BOTH PMN AND MONONUCLEAR NO ORGANISMS SEEN CYTOSPIN SMEAR    Culture   Final    NO GROWTH 3 DAYS Performed at [facility], [address] [place], [place] [address]    Report Status 04/01/2023 FINAL  Final    Labs: CBC: Recent Labs  Lab 03/26/23 1333 03/27/23 0443  WBC 7.1 6.4  HGB 15.7 15.3  HCT 46.1 43.6  MCV 84.0  83.8  PLT 429* 369   Basic Metabolic Panel: Recent Labs  Lab 03/26/23 1333 03/27/23 0443 03/28/23 0219  NA 135 135 135  K 3.9 3.9 4.1  CL 104 104 106  CO2 22 21* 21*  GLUCOSE 78 95 121*  BUN 17 15 16   CREATININE 1.06 0.90 0.94  CALCIUM 9.2 8.8* 8.7*   Liver Function Tests: Recent Labs  Lab 03/27/23 0443 03/28/23 0219  AST 64* 56*  ALT 65* 59*  ALKPHOS 118 163*  BILITOT 0.5 0.3  PROT 7.5 7.2  ALBUMIN 3.2* 3.1*   CBG: No results for input(s): "GLUCAP" in the last 168 hours.  Discharge time spent: less than 30 minutes.  Signed: Rickey Barbara, MD [facility] 04/01/2023

## 2023-04-01 NOTE — Progress Notes (Signed)
Educated pt on AVS. No questions at this time. Belongings at bedside. Home IV nurse attached pt to IV abx through PICC line for home infusion.

## 2023-04-02 ENCOUNTER — Telehealth: Payer: Self-pay

## 2023-04-02 NOTE — Telephone Encounter (Signed)
Per Dr. Drue Second they can take high dose ibuprofen ( 600-800mg  -- so 3 to 4 tabs of the otc 200mg  ibuprofen) I think it is related to his LP and should get better. Relayed this information to patient mother. Will call office Monday if headaches do not improve. Juanita Laster, RMA

## 2023-04-02 NOTE — Telephone Encounter (Signed)
Patient's mother called office requesting refills on headache medicine. Does not remember the name of it, but remebered it started with a S. Headache started after discharge.  Is taking tylenol, but is not having relief. Would like for this to go to [facility] on [address].  Does not have a PCP at this time. Is trying to find a PCP to establish care. Juanita Laster, RMA

## 2023-04-13 ENCOUNTER — Other Ambulatory Visit: Payer: Self-pay

## 2023-04-13 ENCOUNTER — Encounter: Payer: Self-pay | Admitting: Internal Medicine

## 2023-04-13 ENCOUNTER — Ambulatory Visit: Payer: Medicaid Other | Admitting: Internal Medicine

## 2023-04-13 VITALS — BP 130/83 | HR 81 | Ht 71.0 in | Wt 160.0 lb

## 2023-04-13 DIAGNOSIS — Z23 Encounter for immunization: Secondary | ICD-10-CM | POA: Diagnosis not present

## 2023-04-13 DIAGNOSIS — B20 Human immunodeficiency virus [HIV] disease: Secondary | ICD-10-CM | POA: Diagnosis not present

## 2023-04-13 DIAGNOSIS — A539 Syphilis, unspecified: Secondary | ICD-10-CM

## 2023-04-13 MED ORDER — PENICILLIN G BENZATHINE 1200000 UNIT/2ML IM SUSY
2.4000 10*6.[IU] | PREFILLED_SYRINGE | Freq: Once | INTRAMUSCULAR | Status: AC
Start: 2023-04-13 — End: 2023-04-13
  Administered 2023-04-13: 2.4 10*6.[IU] via INTRAMUSCULAR

## 2023-04-13 NOTE — Progress Notes (Signed)
Patient ID: Pedro Owens, male   DOB: January 22, 1991, 32 y.o.   MRN: [id number]  HPI Doing well since hospitalization. Not missing doses of biktarvy 14 day course of penicillin iv for neurosyphilis. Here for first of 3 doses of IM PCN Outpatient Encounter Medications as of 04/13/2023  Medication Sig   bictegravir-emtricitabine-tenofovir AF (BIKTARVY) 50-200-25 MG TABS tablet Take 1 tablet by mouth daily.   omega-3 acid ethyl esters (LOVAZA) 1 g capsule Take 1 capsule (1 g total) by mouth 2 (two) times daily.   valACYclovir (VALTREX) 500 MG tablet Take 1 tablet (500 mg total) by mouth daily.   [DISCONTINUED] divalproex (DEPAKOTE) 500 MG DR tablet Take 1 tablet (500 mg total) by mouth 2 (two) times daily.   [DISCONTINUED] FLUoxetine (PROZAC) 10 MG capsule Take 1 capsule (10 mg total) by mouth daily. (Patient not taking: Reported on 10/11/2019)   No facility-administered encounter medications on file as of 04/13/2023.     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   Genital herpes 12/14/2019   Severe recurrent major depression w/psychotic features, mood-congruent (HCC) 10/04/2018   Transgender    Pleural effusion, bacterial 11/23/2017   Loculated pleural effusion 11/23/2017   Chronic hepatitis C without hepatic coma (HCC) 11/19/2017   Septic pulmonary embolism (HCC) 11/19/2017   IVDU (intravenous drug user)    Endocarditis, suspected 11/18/2017   HIV disease (HCC) 04/07/2016   Cigarette smoker 04/07/2016   Polysubstance abuse (HCC) 04/07/2016   Depression 08/14/2015   Conversion disorder 08/14/2015   Hemorrhoids, internal, with bleeding 08/25/2011   Anal warts 08/25/2011   ANXIETY 09/24/2010   ADHD 09/24/2010     Health Maintenance Due  Topic Date Due   COVID-19 Vaccine (1) Never done   DTaP/Tdap/Td (2 - Td or Tdap) 05/11/2018     Review of Systems Review of Systems   Constitutional: Negative for fever, chills, diaphoresis, activity change, appetite change, fatigue and unexpected weight change.  HENT: Negative for congestion, sore throat, rhinorrhea, sneezing, trouble swallowing and sinus pressure.  Eyes: Negative for photophobia and visual disturbance.  Respiratory: Negative for cough, chest tightness, shortness of breath, wheezing and stridor.  Cardiovascular: Negative for chest pain, palpitations and leg swelling.  Gastrointestinal: Negative for nausea, vomiting, abdominal pain, diarrhea, constipation, blood in stool, abdominal distention and anal bleeding.  Genitourinary: Negative for dysuria, hematuria, flank pain and difficulty urinating.  Musculoskeletal: Negative for myalgias, back pain, joint swelling, arthralgias and gait problem.  Skin: Negative for color change, pallor, rash and wound.  Neurological: Negative for dizziness, tremors, weakness and light-headedness.  Hematological: Negative for adenopathy. Does not bruise/bleed easily.  Psychiatric/Behavioral: Negative for behavioral problems, confusion, sleep disturbance, dysphoric mood, decreased concentration and agitation.    Physical Exam   BP 130/83   Pulse 81   Ht 5\' 11"  (1.803 m)   Wt 160 lb (72.6 kg)   BMI 22.32 kg/m   Physical Exam  Constitutional: He is oriented to person, place, and time. He appears well-developed and well-nourished. No distress.  HENT:  Mouth/Throat: Oropharynx is clear and moist. No oropharyngeal exudate.  Cardiovascular: Normal rate, regular rhythm and normal heart sounds. Exam reveals no gallop and no friction rub.  No murmur heard.  Pulmonary/Chest: Effort normal and breath sounds normal. No respiratory distress. He has no wheezes.  Abdominal: Soft. Bowel sounds are normal. He exhibits no distension. There is no tenderness.  Lymphadenopathy:  He has no cervical adenopathy.  Neurological: He is alert and oriented to person, place, and time.  Skin: Skin  is warm and dry. No rash noted. No erythema.  Psychiatric: He has a normal mood and affect. His behavior is normal.   Lab Results  Component Value Date   CD4TCELL 28 (L) 03/03/2023   Lab Results  Component Value Date   CD4TABS 298 (L) 03/03/2023   CD4TABS 521 08/19/2022   CD4TABS 578 03/12/2021   Lab Results  Component Value Date   HIV1RNAQUANT 90 03/29/2023   Lab Results  Component Value Date   HEPBSAB NEG 03/26/2016   Lab Results  Component Value Date   LABRPR REACTIVE (A) 03/03/2023    CBC Lab Results  Component Value Date   WBC 6.4 03/27/2023   RBC 5.20 03/27/2023   HGB 15.3 03/27/2023   HCT 43.6 03/27/2023   PLT 369 03/27/2023   MCV 83.8 03/27/2023   MCH 29.4 03/27/2023   MCHC 35.1 03/27/2023   RDW 13.7 03/27/2023   LYMPHSABS 2.4 08/20/2022   MONOABS 0.9 08/20/2022   EOSABS 0.4 08/20/2022    BMET Lab Results  Component Value Date   NA 135 03/28/2023   K 4.1 03/28/2023   CL 106 03/28/2023   CO2 21 (L) 03/28/2023   GLUCOSE 121 (H) 03/28/2023   BUN 16 03/28/2023   CREATININE 0.94 03/28/2023   CALCIUM 8.7 (L) 03/28/2023   GFRNONAA >60 03/28/2023   GFRAA 115 03/12/2021      Assessment and Plan Hiv disease= continue on biktarvy, adherence counseling  Health maintenance = will start Hep b vaccine  Neurosyphilis and latent syphilis =  Bicillin IM today  Rtc in 2 wk then will check hiv vl and discuss hep c treatment

## 2023-04-19 ENCOUNTER — Telehealth: Payer: Self-pay

## 2023-04-19 NOTE — Telephone Encounter (Signed)
Patient's mother called office requesting to reschedule tomorrow appointment for Bicillin. Informed her that we are not able to push tomorrow's appointment to next week or we would have to restart syphillis treatment. Patient is working in [employer] and wont get off until 430. Requested they keep tomorrow's appointment and office would provide work note. Will call office if not able to keep appointment. If needed patient can go to Urgent Care or Health Department for second injection of bicillin.  Juanita Laster, RMA

## 2023-04-20 ENCOUNTER — Ambulatory Visit (INDEPENDENT_AMBULATORY_CARE_PROVIDER_SITE_OTHER): Payer: Medicaid Other

## 2023-04-20 ENCOUNTER — Other Ambulatory Visit: Payer: Self-pay

## 2023-04-20 DIAGNOSIS — A539 Syphilis, unspecified: Secondary | ICD-10-CM | POA: Diagnosis present

## 2023-04-20 MED ORDER — PENICILLIN G BENZATHINE 1200000 UNIT/2ML IM SUSY
2.4000 10*6.[IU] | PREFILLED_SYRINGE | Freq: Once | INTRAMUSCULAR | Status: AC
Start: 2023-04-20 — End: 2023-04-20
  Administered 2023-04-20: 2.4 10*6.[IU] via INTRAMUSCULAR

## 2023-04-27 ENCOUNTER — Other Ambulatory Visit: Payer: Self-pay

## 2023-04-27 ENCOUNTER — Ambulatory Visit (INDEPENDENT_AMBULATORY_CARE_PROVIDER_SITE_OTHER): Payer: Medicaid Other

## 2023-04-27 DIAGNOSIS — A539 Syphilis, unspecified: Secondary | ICD-10-CM | POA: Diagnosis present

## 2023-04-27 MED ORDER — PENICILLIN G BENZATHINE 1200000 UNIT/2ML IM SUSY
1.2000 10*6.[IU] | PREFILLED_SYRINGE | Freq: Once | INTRAMUSCULAR | Status: AC
Start: 2023-04-27 — End: 2023-04-27
  Administered 2023-04-27: 1.2 10*6.[IU] via INTRAMUSCULAR

## 2023-04-27 NOTE — Progress Notes (Signed)
Nurse Visit  Pedro Owens 12-19-1990   Allergies  Allergen Reactions   Acetaminophen Diarrhea, Nausea And Vomiting and Other (See Comments)    Flares up crohns disease    Other Other (See Comments)    IV CONTRAST    Vicodin [Hydrocodone-Acetaminophen] Nausea And Vomiting   Dilaudid [Hydromorphone Hcl] Hives and Rash    Reviewed allergies with patient.  Medications administered: Bicillin 2.4 million units IM Injections chaperoned by Guadelupe Sabin, CMA.   Patient tolerated well.    Sandie Ano, RN

## 2023-05-06 ENCOUNTER — Other Ambulatory Visit: Payer: Self-pay

## 2023-05-06 ENCOUNTER — Telehealth: Payer: Self-pay | Admitting: Pharmacist

## 2023-05-06 ENCOUNTER — Encounter: Payer: Self-pay | Admitting: Internal Medicine

## 2023-05-06 ENCOUNTER — Ambulatory Visit: Payer: Medicaid Other | Admitting: Internal Medicine

## 2023-05-06 VITALS — BP 123/82 | HR 68 | Temp 98.3°F | Resp 16 | Wt 165.0 lb

## 2023-05-06 DIAGNOSIS — Z23 Encounter for immunization: Secondary | ICD-10-CM | POA: Diagnosis not present

## 2023-05-06 DIAGNOSIS — B20 Human immunodeficiency virus [HIV] disease: Secondary | ICD-10-CM | POA: Diagnosis present

## 2023-05-06 DIAGNOSIS — B182 Chronic viral hepatitis C: Secondary | ICD-10-CM | POA: Diagnosis not present

## 2023-05-06 MED ORDER — ONDANSETRON HCL 4 MG PO TABS: 4.0000 mg | ORAL_TABLET | Freq: Three times a day (TID) | ORAL | 3 refills | Status: DC | PRN

## 2023-05-06 NOTE — Telephone Encounter (Signed)
Patient interested in starting [place] as taking oral medication makes him feel "nasty" sometimes. His adherence has improved over the last couple months, and Dr. Drue Second would like to see him again in ~3 months with another viral load check before starting the injections. He has been inconsistent with his adherence in years past due to various issues including stigma and insurance barriers. Will check [employer] today; prior genotypes do not demonstrate any drug resistance. Jess Barters signed [place] contract today.  Counseled that [place] is two separate intramuscular injections in the gluteal muscle on each side for each visit. Explained that the second injection is 30 days after the initial injection then every 2 months thereafter. Discussed the need for viral load monitoring every 2 months for the first 6 months and then periodically afterwards as their provider sees the need. Discussed the rare but significant chance of developing resistance despite compliance. Explained that showing up to injection appointments is very important and warned that if 2 appointments are missed, it will be reassessed by their provider whether they are a good candidate for injection therapy. Counseled on possible side effects associated with the injections such as injection site pain, which is usually mild to moderate in nature, injection site nodules, and injection site reactions. Asked to call the clinic or send me a mychart message if they experience any issues, such as fatigue, nausea, headache, rash, or dizziness. Advised that they can take ibuprofen or tylenol for injection site pain if needed.   Margarite Gouge, PharmD, CPP, BCIDP, AAHIVP Clinical Pharmacist Practitioner Infectious Diseases Clinical Pharmacist [facility]

## 2023-05-06 NOTE — Progress Notes (Signed)
RFV: follow up for HIV disease  Patient ID: Pedro Owens, male   DOB: 03-28-1991, 32 y.o.   MRN: [id number]  HPI Pedro Owens is a 32yo M on biktarvy. Gets some nausea on empty stomach. Interested in getting cabenuva. He reports doing well with adherence. He finished weekly injection for latent syphilis. He is  Also here to get work up for hep c and  treatment  Soc hx: not sexually active. Working fulltime at [employer] Encounter Medications as of 05/06/2023  Medication Sig   bictegravir-emtricitabine-tenofovir AF (BIKTARVY) 50-200-25 MG TABS tablet Take 1 tablet by mouth daily.   valACYclovir (VALTREX) 500 MG tablet Take 1 tablet (500 mg total) by mouth daily.   [DISCONTINUED] divalproex (DEPAKOTE) 500 MG DR tablet Take 1 tablet (500 mg total) by mouth 2 (two) times daily.   No facility-administered encounter medications on file as of 05/06/2023.     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   Genital herpes 12/14/2019   Severe recurrent major depression w/psychotic features, mood-congruent (HCC) 10/04/2018   Transgender    Pleural effusion, bacterial 11/23/2017   Loculated pleural effusion 11/23/2017   Chronic hepatitis C without hepatic coma (HCC) 11/19/2017   Septic pulmonary embolism (HCC) 11/19/2017   IVDU (intravenous drug user)    Endocarditis, suspected 11/18/2017   HIV disease (HCC) 04/07/2016   Cigarette smoker 04/07/2016   Polysubstance abuse (HCC) 04/07/2016   Depression 08/14/2015   Conversion disorder 08/14/2015   Hemorrhoids, internal, with bleeding 08/25/2011   Anal warts 08/25/2011   ANXIETY 09/24/2010   ADHD 09/24/2010     Health Maintenance Due  Topic Date Due   COVID-19 Vaccine (1) Never done   DTaP/Tdap/Td (2 - Td or Tdap) 05/11/2018     Review of Systems Review of Systems  Constitutional: Negative for fever, chills, diaphoresis,  activity change, appetite change, fatigue and unexpected weight change.  HENT: Negative for congestion, sore throat, rhinorrhea, sneezing, trouble swallowing and sinus pressure.  Eyes: Negative for photophobia and visual disturbance.  Respiratory: Negative for cough, chest tightness, shortness of breath, wheezing and stridor.  Cardiovascular: Negative for chest pain, palpitations and leg swelling.  Gastrointestinal: Negative for nausea, vomiting, abdominal pain, diarrhea, constipation, blood in stool, abdominal distention and anal bleeding.  Genitourinary: Negative for dysuria, hematuria, flank pain and difficulty urinating.  Musculoskeletal: Negative for myalgias, back pain, joint swelling, arthralgias and gait problem.  Skin: Negative for color change, pallor, rash and wound.  Neurological: Negative for dizziness, tremors, weakness and light-headedness.  Hematological: Negative for adenopathy. Does not bruise/bleed easily.  Psychiatric/Behavioral: Negative for behavioral problems, confusion, sleep disturbance, dysphoric mood, decreased concentration and agitation.   Physical Exam   Wt 165 lb (74.8 kg)   BMI 23.01 kg/m   Physical Exam  Constitutional: He is oriented to person, place, and time. He appears well-developed and well-nourished. No distress.  HENT:  Mouth/Throat: Oropharynx is clear and moist. No oropharyngeal exudate.  Cardiovascular: Normal rate, regular rhythm and normal heart sounds. Exam reveals no gallop and no friction rub.  No murmur heard.  Pulmonary/Chest: Effort normal and breath sounds normal. No respiratory distress. He has no wheezes.  Abdominal: Soft. Bowel sounds are normal. He exhibits no distension. There is no tenderness.  Lymphadenopathy:  He has no cervical adenopathy.  Neurological: He is alert and oriented to person, place, and time.  Skin: Skin is warm and  dry. No rash noted. No erythema.  Psychiatric: He has a normal mood and affect. His behavior is  normal.   Lab Results  Component Value Date   CD4TCELL 28 (L) 03/03/2023   Lab Results  Component Value Date   CD4TABS 298 (L) 03/03/2023   CD4TABS 521 08/19/2022   CD4TABS 578 03/12/2021   Lab Results  Component Value Date   HIV1RNAQUANT 90 03/29/2023   Lab Results  Component Value Date   HEPBSAB NEG 03/26/2016   Lab Results  Component Value Date   LABRPR REACTIVE (A) 03/03/2023    CBC Lab Results  Component Value Date   WBC 6.4 03/27/2023   RBC 5.20 03/27/2023   HGB 15.3 03/27/2023   HCT 43.6 03/27/2023   PLT 369 03/27/2023   MCV 83.8 03/27/2023   MCH 29.4 03/27/2023   MCHC 35.1 03/27/2023   RDW 13.7 03/27/2023   LYMPHSABS 2.4 08/20/2022   MONOABS 0.9 08/20/2022   EOSABS 0.4 08/20/2022    BMET Lab Results  Component Value Date   NA 135 03/28/2023   K 4.1 03/28/2023   CL 106 03/28/2023   CO2 21 (L) 03/28/2023   GLUCOSE 121 (H) 03/28/2023   BUN 16 03/28/2023   CREATININE 0.94 03/28/2023   CALCIUM 8.7 (L) 03/28/2023   GFRNONAA >60 03/28/2023   GFRAA 115 03/12/2021      Assessment and Plan  Hiv disease = continue on biktarvy. Will ask him to check VL to see if he is now undetectable. Motivated to to getting to switch to cabenuva. Will also check [place] archive  Nausea = sent in zofran  Chronic hepatitis c without hepatic coma = will do work up nad start treatment. Eplusa  Hx of neurosyphilis = finished 14 day treatment as well as 3 weekly injection for latent syphilis. We will check rpr at next visit  See back in 2 months

## 2023-05-09 LAB — HIV-1 RNA QUANT-NO REFLEX-BLD
HIV 1 RNA Quant: 26 Copies/mL — ABNORMAL HIGH
HIV-1 RNA Quant, Log: 1.41 Log cps/mL — ABNORMAL HIGH

## 2023-05-11 LAB — HEPATITIS C GENOTYPE

## 2023-05-18 ENCOUNTER — Ambulatory Visit: Payer: Medicaid Other | Admitting: Podiatry

## 2023-05-21 ENCOUNTER — Other Ambulatory Visit: Payer: Medicaid Other

## 2023-05-28 ENCOUNTER — Other Ambulatory Visit: Payer: Medicaid Other

## 2023-06-01 NOTE — Telephone Encounter (Addendum)
Genosure archive came back with no resistance found. Pictures located in media tab. Will route to [facility] and Dr. Drue Second.   Breeanne Oblinger L. Jannette Fogo, PharmD, BCIDP, AAHIVP, CPP Clinical Pharmacist Practitioner Infectious Diseases Clinical Pharmacist [facility] 06/01/2023, 11:23 AM

## 2023-06-02 ENCOUNTER — Telehealth: Payer: Self-pay | Admitting: Pharmacist

## 2023-06-02 NOTE — Telephone Encounter (Signed)
Patient's genosure archive shows no resistance-associated mutations. Pictures located under media tab.   Pedro Owens L. Pedro Owens, PharmD, BCIDP, AAHIVP, CPP Clinical Pharmacist Practitioner Infectious Diseases Clinical Pharmacist [facility] 06/02/2023, 2:16 PM

## 2023-06-08 ENCOUNTER — Telehealth: Payer: Self-pay | Admitting: Pharmacist

## 2023-06-08 ENCOUNTER — Other Ambulatory Visit (HOSPITAL_COMMUNITY): Payer: Self-pay

## 2023-06-08 NOTE — Telephone Encounter (Signed)
Attempted to reach patient regarding [employer] results and counsel on and schedule Cabenuva injection. Patient did not answer, so left HIPAA compliant voice mail.   Lennie Muckle, PharmD PGY1 Pharmacy Resident 06/08/2023 10:34 AM

## 2023-06-08 NOTE — Telephone Encounter (Signed)
LVM with patient to discuss clear [employer]. Will await repeat HIV RNA results in September before scheduling first [place]. Marchelle Folks

## 2023-07-08 ENCOUNTER — Ambulatory Visit: Payer: Medicaid Other | Admitting: Internal Medicine

## 2023-07-30 ENCOUNTER — Telehealth: Payer: Self-pay

## 2023-07-30 NOTE — Telephone Encounter (Signed)
Received call from [facility] with DIS. She has been trying to locate Jaye to discuss control measures as he recently tried to donate plasma.   Furman is also die for follow up with [facility]. Myami talked with his mom who reports Eason may have moved.   Called Kjell, number is not in service. Will try to reach out via MyChart.   Sandie Ano, RN

## 2023-08-11 ENCOUNTER — Ambulatory Visit: Payer: Medicaid Other | Admitting: Internal Medicine

## 2023-08-11 ENCOUNTER — Telehealth: Payer: Self-pay

## 2023-08-11 NOTE — Telephone Encounter (Signed)
Called patient to reschedule missed appointment, no answer left voice message.

## 2023-09-14 ENCOUNTER — Other Ambulatory Visit (HOSPITAL_COMMUNITY): Payer: Self-pay

## 2023-09-20 ENCOUNTER — Ambulatory Visit: Payer: Medicaid Other | Admitting: Internal Medicine

## 2023-09-21 ENCOUNTER — Encounter (HOSPITAL_COMMUNITY): Payer: Self-pay

## 2023-09-21 ENCOUNTER — Inpatient Hospital Stay (HOSPITAL_COMMUNITY)
Admission: EM | Admit: 2023-09-21 | Discharge: 2023-09-25 | DRG: 603 | Disposition: A | Discharge status: Home / Self Care | Payer: Medicaid Other | Attending: Internal Medicine | Admitting: Internal Medicine

## 2023-09-21 ENCOUNTER — Emergency Department (HOSPITAL_COMMUNITY): Payer: Medicaid Other

## 2023-09-21 DIAGNOSIS — Z79899 Other long term (current) drug therapy: Secondary | ICD-10-CM

## 2023-09-21 DIAGNOSIS — L03211 Cellulitis of face: Secondary | ICD-10-CM | POA: Diagnosis not present

## 2023-09-21 DIAGNOSIS — J343 Hypertrophy of nasal turbinates: Secondary | ICD-10-CM | POA: Diagnosis present

## 2023-09-21 DIAGNOSIS — F17210 Nicotine dependence, cigarettes, uncomplicated: Secondary | ICD-10-CM | POA: Diagnosis present

## 2023-09-21 DIAGNOSIS — Z83438 Family history of other disorder of lipoprotein metabolism and other lipidemia: Secondary | ICD-10-CM | POA: Diagnosis not present

## 2023-09-21 DIAGNOSIS — Z91199 Patient's noncompliance with other medical treatment and regimen due to unspecified reason: Secondary | ICD-10-CM | POA: Diagnosis not present

## 2023-09-21 DIAGNOSIS — Z885 Allergy status to narcotic agent status: Secondary | ICD-10-CM | POA: Diagnosis not present

## 2023-09-21 DIAGNOSIS — J4489 Other specified chronic obstructive pulmonary disease: Secondary | ICD-10-CM | POA: Diagnosis present

## 2023-09-21 DIAGNOSIS — F909 Attention-deficit hyperactivity disorder, unspecified type: Secondary | ICD-10-CM | POA: Diagnosis present

## 2023-09-21 DIAGNOSIS — Z8249 Family history of ischemic heart disease and other diseases of the circulatory system: Secondary | ICD-10-CM

## 2023-09-21 DIAGNOSIS — F319 Bipolar disorder, unspecified: Secondary | ICD-10-CM | POA: Diagnosis present

## 2023-09-21 DIAGNOSIS — B182 Chronic viral hepatitis C: Secondary | ICD-10-CM | POA: Diagnosis present

## 2023-09-21 DIAGNOSIS — I10 Essential (primary) hypertension: Secondary | ICD-10-CM | POA: Diagnosis present

## 2023-09-21 DIAGNOSIS — Z91041 Radiographic dye allergy status: Secondary | ICD-10-CM | POA: Diagnosis not present

## 2023-09-21 DIAGNOSIS — Z8619 Personal history of other infectious and parasitic diseases: Secondary | ICD-10-CM

## 2023-09-21 DIAGNOSIS — L0201 Cutaneous abscess of face: Secondary | ICD-10-CM | POA: Diagnosis present

## 2023-09-21 DIAGNOSIS — G894 Chronic pain syndrome: Secondary | ICD-10-CM | POA: Diagnosis present

## 2023-09-21 DIAGNOSIS — Z8661 Personal history of infections of the central nervous system: Secondary | ICD-10-CM

## 2023-09-21 DIAGNOSIS — Z806 Family history of leukemia: Secondary | ICD-10-CM

## 2023-09-21 DIAGNOSIS — Z886 Allergy status to analgesic agent status: Secondary | ICD-10-CM | POA: Diagnosis not present

## 2023-09-21 DIAGNOSIS — K5090 Crohn's disease, unspecified, without complications: Secondary | ICD-10-CM | POA: Diagnosis present

## 2023-09-21 DIAGNOSIS — B20 Human immunodeficiency virus [HIV] disease: Secondary | ICD-10-CM | POA: Diagnosis present

## 2023-09-21 DIAGNOSIS — F1910 Other psychoactive substance abuse, uncomplicated: Secondary | ICD-10-CM | POA: Diagnosis present

## 2023-09-21 DIAGNOSIS — Z833 Family history of diabetes mellitus: Secondary | ICD-10-CM | POA: Diagnosis not present

## 2023-09-21 DIAGNOSIS — Z21 Asymptomatic human immunodeficiency virus [HIV] infection status: Secondary | ICD-10-CM | POA: Diagnosis present

## 2023-09-21 LAB — BASIC METABOLIC PANEL
Anion gap: 9 (ref 5–15)
BUN: 18 mg/dL (ref 6–20)
CO2: 24 mmol/L (ref 22–32)
Calcium: 9.3 mg/dL (ref 8.9–10.3)
Chloride: 103 mmol/L (ref 98–111)
Creatinine, Ser: 0.86 mg/dL (ref 0.61–1.24)
GFR, Estimated: >60 mL/min (ref >60)
Glucose, Bld: 86 mg/dL (ref 70–99)
Potassium: 4.7 mmol/L (ref 3.5–5.1)
Sodium: 136 mmol/L (ref 135–145)

## 2023-09-21 LAB — CBC
HCT: 53.2 % — ABNORMAL HIGH (ref 39.0–52.0)
Hemoglobin: 17.4 g/dL — ABNORMAL HIGH (ref 13.0–17.0)
MCH: 29.2 pg (ref 26.0–34.0)
MCHC: 32.7 g/dL (ref 30.0–36.0)
MCV: 89.4 fL (ref 80.0–100.0)
Platelets: 348 10*3/uL (ref 150–400)
RBC: 5.95 MIL/uL — ABNORMAL HIGH (ref 4.22–5.81)
RDW: 13.3 % (ref 11.5–15.5)
WBC: 10.9 10*3/uL — ABNORMAL HIGH (ref 4.0–10.5)
nRBC: 0 % (ref 0.0–0.2)

## 2023-09-21 MED ORDER — KETOROLAC TROMETHAMINE 30 MG/ML IJ SOLN
30.0000 mg | Freq: Four times a day (QID) | INTRAMUSCULAR | Status: AC
  Administered 2023-09-21 – 2023-09-22 (×4): 30 mg via INTRAVENOUS
  Filled 2023-09-21 (×4): qty 1

## 2023-09-21 MED ORDER — ENOXAPARIN SODIUM 40 MG/0.4ML IJ SOSY
40.0000 mg | PREFILLED_SYRINGE | INTRAMUSCULAR | Status: DC
  Administered 2023-09-21 – 2023-09-24 (×4): 40 mg via SUBCUTANEOUS
  Filled 2023-09-21 (×4): qty 0.4

## 2023-09-21 MED ORDER — KETOROLAC TROMETHAMINE 15 MG/ML IJ SOLN
15.0000 mg | Freq: Once | INTRAMUSCULAR | Status: AC
  Administered 2023-09-21: 15 mg via INTRAVENOUS
  Filled 2023-09-21: qty 1

## 2023-09-21 MED ORDER — BICTEGRAVIR-EMTRICITAB-TENOFOV 50-200-25 MG PO TABS
1.0000 | ORAL_TABLET | Freq: Every day | ORAL | Status: DC
Start: 2023-09-22 — End: 2023-09-25
  Administered 2023-09-22 – 2023-09-25 (×4): 1 via ORAL
  Filled 2023-09-21 (×4): qty 1

## 2023-09-21 MED ORDER — VANCOMYCIN HCL 1.5 G IV SOLR
1500.0000 mg | Freq: Two times a day (BID) | INTRAVENOUS | Status: DC
  Administered 2023-09-21 – 2023-09-23 (×3): 1500 mg via INTRAVENOUS
  Filled 2023-09-21 (×5): qty 30

## 2023-09-21 MED ORDER — TRAMADOL HCL 50 MG PO TABS
50.0000 mg | ORAL_TABLET | Freq: Four times a day (QID) | ORAL | Status: DC | PRN
  Administered 2023-09-21 – 2023-09-25 (×8): 50 mg via ORAL
  Filled 2023-09-21 (×8): qty 1

## 2023-09-21 MED ORDER — VANCOMYCIN HCL 1.5 G IV SOLR
1500.0000 mg | Freq: Once | INTRAVENOUS | Status: AC
  Administered 2023-09-21: 1500 mg via INTRAVENOUS
  Filled 2023-09-21: qty 30

## 2023-09-21 MED ORDER — ONDANSETRON HCL 4 MG PO TABS: 4.0000 mg | ORAL_TABLET | Freq: Four times a day (QID) | ORAL | Status: DC | PRN

## 2023-09-21 MED ORDER — ONDANSETRON HCL 4 MG/2ML IJ SOLN: 4.0000 mg | Freq: Four times a day (QID) | INTRAMUSCULAR | Status: DC | PRN

## 2023-09-21 MED ORDER — NICOTINE 21 MG/24HR TD PT24: 21.0000 mg | MEDICATED_PATCH | Freq: Every day | TRANSDERMAL | Status: DC | PRN

## 2023-09-21 MED ORDER — IOHEXOL 300 MG/ML  SOLN
75.0000 mL | Freq: Once | INTRAMUSCULAR | Status: AC | PRN
Start: 2023-09-21 — End: 2023-09-21
  Administered 2023-09-21: 75 mL via INTRAVENOUS

## 2023-09-21 NOTE — Progress Notes (Signed)
Pharmacy Note   A consult was received from an ED physician for vancomycin per pharmacy dosing.    The patient's profile has been reviewed for ht/wt/allergies/indication/available labs.    A one time order has been placed for vancomycin 1500 mg IV x1 .    Further antibiotics/pharmacy consults should be ordered by admitting physician if indicated.                       Thank you,  Adalberto Cole, PharmD, BCPS 09/21/2023 10:06 AM

## 2023-09-21 NOTE — Progress Notes (Signed)
Pharmacy Antibiotic Note  Pedro Owens is a 32 y.o. male admitted on 09/21/2023 with cellulitis. Patient reports he had a pimple below right nare several days PTA and popped it; since then he has had increased pain and swelling. CT with small area of low density with peripheral enhancement in the right inferior paranasal soft tissues, which measures 5 x 13 x 7 mm, which may represent a small abscess. Pharmacy has been consulted for vancomycin dosing dosing.  Plan: -Vancomycin 1500 mg IV x 1 given; follow with 1500 mg IV q12h -Follow renal function, cultures and clinical progress for dose adjustments and de-escalation as indicated  Height: 5\' 11"  (180.3 cm) Weight: 74.8 kg (164 lb 14.5 oz) IBW/kg (Calculated) : 75.3  Temp (24hrs), Avg:98.8 F (37.1 C), Min:98.5 F (36.9 C), Max:99 F (37.2 C)  Recent Labs  Lab 09/21/23 1003  WBC 10.9*  CREATININE 0.86    Estimated Creatinine Clearance: 131.7 mL/min (by C-G formula based on SCr of 0.86 mg/dL).    Allergies  Allergen Reactions   Acetaminophen Diarrhea, Nausea And Vomiting and Other (See Comments)    Flares up crohns disease    Other Other (See Comments)    IV CONTRAST    Vicodin [Hydrocodone-Acetaminophen] Nausea And Vomiting   Dilaudid [Hydromorphone Hcl] Hives and Rash    Antimicrobials this admission: Vancomycin 12/3 >>  Dose adjustments this admission: NA  Microbiology results: NA  Thank you for allowing pharmacy to be a part of this patient's care.  Pricilla Riffle, PharmD, BCPS Clinical Pharmacist 09/21/2023 3:28 PM

## 2023-09-21 NOTE — ED Triage Notes (Signed)
Pt arrived with swelling to R side of face x4 days. Reports he popped a pimple on right side of nose and face started to swell after, getting worse every day. States this morning, pain to R eye. Pressure behind right eye. Afebrile at this time. Patient has reported chills within the last day.Denies shob, airway patient. No other symptoms reported

## 2023-09-21 NOTE — H&P (Signed)
History and Physical    Patient: Pedro Owens RUE:[id number] DOB: 06/07/1991 DOA: 09/21/2023 DOS: the patient was seen and examined on 09/21/2023 PCP: Pcp, No  Patient coming from: Home  Chief Complaint:  Chief Complaint  Patient presents with   Facial Swelling   HPI: Pedro Owens is a 32 y.o. male with medical history significant of ADHD and anal wart, anxiety, asthma, bipolar disorders, chronic bronchitis, chronic pain syndrome, depression, gonorrhea, headache, hemorrhoids, hepatitis C, herpes, HIV infection, hypertension, uveitis, reactive lymphoid hyperplasia, cannabis abuse, monthly migraines, pseudoseizures, syphilis who presented to the emergency department with complaints of facial swelling after he "popped a pimple in his right nostril daily for the past 2 days developing facial erythema and tenderness after the second time. He denied fever, chills, rhinorrhea, sore throat, wheezing or hemoptysis.  No chest pain, palpitations, diaphoresis, PND, orthopnea or pitting edema of the lower extremities.  No abdominal pain, nausea, emesis, diarrhea, constipation, melena or hematochezia.  No flank pain, dysuria, frequency or hematuria.  No polyuria, polydipsia, polyphagia or blurred vision.   Lab work: CBC showed a Brault count of 10.9, hemoglobin 17.4 g/dL platelets 147.  BMP was normal.  Imaging: CT maxillofacial with contrast shows small area of low density with peripheral enhancement in the right inferior paranasal soft tissues, which measures 5 x 13 x 7 mm, which may represent a small abscess.  There is inflammatory stranding in the right greater than left facial soft tissues, which may represent cellulitis.  Dental caries in the bilateral second premolars.  ED course: Initial vital signs were temperature 98.5 F, pulse 99, respiration 20, BP 150/105 mmHg O2 sat 98% on room air.  The patient was started on vancomycin in the emergency department.  He also received ketorolac 15 mg IVP.    Review of Systems: As mentioned in the history of present illness. All other systems reviewed and are negative.  Past Medical History:  Diagnosis Date   ADHD (attention deficit hyperactivity disorder)    Anal warts    Anxiety    Asthma    Bipolar disorder (HCC)    Chronic bronchitis (HCC)    Chronic pain syndrome    Convulsions (HCC) 08/14/2015   Depression    Gonorrhea 09/08/11   Headache    "weekly" (11/06/2016)   Hemorrhoids, internal, with bleeding 08/25/2011   Hepatitis C    Herpes    HIV infection (HCC)    Hypertension    ILEITIS 09/24/2010   Qualifier: Diagnosis of  By: Candice Camp CMA (AAMA), Dottie     Lymphoid hyperplasia, reactive    Marijuana abuse    Migraine    "monthly" (11/06/2016)   Pseudoseizures    Seizures (HCC)    "don't know why I have them" (11/06/2016)   Syphilis    Past Surgical History:  Procedure Laterality Date   ANKLE SURGERY Bilateral 2005   Screw & Pins   ANKLE SURGERY Right 2008   "replaced pins & screws"   FOOT SURGERY Bilateral 2005   "bone spurs"   I & D EXTREMITY Right 06/26/2016   Procedure: IRRIGATION AND DEBRIDEMENT RIGHT FOREARM;  Surgeon: Betha Loa, MD;  Location: [facility] OR;  Service: Orthopedics;  Laterality: Right;   I & D EXTREMITY Right 11/04/2016   Procedure: IRRIGATION AND DEBRIDEMENT EXTREMITY;  Surgeon: Dairl Ponder, MD;  Location: [facility] OR;  Service: Orthopedics;  Laterality: Right;   I & D EXTREMITY Right 11/06/2016   Procedure: IRRIGATION AND DEBRIDEMENT right forarm;  Surgeon: Molli Hazard  Mina Marble, MD;  Location: [facility] OR;  Service: Orthopedics;  Laterality: Right;   TEE WITHOUT CARDIOVERSION N/A 11/19/2017   Procedure: TRANSESOPHAGEAL ECHOCARDIOGRAM (TEE);  Surgeon: Pricilla Riffle, MD;  [facility] END[facility];  Service: Cardiovascular;  Laterality: N/A;   VIDEO ASSISTED THORACOSCOPY (VATS)/EMPYEMA Left 11/26/2017   Procedure: VIDEO ASSISTED THORACOSCOPY (VATS)/DRAIN EMPYEMA;  Surgeon: Kerin Perna, MD;  Location: [facility] OR;  Service:  Thoracic;  Laterality: Left;   Social History:  reports that he has been smoking cigarettes. He has a 16.5 pack-year smoking history. He has never used smokeless tobacco. He reports current alcohol use. He reports that he does not currently use drugs after having used the following drugs: Cocaine, Benzodiazepines, Oxycodone, Marijuana, and Heroin. Frequency: 2.00 times per week.  Allergies  Allergen Reactions   Acetaminophen Diarrhea, Nausea And Vomiting and Other (See Comments)    Flares up crohns disease    Other Other (See Comments)    IV CONTRAST    Vicodin [Hydrocodone-Acetaminophen] Nausea And Vomiting   Dilaudid [Hydromorphone Hcl] Hives and Rash    Family History  Problem Relation Age of Onset   Diabetes Mother    Irritable bowel syndrome Mother    Hypertension Mother    Hyperlipidemia Mother    Hypothyroidism Mother    Diabetes Maternal Uncle    Heart disease Maternal Grandmother    Leukemia Other        maternal greatgrandmother   Colon cancer Neg Hx     Prior to Admission medications   Medication Sig Start Date End Date Taking? Authorizing Provider  bictegravir-emtricitabine-tenofovir AF (BIKTARVY) 50-200-25 MG TABS tablet Take 1 tablet by mouth daily. 03/03/23   Vu, Tonita Phoenix, MD  ondansetron (ZOFRAN) 4 MG tablet Take 1 tablet (4 mg total) by mouth every 8 (eight) hours as needed for nausea or vomiting. 05/06/23   Judyann Munson, MD  valACYclovir (VALTREX) 500 MG tablet Take 1 tablet (500 mg total) by mouth daily. 03/03/23 02/26/24  Vu, Gershon Mussel T, MD  divalproex (DEPAKOTE) 500 MG DR tablet Take 1 tablet (500 mg total) by mouth 2 (two) times daily. 02/28/16 02/28/16  Lindalou Hose, MD    Physical Exam: Vitals:   09/21/23 0854 09/21/23 0913 09/21/23 1002 09/21/23 1256  BP: (!) 150/105  135/79 (!) 137/91  Pulse: 99  95 74  Resp: 20  20 12   Temp: 98.5 F (36.9 C)   99 F (37.2 C)  TempSrc: Oral   Oral  SpO2: 98%  97% 98%  Weight:  74.8 kg    Height:  5\' 11"  (1.803 m)      Physical Exam Vitals and nursing note reviewed.  Constitutional:      General: He is awake. He is not in acute distress.    Appearance: Normal appearance. He is normal weight. He is ill-appearing.  HENT:     Head: Normocephalic.     Comments: Right facial area edema with mild erythema, calor and TTP.    Nose: No rhinorrhea.     Mouth/Throat:     Mouth: Mucous membranes are moist.  Eyes:     General: No scleral icterus.    Pupils: Pupils are equal, round, and reactive to light.  Neck:     Vascular: No JVD.  Cardiovascular:     Rate and Rhythm: Normal rate and regular rhythm.     Heart sounds: S1 normal and S2 normal.  Pulmonary:     Effort: Pulmonary effort is normal.     Breath sounds:  Normal breath sounds.  Abdominal:     General: Bowel sounds are normal.     Palpations: Abdomen is soft.     Tenderness: There is no abdominal tenderness.  Musculoskeletal:     Cervical back: Neck supple.     Right lower leg: No edema.     Left lower leg: No edema.  Skin:    General: Skin is warm and dry.  Neurological:     General: No focal deficit present.     Mental Status: He is alert and oriented to person, place, and time.  Psychiatric:        Mood and Affect: Mood normal.        Behavior: Behavior normal. Behavior is cooperative.     Data Reviewed:  Results are pending, will review when available.  Assessment and Plan: Principal Problem:   Facial cellulitis Admit to MedSurg/inpatient. Continue IV fluids. Continue ketorolac 15 mg IVP every 6 hours x 4. Tramadol 50 mg every 6 hours as needed. Continue vancomycin per pharmacy. Follow CBC and CMP in a.m.  Active Problems:   HIV disease (HCC) Continue Biktarvy 1 tablet p.o. daily.    Cigarette smoker Tobacco cessation advised. Nicotine replacement therapy ordered PRN.    Polysubstance abuse (HCC) In remission according to the patient.    Chronic hepatitis C without hepatic coma [facility]) Follow-up with  GI/hepatology as an outpatient.    Advance Care Planning:   Code Status: Full Code   Consults:   Family Communication:   Severity of Illness: The appropriate patient status for this patient is INPATIENT. Inpatient status is judged to be reasonable and necessary in order to provide the required intensity of service to ensure the patient's safety. The patient's presenting symptoms, physical exam findings, and initial radiographic and laboratory data in the context of their chronic comorbidities is felt to place them at high risk for further clinical deterioration. Furthermore, it is not anticipated that the patient will be medically stable for discharge from the hospital within 2 midnights of admission.   * I certify that at the point of admission it is my clinical judgment that the patient will require inpatient hospital care spanning beyond 2 midnights from the point of admission due to high intensity of service, high risk for further deterioration and high frequency of surveillance required.*  Author: Bobette Mo, MD 09/21/2023 2:24 PM  For on call review [email or web address].   This document was prepared using Dragon voice recognition software and may contain some unintended transcription errors.

## 2023-09-21 NOTE — ED Provider Notes (Signed)
Pedro Owens EMERGENCY DEPARTMENT AT [facility] Provider Note   CSN: [id number] Arrival date & time: 09/21/23  0848     History  Chief Complaint  Patient presents with   Facial Swelling    Pedro Owens is a 32 y.o. male.  HPI   Patient has a history of ADHD HIV, seizures who presents to the ED with complaints of facial swelling.  Patient states he had a pimple below the right nares several days ago.  Patient states he popped it and since then he started having increasing pain and swelling.  Patient states he had an abscess in extremity before and the symptoms he is feeling are similar.  Patient denies any difficulty swallowing.  No difficulty breathing.  He has had chills but has not measured any fevers.  Home Medications Prior to Admission medications   Medication Sig Start Date End Date Taking? Authorizing Provider  bictegravir-emtricitabine-tenofovir AF (BIKTARVY) 50-200-25 MG TABS tablet Take 1 tablet by mouth daily. 03/03/23   Vu, Tonita Phoenix, MD  ondansetron (ZOFRAN) 4 MG tablet Take 1 tablet (4 mg total) by mouth every 8 (eight) hours as needed for nausea or vomiting. 05/06/23   Judyann Munson, MD  valACYclovir (VALTREX) 500 MG tablet Take 1 tablet (500 mg total) by mouth daily. 03/03/23 02/26/24  Vu, Gershon Mussel T, MD  divalproex (DEPAKOTE) 500 MG DR tablet Take 1 tablet (500 mg total) by mouth 2 (two) times daily. 02/28/16 02/28/16  Lindalou Hose, MD      Allergies    Acetaminophen, Other, Vicodin [hydrocodone-acetaminophen], and Dilaudid [hydromorphone hcl]    Review of Systems   Review of Systems  Physical Exam Updated Vital Signs BP (!) 137/91   Pulse 74   Temp 99 F (37.2 C) (Oral)   Resp 12   Ht 1.803 m (5\' 11" )   Wt 74.8 kg   SpO2 98%   BMI 23.00 kg/m  Physical Exam Vitals and nursing note reviewed.  Constitutional:      Appearance: He is well-developed. He is not diaphoretic.  HENT:     Head: Normocephalic and atraumatic.     Comments: Swelling and  induration with erythema noted below the right nares, involves the upper lip, tenderness to palpation, no fluctuance, no discrete pustule noted    Right Ear: External ear normal.     Left Ear: External ear normal.  Eyes:     General: No scleral icterus.       Right eye: No discharge.        Left eye: No discharge.     Conjunctiva/sclera: Conjunctivae normal.  Neck:     Trachea: No tracheal deviation.  Cardiovascular:     Rate and Rhythm: Normal rate and regular rhythm.  Pulmonary:     Effort: Pulmonary effort is normal. No respiratory distress.     Breath sounds: Normal breath sounds. No stridor. No wheezing or rales.  Abdominal:     General: Bowel sounds are normal. There is no distension.     Palpations: Abdomen is soft.     Tenderness: There is no abdominal tenderness. There is no guarding or rebound.  Musculoskeletal:        General: No tenderness or deformity.     Cervical back: Neck supple.  Skin:    General: Skin is warm and dry.     Findings: No rash.  Neurological:     General: No focal deficit present.     Mental Status: He is alert.  Cranial Nerves: No cranial nerve deficit, dysarthria or facial asymmetry.     Sensory: No sensory deficit.     Motor: No abnormal muscle tone or seizure activity.     Coordination: Coordination normal.  Psychiatric:        Mood and Affect: Mood normal.     ED Results / Procedures / Treatments   Labs (all labs ordered are listed, but only abnormal results are displayed) Labs Reviewed  CBC - Abnormal; Notable for the following components:      Result Value   WBC 10.9 (*)    RBC 5.95 (*)    Hemoglobin 17.4 (*)    HCT 53.2 (*)    All other components within normal limits  BASIC METABOLIC PANEL    EKG None  Radiology CT Maxillofacial W Contrast  Result Date: 09/21/2023 CLINICAL DATA:  Facial infection, nasal abscess EXAM: CT MAXILLOFACIAL WITH CONTRAST TECHNIQUE: Multidetector CT imaging of the maxillofacial structures  was performed with intravenous contrast. Multiplanar CT image reconstructions were also generated. RADIATION DOSE REDUCTION: This exam was performed according to the departmental dose-optimization program which includes automated exposure control, adjustment of the mA and/or kV according to patient size and/or use of iterative reconstruction technique. CONTRAST:  75mL OMNIPAQUE IOHEXOL 300 MG/ML  SOLN COMPARISON:  No prior CT maxillofacial available, correlation is made with 03/27/2023 CT head FINDINGS: Osseous: No fracture or mandibular dislocation. No destructive process. Dental caries in the bilateral second premolars. No advanced periapical lucency. Orbits: Negative. No traumatic or inflammatory finding. Sinuses: Minimal mucosal thickening in the left frontal sinus. Otherwise clear paranasal sinuses. The mastoids are well aerated. Soft tissues: No inflammatory stranding in the right greater than left facial soft tissues. Small area of low density with peripheral enhancement in the right inferior paranasal soft tissues, which measures 5 x 13 x 7 mm (AP x TR x CC) (series 3, image 45 and series 7, image 41). Which may represent a small abscess. No other focal collection is seen. Limited intracranial: No significant or unexpected finding. IMPRESSION: 1. Small area of low density with peripheral enhancement in the right inferior paranasal soft tissues, which measures 5 x 13 x 7 mm, which may represent a small abscess. 2. Inflammatory stranding in the right greater than left facial soft tissues, which may represent cellulitis. 3. Dental caries in the bilateral second premolars. Electronically Signed   By: Wiliam Ke M.D.   On: 09/21/2023 13:42    Procedures Procedures    Medications Ordered in ED Medications  ketorolac (TORADOL) 15 MG/ML injection 15 mg (15 mg Intravenous Given 09/21/23 1031)  Vancomycin (VANCOCIN) 1,500 mg in sodium chloride 0.9 % 500 mL IVPB (0 mg Intravenous Stopped 09/21/23 1245)   iohexol (OMNIPAQUE) 300 MG/ML solution 75 mL (75 mLs Intravenous Contrast Given 09/21/23 1314)    ED Course/ Medical Decision Making/ A&P Clinical Course as of 09/21/23 1436  Tue Sep 21, 2023  1129 CBC(!) Thibeau blood cell count and hemoglobin increased [JK]  1347 CT scan does show small area in the right inferior paranasal soft tissue with possible small abscess [JK]  1436 Case discussed with Dr. Robb Matar [JK]    Clinical Course User Index [JK] Linwood Dibbles, MD                                 Medical Decision Making Problems Addressed: Facial cellulitis: acute illness or injury that poses a threat to life  or bodily functions  Amount and/or Complexity of Data Reviewed Labs: ordered. Decision-making details documented in ED Course. Radiology: ordered and independent interpretation performed.  Risk Prescription drug management. Decision regarding hospitalization.   Patient presented to the ED for evaluation of facial swelling.  Patient has had increasing induration and erythema and tenderness on the right side of his face adjacent to his nose.  Patient has notable induration.  No pustules noted.  CT scan shows inflammatory stranding and a possible small abscess on CT scan.  I do not think the abscess is large enough to require incision and drainage at this time.  With his history of HIV and the facial infection patient has been given IV antibiotics.  I will consult the medical service for admission.        Final Clinical Impression(s) / ED Diagnoses Final diagnoses:  Facial cellulitis    Rx / DC Orders ED Discharge Orders     None         Linwood Dibbles, MD 09/21/23 1436

## 2023-09-21 NOTE — ED Notes (Signed)
ED TO INPATIENT HANDOFF REPORT  ED Nurse Name and Phone #: Crist Infante, RN [phone]  S Name/Age/Gender Pedro Owens 32 y.o. male Room/Bed: WA24/WA24  Code Status   Code Status: Prior  Home/SNF/Other Home Patient oriented to: self, place, time, and situation Is this baseline? Yes   Triage Complete: Triage complete  Chief Complaint Facial cellulitis [L03.211]  Triage Note Pt arrived with swelling to R side of face x4 days. Reports he popped a pimple on right side of nose and face started to swell after, getting worse every day. States this morning, pain to R eye. Pressure behind right eye. Afebrile at this time. Patient has reported chills within the last day.Denies shob, airway patient. No other symptoms reported   Allergies Allergies  Allergen Reactions   Acetaminophen Diarrhea, Nausea And Vomiting and Other (See Comments)    Flares up crohns disease    Other Other (See Comments)    IV CONTRAST    Vicodin [Hydrocodone-Acetaminophen] Nausea And Vomiting   Dilaudid [Hydromorphone Hcl] Hives and Rash    Level of Care/Admitting Diagnosis ED Disposition     ED Disposition  Admit   Condition  --   Comment  Hospital Area: [facility] [[id number]]  Level of Care: Med-Surg [16]  May admit patient to [facility] or [facility] if equivalent level of care is available:: No  Covid Evaluation: Asymptomatic - no recent exposure (last 10 days) testing not required  Diagnosis: Facial cellulitis [454098]  Admitting Physician: Bobette Mo [[id number]]  Attending Physician: Bobette Mo [[id number]]  Certification:: I certify this patient will need inpatient services for at least 2 midnights  Expected Medical Readiness: 09/23/2023          B Medical/Surgery History Past Medical History:  Diagnosis Date   ADHD (attention deficit hyperactivity disorder)    Anal warts    Anxiety    Asthma    Bipolar disorder (HCC)    Chronic bronchitis (HCC)     Chronic pain syndrome    Convulsions (HCC) 08/14/2015   Depression    Gonorrhea 09/08/11   Headache    "weekly" (11/06/2016)   Hemorrhoids, internal, with bleeding 08/25/2011   Hepatitis C    Herpes    HIV infection (HCC)    Hypertension    ILEITIS 09/24/2010   Qualifier: Diagnosis of  By: Candice Camp CMA (AAMA), Dottie     Lymphoid hyperplasia, reactive    Marijuana abuse    Migraine    "monthly" (11/06/2016)   Pseudoseizures    Seizures (HCC)    "don't know why I have them" (11/06/2016)   Syphilis    Past Surgical History:  Procedure Laterality Date   ANKLE SURGERY Bilateral 2005   Screw & Pins   ANKLE SURGERY Right 2008   "replaced pins & screws"   FOOT SURGERY Bilateral 2005   "bone spurs"   I & D EXTREMITY Right 06/26/2016   Procedure: IRRIGATION AND DEBRIDEMENT RIGHT FOREARM;  Surgeon: Betha Loa, MD;  Location: [facility] OR;  Service: Orthopedics;  Laterality: Right;   I & D EXTREMITY Right 11/04/2016   Procedure: IRRIGATION AND DEBRIDEMENT EXTREMITY;  Surgeon: Dairl Ponder, MD;  Location: [facility] OR;  Service: Orthopedics;  Laterality: Right;   I & D EXTREMITY Right 11/06/2016   Procedure: IRRIGATION AND DEBRIDEMENT right forarm;  Surgeon: Dairl Ponder, MD;  Location: [facility] OR;  Service: Orthopedics;  Laterality: Right;   TEE WITHOUT CARDIOVERSION N/A 11/19/2017   Procedure: TRANSESOPHAGEAL ECHOCARDIOGRAM (TEE);  Surgeon:  Pricilla Riffle, MD;  Location: [facility] ENDOSCOPY;  Service: Cardiovascular;  Laterality: N/A;   VIDEO ASSISTED THORACOSCOPY (VATS)/EMPYEMA Left 11/26/2017   Procedure: VIDEO ASSISTED THORACOSCOPY (VATS)/DRAIN EMPYEMA;  Surgeon: Kerin Perna, MD;  Location: [facility] OR;  Service: Thoracic;  Laterality: Left;     A IV Location/Drains/Wounds Patient Lines/Drains/Airways Status     Active Line/Drains/Airways     Name Placement date Placement time Site Days   Peripheral IV 09/21/23 20 G 1" Left;Proximal;Posterior Forearm 09/21/23  1030  Forearm  less than 1             Intake/Output Last 24 hours  Intake/Output Summary (Last 24 hours) at 09/21/2023 1435 Last data filed at 09/21/2023 1245 Gross per 24 hour  Intake 500.36 ml  Output --  Net 500.36 ml    Labs/Imaging Results for orders placed or performed during the hospital encounter of 09/21/23 (from the past 48 hour(s))  CBC     Status: Abnormal   Collection Time: 09/21/23 10:03 AM  Result Value Ref Range   WBC 10.9 (H) 4.0 - 10.5 K/uL   RBC 5.95 (H) 4.22 - 5.81 MIL/uL   Hemoglobin 17.4 (H) 13.0 - 17.0 g/dL   HCT 16.1 (H) 09.6 - 04.5 %   MCV 89.4 80.0 - 100.0 fL   MCH 29.2 26.0 - 34.0 pg   MCHC 32.7 30.0 - 36.0 g/dL   RDW 40.9 81.1 - 91.4 %   Platelets 348 150 - 400 K/uL   nRBC 0.0 0.0 - 0.2 %    Comment: Performed at [facility], [address] [place], [place] [address]  Basic metabolic panel     Status: None   Collection Time: 09/21/23 10:03 AM  Result Value Ref Range   Sodium 136 135 - 145 mmol/L   Potassium 4.7 3.5 - 5.1 mmol/L    Comment: HEMOLYSIS AT THIS LEVEL MAY AFFECT RESULT   Chloride 103 98 - 111 mmol/L   CO2 24 22 - 32 mmol/L   Glucose, Bld 86 70 - 99 mg/dL    Comment: Glucose reference range applies only to samples taken after fasting for at least 8 hours.   BUN 18 6 - 20 mg/dL   Creatinine, Ser 6.21 0.61 - 1.24 mg/dL   Calcium 9.3 8.9 - 30.8 mg/dL   GFR, Estimated >65 >78 mL/min    Comment: (NOTE) Calculated using the CKD-EPI Creatinine Equation (2021) CORRECTED ON 12/03 AT 1110: PREVIOUSLY REPORTED AS >60    Anion gap 9 5 - 15    Comment: Performed at [facility], [address] [place], [place] [address]   CT Maxillofacial W Contrast  Result Date: 09/21/2023 CLINICAL DATA:  Facial infection, nasal abscess EXAM: CT MAXILLOFACIAL WITH CONTRAST TECHNIQUE: Multidetector CT imaging of the maxillofacial structures was performed with intravenous contrast. Multiplanar CT image reconstructions were also generated. RADIATION DOSE  REDUCTION: This exam was performed according to the departmental dose-optimization program which includes automated exposure control, adjustment of the mA and/or kV according to patient size and/or use of iterative reconstruction technique. CONTRAST:  75mL OMNIPAQUE IOHEXOL 300 MG/ML  SOLN COMPARISON:  No prior CT maxillofacial available, correlation is made with 03/27/2023 CT head FINDINGS: Osseous: No fracture or mandibular dislocation. No destructive process. Dental caries in the bilateral second premolars. No advanced periapical lucency. Orbits: Negative. No traumatic or inflammatory finding. Sinuses: Minimal mucosal thickening in the left frontal sinus. Otherwise clear paranasal sinuses. The mastoids are well aerated. Soft tissues:  No inflammatory stranding in the right greater than left facial soft tissues. Small area of low density with peripheral enhancement in the right inferior paranasal soft tissues, which measures 5 x 13 x 7 mm (AP x TR x CC) (series 3, image 45 and series 7, image 41). Which may represent a small abscess. No other focal collection is seen. Limited intracranial: No significant or unexpected finding. IMPRESSION: 1. Small area of low density with peripheral enhancement in the right inferior paranasal soft tissues, which measures 5 x 13 x 7 mm, which may represent a small abscess. 2. Inflammatory stranding in the right greater than left facial soft tissues, which may represent cellulitis. 3. Dental caries in the bilateral second premolars. Electronically Signed   By: Wiliam Ke M.D.   On: 09/21/2023 13:42    Pending Labs Unresulted Labs (From admission, onward)    None       Vitals/Pain Today's Vitals   09/21/23 0854 09/21/23 0913 09/21/23 1002 09/21/23 1256  BP: (!) 150/105  135/79 (!) 137/91  Pulse: 99  95 74  Resp: 20  20 12   Temp: 98.5 F (36.9 C)   99 F (37.2 C)  TempSrc: Oral   Oral  SpO2: 98%  97% 98%  Weight:  74.8 kg    Height:  5\' 11"  (1.803 m)     PainSc:  10-Worst pain ever 10-Worst pain ever     Isolation Precautions No active isolations  Medications Medications  ketorolac (TORADOL) 15 MG/ML injection 15 mg (15 mg Intravenous Given 09/21/23 1031)  Vancomycin (VANCOCIN) 1,500 mg in sodium chloride 0.9 % 500 mL IVPB (0 mg Intravenous Stopped 09/21/23 1245)  iohexol (OMNIPAQUE) 300 MG/ML solution 75 mL (75 mLs Intravenous Contrast Given 09/21/23 1314)    Mobility walks     Focused Assessments Neuro Assessment Handoff:  Swallow screen pass?  N/A         Neuro Assessment:   Neuro Checks:      Has TPA been given? No If patient is a Neuro Trauma and patient is going to OR before floor call report to 4N Charge nurse: [phone] or [phone]   R Recommendations: See Admitting Provider Note  Report given to:   Additional Notes:

## 2023-09-22 ENCOUNTER — Other Ambulatory Visit: Payer: Self-pay

## 2023-09-22 DIAGNOSIS — L0201 Cutaneous abscess of face: Secondary | ICD-10-CM

## 2023-09-22 DIAGNOSIS — B20 Human immunodeficiency virus [HIV] disease: Secondary | ICD-10-CM

## 2023-09-22 DIAGNOSIS — B182 Chronic viral hepatitis C: Secondary | ICD-10-CM | POA: Diagnosis not present

## 2023-09-22 DIAGNOSIS — L03211 Cellulitis of face: Secondary | ICD-10-CM | POA: Diagnosis not present

## 2023-09-22 DIAGNOSIS — F1910 Other psychoactive substance abuse, uncomplicated: Secondary | ICD-10-CM

## 2023-09-22 LAB — MRSA NEXT GEN BY PCR, NASAL: MRSA by PCR Next Gen: DETECTED — AB

## 2023-09-22 MED ORDER — MORPHINE SULFATE (PF) 2 MG/ML IV SOLN
2.0000 mg | Freq: Once | INTRAVENOUS | Status: AC
  Administered 2023-09-23: 2 mg via INTRAVENOUS
  Filled 2023-09-22: qty 1

## 2023-09-22 MED ORDER — OXYCODONE HCL 5 MG PO TABS
5.0000 mg | ORAL_TABLET | Freq: Four times a day (QID) | ORAL | Status: DC | PRN
  Administered 2023-09-22: 10 mg via ORAL
  Administered 2023-09-23: 5 mg via ORAL
  Administered 2023-09-23 – 2023-09-24 (×3): 10 mg via ORAL
  Filled 2023-09-22: qty 1
  Filled 2023-09-22 (×4): qty 2
  Filled 2023-09-22: qty 1

## 2023-09-22 MED ORDER — SODIUM CHLORIDE 0.9 % IV SOLN
3.0000 g | Freq: Four times a day (QID) | INTRAVENOUS | Status: DC
  Administered 2023-09-22 – 2023-09-25 (×11): 3 g via INTRAVENOUS
  Filled 2023-09-22 (×14): qty 8

## 2023-09-22 NOTE — Progress Notes (Signed)
Progress Note   Patient: Pedro Owens IEP:[id number] DOB: 12/06/1990 DOA: 09/21/2023     1 DOS: the patient was seen and examined on 09/22/2023   Brief hospital course: 32 year old man PMH HIV, hepatitis C, herpes, syphilis, ADHD, presented to the emergency department with facial swelling after popping a pimple right nostril and developing extensive facial erythema.  Admitted for right facial cellulitis with small paranasal abscess.  Following day no clinical improvement, on exam abscess appears to be significantly enlarged and when palpated does drain a bit into the nose.  Discussed with ENT, requested transfer to [facility] for evaluation and consideration of operative intervention.  Infectious disease also will consult at [facility].  Consultants ID ENT Dr Jearld Fenton  Procedures   Assessment and Plan: Right facial cellulitis with small right paranasal abscess in the context of HIV Began several days ago after popping a pimple at home No significant improvement despite starting vancomycin yesterday.  Has significant facial distortion and fluctuance over the right zygomatic arch suggesting enlargement of abscess.  He does have spontaneous drainage into his nose with palpation of same area. Discussed with Dr. Jearld Fenton ENT, he requested transfer to [facility] for ENT evaluation and possible intervention.  Discussed with Dr. Thedore Mins, infectious disease will see in consult at [facility].  Discussed with patient who consented to transfer.  HIV Continue Biktarvy  Polysubstance abuse In remission according to patient  Chronic hepatitis C Outpatient follow-up with ID.  Cigarette smoker.  Recommend cessation.     Subjective:  No improvement.  Right side the face still hurts and is swollen. He popped a pimple a few days ago and it recurred several times.  He does have spontaneous drainage of blood and pus from the right side of his nose.  This is exacerbated with palpation of the soft tissue over his  right zygomatic arch by report.  Significant pain over upper lip.  Toradol helping somewhat.  Physical Exam: Vitals:   09/21/23 2325 09/21/23 2333 09/22/23 0435 09/22/23 1330  BP: (!) 153/100  130/88 (!) 142/91  Pulse: 87  (!) 56 (!) 58  Resp: 15  16   Temp:  98.3 F (36.8 C) 97.7 F (36.5 C) 98 F (36.7 C)  TempSrc:  Oral Oral Oral  SpO2: 97%  99% 100%  Weight:      Height:       Physical Exam Vitals reviewed.  Constitutional:      General: He is not in acute distress.    Appearance: He is not ill-appearing or toxic-appearing.  HENT:     Head:     Comments: Head appears grossly normal.  He does have significant facial distortion on the right side with edema.  No erythema is appreciated.  He has fluctuance with palpation over the right zygomatic arch which she reports causes drainage to spontaneously appear in his nostril.  He has got induration over the upper lip which is tender to palpation.  When he smiles he appears to have a facial droop secondary to pain and distortion of the skin from swelling. Cardiovascular:     Rate and Rhythm: Normal rate and regular rhythm.     Heart sounds: No murmur heard. Pulmonary:     Effort: Pulmonary effort is normal. No respiratory distress.     Breath sounds: No wheezing, rhonchi or rales.  Neurological:     Mental Status: He is alert.  Psychiatric:        Mood and Affect: Mood normal.  Behavior: Behavior normal.     Data Reviewed: No labs today.  Admission labs reviewed.  BMP unremarkable.  WBC slightly elevated 10.9. CT maxillofacial noted   Family Communication: none  Disposition: Status is: Inpatient Remains inpatient appropriate because: right facial cellulitis      Time spent: 35 minutes  Author: Brendia Sacks, MD 09/22/2023 1:41 PM  For on call review [email or web address].

## 2023-09-22 NOTE — Plan of Care (Signed)
Pt A&O x 4. VSS, on room air. Independent in the room. MRSA nasal swab tested positive and pt continues on contact isolation precautions.  C/o right facial pain and pain managed with prn tramadol and scheduled IV toradol.   Call bell in reach. Will continue to monitor.   Problem: Education: Goal: Knowledge of General Education information will improve Description: Including pain rating scale, medication(s)/side effects and non-pharmacologic comfort measures Outcome: Progressing   Problem: Health Behavior/Discharge Planning: Goal: Ability to manage health-related needs will improve Outcome: Progressing   Problem: Clinical Measurements: Goal: Ability to maintain clinical measurements within normal limits will improve Outcome: Progressing Goal: Will remain free from infection Outcome: Progressing Goal: Diagnostic test results will improve Outcome: Progressing Goal: Respiratory complications will improve Outcome: Progressing Goal: Cardiovascular complication will be avoided Outcome: Progressing   Problem: Activity: Goal: Risk for activity intolerance will decrease Outcome: Progressing   Problem: Nutrition: Goal: Adequate nutrition will be maintained Outcome: Progressing   Problem: Coping: Goal: Level of anxiety will decrease Outcome: Progressing   Problem: Elimination: Goal: Will not experience complications related to bowel motility Outcome: Progressing Goal: Will not experience complications related to urinary retention Outcome: Progressing   Problem: Pain Management: Goal: General experience of comfort will improve Outcome: Progressing   Problem: Safety: Goal: Ability to remain free from injury will improve Outcome: Progressing   Problem: Skin Integrity: Goal: Risk for impaired skin integrity will decrease Outcome: Progressing   Problem: Clinical Measurements: Goal: Ability to avoid or minimize complications of infection will improve Outcome: Progressing    Problem: Skin Integrity: Goal: Skin integrity will improve Outcome: Progressing

## 2023-09-22 NOTE — Consult Note (Signed)
Reason for Consult:faacial cellulitis Referring Physician: Dr Pedro Owens is an 32 y.o. male.  HPI: hx of about 5 days of a pimple in the base of the vestibule of right nose. He popped it then it recurred. He tried to heating and squeezing without help. It began to hurt and he was admitted to [facility] yesterday. He was started on vancomycin and the swelling seemed to worsen. He has not had previous MRSA and not had repetitive abscesses elsewhere.   Past Medical History:  Diagnosis Date   ADHD (attention deficit hyperactivity disorder)    Anal warts    Anxiety    Asthma    Bipolar disorder (HCC)    Chronic bronchitis (HCC)    Chronic pain syndrome    Convulsions (HCC) 08/14/2015   Depression    Gonorrhea 09/08/11   Headache    "weekly" (11/06/2016)   Hemorrhoids, internal, with bleeding 08/25/2011   Hepatitis C    Herpes    HIV infection (HCC)    Hypertension    ILEITIS 09/24/2010   Qualifier: Diagnosis of  By: Candice Camp CMA (AAMA), Dottie     Lymphoid hyperplasia, reactive    Marijuana abuse    Migraine    "monthly" (11/06/2016)   Pseudoseizures    Seizures (HCC)    "don't know why I have them" (11/06/2016)   Syphilis     Past Surgical History:  Procedure Laterality Date   ANKLE SURGERY Bilateral 2005   Screw & Pins   ANKLE SURGERY Right 2008   "replaced pins & screws"   FOOT SURGERY Bilateral 2005   "bone spurs"   I & D EXTREMITY Right 06/26/2016   Procedure: IRRIGATION AND DEBRIDEMENT RIGHT FOREARM;  Surgeon: Betha Loa, MD;  Location: [facility] OR;  Service: Orthopedics;  Laterality: Right;   I & D EXTREMITY Right 11/04/2016   Procedure: IRRIGATION AND DEBRIDEMENT EXTREMITY;  Surgeon: Dairl Ponder, MD;  Location: [facility] OR;  Service: Orthopedics;  Laterality: Right;   I & D EXTREMITY Right 11/06/2016   Procedure: IRRIGATION AND DEBRIDEMENT right forarm;  Surgeon: Dairl Ponder, MD;  Location: [facility] OR;  Service: Orthopedics;  Laterality: Right;   TEE WITHOUT  CARDIOVERSION N/A 11/19/2017   Procedure: TRANSESOPHAGEAL ECHOCARDIOGRAM (TEE);  Surgeon: Pricilla Riffle, MD;  Location: [facility] ENDOSCOPY;  Service: Cardiovascular;  Laterality: N/A;   VIDEO ASSISTED THORACOSCOPY (VATS)/EMPYEMA Left 11/26/2017   Procedure: VIDEO ASSISTED THORACOSCOPY (VATS)/DRAIN EMPYEMA;  Surgeon: Kerin Perna, MD;  Location[facility] OR;  Service: Thoracic;  Laterality: Left;    Family History  Problem Relation Age of Onset   Diabetes Mother    Irritable bowel syndrome Mother    Hypertension Mother    Hyperlipidemia Mother    Hypothyroidism Mother    Diabetes Maternal Uncle    Heart disease Maternal Grandmother    Leukemia Other        maternal greatgrandmother   Colon cancer Neg Hx     Social History:  reports that he has been smoking cigarettes. He has a 16.5 pack-year smoking history. He has never used smokeless tobacco. He reports current alcohol use. He reports that he does not currently use drugs after having used the following drugs: Cocaine, Benzodiazepines, Oxycodone, Marijuana, and Heroin. Frequency: 2.00 times per week.  Allergies:  Allergies  Allergen Reactions   Acetaminophen Diarrhea, Nausea And Vomiting and Other (See Comments)    Flares up crohns disease    Other Other (See Comments)    IV CONTRAST  Vicodin [Hydrocodone-Acetaminophen] Nausea And Vomiting   Dilaudid [Hydromorphone Hcl] Hives and Rash    Medications: I have reviewed the patient's current medications.  Results for orders placed or performed during the hospital encounter of 09/21/23 (from the past 48 hour(s))  CBC     Status: Abnormal   Collection Time: 09/21/23 10:03 AM  Result Value Ref Range   WBC 10.9 (H) 4.0 - 10.5 K/uL   RBC 5.95 (H) 4.22 - 5.81 MIL/uL   Hemoglobin 17.4 (H) 13.0 - 17.0 g/dL   HCT 40.9 (H) 81.1 - 91.4 %   MCV 89.4 80.0 - 100.0 fL   MCH 29.2 26.0 - 34.0 pg   MCHC 32.7 30.0 - 36.0 g/dL   RDW 78.2 95.6 - 21.3 %   Platelets 348 150 - 400 K/uL   nRBC 0.0 0.0 -  0.2 %    Comment: Performed at [facility], [address], [place], [place] [address]  Basic metabolic panel     Status: None   Collection Time: 09/21/23 10:03 AM  Result Value Ref Range   Sodium 136 135 - 145 mmol/L   Potassium 4.7 3.5 - 5.1 mmol/L    Comment: HEMOLYSIS AT THIS LEVEL MAY AFFECT RESULT   Chloride 103 98 - 111 mmol/L   CO2 24 22 - 32 mmol/L   Glucose, Bld 86 70 - 99 mg/dL    Comment: Glucose reference range applies only to samples taken after fasting for at least 8 hours.   BUN 18 6 - 20 mg/dL   Creatinine, Ser 8.46 0.61 - 1.24 mg/dL   Calcium 9.3 8.9 - 96.2 mg/dL   GFR, Estimated >95 >28 mL/min    Comment: (NOTE) Calculated using the CKD-EPI Creatinine Equation (2021) CORRECTED ON 12/03 AT 1110: PREVIOUSLY REPORTED AS >60    Anion gap 9 5 - 15    Comment: Performed at [facility], [address] [place], [place] [address]  MRSA Next Gen by PCR, Nasal     Status: Abnormal   Collection Time: 09/21/23  5:44 PM   Specimen: Nasal Mucosa; Nasal Swab  Result Value Ref Range   MRSA by PCR Next Gen DETECTED (A) NOT DETECTED    Comment: RESULT CALLED TO, READ BACK BY AND VERIFIED WITH: GULAPPA RN @ [phone] ON 09/22/2023 BY MTA (NOTE) The GeneXpert MRSA Assay (FDA approved for NASAL specimens only), is one component of a comprehensive MRSA colonization surveillance program. It is not intended to diagnose MRSA infection nor to guide or monitor treatment for MRSA infections. Test performance is not FDA approved in patients less than 55 years old. Performed at [facility], [address], [place], [place] [address]   Culture, blood (Routine X 2) w Reflex to ID Panel     Status: None (Preliminary result)   Collection Time: 09/22/23  2:55 PM   Specimen: BLOOD RIGHT ARM  Result Value Ref Range   Specimen Description      BLOOD RIGHT ARM Performed at [facility], [address], [place], [place] [address]     Special Requests      BOTTLES DRAWN AEROBIC AND ANAEROBIC Blood Culture results may not be optimal due to an inadequate volume of blood received in culture bottles Performed at [facility], [address], [place], [place] [address]    Culture PENDING    Report Status PENDING   Culture, blood (Routine X 2) w Reflex to ID Panel  Status: None (Preliminary result)   Collection Time: 09/22/23  2:55 PM   Specimen: BLOOD LEFT ARM  Result Value Ref Range   Specimen Description      BLOOD LEFT ARM Performed at [facility], [address] [place], [place] [address]    Special Requests      BOTTLES DRAWN AEROBIC AND ANAEROBIC Blood Culture results may not be optimal due to an inadequate volume of blood received in culture bottles Performed at [facility], [address], [place], [place] [address]    Culture PENDING    Report Status PENDING     CT Maxillofacial W Contrast  Result Date: 09/21/2023 CLINICAL DATA:  Facial infection, nasal abscess EXAM: CT MAXILLOFACIAL WITH CONTRAST TECHNIQUE: Multidetector CT imaging of the maxillofacial structures was performed with intravenous contrast. Multiplanar CT image reconstructions were also generated. RADIATION DOSE REDUCTION: This exam was performed according to the departmental dose-optimization program which includes automated exposure control, adjustment of the mA and/or kV according to patient size and/or use of iterative reconstruction technique. CONTRAST:  75mL OMNIPAQUE IOHEXOL 300 MG/ML  SOLN COMPARISON:  No prior CT maxillofacial available, correlation is made with 03/27/2023 CT head FINDINGS: Osseous: No fracture or mandibular dislocation. No destructive process. Dental caries in the bilateral second premolars. No advanced periapical lucency. Orbits: Negative. No traumatic or inflammatory finding. Sinuses: Minimal mucosal thickening in the left frontal sinus. Otherwise clear paranasal sinuses. The  mastoids are well aerated. Soft tissues: No inflammatory stranding in the right greater than left facial soft tissues. Small area of low density with peripheral enhancement in the right inferior paranasal soft tissues, which measures 5 x 13 x 7 mm (AP x TR x CC) (series 3, image 45 and series 7, image 41). Which may represent a small abscess. No other focal collection is seen. Limited intracranial: No significant or unexpected finding. IMPRESSION: 1. Small area of low density with peripheral enhancement in the right inferior paranasal soft tissues, which measures 5 x 13 x 7 mm, which may represent a small abscess. 2. Inflammatory stranding in the right greater than left facial soft tissues, which may represent cellulitis. 3. Dental caries in the bilateral second premolars. Electronically Signed   By: Wiliam Ke M.D.   On: 09/21/2023 13:42    ROS Blood pressure (!) 135/91, pulse (!) 59, temperature 98.5 F (36.9 C), resp. rate 18, height 5\' 11"  (1.803 m), weight 74.8 kg, SpO2 97%. Physical Exam HENT:     Head: Normocephalic.     Right Ear: External ear normal.     Left Ear: External ear normal.     Nose:     Comments: There is swelling on the right sillof the vestibule with about a 1-2 cm firm area around the base of the ala. Palpation of the area from in the mouth confirms the same. IT is very tender. There does not seem to be erythema of skin and the tissues are all soft in the cheek,infraorbital area, and lip.     Mouth/Throat:     Mouth: Mucous membranes are moist.  Eyes:     Extraocular Movements: Extraocular movements intact.     Conjunctiva/sclera: Conjunctivae normal.     Pupils: Pupils are equal, round, and reactive to light.  Neurological:     Mental Status: He is alert.       Assessment/Plan: Right nasal induration- this is a small area that does not have the usual induration of MRSA. It would not be  possible to I/D this at bedside because of his pain tolerance but I dont think  it needs I/D currently. I would recommend Zosyn for broad coverage given his HIV status. Vancomycin should also be continued. If he is not resolving in 24-48 hours or worsening will consider I/D.  Pedro Owens 09/22/2023, 8:58 PM

## 2023-09-22 NOTE — Plan of Care (Signed)
  Problem: Education: Goal: Knowledge of General Education information will improve Description: Including pain rating scale, medication(s)/side effects and non-pharmacologic comfort measures Outcome: Progressing   Problem: Health Behavior/Discharge Planning: Goal: Ability to manage health-related needs will improve Outcome: Progressing   Problem: Clinical Measurements: Goal: Ability to maintain clinical measurements within normal limits will improve Outcome: Progressing Goal: Will remain free from infection Outcome: Progressing Goal: Diagnostic test results will improve Outcome: Progressing Goal: Respiratory complications will improve Outcome: Progressing Goal: Cardiovascular complication will be avoided Outcome: Progressing   Problem: Activity: Goal: Risk for activity intolerance will decrease Outcome: Progressing   Problem: Nutrition: Goal: Adequate nutrition will be maintained Outcome: Progressing   Problem: Coping: Goal: Level of anxiety will decrease Outcome: Progressing   Problem: Elimination: Goal: Will not experience complications related to bowel motility Outcome: Progressing Goal: Will not experience complications related to urinary retention Outcome: Progressing   Problem: Pain Management: Goal: General experience of comfort will improve Outcome: Progressing   Problem: Safety: Goal: Ability to remain free from injury will improve Outcome: Progressing   Problem: Skin Integrity: Goal: Risk for impaired skin integrity will decrease Outcome: Progressing   Problem: Clinical Measurements: Goal: Ability to avoid or minimize complications of infection will improve Outcome: Progressing   Problem: Skin Integrity: Goal: Skin integrity will improve Outcome: Progressing

## 2023-09-22 NOTE — Progress Notes (Signed)
I was consulted on this patient and told he has an abscess on CT scan that is about 1 cm. He has a lot of swelling. He likely will need I/D and probably in OR. ENT surgery is not done at [facility] so I requested the patient be transferred to [facility] for treatment. I will see him at [facility].

## 2023-09-22 NOTE — Hospital Course (Signed)
32 year old man PMH HIV, hepatitis C, herpes, syphilis, ADHD, presented to the emergency department with facial swelling after popping a pimple right nostril and developing extensive facial erythema.  Admitted for right facial cellulitis with small paranasal abscess.  Following day no clinical improvement, on exam abscess appears to be significantly enlarged and when palpated does drain a bit into the nose.  Discussed with ENT, requested transfer to [facility] for evaluation and consideration of operative intervention.  Infectious disease also will consult at [facility].  Consultants ID ENT Dr Jearld Fenton  Procedures

## 2023-09-22 NOTE — TOC Initial Note (Signed)
Transition of Care [facility]) - Initial/Assessment Note    Patient Details  Name: Pedro Owens MRN: [id number] Date of Birth: [birth year]  [facility] CM/SW Contact:    Lanier Clam, RN Phone Number: 09/22/2023, 3:14 PM  Clinical Narrative:  d/c plan home                   Barriers to Discharge: Continued Medical Work up   Patient Goals and CMS Choice Patient states their goals for this hospitalization and ongoing recovery are:: Home          Expected Discharge Plan and Services                                              Prior Living Arrangements/Services                       Activities of Daily Living   ADL Screening (condition at time of admission) Independently performs ADLs?: Yes (appropriate for developmental age) Is the patient deaf or have difficulty hearing?: No Does the patient have difficulty seeing, even when wearing glasses/contacts?: No Does the patient have difficulty concentrating, remembering, or making decisions?: No  Permission Sought/Granted                  Emotional Assessment              Admission diagnosis:  Facial cellulitis [L03.211] Patient Active Problem List   Diagnosis Date Noted   Facial abscess 09/22/2023   Facial cellulitis 09/21/2023   Syphilis 03/26/2023   Multifocal pneumonia 08/18/2022   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   Genital herpes 12/14/2019   Severe recurrent major depression w/psychotic features, mood-congruent (HCC) 10/04/2018   Transgender    Pleural effusion, bacterial 11/23/2017   Loculated pleural effusion 11/23/2017   Chronic hepatitis C without hepatic coma (HCC) 11/19/2017   Septic pulmonary embolism (HCC) 11/19/2017   IVDU (intravenous drug user)    Endocarditis, suspected 11/18/2017   HIV disease (HCC) 04/07/2016   Cigarette smoker 04/07/2016   Polysubstance abuse (HCC) 04/07/2016   Depression 08/14/2015   Conversion disorder 08/14/2015   Hemorrhoids,  internal, with bleeding 08/25/2011   Anal warts 08/25/2011   ANXIETY 09/24/2010   ADHD 09/24/2010   PCP:  Oneita Hurt, No Pharmacy:   [facility] - [place], [place] - [address] [place] [address] Phone: [phone] Fax: [phone]     Social Determinants of Health (SDOH) Social History: SDOH Screenings   Food Insecurity: No Food Insecurity (09/21/2023)  Housing: Low Risk  (09/21/2023)  Transportation Needs: No Transportation Needs (09/21/2023)  Utilities: Not At Risk (09/21/2023)  Alcohol Screen: Low Risk  (10/04/2018)  Depression (PHQ2-9): Low Risk  (05/06/2023)  Tobacco Use: High Risk (09/21/2023)   SDOH Interventions:     Readmission Risk Interventions    04/01/2023    4:22 PM  Readmission Risk Prevention Plan  Post Dischage Appt Complete  Medication Screening Complete  Transportation Screening Complete

## 2023-09-22 NOTE — Consult Note (Signed)
[facility]    Date of Admission:  09/21/2023   Total days of inpatient antibiotics 1        Reason for Consult: Facial cellulitis with abscess    Principal Problem:   Facial abscess Active Problems:   HIV disease (HCC)   Cigarette smoker   Polysubstance abuse (HCC)   Chronic hepatitis C without hepatic coma (HCC)   Facial cellulitis   Assessment: 32 year old male with HIV on Biktarvy, viral load 26 on 05/06/2023, chronic hepatitis C, history of neurosyphilis finished a 14-day treatment as well as 3 weekly injection for late latent syphilis, male with facial edema #Facial cellulitis with possible abscess #HIV well-controlled viral load 26, CD4 597 on Biktarvy - Patient developed facial swelling after popping a pimple a couple days ago. - Do not think HIV by risk in the setting of cellulitis as HIV still controlled with CD4 597(03/29/23). -ENT engaged to plan to transfer to [facility] Recommendations:  -Add Unasyn - Continue vancomycin - Blood cultures - Please obtain cultures in the OR -Will get updated  CD4 and viral load as well  #History of neurosyphilis status post 14 days penicillin then 3 weekly injection Bicillin - RPR  #Chronic hepatitis C - Seen in ID clinic for chronic hepatitis C looks, labs were done.  Patient said he no showed his follow-up appointment.  He denies prior treatment of hepatitis C. Microbiology:   Antibiotics: Vancomycin  Cultures: Blood  Urine  Other   HPI: Pedro Owens is a 32 y.o. male with history of HIV, hepatitis C, herpes, syphilis, ADHD presented to ED with facial swelling after popping a pimple right nostril developing stated facial edema.  Admitted for further workup.  On arrival, patient afebrile, WBC 10.9k.  CT showed small area of local density with peripheral enhancement and paralyzed.  Tissue measuring 5X 13X 7 mm could be a small abscess.  Cellulitis right greater than left facial  tissue.  Dental caries Case was discussed with ENT would recommend transfer to [facility].. Review of Systems: Review of Systems  All other systems reviewed and are negative.   Past Medical History:  Diagnosis Date   ADHD (attention deficit hyperactivity disorder)    Anal warts    Anxiety    Asthma    Bipolar disorder (HCC)    Chronic bronchitis (HCC)    Chronic pain syndrome    Convulsions (HCC) 08/14/2015   Depression    Gonorrhea 09/08/11   Headache    "weekly" (11/06/2016)   Hemorrhoids, internal, with bleeding 08/25/2011   Hepatitis C    Herpes    HIV infection (HCC)    Hypertension    ILEITIS 09/24/2010   Qualifier: Diagnosis of  By: Candice Camp CMA (AAMA), Dottie     Lymphoid hyperplasia, reactive    Marijuana abuse    Migraine    "monthly" (11/06/2016)   Pseudoseizures    Seizures (HCC)    "don't know why I have them" (11/06/2016)   Syphilis     Social History   Tobacco Use   Smoking status: Every Day    Current packs/day: 1.50    Average packs/day: 1.5 packs/day for 11.0 years (16.5 ttl pk-yrs)    Types: Cigarettes   Smokeless tobacco: Never  Vaping Use   Vaping status: Never Used  Substance Use Topics   Alcohol use: Yes    Comment: occ   Drug use: Not Currently  Frequency: 2.0 times per week    Types: Cocaine, Benzodiazepines, Oxycodone, Marijuana, Heroin    Comment: "just marijuana"     Family History  Problem Relation Age of Onset   Diabetes Mother    Irritable bowel syndrome Mother    Hypertension Mother    Hyperlipidemia Mother    Hypothyroidism Mother    Diabetes Maternal Uncle    Heart disease Maternal Grandmother    Leukemia Other        maternal greatgrandmother   Colon cancer Neg Hx    Scheduled Meds:  bictegravir-emtricitabine-tenofovir AF  1 tablet Oral Daily   enoxaparin (LOVENOX) injection  40 mg Subcutaneous Q24H   Continuous Infusions:  ampicillin-sulbactam (UNASYN) IV     vancomycin 1,500 mg (09/22/23 1021)   PRN  Meds:.nicotine, ondansetron **OR** ondansetron (ZOFRAN) IV, oxyCODONE, traMADol Allergies  Allergen Reactions   Acetaminophen Diarrhea, Nausea And Vomiting and Other (See Comments)    Flares up crohns disease    Other Other (See Comments)    IV CONTRAST    Vicodin [Hydrocodone-Acetaminophen] Nausea And Vomiting   Dilaudid [Hydromorphone Hcl] Hives and Rash    OBJECTIVE: Blood pressure (!) 142/91, pulse (!) 58, temperature 98 F (36.7 C), temperature source Oral, resp. rate 16, height 5\' 11"  (1.803 m), weight 74.8 kg, SpO2 100%.  Physical Exam Constitutional:      General: He is not in acute distress.    Appearance: He is normal weight. He is not toxic-appearing.  HENT:     Head: Normocephalic and atraumatic.     Right Ear: External ear normal.     Left Ear: External ear normal.     Nose: No congestion or rhinorrhea.     Mouth/Throat:     Mouth: Mucous membranes are moist.     Pharynx: Oropharynx is clear.  Eyes:     Extraocular Movements: Extraocular movements intact.     Conjunctiva/sclera: Conjunctivae normal.     Pupils: Pupils are equal, round, and reactive to light.  Cardiovascular:     Rate and Rhythm: Normal rate and regular rhythm.     Heart sounds: No murmur heard.    No friction rub. No gallop.  Pulmonary:     Effort: Pulmonary effort is normal.     Breath sounds: Normal breath sounds.  Abdominal:     General: Abdomen is flat. Bowel sounds are normal.     Palpations: Abdomen is soft.  Musculoskeletal:        General: No swelling. Normal range of motion.     Cervical back: Normal range of motion and neck supple.  Skin:    General: Skin is warm and dry.  Neurological:     General: No focal deficit present.     Mental Status: He is oriented to person, place, and time.  Psychiatric:        Mood and Affect: Mood normal.     Lab Results Lab Results  Component Value Date   WBC 10.9 (H) 09/21/2023   HGB 17.4 (H) 09/21/2023   HCT 53.2 (H) 09/21/2023    MCV 89.4 09/21/2023   PLT 348 09/21/2023    Lab Results  Component Value Date   CREATININE 0.86 09/21/2023   BUN 18 09/21/2023   NA 136 09/21/2023   K 4.7 09/21/2023   CL 103 09/21/2023   CO2 24 09/21/2023    Lab Results  Component Value Date   ALT 67 (H) 05/06/2023   AST 56 (H) 03/28/2023   GGT 323 (  H) 05/06/2023   ALKPHOS 163 (H) 03/28/2023   BILITOT 0.3 03/28/2023       Danelle Earthly, MD [facility] 09/22/2023, 2:11 PM I have personally spent 82 minutes involved in face-to-face and non-face-to-face activities for this patient on the day of the visit. Professional time spent includes the following activities: Preparing to see the patient (review of tests), Obtaining and/or reviewing separately obtained history (admission/discharge record), Performing a medically appropriate examination and/or evaluation , Ordering medications/tests/procedures, referring and communicating with other health care professionals, Documenting clinical information in the EMR, Independently interpreting results (not separately reported), Communicating results to the patient/family/caregiver, Counseling and educating the patient/family/caregiver and Care coordination (not separately reported).

## 2023-09-23 ENCOUNTER — Other Ambulatory Visit (HOSPITAL_COMMUNITY): Payer: Self-pay

## 2023-09-23 DIAGNOSIS — L03211 Cellulitis of face: Secondary | ICD-10-CM | POA: Diagnosis not present

## 2023-09-23 DIAGNOSIS — B20 Human immunodeficiency virus [HIV] disease: Secondary | ICD-10-CM | POA: Diagnosis not present

## 2023-09-23 DIAGNOSIS — B182 Chronic viral hepatitis C: Secondary | ICD-10-CM | POA: Diagnosis not present

## 2023-09-23 DIAGNOSIS — L0201 Cutaneous abscess of face: Secondary | ICD-10-CM | POA: Diagnosis not present

## 2023-09-23 LAB — BASIC METABOLIC PANEL
Anion gap: 8 (ref 5–15)
BUN: 20 mg/dL (ref 6–20)
CO2: 28 mmol/L (ref 22–32)
Calcium: 8.8 mg/dL — ABNORMAL LOW (ref 8.9–10.3)
Chloride: 101 mmol/L (ref 98–111)
Creatinine, Ser: 0.92 mg/dL (ref 0.61–1.24)
GFR, Estimated: >60 mL/min (ref >60)
Glucose, Bld: 88 mg/dL (ref 70–99)
Potassium: 3.9 mmol/L (ref 3.5–5.1)
Sodium: 137 mmol/L (ref 135–145)

## 2023-09-23 LAB — RPR
RPR Ser Ql: REACTIVE — AB
RPR Titer: 1:16 {titer}

## 2023-09-23 LAB — CBC
HCT: 43 % (ref 39.0–52.0)
Hemoglobin: 14.6 g/dL (ref 13.0–17.0)
MCH: 29.7 pg (ref 26.0–34.0)
MCHC: 34 g/dL (ref 30.0–36.0)
MCV: 87.4 fL (ref 80.0–100.0)
Platelets: 317 10*3/uL (ref 150–400)
RBC: 4.92 MIL/uL (ref 4.22–5.81)
RDW: 13 % (ref 11.5–15.5)
WBC: 7.8 10*3/uL (ref 4.0–10.5)
nRBC: 0 % (ref 0.0–0.2)

## 2023-09-23 LAB — HIV-1 RNA QUANT-NO REFLEX-BLD
HIV 1 RNA Quant: 60 {copies}/mL
LOG10 HIV-1 RNA: 1.778 {Log_copies}/mL

## 2023-09-23 LAB — T-HELPER CELLS (CD4) COUNT (NOT AT ARMC)
CD4 % Helper T Cell: 32 % — ABNORMAL LOW (ref 33–65)
CD4 T Cell Abs: 475 /uL (ref 400–1790)

## 2023-09-23 MED ORDER — CHLORHEXIDINE GLUCONATE CLOTH 2 % EX PADS
6.0000 | MEDICATED_PAD | Freq: Every day | CUTANEOUS | Status: DC
  Administered 2023-09-23 – 2023-09-25 (×2): 6 via TOPICAL

## 2023-09-23 MED ORDER — DOXYCYCLINE HYCLATE 100 MG PO TABS
100.0000 mg | ORAL_TABLET | Freq: Two times a day (BID) | ORAL | Status: DC
  Administered 2023-09-23 – 2023-09-25 (×5): 100 mg via ORAL
  Filled 2023-09-23 (×5): qty 1

## 2023-09-23 MED ORDER — HYDROXYZINE HCL 25 MG PO TABS
25.0000 mg | ORAL_TABLET | Freq: Three times a day (TID) | ORAL | Status: DC | PRN
  Administered 2023-09-23 – 2023-09-25 (×4): 25 mg via ORAL
  Filled 2023-09-23 (×4): qty 1

## 2023-09-23 MED ORDER — BICTEGRAVIR-EMTRICITAB-TENOFOV 50-200-25 MG PO TABS
1.0000 | ORAL_TABLET | Freq: Every day | ORAL | 0 refills | Status: DC
  Filled 2023-09-23: qty 30, 30d supply, fill #0

## 2023-09-23 MED ORDER — PHENAZOPYRIDINE HCL 200 MG PO TABS: 200.0000 mg | ORAL_TABLET | Freq: Four times a day (QID) | ORAL | Status: DC | PRN

## 2023-09-23 MED ORDER — MUPIROCIN 2 % EX OINT
1.0000 | TOPICAL_OINTMENT | Freq: Two times a day (BID) | CUTANEOUS | Status: DC
  Administered 2023-09-23 – 2023-09-25 (×4): 1 via NASAL
  Filled 2023-09-23 (×2): qty 22

## 2023-09-23 NOTE — Plan of Care (Signed)
  Problem: Education: Goal: Knowledge of General Education information will improve Description: Including pain rating scale, medication(s)/side effects and non-pharmacologic comfort measures Outcome: Progressing   Problem: Health Behavior/Discharge Planning: Goal: Ability to manage health-related needs will improve Outcome: Progressing   Problem: Clinical Measurements: Goal: Ability to maintain clinical measurements within normal limits will improve Outcome: Progressing Goal: Will remain free from infection Outcome: Progressing Goal: Diagnostic test results will improve Outcome: Progressing Goal: Respiratory complications will improve Outcome: Progressing Goal: Cardiovascular complication will be avoided Outcome: Progressing   Problem: Activity: Goal: Risk for activity intolerance will decrease Outcome: Progressing   Problem: Nutrition: Goal: Adequate nutrition will be maintained Outcome: Progressing   Problem: Coping: Goal: Level of anxiety will decrease Outcome: Progressing   Problem: Elimination: Goal: Will not experience complications related to bowel motility Outcome: Progressing Goal: Will not experience complications related to urinary retention Outcome: Progressing   Problem: Pain Management: Goal: General experience of comfort will improve Outcome: Progressing   Problem: Safety: Goal: Ability to remain free from injury will improve Outcome: Progressing   Problem: Skin Integrity: Goal: Risk for impaired skin integrity will decrease Outcome: Progressing   Problem: Clinical Measurements: Goal: Ability to avoid or minimize complications of infection will improve Outcome: Progressing   Problem: Skin Integrity: Goal: Skin integrity will improve Outcome: Progressing

## 2023-09-23 NOTE — Progress Notes (Signed)
ENT Progress Note  32 yoM hx of HIV (last CD4 475), here for swelling and pain at the right nasal sill which began as a pimple 6 days ago, and after he popped it he developed swelling redness and pain around the site. Started on Vanco last night when admitted to [facility] yesterday. Hx of skin cellulitis per report.   Records Reviewed:  ID progress note for today A/P Facial abscess - positive MRSA pcr but unclear without cultures to guide.  On broad coverage and will continue with doxycycline with amp/sulbactam and anticipate oral doxy + Augmentin for home, pending I and D.   HIV - has some intermittent non-compliance but CD4 475.   On treatment and will continue Has follow up    Past Medical History:  Diagnosis Date   ADHD (attention deficit hyperactivity disorder)    Anal warts    Anxiety    Asthma    Bipolar disorder (HCC)    Chronic bronchitis (HCC)    Chronic pain syndrome    Convulsions (HCC) 08/14/2015   Depression    Gonorrhea 09/08/11   Headache    "weekly" (11/06/2016)   Hemorrhoids, internal, with bleeding 08/25/2011   Hepatitis C    Herpes    HIV infection (HCC)    Hypertension    ILEITIS 09/24/2010   Qualifier: Diagnosis of  By: Candice Camp CMA (AAMA), Dottie     Lymphoid hyperplasia, reactive    Marijuana abuse    Migraine    "monthly" (11/06/2016)   Pseudoseizures    Seizures (HCC)    "don't know why I have them" (11/06/2016)   Syphilis     Past Surgical History:  Procedure Laterality Date   ANKLE SURGERY Bilateral 2005   Screw & Pins   ANKLE SURGERY Right 2008   "replaced pins & screws"   FOOT SURGERY Bilateral 2005   "bone spurs"   I & D EXTREMITY Right 06/26/2016   Procedure: IRRIGATION AND DEBRIDEMENT RIGHT FOREARM;  Surgeon: Betha Loa, MD;  Location: [facility] OR;  Service: Orthopedics;  Laterality: Right;   I & D EXTREMITY Right 11/04/2016   Procedure: IRRIGATION AND DEBRIDEMENT EXTREMITY;  Surgeon: Dairl Ponder, MD;  Location: [facility] OR;  Service:  Orthopedics;  Laterality: Right;   I & D EXTREMITY Right 11/06/2016   Procedure: IRRIGATION AND DEBRIDEMENT right forarm;  Surgeon: Dairl Ponder, MD;  Location: [facility] OR;  Service: Orthopedics;  Laterality: Right;   TEE WITHOUT CARDIOVERSION N/A 11/19/2017   Procedure: TRANSESOPHAGEAL ECHOCARDIOGRAM (TEE);  Surgeon: Pricilla Riffle, MD;  Location: [facility] ENDOSCOPY;  Service: Cardiovascular;  Laterality: N/A;   VIDEO ASSISTED THORACOSCOPY (VATS)/EMPYEMA Left 11/26/2017   Procedure: VIDEO ASSISTED THORACOSCOPY (VATS)/DRAIN EMPYEMA;  Surgeon: Kerin Perna, MD;  Location: [facility] OR;  Service: Thoracic;  Laterality: Left;    Family History  Problem Relation Age of Onset   Diabetes Mother    Irritable bowel syndrome Mother    Hypertension Mother    Hyperlipidemia Mother    Hypothyroidism Mother    Diabetes Maternal Uncle    Heart disease Maternal Grandmother    Leukemia Other        maternal greatgrandmother   Colon cancer Neg Hx     Social History:  reports that he has been smoking cigarettes. He has a 16.5 pack-year smoking history. He has never used smokeless tobacco. He reports current alcohol use. He reports that he does not currently use drugs after having used the following drugs: Cocaine, Benzodiazepines, Oxycodone, Marijuana, and Heroin.  Frequency: 2.00 times per week.  Allergies:  Allergies  Allergen Reactions   Acetaminophen Diarrhea, Nausea And Vomiting and Other (See Comments)    Flares up crohns disease    Other Other (See Comments)    IV CONTRAST    Vicodin [Hydrocodone-Acetaminophen] Nausea And Vomiting   Dilaudid [Hydromorphone Hcl] Hives and Rash    Medications: I have reviewed the patient's current medications.  The PMH, PSH, Medications, Allergies, and SH were reviewed and updated.  ROS: Constitutional: Negative for fever, weight loss and weight gain. Cardiovascular: Negative for chest pain and dyspnea on exertion. Respiratory: Is not experiencing shortness of breath  at rest. Gastrointestinal: Negative for nausea and vomiting. Neurological: Negative for headaches. Psychiatric: The patient is not nervous/anxious  Blood pressure 129/84, pulse (!) 54, temperature 97.6 F (36.4 C), temperature source Oral, resp. rate 18, height 5\' 11"  (1.803 m), weight 74.8 kg, SpO2 100%.  PHYSICAL EXAM:  Exam: General: Well-developed, well-nourished Respiratory Respiratory effort: Equal inspiration and expiration without stridor Cardiovascular Peripheral Vascular: Warm extremities with equal color/perfusion Eyes: No nystagmus with equal extraocular motion bilaterally Neuro/Psych/Balance: Patient oriented to person, place, and time; Appropriate mood and affect; Cranial nerves II-XII are intact Head and Face Inspection: Normocephalic and atraumatic without mass or lesion Area of induration and swelling along the right nasal sill approximately 1-2 cm, no fluctuance felt on exam tenderness on exam Facial Strength: Facial motility symmetric and full bilaterally ENT Pinna: External ear intact and fully developed External Nose: No scar or anatomic deformity Internal Nose: Septum is intact. Bilateral inferior turbinate hypertrophy.  Lips, Teeth, and gums: Mucosa and teeth intact and viable Oral cavity/oropharynx: No erythema or exudate, no lesions present Neck Neck and Trachea: Midline trachea without mass or lesion   Procedure: none  Studies Reviewed: CT max/face 09/21/23 1. Small area of low density with peripheral enhancement in the right inferior paranasal soft tissues, which measures 5 x 13 x 7 mm, which may represent a small abscess. 2. Inflammatory stranding in the right greater than left facial soft tissues, which may represent cellulitis. 3. Dental caries in the bilateral second premolars.  Assessment/Plan: Facial cellulitis  Possible collection which is 7 mm in largest diameter on CT max/face - scan reviewed - currently not amenable to I&D due to small  size  Continue broad spectrum IV abx per ID Continue pain control  ENT will follow while here and re-assess the need for I&D after 24-48 hrs, might need repeat imaging to determine if there is a formed collection first and its size   Thank you for allowing me to participate in the care of this patient. Please do not hesitate to contact me with any questions or concerns.   Ashok Croon, MD Otol[facility]ology [facility] Phone: [phone] Fax: [phone]    09/23/2023, 4:20 PM

## 2023-09-23 NOTE — TOC Initial Note (Addendum)
Transition of Care (TOC) - Initial/Assessment Note   Spoke to patient at bedside. Patient from home with his mother.   Confirmed face sheet information.   Patient sees Dr Anne Hahn for ID, but does not have a PCP. Patient agreeable to a follow up appointment at a [facility] . Will schedule closer to discharge.   Patient currently on IV ABX and may need an I and D   Per notes anticipated discharge date 1 to 2 days   Internal Medicine at [facility] not accepting new patients. [facility], [facility] , and [facility] have no available appointments. [facility]  and scheduled appointment . Information on AVS                     Expected Discharge Plan: Home/Self Care Barriers to Discharge: Continued Medical Work up   Patient Goals and CMS Choice Patient states their goals for this hospitalization and ongoing recovery are:: to return to home   Choice offered to / list presented to : NA      Expected Discharge Plan and Services   Discharge Planning Services: CM Consult Post Acute Care Choice: NA Living arrangements for the past 2 months: Single Family Home                 DME Arranged: N/A DME Agency: NA       HH Arranged: NA          Prior Living Arrangements/Services Living arrangements for the past 2 months: Single Family Home Lives with:: Parents Patient language and need for interpreter reviewed:: Yes Do you feel safe going back to the place where you live?: Yes      Need for Family Participation in Patient Care: Yes (Comment) Care giver support system in place?: Yes (comment)   Criminal Activity/Legal Involvement Pertinent to Current Situation/Hospitalization: No - Comment as needed  Activities of Daily Living   ADL Screening (condition at time of admission) Independently performs ADLs?: Yes (appropriate for developmental age) Is the patient deaf or have difficulty hearing?: No Does the patient  have difficulty seeing, even when wearing glasses/contacts?: No Does the patient have difficulty concentrating, remembering, or making decisions?: No  Permission Sought/Granted   Permission granted to share information with : No              Emotional Assessment Appearance:: Appears stated age Attitude/Demeanor/Rapport: Engaged Affect (typically observed): Accepting Orientation: : Oriented to Self, Oriented to Place, Oriented to  Time, Oriented to Situation   Psych Involvement: No (comment)  Admission diagnosis:  Facial cellulitis [L03.211] Patient Active Problem List   Diagnosis Date Noted   Facial abscess 09/22/2023   Facial cellulitis 09/21/2023   Syphilis 03/26/2023   Multifocal pneumonia 08/18/2022   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   Genital herpes 12/14/2019   Severe recurrent major depression w/psychotic features, mood-congruent (HCC) 10/04/2018   Transgender    Pleural effusion, bacterial 11/23/2017   Loculated pleural effusion 11/23/2017   Chronic hepatitis C without hepatic coma (HCC) 11/19/2017   Septic pulmonary embolism (HCC) 11/19/2017   IVDU (intravenous drug user)    Endocarditis, suspected 11/18/2017   HIV disease (HCC) 04/07/2016   Cigarette smoker 04/07/2016   Polysubstance abuse (HCC) 04/07/2016   Depression 08/14/2015   Conversion disorder 08/14/2015   Hemorrhoids, internal, with bleeding 08/25/2011   Anal warts 08/25/2011   ANXIETY 09/24/2010   ADHD 09/24/2010   PCP:  Pcp, No Pharmacy:  [facility] - [place], [place] - [address] [place] [address] Phone: [phone] Fax: [phone]  [facility] [address] [place] [address] Phone: [phone] Fax: [phone]     Social Determinants of Health (SDOH) Social History: SDOH Screenings   Food Insecurity: No Food Insecurity (09/22/2023)  Housing: Low Risk  (09/22/2023)  Transportation Needs: No Transportation  Needs (09/22/2023)  Utilities: Not At Risk (09/22/2023)  Alcohol Screen: Low Risk  (10/04/2018)  Depression (PHQ2-9): Low Risk  (05/06/2023)  Tobacco Use: High Risk (09/21/2023)   SDOH Interventions:     Readmission Risk Interventions    04/01/2023    4:22 PM  Readmission Risk Prevention Plan  Post Dischage Appt Complete  Medication Screening Complete  Transportation Screening Complete

## 2023-09-23 NOTE — Progress Notes (Signed)
[facility]  Date of Admission:  09/21/2023      Total days of antibiotics 2   Vancomycin  Unasyn           ASSESSMENT: Pedro Owens is a 32 y.o. male admitted with:   Facial Abscess / Abscess -  ENT following, no I&D planned at this time. MRSA PCR + with leaking IV and quick resolution of leukocytosis will transition to oral doxycycline 100 mg PO BID. Continue Unasyn for now. While he does have HIV his CD4 of 475 does not put him at any risk for any unusual pathogens here. May be ready to convert to total oral regimen after another day of abx and ENT re-eval  -Change vancomycin to Doxycycline 100 mg bid po with food  -continue unasyn IV for now -Leukocytosis resolved - if no indications to drain and pain is reasonably controlled may be able to switch to orals and plan for community follow up tomorrow.  -OP ID FU scheduled with Dr. Drue Second on Wednesday 12/11 @ 10: 45 am - his mother will drive him.    HIV -  Continue biktarvy once daily. Given challenges with getting to clinic I was candid with him and explained that there is more risk a/w interrupted doses of Cabenuva and would recommend to wait on that consideration to lessen the risk of drug resistance.  -HIV RNA and CD4 pending  -Continue biktarvy everday  -Will help with rx to bedside to bridge until he can get another refill from pharmacy in the community  -FU with Dr. Drue Second arranged.    H/O Neurosyphilis -  Titer dropped appropriately (1:1024 --> 1:16) indicating successful treatment. Discussed this today.  -nothing further to do outside of re-screening. May consider doxy pep for him in the future, though may make treatment of potential SSTIs in the future ineffective with tetracyclines.    Hepatitis C, GT 1a, Untreated -  F3 on fibrotest in July which is a bit more advanced than I would expect. Will get him back to clinic to continue further work up. Needs liver U/S to follow up  (not scheduled)  -Coordinate outpatient eval/treatment with Dr. Drue Second.    PLAN: Change vancomycin to doxycycline PO 100 mg BID with meals  Continue IV Unasyn  FU HIV VL, CD4 OP follow up for chronic hep c and HIV 12/11  Principal Problem:   Facial abscess Active Problems:   HIV disease (HCC)   Cigarette smoker   Polysubstance abuse (HCC)   Chronic hepatitis C without hepatic coma (HCC)   Facial cellulitis    bictegravir-emtricitabine-tenofovir AF  1 tablet Oral Daily   doxycycline  100 mg Oral BID   enoxaparin (LOVENOX) injection  40 mg Subcutaneous Q24H    SUBJECTIVE: Still sore and tender under the right noses. Some drainage. Still swollen. Eating OK No fevers or chills.   Asking about missed ID appointment, wanting to consider cabenuva again but has missed appt x 2 for first dose from description. Back living with mom now (did not talk for a period of time and was out of the house which caused a few months of medication lapse). Has been back on biktarvy for 24m since last fill on 11/1. Has only 2 pills left at home.  Interested in RPR titers and if they look better.    Review of Systems: Review of Systems  Constitutional:  Negative for chills and fever.  Respiratory: Negative.  Cardiovascular: Negative.   Genitourinary: Negative.   Musculoskeletal: Negative.   Skin:  Negative for rash.  Neurological: Negative.   Psychiatric/Behavioral: Negative.       Allergies  Allergen Reactions   Acetaminophen Diarrhea, Nausea And Vomiting and Other (See Comments)    Flares up crohns disease    Other Other (See Comments)    IV CONTRAST    Vicodin [Hydrocodone-Acetaminophen] Nausea And Vomiting   Dilaudid [Hydromorphone Hcl] Hives and Rash    OBJECTIVE: Vitals:   09/22/23 0435 09/22/23 1330 09/22/23 1916 09/23/23 0830  BP: 130/88 (!) 142/91 (!) 135/91 129/84  Pulse: (!) 56 (!) 58 (!) 59 (!) 54  Resp: 16  18 18   Temp: 97.7 F (36.5 C) 98 F (36.7 C) 98.5 F (36.9  C) 97.6 F (36.4 C)  TempSrc: Oral Oral  Oral  SpO2: 99% 100% 97% 100%  Weight:      Height:       Body mass index is 23 kg/m.  Physical Exam Vitals reviewed.  Constitutional:      Appearance: Normal appearance. He is not ill-appearing.  HENT:     Nose:     Comments: Abscess noted under the right nare. Still with some swelling and tenderness. Can open mouth well.  Eyes:     Extraocular Movements: Extraocular movements intact.     Pupils: Pupils are equal, round, and reactive to light.  Cardiovascular:     Rate and Rhythm: Normal rate and regular rhythm.  Pulmonary:     Effort: Pulmonary effort is normal. No respiratory distress.     Breath sounds: Normal breath sounds.  Abdominal:     General: Bowel sounds are normal. There is no distension.     Palpations: Abdomen is soft.  Musculoskeletal:     Comments: Rt FA PIV leaking  Skin:    General: Skin is warm and dry.  Neurological:     Mental Status: He is alert.     Lab Results Lab Results  Component Value Date   WBC 7.8 09/23/2023   HGB 14.6 09/23/2023   HCT 43.0 09/23/2023   MCV 87.4 09/23/2023   PLT 317 09/23/2023    Lab Results  Component Value Date   CREATININE 0.92 09/23/2023   BUN 20 09/23/2023   NA 137 09/23/2023   K 3.9 09/23/2023   CL 101 09/23/2023   CO2 28 09/23/2023    Lab Results  Component Value Date   ALT 67 (H) 05/06/2023   AST 56 (H) 03/28/2023   GGT 323 (H) 05/06/2023   ALKPHOS 163 (H) 03/28/2023   BILITOT 0.3 03/28/2023     Microbiology: Recent Results (from the past 240 hour(s))  MRSA Next Gen by PCR, Nasal     Status: Abnormal   Collection Time: 09/21/23  5:44 PM   Specimen: Nasal Mucosa; Nasal Swab  Result Value Ref Range Status   MRSA by PCR Next Gen DETECTED (A) NOT DETECTED Final    Comment: RESULT CALLED TO, READ BACK BY AND VERIFIED WITH: GULAPPA RN @ [phone] ON 09/22/2023 BY MTA (NOTE) The GeneXpert MRSA Assay (FDA approved for NASAL specimens only), is one component of  a comprehensive MRSA colonization surveillance program. It is not intended to diagnose MRSA infection nor to guide or monitor treatment for MRSA infections. Test performance is not FDA approved in patients less than 57 years old. Performed at [facility], [address] [place], [place] [address]   Culture, blood (Routine X 2) w  Reflex to ID Panel     Status: None (Preliminary result)   Collection Time: 09/22/23  2:55 PM   Specimen: BLOOD RIGHT ARM  Result Value Ref Range Status   Specimen Description   Final    BLOOD RIGHT ARM Performed at [facility], [address], [place], [place] [address]    Special Requests   Final    BOTTLES DRAWN AEROBIC AND ANAEROBIC Blood Culture results may not be optimal due to an inadequate volume of blood received in culture bottles Performed at [facility], [address] [place], [place] [address]    Culture   Final    NO GROWTH < 24 HOURS Performed at [facility], [address] [place], [place] [address]    Report Status PENDING  Incomplete  Culture, blood (Routine X 2) w Reflex to ID Panel     Status: None (Preliminary result)   Collection Time: 09/22/23  2:55 PM   Specimen: BLOOD LEFT ARM  Result Value Ref Range Status   Specimen Description   Final    BLOOD LEFT ARM Performed at [facility], [address], [place], [place] [address]    Special Requests   Final    BOTTLES DRAWN AEROBIC AND ANAEROBIC Blood Culture results may not be optimal due to an inadequate volume of blood received in culture bottles Performed at [facility], [address] [place], [place] [address]    Culture   Final    NO GROWTH < 24 HOURS Performed at [facility], [address], [place], [place] [address]    Report Status PENDING  Incomplete    Rexene Alberts, MSN, NP-C [facility]   [email or web address] Pager: [phone] Office: [phone] [facility] Main Line: [phone] *Secure Chat Communication Welcome

## 2023-09-23 NOTE — TOC Benefit Eligibility Note (Signed)
Pharmacy Patient Advocate Encounter  Insurance verification completed.    The patient is insured through [facility] MEDICAID   Ran test claim for [employer]. Currently a quantity of 30 is a 30 day supply and the co-pay is $0.00 .   This test claim was processed through [facility]- copay amounts may vary at other pharmacies due to pharmacy/plan contracts, or as the patient moves through the different stages of their insurance plan.

## 2023-09-23 NOTE — Progress Notes (Signed)
PROGRESS NOTE  Pedro Owens  EXB:[id number] DOB: 1991-09-29 DOA: 09/21/2023 PCP: Pcp, No   Brief Narrative: Patient is a 32 year old male with history of hepatitis C, HIV, herpes, syphilis, ADHD who presented with facial swelling, erythema after popping a pimple on the right nostril.  He was admitted for the management of right facial cellulitis with a small paranasal abscess.  Case discussed with ENT, transferred to [facility] for possible operative intervention.  ID also consulted and following.  Current plan is to continue conservative management with antibiotics.   Assessment & Plan:  Principal Problem:   Facial abscess Active Problems:   HIV disease (HCC)   Cigarette smoker   Polysubstance abuse (HCC)   Chronic hepatitis C without hepatic coma (HCC)   Facial cellulitis  Right facial cellulitis: Presented with presented with facial swelling, erythema after popping a pimple on the right nostril.  Also found to have a small paranasal abscess.  Currently on broad-spectrum antibiotics.  ID and ENT following.  On vancomycin and Unasyn.  Follow-up cultures: No growth till date.  HIV: On Biktarvy.  He has history of neurosyphilis, treated with penicillin.  Polysubstance abuse: He says he is not currently using any drugs.  Chronic hepatitis C: We recommend to follow-up with ID as an outpatient  Tobacco use: We counseled for cessation  Itching: Continue Atarax         DVT prophylaxis:enoxaparin (LOVENOX) injection 40 mg Start: 09/21/23 2200     Code Status: Full Code  Family Communication: None at the bedside  Patient status:Inpatient  Patient is from :Home  Anticipated discharge NU:UVOZ  Estimated DC date:1-2 days   Consultants:ID,ENT  Procedures:None  Antimicrobials:  Anti-infectives (From admission, onward)    Start     Dose/Rate Route Frequency Ordered Stop   09/22/23 1430  Ampicillin-Sulbactam (UNASYN) 3 g in sodium chloride 0.9 % 100 mL IVPB        3  g 200 mL/hr over 30 Minutes Intravenous Every 6 hours 09/22/23 1333     09/22/23 1000  bictegravir-emtricitabine-tenofovir AF (BIKTARVY) 50-200-25 MG per tablet 1 tablet        1 tablet Oral Daily 09/21/23 1624     09/21/23 2200  Vancomycin (VANCOCIN) 1,500 mg in sodium chloride 0.9 % 500 mL IVPB        1,500 mg 250 mL/hr over 120 Minutes Intravenous Every 12 hours 09/21/23 1529     09/21/23 1015  Vancomycin (VANCOCIN) 1,500 mg in sodium chloride 0.9 % 500 mL IVPB        1,500 mg 250 mL/hr over 120 Minutes Intravenous  Once 09/21/23 1006 09/21/23 1245       Subjective: Patient seen and examined at bedside today.  Complains of persistent pain on the right side of the face.  Right side of the face does not appear severely edematous, no drainage.  Some tenderness.  He has severe generalized itching today.  Wants to eat food  Objective: Vitals:   09/21/23 2333 09/22/23 0435 09/22/23 1330 09/22/23 1916  BP:  130/88 (!) 142/91 (!) 135/91  Pulse:  (!) 56 (!) 58 (!) 59  Resp:  16  18  Temp: 98.3 F (36.8 C) 97.7 F (36.5 C) 98 F (36.7 C) 98.5 F (36.9 C)  TempSrc: Oral Oral Oral   SpO2:  99% 100% 97%  Weight:      Height:        Intake/Output Summary (Last 24 hours) at 09/23/2023 3664 Last data filed at 09/22/2023  1636 Gross per 24 hour  Intake 600 ml  Output --  Net 600 ml   Filed Weights   09/21/23 0913  Weight: 74.8 kg    Examination:  General exam: Overall comfortable, not in distress HEENT: Swelling on the right side of the face, tenderness to the right face Respiratory system:  no wheezes or crackles  Cardiovascular system: S1 & S2 heard, RRR.  Gastrointestinal system: Abdomen is nondistended, soft and nontender. Central nervous system: Alert and oriented Extremities: No edema, no clubbing ,no cyanosis Skin: Some excoriations from itching   Data Reviewed: I have personally reviewed following labs and imaging studies  CBC: Recent Labs  Lab 09/21/23 1003   WBC 10.9*  HGB 17.4*  HCT 53.2*  MCV 89.4  PLT 348   Basic Metabolic Panel: Recent Labs  Lab 09/21/23 1003  NA 136  K 4.7  CL 103  CO2 24  GLUCOSE 86  BUN 18  CREATININE 0.86  CALCIUM 9.3     Recent Results (from the past 240 hour(s))  MRSA Next Gen by PCR, Nasal     Status: Abnormal   Collection Time: 09/21/23  5:44 PM   Specimen: Nasal Mucosa; Nasal Swab  Result Value Ref Range Status   MRSA by PCR Next Gen DETECTED (A) NOT DETECTED Final    Comment: RESULT CALLED TO, READ BACK BY AND VERIFIED WITH: GULAPPA RN @ [phone] ON 09/22/2023 BY MTA (NOTE) The GeneXpert MRSA Assay (FDA approved for NASAL specimens only), is one component of a comprehensive MRSA colonization surveillance program. It is not intended to diagnose MRSA infection nor to guide or monitor treatment for MRSA infections. Test performance is not FDA approved in patients less than 61 years old. Performed at [facility], [address], [place], [place] [address]   Culture, blood (Routine X 2) w Reflex to ID Panel     Status: None (Preliminary result)   Collection Time: 09/22/23  2:55 PM   Specimen: BLOOD RIGHT ARM  Result Value Ref Range Status   Specimen Description   Final    BLOOD RIGHT ARM Performed at [facility], [address] [place], [place] [address]    Special Requests   Final    BOTTLES DRAWN AEROBIC AND ANAEROBIC Blood Culture results may not be optimal due to an inadequate volume of blood received in culture bottles Performed at [facility], [address] [place], [place] [address]    Culture PENDING  Incomplete   Report Status PENDING  Incomplete  Culture, blood (Routine X 2) w Reflex to ID Panel     Status: None (Preliminary result)   Collection Time: 09/22/23  2:55 PM   Specimen: BLOOD LEFT ARM  Result Value Ref Range Status   Specimen Description   Final    BLOOD LEFT ARM Performed at [facility], [address],  [place], [place] [address]    Special Requests   Final    BOTTLES DRAWN AEROBIC AND ANAEROBIC Blood Culture results may not be optimal due to an inadequate volume of blood received in culture bottles Performed at [facility], [address], [place], [place] [address]    Culture PENDING  Incomplete   Report Status PENDING  Incomplete     Radiology Studies: CT Maxillofacial W Contrast  Result Date: 09/21/2023 CLINICAL DATA:  Facial infection, nasal abscess EXAM: CT MAXILLOFACIAL WITH CONTRAST TECHNIQUE: Multidetector CT imaging of the maxillofacial structures was performed  with intravenous contrast. Multiplanar CT image reconstructions were also generated. RADIATION DOSE REDUCTION: This exam was performed according to the departmental dose-optimization program which includes automated exposure control, adjustment of the mA and/or kV according to patient size and/or use of iterative reconstruction technique. CONTRAST:  75mL OMNIPAQUE IOHEXOL 300 MG/ML  SOLN COMPARISON:  No prior CT maxillofacial available, correlation is made with 03/27/2023 CT head FINDINGS: Osseous: No fracture or mandibular dislocation. No destructive process. Dental caries in the bilateral second premolars. No advanced periapical lucency. Orbits: Negative. No traumatic or inflammatory finding. Sinuses: Minimal mucosal thickening in the left frontal sinus. Otherwise clear paranasal sinuses. The mastoids are well aerated. Soft tissues: No inflammatory stranding in the right greater than left facial soft tissues. Small area of low density with peripheral enhancement in the right inferior paranasal soft tissues, which measures 5 x 13 x 7 mm (AP x TR x CC) (series 3, image 45 and series 7, image 41). Which may represent a small abscess. No other focal collection is seen. Limited intracranial: No significant or unexpected finding. IMPRESSION: 1. Small area of low density with peripheral enhancement in the right inferior  paranasal soft tissues, which measures 5 x 13 x 7 mm, which may represent a small abscess. 2. Inflammatory stranding in the right greater than left facial soft tissues, which may represent cellulitis. 3. Dental caries in the bilateral second premolars. Electronically Signed   By: Wiliam Ke M.D.   On: 09/21/2023 13:42    Scheduled Meds:  bictegravir-emtricitabine-tenofovir AF  1 tablet Oral Daily   enoxaparin (LOVENOX) injection  40 mg Subcutaneous Q24H   Continuous Infusions:  ampicillin-sulbactam (UNASYN) IV 3 g (09/23/23 0215)   vancomycin 1,500 mg (09/23/23 0401)     LOS: 2 days   Burnadette Pop, MD [facility] P12/02/2023, 8:07 AM

## 2023-09-24 ENCOUNTER — Other Ambulatory Visit (HOSPITAL_COMMUNITY): Payer: Self-pay

## 2023-09-24 DIAGNOSIS — B20 Human immunodeficiency virus [HIV] disease: Secondary | ICD-10-CM | POA: Diagnosis not present

## 2023-09-24 DIAGNOSIS — L0201 Cutaneous abscess of face: Secondary | ICD-10-CM | POA: Diagnosis not present

## 2023-09-24 DIAGNOSIS — B182 Chronic viral hepatitis C: Secondary | ICD-10-CM | POA: Diagnosis not present

## 2023-09-24 LAB — URINALYSIS, ROUTINE W REFLEX MICROSCOPIC
Bilirubin Urine: NEGATIVE
Glucose, UA: NEGATIVE mg/dL
Hgb urine dipstick: NEGATIVE
Ketones, ur: NEGATIVE mg/dL
Leukocytes,Ua: NEGATIVE
Nitrite: NEGATIVE
Protein, ur: NEGATIVE mg/dL
Specific Gravity, Urine: 1.015 (ref 1.005–1.030)
pH: 6 (ref 5.0–8.0)

## 2023-09-24 LAB — T.PALLIDUM AB, TOTAL: T Pallidum Abs: REACTIVE — AB

## 2023-09-24 MED ORDER — AMOXICILLIN-POT CLAVULANATE 875-125 MG PO TABS
1.0000 | ORAL_TABLET | Freq: Two times a day (BID) | ORAL | 0 refills | Status: AC
  Filled 2023-09-24: qty 24, 12d supply, fill #0

## 2023-09-24 MED ORDER — MORPHINE SULFATE (PF) 2 MG/ML IV SOLN
2.0000 mg | INTRAVENOUS | Status: DC | PRN
  Administered 2023-09-24 – 2023-09-25 (×4): 2 mg via INTRAVENOUS
  Filled 2023-09-24 (×5): qty 1

## 2023-09-24 MED ORDER — KETOROLAC TROMETHAMINE 15 MG/ML IJ SOLN
15.0000 mg | Freq: Four times a day (QID) | INTRAMUSCULAR | Status: DC | PRN
  Administered 2023-09-24 – 2023-09-25 (×3): 15 mg via INTRAVENOUS
  Filled 2023-09-24 (×3): qty 1

## 2023-09-24 MED ORDER — OXYCODONE HCL 5 MG PO TABS
5.0000 mg | ORAL_TABLET | ORAL | Status: DC | PRN
  Administered 2023-09-24 – 2023-09-25 (×2): 10 mg via ORAL
  Filled 2023-09-24 (×3): qty 2

## 2023-09-24 MED ORDER — DOXYCYCLINE HYCLATE 100 MG PO TABS
100.0000 mg | ORAL_TABLET | Freq: Two times a day (BID) | ORAL | 0 refills | Status: AC
Start: 2023-09-24 — End: 2023-10-07
  Filled 2023-09-24: qty 24, 12d supply, fill #0

## 2023-09-24 NOTE — Progress Notes (Signed)
[facility]  Date of Admission:  09/21/2023      Total days of antibiotics 3   Vancomycin  Unasyn           ASSESSMENT: Pedro Owens is a 32 y.o. male admitted with:   Facial Abscess / Abscess -  ENT following, no I&D planned at this time. MRSA PCR +. No cultures to guide tx. While he does have HIV his CD4 of 475 does not put him at any risk for any unusual pathogens here. Ready for PO therapy and D/C  -switch to oral doxy + augmentin  -OP ID FU scheduled with Dr. Drue Second on Wednesday 12/11 @ 10: 45 am - his mother will drive him.    HIV -  Continue biktarvy once daily. VL only 60 which is undetectable and c/w his report of being back on for at least a month. CD4 > 450  -Continue biktarvy everday  -FU with Dr. Drue Second arranged.    H/O Neurosyphilis -  Titer dropped appropriately (1:1024 --> 1:16) indicating successful treatment. Discussed this today.  -nothing further to do outside of re-screening. May consider doxy pep for him in the future, though may make treatment of potential SSTIs in the future ineffective with tetracyclines.    Hepatitis C, GT 1a, Untreated -  F3 on fibrotest in July which is a bit more advanced than I would expect. Will get him back to clinic to continue further work up. Needs liver U/S to follow up (not scheduled)  -Coordinate outpatient eval/treatment with Dr. Drue Second.    PLAN: Will finish 2 weeks of doxy + augmentin and arrange FU with Dr. Drue Second.  Biktarvy daily    Principal Problem:   Facial abscess Active Problems:   HIV disease (HCC)   Cigarette smoker   Polysubstance abuse (HCC)   Chronic hepatitis C without hepatic coma (HCC)   Facial cellulitis    bictegravir-emtricitabine-tenofovir AF  1 tablet Oral Daily   Chlorhexidine Gluconate Cloth  6 each Topical Q0600   doxycycline  100 mg Oral BID   enoxaparin (LOVENOX) injection  40 mg Subcutaneous Q24H   mupirocin ointment  1 Application Nasal  BID    SUBJECTIVE: Still sore and tender under the right noses. Some drainage. Still swollen. Eating OK No fevers or chills.     Review of Systems: Review of Systems  Constitutional:  Negative for chills and fever.  Respiratory: Negative.    Cardiovascular: Negative.   Genitourinary: Negative.   Musculoskeletal: Negative.   Skin:  Negative for rash.  Neurological: Negative.   Psychiatric/Behavioral: Negative.       Allergies  Allergen Reactions   Acetaminophen Diarrhea, Nausea And Vomiting and Other (See Comments)    Flares up crohns disease    Other Other (See Comments)    IV CONTRAST    Vicodin [Hydrocodone-Acetaminophen] Nausea And Vomiting   Dilaudid [Hydromorphone Hcl] Hives and Rash    OBJECTIVE: Vitals:   09/23/23 0830 09/23/23 1957 09/24/23 0338 09/24/23 0736  BP: 129/84 (!) 142/87 126/87 133/84  Pulse: (!) 54 70 85 73  Resp: 18 16 20 17   Temp: 97.6 F (36.4 C) 98 F (36.7 C) 98.4 F (36.9 C) 99 F (37.2 C)  TempSrc: Oral Oral Oral Oral  SpO2: 100% 100% 100% 100%  Weight:      Height:       Body mass index is 23 kg/m.  Physical Exam Vitals reviewed.  Constitutional:  Appearance: Normal appearance. He is not ill-appearing.  HENT:     Nose:     Comments: Abscess noted under the right nare. Still with some swelling and tenderness. Can open mouth well.  Eyes:     Extraocular Movements: Extraocular movements intact.     Pupils: Pupils are equal, round, and reactive to light.  Cardiovascular:     Rate and Rhythm: Normal rate and regular rhythm.  Pulmonary:     Effort: Pulmonary effort is normal. No respiratory distress.     Breath sounds: Normal breath sounds.  Abdominal:     General: Bowel sounds are normal. There is no distension.     Palpations: Abdomen is soft.  Musculoskeletal:     Comments: Rt FA PIV leaking  Skin:    General: Skin is warm and dry.  Neurological:     Mental Status: He is alert.     Lab Results Lab Results   Component Value Date   WBC 7.8 09/23/2023   HGB 14.6 09/23/2023   HCT 43.0 09/23/2023   MCV 87.4 09/23/2023   PLT 317 09/23/2023    Lab Results  Component Value Date   CREATININE 0.92 09/23/2023   BUN 20 09/23/2023   NA 137 09/23/2023   K 3.9 09/23/2023   CL 101 09/23/2023   CO2 28 09/23/2023    Lab Results  Component Value Date   ALT 67 (H) 05/06/2023   AST 56 (H) 03/28/2023   GGT 323 (H) 05/06/2023   ALKPHOS 163 (H) 03/28/2023   BILITOT 0.3 03/28/2023     Microbiology: Recent Results (from the past 240 hour(s))  MRSA Next Gen by PCR, Nasal     Status: Abnormal   Collection Time: 09/21/23  5:44 PM   Specimen: Nasal Mucosa; Nasal Swab  Result Value Ref Range Status   MRSA by PCR Next Gen DETECTED (A) NOT DETECTED Final    Comment: RESULT CALLED TO, READ BACK BY AND VERIFIED WITH: GULAPPA RN @ [phone] ON 09/22/2023 BY MTA (NOTE) The GeneXpert MRSA Assay (FDA approved for NASAL specimens only), is one component of a comprehensive MRSA colonization surveillance program. It is not intended to diagnose MRSA infection nor to guide or monitor treatment for MRSA infections. Test performance is not FDA approved in patients less than 37 years old. Performed at [facility], [address], [place], [place] [address]   Culture, blood (Routine X 2) w Reflex to ID Panel     Status: None (Preliminary result)   Collection Time: 09/22/23  2:55 PM   Specimen: BLOOD RIGHT ARM  Result Value Ref Range Status   Specimen Description   Final    BLOOD RIGHT ARM Performed at [facility], [address], [place], [place] [address]    Special Requests   Final    BOTTLES DRAWN AEROBIC AND ANAEROBIC Blood Culture results may not be optimal due to an inadequate volume of blood received in culture bottles Performed at [facility], [address], [place], [place] [address]    Culture   Final    NO GROWTH 2 DAYS Performed at [facility], [address], [place], [place] [address]    Report Status PENDING  Incomplete  Culture, blood (Routine X 2) w Reflex to ID Panel     Status: None (Preliminary result)   Collection Time: 09/22/23  2:55 PM   Specimen: BLOOD LEFT ARM  Result Value Ref Range Status  Specimen Description   Final    BLOOD LEFT ARM Performed at [facility], [address], [place], [place] [address]    Special Requests   Final    BOTTLES DRAWN AEROBIC AND ANAEROBIC Blood Culture results may not be optimal due to an inadequate volume of blood received in culture bottles Performed at [facility], [address], [place], [place] [address]    Culture   Final    NO GROWTH 2 DAYS Performed at [facility], [address] [place], [place] [address]    Report Status PENDING  Incomplete    Rexene Alberts, MSN, NP-C [facility]  [email or web address] Pager: [phone] Office: [phone] [facility] Main Line: [phone] *Secure Chat Communication Welcome

## 2023-09-24 NOTE — Plan of Care (Signed)
Patient continues to complain of 10/10 facial pain/pressure.  He states pain medications help some, for a very short period of time. This RN provided warm packs (in pillow case) to apply to face for increased comfort x 3 this shift, as patient states this helps a lot. This RN also suggested he lye on his stomach versus his back to help further reduce facial pressure (normal gravity adds to pressure0. He states this does help and lying on his side as well. Pt encouraged to reposition often to help reduce pain/pressure to face. Understanding verbalized. Patient does fall asleep after Morphine admin and snores while this RN in room x 2. Patient able to tolerate regular diet and frequently requests ice cream and sodas to drink. Moisturizing lotion given for pt's c/o dry, itchy scalp- This RN advised him not to scratch these areas to prevent further complications. Patient states he feels similar nodules along the right side of his neck. This RN reinforced that he should not scratch or manipulate these areas and to also mention this to his Doctors at next encounter.  He is currently resting quietly in bed with heat packs on face.

## 2023-09-24 NOTE — Progress Notes (Signed)
PROGRESS NOTE  Pedro Owens  HKV:[id number] DOB: May 31, 1991 DOA: 09/21/2023 PCP: Pcp, No   Brief Narrative: Patient is a 32 year old male with history of hepatitis C, HIV, herpes, syphilis, ADHD who presented with facial swelling, erythema after popping a pimple on the right nostril.  He was admitted for the management of right facial cellulitis with a small paranasal abscess.  Case discussed with ENT, transferred to [facility] for possible operative intervention.  ID also consulted and following.  Currently he is on  conservative management with antibiotics.  Awaiting ENT's plan for finalization of definitive plan   Assessment & Plan:  Principal Problem:   Facial abscess Active Problems:   HIV disease (HCC)   Cigarette smoker   Polysubstance abuse (HCC)   Chronic hepatitis C without hepatic coma (HCC)   Facial cellulitis  Right facial cellulitis: Presented with presented with facial swelling, erythema after popping a pimple on the right nostril.  Also found to have a small paranasal abscess.  Currently on broad-spectrum antibiotics.  ID and ENT following.  On doxy and Unasyn.  Follow-up cultures: No growth till date. ENT following and will let us know if there is plan for I&D  HIV: On Biktarvy.  He has history of neurosyphilis, treated with penicillin.  Polysubstance abuse: He says he is not currently using any drugs.  Chronic hepatitis C: We recommend to follow-up with ID as an outpatient  Tobacco use: We counseled for cessation  Itching: Continue Atarax.Much better today         DVT prophylaxis:enoxaparin (LOVENOX) injection 40 mg Start: 09/21/23 2200     Code Status: Full Code  Family Communication: None at the bedside  Patient status:Inpatient  Patient is from :Home  Anticipated discharge FI:EPPI  Estimated DC date:after ENT clearance   Consultants:ID,ENT  Procedures:None  Antimicrobials:  Anti-infectives (From admission, onward)    Start      Dose/Rate Route Frequency Ordered Stop   09/23/23 1200  doxycycline (VIBRA-TABS) tablet 100 mg        100 mg Oral 2 times daily 09/23/23 1057     09/23/23 0000  bictegravir-emtricitabine-tenofovir AF (BIKTARVY) 50-200-25 MG TABS tablet       Note to Pharmacy: Medicaid; no other coverage   1 tablet Oral Daily 09/23/23 1109     09/22/23 1430  Ampicillin-Sulbactam (UNASYN) 3 g in sodium chloride 0.9 % 100 mL IVPB        3 g 200 mL/hr over 30 Minutes Intravenous Every 6 hours 09/22/23 1333     09/22/23 1000  bictegravir-emtricitabine-tenofovir AF (BIKTARVY) 50-200-25 MG per tablet 1 tablet        1 tablet Oral Daily 09/21/23 1624     09/21/23 2200  Vancomycin (VANCOCIN) 1,500 mg in sodium chloride 0.9 % 500 mL IVPB  Status:  Discontinued        1,500 mg 250 mL/hr over 120 Minutes Intravenous Every 12 hours 09/21/23 1529 09/23/23 1057   09/21/23 1015  Vancomycin (VANCOCIN) 1,500 mg in sodium chloride 0.9 % 500 mL IVPB        1,500 mg 250 mL/hr over 120 Minutes Intravenous  Once 09/21/23 1006 09/21/23 1245       Subjective: Patient seen and examined at bedside today.  Hemodynamically stable.  Afebrile.  Swelling of the left face has significant improved today but he complains of severe pain on the central face and eyes.  There is no discharge or redness of the eyes  Objective: Vitals:   09/23/23  0830 09/23/23 1957 09/24/23 0338 09/24/23 0736  BP: 129/84 (!) 142/87 126/87 133/84  Pulse: (!) 54 70 85 73  Resp: 18 16 20 17   Temp: 97.6 F (36.4 C) 98 F (36.7 C) 98.4 F (36.9 C) 99 F (37.2 C)  TempSrc: Oral Oral Oral Oral  SpO2: 100% 100% 100% 100%  Weight:      Height:        Intake/Output Summary (Last 24 hours) at 09/24/2023 1136 Last data filed at 09/24/2023 0905 Gross per 24 hour  Intake 460 ml  Output --  Net 460 ml   Filed Weights   09/21/23 0913  Weight: 74.8 kg    Examination:   General exam: Overall comfortable, not in distress HEENT: PERRL, mild swelling of  left face.  Tender left face Respiratory system:  no wheezes or crackles  Cardiovascular system: S1 & S2 heard, RRR.  Gastrointestinal system: Abdomen is nondistended, soft and nontender. Central nervous system: Alert and oriented Extremities: No edema, no clubbing ,no cyanosis Skin: No rashes, no ulcers,no icterus     Data Reviewed: I have personally reviewed following labs and imaging studies  CBC: Recent Labs  Lab 09/21/23 1003 09/23/23 0840  WBC 10.9* 7.8  HGB 17.4* 14.6  HCT 53.2* 43.0  MCV 89.4 87.4  PLT 348 317   Basic Metabolic Panel: Recent Labs  Lab 09/21/23 1003 09/23/23 0840  NA 136 137  K 4.7 3.9  CL 103 101  CO2 24 28  GLUCOSE 86 88  BUN 18 20  CREATININE 0.86 0.92  CALCIUM 9.3 8.8*     Recent Results (from the past 240 hour(s))  MRSA Next Gen by PCR, Nasal     Status: Abnormal   Collection Time: 09/21/23  5:44 PM   Specimen: Nasal Mucosa; Nasal Swab  Result Value Ref Range Status   MRSA by PCR Next Gen DETECTED (A) NOT DETECTED Final    Comment: RESULT CALLED TO, READ BACK BY AND VERIFIED WITH: GULAPPA RN @ [phone] ON 09/22/2023 BY MTA (NOTE) The GeneXpert MRSA Assay (FDA approved for NASAL specimens only), is one component of a comprehensive MRSA colonization surveillance program. It is not intended to diagnose MRSA infection nor to guide or monitor treatment for MRSA infections. Test performance is not FDA approved in patients less than 74 years old. Performed at [facility], [address] [place], [place] [address]   Culture, blood (Routine X 2) w Reflex to ID Panel     Status: None (Preliminary result)   Collection Time: 09/22/23  2:55 PM   Specimen: BLOOD RIGHT ARM  Result Value Ref Range Status   Specimen Description   Final    BLOOD RIGHT ARM Performed at [facility], [address] [place], [place] [address]    Special Requests   Final    BOTTLES DRAWN AEROBIC AND ANAEROBIC Blood Culture results may not  be optimal due to an inadequate volume of blood received in culture bottles Performed at [facility], [address], [place], [place] [address]    Culture   Final    NO GROWTH 2 DAYS Performed at [facility], [address], [place], [place] [address]    Report Status PENDING  Incomplete  Culture, blood (Routine X 2) w Reflex to ID Panel     Status: None (Preliminary result)   Collection Time: 09/22/23  2:55 PM   Specimen: BLOOD LEFT ARM  Result Value Ref Range  Status   Specimen Description   Final    BLOOD LEFT ARM Performed at [facility], [address], [place], [place] [address]    Special Requests   Final    BOTTLES DRAWN AEROBIC AND ANAEROBIC Blood Culture results may not be optimal due to an inadequate volume of blood received in culture bottles Performed at [facility], [address], [place], [place] [address]    Culture   Final    NO GROWTH 2 DAYS Performed at [facility], [address], [place], [place] [address]    Report Status PENDING  Incomplete     Radiology Studies: No results found.  Scheduled Meds:  bictegravir-emtricitabine-tenofovir AF  1 tablet Oral Daily   Chlorhexidine Gluconate Cloth  6 each Topical Q0600   doxycycline  100 mg Oral BID   enoxaparin (LOVENOX) injection  40 mg Subcutaneous Q24H   mupirocin ointment  1 Application Nasal BID   Continuous Infusions:  ampicillin-sulbactam (UNASYN) IV 200 mL/hr at 09/24/23 0830     LOS: 3 days   Burnadette Pop, MD [facility] P12/03/2023, 11:36 AM

## 2023-09-24 NOTE — Plan of Care (Signed)
  Problem: Clinical Measurements: Goal: Will remain free from infection Outcome: Progressing   Problem: Clinical Measurements: Goal: Diagnostic test results will improve Outcome: Progressing   Problem: Coping: Goal: Level of anxiety will decrease Outcome: Progressing   Problem: Pain Management: Goal: General experience of comfort will improve Outcome: Progressing

## 2023-09-24 NOTE — Progress Notes (Signed)
UA collected and sent to lab.

## 2023-09-24 NOTE — Progress Notes (Signed)
ENT Progress Note  32 yoM hx of HIV (last CD4 475), here for swelling and pain at the right nasal sill which began as a pimple 6 days ago, and after he popped it he developed swelling redness and pain around the site. Started on Vanco last night when admitted to [facility] 09/22/23.   Update 09/24/23 He is doing better, reports significant improvement of the area around right nasal sill in terms of swelling and redness. Still having some pain but it is much better. On IV Unasyn and PO Doxy per ID. WBC of 10.9 3 days ago and 7.8 yesterday. No fevers chills.   Records Reviewed:  ID progress note for yesterday - no notes from today  A/P Facial abscess - positive MRSA pcr but unclear without cultures to guide.  On broad coverage and will continue with doxycycline with amp/sulbactam and anticipate oral doxy + Augmentin for home, pending I and D.   HIV - has some intermittent non-compliance but CD4 475.   On treatment and will continue Has follow up  Facial Abscess / Abscess -  ENT following, no I&D planned at this time. MRSA PCR + with leaking IV and quick resolution of leukocytosis will transition to oral doxycycline 100 mg PO BID. Continue Unasyn for now. While he does have HIV his CD4 of 475 does not put him at any risk for any unusual pathogens here. May be ready to convert to total oral regimen after another day of abx and ENT re-eval  -Change vancomycin to Doxycycline 100 mg bid po with food  -continue unasyn IV for now -Leukocytosis resolved - if no indications to drain and pain is reasonably controlled may be able to switch to orals and plan for community follow up tomorrow.  -OP ID FU scheduled with Dr. Drue Second on Wednesday 12/11 @ 10: 45 am - his mother will drive him.      HIV -  Continue biktarvy once daily. Given challenges with getting to clinic I was candid with him and explained that there is more risk a/w interrupted doses of Cabenuva and would recommend to wait on that consideration to  lessen the risk of drug resistance.  -HIV RNA and CD4 pending  -Continue biktarvy everday  -Will help with rx to bedside to bridge until he can get another refill from pharmacy in the community  -FU with Dr. Drue Second arranged.      H/O Neurosyphilis -  Titer dropped appropriately (1:1024 --> 1:16) indicating successful treatment. Discussed this today.  -nothing further to do outside of re-screening. May consider doxy pep for him in the future, though may make treatment of potential SSTIs in the future ineffective with tetracyclines.      Hepatitis C, GT 1a, Untreated -  F3 on fibrotest in July which is a bit more advanced than I would expect. Will get him back to clinic to continue further work up. Needs liver U/S to follow up (not scheduled)  -Coordinate outpatient eval/treatment with Dr. Drue Second.      PLAN: Change vancomycin to doxycycline PO 100 mg BID with meals  Continue IV Unasyn  FU HIV VL, CD4 OP follow up for chronic hep c and HIV 12/11   Past Medical History:  Diagnosis Date   ADHD (attention deficit hyperactivity disorder)    Anal warts    Anxiety    Asthma    Bipolar disorder (HCC)    Chronic bronchitis (HCC)    Chronic pain syndrome    Convulsions (HCC) 08/14/2015  Depression    Gonorrhea 09/08/11   Headache    "weekly" (11/06/2016)   Hemorrhoids, internal, with bleeding 08/25/2011   Hepatitis C    Herpes    HIV infection (HCC)    Hypertension    ILEITIS 09/24/2010   Qualifier: Diagnosis of  By: Candice Camp CMA (AAMA), Dottie     Lymphoid hyperplasia, reactive    Marijuana abuse    Migraine    "monthly" (11/06/2016)   Pseudoseizures    Seizures (HCC)    "don't know why I have them" (11/06/2016)   Syphilis     Past Surgical History:  Procedure Laterality Date   ANKLE SURGERY Bilateral 2005   Screw & Pins   ANKLE SURGERY Right 2008   "replaced pins & screws"   FOOT SURGERY Bilateral 2005   "bone spurs"   I & D EXTREMITY Right 06/26/2016   Procedure:  IRRIGATION AND DEBRIDEMENT RIGHT FOREARM;  Surgeon: Betha Loa, MD;  Location: [facility] OR;  Service: Orthopedics;  Laterality: Right;   I & D EXTREMITY Right 11/04/2016   Procedure: IRRIGATION AND DEBRIDEMENT EXTREMITY;  Surgeon: Dairl Ponder, MD;  Location: [facility] OR;  Service: Orthopedics;  Laterality: Right;   I & D EXTREMITY Right 11/06/2016   Procedure: IRRIGATION AND DEBRIDEMENT right forarm;  Surgeon: Dairl Ponder, MD;  Location: [facility] OR;  Service: Orthopedics;  Laterality: Right;   TEE WITHOUT CARDIOVERSION N/A 11/19/2017   Procedure: TRANSESOPHAGEAL ECHOCARDIOGRAM (TEE);  Surgeon: Pricilla Riffle, MD;  Location: [facility] ENDOSCOPY;  Service: Cardiovascular;  Laterality: N/A;   VIDEO ASSISTED THORACOSCOPY (VATS)/EMPYEMA Left 11/26/2017   Procedure: VIDEO ASSISTED THORACOSCOPY (VATS)/DRAIN EMPYEMA;  Surgeon: Kerin Perna, MD;  Location: [facility] OR;  Service: Thoracic;  Laterality: Left;    Family History  Problem Relation Age of Onset   Diabetes Mother    Irritable bowel syndrome Mother    Hypertension Mother    Hyperlipidemia Mother    Hypothyroidism Mother    Diabetes Maternal Uncle    Heart disease Maternal Grandmother    Leukemia Other        maternal greatgrandmother   Colon cancer Neg Hx     Social History:  reports that he has been smoking cigarettes. He has a 16.5 pack-year smoking history. He has never used smokeless tobacco. He reports current alcohol use. He reports that he does not currently use drugs after having used the following drugs: Cocaine, Benzodiazepines, Oxycodone, Marijuana, and Heroin. Frequency: 2.00 times per week.  Allergies:  Allergies  Allergen Reactions   Acetaminophen Diarrhea, Nausea And Vomiting and Other (See Comments)    Flares up crohns disease    Other Other (See Comments)    IV CONTRAST    Vicodin [Hydrocodone-Acetaminophen] Nausea And Vomiting   Dilaudid [Hydromorphone Hcl] Hives and Rash    Medications: I have reviewed the patient's current  medications.  The PMH, PSH, Medications, Allergies, and SH were reviewed and updated.  ROS: Constitutional: Negative for fever, weight loss and weight gain. Cardiovascular: Negative for chest pain and dyspnea on exertion. Respiratory: Is not experiencing shortness of breath at rest. Gastrointestinal: Negative for nausea and vomiting. Neurological: Negative for headaches. Psychiatric: The patient is not nervous/anxious  Blood pressure 133/84, pulse 73, temperature 99 F (37.2 C), temperature source Oral, resp. rate 17, height 5\' 11"  (1.803 m), weight 74.8 kg, SpO2 100%.  PHYSICAL EXAM:  Exam: General: Well-developed, well-nourished Respiratory Respiratory effort: Equal inspiration and expiration without stridor Cardiovascular Peripheral Vascular: Warm extremities with equal color/perfusion Eyes: No nystagmus  with equal extraocular motion bilaterally Neuro/Psych/Balance: Patient oriented to person, place, and time; Appropriate mood and affect; Cranial nerves II-XII are intact Head and Face Inspection: Normocephalic and atraumatic without mass or lesion Area of induration and swelling previously seen around Right nasal sill is significantly better, mild discomfort with palpation, no drainage and no fluctuance Facial Strength: Facial motility symmetric and full bilaterally EN Pinna: External ear intact and fully developed External Nose: No scar or anatomic deformity Internal Nose: Septum is intact. Bilateral inferior turbinate hypertrophy.  Lips, Teeth, and gums: Mucosa and teeth intact and viable Oral cavity/oropharynx: No erythema or exudate, no lesions present Neck Neck and Trachea: Midline trachea without mass or lesion   Procedure: none  Studies Reviewed: CT max/face 09/21/23 1. Small area of low density with peripheral enhancement in the right inferior paranasal soft tissues, which measures 5 x 13 x 7 mm, which may represent a small abscess. 2. Inflammatory stranding in  the right greater than left facial soft tissues, which may represent cellulitis. 3. Dental caries in the bilateral second premolars.  Assessment/Plan: Facial cellulitis - improved Possible collection which is 7 mm in largest diameter on CT max/face 3 days ago - scan reviewed - due to small size, was treated conservatively with broad spectrum abx, and based on exam today he responded well.   Continue abx per ID  Continue pain control  Due to improvement on abx, no plan for I&D at this time When ready from the pain standpoint, ok to d/c on full course of abx per ID, and outpatient f/u with me in 2 weeks.   Thank you for allowing me to participate in the care of this patient. Please do not hesitate to contact me with any questions or concerns.   Ashok Croon, MD Otolaryngology [facility] Phone: [phone] Fax: [phone]    09/24/2023, 1:35 PM

## 2023-09-25 ENCOUNTER — Other Ambulatory Visit (HOSPITAL_COMMUNITY): Payer: Self-pay

## 2023-09-25 DIAGNOSIS — L0201 Cutaneous abscess of face: Secondary | ICD-10-CM | POA: Diagnosis not present

## 2023-09-25 MED ORDER — IBUPROFEN 600 MG PO TABS
600.0000 mg | ORAL_TABLET | Freq: Four times a day (QID) | ORAL | 0 refills | Status: DC | PRN
  Filled 2023-09-25: qty 30, 8d supply, fill #0

## 2023-09-25 MED ORDER — NICOTINE 21 MG/24HR TD PT24
21.0000 mg | MEDICATED_PATCH | Freq: Every day | TRANSDERMAL | 0 refills | Status: DC | PRN
  Filled 2023-09-25: qty 28, 28d supply, fill #0

## 2023-09-25 MED ORDER — OXYCODONE HCL 5 MG PO TABS
5.0000 mg | ORAL_TABLET | ORAL | 0 refills | Status: DC | PRN
  Filled 2023-09-25: qty 20, 2d supply, fill #0

## 2023-09-25 NOTE — Discharge Summary (Signed)
Physician Discharge Summary  Pedro Owens ZOX:[id number] DOB: 03-10-1991 DOA: 09/21/2023  PCP: Pcp, No  Admit date: 09/21/2023 Discharge date: 09/25/2023  Admitted From: Home Disposition:  Home  Discharge Condition:Stable CODE STATUS:FULL Diet recommendation:  Regular   Brief/Interim Summary: Patient is a 32 year old male with history of hepatitis C, HIV, herpes, syphilis, ADHD who presented with facial swelling, erythema after popping a pimple on the right nostril.  He was admitted for the management of right facial cellulitis with a small paranasal abscess.  Case discussed with ENT, transferred to [facility] for possible operative intervention.  ID also consulted and following.  Currently he is on  conservative management with antibiotics.  ENT did not recommend any surgical intervention.  Continue antibiotics on discharge.  He will follow-up with ID as an outpatient.   Following problems were addressed during the hospitalization:   Right facial cellulitis: Presented with presented with facial swelling, erythema after popping a pimple on the right nostril.  Also found to have a small paranasal abscess. Consulted   ID and ENT.   Follow-up cultures: No growth till date.  No plan for surgical management. ID recommend for discharge on Augmentin and doxycycline but he will follow-up with ID as an outpatient.   HIV: On Biktarvy.  He has history of neurosyphilis, treated with penicillin.   Polysubstance abuse: He says he is not currently using any drugs.   Chronic hepatitis C: We recommend to follow-up with ID as an outpatient   Tobacco use: We counseled for cessation   Itching: Resolved with atarax      Discharge Diagnoses:  Principal Problem:   Facial abscess Active Problems:   HIV disease (HCC)   Cigarette smoker   Polysubstance abuse (HCC)   Chronic hepatitis C without hepatic coma (HCC)   Facial cellulitis    Discharge Instructions  Discharge Instructions     Diet  general   Complete by: As directed    Discharge instructions   Complete by: As directed    1)Please take prescribed medications as instructed 2)You have follow-up with ID on 12/11   Increase activity slowly   Complete by: As directed       Allergies as of 09/25/2023       Reactions   Acetaminophen Diarrhea, Nausea And Vomiting, Other (See Comments)   Flares up crohns disease   Other Other (See Comments)   IV CONTRAST   Vicodin [hydrocodone-acetaminophen] Nausea And Vomiting   Dilaudid [hydromorphone Hcl] Hives, Rash        Medication List     TAKE these medications    amoxicillin-clavulanate 875-125 MG tablet Commonly known as: AUGMENTIN Take 1 tablet by mouth 2 (two) times daily for 12 days.   bictegravir-emtricitabine-tenofovir AF 50-200-25 MG Tabs tablet Commonly known as: BIKTARVY Take 1 tablet by mouth daily.   doxycycline 100 MG tablet Commonly known as: VIBRA-TABS Take 1 tablet (100 mg total) by mouth 2 (two) times daily for 12 days.   ibuprofen 600 MG tablet Commonly known as: ADVIL Take 1 tablet (600 mg total) by mouth every 6 (six) hours as needed.   nicotine 21 mg/24hr patch Commonly known as: NICODERM CQ - dosed in mg/24 hours Place 1 patch (21 mg total) onto the skin daily as needed (Nicotine withdrawal symptoms.).   ondansetron 4 MG tablet Commonly known as: Zofran Take 1 tablet (4 mg total) by mouth every 8 (eight) hours as needed for nausea or vomiting.   oxyCODONE 5 MG immediate release tablet  Commonly known as: Oxy IR/ROXICODONE Take 1-2 tablets (5-10 mg total) by mouth every 4 (four) hours as needed for moderate pain (pain score 4-6) or severe pain (pain score 7-10).   valACYclovir 500 MG tablet Commonly known as: Valtrex Take 1 tablet (500 mg total) by mouth daily. What changed:  when to take this reasons to take this        Follow-up Information     Judyann Munson, MD Follow up on 09/29/2023.   Specialty: Infectious  Diseases Why: Wednesday 12/11 @ 10: 45 am Contact information: [address] [place] [address] [phone]         Hilbert Bible, FNP Follow up.   Specialty: Family Medicine Why: 09-30-23 at 1030 am, arrive 20 minutes early to fill out paperwork  Take insurance card , list of medications, photo ID Contact information: [address] [place] [address] [phone]                Allergies  Allergen Reactions   Acetaminophen Diarrhea, Nausea And Vomiting and Other (See Comments)    Flares up crohns disease    Other Other (See Comments)    IV CONTRAST    Vicodin [Hydrocodone-Acetaminophen] Nausea And Vomiting   Dilaudid [Hydromorphone Hcl] Hives and Rash    Consultations: ID, ENT   Procedures/Studies: CT Maxillofacial W Contrast  Result Date: 09/21/2023 CLINICAL DATA:  Facial infection, nasal abscess EXAM: CT MAXILLOFACIAL WITH CONTRAST TECHNIQUE: Multidetector CT imaging of the maxillofacial structures was performed with intravenous contrast. Multiplanar CT image reconstructions were also generated. RADIATION DOSE REDUCTION: This exam was performed according to the departmental dose-optimization program which includes automated exposure control, adjustment of the mA and/or kV according to patient size and/or use of iterative reconstruction technique. CONTRAST:  75mL OMNIPAQUE IOHEXOL 300 MG/ML  SOLN COMPARISON:  No prior CT maxillofacial available, correlation is made with 03/27/2023 CT head FINDINGS: Osseous: No fracture or mandibular dislocation. No destructive process. Dental caries in the bilateral second premolars. No advanced periapical lucency. Orbits: Negative. No traumatic or inflammatory finding. Sinuses: Minimal mucosal thickening in the left frontal sinus. Otherwise clear paranasal sinuses. The mastoids are well aerated. Soft tissues: No inflammatory stranding in the right greater than left facial soft  tissues. Small area of low density with peripheral enhancement in the right inferior paranasal soft tissues, which measures 5 x 13 x 7 mm (AP x TR x CC) (series 3, image 45 and series 7, image 41). Which may represent a small abscess. No other focal collection is seen. Limited intracranial: No significant or unexpected finding. IMPRESSION: 1. Small area of low density with peripheral enhancement in the right inferior paranasal soft tissues, which measures 5 x 13 x 7 mm, which may represent a small abscess. 2. Inflammatory stranding in the right greater than left facial soft tissues, which may represent cellulitis. 3. Dental caries in the bilateral second premolars. Electronically Signed   By: Wiliam Ke M.D.   On: 09/21/2023 13:42      Subjective: Patient seen and examined at bedside today.  Hemodynamically stable .complains of some pain on the right of the face again today.  We discussed about continuing pain management and antibiotics on discharge.  Discharge Exam: Vitals:   09/25/23 0358 09/25/23 0923  BP: 113/72 104/67  Pulse: (!) 57 70  Resp: 20 18  Temp: 97.8 F (36.6 C) 98.1 F (36.7 C)  SpO2: 99% 100%   Vitals:   09/24/23 1643 09/24/23  2055 09/25/23 0358 09/25/23 0923  BP: (!) 158/105 (!) 131/92 113/72 104/67  Pulse: 88 86 (!) 57 70  Resp: 17 16 20 18   Temp: 98.4 F (36.9 C) 98.1 F (36.7 C) 97.8 F (36.6 C) 98.1 F (36.7 C)  TempSrc: Oral Oral Oral Oral  SpO2: 100% 98% 99% 100%  Weight:      Height:        General: Pt is alert, awake, not in acute distress Cardiovascular: RRR, S1/S2 +, no rubs, no gallops Respiratory: CTA bilaterally, no wheezing, no rhonchi Abdominal: Soft, NT, ND, bowel sounds + Extremities: no edema, no cyanosis    The results of significant diagnostics from this hospitalization (including imaging, microbiology, ancillary and laboratory) are listed below for reference.     Microbiology: Recent Results (from the past 240 hour(s))  MRSA  Next Gen by PCR, Nasal     Status: Abnormal   Collection Time: 09/21/23  5:44 PM   Specimen: Nasal Mucosa; Nasal Swab  Result Value Ref Range Status   MRSA by PCR Next Gen DETECTED (A) NOT DETECTED Final    Comment: RESULT CALLED TO, READ BACK BY AND VERIFIED WITH: GULAPPA RN @ [phone] ON 09/22/2023 BY MTA (NOTE) The GeneXpert MRSA Assay (FDA approved for NASAL specimens only), is one component of a comprehensive MRSA colonization surveillance program. It is not intended to diagnose MRSA infection nor to guide or monitor treatment for MRSA infections. Test performance is not FDA approved in patients less than 3 years old. Performed at [facility], [address], [place], [place] [address]   Culture, blood (Routine X 2) w Reflex to ID Panel     Status: None (Preliminary result)   Collection Time: 09/22/23  2:55 PM   Specimen: BLOOD RIGHT ARM  Result Value Ref Range Status   Specimen Description   Final    BLOOD RIGHT ARM Performed at [facility], [address], [place], [place] [address]    Special Requests   Final    BOTTLES DRAWN AEROBIC AND ANAEROBIC Blood Culture results may not be optimal due to an inadequate volume of blood received in culture bottles Performed at [facility], [address], [place], [place] [address]    Culture   Final    NO GROWTH 3 DAYS Performed at [facility], [address], [place], [place] [address]    Report Status PENDING  Incomplete  Culture, blood (Routine X 2) w Reflex to ID Panel     Status: None (Preliminary result)   Collection Time: 09/22/23  2:55 PM   Specimen: BLOOD LEFT ARM  Result Value Ref Range Status   Specimen Description   Final    BLOOD LEFT ARM Performed at [facility], [address], [place], [place] [address]    Special Requests   Final    BOTTLES DRAWN AEROBIC AND ANAEROBIC Blood Culture results may not be optimal due to an inadequate volume of blood received in  culture bottles Performed at [facility], [address], [place], [place] [address]    Culture   Final    NO GROWTH 3 DAYS Performed at [facility], [address], [place], [place] [address]    Report Status PENDING  Incomplete     Labs: BNP (last 3 results) No results for input(s): "BNP" in the last 8760 hours. Basic Metabolic Panel: Recent Labs  Lab 09/21/23 1003 09/23/23 0840  NA 136 137  K 4.7 3.9  CL 103 101  CO2 24 28  GLUCOSE 86 88  BUN 18 20  CREATININE 0.86 0.92  CALCIUM 9.3 8.8*   Liver Function Tests: No results for input(s): "AST", "ALT", "ALKPHOS", "BILITOT", "PROT", "ALBUMIN" in the last 168 hours. No results for input(s): "LIPASE", "AMYLASE" in the last 168 hours. No results for input(s): "AMMONIA" in the last 168 hours. CBC: Recent Labs  Lab 09/21/23 1003 09/23/23 0840  WBC 10.9* 7.8  HGB 17.4* 14.6  HCT 53.2* 43.0  MCV 89.4 87.4  PLT 348 317   Cardiac Enzymes: No results for input(s): "CKTOTAL", "CKMB", "CKMBINDEX", "TROPONINI" in the last 168 hours. BNP: Invalid input(s): "POCBNP" CBG: No results for input(s): "GLUCAP" in the last 168 hours. D-Dimer No results for input(s): "DDIMER" in the last 72 hours. Hgb A1c No results for input(s): "HGBA1C" in the last 72 hours. Lipid Profile No results for input(s): "CHOL", "HDL", "LDLCALC", "TRIG", "CHOLHDL", "LDLDIRECT" in the last 72 hours. Thyroid function studies No results for input(s): "TSH", "T4TOTAL", "T3FREE", "THYROIDAB" in the last 72 hours.  Invalid input(s): "FREET3" Anemia work up No results for input(s): "VITAMINB12", "FOLATE", "FERRITIN", "TIBC", "IRON", "RETICCTPCT" in the last 72 hours. Urinalysis    Component Value Date/Time   COLORURINE YELLOW 09/24/2023 0045   APPEARANCEUR CLEAR 09/24/2023 0045   LABSPEC 1.015 09/24/2023 0045   PHURINE 6.0 09/24/2023 0045   GLUCOSEU NEGATIVE 09/24/2023 0045   HGBUR NEGATIVE 09/24/2023 0045   BILIRUBINUR  NEGATIVE 09/24/2023 0045   KETONESUR NEGATIVE 09/24/2023 0045   PROTEINUR NEGATIVE 09/24/2023 0045   UROBILINOGEN 1.0 08/04/2015 1935   NITRITE NEGATIVE 09/24/2023 0045   LEUKOCYTESUR NEGATIVE 09/24/2023 0045   Sepsis Labs Recent Labs  Lab 09/21/23 1003 09/23/23 0840  WBC 10.9* 7.8   Microbiology Recent Results (from the past 240 hour(s))  MRSA Next Gen by PCR, Nasal     Status: Abnormal   Collection Time: 09/21/23  5:44 PM   Specimen: Nasal Mucosa; Nasal Swab  Result Value Ref Range Status   MRSA by PCR Next Gen DETECTED (A) NOT DETECTED Final    Comment: RESULT CALLED TO, READ BACK BY AND VERIFIED WITH: GULAPPA RN @ [phone] ON 09/22/2023 BY MTA (NOTE) The GeneXpert MRSA Assay (FDA approved for NASAL specimens only), is one component of a comprehensive MRSA colonization surveillance program. It is not intended to diagnose MRSA infection nor to guide or monitor treatment for MRSA infections. Test performance is not FDA approved in patients less than 17 years old. Performed at [facility], [address] [place], [place] [address]   Culture, blood (Routine X 2) w Reflex to ID Panel     Status: None (Preliminary result)   Collection Time: 09/22/23  2:55 PM   Specimen: BLOOD RIGHT ARM  Result Value Ref Range Status   Specimen Description   Final    BLOOD RIGHT ARM Performed at [facility], [address] [place], [place] [address]    Special Requests   Final    BOTTLES DRAWN AEROBIC AND ANAEROBIC Blood Culture results may not be optimal due to an inadequate volume of blood received in culture bottles Performed at [facility], [address] [place], [place] [address]    Culture   Final    NO GROWTH 3 DAYS Performed at [facility], [address] [place], [place] [address]    Report Status PENDING  Incomplete  Culture, blood (Routine  X 2) w Reflex to ID Panel     Status: None (Preliminary result)   Collection Time:  09/22/23  2:55 PM   Specimen: BLOOD LEFT ARM  Result Value Ref Range Status   Specimen Description   Final    BLOOD LEFT ARM Performed at [facility], [address] [place], [place] [address]    Special Requests   Final    BOTTLES DRAWN AEROBIC AND ANAEROBIC Blood Culture results may not be optimal due to an inadequate volume of blood received in culture bottles Performed at [facility], [address] [place], [place] [address]    Culture   Final    NO GROWTH 3 DAYS Performed at [facility], [address], [place], [place] [address]    Report Status PENDING  Incomplete    Please note: You were cared for by a hospitalist during your hospital stay. Once you are discharged, your primary care physician will handle any further medical issues. Please note that NO REFILLS for any discharge medications will be authorized once you are discharged, as it is imperative that you return to your primary care physician (or establish a relationship with a primary care physician if you do not have one) for your post hospital discharge needs so that they can reassess your need for medications and monitor your lab values.    Time coordinating discharge: 40 minutes  SIGNED:   Burnadette Pop, MD  [facility] 09/25/2023, 11:06 AM Pager [phone]  If 7PM-7AM, please contact night-coverage [email or web address] Password [email or web address]

## 2023-09-27 LAB — CULTURE, BLOOD (ROUTINE X 2)
Culture: NO GROWTH
Culture: NO GROWTH

## 2023-09-29 ENCOUNTER — Ambulatory Visit: Payer: Medicaid Other | Admitting: Internal Medicine

## 2023-09-30 ENCOUNTER — Ambulatory Visit (HOSPITAL_BASED_OUTPATIENT_CLINIC_OR_DEPARTMENT_OTHER): Payer: Medicaid Other | Admitting: Family Medicine

## 2023-10-27 ENCOUNTER — Ambulatory Visit: Payer: Medicaid Other | Admitting: Podiatry

## 2023-11-01 ENCOUNTER — Ambulatory Visit: Payer: Medicaid Other | Admitting: Internal Medicine

## 2023-11-01 ENCOUNTER — Telehealth: Payer: Self-pay

## 2023-11-01 NOTE — Telephone Encounter (Signed)
 Team will simply continue trying to schedule patient with pharmacy - thanks.

## 2023-11-01 NOTE — Telephone Encounter (Signed)
 Attempted to contact to reschedule after No Show 11/01/23 with Dr Drue Second unable to leave message.  Notified pharmacy at [facility] for intervention since multiple back to back no shows

## 2023-11-17 ENCOUNTER — Other Ambulatory Visit (HOSPITAL_COMMUNITY)
Admission: RE | Admit: 2023-11-17 | Discharge: 2023-11-17 | Disposition: A | Discharge status: Home / Self Care | Payer: Medicaid Other | Source: Ambulatory Visit | Attending: Internal Medicine | Admitting: Internal Medicine

## 2023-11-17 ENCOUNTER — Other Ambulatory Visit: Payer: Self-pay

## 2023-11-17 ENCOUNTER — Ambulatory Visit: Payer: Medicaid Other | Admitting: Internal Medicine

## 2023-11-17 ENCOUNTER — Other Ambulatory Visit: Payer: Medicaid Other

## 2023-11-17 DIAGNOSIS — B20 Human immunodeficiency virus [HIV] disease: Secondary | ICD-10-CM

## 2023-11-17 DIAGNOSIS — Z113 Encounter for screening for infections with a predominantly sexual mode of transmission: Secondary | ICD-10-CM | POA: Insufficient documentation

## 2023-11-17 NOTE — Addendum Note (Signed)
Addended by: Harley Alto on: 11/17/2023 11:05 AM   Modules accepted: Orders

## 2023-11-17 NOTE — Addendum Note (Signed)
Addended by: Marcell Anger on: 11/17/2023 10:54 AM   Modules accepted: Orders

## 2023-11-18 LAB — URINE CYTOLOGY ANCILLARY ONLY
Chlamydia: NEGATIVE
Comment: NEGATIVE
Comment: NORMAL
Neisseria Gonorrhea: NEGATIVE

## 2023-11-18 LAB — T-HELPER CELL (CD4) - (RCID CLINIC ONLY)
CD4 % Helper T Cell: 35 % (ref 33–65)
CD4 T Cell Abs: 834 /uL (ref 400–1790)

## 2023-11-19 LAB — HIV-1 RNA QUANT-NO REFLEX-BLD
HIV 1 RNA Quant: 284 {copies}/mL — ABNORMAL HIGH
HIV-1 RNA Quant, Log: 2.45 {Log} — ABNORMAL HIGH

## 2023-11-25 ENCOUNTER — Ambulatory Visit: Payer: Medicaid Other | Admitting: Internal Medicine

## 2023-11-30 ENCOUNTER — Encounter: Payer: Self-pay | Admitting: Gastroenterology

## 2023-11-30 ENCOUNTER — Ambulatory Visit (INDEPENDENT_AMBULATORY_CARE_PROVIDER_SITE_OTHER): Payer: Medicaid Other | Admitting: Gastroenterology

## 2023-11-30 VITALS — BP 110/82 | HR 64 | Ht 71.0 in | Wt 179.4 lb

## 2023-11-30 DIAGNOSIS — R110 Nausea: Secondary | ICD-10-CM | POA: Diagnosis not present

## 2023-11-30 DIAGNOSIS — R109 Unspecified abdominal pain: Secondary | ICD-10-CM

## 2023-11-30 DIAGNOSIS — R194 Change in bowel habit: Secondary | ICD-10-CM

## 2023-11-30 DIAGNOSIS — R140 Abdominal distension (gaseous): Secondary | ICD-10-CM | POA: Diagnosis not present

## 2023-11-30 DIAGNOSIS — K529 Noninfective gastroenteritis and colitis, unspecified: Secondary | ICD-10-CM

## 2023-11-30 DIAGNOSIS — G8929 Other chronic pain: Secondary | ICD-10-CM

## 2023-11-30 NOTE — Progress Notes (Unsigned)
11/30/2023 Pedro Owens [id number] October 19, 1991   HISTORY OF PRESENT ILLNESS:   Past Medical History:  Diagnosis Date   ADHD (attention deficit hyperactivity disorder)    Anal warts    Anxiety    Asthma    Bipolar disorder (HCC)    Chronic bronchitis (HCC)    Chronic pain syndrome    Convulsions (HCC) 08/14/2015   Depression    Gonorrhea 09/08/11   Headache    "weekly" (11/06/2016)   Hemorrhoids, internal, with bleeding 08/25/2011   Hepatitis C    Herpes    HIV infection (HCC)    Hypertension    ILEITIS 09/24/2010   Qualifier: Diagnosis of  By: Candice Camp CMA (AAMA), Dottie     Lymphoid hyperplasia, reactive    Marijuana abuse    Migraine    "monthly" (11/06/2016)   Pseudoseizures    Seizures (HCC)    "don't know why I have them" (11/06/2016)   Syphilis    Past Surgical History:  Procedure Laterality Date   ANKLE SURGERY Bilateral 2005   Screw & Pins   ANKLE SURGERY Right 2008   "replaced pins & screws"   FOOT SURGERY Bilateral 2005   "bone spurs"   I & D EXTREMITY Right 06/26/2016   Procedure: IRRIGATION AND DEBRIDEMENT RIGHT FOREARM;  Surgeon: Betha Loa, MD;  Location: [facility] OR;  Service: Orthopedics;  Laterality: Right;   I & D EXTREMITY Right 11/04/2016   Procedure: IRRIGATION AND DEBRIDEMENT EXTREMITY;  Surgeon: Dairl Ponder, MD;  Location: [facility] OR;  Service: Orthopedics;  Laterality: Right;   I & D EXTREMITY Right 11/06/2016   Procedure: IRRIGATION AND DEBRIDEMENT right forarm;  Surgeon: Dairl Ponder, MD;  Location: [facility] OR;  Service: Orthopedics;  Laterality: Right;   TEE WITHOUT CARDIOVERSION N/A 11/19/2017   Procedure: TRANSESOPHAGEAL ECHOCARDIOGRAM (TEE);  Surgeon: Pricilla Riffle, MD;  Location: [facility] ENDOSCOPY;  Service: Cardiovascular;  Laterality: N/A;   VIDEO ASSISTED THORACOSCOPY (VATS)/EMPYEMA Left 11/26/2017   Procedure: VIDEO ASSISTED THORACOSCOPY (VATS)/DRAIN EMPYEMA;  Surgeon: Kerin Perna, MD;  Location: [facility] OR;  Service: Thoracic;  Laterality:  Left;    reports that he has been smoking cigarettes. He has a 16.5 pack-year smoking history. He has never used smokeless tobacco. He reports current alcohol use. He reports that he does not currently use drugs after having used the following drugs: Cocaine, Benzodiazepines, Oxycodone, Marijuana, and Heroin. Frequency: 2.00 times per week. family history includes Diabetes in his maternal uncle and mother; Heart disease in his maternal grandmother; Hyperlipidemia in his mother; Hypertension in his mother; Hypothyroidism in his mother; Irritable bowel syndrome in his mother; Leukemia in an other family member. Allergies  Allergen Reactions   Acetaminophen Diarrhea, Nausea And Vomiting and Other (See Comments)    Flares up crohns disease    Other Other (See Comments)    IV CONTRAST    Vicodin [Hydrocodone-Acetaminophen] Nausea And Vomiting   Dilaudid [Hydromorphone Hcl] Hives and Rash      Outpatient Encounter Medications as of 11/30/2023  Medication Sig   bictegravir-emtricitabine-tenofovir AF (BIKTARVY) 50-200-25 MG TABS tablet Take 1 tablet by mouth daily.   ibuprofen (ADVIL) 600 MG tablet Take 1 tablet (600 mg total) by mouth every 6 (six) hours as needed.   nicotine (NICODERM CQ - DOSED IN MG/24 HOURS) 21 mg/24hr patch Place 1 patch (21 mg total) onto the skin daily as needed (Nicotine withdrawal symptoms.).   ondansetron (ZOFRAN) 4 MG tablet Take 1 tablet (4 mg total) by mouth  every 8 (eight) hours as needed for nausea or vomiting.   oxyCODONE (OXY IR/ROXICODONE) 5 MG immediate release tablet Take 1-2 tablets (5-10 mg total) by mouth every 4 (four) hours as needed for moderate pain (pain score 4-6) or severe pain (pain score 7-10).   valACYclovir (VALTREX) 500 MG tablet Take 1 tablet (500 mg total) by mouth daily. (Patient taking differently: Take 500 mg by mouth daily as needed (flare ups).)   [DISCONTINUED] divalproex (DEPAKOTE) 500 MG DR tablet Take 1 tablet (500 mg total) by mouth 2  (two) times daily.   No facility-administered encounter medications on file as of 11/30/2023.     REVIEW OF SYSTEMS  : All other systems reviewed and negative except where noted in the History of Present Illness.   PHYSICAL EXAM: BP 110/82   Pulse 64   Ht 5\' 11"  (1.803 m)   Wt 179 lb 6 oz (81.4 kg)   BMI 25.02 kg/m  General: Well developed [place] male in no acute distress Head: Normocephalic and atraumatic Eyes:  sclerae anicteric,conjunctive pink. Ears: Normal auditory acuity Neck: Supple, no masses.  Lungs: Clear throughout to auscultation Heart: Regular rate and rhythm Abdomen: Soft, nontender, non distended. No masses or hepatomegaly noted. Normal bowel sounds Rectal: *** Musculoskeletal: Symmetrical with no gross deformities  Skin: No lesions on visible extremities Extremities: No edema  Neurological: Alert oriented x 4, grossly nonfocal Psychological:  Alert and cooperative. Normal mood and affect  ASSESSMENT AND PLAN:    CC:  No ref. provider found

## 2023-11-30 NOTE — Patient Instructions (Signed)
You have been scheduled for a colonoscopy. Please follow written instructions given to you at your visit today.   If you use inhalers (even only as needed), please bring them with you on the day of your procedure.  DO NOT TAKE 7 DAYS PRIOR TO TEST- Trulicity (dulaglutide) Ozempic, Wegovy (semaglutide) Mounjaro (tirzepatide) Bydureon Bcise (exanatide extended release)  DO NOT TAKE 1 DAY PRIOR TO YOUR TEST Rybelsus (semaglutide) Adlyxin (lixisenatide) Victoza (liraglutide) Byetta (exanatide) _____________________________________________________________ Due to recent changes in healthcare laws, you may see the results of your imaging and laboratory studies on MyChart before your provider has had a chance to review them.  We understand that in some cases there may be results that are confusing or concerning to you. Not all laboratory results come back in the same time frame and the provider may be waiting for multiple results in order to interpret others.  Please give [place] 48 hours in order for your provider to thoroughly review all the results before contacting the office for clarification of your results.  _______________________________________________________  If your blood pressure at your visit was 140/90 or greater, please contact your primary care physician to follow up on this.  _______________________________________________________  If you are age 25 or older, your body mass index should be between 23-30. Your Body mass index is 25.02 kg/m. If this is out of the aforementioned range listed, please consider follow up with your Primary Care Provider.  If you are age 73 or younger, your body mass index should be between 19-25. Your Body mass index is 25.02 kg/m. If this is out of the aformentioned range listed, please consider follow up with your Primary Care Provider.   ________________________________________________________  The [facility] providers would like to encourage you to  use [facility] to communicate with providers for non-urgent requests or questions.  Due to long hold times on the telephone, sending your provider a message by [facility] may be a faster and more efficient way to get a response.  Please allow 48 business hours for a response.  Please remember that this is for non-urgent requests.  _______________________________________________________  Thank you for trusting me with your gastrointestinal care!   Doug Sou, PA

## 2023-12-02 NOTE — Progress Notes (Signed)
Agree with assessment and plan as outlined.

## 2023-12-13 ENCOUNTER — Ambulatory Visit: Payer: Medicaid Other | Admitting: Podiatry

## 2023-12-14 ENCOUNTER — Telehealth: Payer: Self-pay

## 2023-12-14 NOTE — Telephone Encounter (Signed)
 Detectable Viral Load Intervention (DVL)  Most recent VL:  HIV 1 RNA Quant  Date Value Ref Range Status  11/17/2023 284 (H) Copies/mL Final  09/22/2023 60 copies/mL Corrected    Comment:    (NOTE) The reportable range for this assay is 20 to 10,000,000 copies HIV-1 RNA/mL.   05/06/2023 26 (H) Copies/mL Final    Last Clinic Visit: 11/17/23  Current ART regimen: Biktarvy  Appointment status: patient has future appointment scheduled  Medication last dispensed (per chart review):    Dispensed Days Supply Quantity Provider Pharmacy  Biktarvy 50 mg-200 mg-25 mg tablet 12/02/2023 30 30 each Vu, Tonita Phoenix, MD [facility]  Biktarvy 50 mg-200 mg-25 mg tablet 11/02/2023 30 30 each Vu, Tonita Phoenix, MD [facility]   Medication Adherence   What pharmacy do you use for your ART?   Do you pick up your medication at the pharmacy or is it mailed to you?   How often do you miss a dose your ART?   Are you experiencing any side effects with your ART?   Are you having any trouble remembering what medication(s) you are supposed to take or how you are supposed to take them?   What helps you remember to take your medication(s)?    Barriers to Care   Lack of transportation to medical appointments?   2. Housing instability?   3. If you are currently employed, are you having difficulty taking time off of work for medical appointments?   4. Financial concerns (rent, utilities, etc.)   5. Lack of consistent access to food?   6. Trouble remembering and attending your appointments?   7. Are you experiencing any other barriers that make it hard for you to come to appointments or take medication regularly?    Interventions   Called patient to discuss medication adherence and possible barriers to care. Not able to reach at this time. Patient's mother did answer call. Requested she have patient call back to answer medication adherence questions.  Pt has appt on 3/3. Can  address at follow up appt as well. Juanita Laster, RMA

## 2023-12-20 ENCOUNTER — Ambulatory Visit: Payer: Medicaid Other | Admitting: Internal Medicine

## 2024-01-06 ENCOUNTER — Telehealth: Payer: Self-pay | Admitting: Gastroenterology

## 2024-01-06 ENCOUNTER — Encounter: Payer: Medicaid Other | Admitting: Gastroenterology

## 2024-01-06 NOTE — Telephone Encounter (Signed)
 Patient no showed procedures for the second time now. He will have one more opportunity to schedule an office visit with me. If he no shows any more appointments he will be discharged from the practice.

## 2024-01-06 NOTE — Telephone Encounter (Signed)
 Good Afternoon Dr. Adela Lank  I tried calling this patient this afternoon at 1:50 to see if he was coming for his procedure.  I left a message asking him to call us if he was running late.   I will NO SHOW this patient.-- [employer]

## 2024-01-24 DIAGNOSIS — Z79899 Other long term (current) drug therapy: Secondary | ICD-10-CM | POA: Insufficient documentation

## 2024-01-24 DIAGNOSIS — J020 Streptococcal pharyngitis: Secondary | ICD-10-CM | POA: Insufficient documentation

## 2024-01-24 DIAGNOSIS — I10 Essential (primary) hypertension: Secondary | ICD-10-CM | POA: Insufficient documentation

## 2024-01-24 DIAGNOSIS — Z21 Asymptomatic human immunodeficiency virus [HIV] infection status: Secondary | ICD-10-CM | POA: Insufficient documentation

## 2024-01-24 DIAGNOSIS — J029 Acute pharyngitis, unspecified: Secondary | ICD-10-CM | POA: Diagnosis present

## 2024-01-25 ENCOUNTER — Encounter (HOSPITAL_COMMUNITY): Payer: Self-pay | Admitting: Emergency Medicine

## 2024-01-25 ENCOUNTER — Other Ambulatory Visit: Payer: Self-pay

## 2024-01-25 ENCOUNTER — Emergency Department (HOSPITAL_COMMUNITY)
Admission: EM | Admit: 2024-01-25 | Discharge: 2024-01-25 | Disposition: A | Discharge status: Home / Self Care | Attending: Emergency Medicine | Admitting: Emergency Medicine

## 2024-01-25 DIAGNOSIS — J020 Streptococcal pharyngitis: Secondary | ICD-10-CM

## 2024-01-25 LAB — GROUP A STREP BY PCR: Group A Strep by PCR: DETECTED — AB

## 2024-01-25 MED ORDER — DEXAMETHASONE 10 MG/ML FOR PEDIATRIC ORAL USE
10.0000 mg | Freq: Once | INTRAMUSCULAR | Status: AC
  Administered 2024-01-25: 10 mg via ORAL
  Filled 2024-01-25: qty 1

## 2024-01-25 MED ORDER — IBUPROFEN 600 MG PO TABS: 600.0000 mg | ORAL_TABLET | Freq: Four times a day (QID) | ORAL | 0 refills | Status: DC | PRN

## 2024-01-25 MED ORDER — AMOXICILLIN 500 MG PO CAPS: 500.0000 mg | ORAL_CAPSULE | Freq: Two times a day (BID) | ORAL | 0 refills | Status: DC

## 2024-01-25 NOTE — Discharge Instructions (Addendum)
 As discussed, you did test positive for strep which is most likely causing your symptoms.  Will place on antibiotics for treatment of this.  Will also recommend anti-inflammatory for treatment of pain.  Please do not hesitate to return to emergency department if the worrisome signs and symptoms we discussed become apparent.

## 2024-01-25 NOTE — ED Triage Notes (Signed)
 Patient coming to ED for evaluation of sore throat.  Reports symptoms started on Friday.  Has noticed swelling is worse on R side.  No reports of fever.  Difficulty swallowing.

## 2024-01-25 NOTE — ED Provider Notes (Signed)
 [facility] EMERGENCY DEPARTMENT AT [facility] Provider Note   CSN: [id number] Arrival date & time: 01/24/24  2320     History  Chief Complaint  Patient presents with   Sore Throat    GRAY DOERING is a 33 y.o. male.   Sore Throat   33 year old male presents emergency department with complaints of sore throat.  States symptoms began on Friday.  Does report pain slightly worse on his right throat compared to his left.  Reports pain with swallowing but still able to swallow foods and liquids.  Denies any difficulty breathing, feeling of throat closing on him.  Denies any cough, congestion, fever, chills, night sweats.  Past medical history significant for HIV , ADHD, chronic pain syndrome, hypertension, polysubstance use, chronic appetite C, IV drug use  Home Medications Prior to Admission medications   Medication Sig Start Date End Date Taking? Authorizing Provider  amoxicillin (AMOXIL) 500 MG capsule Take 1 capsule (500 mg total) by mouth 2 (two) times daily. 01/25/24  Yes Sherian Maroon A, PA  ibuprofen (ADVIL) 600 MG tablet Take 1 tablet (600 mg total) by mouth every 6 (six) hours as needed. 01/25/24  Yes Sherian Maroon A, PA  bictegravir-emtricitabine-tenofovir AF (BIKTARVY) 50-200-25 MG TABS tablet Take 1 tablet by mouth daily. 09/23/23   Comer, Belia Heman, MD  nicotine (NICODERM CQ - DOSED IN MG/24 HOURS) 21 mg/24hr patch Place 1 patch (21 mg total) onto the skin daily as needed (Nicotine withdrawal symptoms.). 09/25/23   Burnadette Pop, MD  ondansetron (ZOFRAN) 4 MG tablet Take 1 tablet (4 mg total) by mouth every 8 (eight) hours as needed for nausea or vomiting. 05/06/23   Judyann Munson, MD  oxyCODONE (OXY IR/ROXICODONE) 5 MG immediate release tablet Take 1-2 tablets (5-10 mg total) by mouth every 4 (four) hours as needed for moderate pain (pain score 4-6) or severe pain (pain score 7-10). 09/25/23   Burnadette Pop, MD  valACYclovir (VALTREX) 500 MG tablet Take 1 tablet  (500 mg total) by mouth daily. Patient taking differently: Take 500 mg by mouth daily as needed (flare ups). 03/03/23 02/26/24  Vu, Gershon Mussel T, MD  divalproex (DEPAKOTE) 500 MG DR tablet Take 1 tablet (500 mg total) by mouth 2 (two) times daily. 02/28/16 02/28/16  Lindalou Hose, MD      Allergies    Acetaminophen, Other, Vicodin [hydrocodone-acetaminophen], and Dilaudid [hydromorphone hcl]    Review of Systems   Review of Systems  All other systems reviewed and are negative.   Physical Exam Updated Vital Signs BP (!) 149/87   Pulse 84   Temp 99.2 F (37.3 C) (Oral)   Resp 15   Ht 5\' 11"  (1.803 m)   Wt 82.6 kg   SpO2 98%   BMI 25.38 kg/m  Physical Exam Vitals and nursing note reviewed.  Constitutional:      General: He is not in acute distress.    Appearance: He is well-developed.  HENT:     Head: Normocephalic and atraumatic.     Mouth/Throat:     Comments: Posterior pharyngeal erythema.  Tonsils 2+ bilaterally without exudate.  No sublingual extremity or swelling.  No trismus.  No submandibular or sublingual swelling.  Uvula midline and right symmetrical inflammation. Eyes:     Conjunctiva/sclera: Conjunctivae normal.  Cardiovascular:     Rate and Rhythm: Normal rate and regular rhythm.     Heart sounds: No murmur heard. Pulmonary:     Effort: Pulmonary effort is normal. No respiratory  distress.     Breath sounds: Normal breath sounds.  Abdominal:     Palpations: Abdomen is soft.     Tenderness: There is no abdominal tenderness.  Musculoskeletal:        General: No swelling.     Cervical back: Neck supple.  Skin:    General: Skin is warm and dry.     Capillary Refill: Capillary refill takes less than 2 seconds.  Neurological:     Mental Status: He is alert.  Psychiatric:        Mood and Affect: Mood normal.     ED Results / Procedures / Treatments   Labs (all labs ordered are listed, but only abnormal results are displayed) Labs Reviewed  GROUP A STREP BY PCR  - Abnormal; Notable for the following components:      Result Value   Group A Strep by PCR DETECTED (*)    All other components within normal limits    EKG None  Radiology No results found.  Procedures Procedures    Medications Ordered in ED Medications  dexamethasone (DECADRON) 10 MG/ML injection for Pediatric ORAL use 10 mg (has no administration in time range)    ED Course/ Medical Decision Making/ A&P                                 Medical Decision Making Risk Prescription drug management.   This patient presents to the ED for concern of sore throat, this involves an extensive number of treatment options, and is a complaint that carries with it a high risk of complications and morbidity.  The differential diagnosis includes viral pharyngitis, strep pharyngitis, Ludwig angina, PTA, other   Co morbidities that complicate the patient evaluation  See HPI   Additional history obtained:  Additional history obtained from EMR External records from outside source obtained and reviewed including hospital records   Lab Tests:  I Ordered, and personally interpreted labs.  The pertinent results include: Group A strep positive   Imaging Studies ordered:  N/a   Cardiac Monitoring: / EKG:  The patient was maintained on a cardiac monitor.  I personally viewed and interpreted the cardiac monitored which showed an underlying rhythm of: sinus rhythm   Consultations Obtained:  N/a   Problem List / ED Course / Critical interventions / Medication management  Strep pharyngitis I ordered medication including Decadron   Reevaluation of the patient after these medicines showed that the patient improved I have reviewed the patients home medicines and have made adjustments as needed   Social Determinants of Health:  Chronic cigarette use.  Polysubstance use.   Test / Admission - Considered:  Strep pharyngitis Vitals signs significant for htn bp 149/87.  Otherwise within normal range and stable throughout visit. Laboratory studies significant for: See above 33 year old male presents emergency department with complaints of sore throat.  States symptoms began on Friday.  Does report pain slightly worse on his right throat compared to his left.  Reports pain with swallowing but still able to swallow foods and liquids.  Denies any difficulty breathing, feeling of throat closing on him.  Denies any cough, congestion, fever, chills, night sweats. On exam, posterior pharyngeal erythema with bilateral tonsillar enlargement.  No clinical evidence of Ludwig angina, PTA.  Strep testing positive.  Suspect the patient symptoms likely secondary to group A strep.  Will treat with antibiotics, recommend further symptomatic therapy described in AVS.  Recommend follow-up with PCP in the outpatient setting for reassessment.  Treatment plan discussed with patient and he acknowledged understanding was agreeable to said plan.  Patient overall well-appearing, afebrile in no acute distress, tolerating p.o. without difficulty. Worrisome signs and symptoms were discussed with the patient, and the patient acknowledged understanding to return to the ED if noticed. Patient was stable upon discharge.          Final Clinical Impression(s) / ED Diagnoses Final diagnoses:  Strep pharyngitis    Rx / DC Orders ED Discharge Orders          Ordered    amoxicillin (AMOXIL) 500 MG capsule  2 times daily        01/25/24 0356    ibuprofen (ADVIL) 600 MG tablet  Every 6 hours PRN        01/25/24 0356              Peter Garter, PA 01/25/24 0410    Sabas Sous, MD 01/25/24 [phone]

## 2024-01-31 ENCOUNTER — Emergency Department (HOSPITAL_COMMUNITY)

## 2024-01-31 ENCOUNTER — Inpatient Hospital Stay (HOSPITAL_COMMUNITY)
Admission: EM | Admit: 2024-01-31 | Discharge: 2024-02-07 | DRG: 872 | Disposition: A | Discharge status: Home / Self Care | Attending: Internal Medicine | Admitting: Internal Medicine

## 2024-01-31 ENCOUNTER — Other Ambulatory Visit: Payer: Self-pay

## 2024-01-31 ENCOUNTER — Encounter (HOSPITAL_COMMUNITY): Payer: Self-pay | Admitting: Internal Medicine

## 2024-01-31 DIAGNOSIS — Z21 Asymptomatic human immunodeficiency virus [HIV] infection status: Secondary | ICD-10-CM | POA: Diagnosis present

## 2024-01-31 DIAGNOSIS — B009 Herpesviral infection, unspecified: Secondary | ICD-10-CM | POA: Diagnosis present

## 2024-01-31 DIAGNOSIS — F17210 Nicotine dependence, cigarettes, uncomplicated: Secondary | ICD-10-CM | POA: Diagnosis present

## 2024-01-31 DIAGNOSIS — R7881 Bacteremia: Secondary | ICD-10-CM

## 2024-01-31 DIAGNOSIS — Z79899 Other long term (current) drug therapy: Secondary | ICD-10-CM

## 2024-01-31 DIAGNOSIS — Z885 Allergy status to narcotic agent status: Secondary | ICD-10-CM | POA: Diagnosis not present

## 2024-01-31 DIAGNOSIS — I10 Essential (primary) hypertension: Secondary | ICD-10-CM | POA: Diagnosis present

## 2024-01-31 DIAGNOSIS — A419 Sepsis, unspecified organism: Secondary | ICD-10-CM | POA: Diagnosis not present

## 2024-01-31 DIAGNOSIS — G894 Chronic pain syndrome: Secondary | ICD-10-CM | POA: Diagnosis present

## 2024-01-31 DIAGNOSIS — L02811 Cutaneous abscess of head [any part, except face]: Secondary | ICD-10-CM | POA: Diagnosis present

## 2024-01-31 DIAGNOSIS — L02821 Furuncle of head [any part, except face]: Secondary | ICD-10-CM | POA: Diagnosis present

## 2024-01-31 DIAGNOSIS — L299 Pruritus, unspecified: Secondary | ICD-10-CM | POA: Diagnosis present

## 2024-01-31 DIAGNOSIS — Z888 Allergy status to other drugs, medicaments and biological substances status: Secondary | ICD-10-CM

## 2024-01-31 DIAGNOSIS — B182 Chronic viral hepatitis C: Secondary | ICD-10-CM | POA: Diagnosis present

## 2024-01-31 DIAGNOSIS — L739 Follicular disorder, unspecified: Secondary | ICD-10-CM | POA: Diagnosis present

## 2024-01-31 DIAGNOSIS — A400 Sepsis due to streptococcus, group A: Principal | ICD-10-CM | POA: Diagnosis present

## 2024-01-31 DIAGNOSIS — F445 Conversion disorder with seizures or convulsions: Secondary | ICD-10-CM | POA: Diagnosis present

## 2024-01-31 DIAGNOSIS — Z86711 Personal history of pulmonary embolism: Secondary | ICD-10-CM

## 2024-01-31 DIAGNOSIS — K5900 Constipation, unspecified: Secondary | ICD-10-CM | POA: Diagnosis present

## 2024-01-31 DIAGNOSIS — Z8249 Family history of ischemic heart disease and other diseases of the circulatory system: Secondary | ICD-10-CM

## 2024-01-31 DIAGNOSIS — R220 Localized swelling, mass and lump, head: Secondary | ICD-10-CM

## 2024-01-31 DIAGNOSIS — F319 Bipolar disorder, unspecified: Secondary | ICD-10-CM | POA: Diagnosis present

## 2024-01-31 DIAGNOSIS — B950 Streptococcus, group A, as the cause of diseases classified elsewhere: Secondary | ICD-10-CM | POA: Insufficient documentation

## 2024-01-31 DIAGNOSIS — T50996A Underdosing of other drugs, medicaments and biological substances, initial encounter: Secondary | ICD-10-CM | POA: Diagnosis present

## 2024-01-31 DIAGNOSIS — K611 Rectal abscess: Secondary | ICD-10-CM | POA: Diagnosis present

## 2024-01-31 LAB — CBC WITH DIFFERENTIAL/PLATELET
Abs Immature Granulocytes: 0.32 10*3/uL — ABNORMAL HIGH (ref 0.00–0.07)
Basophils Absolute: 0 10*3/uL (ref 0.0–0.1)
Basophils Relative: 0 %
Eosinophils Absolute: 0 10*3/uL (ref 0.0–0.5)
Eosinophils Relative: 0 %
HCT: 47.3 % (ref 39.0–52.0)
Hemoglobin: 15.6 g/dL (ref 13.0–17.0)
Immature Granulocytes: 1 %
Lymphocytes Relative: 9 %
Lymphs Abs: 2.1 10*3/uL (ref 0.7–4.0)
MCH: 29.1 pg (ref 26.0–34.0)
MCHC: 33 g/dL (ref 30.0–36.0)
MCV: 88.2 fL (ref 80.0–100.0)
Monocytes Absolute: 2.7 10*3/uL — ABNORMAL HIGH (ref 0.1–1.0)
Monocytes Relative: 11 %
Neutro Abs: 19.3 10*3/uL — ABNORMAL HIGH (ref 1.7–7.7)
Neutrophils Relative %: 79 %
Platelets: 329 10*3/uL (ref 150–400)
RBC: 5.36 MIL/uL (ref 4.22–5.81)
RDW: 13 % (ref 11.5–15.5)
WBC: 24.5 10*3/uL — ABNORMAL HIGH (ref 4.0–10.5)
nRBC: 0 % (ref 0.0–0.2)

## 2024-01-31 LAB — COMPREHENSIVE METABOLIC PANEL WITH GFR
ALT: 76 U/L — ABNORMAL HIGH (ref 0–44)
AST: 76 U/L — ABNORMAL HIGH (ref 15–41)
Albumin: 3.4 g/dL — ABNORMAL LOW (ref 3.5–5.0)
Alkaline Phosphatase: 61 U/L (ref 38–126)
Anion gap: 12 (ref 5–15)
BUN: 16 mg/dL (ref 6–20)
CO2: 23 mmol/L (ref 22–32)
Calcium: 8.6 mg/dL — ABNORMAL LOW (ref 8.9–10.3)
Chloride: 99 mmol/L (ref 98–111)
Creatinine, Ser: 0.91 mg/dL (ref 0.61–1.24)
GFR, Estimated: >60 mL/min (ref >60)
Glucose, Bld: 101 mg/dL — ABNORMAL HIGH (ref 70–99)
Potassium: 4.4 mmol/L (ref 3.5–5.1)
Sodium: 134 mmol/L — ABNORMAL LOW (ref 135–145)
Total Bilirubin: 2.1 mg/dL — ABNORMAL HIGH (ref 0.0–1.2)
Total Protein: 7.9 g/dL (ref 6.5–8.1)

## 2024-01-31 LAB — I-STAT CG4 LACTIC ACID, ED: Lactic Acid, Venous: 1.6 mmol/L (ref 0.5–1.9)

## 2024-01-31 MED ORDER — VANCOMYCIN HCL 1750 MG/350ML IV SOLN
1750.0000 mg | Freq: Two times a day (BID) | INTRAVENOUS | Status: DC
  Administered 2024-01-31: 1750 mg via INTRAVENOUS
  Filled 2024-01-31 (×2): qty 350

## 2024-01-31 MED ORDER — LACTATED RINGERS IV BOLUS
1000.0000 mL | Freq: Once | INTRAVENOUS | Status: AC
  Administered 2024-01-31: 1000 mL via INTRAVENOUS

## 2024-01-31 MED ORDER — BICTEGRAVIR-EMTRICITAB-TENOFOV 50-200-25 MG PO TABS
1.0000 | ORAL_TABLET | Freq: Every day | ORAL | Status: DC
  Administered 2024-02-01 – 2024-02-07 (×7): 1 via ORAL
  Filled 2024-01-31 (×7): qty 1

## 2024-01-31 MED ORDER — METOCLOPRAMIDE HCL 5 MG/ML IJ SOLN
10.0000 mg | Freq: Once | INTRAMUSCULAR | Status: AC
  Administered 2024-01-31: 10 mg via INTRAVENOUS
  Filled 2024-01-31: qty 2

## 2024-01-31 MED ORDER — MORPHINE SULFATE (PF) 4 MG/ML IV SOLN
4.0000 mg | Freq: Once | INTRAVENOUS | Status: AC
  Administered 2024-01-31: 4 mg via INTRAVENOUS
  Filled 2024-01-31: qty 1

## 2024-01-31 MED ORDER — KETOROLAC TROMETHAMINE 30 MG/ML IJ SOLN
30.0000 mg | Freq: Four times a day (QID) | INTRAMUSCULAR | Status: AC | PRN
  Administered 2024-01-31 – 2024-02-02 (×8): 30 mg via INTRAVENOUS
  Filled 2024-01-31 (×9): qty 1

## 2024-01-31 MED ORDER — ONDANSETRON HCL 4 MG/2ML IJ SOLN: 4.0000 mg | Freq: Three times a day (TID) | INTRAMUSCULAR | Status: DC | PRN

## 2024-01-31 MED ORDER — MELATONIN 3 MG PO TABS
6.0000 mg | ORAL_TABLET | Freq: Every evening | ORAL | Status: DC | PRN
  Administered 2024-02-01 – 2024-02-06 (×5): 6 mg via ORAL
  Filled 2024-01-31 (×5): qty 2

## 2024-01-31 MED ORDER — SODIUM CHLORIDE 0.9% FLUSH
3.0000 mL | Freq: Two times a day (BID) | INTRAVENOUS | Status: DC
  Administered 2024-01-31 – 2024-02-07 (×11): 3 mL via INTRAVENOUS

## 2024-01-31 MED ORDER — FENTANYL CITRATE PF 50 MCG/ML IJ SOSY
50.0000 ug | PREFILLED_SYRINGE | INTRAMUSCULAR | Status: DC | PRN
  Administered 2024-01-31: 50 ug via INTRAVENOUS
  Filled 2024-01-31: qty 1

## 2024-01-31 MED ORDER — IOHEXOL 300 MG/ML  SOLN
100.0000 mL | Freq: Once | INTRAMUSCULAR | Status: AC | PRN
  Administered 2024-01-31: 100 mL via INTRAVENOUS

## 2024-01-31 MED ORDER — IOHEXOL 300 MG/ML  SOLN
75.0000 mL | Freq: Once | INTRAMUSCULAR | Status: AC | PRN
  Administered 2024-01-31: 75 mL via INTRAVENOUS

## 2024-01-31 MED ORDER — NICOTINE 14 MG/24HR TD PT24
14.0000 mg | MEDICATED_PATCH | Freq: Every day | TRANSDERMAL | Status: DC | PRN
  Administered 2024-01-31 – 2024-02-03 (×4): 14 mg via TRANSDERMAL
  Filled 2024-01-31 (×4): qty 1

## 2024-01-31 MED ORDER — LACTATED RINGERS IV SOLN: INTRAVENOUS | Status: DC

## 2024-01-31 MED ORDER — PIPERACILLIN-TAZOBACTAM 3.375 G IVPB
3.3750 g | Freq: Three times a day (TID) | INTRAVENOUS | Status: DC
  Administered 2024-01-31 – 2024-02-04 (×11): 3.375 g via INTRAVENOUS
  Filled 2024-01-31 (×11): qty 50

## 2024-01-31 MED ORDER — DIPHENHYDRAMINE HCL 50 MG/ML IJ SOLN
12.5000 mg | Freq: Once | INTRAMUSCULAR | Status: AC
  Administered 2024-01-31: 12.5 mg via INTRAVENOUS
  Filled 2024-01-31: qty 1

## 2024-01-31 MED ORDER — BICTEGRAVIR-EMTRICITAB-TENOFOV 50-200-25 MG PO TABS
1.0000 | ORAL_TABLET | Freq: Every day | ORAL | Status: DC
  Filled 2024-01-31: qty 1

## 2024-01-31 MED ORDER — ONDANSETRON HCL 4 MG/2ML IJ SOLN
4.0000 mg | Freq: Four times a day (QID) | INTRAMUSCULAR | Status: DC | PRN
  Administered 2024-02-02: 4 mg via INTRAVENOUS
  Filled 2024-01-31: qty 2

## 2024-01-31 MED ORDER — POLYETHYLENE GLYCOL 3350 17 G PO PACK
17.0000 g | PACK | Freq: Every day | ORAL | Status: DC | PRN
  Administered 2024-02-03 – 2024-02-06 (×2): 17 g via ORAL
  Filled 2024-01-31 (×2): qty 1

## 2024-01-31 MED ORDER — OXYCODONE HCL 5 MG PO TABS
5.0000 mg | ORAL_TABLET | Freq: Four times a day (QID) | ORAL | Status: DC | PRN
  Administered 2024-02-01 – 2024-02-02 (×3): 5 mg via ORAL
  Filled 2024-01-31 (×4): qty 1

## 2024-01-31 MED ORDER — PIPERACILLIN-TAZOBACTAM 3.375 G IVPB 30 MIN
3.3750 g | Freq: Once | INTRAVENOUS | Status: AC
  Administered 2024-01-31: 3.375 g via INTRAVENOUS
  Filled 2024-01-31: qty 50

## 2024-01-31 MED ORDER — VANCOMYCIN HCL 2000 MG/400ML IV SOLN
2000.0000 mg | Freq: Once | INTRAVENOUS | Status: AC
  Administered 2024-01-31: 2000 mg via INTRAVENOUS
  Filled 2024-01-31: qty 400

## 2024-01-31 MED ORDER — SODIUM CHLORIDE (PF) 0.9 % IJ SOLN
INTRAMUSCULAR | Status: AC
  Filled 2024-01-31: qty 50

## 2024-01-31 NOTE — Progress Notes (Signed)
 ED Pharmacy Antibiotic Sign Off An antibiotic consult was received from an ED provider for vancomycin and Zosyn per pharmacy dosing for rectal and scalp abscesses. A chart review was completed to assess appropriateness.   The following one time order(s) were placed:  Zosyn 3.375 g IV 30 min infusion Vancomycin 2 g IV  Further antibiotic and/or antibiotic pharmacy consults should be ordered by the admitting provider if indicated.   Thank you for allowing pharmacy to be a part of this patient's care.   Lolita Rise, PharmD, BCPS Clinical Pharmacist 01/31/2024 10:46 AM

## 2024-01-31 NOTE — ED Triage Notes (Signed)
 Pt c/o abscesses x2. One on back of head that is causing a headache and the second one is near rectum and cannot have a BM due to the pressure on the abscess. Pt states the second abscess feels warm. No fever noted. No other symptoms reported.

## 2024-01-31 NOTE — Sepsis Progress Note (Signed)
 Sepsis protocol is being followed by [facility].

## 2024-01-31 NOTE — Progress Notes (Signed)
 Pharmacy Antibiotic Note  Pedro Owens is a 33 y.o. male admitted on 01/31/2024 with perirectal pain.  Aaron Aas  Pharmacy has been consulted for vancomycin dosing.  1st dose given in the ED  Plan: Vancomycin 1750mg  IV q12h (AUC 535.2, Scr 0.91) Follow renal function, cultures and clinical course  Height: 5' 11.5" (181.6 cm) Weight: 82.6 kg (182 lb) IBW/kg (Calculated) : 76.45  Temp (24hrs), Avg:100 F (37.8 C), Min:98.7 F (37.1 C), Max:101.2 F (38.4 C)  Recent Labs  Lab 01/31/24 1110 01/31/24 1112  WBC  --  24.5*  CREATININE  --  0.91  LATICACIDVEN 1.6  --     Estimated Creatinine Clearance: 127.3 mL/min (by C-G formula based on SCr of 0.91 mg/dL).    Allergies  Allergen Reactions   Acetaminophen Diarrhea, Nausea And Vomiting and Other (See Comments)    Flares up crohns disease    Vicodin [Hydrocodone-Acetaminophen] Nausea And Vomiting   Dilaudid [Hydromorphone Hcl] Hives and Rash    Antimicrobials this admission: 4/14 zosyn >> 4/14 vanc >>  Dose adjustments this admission:   Microbiology results: 4/14 BCx:   Thank you for allowing pharmacy to be a part of this patient's care. Beau Bound RPh 01/31/2024, 10:36 PM

## 2024-01-31 NOTE — ED Provider Notes (Signed)
 [place] EMERGENCY DEPARTMENT AT [facility] Provider Note   CSN: [id number] Arrival date & time: 01/31/24  0458     History  Chief Complaint  Patient presents with   Abscess   Headache    Pedro Owens is a 33 y.o. male.   Abscess Associated symptoms: headaches   Headache    33 year old patient with medical history significant for HIV intermittently compliant with outpatient management, untreated chronic hepatitis C, IV drug abuse, septic pulmonary embolism, polysubstance abuse, depression, herpes, neurosyphilis, conversion disorder, pseudoseizures, bipolar disorder, hypertension presenting to the emergency department with concern for perirectal and scalp abscess. The patient states that for the past few days he has had worsening pain and swelling on his scalp as well as perirectally. He has not been able to have a bowel movement due to worsening pain and swelling. The swelling around his rectum is also warm.   Home Medications Prior to Admission medications   Medication Sig Start Date End Date Taking? Authorizing Provider  amoxicillin (AMOXIL) 500 MG capsule Take 1 capsule (500 mg total) by mouth 2 (two) times daily. 01/25/24   Peter Garter, PA  bictegravir-emtricitabine-tenofovir AF (BIKTARVY) 50-200-25 MG TABS tablet Take 1 tablet by mouth daily. 09/23/23   Gardiner Barefoot, MD  ibuprofen (ADVIL) 600 MG tablet Take 1 tablet (600 mg total) by mouth every 6 (six) hours as needed. 01/25/24   Peter Garter, PA  nicotine (NICODERM CQ - DOSED IN MG/24 HOURS) 21 mg/24hr patch Place 1 patch (21 mg total) onto the skin daily as needed (Nicotine withdrawal symptoms.). 09/25/23   Burnadette Pop, MD  ondansetron (ZOFRAN) 4 MG tablet Take 1 tablet (4 mg total) by mouth every 8 (eight) hours as needed for nausea or vomiting. 05/06/23   Judyann Munson, MD  oxyCODONE (OXY IR/ROXICODONE) 5 MG immediate release tablet Take 1-2 tablets (5-10 mg total) by mouth every 4 (four) hours  as needed for moderate pain (pain score 4-6) or severe pain (pain score 7-10). 09/25/23   Burnadette Pop, MD  valACYclovir (VALTREX) 500 MG tablet Take 1 tablet (500 mg total) by mouth daily. Patient taking differently: Take 500 mg by mouth daily as needed (flare ups). 03/03/23 02/26/24  Vu, Gershon Mussel T, MD  divalproex (DEPAKOTE) 500 MG DR tablet Take 1 tablet (500 mg total) by mouth 2 (two) times daily. 02/28/16 02/28/16  Lindalou Hose, MD      Allergies    Acetaminophen, Vicodin [hydrocodone-acetaminophen], and Dilaudid [hydromorphone hcl]    Review of Systems   Review of Systems  Neurological:  Positive for headaches.  All other systems reviewed and are negative.   Physical Exam Updated Vital Signs BP 115/73   Pulse 91   Temp (!) 100.4 F (38 C) (Oral)   Resp 17   Ht 5' 11.5" (1.816 m)   Wt 82.6 kg   SpO2 98%   BMI 25.03 kg/m  Physical Exam Vitals and nursing note reviewed. Exam conducted with a chaperone present.  Constitutional:      General: He is not in acute distress.    Appearance: He is well-developed.  HENT:     Head: Normocephalic.     Comments: Left parietal scalp with palpable area of fluctuance and tenderness, no erythema Eyes:     Conjunctiva/sclera: Conjunctivae normal.  Cardiovascular:     Rate and Rhythm: Normal rate and regular rhythm.     Heart sounds: No murmur heard. Pulmonary:     Effort: Pulmonary effort is  normal. No respiratory distress.     Breath sounds: Normal breath sounds.  Abdominal:     Palpations: Abdomen is soft.     Tenderness: There is no abdominal tenderness.  Genitourinary:    Comments: Fluctuance and warmth along the right gluteus, perineum, with internal rectal TTP on the right Musculoskeletal:        General: No swelling.     Cervical back: Neck supple.  Skin:    General: Skin is warm and dry.     Capillary Refill: Capillary refill takes less than 2 seconds.  Neurological:     Mental Status: He is alert.  Psychiatric:         Mood and Affect: Mood normal.     ED Results / Procedures / Treatments   Labs (all labs ordered are listed, but only abnormal results are displayed) Labs Reviewed  CBC WITH DIFFERENTIAL/PLATELET - Abnormal; Notable for the following components:      Result Value   WBC 24.5 (*)    Neutro Abs 19.3 (*)    Monocytes Absolute 2.7 (*)    Abs Immature Granulocytes 0.32 (*)    All other components within normal limits  COMPREHENSIVE METABOLIC PANEL WITH GFR - Abnormal; Notable for the following components:   Sodium 134 (*)    Glucose, Bld 101 (*)    Calcium 8.6 (*)    Albumin 3.4 (*)    AST 76 (*)    ALT 76 (*)    Total Bilirubin 2.1 (*)    All other components within normal limits  CULTURE, BLOOD (ROUTINE X 2)  CULTURE, BLOOD (ROUTINE X 2)  T-HELPER CELLS (CD4) COUNT (NOT AT [facility])  I-STAT CG4 LACTIC ACID, ED    EKG None  Radiology CT ABDOMEN PELVIS W CONTRAST Result Date: 01/31/2024 CLINICAL DATA:  C/f perirectal abscess. EXAM: CT ABDOMEN AND PELVIS WITH CONTRAST TECHNIQUE: Multidetector CT imaging of the abdomen and pelvis was performed using the standard protocol following bolus administration of intravenous contrast. RADIATION DOSE REDUCTION: This exam was performed according to the departmental dose-optimization program which includes automated exposure control, adjustment of the mA and/or kV according to patient size and/or use of iterative reconstruction technique. CONTRAST:  100mL OMNIPAQUE IOHEXOL 300 MG/ML  SOLN COMPARISON:  CT scan abdomen and pelvis from 10/31/2018. FINDINGS: Lower chest: There are patchy atelectatic changes in the visualized lung bases. No overt consolidation. No pleural effusion. The heart is normal in size. No pericardial effusion. Hepatobiliary: The liver is normal in size. Non-cirrhotic configuration. No suspicious mass. These is mild diffuse hepatic steatosis. No intrahepatic or extrahepatic bile duct dilation. No calcified gallstones. Normal gallbladder  wall thickness. No pericholecystic inflammatory changes. Pancreas: Unremarkable. No pancreatic ductal dilatation or surrounding inflammatory changes. Spleen: Within normal limits. No focal lesion. Adrenals/Urinary Tract: Adrenal glands are unremarkable. No suspicious renal mass. No hydronephrosis. No renal or ureteric calculi. Unremarkable urinary bladder. Stomach/Bowel: No disproportionate dilation of the small or large bowel loops. No evidence of abnormal bowel wall thickening or inflammatory changes. The appendix is unremarkable. Vascular/Lymphatic: No ascites or pneumoperitoneum. No abdominal or pelvic lymphadenopathy, by size criteria. No aneurysmal dilation of the major abdominal arteries. Reproductive: Normal size prostate. Symmetric seminal vesicles. Other: There is asymmetric fullness in the perianal region/perineum. There are subtle ill-defined hypoattenuating areas in this region however, no discrete walled-off abscess seen. There is moderate surrounding fat stranding. Findings represent phlegmonous changes/developing abscess. The soft tissues and abdominal wall are otherwise unremarkable. Musculoskeletal: No suspicious osseous lesions. There  are mild multilevel degenerative changes in the visualized spine. 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 IMPRESSION: 1. There is asymmetric fullness in the perianal region/perineum with subtle ill-defined hypoattenuating areas and moderate surrounding fat stranding. Findings represent phlegmonous changes/developing abscess. No discrete walled-off drainable abscess seen at this time. 2. Multiple other nonacute observations, as described above. Electronically Signed   By: Jules Schick M.D.   On: 01/31/2024 15:14    Procedures .Critical Care  Performed by: Ernie Avena, MD Authorized by: Ernie Avena, MD   Critical care provider statement:    Critical care time (minutes):  30   Critical care was necessary to treat or prevent imminent  or life-threatening deterioration of the following conditions:  Sepsis   Critical care was time spent personally by me on the following activities:  Development of treatment plan with patient or surrogate, discussions with consultants, evaluation of patient's response to treatment, examination of patient, ordering and review of laboratory studies, ordering and review of radiographic studies, ordering and performing treatments and interventions, pulse oximetry, re-evaluation of patient's condition and review of old charts     Medications Ordered in ED Medications  fentaNYL (SUBLIMAZE) injection 50 mcg (50 mcg Intravenous Given 01/31/24 1112)  lactated ringers infusion ( Intravenous New Bag/Given 01/31/24 1552)  lactated ringers bolus 1,000 mL (0 mLs Intravenous Stopped 01/31/24 1513)  vancomycin (VANCOREADY) IVPB 2000 mg/400 mL (0 mg Intravenous Stopped 01/31/24 1350)  piperacillin-tazobactam (ZOSYN) IVPB 3.375 g (0 g Intravenous Stopped 01/31/24 1148)  metoCLOPramide (REGLAN) injection 10 mg (10 mg Intravenous Given 01/31/24 1112)  diphenhydrAMINE (BENADRYL) injection 12.5 mg (12.5 mg Intravenous Given 01/31/24 1113)  iohexol (OMNIPAQUE) 300 MG/ML solution 100 mL (100 mLs Intravenous Contrast Given 01/31/24 1408)  morphine (PF) 4 MG/ML injection 4 mg (4 mg Intravenous Given 01/31/24 1552)  iohexol (OMNIPAQUE) 300 MG/ML solution 75 mL (75 mLs Intravenous Contrast Given 01/31/24 1757)    ED Course/ Medical Decision Making/ A&P Clinical Course as of 01/31/24 2025  Mon Jan 31, 2024  1240 WBC(!): 24.5 [JL]  1240 NEUT#(!): 19.3 [JL]    Clinical Course User Index [JL] Ernie Avena, MD                                 Medical Decision Making Amount and/or Complexity of Data Reviewed Labs: ordered. Decision-making details documented in ED Course. Radiology: ordered.  Risk Prescription drug management. Decision regarding hospitalization.     33 year old patient with medical history significant  for HIV intermittently compliant with outpatient management, untreated chronic hepatitis C, IV drug abuse, septic pulmonary embolism, polysubstance abuse, depression, herpes, neurosyphilis, conversion disorder, pseudoseizures, bipolar disorder, hypertension presenting to the emergency department with concern for perirectal and scalp abscess. The patient states that for the past few days he has had worsening pain and swelling on his scalp as well as perirectally. He has not been able to have a bowel movement due to worsening pain and swelling. The swelling around his rectum is also warm.   On arrival, the patient was afebrile, tachycardic heart rate 120, not tachypneic, BP 130/94, saturating 90% on room air. Physical exam as above: Concerning for scalp abscess and perirectal abscess. Fluctuance and warmth along the right gluteus, perineum, with internal rectal TTP on the right.  Septic workup initiated with blood cultures. Will obtain CT of the abdomen pelvis to further evaluate and screening labs and initiate pain medication and IV antibiotics.   Patient spiked  a fever to 101.2 and has been tachycardic with heart rates in the 90s.  Given the presentation, concern for sepsis and code sepsis was initiated and the patient was covered with broad-spectrum antibiotics to include vancomycin and Zosyn.  Laboratory evaluation significant for leukocytosis to 24.5, mildly elevated LFTs with an AST and ALT of 76 and 76.  Blood cultures were collected prior to antibiotic administration.  Initial lactic acid was normal.  CD4 count was collected and pending.  CT imaging of the abdomen pelvis was collected and revealed no perirectal abscess, evidence of perirectal cellulitis.  Patient meets SIRS criteria, sepsis due to concern for this.    FINDINGS:  Lower chest: There are patchy atelectatic changes in the visualized  lung bases. No overt consolidation. No pleural effusion. The heart  is normal in size. No pericardial  effusion.    Hepatobiliary: The liver is normal in size. Non-cirrhotic  configuration. No suspicious mass. These is mild diffuse hepatic  steatosis. No intrahepatic or extrahepatic bile duct dilation. No  calcified gallstones. Normal gallbladder wall thickness. No  pericholecystic inflammatory changes.    Pancreas: Unremarkable. No pancreatic ductal dilatation or  surrounding inflammatory changes.    Spleen: Within normal limits. No focal lesion.    Adrenals/Urinary Tract: Adrenal glands are unremarkable. No  suspicious renal mass. No hydronephrosis. No renal or ureteric  calculi. Unremarkable urinary bladder.    Stomach/Bowel: No disproportionate dilation of the small or large  bowel loops. No evidence of abnormal bowel wall thickening or  inflammatory changes. The appendix is unremarkable.    Vascular/Lymphatic: No ascites or pneumoperitoneum. No abdominal or  pelvic lymphadenopathy, by size criteria. No aneurysmal dilation of  the major abdominal arteries.    Reproductive: Normal size prostate. Symmetric seminal vesicles.    Other: There is asymmetric fullness in the perianal region/perineum.  There are subtle ill-defined hypoattenuating areas in this region  however, no discrete walled-off abscess seen. There is moderate  surrounding fat stranding. Findings represent phlegmonous  changes/developing abscess. The soft tissues and abdominal wall are  otherwise unremarkable.    Musculoskeletal: No suspicious osseous lesions. There are mild  multilevel degenerative changes in the visualized spine. Bilateral  L5 spondylolysis noted without spondylolisthesis.    IMPRESSION:  1. There is asymmetric fullness in the perianal region/perineum with  subtle ill-defined hypoattenuating areas and moderate surrounding  fat stranding. Findings represent phlegmonous changes/developing  abscess. No discrete walled-off drainable abscess seen at this time.  2. Multiple other nonacute  observations, as described above.    Given the patient's swelling on his scalp, considered scalp galeal abscess, CT imaging ordered to further evaluate.  Signout pending results of CT imaging, plan for likely admission for continued antibiosis.  Signout given to Dr. Aldean Amass at 1700.    Final Clinical Impression(s) / ED Diagnoses Final diagnoses:  Perirectal cellulitis  Sepsis, due to unspecified organism, unspecified whether acute organ dysfunction present (HCC)  Superficial swelling of scalp    Rx / DC Orders ED Discharge Orders     None         Rosealee Concha, MD 01/31/24 2025

## 2024-01-31 NOTE — ED Provider Notes (Signed)
 Care transferred to me.  CT head is unremarkable.  On my exam he has a very small area without fluctuance to his scalp, I don't think this is causing his fever/sepsis.  It does not appear that he has a drainable fluid collection on the CT of the pelvis.  Discussed with Dr. Segars, who will admit.   Jerilynn Montenegro, MD 01/31/24 2325

## 2024-01-31 NOTE — H&P (Addendum)
 History and Physical    Pedro Owens ZOX:[id number] DOB: [birth year] DOA: 01/31/2024  PCP: Pcp, No   Patient coming from: Home   Chief Complaint:  Chief Complaint  Patient presents with   Abscess   Headache    HPI:  Pedro Owens is a 33 y.o. male with hx of HIV, intermittent nonadherence with HAART, last CD4 834, untreated hepatitis C, prior syphilis/neurosyphilis s/p treatment, polysubstance use, septic PE not on AC, hypertension, bipolar disorder, PNES, who presented due to perirectal pain.  Reports few days of perirectal pain on the R side. Was itching this area, noted becoming more swollen and indurated. Otherwise has painful LN in his groin. Denies any anal receptive intercourse. No penile lesions. Denies ongoing substance use. Also reporting small scalp abscess L posterior scalp. Notes has not taken Biktarvy in 2 weeks.    Review of Systems:  ROS complete and negative except as marked above   Allergies  Allergen Reactions   Acetaminophen Diarrhea, Nausea And Vomiting and Other (See Comments)    Flares up crohns disease    Vicodin [Hydrocodone-Acetaminophen] Nausea And Vomiting   Dilaudid [Hydromorphone Hcl] Hives and Rash    Prior to Admission medications   Medication Sig Start Date End Date Taking? Authorizing Provider  amoxicillin (AMOXIL) 500 MG capsule Take 1 capsule (500 mg total) by mouth 2 (two) times daily. 01/25/24   Peter Garter, PA  bictegravir-emtricitabine-tenofovir AF (BIKTARVY) 50-200-25 MG TABS tablet Take 1 tablet by mouth daily. 09/23/23   Gardiner Barefoot, MD  ibuprofen (ADVIL) 600 MG tablet Take 1 tablet (600 mg total) by mouth every 6 (six) hours as needed. 01/25/24   Peter Garter, PA  nicotine (NICODERM CQ - DOSED IN MG/24 HOURS) 21 mg/24hr patch Place 1 patch (21 mg total) onto the skin daily as needed (Nicotine withdrawal symptoms.). 09/25/23   Burnadette Pop, MD  ondansetron (ZOFRAN) 4 MG tablet Take 1 tablet (4 mg total) by mouth every 8  (eight) hours as needed for nausea or vomiting. 05/06/23   Judyann Munson, MD  oxyCODONE (OXY IR/ROXICODONE) 5 MG immediate release tablet Take 1-2 tablets (5-10 mg total) by mouth every 4 (four) hours as needed for moderate pain (pain score 4-6) or severe pain (pain score 7-10). 09/25/23   Burnadette Pop, MD  valACYclovir (VALTREX) 500 MG tablet Take 1 tablet (500 mg total) by mouth daily. Patient taking differently: Take 500 mg by mouth daily as needed (flare ups). 03/03/23 02/26/24  Vu, Gershon Mussel T, MD  divalproex (DEPAKOTE) 500 MG DR tablet Take 1 tablet (500 mg total) by mouth 2 (two) times daily. 02/28/16 02/28/16  Lindalou Hose, MD    Past Medical History:  Diagnosis Date   ADHD (attention deficit hyperactivity disorder)    Anal warts    Anxiety    Asthma    Bipolar disorder (HCC)    Chronic bronchitis (HCC)    Chronic pain syndrome    Convulsions (HCC) 08/14/2015   Depression    Gonorrhea 09/08/11   Headache    "weekly" (11/06/2016)   Hemorrhoids, internal, with bleeding 08/25/2011   Hepatitis C    Herpes    HIV infection (HCC)    Hypertension    ILEITIS 09/24/2010   Qualifier: Diagnosis of  By: Candice Camp CMA (AAMA), Dottie     Lymphoid hyperplasia, reactive    Marijuana abuse    Migraine    "monthly" (11/06/2016)   Pseudoseizures    Seizures (HCC)    "don't know  why I have them" (11/06/2016)   Syphilis     Past Surgical History:  Procedure Laterality Date   ANKLE SURGERY Bilateral 2005   Screw & Pins   ANKLE SURGERY Right 2008   "replaced pins & screws"   FOOT SURGERY Bilateral 2005   "bone spurs"   I & D EXTREMITY Right 06/26/2016   Procedure: IRRIGATION AND DEBRIDEMENT RIGHT FOREARM;  Surgeon: Brunilda Capra, MD;  Location: [facility] OR;  Service: Orthopedics;  Laterality: Right;   I & D EXTREMITY Right 11/04/2016   Procedure: IRRIGATION AND DEBRIDEMENT EXTREMITY;  Surgeon: Florida Hurter, MD;  Location: [facility] OR;  Service: Orthopedics;  Laterality: Right;   I & D EXTREMITY  Right 11/06/2016   Procedure: IRRIGATION AND DEBRIDEMENT right forarm;  Surgeon: Florida Hurter, MD;  Location: [facility] OR;  Service: Orthopedics;  Laterality: Right;   TEE WITHOUT CARDIOVERSION N/A 11/19/2017   Procedure: TRANSESOPHAGEAL ECHOCARDIOGRAM (TEE);  Surgeon: Elmyra Haggard, MD;  Location: [facility] ENDOSCOPY;  Service: Cardiovascular;  Laterality: N/A;   VIDEO ASSISTED THORACOSCOPY (VATS)/EMPYEMA Left 11/26/2017   Procedure: VIDEO ASSISTED THORACOSCOPY (VATS)/DRAIN EMPYEMA;  Surgeon: Heriberto London, MD;  Location[facility];  Service: Thoracic;  Laterality: Left;     reports that he has been smoking cigarettes. He has a 16.5 pack-year smoking history. He has never used smokeless tobacco. He reports current alcohol use. He reports that he does not currently use drugs after having used the following drugs: Cocaine, Benzodiazepines, Oxycodone, Marijuana, and Heroin. Frequency: 2.00 times per week.  Family History  Problem Relation Age of Onset   Diabetes Mother    Irritable bowel syndrome Mother    Hypertension Mother    Hyperlipidemia Mother    Hypothyroidism Mother    Diabetes Maternal Uncle    Heart disease Maternal Grandmother    Leukemia Other        maternal greatgrandmother   Colon cancer Neg Hx      Physical Exam: Vitals:   01/31/24 1859 01/31/24 1958 01/31/24 2053 01/31/24 2100  BP: 115/73  117/73 (!) 129/96  Pulse: 91  92 (!) 103  Resp: 17  18   Temp:  (!) 100.4 F (38 C)    TempSrc:  Oral    SpO2: 98%  96% 99%  Weight:      Height:        Gen: Awake, alert, NAD   CV: Regular, normal S1, S2, no murmurs  Resp: Normal WOB, CTAB  Abd: Flat, normoactive, nontender. Reexamination of his perineum deferred as recently examined by ED provider.  MSK: Symmetric, no edema  Skin: See EDP note re: perineal / perirectal exam. Note also of scarred cutting marks on his arms.  Neuro: Alert and interactive  Psych: euthymic, appropriate  Heme lymph: mild tender enlarged inguinal LAD     Data review:   Labs reviewed, notable for:   Lactate 1.6 T. bili 2.1, AST 76, ALT 76 WBC 24, neutrophilic with left shift  Micro:  Results for orders placed or performed during the hospital encounter of 01/31/24  Blood culture (routine x 2)     Status: None (Preliminary result)   Collection Time: 01/31/24 11:12 AM   Specimen: BLOOD LEFT ARM  Result Value Ref Range Status   Specimen Description   Final    BLOOD LEFT ARM Performed at [facility], [address] [place], [place] [address]    Special Requests   Final    BOTTLES DRAWN AEROBIC AND ANAEROBIC Blood Culture  adequate volume Performed at [facility], [address], [place], [place] [address]    Culture PENDING  Incomplete   Report Status PENDING  Incomplete  Blood culture (routine x 2)     Status: None (Preliminary result)   Collection Time: 01/31/24 11:12 AM   Specimen: BLOOD LEFT ARM  Result Value Ref Range Status   Specimen Description   Final    BLOOD LEFT ARM Performed at [facility], [address], [place], [place] [address]    Special Requests   Final    BOTTLES DRAWN AEROBIC AND ANAEROBIC Blood Culture results may not be optimal due to an inadequate volume of blood received in culture bottles Performed at [facility], [address] [place], [place] [address]    Culture PENDING  Incomplete   Report Status PENDING  Incomplete    Imaging reviewed:  CT Head W or Wo Contrast Result Date: 01/31/2024 CLINICAL DATA:  scalp abscess EXAM: CT HEAD WITHOUT CONTRAST TECHNIQUE: Contiguous axial images were obtained from the base of the skull through the vertex without intravenous contrast. RADIATION DOSE REDUCTION: This exam was performed according to the departmental dose-optimization program which includes automated exposure control, adjustment of the mA and/or kV according to patient size and/or use of iterative reconstruction technique. COMPARISON:   03/27/2023. FINDINGS: Brain: No evidence of acute infarction, hemorrhage, hydrocephalus, extra-axial collection or mass lesion/mass effect. Vascular: No hyperdense vessel or unexpected calcification. Skull: Normal. Negative for fracture or focal lesion. Sinuses/Orbits: No acute finding. IMPRESSION: No acute intracranial process. Electronically Signed   By: Sydell Eva M.D.   On: 01/31/2024 20:25   CT ABDOMEN PELVIS W CONTRAST Result Date: 01/31/2024 CLINICAL DATA:  C/f perirectal abscess. EXAM: CT ABDOMEN AND PELVIS WITH CONTRAST TECHNIQUE: Multidetector CT imaging of the abdomen and pelvis was performed using the standard protocol following bolus administration of intravenous contrast. RADIATION DOSE REDUCTION: This exam was performed according to the departmental dose-optimization program which includes automated exposure control, adjustment of the mA and/or kV according to patient size and/or use of iterative reconstruction technique. CONTRAST:  100mL OMNIPAQUE IOHEXOL 300 MG/ML  SOLN COMPARISON:  CT scan abdomen and pelvis from 10/31/2018. FINDINGS: Lower chest: There are patchy atelectatic changes in the visualized lung bases. No overt consolidation. No pleural effusion. The heart is normal in size. No pericardial effusion. Hepatobiliary: The liver is normal in size. Non-cirrhotic configuration. No suspicious mass. These is mild diffuse hepatic steatosis. No intrahepatic or extrahepatic bile duct dilation. No calcified gallstones. Normal gallbladder wall thickness. No pericholecystic inflammatory changes. Pancreas: Unremarkable. No pancreatic ductal dilatation or surrounding inflammatory changes. Spleen: Within normal limits. No focal lesion. Adrenals/Urinary Tract: Adrenal glands are unremarkable. No suspicious renal mass. No hydronephrosis. No renal or ureteric calculi. Unremarkable urinary bladder. Stomach/Bowel: No disproportionate dilation of the small or large bowel loops. No evidence of abnormal  bowel wall thickening or inflammatory changes. The appendix is unremarkable. Vascular/Lymphatic: No ascites or pneumoperitoneum. No abdominal or pelvic lymphadenopathy, by size criteria. No aneurysmal dilation of the major abdominal arteries. Reproductive: Normal size prostate. Symmetric seminal vesicles. Other: There is asymmetric fullness in the perianal region/perineum. There are subtle ill-defined hypoattenuating areas in this region however, no discrete walled-off abscess seen. There is moderate surrounding fat stranding. Findings represent phlegmonous changes/developing abscess. The soft tissues and abdominal wall are otherwise unremarkable. Musculoskeletal: No suspicious osseous lesions. There are mild multilevel degenerative changes in the visualized spine. 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 IMPRESSION:  1. There is asymmetric fullness in the perianal region/perineum with subtle ill-defined hypoattenuating areas and moderate surrounding fat stranding. Findings represent phlegmonous changes/developing abscess. No discrete walled-off drainable abscess seen at this time. 2. Multiple other nonacute observations, as described above. Electronically Signed   By: Beula Brunswick M.D.   On: 01/31/2024 15:14    ED Course:  Treated with vancomycin, Zosyn, 1 L IV fluid and started on 150 cc an hour.  Otherwise treated with morphine, fentanyl for pain.   Assessment/Plan:  33 y.o. male with hx HIV, intermittent nonadherence with HAART, last CD4 834, untreated hepatitis C, prior syphilis/neurosyphilis s/p treatment, polysubstance use, septic PE not on AC, hypertension, bipolar disorder, PNES, who presented due to perirectal pain.  Perirectal phlegmon  Sepsis, secondary to above  Acute onset perirectal pain. On initial ED evaluation tachycardic in the 120s, Tmax 38.4.  WBC 24 neutrophilic with left shift.  Lactate 1.6.  CT abdomen pelvis demonstrating right perianal asymmetric fullness  and stranding, ill defined hypo attenuating areas without discrete abscess concerning for phlegmon.  -Routine general surgery consult in the morning, not contacted overnight. -Continue vancomycin pharmacy to dose, Zosyn 3375 mg IV every 8 hours -S/p 1 L of fluid and started on maintenance rate.  Okay to stop maintenance and continue oral hydration -Follow-up blood cultures -Check rectal GC/CT -Symptomatic management Toradol 30 mg IV every 6 hours as needed for mild/moderate, oxycodone 5 mg every 4 hours as needed for severe  Scalp furuncle  L posterior scalp with subcm area with overlying scab, no fluctuance or warmth. Suspect a small furuncle.  -Will be covered with antibiotics above -If enlarging or no spontaneous drainage can involve ENT   Recent strep pharyngitis  -Started on a course of amoxicillin on 4/8.  Will need to resume on amoxicillin if zosyn is stopped before end date (4/17)   Chronic medical problems: HIV, intermittent nonadherence with HAART: CD4 pending.  Resume home Biktarvy.  ID follow-up outpatient Untreated hepatitis C, chronic liver injury: Follow-up hepatology outpatient for treatment. History of syphilis/neurosyphilis s/p treatment: At risk of reinoculation, check RPR. Polysubstance use: Check tox screen History of septic PE not on AC: Noted Hypertension: Not on home antihypertensives. Bipolar disorder: Not on medication  PNES: Noted   Body mass index is 25.03 kg/m.    DVT prophylaxis:  SCDs Code Status:  Full Code Diet:  Diet Orders (From admission, onward)    None      Family Communication:  None   Consults:  None   Admission status:   Inpatient, Telemetry bed  Severity of Illness: The appropriate patient status for this patient is INPATIENT. Inpatient status is judged to be reasonable and necessary in order to provide the required intensity of service to ensure the patient's safety. The patient's presenting symptoms, physical exam findings, and  initial radiographic and laboratory data in the context of their chronic comorbidities is felt to place them at high risk for further clinical deterioration. Furthermore, it is not anticipated that the patient will be medically stable for discharge from the hospital within 2 midnights of admission.   * I certify that at the point of admission it is my clinical judgment that the patient will require inpatient hospital care spanning beyond 2 midnights from the point of admission due to high intensity of service, high risk for further deterioration and high frequency of surveillance required.*   Arnulfo Larch, MD [facility]  How to contact the [facility] Attending or Consulting provider 7A -  7P or covering provider during after hours 7P -7A, for this patient.  Check the care team in [facility] and look for a) attending/consulting [facility] provider listed and b) the [facility] team listed Log into [email or web address] and use [facility]'s universal password to access. If you do not have the password, please contact the hospital operator. Locate the [facility] provider you are looking for under [facility] and page to a number that you can be directly reached. If you still have difficulty reaching the provider, please page the [facility] (Director on Call) for the Hospitalists listed on amion for assistance.  01/31/2024, 9:38 PM

## 2024-02-01 DIAGNOSIS — K611 Rectal abscess: Secondary | ICD-10-CM | POA: Diagnosis not present

## 2024-02-01 DIAGNOSIS — L02821 Furuncle of head [any part, except face]: Secondary | ICD-10-CM | POA: Diagnosis not present

## 2024-02-01 DIAGNOSIS — R7881 Bacteremia: Secondary | ICD-10-CM

## 2024-02-01 DIAGNOSIS — Z21 Asymptomatic human immunodeficiency virus [HIV] infection status: Secondary | ICD-10-CM | POA: Diagnosis not present

## 2024-02-01 DIAGNOSIS — B950 Streptococcus, group A, as the cause of diseases classified elsewhere: Secondary | ICD-10-CM | POA: Insufficient documentation

## 2024-02-01 LAB — BLOOD CULTURE ID PANEL (REFLEXED) - BCID2
A.calcoaceticus-baumannii: NOT DETECTED
Bacteroides fragilis: NOT DETECTED
Candida albicans: NOT DETECTED
Candida auris: NOT DETECTED
Candida glabrata: NOT DETECTED
Candida krusei: NOT DETECTED
Candida parapsilosis: NOT DETECTED
Candida tropicalis: NOT DETECTED
Cryptococcus neoformans/gattii: NOT DETECTED
Enterobacter cloacae complex: NOT DETECTED
Enterobacterales: NOT DETECTED
Enterococcus Faecium: NOT DETECTED
Enterococcus faecalis: NOT DETECTED
Escherichia coli: NOT DETECTED
Haemophilus influenzae: NOT DETECTED
Klebsiella aerogenes: NOT DETECTED
Klebsiella oxytoca: NOT DETECTED
Klebsiella pneumoniae: NOT DETECTED
Listeria monocytogenes: NOT DETECTED
Methicillin resistance mecA/C: NOT DETECTED
Neisseria meningitidis: NOT DETECTED
Proteus species: NOT DETECTED
Pseudomonas aeruginosa: NOT DETECTED
Salmonella species: NOT DETECTED
Serratia marcescens: NOT DETECTED
Staphylococcus aureus (BCID): NOT DETECTED
Staphylococcus epidermidis: DETECTED — AB
Staphylococcus lugdunensis: NOT DETECTED
Staphylococcus species: DETECTED — AB
Stenotrophomonas maltophilia: NOT DETECTED
Streptococcus agalactiae: NOT DETECTED
Streptococcus pneumoniae: NOT DETECTED
Streptococcus pyogenes: NOT DETECTED
Streptococcus species: DETECTED — AB

## 2024-02-01 LAB — CBC
HCT: 47.1 % (ref 39.0–52.0)
Hemoglobin: 15.7 g/dL (ref 13.0–17.0)
MCH: 29.8 pg (ref 26.0–34.0)
MCHC: 33.3 g/dL (ref 30.0–36.0)
MCV: 89.5 fL (ref 80.0–100.0)
Platelets: 280 10*3/uL (ref 150–400)
RBC: 5.26 MIL/uL (ref 4.22–5.81)
RDW: 13 % (ref 11.5–15.5)
WBC: 16.8 10*3/uL — ABNORMAL HIGH (ref 4.0–10.5)
nRBC: 0 % (ref 0.0–0.2)

## 2024-02-01 LAB — PHOSPHORUS: Phosphorus: 2.7 mg/dL (ref 2.5–4.6)

## 2024-02-01 LAB — BASIC METABOLIC PANEL WITH GFR
Anion gap: 9 (ref 5–15)
BUN: 21 mg/dL — ABNORMAL HIGH (ref 6–20)
CO2: 21 mmol/L — ABNORMAL LOW (ref 22–32)
Calcium: 8.3 mg/dL — ABNORMAL LOW (ref 8.9–10.3)
Chloride: 103 mmol/L (ref 98–111)
Creatinine, Ser: 0.97 mg/dL (ref 0.61–1.24)
GFR, Estimated: >60 mL/min (ref >60)
Glucose, Bld: 101 mg/dL — ABNORMAL HIGH (ref 70–99)
Potassium: 4.1 mmol/L (ref 3.5–5.1)
Sodium: 133 mmol/L — ABNORMAL LOW (ref 135–145)

## 2024-02-01 LAB — T-HELPER CELLS (CD4) COUNT (NOT AT ARMC)
CD4 % Helper T Cell: 19 % — ABNORMAL LOW (ref 33–65)
CD4 T Cell Abs: 346 /uL — ABNORMAL LOW (ref 400–1790)

## 2024-02-01 LAB — GC/CHLAMYDIA PROBE AMP (~~LOC~~) NOT AT ARMC
Chlamydia: NEGATIVE
Comment: NEGATIVE
Comment: NORMAL
Neisseria Gonorrhea: NEGATIVE

## 2024-02-01 LAB — MAGNESIUM: Magnesium: 2 mg/dL (ref 1.7–2.4)

## 2024-02-01 LAB — RPR
RPR Ser Ql: REACTIVE — AB
RPR Titer: 1:8 {titer}

## 2024-02-01 MED ORDER — VANCOMYCIN HCL 1500 MG/300ML IV SOLN
1500.0000 mg | Freq: Two times a day (BID) | INTRAVENOUS | Status: DC
  Administered 2024-02-01 – 2024-02-03 (×6): 1500 mg via INTRAVENOUS
  Filled 2024-02-01 (×7): qty 300

## 2024-02-01 NOTE — Assessment & Plan Note (Signed)
-   Left scalp noted with very small vertical with overlying scab; still no fluctuance or warmth -Continue antibiotics as noted -CT head unremarkable

## 2024-02-01 NOTE — Plan of Care (Signed)
  Problem: Education: Goal: Knowledge of General Education information will improve Description: Including pain rating scale, medication(s)/side effects and non-pharmacologic comfort measures Outcome: Progressing   Problem: Clinical Measurements: Goal: Will remain free from infection Outcome: Progressing Goal: Diagnostic test results will improve Outcome: Progressing   Problem: Safety: Goal: Ability to remain free from injury will improve Outcome: Progressing

## 2024-02-01 NOTE — Assessment & Plan Note (Signed)
-   Blood cultures on admission positive for 1/4 bottles with staph and strep species; still awaiting further speciation.  Suspect this is probably contaminant given polymicrobial and only 1 bottle but will need to await further growth -Continue antibiotics as noted above

## 2024-02-01 NOTE — Assessment & Plan Note (Signed)
-   Right perianal region noted with approximately 4 cm area of induration and very small boil with no drainage; there is minimal tenderness and no calor or erythema appreciated -Continue antibiotics for now and if clinically worsens, could repeat CT to evaluate for development of abscess at that time -No indication for consulting surgery as of yet as no indication for I&D -WBC has improved since admission, continue trending

## 2024-02-01 NOTE — Assessment & Plan Note (Signed)
-   Recent positive group A strep test on 01/25/2024 -Has been on 10-day course of amoxicillin outpatient; currently covered while on Zosyn

## 2024-02-01 NOTE — Progress Notes (Signed)
   02/01/24 1510  TOC Brief Assessment  Insurance and Status Reviewed  Patient has primary care physician No (Has Medicaid assigned PCP)  Home environment has been reviewed Single family home  Prior level of function: Independent  Prior/Current Home Services No current home services  Social Drivers of Health Review SDOH reviewed no interventions necessary  Readmission risk has been reviewed Yes  Transition of care needs transition of care needs identified, TOC will continue to follow

## 2024-02-01 NOTE — Progress Notes (Signed)
 PHARMACY NOTE:  ANTIMICROBIAL RENAL DOSAGE ADJUSTMENT  Current antimicrobial regimen includes a mismatch between antimicrobial dosage and estimated renal function.  As per policy approved by the Pharmacy & Therapeutics and Medical Executive Committees, the antimicrobial dosage will be adjusted accordingly.  Current antimicrobial dosage: vancomycin 1750mg  IV q12h  Indication: perirectal phlegmon  Renal Function:  Estimated Creatinine Clearance: 119.4 mL/min (by C-G formula based on SCr of 0.97 mg/dL). []      On intermittent HD, scheduled: []      On CRRT    Antimicrobial dosage has been changed to: vancomycin 1500mg  IV q12h (eAUC 494 using Scr 0.97, IBW, Vd 0.72)  Additional comments: N/A   Thank you for allowing pharmacy to be a part of this patient's care.  Roselee Cong, PharmD Clinical Pharmacist  4/15/20257:44 AM

## 2024-02-01 NOTE — Hospital Course (Signed)
 Mr. Wenke is a 33 year old male with PMH recurrent cellulitis HIV, HCV, syphilis s/p treatment, septic PE not on AC, HTN, bipolar d/co, PNES who presented with rectal pain.  He reports that he developed pruritus and began scratching around his rectum.  After a few days he began developing perirectal pain and swelling on the right buttock.  He denies any drainage from the area nor any fevers at home. In in, he had also complained of developing a lesion on his left scalp that had very mildly blood at some point as well. There was concern for nonadherence to his Biktarvy however states he is usually compliant but missed approximately 2 weeks of treatment recently.  CT abdomen/pelvis was obtained which showed asymmetric fullness in the perianal region/perineum with ill-defined hypoattenuating area and moderate surrounding fat stranding at the area of concern.  There were phlegmonous changes but no obvious abscess as of yet. He was started on vancomycin and Zosyn.

## 2024-02-01 NOTE — Plan of Care (Signed)
  Problem: Education: Goal: Knowledge of General Education information will improve Description: Including pain rating scale, medication(s)/side effects and non-pharmacologic comfort measures Outcome: Progressing   Problem: Health Behavior/Discharge Planning: Goal: Ability to manage health-related needs will improve Outcome: Progressing   Problem: Clinical Measurements: Goal: Will remain free from infection Outcome: Progressing   Problem: Nutrition: Goal: Adequate nutrition will be maintained Outcome: Progressing   Problem: Coping: Goal: Level of anxiety will decrease Outcome: Progressing   Problem: Elimination: Goal: Will not experience complications related to urinary retention Outcome: Progressing   Problem: Pain Managment: Goal: General experience of comfort will improve and/or be controlled Outcome: Progressing   Problem: Safety: Goal: Ability to remain free from injury will improve Outcome: Progressing   Problem: Skin Integrity: Goal: Risk for impaired skin integrity will decrease Outcome: Progressing

## 2024-02-01 NOTE — Assessment & Plan Note (Addendum)
-   fever, tachycardia, leukocytosis; perirectal area concerning for cellulitis vs early abscess - Continue antibiotics as noted above

## 2024-02-01 NOTE — Progress Notes (Signed)
 Progress Note    Pedro Owens   ZOX:[id number]  DOB: 03-Jul-1991  DOA: 01/31/2024     1 PCP: Pcp, No  Initial CC: Rectal pain  Hospital Course: Pedro Owens is a 33 year old male with PMH recurrent cellulitis HIV, HCV, syphilis s/p treatment, septic PE not on AC, HTN, bipolar d/co, PNES who presented with rectal pain.  He reports that he developed pruritus and began scratching around his rectum.  After a few days he began developing perirectal pain and swelling on the right buttock.  He denies any drainage from the area nor any fevers at home. In in, he had also complained of developing a lesion on his left scalp that had very mildly blood at some point as well. There was concern for nonadherence to his Biktarvy however states he is usually compliant but missed approximately 2 weeks of treatment recently.  CT abdomen/pelvis was obtained which showed asymmetric fullness in the perianal region/perineum with ill-defined hypoattenuating area and moderate surrounding fat stranding at the area of concern.  There were phlegmonous changes but no obvious abscess as of yet. He was started on vancomycin and Zosyn.  Interval History:  Resting in bed comfortably when seen this morning.  Did have a little bit of a headache and was given medicine by nurse. Understands plan for trial of IV antibiotics and monitoring rectal pain.  If worsens, may need repeat CT in a couple days.  Assessment and Plan: * Perirectal cellulitis - Right perianal region noted with approximately 4 cm area of induration and very small boil with no drainage; there is minimal tenderness and no calor or erythema appreciated -Continue antibiotics for now and if clinically worsens, could repeat CT to evaluate for development of abscess at that time -No indication for consulting surgery as of yet as no indication for I&D -WBC has improved since admission, continue trending  Positive blood culture - Blood cultures on admission  positive for 1/4 bottles with staph and strep species; still awaiting further speciation.  Suspect this is probably contaminant given polymicrobial and only 1 bottle but will need to await further growth -Continue antibiotics as noted above  Sepsis (HCC) - fever, tachycardia, leukocytosis; perirectal area concerning for cellulitis vs early abscess - Continue antibiotics as noted above  Furuncle of scalp - Left scalp noted with very small vertical with overlying scab; still no fluctuance or warmth -Continue antibiotics as noted -CT head unremarkable  HIV (human immunodeficiency virus infection) (HCC) - Mostly compliant but endorses missing approximately 2 weeks of treatment recently -Continue Biktarvy - Last CD4 834 on 11/17/2023  Group A streptococcal infection - Recent positive group A strep test on 01/25/2024 -Has been on 10-day course of amoxicillin outpatient; currently covered while on Zosyn   Old records reviewed in assessment of this patient  Antimicrobials: Zosyn 01/31/2024 >> current Vancomycin 01/31/2024 >> current  DVT prophylaxis:  SCDs Start: 01/31/24 2152   Code Status:   Code Status: Full Code  Mobility Assessment (Last 72 Hours)     Mobility Assessment     Row Name 01/31/24 2300           Does patient have an order for bedrest or is patient medically unstable No - Continue assessment       What is the highest level of mobility based on the progressive mobility assessment? Level 6 (Walks independently in room and hall) - Balance while walking in room without assist - Complete  Barriers to discharge: None Disposition Plan: Home HH orders placed: N/A Status is: Inpatient  Objective: Blood pressure (!) 136/95, pulse 94, temperature 98.5 F (36.9 C), resp. rate 18, height 5' 11.5" (1.816 m), weight 82.6 kg, SpO2 99%.  Examination:  Physical Exam Constitutional:      General: He is not in acute distress.    Appearance: Normal  appearance.  HENT:     Head: Normocephalic and atraumatic.     Mouth/Throat:     Mouth: Mucous membranes are moist.  Eyes:     Extraocular Movements: Extraocular movements intact.  Cardiovascular:     Rate and Rhythm: Normal rate and regular rhythm.  Pulmonary:     Effort: Pulmonary effort is normal. No respiratory distress.     Breath sounds: Normal breath sounds. No wheezing.  Abdominal:     General: Bowel sounds are normal. There is no distension.     Palpations: Abdomen is soft.     Tenderness: There is no abdominal tenderness.  Genitourinary:    Comments: Right perianal region noted with approximately 4 cm area of induration and small boil towards rectum; no drainage appreciated nor any erythema or calor Musculoskeletal:        General: Normal range of motion.     Cervical back: Normal range of motion and neck supple.  Skin:    General: Skin is warm and dry.  Neurological:     General: No focal deficit present.     Mental Status: He is alert.  Psychiatric:        Mood and Affect: Mood normal.        Behavior: Behavior normal.      Consultants:    Procedures:    Data Reviewed: Results for orders placed or performed during the hospital encounter of 01/31/24 (from the past 24 hours)  Basic metabolic panel with GFR     Status: Abnormal   Collection Time: 02/01/24  5:22 AM  Result Value Ref Range   Sodium 133 (L) 135 - 145 mmol/L   Potassium 4.1 3.5 - 5.1 mmol/L   Chloride 103 98 - 111 mmol/L   CO2 21 (L) 22 - 32 mmol/L   Glucose, Bld 101 (H) 70 - 99 mg/dL   BUN 21 (H) 6 - 20 mg/dL   Creatinine, Ser 9.62 0.61 - 1.24 mg/dL   Calcium 8.3 (L) 8.9 - 10.3 mg/dL   GFR, Estimated >95 >28 mL/min   Anion gap 9 5 - 15  CBC     Status: Abnormal   Collection Time: 02/01/24  5:22 AM  Result Value Ref Range   WBC 16.8 (H) 4.0 - 10.5 K/uL   RBC 5.26 4.22 - 5.81 MIL/uL   Hemoglobin 15.7 13.0 - 17.0 g/dL   HCT 41.3 24.4 - 01.0 %   MCV 89.5 80.0 - 100.0 fL   MCH 29.8 26.0 -  34.0 pg   MCHC 33.3 30.0 - 36.0 g/dL   RDW 27.2 53.6 - 64.4 %   Platelets 280 150 - 400 K/uL   nRBC 0.0 0.0 - 0.2 %  Magnesium     Status: None   Collection Time: 02/01/24  5:22 AM  Result Value Ref Range   Magnesium 2.0 1.7 - 2.4 mg/dL  Phosphorus     Status: None   Collection Time: 02/01/24  5:22 AM  Result Value Ref Range   Phosphorus 2.7 2.5 - 4.6 mg/dL    I have reviewed pertinent nursing notes, vitals, labs, and images  as necessary. I have ordered labwork to follow up on as indicated.  I have reviewed the last notes from staff over past 24 hours. I have discussed patient's care plan and test results with nursing staff, CM/SW, and other staff as appropriate.  Time spent: Greater than 50% of the 55 minute visit was spent in counseling/coordination of care for the patient as laid out in the A&P.   LOS: 1 day   Faith Homes, MD [facility] 02/01/2024, 11:36 AM

## 2024-02-01 NOTE — Progress Notes (Signed)
 PHARMACY - PHYSICIAN COMMUNICATION CRITICAL VALUE ALERT - BLOOD CULTURE IDENTIFICATION (BCID)  Pedro Owens is an 33 y.o. male who presented to [facility] on 01/31/2024 with a chief complaint of perirectal pain.  Assessment: BCID in 1/4 bottles, anaerobic: Staph epi, Strep sp - no resistance noted   Name of physician (or Provider) Contacted: Dr. Gladstone Lamer  Current antibiotics: vancomycin and Zosyn  Changes to prescribed antibiotics recommended:  Could be a contaminant - would continue current antibiotics while awaiting results of second blood culture set.  Results for orders placed or performed during the hospital encounter of 01/31/24  Blood Culture ID Panel (Reflexed) (Collected: 01/31/2024 11:12 AM)  Result Value Ref Range   Enterococcus faecalis NOT DETECTED NOT DETECTED   Enterococcus Faecium NOT DETECTED NOT DETECTED   Listeria monocytogenes NOT DETECTED NOT DETECTED   Staphylococcus species DETECTED (A) NOT DETECTED   Staphylococcus aureus (BCID) NOT DETECTED NOT DETECTED   Staphylococcus epidermidis DETECTED (A) NOT DETECTED   Staphylococcus lugdunensis NOT DETECTED NOT DETECTED   Streptococcus species DETECTED (A) NOT DETECTED   Streptococcus agalactiae NOT DETECTED NOT DETECTED   Streptococcus pneumoniae NOT DETECTED NOT DETECTED   Streptococcus pyogenes NOT DETECTED NOT DETECTED   A.calcoaceticus-baumannii NOT DETECTED NOT DETECTED   Bacteroides fragilis NOT DETECTED NOT DETECTED   Enterobacterales NOT DETECTED NOT DETECTED   Enterobacter cloacae complex NOT DETECTED NOT DETECTED   Escherichia coli NOT DETECTED NOT DETECTED   Klebsiella aerogenes NOT DETECTED NOT DETECTED   Klebsiella oxytoca NOT DETECTED NOT DETECTED   Klebsiella pneumoniae NOT DETECTED NOT DETECTED   Proteus species NOT DETECTED NOT DETECTED   Salmonella species NOT DETECTED NOT DETECTED   Serratia marcescens NOT DETECTED NOT DETECTED   Haemophilus influenzae NOT DETECTED NOT DETECTED   Neisseria  meningitidis NOT DETECTED NOT DETECTED   Pseudomonas aeruginosa NOT DETECTED NOT DETECTED   Stenotrophomonas maltophilia NOT DETECTED NOT DETECTED   Candida albicans NOT DETECTED NOT DETECTED   Candida auris NOT DETECTED NOT DETECTED   Candida glabrata NOT DETECTED NOT DETECTED   Candida krusei NOT DETECTED NOT DETECTED   Candida parapsilosis NOT DETECTED NOT DETECTED   Candida tropicalis NOT DETECTED NOT DETECTED   Cryptococcus neoformans/gattii NOT DETECTED NOT DETECTED   Methicillin resistance mecA/C NOT DETECTED NOT DETECTED     Roselee Cong, PharmD Clinical Pharmacist  4/15/20257:25 AM

## 2024-02-01 NOTE — Assessment & Plan Note (Signed)
-   Mostly compliant but endorses missing approximately 2 weeks of treatment recently -Continue Biktarvy - Last CD4 834 on 11/17/2023

## 2024-02-02 DIAGNOSIS — K611 Rectal abscess: Secondary | ICD-10-CM | POA: Diagnosis not present

## 2024-02-02 LAB — T.PALLIDUM AB, TOTAL: T Pallidum Abs: REACTIVE — AB

## 2024-02-02 MED ORDER — OXYCODONE HCL 5 MG PO TABS
10.0000 mg | ORAL_TABLET | Freq: Four times a day (QID) | ORAL | Status: DC | PRN
  Administered 2024-02-02 – 2024-02-06 (×11): 10 mg via ORAL
  Filled 2024-02-02 (×14): qty 2

## 2024-02-02 NOTE — Consult Note (Incomplete)
 [facility]                                                                                        Patient Identification: Patient Name: MAHKI SPIKES MRN: [id number] Admit Date: 01/31/2024  5:06 AM Today's Date: 02/02/2024 Reason for consult: perirectal cellulitis  Requesting provider: Dr Ashley Royalty  Principal Problem:   Perirectal cellulitis Active Problems:   HIV (human immunodeficiency virus infection) (HCC)   Chronic hepatitis C without hepatic coma (HCC)   Sepsis (HCC)   Furuncle of scalp   Group A streptococcal infection   Positive blood culture   Antibiotics:  Vancomycin 4/14- Zosyn 4/14-  Lines/Hardware:  Assessment # Perirectal abscess/possible left scalp furunce - MSM, denies recent receptive anal intercourse  # Strep mitis/oralis 1/4 bottles -  could be a contaminant vs true bacteremia  # Recent Group A strep throat  - s/p treatment  # HIV - off of Biktarvy for 2/5 weeks PTA Lab Results  Component Value Date   HIV1RNAQUANT 284 (H) 11/17/2023   Lab Results  Component Value Date   CD4TABS 346 (L) 01/31/2024   CD4TABS 834 11/17/2023   CD4TABS 475 09/22/2023   # Syphilis/Neurosyphilis s/p tx - RPR down to 1: 8  # HCV, not treated   Recommendations  - continue Vancomycin and zosyn, pharmacy to dose - May need to consult surgery post repeat CT if abscess present for I and D - TTE. Addendum- fu repeat blood cultures  - continue Biktarvy, discussed about compliance  - HIV RNA, Urine/Oral and anal GC ordered  - Monitor CBC, CMP and vancomycin trough  - Universal/standard isolation precautions   Rest of the management as per the primary team. Please call with questions or concerns.  Thank you for the consult  __________________________________________________________________________________________________________ HPI and Hospital Course: 33 YO Male with  prior h/o as below including HIV, HCV, IVDU, septic PE, HTN, asthma,  neurosyphilis, Gonorrhea, Herpes, Anal warts,  conversion d/o/pseudoseizure/Bipolar d/o/Anxiety/Depression  who presented to ED 4/14 with scalp abscess and perirectal abscess. He reports scratching in his perirectal area few weeks ago and thinks he might have had an injury. He then had increasing swelling and pain in the scalp as well as near rectum to the extent he had difficulty with BM. The swelling in the perirectal area  got more swollen and indurated. However thinks the scalp abscess ha decreased in size to almost half. Denies unprotected sexual or receptive intercourse, rashes, lesions in penis. He  reports being sexually active with males only. He reports not taking Biktarvy for 2 and half weeks.   At ED febrile, tachycardic Lab remarkable for  WBC 24.5, AST 76, ALT 76 CD4 19%, 346  CT abdomen pelvis  1. There is asymmetric fullness in the perianal region/perineum with subtle ill-defined hypoattenuating areas and moderate surrounding fat stranding. Findings represent phlegmonous changes/developing abscess. No discrete walled-off drainable abscess seen at this time.  CT head with no acute abnormality  Was given Vancomycin and zosyn  He reports living with his mother PTA, he had some issues with mother but plans to go back to stay  with her. Smokes and occasional use.   ROS: General- Denies fever, chills, loss of appetite and loss of weight HEENT - Denies headache, blurry vision, neck pain, sinus pain Chest - Denies any chest pain, SOB or cough CVS- Denies any dizziness/lightheadedness, syncopal attacks, palpitations Abdomen- Denies any nausea, vomiting, abdominal pain, hematochezia and diarrhea Neuro - Denies any weakness, numbness, tingling sensation Psych - Denies any changes in mood irritability or depressive symptoms GU- Denies any burning, dysuria, hematuria or increased frequency of urination Skin - denies any  rashes/lesions MSK - denies any joint pain/swelling or restricted ROM   Past Medical History:  Diagnosis Date   ADHD (attention deficit hyperactivity disorder)    Anal warts    Anxiety    Asthma    Bipolar disorder (HCC)    Chronic bronchitis (HCC)    Chronic pain syndrome    Convulsions (HCC) 08/14/2015   Depression    Gonorrhea 09/08/11   Headache    "weekly" (11/06/2016)   Hemorrhoids, internal, with bleeding 08/25/2011   Hepatitis C    Herpes    HIV infection (HCC)    Hypertension    ILEITIS 09/24/2010   Qualifier: Diagnosis of  By: Misty Amour CMA (AAMA), Dottie     Lymphoid hyperplasia, reactive    Marijuana abuse    Migraine    "monthly" (11/06/2016)   Pseudoseizures    Seizures (HCC)    "don't know why I have them" (11/06/2016)   Syphilis    Past Surgical History:  Procedure Laterality Date   ANKLE SURGERY Bilateral 2005   Screw & Pins   ANKLE SURGERY Right 2008   "replaced pins & screws"   FOOT SURGERY Bilateral 2005   "bone spurs"   I & D EXTREMITY Right 06/26/2016   Procedure: IRRIGATION AND DEBRIDEMENT RIGHT FOREARM;  Surgeon: Brunilda Capra, MD;  Location: [facility] OR;  Service: Orthopedics;  Laterality: Right;   I & D EXTREMITY Right 11/04/2016   Procedure: IRRIGATION AND DEBRIDEMENT EXTREMITY;  Surgeon: Florida Hurter, MD;  Location: [facility] OR;  Service: Orthopedics;  Laterality: Right;   I & D EXTREMITY Right 11/06/2016   Procedure: IRRIGATION AND DEBRIDEMENT right forarm;  Surgeon: Florida Hurter, MD;  Location: [facility] OR;  Service: Orthopedics;  Laterality: Right;   TEE WITHOUT CARDIOVERSION N/A 11/19/2017   Procedure: TRANSESOPHAGEAL ECHOCARDIOGRAM (TEE);  Surgeon: Elmyra Haggard, MD;  Location: [facility] ENDOSCOPY;  Service: Cardiovascular;  Laterality: N/A;   VIDEO ASSISTED THORACOSCOPY (VATS)/EMPYEMA Left 11/26/2017   Procedure: VIDEO ASSISTED THORACOSCOPY (VATS)/DRAIN EMPYEMA;  Surgeon: Heriberto London, MD;  Location: [facility] OR;  Service: Thoracic;  Laterality: Left;      Scheduled Meds:  bictegravir-emtricitabine-tenofovir AF  1 tablet Oral Daily   sodium chloride flush  3 mL Intravenous Q12H   Continuous Infusions:  piperacillin-tazobactam 3.375 g (02/02/24 1353)   vancomycin 1,500 mg (02/02/24 1124)   PRN Meds:.ketorolac, melatonin, nicotine, ondansetron (ZOFRAN) IV, oxyCODONE, polyethylene glycol  Allergies  Allergen Reactions   Acetaminophen Diarrhea, Nausea And Vomiting and Other (See Comments)    Flares up crohns disease    Vicodin [Hydrocodone-Acetaminophen] Nausea And Vomiting   Dilaudid [Hydromorphone Hcl] Hives and Rash   Social History   Socioeconomic History   Marital status: Single    Spouse name: Not on file   Number of children: 0   Years of education: Not on file   Highest education level: Not on file  Occupational History   Occupation: Production designer, theatre/television/film  Tobacco Use   Smoking status: Every Day  Current packs/day: 1.50    Average packs/day: 1.5 packs/day for 11.0 years (16.5 ttl pk-yrs)    Types: Cigarettes    Passive exposure: Current   Smokeless tobacco: Never  Vaping Use   Vaping status: Never Used  Substance and Sexual Activity   Alcohol use: Yes    Comment: occ   Drug use: Not Currently    Frequency: 2.0 times per week    Types: Cocaine, Benzodiazepines, Oxycodone, Marijuana, Heroin    Comment: "just marijuana"    Sexual activity: Not Currently    Partners: Male    Birth control/protection: Condom    Comment: declined condoms  Other Topics Concern   Not on file  Social History Narrative   ** Merged History Encounter **       Social Drivers of Corporate investment banker Strain: Not on file  Food Insecurity: No Food Insecurity (01/31/2024)   Hunger Vital Sign    Worried About Running Out of Food in the Last Year: Never true    Ran Out of Food in the Last Year: Never true  Transportation Needs: No Transportation Needs (01/31/2024)   PRAPARE - Administrator, Civil Service (Medical): No     Lack of Transportation (Non-Medical): No  Physical Activity: Not on file  Stress: Not on file  Social Connections: Not on file  Intimate Partner Violence: Not At Risk (01/31/2024)   Humiliation, Afraid, Rape, and Kick questionnaire    Fear of Current or Ex-Partner: No    Emotionally Abused: No    Physically Abused: No    Sexually Abused: No   Family History  Problem Relation Age of Onset   Diabetes Mother    Irritable bowel syndrome Mother    Hypertension Mother    Hyperlipidemia Mother    Hypothyroidism Mother    Diabetes Maternal Uncle    Heart disease Maternal Grandmother    Leukemia Other        maternal greatgrandmother   Colon cancer Neg Hx     Vitals BP 138/80 (BP Location: Right Arm)   Pulse 67   Temp 99.1 F (37.3 C)   Resp 17   Ht 5' 11.5" (1.816 m)   Wt 82.6 kg   SpO2 100%   BMI 25.03 kg/m     Physical Exam Constitutional:  adult male lying in the bed, non toxic appearing     Comments: HEENT wnl   Cardiovascular:     Rate and Rhythm: Normal rate and regular rhythm.     Heart sounds: s1s2.   Pulmonary:     Effort: Pulmonary effort is normal.     Comments: Normal breath sounds   Abdominal:     Palpations: Abdomen is soft.     Tenderness: non tender and non distended, BS+  GU ( chaperoned) Swelling and induration in the rt perirectal area with some stained discharge in the overlying bandage, no crepitus of fluctuance. Mild warmth.   Musculoskeletal:        General: No swelling or tenderness in peripheral joints   Skin:    Comments: no rashes, approx less than 1cm *1 cm  swelling in the left scalp, no tenderness/fluctuance, no overlying erythema   Neurological:     General: awake, alert and oriented, grossly non focal   Psychiatric:        Mood and Affect: Mood normal.    Pertinent Microbiology Results for orders placed or performed during the hospital encounter of 01/31/24  Blood culture (  routine x 2)     Status: Abnormal   Collection  Time: 01/31/24 11:12 AM   Specimen: BLOOD LEFT ARM  Result Value Ref Range Status   Specimen Description   Final    BLOOD LEFT ARM Performed at [facility], [address], [place], [place] [address]    Special Requests   Final    BOTTLES DRAWN AEROBIC AND ANAEROBIC Blood Culture adequate volume Performed at [facility], [address], [place], [place] [address]    Culture  Setup Time   Final    GRAM POSITIVE COCCI IN CHAINS ANAEROBIC BOTTLE ONLY Organism ID to follow CRITICAL RESULT CALLED TO, READ BACK BY AND VERIFIED WITH: T GREEN,[id number]  02/01/24 MK Performed at [facility], [address] [place], [place] [address]    Culture STREPTOCOCCUS MITIS/ORALIS (A)  Final   Report Status 02/03/2024 FINAL  Final   Organism ID, Bacteria STREPTOCOCCUS MITIS/ORALIS  Final      Susceptibility   Streptococcus mitis/oralis - MIC*    PENICILLIN 0.25 INTERMEDIATE Intermediate     CEFTRIAXONE <=0.12 SENSITIVE Sensitive     LEVOFLOXACIN 1 SENSITIVE Sensitive     VANCOMYCIN 0.5 SENSITIVE Sensitive     * STREPTOCOCCUS MITIS/ORALIS  Blood culture (routine x 2)     Status: None (Preliminary result)   Collection Time: 01/31/24 11:12 AM   Specimen: BLOOD LEFT ARM  Result Value Ref Range Status   Specimen Description   Final    BLOOD LEFT ARM Performed at [facility], [address], [place], [place] [address]    Special Requests   Final    BOTTLES DRAWN AEROBIC AND ANAEROBIC Blood Culture results may not be optimal due to an inadequate volume of blood received in culture bottles Performed at [facility], [address] [place], [place] [address]    Culture   Final    NO GROWTH 3 DAYS Performed at [facility], [address] [place], [place] [address]    Report Status PENDING  Incomplete  Blood Culture ID Panel (Reflexed)     Status: Abnormal   Collection Time: 01/31/24 11:12 AM  Result Value Ref Range Status    Enterococcus faecalis NOT DETECTED NOT DETECTED Final   Enterococcus Faecium NOT DETECTED NOT DETECTED Final   Listeria monocytogenes NOT DETECTED NOT DETECTED Final   Staphylococcus species DETECTED (A) NOT DETECTED Final    Comment: CRITICAL RESULT CALLED TO, READ BACK BY AND VERIFIED WITH: T GREEN,PHARMD@[id number]  02/01/24 MK    Staphylococcus aureus (BCID) NOT DETECTED NOT DETECTED Final   Staphylococcus epidermidis DETECTED (A) NOT DETECTED Final    Comment: CRITICAL RESULT CALLED TO, READ BACK BY AND VERIFIED WITH: T GREEN,PHARMD@[id number]  02/01/24 MK    Staphylococcus lugdunensis NOT DETECTED NOT DETECTED Final   Streptococcus species DETECTED (A) NOT DETECTED Final    Comment: Not Enterococcus species, Streptococcus agalactiae, Streptococcus pyogenes, or Streptococcus pneumoniae. CRITICAL RESULT CALLED TO, READ BACK BY AND VERIFIED WITH: T GREEN,PHARMD@[id number]  02/01/24 MK    Streptococcus agalactiae NOT DETECTED NOT DETECTED Final   Streptococcus pneumoniae NOT DETECTED NOT DETECTED Final   Streptococcus pyogenes NOT DETECTED NOT DETECTED Final   A.calcoaceticus-baumannii NOT DETECTED NOT DETECTED Final   Bacteroides fragilis NOT DETECTED NOT DETECTED Final   Enterobacterales NOT DETECTED NOT DETECTED Final   Enterobacter cloacae complex NOT DETECTED NOT DETECTED Final   Escherichia coli NOT DETECTED NOT DETECTED Final   Klebsiella aerogenes NOT DETECTED  NOT DETECTED Final   Klebsiella oxytoca NOT DETECTED NOT DETECTED Final   Klebsiella pneumoniae NOT DETECTED NOT DETECTED Final   Proteus species NOT DETECTED NOT DETECTED Final   Salmonella species NOT DETECTED NOT DETECTED Final   Serratia marcescens NOT DETECTED NOT DETECTED Final   Haemophilus influenzae NOT DETECTED NOT DETECTED Final   Neisseria meningitidis NOT DETECTED NOT DETECTED Final   Pseudomonas aeruginosa NOT DETECTED NOT DETECTED Final   Stenotrophomonas maltophilia NOT DETECTED NOT DETECTED Final   Candida albicans NOT  DETECTED NOT DETECTED Final   Candida auris NOT DETECTED NOT DETECTED Final   Candida glabrata NOT DETECTED NOT DETECTED Final   Candida krusei NOT DETECTED NOT DETECTED Final   Candida parapsilosis NOT DETECTED NOT DETECTED Final   Candida tropicalis NOT DETECTED NOT DETECTED Final   Cryptococcus neoformans/gattii NOT DETECTED NOT DETECTED Final   Methicillin resistance mecA/C NOT DETECTED NOT DETECTED Final    Comment: Performed at [facility], [address], [place], [place] [address]  Culture, blood (Routine X 2) w Reflex to ID Panel     Status: None (Preliminary result)   Collection Time: 02/02/24  7:39 PM   Specimen: BLOOD RIGHT HAND  Result Value Ref Range Status   Specimen Description   Final    BLOOD RIGHT HAND Performed at [facility], [address], [place], [place] [address]    Special Requests   Final    BOTTLES DRAWN AEROBIC AND ANAEROBIC Blood Culture adequate volume Performed at [facility], [address], [place], [place] [address]    Culture   Final    NO GROWTH < 12 HOURS Performed at [facility], [address], [place], [place] [address]    Report Status PENDING  Incomplete  Culture, blood (Routine X 2) w Reflex to ID Panel     Status: None (Preliminary result)   Collection Time: 02/02/24  7:44 PM   Specimen: BLOOD RIGHT HAND  Result Value Ref Range Status   Specimen Description   Final    BLOOD RIGHT HAND Performed at [facility], [address] [place], [place] [address]    Special Requests   Final    BOTTLES DRAWN AEROBIC AND ANAEROBIC Blood Culture adequate volume Performed at [facility], [address], [place], [place] [address]    Culture   Final    NO GROWTH < 12 HOURS Performed at [facility], [address] [place], [place] [address]    Report Status PENDING  Incomplete    Pertinent Lab seen by me:    Latest Ref Rng & Units 02/01/2024    5:22 AM 01/31/2024    11:12 AM 09/23/2023    8:40 AM  CBC  WBC 4.0 - 10.5 K/uL 16.8  24.5  7.8   Hemoglobin 13.0 - 17.0 g/dL 53.6  64.4  03.4   Hematocrit 39.0 - 52.0 % 47.1  47.3  43.0   Platelets 150 - 400 K/uL 280  329  317       Latest Ref Rng & Units 02/01/2024    5:22 AM 01/31/2024   11:12 AM 09/23/2023    8:40 AM  CMP  Glucose 70 - 99 mg/dL 742  595  88   BUN 6 - 20 mg/dL 21  16  20    Creatinine 0.61 - 1.24 mg/dL 6.38  7.56  4.33   Sodium 135 - 145 mmol/L 133  134  137   Potassium 3.5 - 5.1 mmol/L 4.1  4.4  3.9   Chloride 98 - 111 mmol/L 103  99  101   CO2 22 - 32 mmol/L 21  23  28    Calcium 8.9 - 10.3 mg/dL 8.3  8.6  8.8   Total Protein 6.5 - 8.1 g/dL  7.9    Total Bilirubin 0.0 - 1.2 mg/dL  2.1    Alkaline Phos 38 - 126 U/L  61    AST 15 - 41 U/L  76    ALT 0 - 44 U/L  76     Pertinent Imagings/Other Imagings Plain films and CT images have been personally visualized and interpreted; radiology reports have been reviewed. Decision making incorporated into the Impression / Recommendations.  CT Head W or Wo Contrast Result Date: 01/31/2024 CLINICAL DATA:  scalp abscess EXAM: CT HEAD WITHOUT CONTRAST TECHNIQUE: Contiguous axial images were obtained from the base of the skull through the vertex without intravenous contrast. RADIATION DOSE REDUCTION: This exam was performed according to the departmental dose-optimization program which includes automated exposure control, adjustment of the mA and/or kV according to patient size and/or use of iterative reconstruction technique. COMPARISON:  03/27/2023. FINDINGS: Brain: No evidence of acute infarction, hemorrhage, hydrocephalus, extra-axial collection or mass lesion/mass effect. Vascular: No hyperdense vessel or unexpected calcification. Skull: Normal. Negative for fracture or focal lesion. Sinuses/Orbits: No acute finding. IMPRESSION: No acute intracranial process. Electronically Signed   By: Layla Maw M.D.   On: 01/31/2024 20:25   CT ABDOMEN PELVIS W  CONTRAST Result Date: 01/31/2024 CLINICAL DATA:  C/f perirectal abscess. EXAM: CT ABDOMEN AND PELVIS WITH CONTRAST TECHNIQUE: Multidetector CT imaging of the abdomen and pelvis was performed using the standard protocol following bolus administration of intravenous contrast. RADIATION DOSE REDUCTION: This exam was performed according to the departmental dose-optimization program which includes automated exposure control, adjustment of the mA and/or kV according to patient size and/or use of iterative reconstruction technique. CONTRAST:  OMNIPAQUE IOHEXOL 300 MG/ML  SOLN COMPARISON:  CT scan abdomen and pelvis from 10/31/2018. FINDINGS: Lower chest: There are patchy atelectatic changes in the visualized lung bases. No overt consolidation. No pleural effusion. The heart is normal in size. No pericardial effusion. Hepatobiliary: The liver is normal in size. Non-cirrhotic configuration. No suspicious mass. These is mild diffuse hepatic steatosis. No intrahepatic or extrahepatic bile duct dilation. No calcified gallstones. Normal gallbladder wall thickness. No pericholecystic inflammatory changes. Pancreas: Unremarkable. No pancreatic ductal dilatation or surrounding inflammatory changes. Spleen: Within normal limits. No focal lesion. Adrenals/Urinary Tract: Adrenal glands are unremarkable. No suspicious renal mass. No hydronephrosis. No renal or ureteric calculi. Unremarkable urinary bladder. Stomach/Bowel: No disproportionate dilation of the small or large bowel loops. No evidence of abnormal bowel wall thickening or inflammatory changes. The appendix is unremarkable. Vascular/Lymphatic: No ascites or pneumoperitoneum. No abdominal or pelvic lymphadenopathy, by size criteria. No aneurysmal dilation of the major abdominal arteries. Reproductive: Normal size prostate. Symmetric seminal vesicles. Other: There is asymmetric fullness in the perianal region/perineum. There are subtle ill-defined hypoattenuating areas  in this region however, no discrete walled-off abscess seen. There is moderate surrounding fat stranding. Findings represent phlegmonous changes/developing abscess. The soft tissues and abdominal wall are otherwise unremarkable. Musculoskeletal: No suspicious osseous lesions. There are mild multilevel degenerative changes in the visualized spine. 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 IMPRESSION: 1. There is asymmetric fullness in the perianal region/perineum with subtle ill-defined hypoattenuating areas and moderate surrounding fat stranding. Findings  represent phlegmonous changes/developing abscess. No discrete walled-off drainable abscess seen at this time. 2. Multiple other nonacute observations, as described above. Electronically Signed   By: Beula Brunswick M.D.   On: 01/31/2024 15:14    I have personally spent 85 minutes involved in face-to-face and non-face-to-face activities for this patient on the day of the visit. Professional time spent includes the following activities: Preparing to see the patient (review of tests), Obtaining and/or reviewing separately obtained history (admission/discharge record), Performing a medically appropriate examination and/or evaluation , Ordering medications/tests/procedures, referring and communicating with other health care professionals, Documenting clinical information in the EMR, Independently interpreting results (not separately reported), Communicating results to the patient/family/caregiver, Counseling and educating the patient/family/caregiver and Care coordination (not separately reported).  Electronically signed by:   Plan d/w requesting provider as well as ID pharm D  Of note, portions of this note may have been created with voice recognition software. While this note has been edited for accuracy, occasional wrong-word or 'sound-a-like' substitutions may have occurred due to the inherent limitations of voice recognition software.    Terre Ferri, MD Infectious Disease Physician [facility] Pager: [phone]

## 2024-02-02 NOTE — Progress Notes (Signed)
 PROGRESS NOTE    Pedro Owens  NWG:[id number] DOB: 1991-09-03 DOA: 01/31/2024 PCP: Pcp, No  Brief Narrative:33 year old male with PMH recurrent cellulitis HIV, HCV, syphilis s/p treatment, septic PE not on AC, HTN, bipolar d/co, PNES who presented with rectal pain.  He reports that he developed pruritus and began scratching around his rectum.  After a few days he began developing perirectal pain and swelling on the right buttock.  He denies any drainage from the area nor any fevers at home. In in, he had also complained of developing a lesion on his left scalp that had very mildly blood at some point as well. There was concern for nonadherence to his Biktarvy however states he is usually compliant but missed approximately 2 weeks of treatment recently.   CT abdomen/pelvis 4/14-was obtained which showed asymmetric fullness in the perianal region/perineum with ill-defined hypoattenuating area and moderate surrounding fat stranding at the area of concern.  There were phlegmonous changes but no obvious abscess as of yet. He was started on vancomycin and Zosyn.  Assessment & Plan:   Principal Problem:   Perirectal cellulitis Active Problems:   Sepsis (HCC)   Positive blood culture   HIV (human immunodeficiency virus infection) (HCC)   Furuncle of scalp   Chronic hepatitis C without hepatic coma (HCC)   Group A streptococcal infection    Perirectal cellulitis-with 4 cm area of induration and CT evidence of the same WBCs improving on vancomycin and Zosyn However he does complain he has severe pain in his rectum Consulted ID May need to consult general surgery May need to repeat CT  Chart review noted multiple GI appointments were missed supposed to have EGD colonoscopies etc. ID notes reviewed.   Positive blood culture with staph and Streptococcus mitis Continue current antibiotics  ID consulted and appreciated   sepsis (HCC) - fever, tachycardia, leukocytosis; perirectal area  concerning for cellulitis vs early abscess - Continue antibiotics as noted above   Furuncle of scalp - Left scalp noted with very small vertical with overlying scab; still no fluctuance or warmth -Continue antibiotics as noted -CT head unremarkable   HIV (human immunodeficiency virus infection) (HCC) - Mostly compliant but endorses missing approximately 2 weeks of treatment recently -Continue Biktarvy -CD4 346 down from 834 in January 2025   Group A streptococcal infection - Recent positive group A strep test on 01/25/2024 -Has been on 10-day course of amoxicillin outpatient; currently covered while on Zosyn    Estimated body mass index is 25.03 kg/m as calculated from the following:   Height as of this encounter: 5' 11.5" (1.816 m).   Weight as of this encounter: 82.6 kg.  DVT prophylaxis: scd Code Status: full Family Communication: none Disposition Plan:  Status is: Inpatient Remains inpatient appropriate because: vanco zosyn   Consultants: ID consulted  Procedures: None Antimicrobials: Vanco and Zosyn  Subjective: Complains of severe perirectal pain and afraid to have a BM  Objective: Vitals:   02/01/24 0721 02/01/24 1118 02/01/24 1908 02/02/24 0519  BP: 117/72 (!) 136/95 (!) 149/90 123/86  Pulse: 73 94 84 71  Resp:   15 18  Temp: 97.6 F (36.4 C) 98.5 F (36.9 C) 99.2 F (37.3 C) 98.3 F (36.8 C)  TempSrc: Oral  Oral   SpO2: 99% 99% 99% 98%  Weight:      Height:        Intake/Output Summary (Last 24 hours) at 02/02/2024 1346 Last data filed at 02/02/2024 1000 Gross per 24 hour  Intake 981.81 ml  Output 250 ml  Net 731.81 ml   Filed Weights   01/31/24 0516  Weight: 82.6 kg    Examination:  General exam: Appears chronically ill-appearing Respiratory system: Clear to auscultation. Respiratory effort normal. Cardiovascular system: Regular Gastrointestinal system: Soft  Central nervous system: Alert and oriented. No focal neurological  deficits. Extremities: No edema   Data Reviewed: I have personally reviewed following labs and imaging studies  CBC: Recent Labs  Lab 01/31/24 1112 02/01/24 0522  WBC 24.5* 16.8*  NEUTROABS 19.3*  --   HGB 15.6 15.7  HCT 47.3 47.1  MCV 88.2 89.5  PLT 329 280   Basic Metabolic Panel: Recent Labs  Lab 01/31/24 1112 02/01/24 0522  NA 134* 133*  K 4.4 4.1  CL 99 103  CO2 23 21*  GLUCOSE 101* 101*  BUN 16 21*  CREATININE 0.91 0.97  CALCIUM 8.6* 8.3*  MG  --  2.0  PHOS  --  2.7   GFR: Estimated Creatinine Clearance: 119.4 mL/min (by C-G formula based on SCr of 0.97 mg/dL). Liver Function Tests: Recent Labs  Lab 01/31/24 1112  AST 76*  ALT 76*  ALKPHOS 61  BILITOT 2.1*  PROT 7.9  ALBUMIN 3.4*   No results for input(s): "LIPASE", "AMYLASE" in the last 168 hours. No results for input(s): "AMMONIA" in the last 168 hours. Coagulation Profile: No results for input(s): "INR", "PROTIME" in the last 168 hours. Cardiac Enzymes: No results for input(s): "CKTOTAL", "CKMB", "CKMBINDEX", "TROPONINI" in the last 168 hours. BNP (last 3 results) No results for input(s): "PROBNP" in the last 8760 hours. HbA1C: No results for input(s): "HGBA1C" in the last 72 hours. CBG: No results for input(s): "GLUCAP" in the last 168 hours. Lipid Profile: No results for input(s): "CHOL", "HDL", "LDLCALC", "TRIG", "CHOLHDL", "LDLDIRECT" in the last 72 hours. Thyroid Function Tests: No results for input(s): "TSH", "T4TOTAL", "FREET4", "T3FREE", "THYROIDAB" in the last 72 hours. Anemia Panel: No results for input(s): "VITAMINB12", "FOLATE", "FERRITIN", "TIBC", "IRON", "RETICCTPCT" in the last 72 hours. Sepsis Labs: Recent Labs  Lab 01/31/24 1110  LATICACIDVEN 1.6    Recent Results (from the past 240 hours)  Group A Strep by PCR     Status: Abnormal   Collection Time: 01/25/24 12:21 AM   Specimen: Throat; Sterile Swab  Result Value Ref Range Status   Group A Strep by PCR DETECTED  (A) NOT DETECTED Final    Comment: Performed at [facility], [address] [place], [place] [address]  Blood culture (routine x 2)     Status: Abnormal (Preliminary result)   Collection Time: 01/31/24 11:12 AM   Specimen: BLOOD LEFT ARM  Result Value Ref Range Status   Specimen Description   Final    BLOOD LEFT ARM Performed at [facility], [address], [place], [place] [address]    Special Requests   Final    BOTTLES DRAWN AEROBIC AND ANAEROBIC Blood Culture adequate volume Performed at [facility], [address], [place], [place] [address]    Culture  Setup Time   Final    GRAM POSITIVE COCCI IN CHAINS ANAEROBIC BOTTLE ONLY Organism ID to follow CRITICAL RESULT CALLED TO, READ BACK BY AND VERIFIED WITH: T GREEN,[id number]  02/01/24 MK Performed at [facility], [address], [place], [place] [address]    Culture STREPTOCOCCUS MITIS/ORALIS (A)  Final   Report Status PENDING  Incomplete  Blood culture (routine x 2)  Status: None (Preliminary result)   Collection Time: 01/31/24 11:12 AM   Specimen: BLOOD LEFT ARM  Result Value Ref Range Status   Specimen Description   Final    BLOOD LEFT ARM Performed at [facility], [address], [place], [place] [address]    Special Requests   Final    BOTTLES DRAWN AEROBIC AND ANAEROBIC Blood Culture results may not be optimal due to an inadequate volume of blood received in culture bottles Performed at [facility], [address] [place], [place] [address]    Culture   Final    NO GROWTH 2 DAYS Performed at [facility], [address] [place], [place] [address]    Report Status PENDING  Incomplete  Blood Culture ID Panel (Reflexed)     Status: Abnormal   Collection Time: 01/31/24 11:12 AM  Result Value Ref Range Status   Enterococcus faecalis NOT DETECTED NOT DETECTED Final   Enterococcus Faecium NOT DETECTED NOT DETECTED Final   Listeria  monocytogenes NOT DETECTED NOT DETECTED Final   Staphylococcus species DETECTED (A) NOT DETECTED Final    Comment: CRITICAL RESULT CALLED TO, READ BACK BY AND VERIFIED WITH: T GREEN,PHARMD@[id number]  02/01/24 MK    Staphylococcus aureus (BCID) NOT DETECTED NOT DETECTED Final   Staphylococcus epidermidis DETECTED (A) NOT DETECTED Final    Comment: CRITICAL RESULT CALLED TO, READ BACK BY AND VERIFIED WITH: T GREEN,PHARMD@[id number]  02/01/24 MK    Staphylococcus lugdunensis NOT DETECTED NOT DETECTED Final   Streptococcus species DETECTED (A) NOT DETECTED Final    Comment: Not Enterococcus species, Streptococcus agalactiae, Streptococcus pyogenes, or Streptococcus pneumoniae. CRITICAL RESULT CALLED TO, READ BACK BY AND VERIFIED WITH: T GREEN,PHARMD@[id number]  02/01/24 MK    Streptococcus agalactiae NOT DETECTED NOT DETECTED Final   Streptococcus pneumoniae NOT DETECTED NOT DETECTED Final   Streptococcus pyogenes NOT DETECTED NOT DETECTED Final   A.calcoaceticus-baumannii NOT DETECTED NOT DETECTED Final   Bacteroides fragilis NOT DETECTED NOT DETECTED Final   Enterobacterales NOT DETECTED NOT DETECTED Final   Enterobacter cloacae complex NOT DETECTED NOT DETECTED Final   Escherichia coli NOT DETECTED NOT DETECTED Final   Klebsiella aerogenes NOT DETECTED NOT DETECTED Final   Klebsiella oxytoca NOT DETECTED NOT DETECTED Final   Klebsiella pneumoniae NOT DETECTED NOT DETECTED Final   Proteus species NOT DETECTED NOT DETECTED Final   Salmonella species NOT DETECTED NOT DETECTED Final   Serratia marcescens NOT DETECTED NOT DETECTED Final   Haemophilus influenzae NOT DETECTED NOT DETECTED Final   Neisseria meningitidis NOT DETECTED NOT DETECTED Final   Pseudomonas aeruginosa NOT DETECTED NOT DETECTED Final   Stenotrophomonas maltophilia NOT DETECTED NOT DETECTED Final   Candida albicans NOT DETECTED NOT DETECTED Final   Candida auris NOT DETECTED NOT DETECTED Final   Candida glabrata NOT DETECTED NOT  DETECTED Final   Candida krusei NOT DETECTED NOT DETECTED Final   Candida parapsilosis NOT DETECTED NOT DETECTED Final   Candida tropicalis NOT DETECTED NOT DETECTED Final   Cryptococcus neoformans/gattii NOT DETECTED NOT DETECTED Final   Methicillin resistance mecA/C NOT DETECTED NOT DETECTED Final    Comment: Performed at [facility], [address], [place], [place] [address]         Radiology Studies: CT Head W or Wo Contrast Result Date: 01/31/2024 CLINICAL DATA:  scalp abscess EXAM: CT HEAD WITHOUT CONTRAST TECHNIQUE: Contiguous axial images were obtained from the base of the skull through the vertex without intravenous contrast. RADIATION DOSE REDUCTION: This  exam was performed according to the departmental dose-optimization program which includes automated exposure control, adjustment of the mA and/or kV according to patient size and/or use of iterative reconstruction technique. COMPARISON:  03/27/2023. FINDINGS: Brain: No evidence of acute infarction, hemorrhage, hydrocephalus, extra-axial collection or mass lesion/mass effect. Vascular: No hyperdense vessel or unexpected calcification. Skull: Normal. Negative for fracture or focal lesion. Sinuses/Orbits: No acute finding. IMPRESSION: No acute intracranial process. Electronically Signed   By: Sydell Eva M.D.   On: 01/31/2024 20:25   CT ABDOMEN PELVIS W CONTRAST Result Date: 01/31/2024 CLINICAL DATA:  C/f perirectal abscess. EXAM: CT ABDOMEN AND PELVIS WITH CONTRAST TECHNIQUE: Multidetector CT imaging of the abdomen and pelvis was performed using the standard protocol following bolus administration of intravenous contrast. RADIATION DOSE REDUCTION: This exam was performed according to the departmental dose-optimization program which includes automated exposure control, adjustment of the mA and/or kV according to patient size and/or use of iterative reconstruction technique. CONTRAST:  100mL OMNIPAQUE IOHEXOL 300 MG/ML  SOLN  COMPARISON:  CT scan abdomen and pelvis from 10/31/2018. FINDINGS: Lower chest: There are patchy atelectatic changes in the visualized lung bases. No overt consolidation. No pleural effusion. The heart is normal in size. No pericardial effusion. Hepatobiliary: The liver is normal in size. Non-cirrhotic configuration. No suspicious mass. These is mild diffuse hepatic steatosis. No intrahepatic or extrahepatic bile duct dilation. No calcified gallstones. Normal gallbladder wall thickness. No pericholecystic inflammatory changes. Pancreas: Unremarkable. No pancreatic ductal dilatation or surrounding inflammatory changes. Spleen: Within normal limits. No focal lesion. Adrenals/Urinary Tract: Adrenal glands are unremarkable. No suspicious renal mass. No hydronephrosis. No renal or ureteric calculi. Unremarkable urinary bladder. Stomach/Bowel: No disproportionate dilation of the small or large bowel loops. No evidence of abnormal bowel wall thickening or inflammatory changes. The appendix is unremarkable. Vascular/Lymphatic: No ascites or pneumoperitoneum. No abdominal or pelvic lymphadenopathy, by size criteria. No aneurysmal dilation of the major abdominal arteries. Reproductive: Normal size prostate. Symmetric seminal vesicles. Other: There is asymmetric fullness in the perianal region/perineum. There are subtle ill-defined hypoattenuating areas in this region however, no discrete walled-off abscess seen. There is moderate surrounding fat stranding. Findings represent phlegmonous changes/developing abscess. The soft tissues and abdominal wall are otherwise unremarkable. Musculoskeletal: No suspicious osseous lesions. There are mild multilevel degenerative changes in the visualized spine. 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 IMPRESSION: 1. There is asymmetric fullness in the perianal region/perineum with subtle ill-defined hypoattenuating areas and moderate surrounding fat stranding. Findings  represent phlegmonous changes/developing abscess. No discrete walled-off drainable abscess seen at this time. 2. Multiple other nonacute observations, as described above. Electronically Signed   By: Beula Brunswick M.D.   On: 01/31/2024 15:14    Scheduled Meds:  bictegravir-emtricitabine-tenofovir AF  1 tablet Oral Daily   sodium chloride flush  3 mL Intravenous Q12H   Continuous Infusions:  piperacillin-tazobactam 3.375 g (02/02/24 0632)   vancomycin 1,500 mg (02/02/24 1124)     LOS: 2 days    Time spent: 38 min  Barbee Lew, MD 02/02/2024, 1:46 PM

## 2024-02-02 NOTE — Plan of Care (Signed)

## 2024-02-02 NOTE — Plan of Care (Signed)
  Problem: Education: Goal: Knowledge of General Education information will improve Description: Including pain rating scale, medication(s)/side effects and non-pharmacologic comfort measures Outcome: Progressing   Problem: Health Behavior/Discharge Planning: Goal: Ability to manage health-related needs will improve Outcome: Progressing   Problem: Clinical Measurements: Goal: Ability to maintain clinical measurements within normal limits will improve Outcome: Progressing Goal: Will remain free from infection Outcome: Progressing Goal: Diagnostic test results will improve Outcome: Progressing Goal: Respiratory complications will improve Outcome: Progressing Goal: Cardiovascular complication will be avoided Outcome: Progressing   Problem: Nutrition: Goal: Adequate nutrition will be maintained Outcome: Progressing   Problem: Activity: Goal: Risk for activity intolerance will decrease Outcome: Progressing   Problem: Coping: Goal: Level of anxiety will decrease Outcome: Progressing   Problem: Elimination: Goal: Will not experience complications related to bowel motility Outcome: Progressing Goal: Will not experience complications related to urinary retention Outcome: Progressing   Problem: Safety: Goal: Ability to remain free from injury will improve Outcome: Progressing   Problem: Pain Managment: Goal: General experience of comfort will improve and/or be controlled Outcome: Progressing   Problem: Elimination: Goal: Will not experience complications related to bowel motility Outcome: Progressing Goal: Will not experience complications related to urinary retention Outcome: Progressing   Problem: Skin Integrity: Goal: Risk for impaired skin integrity will decrease Outcome: Progressing   Problem: Safety: Goal: Ability to remain free from injury will improve Outcome: Progressing

## 2024-02-03 ENCOUNTER — Inpatient Hospital Stay (HOSPITAL_COMMUNITY)

## 2024-02-03 DIAGNOSIS — K611 Rectal abscess: Secondary | ICD-10-CM | POA: Diagnosis not present

## 2024-02-03 LAB — CULTURE, BLOOD (ROUTINE X 2): Special Requests: ADEQUATE

## 2024-02-03 LAB — RAPID URINE DRUG SCREEN, HOSP PERFORMED
Amphetamines: NOT DETECTED
Barbiturates: NOT DETECTED
Benzodiazepines: NOT DETECTED
Cocaine: NOT DETECTED
Opiates: NOT DETECTED
Tetrahydrocannabinol: NOT DETECTED

## 2024-02-03 MED ORDER — IOHEXOL 300 MG/ML  SOLN
100.0000 mL | Freq: Once | INTRAMUSCULAR | Status: AC | PRN
  Administered 2024-02-03: 100 mL via INTRAVENOUS

## 2024-02-03 MED ORDER — SODIUM CHLORIDE (PF) 0.9 % IJ SOLN
INTRAMUSCULAR | Status: AC
  Filled 2024-02-03: qty 50

## 2024-02-03 MED ORDER — IOHEXOL 9 MG/ML PO SOLN
1000.0000 mL | ORAL | Status: AC
  Administered 2024-02-03: 1000 mL via ORAL

## 2024-02-03 MED ORDER — MORPHINE SULFATE (PF) 2 MG/ML IV SOLN
2.0000 mg | Freq: Once | INTRAVENOUS | Status: AC
  Administered 2024-02-03: 2 mg via INTRAVENOUS
  Filled 2024-02-03: qty 1

## 2024-02-03 MED ORDER — IOHEXOL 9 MG/ML PO SOLN
ORAL | Status: AC
Start: 2024-02-03 — End: ?
  Filled 2024-02-03: qty 1000

## 2024-02-03 MED ORDER — LURASIDONE HCL 40 MG PO TABS
40.0000 mg | ORAL_TABLET | Freq: Every day | ORAL | Status: DC
  Administered 2024-02-03 – 2024-02-07 (×5): 40 mg via ORAL
  Filled 2024-02-03 (×5): qty 1

## 2024-02-03 NOTE — Progress Notes (Signed)
 PROGRESS NOTE    Pedro AMADON  GNF:[id number] DOB: [birth year] DOA: 01/31/2024 PCP: Pcp, No  Brief Narrative:33 year old male with PMH recurrent cellulitis HIV, HCV, syphilis s/p treatment, septic PE not on AC, HTN, bipolar d/co, PNES who presented with rectal pain.  He reports that he developed pruritus and began scratching around his rectum.  After a few days he began developing perirectal pain and swelling on the right buttock.  He denies any drainage from the area nor any fevers at home. In in, he had also complained of developing a lesion on his left scalp that had very mildly blood at some point as well. There was concern for nonadherence to his Biktarvy however states he is usually compliant but missed approximately 2 weeks of treatment recently.   CT abdomen/pelvis 4/14-was obtained which showed asymmetric fullness in the perianal region/perineum with ill-defined hypoattenuating area and moderate surrounding fat stranding at the area of concern.  There were phlegmonous changes but no obvious abscess as of yet. He was started on vancomycin and Zosyn.  Assessment & Plan:   Principal Problem:   Perirectal cellulitis Active Problems:   Sepsis (HCC)   Positive blood culture   HIV (human immunodeficiency virus infection) (HCC)   Furuncle of scalp   Chronic hepatitis C without hepatic coma (HCC)   Group A streptococcal infection    Perirectal cellulitis-with 4 cm area of induration and CT evidence of the same WBCs improving on vancomycin and Zosyn Repeat CT 02/03/2024 Appreciate ID consult Chart review noted multiple GI appointments were missed supposed to have EGD colonoscopies etc.   Positive blood culture with staph and Streptococcus mitis Continue current antibiotics  ID consulted and appreciated   sepsis (HCC) - fever, tachycardia, leukocytosis; perirectal area concerning for cellulitis vs early abscess - Continue antibiotics as noted above   Furuncle of scalp - Left  scalp noted with very small vertical with overlying scab; still no fluctuance or warmth -Continue antibiotics as noted -CT head unremarkable   HIV (human immunodeficiency virus infection) (HCC) - Mostly compliant but endorses missing approximately 2 weeks of treatment recently -Continue Biktarvy -CD4 346 down from 834 in January 2025   Group A streptococcal infection - Recent positive group A strep test on 01/25/2024 -Has been on 10-day course of amoxicillin outpatient; currently covered while on Zosyn    Estimated body mass index is 25.03 kg/m as calculated from the following:   Height as of this encounter: 5' 11.5" (1.816 m).   Weight as of this encounter: 82.6 kg.  DVT prophylaxis: scd Code Status: full Family Communication: none Disposition Plan:  Status is: Inpatient Remains inpatient appropriate because: vanco zosyn   Consultants: ID consulted  Procedures: None Antimicrobials: Vanco and Zosyn  Subjective: Nurse reported the abscess burst overnight and he was in severe pain  Objective: Vitals:   02/02/24 0519 02/02/24 1432 02/02/24 2016 02/03/24 0435  BP: 123/86 138/80 (!) 145/84 (!) 114/92  Pulse: 71 67 70 (!) 57  Resp: 18 17 16 16   Temp: 98.3 F (36.8 C) 99.1 F (37.3 C) 98.3 F (36.8 C) 97.8 F (36.6 C)  TempSrc:   Oral   SpO2: 98% 100% 99% 99%  Weight:      Height:        Intake/Output Summary (Last 24 hours) at 02/03/2024 1557 Last data filed at 02/03/2024 0705 Gross per 24 hour  Intake 1021.6 ml  Output 325 ml  Net 696.6 ml   Filed Weights   01/31/24 0516  Weight: 82.6 kg    Examination:  General exam: Appears chronically ill-appearing Respiratory system: Clear to auscultation. Respiratory effort normal. Cardiovascular system: Regular Gastrointestinal system: Soft  Central nervous system: Alert and oriented. No focal neurological deficits. Extremities: No edema   Data Reviewed: I have personally reviewed following labs and imaging  studies  CBC: Recent Labs  Lab 01/31/24 1112 02/01/24 0522  WBC 24.5* 16.8*  NEUTROABS 19.3*  --   HGB 15.6 15.7  HCT 47.3 47.1  MCV 88.2 89.5  PLT 329 280   Basic Metabolic Panel: Recent Labs  Lab 01/31/24 1112 02/01/24 0522  NA 134* 133*  K 4.4 4.1  CL 99 103  CO2 23 21*  GLUCOSE 101* 101*  BUN 16 21*  CREATININE 0.91 0.97  CALCIUM 8.6* 8.3*  MG  --  2.0  PHOS  --  2.7   GFR: Estimated Creatinine Clearance: 119.4 mL/min (by C-G formula based on SCr of 0.97 mg/dL). Liver Function Tests: Recent Labs  Lab 01/31/24 1112  AST 76*  ALT 76*  ALKPHOS 61  BILITOT 2.1*  PROT 7.9  ALBUMIN 3.4*   No results for input(s): "LIPASE", "AMYLASE" in the last 168 hours. No results for input(s): "AMMONIA" in the last 168 hours. Coagulation Profile: No results for input(s): "INR", "PROTIME" in the last 168 hours. Cardiac Enzymes: No results for input(s): "CKTOTAL", "CKMB", "CKMBINDEX", "TROPONINI" in the last 168 hours. BNP (last 3 results) No results for input(s): "PROBNP" in the last 8760 hours. HbA1C: No results for input(s): "HGBA1C" in the last 72 hours. CBG: No results for input(s): "GLUCAP" in the last 168 hours. Lipid Profile: No results for input(s): "CHOL", "HDL", "LDLCALC", "TRIG", "CHOLHDL", "LDLDIRECT" in the last 72 hours. Thyroid Function Tests: No results for input(s): "TSH", "T4TOTAL", "FREET4", "T3FREE", "THYROIDAB" in the last 72 hours. Anemia Panel: No results for input(s): "VITAMINB12", "FOLATE", "FERRITIN", "TIBC", "IRON", "RETICCTPCT" in the last 72 hours. Sepsis Labs: Recent Labs  Lab 01/31/24 1110  LATICACIDVEN 1.6    Recent Results (from the past 240 hours)  Group A Strep by PCR     Status: Abnormal   Collection Time: 01/25/24 12:21 AM   Specimen: Throat; Sterile Swab  Result Value Ref Range Status   Group A Strep by PCR DETECTED (A) NOT DETECTED Final    Comment: Performed at [facility], [address], [place], [place]  [address]  Blood culture (routine x 2)     Status: Abnormal   Collection Time: 01/31/24 11:12 AM   Specimen: BLOOD LEFT ARM  Result Value Ref Range Status   Specimen Description   Final    BLOOD LEFT ARM Performed at [facility], [address] [place], [place] [address]    Special Requests   Final    BOTTLES DRAWN AEROBIC AND ANAEROBIC Blood Culture adequate volume Performed at [facility], [address], [place], [place] [address]    Culture  Setup Time   Final    GRAM POSITIVE COCCI IN CHAINS ANAEROBIC BOTTLE ONLY Organism ID to follow CRITICAL RESULT CALLED TO, READ BACK BY AND VERIFIED WITH: T GREEN,[id number]  02/01/24 MK Performed at [facility], [address] [place], [place] [address]    Culture STREPTOCOCCUS MITIS/ORALIS (A)  Final   Report Status 02/03/2024 FINAL  Final   Organism ID, Bacteria STREPTOCOCCUS MITIS/ORALIS  Final      Susceptibility   Streptococcus mitis/oralis - MIC*    PENICILLIN 0.25 INTERMEDIATE Intermediate  CEFTRIAXONE <=0.12 SENSITIVE Sensitive     LEVOFLOXACIN 1 SENSITIVE Sensitive     VANCOMYCIN 0.5 SENSITIVE Sensitive     * STREPTOCOCCUS MITIS/ORALIS  Blood culture (routine x 2)     Status: None (Preliminary result)   Collection Time: 01/31/24 11:12 AM   Specimen: BLOOD LEFT ARM  Result Value Ref Range Status   Specimen Description   Final    BLOOD LEFT ARM Performed at [facility], [address], [place], [place] [address]    Special Requests   Final    BOTTLES DRAWN AEROBIC AND ANAEROBIC Blood Culture results may not be optimal due to an inadequate volume of blood received in culture bottles Performed at [facility], [address] [place], [place] [address]    Culture   Final    NO GROWTH 3 DAYS Performed at [facility], [address] [place], [place] [address]    Report Status PENDING  Incomplete  Blood Culture ID Panel (Reflexed)     Status: Abnormal    Collection Time: 01/31/24 11:12 AM  Result Value Ref Range Status   Enterococcus faecalis NOT DETECTED NOT DETECTED Final   Enterococcus Faecium NOT DETECTED NOT DETECTED Final   Listeria monocytogenes NOT DETECTED NOT DETECTED Final   Staphylococcus species DETECTED (A) NOT DETECTED Final    Comment: CRITICAL RESULT CALLED TO, READ BACK BY AND VERIFIED WITH: T GREEN,PHARMD@[id number]  02/01/24 MK    Staphylococcus aureus (BCID) NOT DETECTED NOT DETECTED Final   Staphylococcus epidermidis DETECTED (A) NOT DETECTED Final    Comment: CRITICAL RESULT CALLED TO, READ BACK BY AND VERIFIED WITH: T GREEN,PHARMD@[id number]  02/01/24 MK    Staphylococcus lugdunensis NOT DETECTED NOT DETECTED Final   Streptococcus species DETECTED (A) NOT DETECTED Final    Comment: Not Enterococcus species, Streptococcus agalactiae, Streptococcus pyogenes, or Streptococcus pneumoniae. CRITICAL RESULT CALLED TO, READ BACK BY AND VERIFIED WITH: T GREEN,PHARMD@[id number]  02/01/24 MK    Streptococcus agalactiae NOT DETECTED NOT DETECTED Final   Streptococcus pneumoniae NOT DETECTED NOT DETECTED Final   Streptococcus pyogenes NOT DETECTED NOT DETECTED Final   A.calcoaceticus-baumannii NOT DETECTED NOT DETECTED Final   Bacteroides fragilis NOT DETECTED NOT DETECTED Final   Enterobacterales NOT DETECTED NOT DETECTED Final   Enterobacter cloacae complex NOT DETECTED NOT DETECTED Final   Escherichia coli NOT DETECTED NOT DETECTED Final   Klebsiella aerogenes NOT DETECTED NOT DETECTED Final   Klebsiella oxytoca NOT DETECTED NOT DETECTED Final   Klebsiella pneumoniae NOT DETECTED NOT DETECTED Final   Proteus species NOT DETECTED NOT DETECTED Final   Salmonella species NOT DETECTED NOT DETECTED Final   Serratia marcescens NOT DETECTED NOT DETECTED Final   Haemophilus influenzae NOT DETECTED NOT DETECTED Final   Neisseria meningitidis NOT DETECTED NOT DETECTED Final   Pseudomonas aeruginosa NOT DETECTED NOT DETECTED Final    Stenotrophomonas maltophilia NOT DETECTED NOT DETECTED Final   Candida albicans NOT DETECTED NOT DETECTED Final   Candida auris NOT DETECTED NOT DETECTED Final   Candida glabrata NOT DETECTED NOT DETECTED Final   Candida krusei NOT DETECTED NOT DETECTED Final   Candida parapsilosis NOT DETECTED NOT DETECTED Final   Candida tropicalis NOT DETECTED NOT DETECTED Final   Cryptococcus neoformans/gattii NOT DETECTED NOT DETECTED Final   Methicillin resistance mecA/C NOT DETECTED NOT DETECTED Final    Comment: Performed at [facility], [address], [place], [place] [address]  Culture, blood (Routine X 2) w Reflex to ID Panel  Status: None (Preliminary result)   Collection Time: 02/02/24  7:39 PM   Specimen: BLOOD RIGHT HAND  Result Value Ref Range Status   Specimen Description   Final    BLOOD RIGHT HAND Performed at [facility], [address], [place], [place] [address]    Special Requests   Final    BOTTLES DRAWN AEROBIC AND ANAEROBIC Blood Culture adequate volume Performed at [facility], [address], [place], [place] [address]    Culture   Final    NO GROWTH < 12 HOURS Performed at [facility], [address] [place], [place] [address]    Report Status PENDING  Incomplete  Culture, blood (Routine X 2) w Reflex to ID Panel     Status: None (Preliminary result)   Collection Time: 02/02/24  7:44 PM   Specimen: BLOOD RIGHT HAND  Result Value Ref Range Status   Specimen Description   Final    BLOOD RIGHT HAND Performed at [facility], [address], [place], [place] [address]    Special Requests   Final    BOTTLES DRAWN AEROBIC AND ANAEROBIC Blood Culture adequate volume Performed at [facility], [address] [place], [place] [address]    Culture  Setup Time   Final    GRAM POSITIVE COCCI IN CLUSTERS ANAEROBIC BOTTLE ONLY CRITICAL VALUE NOTED.  VALUE IS CONSISTENT WITH PREVIOUSLY REPORTED AND CALLED  VALUE. Performed at [facility], [address] [place], [place] [address]    Culture GRAM POSITIVE COCCI IN CLUSTERS  Final   Report Status PENDING  Incomplete         Radiology Studies: No results found.   Scheduled Meds:  bictegravir-emtricitabine-tenofovir AF  1 tablet Oral Daily   lurasidone  40 mg Oral Q breakfast   sodium chloride flush  3 mL Intravenous Q12H   Continuous Infusions:  piperacillin-tazobactam 3.375 g (02/03/24 1438)   vancomycin 1,500 mg (02/03/24 1106)     LOS: 3 days    Time spent: 38 min  Barbee Lew, MD 02/03/2024, 3:57 PM

## 2024-02-03 NOTE — Plan of Care (Signed)

## 2024-02-03 NOTE — Plan of Care (Signed)
  Problem: Education: Goal: Knowledge of General Education information will improve Description: Including pain rating scale, medication(s)/side effects and non-pharmacologic comfort measures Outcome: Progressing   Problem: Clinical Measurements: Goal: Will remain free from infection Outcome: Progressing Goal: Diagnostic test results will improve Outcome: Progressing   Problem: Pain Managment: Goal: General experience of comfort will improve and/or be controlled Outcome: Progressing

## 2024-02-04 ENCOUNTER — Other Ambulatory Visit (HOSPITAL_COMMUNITY)

## 2024-02-04 ENCOUNTER — Inpatient Hospital Stay (HOSPITAL_COMMUNITY)

## 2024-02-04 DIAGNOSIS — R7881 Bacteremia: Secondary | ICD-10-CM | POA: Diagnosis not present

## 2024-02-04 DIAGNOSIS — K611 Rectal abscess: Secondary | ICD-10-CM | POA: Diagnosis not present

## 2024-02-04 LAB — CBC
HCT: 46.1 % (ref 39.0–52.0)
Hemoglobin: 15.4 g/dL (ref 13.0–17.0)
MCH: 29.1 pg (ref 26.0–34.0)
MCHC: 33.4 g/dL (ref 30.0–36.0)
MCV: 87.1 fL (ref 80.0–100.0)
Platelets: 375 10*3/uL (ref 150–400)
RBC: 5.29 MIL/uL (ref 4.22–5.81)
RDW: 12.8 % (ref 11.5–15.5)
WBC: 11 10*3/uL — ABNORMAL HIGH (ref 4.0–10.5)
nRBC: 0 % (ref 0.0–0.2)

## 2024-02-04 LAB — GC/CHLAMYDIA PROBE AMP (~~LOC~~) NOT AT ARMC
Chlamydia: NEGATIVE
Chlamydia: NEGATIVE
Chlamydia: NEGATIVE
Comment: NEGATIVE
Comment: NORMAL
Comment: NEGATIVE
Comment: NEGATIVE
Comment: NORMAL
Comment: NORMAL
Neisseria Gonorrhea: NEGATIVE
Neisseria Gonorrhea: NEGATIVE
Neisseria Gonorrhea: NEGATIVE

## 2024-02-04 LAB — CULTURE, BLOOD (ROUTINE X 2): Special Requests: ADEQUATE

## 2024-02-04 LAB — ECHOCARDIOGRAM COMPLETE BUBBLE STUDY
AR max vel: 3.15 cm2
AV Peak grad: 4.2 mmHg
Ao pk vel: 1.02 m/s
Area-P 1/2: 3.42 cm2
S' Lateral: 4.1 cm

## 2024-02-04 MED ORDER — LACTULOSE 10 GM/15ML PO SOLN
20.0000 g | Freq: Two times a day (BID) | ORAL | Status: AC
  Administered 2024-02-04 – 2024-02-06 (×2): 20 g via ORAL
  Filled 2024-02-04 (×6): qty 30

## 2024-02-04 MED ORDER — LINEZOLID 600 MG/300ML IV SOLN
600.0000 mg | Freq: Two times a day (BID) | INTRAVENOUS | Status: DC
  Administered 2024-02-04 – 2024-02-07 (×7): 600 mg via INTRAVENOUS
  Filled 2024-02-04 (×7): qty 300

## 2024-02-04 MED ORDER — SODIUM CHLORIDE 0.9 % IV SOLN
3.0000 g | Freq: Four times a day (QID) | INTRAVENOUS | Status: DC
  Administered 2024-02-04 – 2024-02-07 (×13): 3 g via INTRAVENOUS
  Filled 2024-02-04 (×13): qty 8

## 2024-02-04 MED ORDER — PERFLUTREN LIPID MICROSPHERE
1.0000 mL | INTRAVENOUS | Status: AC | PRN
Start: 2024-02-04 — End: 2024-02-04
  Administered 2024-02-04: 3 mL via INTRAVENOUS

## 2024-02-04 NOTE — Progress Notes (Signed)
 [facility] Infectious Diseases Follow Up Note  Patient Identification: Patient Name: Pedro Owens MRN: [id number] Admit Date: 01/31/2024  5:06 AM Age: 33 y.o.Today's Date: 02/04/2024  Reason for Visit: Perirectal folliculitis, furuncle  Principal Problem:   Perirectal cellulitis Active Problems:   HIV (human immunodeficiency virus infection) (HCC)   Chronic hepatitis C without hepatic coma (HCC)   Sepsis (HCC)   Furuncle of scalp   Group A streptococcal infection   Positive blood culture  Antibiotics:  Vancomycin  4/14- Zosyn  4/14-   Lines/Hardware:  Interval Events: Remains afebrile.  No labs today. Repeat CT with some improvement of subcutaneous soft tissue stranding in the right medial gluteal graft   Assessment 33 YO Male with prior h/o as below including HIV, HCV, IVDU, septic PE, HTN, asthma,  neurosyphilis, Gonorrhea, Herpes, Anal warts,  conversion d/o/pseudoseizure/Bipolar d/o/Anxiety/Depression  who presented to ED 4/14 with scalp abscess and perirectal abscess. He reports scratching in his perirectal area few weeks ago and thinks he might have had an injury. He then had increasing swelling and pain in the scalp as well as near rectum to the extent he had difficulty with BM.   # Perirectal abscess/possible left scalp furunce - MSM, denies recent receptive anal intercourse - 4/14 urine/oral and anal GC negative  - clinically improving    # Strep mitis/oralis 1/4 bottles ( pen I) -  could be a contaminant vs true bacteremia - 4/16 blood cx NG in 2 days  - TTE negative for vegetations or endocarditis   # Recent Group A strep throat  - s/p treatment   # HIV - off of Biktarvy  for 2/5 weeks PTA Lab Results  Component Value Date   HIV1RNAQUANT 284 (H) 11/17/2023   Lab Results  Component Value Date   CD4TABS 346 (L) 01/31/2024   CD4TABS 834 11/17/2023   CD4TABS 475 09/22/2023   # Syphilis/Neurosyphilis s/p  tx - RPR down to 1: 8   # HCV, not treated   Recommendations - Will switch vancomycin  to linezolid  IV and Zosyn  to IV Unasyn . If continues to improve by Saturday/Sunday, could switch to PO doxycycline  and augmentin  to complete 2 weeks course. EOT 02/14/24 - Continue Biktarvy . Fu HIV RNA - Universal/standard isolation precautions.  - Patient has a fu appt with Dr Luiz on 5/9 at 9 am  - ID will so for now, recall back with questions or concerns   Rest of the management as per the primary team. Thank you for the consult. Please page with pertinent questions or concerns.  ______________________________________________________________________ Subjective patient seen and examined at the bedside.  Still has been in the perirectal area but cannot feel swelling in the scalp anymore.  No concerns with antibiotics  Vitals BP 115/72 (BP Location: Right Arm)   Pulse 60   Temp 97.8 F (36.6 C)   Resp 18   Ht 5' 11.5 (1.816 m)   Wt 82.6 kg   SpO2 95%   BMI 25.03 kg/m     Physical Exam Constitutional: Adult male lying in the bed, nontoxic-appearing    Comments:   Cardiovascular:     Rate and Rhythm: Normal rate and regular rhythm.     Heart sounds:   Pulmonary:     Effort: Pulmonary effort is normal.     Comments:   Abdominal:     Palpations: Abdomen is nondistended    Tenderness:   Musculoskeletal:        General: No swelling or tenderness in peripheral joints  GU exam chaperoned Right-sided perirectal area with improvement in swelling and induration but not completely resolved.  No overlying erythema or warmth or drainage or fluctuance or crepitus.  No perianal ulcers  Skin:    Comments: No rashes.  Prior left scalp swelling significantly improved in size, less than 0.5*0.5cm  Neurological:     General: Awake, alert and oriented, grossly nonfocal  Psychiatric:        Mood and Affect: Mood normal.   Pertinent Microbiology Results for orders placed or performed  during the hospital encounter of 01/31/24  Blood culture (routine x 2)     Status: Abnormal   Collection Time: 01/31/24 11:12 AM   Specimen: BLOOD LEFT ARM  Result Value Ref Range Status   Specimen Description   Final    BLOOD LEFT ARM Performed at [facility], [address], [place], [place] [address]    Special Requests   Final    BOTTLES DRAWN AEROBIC AND ANAEROBIC Blood Culture adequate volume Performed at [facility], [address] [place], [place] [address]    Culture  Setup Time   Final    GRAM POSITIVE COCCI IN CHAINS ANAEROBIC BOTTLE ONLY Organism ID to follow CRITICAL RESULT CALLED TO, READ BACK BY AND VERIFIED WITH: T GREEN,[id number]  02/01/24 MK Performed at [facility], [address], [place], [place] [address]    Culture STREPTOCOCCUS MITIS/ORALIS (A)  Final   Report Status 02/03/2024 FINAL  Final   Organism ID, Bacteria STREPTOCOCCUS MITIS/ORALIS  Final      Susceptibility   Streptococcus mitis/oralis - MIC*    PENICILLIN  0.25 INTERMEDIATE Intermediate     CEFTRIAXONE  <=0.12 SENSITIVE Sensitive     LEVOFLOXACIN  1 SENSITIVE Sensitive     VANCOMYCIN  0.5 SENSITIVE Sensitive     * STREPTOCOCCUS MITIS/ORALIS  Blood culture (routine x 2)     Status: None (Preliminary result)   Collection Time: 01/31/24 11:12 AM   Specimen: BLOOD LEFT ARM  Result Value Ref Range Status   Specimen Description   Final    BLOOD LEFT ARM Performed at [facility], [address] [place], [place] [address]    Special Requests   Final    BOTTLES DRAWN AEROBIC AND ANAEROBIC Blood Culture results may not be optimal due to an inadequate volume of blood received in culture bottles Performed at [facility], [address], [place], [place] [address]    Culture   Final    NO GROWTH 4 DAYS Performed at [facility], [address], [place], [place] [address]    Report Status PENDING  Incomplete  Blood Culture ID Panel  (Reflexed)     Status: Abnormal   Collection Time: 01/31/24 11:12 AM  Result Value Ref Range Status   Enterococcus faecalis NOT DETECTED NOT DETECTED Final   Enterococcus Faecium NOT DETECTED NOT DETECTED Final   Listeria monocytogenes NOT DETECTED NOT DETECTED Final   Staphylococcus species DETECTED (A) NOT DETECTED Final    Comment: CRITICAL RESULT CALLED TO, READ BACK BY AND VERIFIED WITH: T GREEN,PHARMD@[id number]  02/01/24 MK    Staphylococcus aureus (BCID) NOT DETECTED NOT DETECTED Final   Staphylococcus epidermidis DETECTED (A) NOT DETECTED Final    Comment: CRITICAL RESULT CALLED TO, READ BACK BY AND VERIFIED WITH: T GREEN,PHARMD@[id number]  02/01/24 MK    Staphylococcus lugdunensis NOT DETECTED NOT DETECTED Final   Streptococcus species DETECTED (A) NOT DETECTED Final    Comment: Not Enterococcus species, Streptococcus  agalactiae, Streptococcus pyogenes, or Streptococcus pneumoniae. CRITICAL RESULT CALLED TO, READ BACK BY AND VERIFIED WITH: T GREEN,PHARMD@[id number]  02/01/24 MK    Streptococcus agalactiae NOT DETECTED NOT DETECTED Final   Streptococcus pneumoniae NOT DETECTED NOT DETECTED Final   Streptococcus pyogenes NOT DETECTED NOT DETECTED Final   A.calcoaceticus-baumannii NOT DETECTED NOT DETECTED Final   Bacteroides fragilis NOT DETECTED NOT DETECTED Final   Enterobacterales NOT DETECTED NOT DETECTED Final   Enterobacter cloacae complex NOT DETECTED NOT DETECTED Final   Escherichia coli NOT DETECTED NOT DETECTED Final   Klebsiella aerogenes NOT DETECTED NOT DETECTED Final   Klebsiella oxytoca NOT DETECTED NOT DETECTED Final   Klebsiella pneumoniae NOT DETECTED NOT DETECTED Final   Proteus species NOT DETECTED NOT DETECTED Final   Salmonella species NOT DETECTED NOT DETECTED Final   Serratia marcescens NOT DETECTED NOT DETECTED Final   Haemophilus influenzae NOT DETECTED NOT DETECTED Final   Neisseria meningitidis NOT DETECTED NOT DETECTED Final   Pseudomonas aeruginosa NOT DETECTED  NOT DETECTED Final   Stenotrophomonas maltophilia NOT DETECTED NOT DETECTED Final   Candida albicans NOT DETECTED NOT DETECTED Final   Candida auris NOT DETECTED NOT DETECTED Final   Candida glabrata NOT DETECTED NOT DETECTED Final   Candida krusei NOT DETECTED NOT DETECTED Final   Candida parapsilosis NOT DETECTED NOT DETECTED Final   Candida tropicalis NOT DETECTED NOT DETECTED Final   Cryptococcus neoformans/gattii NOT DETECTED NOT DETECTED Final   Methicillin resistance mecA/C NOT DETECTED NOT DETECTED Final    Comment: Performed at [facility], [address], [place], [place] [address]  Culture, blood (Routine X 2) w Reflex to ID Panel     Status: None (Preliminary result)   Collection Time: 02/02/24  7:39 PM   Specimen: BLOOD RIGHT HAND  Result Value Ref Range Status   Specimen Description   Final    BLOOD RIGHT HAND Performed at [facility], [address], [place], [place] [address]    Special Requests   Final    BOTTLES DRAWN AEROBIC AND ANAEROBIC Blood Culture adequate volume Performed at [facility], [address], [place], [place] [address]    Culture   Final    NO GROWTH 2 DAYS Performed at [facility], [address], [place], [place] [address]    Report Status PENDING  Incomplete  Culture, blood (Routine X 2) w Reflex to ID Panel     Status: None (Preliminary result)   Collection Time: 02/02/24  7:44 PM   Specimen: BLOOD RIGHT HAND  Result Value Ref Range Status   Specimen Description   Final    BLOOD RIGHT HAND Performed at [facility], [address], [place], [place] [address]    Special Requests   Final    BOTTLES DRAWN AEROBIC AND ANAEROBIC Blood Culture adequate volume Performed at [facility], [address] [place], [place] [address]    Culture  Setup Time   Final    GRAM POSITIVE COCCI IN CLUSTERS ANAEROBIC BOTTLE ONLY CRITICAL VALUE NOTED.  VALUE IS CONSISTENT WITH PREVIOUSLY  REPORTED AND CALLED VALUE. Performed at [facility], [address], [place], [place] [address]    Culture GRAM POSITIVE COCCI IN CLUSTERS  Final   Report Status PENDING  Incomplete    Pertinent Lab.    Latest Ref Rng & Units 02/01/2024    5:22 AM 01/31/2024   11:12 AM 09/23/2023    8:40 AM  CBC  WBC 4.0 - 10.5 K/uL 16.8  24.5  7.8   Hemoglobin 13.0 - 17.0 g/dL 84.2  84.3  85.3   Hematocrit 39.0 - 52.0 % 47.1  47.3  43.0   Platelets 150 - 400 K/uL 280  329  317       Latest Ref Rng & Units 02/01/2024    5:22 AM 01/31/2024   11:12 AM 09/23/2023    8:40 AM  CMP  Glucose 70 - 99 mg/dL 898  898  88   BUN 6 - 20 mg/dL 21  16  20    Creatinine 0.61 - 1.24 mg/dL 9.02  9.08  9.07   Sodium 135 - 145 mmol/L 133  134  137   Potassium 3.5 - 5.1 mmol/L 4.1  4.4  3.9   Chloride 98 - 111 mmol/L 103  99  101   CO2 22 - 32 mmol/L 21  23  28    Calcium 8.9 - 10.3 mg/dL 8.3  8.6  8.8   Total Protein 6.5 - 8.1 g/dL  7.9    Total Bilirubin 0.0 - 1.2 mg/dL  2.1    Alkaline Phos 38 - 126 U/L  61    AST 15 - 41 U/L  76    ALT 0 - 44 U/L  76     Pertinent Imaging today Plain films and CT images have been personally visualized and interpreted; radiology reports have been reviewed. Decision making incorporated into the Impression /   CT ABDOMEN PELVIS W CONTRAST Result Date: 02/03/2024 CLINICAL DATA:  Perirectal cellulitis EXAM: CT ABDOMEN AND PELVIS WITH CONTRAST TECHNIQUE: Multidetector CT imaging of the abdomen and pelvis was performed using the standard protocol following bolus administration of intravenous contrast. RADIATION DOSE REDUCTION: This exam was performed according to the departmental dose-optimization program which includes automated exposure control, adjustment of the mA and/or kV according to patient size and/or use of iterative reconstruction technique. CONTRAST:  OMNIPAQUE  IOHEXOL  300 MG/ML  SOLN COMPARISON:  CT abdomen and pelvis dated 01/31/2024 FINDINGS: Lower chest: No  focal consolidation or pulmonary nodule in the lung bases. No pleural effusion or pneumothorax demonstrated. Partially imaged heart size is normal. Hepatobiliary: No focal hepatic lesions. No intra or extrahepatic biliary ductal dilation. Normal gallbladder. Pancreas: No focal lesions or main ductal dilation. Spleen: Normal in size without focal abnormality. Adrenals/Urinary Tract: No adrenal nodules. No suspicious renal mass, calculi or hydronephrosis. No focal bladder wall thickening. Stomach/Bowel: Normal appearance of the stomach. Short segment mural thickening of the ascending colon. No abnormal bowel dilation. Moderate volume stool throughout the colon. Normal appendix. Vascular/Lymphatic: No significant vascular findings are present. No enlarged abdominal or pelvic lymph nodes. Reproductive: Prostate is unremarkable. Other: Trace right hemi abdominal free fluid. No free air or fluid collection. Musculoskeletal: No acute or abnormal lytic or blastic osseous lesions. Increased asymmetric thickening of the right levator ani, which demonstrates hyperenhancement. Persistent subcutaneous soft tissue thickening along the right medial gluteal cleft. The degree of adjacent subcutaneous soft tissue stranding is decreased. No discrete fluid collection. IMPRESSION: 1. Decreased but persistent subcutaneous soft tissue thickening along the right medial gluteal cleft with increased asymmetric thickening of the right levator ani. No discrete fluid collection. 2. Short segment mural thickening of the ascending colon, which may be related to peristalsis or colitis. 3. Moderate volume stool throughout the colon. Recommend correlation with constipation. Electronically Signed   By: Limin  Xu M.D.   On: 02/03/2024 18:01   I have personally spent  50 minutes involved in face-to-face and non-face-to-face activities for this patient on the day of the visit. Professional time spent includes the following activities: Preparing to see  the patient (review of tests), Obtaining and/or reviewing separately obtained history (admission/discharge record), Performing a medically appropriate examination and/or evaluation , Ordering medications/tests/procedures, referring and communicating with other health care professionals, Documenting clinical information in the EMR, Independently interpreting results (not separately reported), Communicating results to the patient/family/caregiver, Counseling and educating the patient/family/caregiver and Care coordination (not separately reported).   Plan d/w requesting provider as well as ID pharm D  Of note, portions of this note may have been created with voice recognition software. While this note has been edited for accuracy, occasional wrong-word or 'sound-a-like' substitutions may have occurred due to the inherent limitations of voice recognition software.   Electronically signed by:   Annalee Orem, MD Infectious Disease Physician [facility] Pager: [phone]

## 2024-02-04 NOTE — Plan of Care (Signed)

## 2024-02-04 NOTE — Plan of Care (Addendum)
 VSS. Patient given one time dose of morphine  for pain. Ambulating within room. No acute events overnight.  Problem: Education: Goal: Knowledge of General Education information will improve Description: Including pain rating scale, medication(s)/side effects and non-pharmacologic comfort measures Outcome: Progressing   Problem: Clinical Measurements: Goal: Ability to maintain clinical measurements within normal limits will improve Outcome: Progressing Goal: Will remain free from infection Outcome: Progressing   Problem: Pain Managment: Goal: General experience of comfort will improve and/or be controlled Outcome: Progressing   Problem: Safety: Goal: Ability to remain free from injury will improve Outcome: Progressing   Problem: Skin Integrity: Goal: Risk for impaired skin integrity will decrease Outcome: Progressing

## 2024-02-04 NOTE — Progress Notes (Signed)
 PROGRESS NOTE    Pedro Owens  ZOX:[id number] DOB: 01/09/1991 DOA: 01/31/2024 PCP: Pcp, No  Brief Narrative:33 year old male with PMH recurrent cellulitis HIV, HCV, syphilis s/p treatment, septic PE not on AC, HTN, bipolar d/co, PNES who presented with rectal pain.  He reports that he developed pruritus and began scratching around his rectum.  After a few days he began developing perirectal pain and swelling on the right buttock.  He denies any drainage from the area nor any fevers at home. In in, he had also complained of developing a lesion on his left scalp that had very mildly blood at some point as well. There was concern for nonadherence to his Biktarvy  however states he is usually compliant but missed approximately 2 weeks of treatment recently.   CT abdomen/pelvis 4/14-was obtained which showed asymmetric fullness in the perianal region/perineum with ill-defined hypoattenuating area and moderate surrounding fat stranding at the area of concern.  There were phlegmonous changes but no obvious abscess as of yet. He was started on vancomycin  and Zosyn .  Assessment & Plan:   Principal Problem:   Perirectal cellulitis Active Problems:   Sepsis (HCC)   Positive blood culture   HIV (human immunodeficiency virus infection) (HCC)   Furuncle of scalp   Chronic hepatitis C without hepatic coma (HCC)   Group A streptococcal infection  Sepsis on admission presented with fever tachycardia leukocytosis and perirectal cellulitis  Sepsis POA   Perirectal cellulitis/bacteremia staph and Streptococcus mitis-with 4 cm area of induration and CT evidence of the same WBCs improving on vancomycin  and Zosyn  Repeat CT 02/03/2024 -Decreased but persistent subcutaneous soft tissue thickening along the right medial gluteal cleft with increased asymmetric thickening of the right levator ani. No discrete fluid collection. Short segment mural thickening of the ascending colon, which may be related to  peristalsis or colitis.Moderate volume stool throughout the colon. Recommend correlation with constipation. Appreciate ID consult   Furuncle of scalp - Left scalp noted with very small vertical with overlying scab; still no fluctuance or warmth -Continue antibiotics as noted -CT head unremarkable   HIV (human immunodeficiency virus infection) (HCC) - Mostly compliant but endorses missing approximately 2 weeks of treatment recently -Continue Biktarvy  -CD4 346 down from 834 in January 2025   Group A streptococcal infection - Recent positive group A strep test on 01/25/2024 -Has been on 10-day course of amoxicillin  outpatient; currently covered while on Zosyn    - Constipation will order stool softeners and laxatives to keep this loose stool not hard  Estimated body mass index is 25.03 kg/m as calculated from the following:   Height as of this encounter: 5' 11.5" (1.816 m).   Weight as of this encounter: 82.6 kg.  DVT prophylaxis: scd Code Status: full Family Communication: none Disposition Plan:  Status is: Inpatient Remains inpatient appropriate because: vanco zosyn    Consultants: ID consulted  Procedures: None Antimicrobials: Vanco and Zosyn   Subjective:  This complain of constipation he is afraid to have a bowel movement due to pain in the rectal area  Objective: Vitals:   02/03/24 1726 02/03/24 1901 02/03/24 2122 02/04/24 0531  BP: 113/68 112/72  115/72  Pulse: 63 60    Resp: 16 18  18   Temp: 97.8 F (36.6 C) 98.3 F (36.8 C) 98 F (36.7 C) 97.8 F (36.6 C)  TempSrc:   Oral   SpO2: 100% 99%  95%  Weight:      Height:        Intake/Output Summary (Last 24  hours) at 02/04/2024 1105 Last data filed at 02/03/2024 1400 Gross per 24 hour  Intake --  Output 400 ml  Net -400 ml   Filed Weights   01/31/24 0516  Weight: 82.6 kg    Examination:  General exam: Appears chronically ill-appearing Respiratory system: Clear to auscultation. Respiratory effort  normal. Cardiovascular system: Regular Gastrointestinal system: Soft  Central nervous system: Alert and oriented. No focal neurological deficits. Extremities: No edema   Data Reviewed: I have personally reviewed following labs and imaging studies  CBC: Recent Labs  Lab 01/31/24 1112 02/01/24 0522 02/04/24 1041  WBC 24.5* 16.8* 11.0*  NEUTROABS 19.3*  --   --   HGB 15.6 15.7 15.4  HCT 47.3 47.1 46.1  MCV 88.2 89.5 87.1  PLT 329 280 375   Basic Metabolic Panel: Recent Labs  Lab 01/31/24 1112 02/01/24 0522  NA 134* 133*  K 4.4 4.1  CL 99 103  CO2 23 21*  GLUCOSE 101* 101*  BUN 16 21*  CREATININE 0.91 0.97  CALCIUM 8.6* 8.3*  MG  --  2.0  PHOS  --  2.7   GFR: Estimated Creatinine Clearance: 119.4 mL/min (by C-G formula based on SCr of 0.97 mg/dL). Liver Function Tests: Recent Labs  Lab 01/31/24 1112  AST 76*  ALT 76*  ALKPHOS 61  BILITOT 2.1*  PROT 7.9  ALBUMIN 3.4*   No results for input(s): "LIPASE", "AMYLASE" in the last 168 hours. No results for input(s): "AMMONIA" in the last 168 hours. Coagulation Profile: No results for input(s): "INR", "PROTIME" in the last 168 hours. Cardiac Enzymes: No results for input(s): "CKTOTAL", "CKMB", "CKMBINDEX", "TROPONINI" in the last 168 hours. BNP (last 3 results) No results for input(s): "PROBNP" in the last 8760 hours. HbA1C: No results for input(s): "HGBA1C" in the last 72 hours. CBG: No results for input(s): "GLUCAP" in the last 168 hours. Lipid Profile: No results for input(s): "CHOL", "HDL", "LDLCALC", "TRIG", "CHOLHDL", "LDLDIRECT" in the last 72 hours. Thyroid Function Tests: No results for input(s): "TSH", "T4TOTAL", "FREET4", "T3FREE", "THYROIDAB" in the last 72 hours. Anemia Panel: No results for input(s): "VITAMINB12", "FOLATE", "FERRITIN", "TIBC", "IRON", "RETICCTPCT" in the last 72 hours. Sepsis Labs: Recent Labs  Lab 01/31/24 1110  LATICACIDVEN 1.6    Recent Results (from the past 240 hours)   Blood culture (routine x 2)     Status: Abnormal   Collection Time: 01/31/24 11:12 AM   Specimen: BLOOD LEFT ARM  Result Value Ref Range Status   Specimen Description   Final    BLOOD LEFT ARM Performed at [facility], [address] [place], [place] [address]    Special Requests   Final    BOTTLES DRAWN AEROBIC AND ANAEROBIC Blood Culture adequate volume Performed at [facility], [address], [place], [place] [address]    Culture  Setup Time   Final    GRAM POSITIVE COCCI IN CHAINS ANAEROBIC BOTTLE ONLY Organism ID to follow CRITICAL RESULT CALLED TO, READ BACK BY AND VERIFIED WITH: T GREEN,PHARMD@[id number]  02/01/24 MK Performed at [facility], [address] [place], [place] [address]    Culture STREPTOCOCCUS MITIS/ORALIS (A)  Final   Report Status 02/03/2024 FINAL  Final   Organism ID, Bacteria STREPTOCOCCUS MITIS/ORALIS  Final      Susceptibility   Streptococcus mitis/oralis - MIC*    PENICILLIN  0.25 INTERMEDIATE Intermediate     CEFTRIAXONE  <=0.12 SENSITIVE Sensitive     LEVOFLOXACIN  1 SENSITIVE Sensitive  VANCOMYCIN  0.5 SENSITIVE Sensitive     * STREPTOCOCCUS MITIS/ORALIS  Blood culture (routine x 2)     Status: None (Preliminary result)   Collection Time: 01/31/24 11:12 AM   Specimen: BLOOD LEFT ARM  Result Value Ref Range Status   Specimen Description   Final    BLOOD LEFT ARM Performed at [facility], [address] [place], [place] [address]    Special Requests   Final    BOTTLES DRAWN AEROBIC AND ANAEROBIC Blood Culture results may not be optimal due to an inadequate volume of blood received in culture bottles Performed at [facility], [address] [place], [place] [address]    Culture   Final    NO GROWTH 4 DAYS Performed at [facility], [address] [place], [place] [address]    Report Status PENDING  Incomplete  Blood Culture ID Panel (Reflexed)     Status: Abnormal    Collection Time: 01/31/24 11:12 AM  Result Value Ref Range Status   Enterococcus faecalis NOT DETECTED NOT DETECTED Final   Enterococcus Faecium NOT DETECTED NOT DETECTED Final   Listeria monocytogenes NOT DETECTED NOT DETECTED Final   Staphylococcus species DETECTED (A) NOT DETECTED Final    Comment: CRITICAL RESULT CALLED TO, READ BACK BY AND VERIFIED WITH: T GREEN,PHARMD@[id number]  02/01/24 MK    Staphylococcus aureus (BCID) NOT DETECTED NOT DETECTED Final   Staphylococcus epidermidis DETECTED (A) NOT DETECTED Final    Comment: CRITICAL RESULT CALLED TO, READ BACK BY AND VERIFIED WITH: T GREEN,PHARMD@[id number]  02/01/24 MK    Staphylococcus lugdunensis NOT DETECTED NOT DETECTED Final   Streptococcus species DETECTED (A) NOT DETECTED Final    Comment: Not Enterococcus species, Streptococcus agalactiae, Streptococcus pyogenes, or Streptococcus pneumoniae. CRITICAL RESULT CALLED TO, READ BACK BY AND VERIFIED WITH: T GREEN,PHARMD@[id number]  02/01/24 MK    Streptococcus agalactiae NOT DETECTED NOT DETECTED Final   Streptococcus pneumoniae NOT DETECTED NOT DETECTED Final   Streptococcus pyogenes NOT DETECTED NOT DETECTED Final   A.calcoaceticus-baumannii NOT DETECTED NOT DETECTED Final   Bacteroides fragilis NOT DETECTED NOT DETECTED Final   Enterobacterales NOT DETECTED NOT DETECTED Final   Enterobacter cloacae complex NOT DETECTED NOT DETECTED Final   Escherichia coli NOT DETECTED NOT DETECTED Final   Klebsiella aerogenes NOT DETECTED NOT DETECTED Final   Klebsiella oxytoca NOT DETECTED NOT DETECTED Final   Klebsiella pneumoniae NOT DETECTED NOT DETECTED Final   Proteus species NOT DETECTED NOT DETECTED Final   Salmonella species NOT DETECTED NOT DETECTED Final   Serratia marcescens NOT DETECTED NOT DETECTED Final   Haemophilus influenzae NOT DETECTED NOT DETECTED Final   Neisseria meningitidis NOT DETECTED NOT DETECTED Final   Pseudomonas aeruginosa NOT DETECTED NOT DETECTED Final    Stenotrophomonas maltophilia NOT DETECTED NOT DETECTED Final   Candida albicans NOT DETECTED NOT DETECTED Final   Candida auris NOT DETECTED NOT DETECTED Final   Candida glabrata NOT DETECTED NOT DETECTED Final   Candida krusei NOT DETECTED NOT DETECTED Final   Candida parapsilosis NOT DETECTED NOT DETECTED Final   Candida tropicalis NOT DETECTED NOT DETECTED Final   Cryptococcus neoformans/gattii NOT DETECTED NOT DETECTED Final   Methicillin resistance mecA/C NOT DETECTED NOT DETECTED Final    Comment: Performed at [facility], [address], [place], [place] [address]  Culture, blood (Routine X 2) w Reflex to ID Panel     Status: None (Preliminary result)   Collection Time: 02/02/24  7:39 PM   Specimen:  BLOOD RIGHT HAND  Result Value Ref Range Status   Specimen Description   Final    BLOOD RIGHT HAND Performed at [facility], [address] [place], [place] [address]    Special Requests   Final    BOTTLES DRAWN AEROBIC AND ANAEROBIC Blood Culture adequate volume Performed at [facility], [address] [place], [place] [address]    Culture   Final    NO GROWTH 2 DAYS Performed at [facility], [address] [place], [place] [address]    Report Status PENDING  Incomplete  Culture, blood (Routine X 2) w Reflex to ID Panel     Status: None (Preliminary result)   Collection Time: 02/02/24  7:44 PM   Specimen: BLOOD RIGHT HAND  Result Value Ref Range Status   Specimen Description   Final    BLOOD RIGHT HAND Performed at [facility], [address] [place], [place] [address]    Special Requests   Final    BOTTLES DRAWN AEROBIC AND ANAEROBIC Blood Culture adequate volume Performed at [facility], [address] [place], [place] [address]    Culture  Setup Time   Final    GRAM POSITIVE COCCI IN CLUSTERS ANAEROBIC BOTTLE ONLY CRITICAL VALUE NOTED.  VALUE IS CONSISTENT WITH PREVIOUSLY REPORTED AND CALLED  VALUE. Performed at [facility], [address], [place], [place] [address]    Culture GRAM POSITIVE COCCI IN CLUSTERS  Final   Report Status PENDING  Incomplete         Radiology Studies: CT ABDOMEN PELVIS W CONTRAST Result Date: 02/03/2024 CLINICAL DATA:  Perirectal cellulitis EXAM: CT ABDOMEN AND PELVIS WITH CONTRAST TECHNIQUE: Multidetector CT imaging of the abdomen and pelvis was performed using the standard protocol following bolus administration of intravenous contrast. RADIATION DOSE REDUCTION: This exam was performed according to the departmental dose-optimization program which includes automated exposure control, adjustment of the mA and/or kV according to patient size and/or use of iterative reconstruction technique. CONTRAST:  OMNIPAQUE  IOHEXOL  300 MG/ML  SOLN COMPARISON:  CT abdomen and pelvis dated 01/31/2024 FINDINGS: Lower chest: No focal consolidation or pulmonary nodule in the lung bases. No pleural effusion or pneumothorax demonstrated. Partially imaged heart size is normal. Hepatobiliary: No focal hepatic lesions. No intra or extrahepatic biliary ductal dilation. Normal gallbladder. Pancreas: No focal lesions or main ductal dilation. Spleen: Normal in size without focal abnormality. Adrenals/Urinary Tract: No adrenal nodules. No suspicious renal mass, calculi or hydronephrosis. No focal bladder wall thickening. Stomach/Bowel: Normal appearance of the stomach. Short segment mural thickening of the ascending colon. No abnormal bowel dilation. Moderate volume stool throughout the colon. Normal appendix. Vascular/Lymphatic: No significant vascular findings are present. No enlarged abdominal or pelvic lymph nodes. Reproductive: Prostate is unremarkable. Other: Trace right hemi abdominal free fluid. No free air or fluid collection. Musculoskeletal: No acute or abnormal lytic or blastic osseous lesions. Increased asymmetric thickening of the right levator ani, which  demonstrates hyperenhancement. Persistent subcutaneous soft tissue thickening along the right medial gluteal cleft. The degree of adjacent subcutaneous soft tissue stranding is decreased. No discrete fluid collection. IMPRESSION: 1. Decreased but persistent subcutaneous soft tissue thickening along the right medial gluteal cleft with increased asymmetric thickening of the right levator ani. No discrete fluid collection. 2. Short segment mural thickening of the ascending colon, which may be related to peristalsis or colitis. 3. Moderate volume stool throughout the colon. Recommend correlation with constipation.  Electronically Signed   By: Limin  Xu M.D.   On: 02/03/2024 18:01     Scheduled Meds:  bictegravir-emtricitabine -tenofovir  AF  1 tablet Oral Daily   lurasidone   40 mg Oral Q breakfast   sodium chloride  flush  3 mL Intravenous Q12H   Continuous Infusions:  ampicillin -sulbactam (UNASYN ) IV     linezolid  (ZYVOX ) IV       LOS: 4 days    Time spent: 38 min  Barbee Lew, MD 02/04/2024, 11:05 AM

## 2024-02-04 NOTE — Progress Notes (Signed)
 Echocardiogram 2D Echocardiogram has been performed.  Pedro Owens 02/04/2024, 10:25 AM

## 2024-02-05 DIAGNOSIS — K611 Rectal abscess: Secondary | ICD-10-CM | POA: Diagnosis not present

## 2024-02-05 LAB — COMPREHENSIVE METABOLIC PANEL WITH GFR
ALT: 76 U/L — ABNORMAL HIGH (ref 0–44)
AST: 64 U/L — ABNORMAL HIGH (ref 15–41)
Albumin: 2.6 g/dL — ABNORMAL LOW (ref 3.5–5.0)
Alkaline Phosphatase: 63 U/L (ref 38–126)
Anion gap: 6 (ref 5–15)
BUN: 15 mg/dL (ref 6–20)
CO2: 27 mmol/L (ref 22–32)
Calcium: 8.4 mg/dL — ABNORMAL LOW (ref 8.9–10.3)
Chloride: 104 mmol/L (ref 98–111)
Creatinine, Ser: 1.02 mg/dL (ref 0.61–1.24)
GFR, Estimated: >60 mL/min (ref >60)
Glucose, Bld: 145 mg/dL — ABNORMAL HIGH (ref 70–99)
Potassium: 4.3 mmol/L (ref 3.5–5.1)
Sodium: 137 mmol/L (ref 135–145)
Total Bilirubin: 0.4 mg/dL (ref 0.0–1.2)
Total Protein: 7 g/dL (ref 6.5–8.1)

## 2024-02-05 LAB — HIV-1 RNA QUANT-NO REFLEX-BLD
HIV 1 RNA Quant: 1880 {copies}/mL
LOG10 HIV-1 RNA: 3.274 {Log_copies}/mL

## 2024-02-05 LAB — CULTURE, BLOOD (ROUTINE X 2): Culture: NO GROWTH

## 2024-02-05 NOTE — Plan of Care (Signed)

## 2024-02-05 NOTE — Progress Notes (Signed)
 PROGRESS NOTE    Pedro Owens  YQM:[id number] DOB: 12-Jun-1991 DOA: 01/31/2024 PCP: Pcp, No  Brief Narrative:33 year old male with PMH recurrent cellulitis HIV, HCV, syphilis s/p treatment, septic PE not on AC, HTN, bipolar d/co, PNES who presented with rectal pain.  He reports that he developed pruritus and began scratching around his rectum.  After a few days he began developing perirectal pain and swelling on the right buttock.  He denies any drainage from the area nor any fevers at home. In in, he had also complained of developing a lesion on his left scalp that had very mildly blood at some point as well. There was concern for nonadherence to his Biktarvy  however states he is usually compliant but missed approximately 2 weeks of treatment recently.   CT abdomen/pelvis 4/14-was obtained which showed asymmetric fullness in the perianal region/perineum with ill-defined hypoattenuating area and moderate surrounding fat stranding at the area of concern.  There were phlegmonous changes but no obvious abscess as of yet. He was started on vancomycin  and Zosyn .  Assessment & Plan:   Principal Problem:   Perirectal cellulitis Active Problems:   Sepsis (HCC)   Positive blood culture   HIV (human immunodeficiency virus infection) (HCC)   Furuncle of scalp   Chronic hepatitis C without hepatic coma (HCC)   Group A streptococcal infection  Sepsis on admission presented with fever tachycardia leukocytosis and perirectal cellulitis  Sepsis POA   Perirectal cellulitis/bacteremia staph and Streptococcus mitis-with 4 cm area of induration and CT evidence of the same WBCs improving on vancomycin  and Zosyn  Repeat CT 02/03/2024 -Decreased but persistent subcutaneous soft tissue thickening along the right medial gluteal cleft with increased asymmetric thickening of the right levator ani. No discrete fluid collection. Short segment mural thickening of the ascending colon, which may be related to  peristalsis or colitis.Moderate volume stool throughout the colon. Recommend correlation with constipation. Appreciate ID consult changed antibiotics to Zyvox  and Unasyn  from Vanco and Zosyn .  If continues to improve over the weekend could switch to Doxy and Augmentin  to complete 2-week course end of treatment 02/14/2024.  Follow-up with Dr. Artemio Larry on May 9 th   Furuncle of scalp - Left scalp noted with very small vertical with overlying scab; still no fluctuance or warmth -Continue antibiotics as noted -CT head unremarkable   HIV (human immunodeficiency virus infection) (HCC) - Mostly compliant but endorses missing approximately 2 weeks of treatment recently -Continue Biktarvy  -CD4 346 down from 834 in January 2025   Group A streptococcal infection - Recent positive group A strep test on 01/25/2024 -Has been on 10-day course of amoxicillin  outpatient; currently covered while on Zosyn    - Constipation will order stool softeners and laxatives to keep this loose stool not hard  Estimated body mass index is 25.03 kg/m as calculated from the following:   Height as of this encounter: 5' 11.5" (1.816 m).   Weight as of this encounter: 82.6 kg.  DVT prophylaxis: scd Code Status: full Family Communication: none Disposition Plan:  Status is: Inpatient Remains inpatient appropriate because: vanco zosyn    Consultants: ID consulted  Procedures: None Antimicrobials: Vanco and Zosyn   Subjective:had BMS no other new c/o  Objective: Vitals:   02/04/24 1157 02/04/24 1952 02/05/24 0532 02/05/24 1239  BP: 120/66 115/72 116/82 105/60  Pulse: 63 (!) 57 66 (!) 48  Resp: 17 18 20 18   Temp: 98.1 F (36.7 C) 97.9 F (36.6 C) 97.8 F (36.6 C) (!) 97.5 F (36.4 C)  TempSrc:  Oral Oral    SpO2: 98% 99% 97% 100%  Weight:      Height:        Intake/Output Summary (Last 24 hours) at 02/05/2024 1254 Last data filed at 02/05/2024 0419 Gross per 24 hour  Intake 1650.92 ml  Output 375 ml  Net  1275.92 ml   Filed Weights   01/31/24 0516  Weight: 82.6 kg    Examination:  General exam: Appears chronically ill-appearing Respiratory system: Clear to auscultation. Respiratory effort normal. Cardiovascular system: Regular Gastrointestinal system: Soft  Central nervous system: Alert and oriented. No focal neurological deficits. Extremities: No edema   Data Reviewed: I have personally reviewed following labs and imaging studies  CBC: Recent Labs  Lab 01/31/24 1112 02/01/24 0522 02/04/24 1041  WBC 24.5* 16.8* 11.0*  NEUTROABS 19.3*  --   --   HGB 15.6 15.7 15.4  HCT 47.3 47.1 46.1  MCV 88.2 89.5 87.1  PLT 329 280 375   Basic Metabolic Panel: Recent Labs  Lab 01/31/24 1112 02/01/24 0522 02/05/24 0702  NA 134* 133* 137  K 4.4 4.1 4.3  CL 99 103 104  CO2 23 21* 27  GLUCOSE 101* 101* 145*  BUN 16 21* 15  CREATININE 0.91 0.97 1.02  CALCIUM 8.6* 8.3* 8.4*  MG  --  2.0  --   PHOS  --  2.7  --    GFR: Estimated Creatinine Clearance: 113.5 mL/min (by C-G formula based on SCr of 1.02 mg/dL). Liver Function Tests: Recent Labs  Lab 01/31/24 1112 02/05/24 0702  AST 76* 64*  ALT 76* 76*  ALKPHOS 61 63  BILITOT 2.1* 0.4  PROT 7.9 7.0  ALBUMIN 3.4* 2.6*   No results for input(s): "LIPASE", "AMYLASE" in the last 168 hours. No results for input(s): "AMMONIA" in the last 168 hours. Coagulation Profile: No results for input(s): "INR", "PROTIME" in the last 168 hours. Cardiac Enzymes: No results for input(s): "CKTOTAL", "CKMB", "CKMBINDEX", "TROPONINI" in the last 168 hours. BNP (last 3 results) No results for input(s): "PROBNP" in the last 8760 hours. HbA1C: No results for input(s): "HGBA1C" in the last 72 hours. CBG: No results for input(s): "GLUCAP" in the last 168 hours. Lipid Profile: No results for input(s): "CHOL", "HDL", "LDLCALC", "TRIG", "CHOLHDL", "LDLDIRECT" in the last 72 hours. Thyroid Function Tests: No results for input(s): "TSH", "T4TOTAL",  "FREET4", "T3FREE", "THYROIDAB" in the last 72 hours. Anemia Panel: No results for input(s): "VITAMINB12", "FOLATE", "FERRITIN", "TIBC", "IRON", "RETICCTPCT" in the last 72 hours. Sepsis Labs: Recent Labs  Lab 01/31/24 1110  LATICACIDVEN 1.6    Recent Results (from the past 240 hours)  Blood culture (routine x 2)     Status: Abnormal   Collection Time: 01/31/24 11:12 AM   Specimen: BLOOD LEFT ARM  Result Value Ref Range Status   Specimen Description   Final    BLOOD LEFT ARM Performed at [facility], [address] [place], [place] [address]    Special Requests   Final    BOTTLES DRAWN AEROBIC AND ANAEROBIC Blood Culture adequate volume Performed at [facility], [address], [place], [place] [address]    Culture  Setup Time   Final    GRAM POSITIVE COCCI IN CHAINS ANAEROBIC BOTTLE ONLY Organism ID to follow CRITICAL RESULT CALLED TO, READ BACK BY AND VERIFIED WITH: T GREEN,PHARMD@[id number]  02/01/24 MK Performed at [facility], [address], [place], [place] [address]    Culture STREPTOCOCCUS MITIS/ORALIS (A)  Final   Report Status 02/03/2024 FINAL  Final   Organism ID, Bacteria STREPTOCOCCUS MITIS/ORALIS  Final      Susceptibility   Streptococcus mitis/oralis - MIC*    PENICILLIN  0.25 INTERMEDIATE Intermediate     CEFTRIAXONE  <=0.12 SENSITIVE Sensitive     LEVOFLOXACIN  1 SENSITIVE Sensitive     VANCOMYCIN  0.5 SENSITIVE Sensitive     * STREPTOCOCCUS MITIS/ORALIS  Blood culture (routine x 2)     Status: None   Collection Time: 01/31/24 11:12 AM   Specimen: BLOOD LEFT ARM  Result Value Ref Range Status   Specimen Description   Final    BLOOD LEFT ARM Performed at [facility], [address], [place], [place] [address]    Special Requests   Final    BOTTLES DRAWN AEROBIC AND ANAEROBIC Blood Culture results may not be optimal due to an inadequate volume of blood received in culture bottles Performed at [facility], [address] [place], [place] [address]    Culture   Final    NO GROWTH 5 DAYS Performed at [facility], [address], [place], [place] [address]    Report Status 02/05/2024 FINAL  Final  Blood Culture ID Panel (Reflexed)     Status: Abnormal   Collection Time: 01/31/24 11:12 AM  Result Value Ref Range Status   Enterococcus faecalis NOT DETECTED NOT DETECTED Final   Enterococcus Faecium NOT DETECTED NOT DETECTED Final   Listeria monocytogenes NOT DETECTED NOT DETECTED Final   Staphylococcus species DETECTED (A) NOT DETECTED Final    Comment: CRITICAL RESULT CALLED TO, READ BACK BY AND VERIFIED WITH: T GREEN,PHARMD@[id number]  02/01/24 MK    Staphylococcus aureus (BCID) NOT DETECTED NOT DETECTED Final   Staphylococcus epidermidis DETECTED (A) NOT DETECTED Final    Comment: CRITICAL RESULT CALLED TO, READ BACK BY AND VERIFIED WITH: T GREEN,PHARMD@[id number]  02/01/24 MK    Staphylococcus lugdunensis NOT DETECTED NOT DETECTED Final   Streptococcus species DETECTED (A) NOT DETECTED Final    Comment: Not Enterococcus species, Streptococcus agalactiae, Streptococcus pyogenes, or Streptococcus pneumoniae. CRITICAL RESULT CALLED TO, READ BACK BY AND VERIFIED WITH: T GREEN,PHARMD@[id number]  02/01/24 MK    Streptococcus agalactiae NOT DETECTED NOT DETECTED Final   Streptococcus pneumoniae NOT DETECTED NOT DETECTED Final   Streptococcus pyogenes NOT DETECTED NOT DETECTED Final   A.calcoaceticus-baumannii NOT DETECTED NOT DETECTED Final   Bacteroides fragilis NOT DETECTED NOT DETECTED Final   Enterobacterales NOT DETECTED NOT DETECTED Final   Enterobacter cloacae complex NOT DETECTED NOT DETECTED Final   Escherichia coli NOT DETECTED NOT DETECTED Final   Klebsiella aerogenes NOT DETECTED NOT DETECTED Final   Klebsiella oxytoca NOT DETECTED NOT DETECTED Final   Klebsiella pneumoniae NOT DETECTED NOT DETECTED Final   Proteus species NOT DETECTED NOT DETECTED Final   Salmonella species  NOT DETECTED NOT DETECTED Final   Serratia marcescens NOT DETECTED NOT DETECTED Final   Haemophilus influenzae NOT DETECTED NOT DETECTED Final   Neisseria meningitidis NOT DETECTED NOT DETECTED Final   Pseudomonas aeruginosa NOT DETECTED NOT DETECTED Final   Stenotrophomonas maltophilia NOT DETECTED NOT DETECTED Final   Candida albicans NOT DETECTED NOT DETECTED Final   Candida auris NOT DETECTED NOT DETECTED Final   Candida glabrata NOT DETECTED NOT DETECTED Final   Candida krusei NOT DETECTED NOT DETECTED Final   Candida parapsilosis NOT DETECTED NOT DETECTED Final   Candida tropicalis NOT DETECTED NOT DETECTED Final   Cryptococcus neoformans/gattii NOT DETECTED NOT DETECTED Final  Methicillin resistance mecA/C NOT DETECTED NOT DETECTED Final    Comment: Performed at [facility], [address], [place], [place] [address]  Culture, blood (Routine X 2) w Reflex to ID Panel     Status: None (Preliminary result)   Collection Time: 02/02/24  7:39 PM   Specimen: BLOOD RIGHT HAND  Result Value Ref Range Status   Specimen Description   Final    BLOOD RIGHT HAND Performed at [facility], [address], [place], [place] [address]    Special Requests   Final    BOTTLES DRAWN AEROBIC AND ANAEROBIC Blood Culture adequate volume Performed at [facility], [address], [place], [place] [address]    Culture   Final    NO GROWTH 3 DAYS Performed at [facility], [address] [place], [place] [address]    Report Status PENDING  Incomplete  Culture, blood (Routine X 2) w Reflex to ID Panel     Status: Abnormal   Collection Time: 02/02/24  7:44 PM   Specimen: BLOOD RIGHT HAND  Result Value Ref Range Status   Specimen Description   Final    BLOOD RIGHT HAND Performed at [facility], [address], [place], [place] [address]    Special Requests   Final    BOTTLES DRAWN AEROBIC AND ANAEROBIC Blood Culture adequate volume Performed at [facility], [address], [place], [place] [address]    Culture  Setup Time   Final    GRAM POSITIVE COCCI IN CLUSTERS ANAEROBIC BOTTLE ONLY CRITICAL VALUE NOTED.  VALUE IS CONSISTENT WITH PREVIOUSLY REPORTED AND CALLED VALUE.    Culture (A)  Final    STAPHYLOCOCCUS HOMINIS THE SIGNIFICANCE OF ISOLATING THIS ORGANISM FROM A SINGLE SET OF BLOOD CULTURES WHEN MULTIPLE SETS ARE DRAWN IS UNCERTAIN. PLEASE NOTIFY THE MICROBIOLOGY DEPARTMENT WITHIN ONE WEEK IF SPECIATION AND SENSITIVITIES ARE REQUIRED. Performed at [facility], [address], [place], [place] [address]    Report Status 02/04/2024 FINAL  Final         Radiology Studies: ECHOCARDIOGRAM COMPLETE BUBBLE STUDY Result Date: 02/04/2024    ECHOCARDIOGRAM REPORT   Patient Name:   DECKLYN HYDER Aderman Date of Exam: 02/04/2024 Medical Rec #:  [id number]      Height:       71.5 in Accession #:    [id number]     Weight:       182.0 lb Date of Birth:  [birth year]      BSA:          2.036 m Patient Age:    32 years       BP:           115/72 mmHg Patient Gender: M              HR:           50 bpm. Exam Location:  Inpatient Procedure: 2D Echo, Cardiac Doppler, Color Doppler, Intracardiac Opacification            Agent and Saline Contrast Bubble Study (Both Spectral and Color Flow            Doppler were utilized during procedure). Indications:    Bacteremia  History:        Patient has no prior history of Echocardiogram examinations.                 Risk Factors:Current Smoker.  Sonographer:    Terrilee Few RCS Referring Phys: [id number] [facility] IMPRESSIONS  1. Left ventricular ejection fraction, by estimation, is 35 to 40%. The left ventricle has moderately decreased function. The left ventricle demonstrates global hypokinesis.  2. Right ventricular systolic function is severely reduced. The right ventricular size is normal.  3. The mitral valve is normal in structure. No evidence of mitral valve regurgitation.  4. The  aortic valve is tricuspid. Aortic valve regurgitation is not visualized.  5. The inferior vena cava is normal in size with greater than 50% respiratory variability, suggesting right atrial pressure of 3 mmHg.  6. Agitated saline contrast bubble study was negative, with no evidence of any interatrial shunt. FINDINGS  Left Ventricle: Left ventricular ejection fraction, by estimation, is 35 to 40%. The left ventricle has moderately decreased function. The left ventricle demonstrates global hypokinesis. Definity  contrast agent was given IV to delineate the left ventricular endocardial borders. The left ventricular internal cavity size was normal in size. There is no left ventricular hypertrophy. Right Ventricle: The right ventricular size is normal. Right vetricular wall thickness was not assessed. Right ventricular systolic function is severely reduced. Left Atrium: Left atrial size was normal in size. Right Atrium: Right atrial size was normal in size. Pericardium: There is no evidence of pericardial effusion. Mitral Valve: The mitral valve is normal in structure. No evidence of mitral valve regurgitation. Tricuspid Valve: The tricuspid valve is normal in structure. Tricuspid valve regurgitation is trivial. Aortic Valve: The aortic valve is tricuspid. Aortic valve regurgitation is not visualized. Aortic valve peak gradient measures 4.2 mmHg. Pulmonic Valve: The pulmonic valve was normal in structure. Pulmonic valve regurgitation is trivial. Aorta: The aortic root and ascending aorta are structurally normal, with no evidence of dilitation. Venous: The inferior vena cava is normal in size with greater than 50% respiratory variability, suggesting right atrial pressure of 3 mmHg. IAS/Shunts: No atrial level shunt detected by color flow Doppler. Agitated saline contrast was given intravenously to evaluate for intracardiac shunting. Agitated saline contrast bubble study was negative, with no evidence of any interatrial  shunt.  LEFT VENTRICLE PLAX 2D LVIDd:         4.80 cm   Diastology LVIDs:         4.10 cm   LV e' medial:    10.60 cm/s LV PW:         1.00 cm   LV E/e' medial:  6.0 LV IVS:        0.80 cm   LV e' lateral:   13.35 cm/s LVOT diam:     2.30 cm   LV E/e' lateral: 4.7 LV SV:         63 LV SV Index:   31 LVOT Area:     4.15 cm  RIGHT VENTRICLE             IVC RV S prime:     14.60 cm/s  IVC diam: 1.20 cm TAPSE (M-mode): 3.2 cm LEFT ATRIUM             Index        RIGHT ATRIUM           Index LA diam:        2.60 cm 1.28 cm/m   RA Area:     16.80 cm LA Vol (A2C):   33.8 ml 16.62 ml/m  RA Volume:   43.70 ml  21.46 ml/m LA Vol (A4C):   37.2 ml 18.27 ml/m LA  Biplane Vol: 39.3 ml 19.30 ml/m  AORTIC VALVE AV Area (Vmax): 3.15 cm AV Vmax:        102.00 cm/s AV Peak Grad:   4.2 mmHg LVOT Vmax:      77.42 cm/s LVOT Vmean:     46.500 cm/s LVOT VTI:       0.152 m  AORTA Ao Root diam: 3.30 cm Ao Asc diam:  2.70 cm MITRAL VALVE               TRICUSPID VALVE MV Area (PHT): 3.42 cm    TR Peak grad:   14.1 mmHg MV Decel Time: 222 msec    TR Vmax:        188.00 cm/s MV E velocity: 63.40 cm/s MV A velocity: 46.70 cm/s  SHUNTS MV E/A ratio:  1.36        Systemic VTI:  0.15 m                            Systemic Diam: 2.30 cm Ola Berger MD Electronically signed by Ola Berger MD Signature Date/Time: 02/04/2024/2:33:38 PM    Final    CT ABDOMEN PELVIS W CONTRAST Result Date: 02/03/2024 CLINICAL DATA:  Perirectal cellulitis EXAM: CT ABDOMEN AND PELVIS WITH CONTRAST TECHNIQUE: Multidetector CT imaging of the abdomen and pelvis was performed using the standard protocol following bolus administration of intravenous contrast. RADIATION DOSE REDUCTION: This exam was performed according to the departmental dose-optimization program which includes automated exposure control, adjustment of the mA and/or kV according to patient size and/or use of iterative reconstruction technique. CONTRAST:  OMNIPAQUE  IOHEXOL  300 MG/ML  SOLN  COMPARISON:  CT abdomen and pelvis dated 01/31/2024 FINDINGS: Lower chest: No focal consolidation or pulmonary nodule in the lung bases. No pleural effusion or pneumothorax demonstrated. Partially imaged heart size is normal. Hepatobiliary: No focal hepatic lesions. No intra or extrahepatic biliary ductal dilation. Normal gallbladder. Pancreas: No focal lesions or main ductal dilation. Spleen: Normal in size without focal abnormality. Adrenals/Urinary Tract: No adrenal nodules. No suspicious renal mass, calculi or hydronephrosis. No focal bladder wall thickening. Stomach/Bowel: Normal appearance of the stomach. Short segment mural thickening of the ascending colon. No abnormal bowel dilation. Moderate volume stool throughout the colon. Normal appendix. Vascular/Lymphatic: No significant vascular findings are present. No enlarged abdominal or pelvic lymph nodes. Reproductive: Prostate is unremarkable. Other: Trace right hemi abdominal free fluid. No free air or fluid collection. Musculoskeletal: No acute or abnormal lytic or blastic osseous lesions. Increased asymmetric thickening of the right levator ani, which demonstrates hyperenhancement. Persistent subcutaneous soft tissue thickening along the right medial gluteal cleft. The degree of adjacent subcutaneous soft tissue stranding is decreased. No discrete fluid collection. IMPRESSION: 1. Decreased but persistent subcutaneous soft tissue thickening along the right medial gluteal cleft with increased asymmetric thickening of the right levator ani. No discrete fluid collection. 2. Short segment mural thickening of the ascending colon, which may be related to peristalsis or colitis. 3. Moderate volume stool throughout the colon. Recommend correlation with constipation. Electronically Signed   By: Limin  Xu M.D.   On: 02/03/2024 18:01     Scheduled Meds:  bictegravir-emtricitabine -tenofovir  AF  1 tablet Oral Daily   lactulose   20 g Oral BID   lurasidone   40 mg  Oral Q breakfast   sodium chloride  flush  3 mL Intravenous Q12H   Continuous Infusions:  ampicillin -sulbactam (UNASYN ) IV 3 g (02/05/24 1128)   linezolid  (ZYVOX ) IV  600 mg (02/05/24 0836)     LOS: 5 days    Time spent: 38 min  Barbee Lew, MD 02/05/2024, 12:54 PM

## 2024-02-06 DIAGNOSIS — K611 Rectal abscess: Secondary | ICD-10-CM | POA: Diagnosis not present

## 2024-02-06 NOTE — Progress Notes (Signed)
 PROGRESS NOTE    Pedro Owens  BJY:[id number] DOB: 07/29/1991 DOA: 01/31/2024 PCP: Pcp, No  Brief Narrative:33 year old male with PMH recurrent cellulitis HIV, HCV, syphilis s/p treatment, septic PE not on AC, HTN, bipolar d/co, PNES who presented with rectal pain.  He reports that he developed pruritus and began scratching around his rectum.  After a few days he began developing perirectal pain and swelling on the right buttock.  He denies any drainage from the area nor any fevers at home. In in, he had also complained of developing a lesion on his left scalp that had very mildly blood at some point as well. There was concern for nonadherence to his Biktarvy  however states he is usually compliant but missed approximately 2 weeks of treatment recently.   CT abdomen/pelvis 4/14-was obtained which showed asymmetric fullness in the perianal region/perineum with ill-defined hypoattenuating area and moderate surrounding fat stranding at the area of concern.  There were phlegmonous changes but no obvious abscess as of yet. He was started on vancomycin  and Zosyn .  Assessment & Plan:   Principal Problem:   Perirectal cellulitis Active Problems:   Sepsis (HCC)   Positive blood culture   HIV (human immunodeficiency virus infection) (HCC)   Furuncle of scalp   Chronic hepatitis C without hepatic coma (HCC)   Group A streptococcal infection  Sepsis POA presented with fever tachycardia leukocytosis and perirectal cellulitis   Perirectal cellulitis/bacteremia staph and Streptococcus mitis-with 4 cm area of induration and CT evidence of the same WBCs improving on vancomycin  and Zosyn  Repeat CT 02/03/2024 -Decreased but persistent subcutaneous soft tissue thickening along the right medial gluteal cleft with increased asymmetric thickening of the right levator ani. No discrete fluid collection. Short segment mural thickening of the ascending colon, which may be related to peristalsis or  colitis.Moderate volume stool throughout the colon. Recommend correlation with constipation. Appreciate ID consult changed antibiotics to Zyvox  and Unasyn  from Vanco and Zosyn .  If continues to improve over the weekend could switch to Doxy and Augmentin  to complete 2-week course end of treatment 02/14/2024.  Follow-up with Dr. Artemio Larry on May 9 th   Furuncle of scalp - Left scalp noted with very small vertical with overlying scab; still no fluctuance or warmth -Continue antibiotics as noted -CT head unremarkable   HIV (human immunodeficiency virus infection) (HCC) - Mostly compliant but endorses missing approximately 2 weeks of treatment recently -Continue Biktarvy  -CD4 346 down from 834 in January 2025   Group A streptococcal infection - Recent positive group A strep test on 01/25/2024 -Has been on 10-day course of amoxicillin  outpatient; currently covered while on Zosyn    - Constipation will order stool softeners and laxatives to keep this loose stool not hard  Estimated body mass index is 25.03 kg/m as calculated from the following:   Height as of this encounter: 5' 11.5" (1.816 m).   Weight as of this encounter: 82.6 kg.  DVT prophylaxis: scd Code Status: full Family Communication: none Disposition Plan:  Status is: Inpatient Remains inpatient appropriate because: vanco zosyn    Consultants: ID consulted  Procedures: None Antimicrobials: Vanco and Zosyn   Subjective: NO new c/o pain better  Objective: Vitals:   02/05/24 1239 02/05/24 1240 02/05/24 1950 02/06/24 0539  BP: 105/60  108/75 116/71  Pulse: (!) 48 70 (!) 56 (!) 49  Resp: 18  20 20   Temp: (!) 97.5 F (36.4 C)  98.3 F (36.8 C) 97.8 F (36.6 C)  TempSrc:      SpO2:  100%  100% 99%  Weight:      Height:        Intake/Output Summary (Last 24 hours) at 02/06/2024 1137 Last data filed at 02/06/2024 1051 Gross per 24 hour  Intake 440 ml  Output 350 ml  Net 90 ml   Filed Weights   01/31/24 0516  Weight:  82.6 kg    Examination:  General exam: Appears chronically ill-appearing Respiratory system: Clear to auscultation. Respiratory effort normal. Cardiovascular system: Regular Gastrointestinal system: Soft  Central nervous system: Alert and oriented. No focal neurological deficits. Extremities: No edema   Data Reviewed: I have personally reviewed following labs and imaging studies  CBC: Recent Labs  Lab 01/31/24 1112 02/01/24 0522 02/04/24 1041  WBC 24.5* 16.8* 11.0*  NEUTROABS 19.3*  --   --   HGB 15.6 15.7 15.4  HCT 47.3 47.1 46.1  MCV 88.2 89.5 87.1  PLT 329 280 375   Basic Metabolic Panel: Recent Labs  Lab 01/31/24 1112 02/01/24 0522 02/05/24 0702  NA 134* 133* 137  K 4.4 4.1 4.3  CL 99 103 104  CO2 23 21* 27  GLUCOSE 101* 101* 145*  BUN 16 21* 15  CREATININE 0.91 0.97 1.02  CALCIUM 8.6* 8.3* 8.4*  MG  --  2.0  --   PHOS  --  2.7  --    GFR: Estimated Creatinine Clearance: 113.5 mL/min (by C-G formula based on SCr of 1.02 mg/dL). Liver Function Tests: Recent Labs  Lab 01/31/24 1112 02/05/24 0702  AST 76* 64*  ALT 76* 76*  ALKPHOS 61 63  BILITOT 2.1* 0.4  PROT 7.9 7.0  ALBUMIN 3.4* 2.6*   No results for input(s): "LIPASE", "AMYLASE" in the last 168 hours. No results for input(s): "AMMONIA" in the last 168 hours. Coagulation Profile: No results for input(s): "INR", "PROTIME" in the last 168 hours. Cardiac Enzymes: No results for input(s): "CKTOTAL", "CKMB", "CKMBINDEX", "TROPONINI" in the last 168 hours. BNP (last 3 results) No results for input(s): "PROBNP" in the last 8760 hours. HbA1C: No results for input(s): "HGBA1C" in the last 72 hours. CBG: No results for input(s): "GLUCAP" in the last 168 hours. Lipid Profile: No results for input(s): "CHOL", "HDL", "LDLCALC", "TRIG", "CHOLHDL", "LDLDIRECT" in the last 72 hours. Thyroid Function Tests: No results for input(s): "TSH", "T4TOTAL", "FREET4", "T3FREE", "THYROIDAB" in the last 72  hours. Anemia Panel: No results for input(s): "VITAMINB12", "FOLATE", "FERRITIN", "TIBC", "IRON", "RETICCTPCT" in the last 72 hours. Sepsis Labs: Recent Labs  Lab 01/31/24 1110  LATICACIDVEN 1.6    Recent Results (from the past 240 hours)  Blood culture (routine x 2)     Status: Abnormal   Collection Time: 01/31/24 11:12 AM   Specimen: BLOOD LEFT ARM  Result Value Ref Range Status   Specimen Description   Final    BLOOD LEFT ARM Performed at [facility], [address] [place], [place] [address]    Special Requests   Final    BOTTLES DRAWN AEROBIC AND ANAEROBIC Blood Culture adequate volume Performed at [facility], [address] [place], [place] [address]    Culture  Setup Time   Final    GRAM POSITIVE COCCI IN CHAINS ANAEROBIC BOTTLE ONLY Organism ID to follow CRITICAL RESULT CALLED TO, READ BACK BY AND VERIFIED WITH: T GREEN,PHARMD@[id number]  02/01/24 MK Performed at [facility], [address] [place], [place] [address]    Culture STREPTOCOCCUS MITIS/ORALIS (A)  Final   Report Status 02/03/2024  FINAL  Final   Organism ID, Bacteria STREPTOCOCCUS MITIS/ORALIS  Final      Susceptibility   Streptococcus mitis/oralis - MIC*    PENICILLIN  0.25 INTERMEDIATE Intermediate     CEFTRIAXONE  <=0.12 SENSITIVE Sensitive     LEVOFLOXACIN  1 SENSITIVE Sensitive     VANCOMYCIN  0.5 SENSITIVE Sensitive     * STREPTOCOCCUS MITIS/ORALIS  Blood culture (routine x 2)     Status: None   Collection Time: 01/31/24 11:12 AM   Specimen: BLOOD LEFT ARM  Result Value Ref Range Status   Specimen Description   Final    BLOOD LEFT ARM Performed at [facility], [address], [place], [place] [address]    Special Requests   Final    BOTTLES DRAWN AEROBIC AND ANAEROBIC Blood Culture results may not be optimal due to an inadequate volume of blood received in culture bottles Performed at [facility], [address], [place], [place]  [address]    Culture   Final    NO GROWTH 5 DAYS Performed at [facility], [address] [place], [place] [address]    Report Status 02/05/2024 FINAL  Final  Blood Culture ID Panel (Reflexed)     Status: Abnormal   Collection Time: 01/31/24 11:12 AM  Result Value Ref Range Status   Enterococcus faecalis NOT DETECTED NOT DETECTED Final   Enterococcus Faecium NOT DETECTED NOT DETECTED Final   Listeria monocytogenes NOT DETECTED NOT DETECTED Final   Staphylococcus species DETECTED (A) NOT DETECTED Final    Comment: CRITICAL RESULT CALLED TO, READ BACK BY AND VERIFIED WITH: T GREEN,PHARMD@[id number]  02/01/24 MK    Staphylococcus aureus (BCID) NOT DETECTED NOT DETECTED Final   Staphylococcus epidermidis DETECTED (A) NOT DETECTED Final    Comment: CRITICAL RESULT CALLED TO, READ BACK BY AND VERIFIED WITH: T GREEN,PHARMD@[id number]  02/01/24 MK    Staphylococcus lugdunensis NOT DETECTED NOT DETECTED Final   Streptococcus species DETECTED (A) NOT DETECTED Final    Comment: Not Enterococcus species, Streptococcus agalactiae, Streptococcus pyogenes, or Streptococcus pneumoniae. CRITICAL RESULT CALLED TO, READ BACK BY AND VERIFIED WITH: T GREEN,PHARMD@[id number]  02/01/24 MK    Streptococcus agalactiae NOT DETECTED NOT DETECTED Final   Streptococcus pneumoniae NOT DETECTED NOT DETECTED Final   Streptococcus pyogenes NOT DETECTED NOT DETECTED Final   A.calcoaceticus-baumannii NOT DETECTED NOT DETECTED Final   Bacteroides fragilis NOT DETECTED NOT DETECTED Final   Enterobacterales NOT DETECTED NOT DETECTED Final   Enterobacter cloacae complex NOT DETECTED NOT DETECTED Final   Escherichia coli NOT DETECTED NOT DETECTED Final   Klebsiella aerogenes NOT DETECTED NOT DETECTED Final   Klebsiella oxytoca NOT DETECTED NOT DETECTED Final   Klebsiella pneumoniae NOT DETECTED NOT DETECTED Final   Proteus species NOT DETECTED NOT DETECTED Final   Salmonella species NOT DETECTED NOT DETECTED Final   Serratia  marcescens NOT DETECTED NOT DETECTED Final   Haemophilus influenzae NOT DETECTED NOT DETECTED Final   Neisseria meningitidis NOT DETECTED NOT DETECTED Final   Pseudomonas aeruginosa NOT DETECTED NOT DETECTED Final   Stenotrophomonas maltophilia NOT DETECTED NOT DETECTED Final   Candida albicans NOT DETECTED NOT DETECTED Final   Candida auris NOT DETECTED NOT DETECTED Final   Candida glabrata NOT DETECTED NOT DETECTED Final   Candida krusei NOT DETECTED NOT DETECTED Final   Candida parapsilosis NOT DETECTED NOT DETECTED Final   Candida tropicalis NOT DETECTED NOT DETECTED Final   Cryptococcus neoformans/gattii NOT DETECTED NOT DETECTED Final   Methicillin resistance mecA/C NOT DETECTED  NOT DETECTED Final    Comment: Performed at [facility], [address], [place], [place] [address]  Culture, blood (Routine X 2) w Reflex to ID Panel     Status: None (Preliminary result)   Collection Time: 02/02/24  7:39 PM   Specimen: BLOOD RIGHT HAND  Result Value Ref Range Status   Specimen Description   Final    BLOOD RIGHT HAND Performed at [facility], [address], [place], [place] [address]    Special Requests   Final    BOTTLES DRAWN AEROBIC AND ANAEROBIC Blood Culture adequate volume Performed at [facility], [address] [place], [place] [address]    Culture   Final    NO GROWTH 4 DAYS Performed at [facility], [address] [place], [place] [address]    Report Status PENDING  Incomplete  Culture, blood (Routine X 2) w Reflex to ID Panel     Status: Abnormal   Collection Time: 02/02/24  7:44 PM   Specimen: BLOOD RIGHT HAND  Result Value Ref Range Status   Specimen Description   Final    BLOOD RIGHT HAND Performed at [facility], [address] [place], [place] [address]    Special Requests   Final    BOTTLES DRAWN AEROBIC AND ANAEROBIC Blood Culture adequate volume Performed at [facility], [address], [place], [place] [address]    Culture  Setup Time   Final    GRAM POSITIVE COCCI IN CLUSTERS ANAEROBIC BOTTLE ONLY CRITICAL VALUE NOTED.  VALUE IS CONSISTENT WITH PREVIOUSLY REPORTED AND CALLED VALUE.    Culture (A)  Final    STAPHYLOCOCCUS HOMINIS THE SIGNIFICANCE OF ISOLATING THIS ORGANISM FROM A SINGLE SET OF BLOOD CULTURES WHEN MULTIPLE SETS ARE DRAWN IS UNCERTAIN. PLEASE NOTIFY THE MICROBIOLOGY DEPARTMENT WITHIN ONE WEEK IF SPECIATION AND SENSITIVITIES ARE REQUIRED. Performed at [facility], [address] [place], [place] [address]    Report Status 02/04/2024 FINAL  Final         Radiology Studies: No results found.    Scheduled Meds:  bictegravir-emtricitabine -tenofovir  AF  1 tablet Oral Daily   lactulose   20 g Oral BID   lurasidone   40 mg Oral Q breakfast   sodium chloride  flush  3 mL Intravenous Q12H   Continuous Infusions:  ampicillin -sulbactam (UNASYN ) IV 3 g (02/06/24 0610)   linezolid  (ZYVOX ) IV 600 mg (02/06/24 1045)     LOS: 6 days    Time spent: 38 min  Barbee Lew, MD 02/06/2024, 11:37 AM

## 2024-02-06 NOTE — Plan of Care (Signed)

## 2024-02-06 NOTE — Plan of Care (Signed)
 PT STILL C/O OF H/A,.. OXY GIVEN PRN.  Problem: Education: Goal: Knowledge of General Education information will improve Description: Including pain rating scale, medication(s)/side effects and non-pharmacologic comfort measures Outcome: Progressing   Problem: Health Behavior/Discharge Planning: Goal: Ability to manage health-related needs will improve Outcome: Progressing   Problem: Clinical Measurements: Goal: Ability to maintain clinical measurements within normal limits will improve Outcome: Progressing Goal: Will remain free from infection Outcome: Progressing Goal: Diagnostic test results will improve Outcome: Progressing Goal: Respiratory complications will improve Outcome: Progressing Goal: Cardiovascular complication will be avoided Outcome: Progressing   Problem: Activity: Goal: Risk for activity intolerance will decrease Outcome: Progressing   Problem: Nutrition: Goal: Adequate nutrition will be maintained Outcome: Progressing   Problem: Coping: Goal: Level of anxiety will decrease Outcome: Progressing   Problem: Elimination: Goal: Will not experience complications related to bowel motility Outcome: Progressing Goal: Will not experience complications related to urinary retention Outcome: Progressing   Problem: Pain Managment: Goal: General experience of comfort will improve and/or be controlled Outcome: Progressing   Problem: Safety: Goal: Ability to remain free from injury will improve Outcome: Progressing   Problem: Skin Integrity: Goal: Risk for impaired skin integrity will decrease Outcome: Progressing

## 2024-02-07 ENCOUNTER — Other Ambulatory Visit (HOSPITAL_COMMUNITY): Payer: Self-pay

## 2024-02-07 DIAGNOSIS — K611 Rectal abscess: Secondary | ICD-10-CM | POA: Diagnosis not present

## 2024-02-07 LAB — CBC
HCT: 47.5 % (ref 39.0–52.0)
Hemoglobin: 16.2 g/dL (ref 13.0–17.0)
MCH: 29.6 pg (ref 26.0–34.0)
MCHC: 34.1 g/dL (ref 30.0–36.0)
MCV: 86.7 fL (ref 80.0–100.0)
Platelets: 404 10*3/uL — ABNORMAL HIGH (ref 150–400)
RBC: 5.48 MIL/uL (ref 4.22–5.81)
RDW: 12.3 % (ref 11.5–15.5)
WBC: 11 10*3/uL — ABNORMAL HIGH (ref 4.0–10.5)
nRBC: 0 % (ref 0.0–0.2)

## 2024-02-07 LAB — COMPREHENSIVE METABOLIC PANEL WITH GFR
ALT: 84 U/L — ABNORMAL HIGH (ref 0–44)
AST: 77 U/L — ABNORMAL HIGH (ref 15–41)
Albumin: 3 g/dL — ABNORMAL LOW (ref 3.5–5.0)
Alkaline Phosphatase: 61 U/L (ref 38–126)
Anion gap: 6 (ref 5–15)
BUN: 19 mg/dL (ref 6–20)
CO2: 25 mmol/L (ref 22–32)
Calcium: 8.9 mg/dL (ref 8.9–10.3)
Chloride: 104 mmol/L (ref 98–111)
Creatinine, Ser: 1.05 mg/dL (ref 0.61–1.24)
GFR, Estimated: >60 mL/min (ref >60)
Glucose, Bld: 100 mg/dL — ABNORMAL HIGH (ref 70–99)
Potassium: 4.2 mmol/L (ref 3.5–5.1)
Sodium: 135 mmol/L (ref 135–145)
Total Bilirubin: 0.4 mg/dL (ref 0.0–1.2)
Total Protein: 7.6 g/dL (ref 6.5–8.1)

## 2024-02-07 LAB — CULTURE, BLOOD (ROUTINE X 2)
Culture: NO GROWTH
Special Requests: ADEQUATE

## 2024-02-07 MED ORDER — POLYETHYLENE GLYCOL 3350 17 GM/SCOOP PO POWD
17.0000 g | Freq: Every day | ORAL | 0 refills | Status: DC | PRN
  Filled 2024-02-07: qty 238, 14d supply, fill #0

## 2024-02-07 MED ORDER — DOXYCYCLINE HYCLATE 100 MG PO CAPS
100.0000 mg | ORAL_CAPSULE | Freq: Two times a day (BID) | ORAL | 0 refills | Status: DC
  Filled 2024-02-07: qty 28, 14d supply, fill #0

## 2024-02-07 MED ORDER — OXYCODONE HCL 5 MG PO TABS
5.0000 mg | ORAL_TABLET | Freq: Three times a day (TID) | ORAL | 0 refills | Status: DC | PRN
  Filled 2024-02-07: qty 20, 5d supply, fill #0

## 2024-02-07 MED ORDER — AMOXICILLIN-POT CLAVULANATE 875-125 MG PO TABS
1.0000 | ORAL_TABLET | Freq: Two times a day (BID) | ORAL | 0 refills | Status: DC
  Filled 2024-02-07: qty 28, 14d supply, fill #0

## 2024-02-07 MED ORDER — SENNA 8.6 MG PO TABS
2.0000 | ORAL_TABLET | Freq: Every day | ORAL | 0 refills | Status: DC
  Filled 2024-02-07: qty 120, 60d supply, fill #0

## 2024-02-07 NOTE — Discharge Summary (Signed)
 Physician Discharge Summary  WILFERD RITSON WUJ:[id number] DOB: 1990/12/03 DOA: 01/31/2024  PCP: Pcp, No  Admit date: 01/31/2024 Discharge date: 02/07/2024  Admitted From: home Disposition:  home Recommendations for Outpatient Follow-up:  Follow up with PCP in 1-2 weeks Please obtain BMP/CBC in one week  Home Health:none Equipment/Devices:none Discharge Condition:stable CODE STATUS:full Diet recommendation:cardiac Brief/Interim Summary: 33 year old male with PMH recurrent cellulitis HIV, HCV, syphilis s/p treatment, septic PE not on AC, HTN, bipolar d/co, PNES who presented with rectal pain.  He reports that he developed pruritus and began scratching around his rectum.  After a few days he began developing perirectal pain and swelling on the right buttock.  He denies any drainage from the area nor any fevers at home. In in, he had also complained of developing a lesion on his left scalp that had very mildly blood at some point as well. There was concern for nonadherence to his Biktarvy  however states he is usually compliant but missed approximately 2 weeks of treatment recently.   CT abdomen/pelvis 4/14-was obtained which showed asymmetric fullness in the perianal region/perineum with ill-defined hypoattenuating area and moderate surrounding fat stranding at the area of concern.  There were phlegmonous changes but no obvious abscess as of yet. He was started on vancomycin  and Zosyn . Discharge Diagnoses:  Principal Problem:   Perirectal cellulitis Active Problems:   Sepsis (HCC)   Positive blood culture   HIV (human immunodeficiency virus infection) (HCC)   Furuncle of scalp   Chronic hepatitis C without hepatic coma (HCC)   Group A streptococcal infection  Sepsis POA presented with fever tachycardia leukocytosis and perirectal cellulitis   Perirectal cellulitis/bacteremia staph and Streptococcus mitis-with 4 cm area of induration and CT evidence of the same.  Patient was treated  with vancomycin  and Zosyn  initially.  He was consulted they started patient on Unasyn  and Zyvox  patient remained afebrile with improving leukocytosis.  He was discharged home on doxycycline  and Augmentin  for 2 weeks.  He will follow-up with Dr. Artemio Larry on May 9.   Furuncle of scalp-CT head unremarkable Continue antibiotics as above on discharge   HIV (human immunodeficiency virus infection) (HCC) - Mostly compliant but endorses missing approximately 2 weeks of treatment recently -Continue Biktarvy  -CD4 346 down from 834 in January 2025   Group A streptococcal infection - Recent positive group A strep test on 01/25/2024 -Has been on 10-day course of amoxicillin  outpatient     Estimated body mass index is 25.03 kg/m as calculated from the following:   Height as of this encounter: 5' 11.5" (1.816 m).   Weight as of this encounter: 82.6 kg.  Discharge Instructions  Discharge Instructions     Diet - low sodium heart healthy   Complete by: As directed    Increase activity slowly   Complete by: As directed       Allergies as of 02/07/2024       Reactions   Acetaminophen  Diarrhea, Nausea And Vomiting, Other (See Comments)   Flares up crohns disease   Vicodin [hydrocodone -acetaminophen ] Nausea And Vomiting   Dilaudid [hydromorphone Hcl] Hives, Rash        Medication List     STOP taking these medications    amoxicillin  500 MG capsule Commonly known as: AMOXIL        TAKE these medications    amoxicillin -clavulanate 875-125 MG tablet Commonly known as: AUGMENTIN  Take 1 tablet by mouth 2 (two) times daily.   Biktarvy  50-200-25 MG Tabs tablet Generic drug: bictegravir-emtricitabine -tenofovir  AF Take 1  tablet by mouth daily.   doxycycline  100 MG capsule Commonly known as: VIBRAMYCIN  Take 1 capsule (100 mg total) by mouth 2 (two) times daily.   ibuprofen  600 MG tablet Commonly known as: ADVIL  Take 1 tablet (600 mg total) by mouth every 6 (six) hours as needed.    lurasidone  40 MG Tabs tablet Commonly known as: LATUDA  Take 40 mg by mouth daily.   nicotine  21 mg/24hr patch Commonly known as: NICODERM CQ  - dosed in mg/24 hours Place 1 patch (21 mg total) onto the skin daily as needed (Nicotine  withdrawal symptoms.).   ondansetron  4 MG tablet Commonly known as: Zofran  Take 1 tablet (4 mg total) by mouth every 8 (eight) hours as needed for nausea or vomiting.   oxyCODONE  5 MG immediate release tablet Commonly known as: Roxicodone  Take 1 tablet (5 mg total) by mouth every 8 (eight) hours as needed.   polyethylene glycol powder 17 GM/SCOOP powder Commonly known as: GLYCOLAX /MIRALAX  Take 17 grams dissolved in liquid by mouth daily as needed for mild constipation.   senna 8.6 MG Tabs tablet Commonly known as: SENOKOT Take 2 tablets (17.2 mg total) by mouth at bedtime.   valACYclovir  500 MG tablet Commonly known as: Valtrex  Take 1 tablet (500 mg total) by mouth daily. What changed:  when to take this reasons to take this        Follow-up Information     Liane Redman, MD Follow up.   Specialty: Infectious Diseases Contact information: [address] [place] [address] [phone]                Allergies  Allergen Reactions   Acetaminophen  Diarrhea, Nausea And Vomiting and Other (See Comments)    Flares up crohns disease    Vicodin [Hydrocodone -Acetaminophen ] Nausea And Vomiting   Dilaudid [Hydromorphone Hcl] Hives and Rash    Consultations: id   Procedures/Studies: ECHOCARDIOGRAM COMPLETE BUBBLE STUDY Result Date: 02/04/2024    ECHOCARDIOGRAM REPORT   Patient Name:   SAMIL MECHAM Mabin Date of Exam: 02/04/2024 Medical Rec #:  [id number]      Height:       71.5 in Accession #:    [id number]     Weight:       182.0 lb Date of Birth:  [birth year]      BSA:          2.036 m Patient Age:    32 years       BP:           115/72 mmHg Patient Gender: M              HR:           50 bpm. Exam Location:  Inpatient  Procedure: 2D Echo, Cardiac Doppler, Color Doppler, Intracardiac Opacification            Agent and Saline Contrast Bubble Study (Both Spectral and Color Flow            Doppler were utilized during procedure). Indications:    Bacteremia  History:        Patient has no prior history of Echocardiogram examinations.                 Risk Factors:Current Smoker.  Sonographer:    Terrilee Few RCS Referring Phys: [id number] [facility] IMPRESSIONS  1. Left ventricular ejection fraction, by estimation, is 35 to 40%. The left ventricle has moderately decreased function. The left ventricle demonstrates global hypokinesis.  2.  Right ventricular systolic function is severely reduced. The right ventricular size is normal.  3. The mitral valve is normal in structure. No evidence of mitral valve regurgitation.  4. The aortic valve is tricuspid. Aortic valve regurgitation is not visualized.  5. The inferior vena cava is normal in size with greater than 50% respiratory variability, suggesting right atrial pressure of 3 mmHg.  6. Agitated saline contrast bubble study was negative, with no evidence of any interatrial shunt. FINDINGS  Left Ventricle: Left ventricular ejection fraction, by estimation, is 35 to 40%. The left ventricle has moderately decreased function. The left ventricle demonstrates global hypokinesis. Definity  contrast agent was given IV to delineate the left ventricular endocardial borders. The left ventricular internal cavity size was normal in size. There is no left ventricular hypertrophy. Right Ventricle: The right ventricular size is normal. Right vetricular wall thickness was not assessed. Right ventricular systolic function is severely reduced. Left Atrium: Left atrial size was normal in size. Right Atrium: Right atrial size was normal in size. Pericardium: There is no evidence of pericardial effusion. Mitral Valve: The mitral valve is normal in structure. No evidence of mitral valve regurgitation.  Tricuspid Valve: The tricuspid valve is normal in structure. Tricuspid valve regurgitation is trivial. Aortic Valve: The aortic valve is tricuspid. Aortic valve regurgitation is not visualized. Aortic valve peak gradient measures 4.2 mmHg. Pulmonic Valve: The pulmonic valve was normal in structure. Pulmonic valve regurgitation is trivial. Aorta: The aortic root and ascending aorta are structurally normal, with no evidence of dilitation. Venous: The inferior vena cava is normal in size with greater than 50% respiratory variability, suggesting right atrial pressure of 3 mmHg. IAS/Shunts: No atrial level shunt detected by color flow Doppler. Agitated saline contrast was given intravenously to evaluate for intracardiac shunting. Agitated saline contrast bubble study was negative, with no evidence of any interatrial shunt.  LEFT VENTRICLE PLAX 2D LVIDd:         4.80 cm   Diastology LVIDs:         4.10 cm   LV e' medial:    10.60 cm/s LV PW:         1.00 cm   LV E/e' medial:  6.0 LV IVS:        0.80 cm   LV e' lateral:   13.35 cm/s LVOT diam:     2.30 cm   LV E/e' lateral: 4.7 LV SV:         63 LV SV Index:   31 LVOT Area:     4.15 cm  RIGHT VENTRICLE             IVC RV S prime:     14.60 cm/s  IVC diam: 1.20 cm TAPSE (M-mode): 3.2 cm LEFT ATRIUM             Index        RIGHT ATRIUM           Index LA diam:        2.60 cm 1.28 cm/m   RA Area:     16.80 cm LA Vol (A2C):   33.8 ml 16.62 ml/m  RA Volume:   43.70 ml  21.46 ml/m LA Vol (A4C):   37.2 ml 18.27 ml/m LA Biplane Vol: 39.3 ml 19.30 ml/m  AORTIC VALVE AV Area (Vmax): 3.15 cm AV Vmax:        102.00 cm/s AV Peak Grad:   4.2 mmHg LVOT Vmax:      77.42 cm/s  LVOT Vmean:     46.500 cm/s LVOT VTI:       0.152 m  AORTA Ao Root diam: 3.30 cm Ao Asc diam:  2.70 cm MITRAL VALVE               TRICUSPID VALVE MV Area (PHT): 3.42 cm    TR Peak grad:   14.1 mmHg MV Decel Time: 222 msec    TR Vmax:        188.00 cm/s MV E velocity: 63.40 cm/s MV A velocity: 46.70 cm/s   SHUNTS MV E/A ratio:  1.36        Systemic VTI:  0.15 m                            Systemic Diam: 2.30 cm Ola Berger MD Electronically signed by Ola Berger MD Signature Date/Time: 02/04/2024/2:33:38 PM    Final    CT ABDOMEN PELVIS W CONTRAST Result Date: 02/03/2024 CLINICAL DATA:  Perirectal cellulitis EXAM: CT ABDOMEN AND PELVIS WITH CONTRAST TECHNIQUE: Multidetector CT imaging of the abdomen and pelvis was performed using the standard protocol following bolus administration of intravenous contrast. RADIATION DOSE REDUCTION: This exam was performed according to the departmental dose-optimization program which includes automated exposure control, adjustment of the mA and/or kV according to patient size and/or use of iterative reconstruction technique. CONTRAST:  OMNIPAQUE  IOHEXOL  300 MG/ML  SOLN COMPARISON:  CT abdomen and pelvis dated 01/31/2024 FINDINGS: Lower chest: No focal consolidation or pulmonary nodule in the lung bases. No pleural effusion or pneumothorax demonstrated. Partially imaged heart size is normal. Hepatobiliary: No focal hepatic lesions. No intra or extrahepatic biliary ductal dilation. Normal gallbladder. Pancreas: No focal lesions or main ductal dilation. Spleen: Normal in size without focal abnormality. Adrenals/Urinary Tract: No adrenal nodules. No suspicious renal mass, calculi or hydronephrosis. No focal bladder wall thickening. Stomach/Bowel: Normal appearance of the stomach. Short segment mural thickening of the ascending colon. No abnormal bowel dilation. Moderate volume stool throughout the colon. Normal appendix. Vascular/Lymphatic: No significant vascular findings are present. No enlarged abdominal or pelvic lymph nodes. Reproductive: Prostate is unremarkable. Other: Trace right hemi abdominal free fluid. No free air or fluid collection. Musculoskeletal: No acute or abnormal lytic or blastic osseous lesions. Increased asymmetric thickening of the right levator ani, which  demonstrates hyperenhancement. Persistent subcutaneous soft tissue thickening along the right medial gluteal cleft. The degree of adjacent subcutaneous soft tissue stranding is decreased. No discrete fluid collection. IMPRESSION: 1. Decreased but persistent subcutaneous soft tissue thickening along the right medial gluteal cleft with increased asymmetric thickening of the right levator ani. No discrete fluid collection. 2. Short segment mural thickening of the ascending colon, which may be related to peristalsis or colitis. 3. Moderate volume stool throughout the colon. Recommend correlation with constipation. Electronically Signed   By: Limin  Xu M.D.   On: 02/03/2024 18:01   CT Head W or Wo Contrast Result Date: 01/31/2024 CLINICAL DATA:  scalp abscess EXAM: CT HEAD WITHOUT CONTRAST TECHNIQUE: Contiguous axial images were obtained from the base of the skull through the vertex without intravenous contrast. RADIATION DOSE REDUCTION: This exam was performed according to the departmental dose-optimization program which includes automated exposure control, adjustment of the mA and/or kV according to patient size and/or use of iterative reconstruction technique. COMPARISON:  03/27/2023. FINDINGS: Brain: No evidence of acute infarction, hemorrhage, hydrocephalus, extra-axial collection or mass lesion/mass effect. Vascular: No hyperdense vessel or unexpected calcification. Skull:  Normal. Negative for fracture or focal lesion. Sinuses/Orbits: No acute finding. IMPRESSION: No acute intracranial process. Electronically Signed   By: Sydell Eva M.D.   On: 01/31/2024 20:25   CT ABDOMEN PELVIS W CONTRAST Result Date: 01/31/2024 CLINICAL DATA:  C/f perirectal abscess. EXAM: CT ABDOMEN AND PELVIS WITH CONTRAST TECHNIQUE: Multidetector CT imaging of the abdomen and pelvis was performed using the standard protocol following bolus administration of intravenous contrast. RADIATION DOSE REDUCTION: This exam was performed  according to the departmental dose-optimization program which includes automated exposure control, adjustment of the mA and/or kV according to patient size and/or use of iterative reconstruction technique. CONTRAST:  OMNIPAQUE  IOHEXOL  300 MG/ML  SOLN COMPARISON:  CT scan abdomen and pelvis from 10/31/2018. FINDINGS: Lower chest: There are patchy atelectatic changes in the visualized lung bases. No overt consolidation. No pleural effusion. The heart is normal in size. No pericardial effusion. Hepatobiliary: The liver is normal in size. Non-cirrhotic configuration. No suspicious mass. These is mild diffuse hepatic steatosis. No intrahepatic or extrahepatic bile duct dilation. No calcified gallstones. Normal gallbladder wall thickness. No pericholecystic inflammatory changes. Pancreas: Unremarkable. No pancreatic ductal dilatation or surrounding inflammatory changes. Spleen: Within normal limits. No focal lesion. Adrenals/Urinary Tract: Adrenal glands are unremarkable. No suspicious renal mass. No hydronephrosis. No renal or ureteric calculi. Unremarkable urinary bladder. Stomach/Bowel: No disproportionate dilation of the small or large bowel loops. No evidence of abnormal bowel wall thickening or inflammatory changes. The appendix is unremarkable. Vascular/Lymphatic: No ascites or pneumoperitoneum. No abdominal or pelvic lymphadenopathy, by size criteria. No aneurysmal dilation of the major abdominal arteries. Reproductive: Normal size prostate. Symmetric seminal vesicles. Other: There is asymmetric fullness in the perianal region/perineum. There are subtle ill-defined hypoattenuating areas in this region however, no discrete walled-off abscess seen. There is moderate surrounding fat stranding. Findings represent phlegmonous changes/developing abscess. The soft tissues and abdominal wall are otherwise unremarkable. Musculoskeletal: No suspicious osseous lesions. There are mild multilevel degenerative changes in  the visualized spine. 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 IMPRESSION: 1. There is asymmetric fullness in the perianal region/perineum with subtle ill-defined hypoattenuating areas and moderate surrounding fat stranding. Findings represent phlegmonous changes/developing abscess. No discrete walled-off drainable abscess seen at this time. 2. Multiple other nonacute observations, as described above. Electronically Signed   By: Beula Brunswick M.D.   On: 01/31/2024 15:14   (Echo, Carotid, EGD, Colonoscopy, ERCP)    Subjective:  Resting in bed no c/o overnight  Discharge Exam: Vitals:   02/06/24 1930 02/07/24 0505  BP: 129/76 128/77  Pulse: 60 (!) 51  Resp: 17 17  Temp: 98.1 F (36.7 C) 98.2 F (36.8 C)  SpO2: 99% 100%   Vitals:   02/06/24 0539 02/06/24 1254 02/06/24 1930 02/07/24 0505  BP: 116/71 110/68 129/76 128/77  Pulse: (!) 49 (!) 53 60 (!) 51  Resp: 20 18 17 17   Temp: 97.8 F (36.6 C) 97.8 F (36.6 C) 98.1 F (36.7 C) 98.2 F (36.8 C)  TempSrc:  Oral Oral Oral  SpO2: 99% 99% 99% 100%  Weight:      Height:        General: Pt is alert, awake, not in acute distress Cardiovascular: RRR, S1/S2 +, no rubs, no gallops Respiratory: CTA bilaterally, no wheezing, no rhonchi Abdominal: Soft, NT, ND, bowel sounds + Extremities: no edema, no cyanosis    The results of significant diagnostics from this hospitalization (including imaging, microbiology, ancillary and laboratory) are listed below for reference.     Microbiology:  Recent Results (from the past 240 hours)  Blood culture (routine x 2)     Status: Abnormal   Collection Time: 01/31/24 11:12 AM   Specimen: BLOOD LEFT ARM  Result Value Ref Range Status   Specimen Description   Final    BLOOD LEFT ARM Performed at [facility], [address], [place], [place] [address]    Special Requests   Final    BOTTLES DRAWN AEROBIC AND ANAEROBIC Blood Culture adequate volume Performed at [facility], [address] [place], [place] [address]    Culture  Setup Time   Final    GRAM POSITIVE COCCI IN CHAINS ANAEROBIC BOTTLE ONLY Organism ID to follow CRITICAL RESULT CALLED TO, READ BACK BY AND VERIFIED WITH: T GREEN,PHARMD@[id number]  02/01/24 MK Performed at [facility], [address] [place], [place] [address]    Culture STREPTOCOCCUS MITIS/ORALIS (A)  Final   Report Status 02/03/2024 FINAL  Final   Organism ID, Bacteria STREPTOCOCCUS MITIS/ORALIS  Final      Susceptibility   Streptococcus mitis/oralis - MIC*    PENICILLIN  0.25 INTERMEDIATE Intermediate     CEFTRIAXONE  <=0.12 SENSITIVE Sensitive     LEVOFLOXACIN  1 SENSITIVE Sensitive     VANCOMYCIN  0.5 SENSITIVE Sensitive     * STREPTOCOCCUS MITIS/ORALIS  Blood culture (routine x 2)     Status: None   Collection Time: 01/31/24 11:12 AM   Specimen: BLOOD LEFT ARM  Result Value Ref Range Status   Specimen Description   Final    BLOOD LEFT ARM Performed at [facility], [address], [place], [place] [address]    Special Requests   Final    BOTTLES DRAWN AEROBIC AND ANAEROBIC Blood Culture results may not be optimal due to an inadequate volume of blood received in culture bottles Performed at [facility], [address] [place], [place] [address]    Culture   Final    NO GROWTH 5 DAYS Performed at [facility], [address], [place], [place] [address]    Report Status 02/05/2024 FINAL  Final  Blood Culture ID Panel (Reflexed)     Status: Abnormal   Collection Time: 01/31/24 11:12 AM  Result Value Ref Range Status   Enterococcus faecalis NOT DETECTED NOT DETECTED Final   Enterococcus Faecium NOT DETECTED NOT DETECTED Final   Listeria monocytogenes NOT DETECTED NOT DETECTED Final   Staphylococcus species DETECTED (A) NOT DETECTED Final    Comment: CRITICAL RESULT CALLED TO, READ BACK BY AND VERIFIED WITH: T GREEN,PHARMD@[id number]  02/01/24 MK    Staphylococcus  aureus (BCID) NOT DETECTED NOT DETECTED Final   Staphylococcus epidermidis DETECTED (A) NOT DETECTED Final    Comment: CRITICAL RESULT CALLED TO, READ BACK BY AND VERIFIED WITH: T GREEN,PHARMD@[id number]  02/01/24 MK    Staphylococcus lugdunensis NOT DETECTED NOT DETECTED Final   Streptococcus species DETECTED (A) NOT DETECTED Final    Comment: Not Enterococcus species, Streptococcus agalactiae, Streptococcus pyogenes, or Streptococcus pneumoniae. CRITICAL RESULT CALLED TO, READ BACK BY AND VERIFIED WITH: T GREEN,PHARMD@[id number]  02/01/24 MK    Streptococcus agalactiae NOT DETECTED NOT DETECTED Final   Streptococcus pneumoniae NOT DETECTED NOT DETECTED Final   Streptococcus pyogenes NOT DETECTED NOT DETECTED Final   A.calcoaceticus-baumannii NOT DETECTED NOT DETECTED Final   Bacteroides fragilis NOT DETECTED NOT DETECTED Final   Enterobacterales NOT DETECTED NOT DETECTED Final   Enterobacter cloacae complex NOT DETECTED NOT DETECTED Final   Escherichia coli NOT DETECTED  NOT DETECTED Final   Klebsiella aerogenes NOT DETECTED NOT DETECTED Final   Klebsiella oxytoca NOT DETECTED NOT DETECTED Final   Klebsiella pneumoniae NOT DETECTED NOT DETECTED Final   Proteus species NOT DETECTED NOT DETECTED Final   Salmonella species NOT DETECTED NOT DETECTED Final   Serratia marcescens NOT DETECTED NOT DETECTED Final   Haemophilus influenzae NOT DETECTED NOT DETECTED Final   Neisseria meningitidis NOT DETECTED NOT DETECTED Final   Pseudomonas aeruginosa NOT DETECTED NOT DETECTED Final   Stenotrophomonas maltophilia NOT DETECTED NOT DETECTED Final   Candida albicans NOT DETECTED NOT DETECTED Final   Candida auris NOT DETECTED NOT DETECTED Final   Candida glabrata NOT DETECTED NOT DETECTED Final   Candida krusei NOT DETECTED NOT DETECTED Final   Candida parapsilosis NOT DETECTED NOT DETECTED Final   Candida tropicalis NOT DETECTED NOT DETECTED Final   Cryptococcus neoformans/gattii NOT DETECTED NOT DETECTED  Final   Methicillin resistance mecA/C NOT DETECTED NOT DETECTED Final    Comment: Performed at [facility], [address], [place], [place] [address]  Culture, blood (Routine X 2) w Reflex to ID Panel     Status: None   Collection Time: 02/02/24  7:39 PM   Specimen: BLOOD RIGHT HAND  Result Value Ref Range Status   Specimen Description   Final    BLOOD RIGHT HAND Performed at [facility], [address], [place], [place] [address]    Special Requests   Final    BOTTLES DRAWN AEROBIC AND ANAEROBIC Blood Culture adequate volume Performed at [facility], [address], [place], [place] [address]    Culture   Final    NO GROWTH 5 DAYS Performed at [facility], [address], [place], [place] [address]    Report Status 02/07/2024 FINAL  Final  Culture, blood (Routine X 2) w Reflex to ID Panel     Status: Abnormal   Collection Time: 02/02/24  7:44 PM   Specimen: BLOOD RIGHT HAND  Result Value Ref Range Status   Specimen Description   Final    BLOOD RIGHT HAND Performed at [facility], [address] [place], [place] [address]    Special Requests   Final    BOTTLES DRAWN AEROBIC AND ANAEROBIC Blood Culture adequate volume Performed at [facility], [address], [place], [place] [address]    Culture  Setup Time   Final    GRAM POSITIVE COCCI IN CLUSTERS ANAEROBIC BOTTLE ONLY CRITICAL VALUE NOTED.  VALUE IS CONSISTENT WITH PREVIOUSLY REPORTED AND CALLED VALUE.    Culture (A)  Final    STAPHYLOCOCCUS HOMINIS THE SIGNIFICANCE OF ISOLATING THIS ORGANISM FROM A SINGLE SET OF BLOOD CULTURES WHEN MULTIPLE SETS ARE DRAWN IS UNCERTAIN. PLEASE NOTIFY THE MICROBIOLOGY DEPARTMENT WITHIN ONE WEEK IF SPECIATION AND SENSITIVITIES ARE REQUIRED. Performed at [facility], [address], [place], [place] [address]    Report Status 02/04/2024 FINAL  Final     Labs: BNP (last 3 results) No results for input(s): "BNP"  in the last 8760 hours. Basic Metabolic Panel: Recent Labs  Lab 02/01/24 0522 02/05/24 0702 02/07/24 0529  NA 133* 137 135  K 4.1 4.3 4.2  CL 103 104 104  CO2 21* 27 25  GLUCOSE 101* 145* 100*  BUN 21* 15 19  CREATININE 0.97 1.02 1.05  CALCIUM 8.3* 8.4* 8.9  MG 2.0  --   --   PHOS 2.7  --   --  Liver Function Tests: Recent Labs  Lab 02/05/24 0702 02/07/24 0529  AST 64* 77*  ALT 76* 84*  ALKPHOS 63 61  BILITOT 0.4 0.4  PROT 7.0 7.6  ALBUMIN 2.6* 3.0*   No results for input(s): "LIPASE", "AMYLASE" in the last 168 hours. No results for input(s): "AMMONIA" in the last 168 hours. CBC: Recent Labs  Lab 02/01/24 0522 02/04/24 1041 02/07/24 0529  WBC 16.8* 11.0* 11.0*  HGB 15.7 15.4 16.2  HCT 47.1 46.1 47.5  MCV 89.5 87.1 86.7  PLT 280 375 404*   Cardiac Enzymes: No results for input(s): "CKTOTAL", "CKMB", "CKMBINDEX", "TROPONINI" in the last 168 hours. BNP: Invalid input(s): "POCBNP" CBG: No results for input(s): "GLUCAP" in the last 168 hours. D-Dimer No results for input(s): "DDIMER" in the last 72 hours. Hgb A1c No results for input(s): "HGBA1C" in the last 72 hours. Lipid Profile No results for input(s): "CHOL", "HDL", "LDLCALC", "TRIG", "CHOLHDL", "LDLDIRECT" in the last 72 hours. Thyroid function studies No results for input(s): "TSH", "T4TOTAL", "T3FREE", "THYROIDAB" in the last 72 hours.  Invalid input(s): "FREET3" Anemia work up No results for input(s): "VITAMINB12", "FOLATE", "FERRITIN", "TIBC", "IRON", "RETICCTPCT" in the last 72 hours. Urinalysis    Component Value Date/Time   COLORURINE YELLOW 09/24/2023 0045   APPEARANCEUR CLEAR 09/24/2023 0045   LABSPEC 1.015 09/24/2023 0045   PHURINE 6.0 09/24/2023 0045   GLUCOSEU NEGATIVE 09/24/2023 0045   HGBUR NEGATIVE 09/24/2023 0045   BILIRUBINUR NEGATIVE 09/24/2023 0045   KETONESUR NEGATIVE 09/24/2023 0045   PROTEINUR NEGATIVE 09/24/2023 0045   UROBILINOGEN 1.0 08/04/2015 1935   NITRITE  NEGATIVE 09/24/2023 0045   LEUKOCYTESUR NEGATIVE 09/24/2023 0045   Sepsis Labs Recent Labs  Lab 02/01/24 0522 02/04/24 1041 02/07/24 0529  WBC 16.8* 11.0* 11.0*   Microbiology Recent Results (from the past 240 hours)  Blood culture (routine x 2)     Status: Abnormal   Collection Time: 01/31/24 11:12 AM   Specimen: BLOOD LEFT ARM  Result Value Ref Range Status   Specimen Description   Final    BLOOD LEFT ARM Performed at [facility], [address] [place], [place] [address]    Special Requests   Final    BOTTLES DRAWN AEROBIC AND ANAEROBIC Blood Culture adequate volume Performed at [facility], [address] [place], [place] [address]    Culture  Setup Time   Final    GRAM POSITIVE COCCI IN CHAINS ANAEROBIC BOTTLE ONLY Organism ID to follow CRITICAL RESULT CALLED TO, READ BACK BY AND VERIFIED WITH: T GREEN,PHARMD@[id number]  02/01/24 MK Performed at [facility], [address] [place], [place] [address]    Culture STREPTOCOCCUS MITIS/ORALIS (A)  Final   Report Status 02/03/2024 FINAL  Final   Organism ID, Bacteria STREPTOCOCCUS MITIS/ORALIS  Final      Susceptibility   Streptococcus mitis/oralis - MIC*    PENICILLIN  0.25 INTERMEDIATE Intermediate     CEFTRIAXONE  <=0.12 SENSITIVE Sensitive     LEVOFLOXACIN  1 SENSITIVE Sensitive     VANCOMYCIN  0.5 SENSITIVE Sensitive     * STREPTOCOCCUS MITIS/ORALIS  Blood culture (routine x 2)     Status: None   Collection Time: 01/31/24 11:12 AM   Specimen: BLOOD LEFT ARM  Result Value Ref Range Status   Specimen Description   Final    BLOOD LEFT ARM Performed at [facility], [address] [place], [place] [address]    Special Requests   Final    BOTTLES  DRAWN AEROBIC AND ANAEROBIC Blood Culture results may not be optimal due to an inadequate volume of blood received in culture bottles Performed at [facility], [address] [place], [place] [address]    Culture    Final    NO GROWTH 5 DAYS Performed at [facility], [address] [place], [place] [address]    Report Status 02/05/2024 FINAL  Final  Blood Culture ID Panel (Reflexed)     Status: Abnormal   Collection Time: 01/31/24 11:12 AM  Result Value Ref Range Status   Enterococcus faecalis NOT DETECTED NOT DETECTED Final   Enterococcus Faecium NOT DETECTED NOT DETECTED Final   Listeria monocytogenes NOT DETECTED NOT DETECTED Final   Staphylococcus species DETECTED (A) NOT DETECTED Final    Comment: CRITICAL RESULT CALLED TO, READ BACK BY AND VERIFIED WITH: T GREEN,PHARMD@[id number]  02/01/24 MK    Staphylococcus aureus (BCID) NOT DETECTED NOT DETECTED Final   Staphylococcus epidermidis DETECTED (A) NOT DETECTED Final    Comment: CRITICAL RESULT CALLED TO, READ BACK BY AND VERIFIED WITH: T GREEN,PHARMD@[id number]  02/01/24 MK    Staphylococcus lugdunensis NOT DETECTED NOT DETECTED Final   Streptococcus species DETECTED (A) NOT DETECTED Final    Comment: Not Enterococcus species, Streptococcus agalactiae, Streptococcus pyogenes, or Streptococcus pneumoniae. CRITICAL RESULT CALLED TO, READ BACK BY AND VERIFIED WITH: T GREEN,PHARMD@[id number]  02/01/24 MK    Streptococcus agalactiae NOT DETECTED NOT DETECTED Final   Streptococcus pneumoniae NOT DETECTED NOT DETECTED Final   Streptococcus pyogenes NOT DETECTED NOT DETECTED Final   A.calcoaceticus-baumannii NOT DETECTED NOT DETECTED Final   Bacteroides fragilis NOT DETECTED NOT DETECTED Final   Enterobacterales NOT DETECTED NOT DETECTED Final   Enterobacter cloacae complex NOT DETECTED NOT DETECTED Final   Escherichia coli NOT DETECTED NOT DETECTED Final   Klebsiella aerogenes NOT DETECTED NOT DETECTED Final   Klebsiella oxytoca NOT DETECTED NOT DETECTED Final   Klebsiella pneumoniae NOT DETECTED NOT DETECTED Final   Proteus species NOT DETECTED NOT DETECTED Final   Salmonella species NOT DETECTED NOT DETECTED Final   Serratia marcescens NOT DETECTED NOT  DETECTED Final   Haemophilus influenzae NOT DETECTED NOT DETECTED Final   Neisseria meningitidis NOT DETECTED NOT DETECTED Final   Pseudomonas aeruginosa NOT DETECTED NOT DETECTED Final   Stenotrophomonas maltophilia NOT DETECTED NOT DETECTED Final   Candida albicans NOT DETECTED NOT DETECTED Final   Candida auris NOT DETECTED NOT DETECTED Final   Candida glabrata NOT DETECTED NOT DETECTED Final   Candida krusei NOT DETECTED NOT DETECTED Final   Candida parapsilosis NOT DETECTED NOT DETECTED Final   Candida tropicalis NOT DETECTED NOT DETECTED Final   Cryptococcus neoformans/gattii NOT DETECTED NOT DETECTED Final   Methicillin resistance mecA/C NOT DETECTED NOT DETECTED Final    Comment: Performed at [facility], [address], [place], [place] [address]  Culture, blood (Routine X 2) w Reflex to ID Panel     Status: None   Collection Time: 02/02/24  7:39 PM   Specimen: BLOOD RIGHT HAND  Result Value Ref Range Status   Specimen Description   Final    BLOOD RIGHT HAND Performed at [facility], [address], [place], [place] [address]    Special Requests   Final    BOTTLES DRAWN AEROBIC AND ANAEROBIC Blood Culture adequate volume Performed at [facility], [address] [place], [place] [address]    Culture   Final    NO GROWTH 5 DAYS Performed  at [facility], [address] [place], [place] [address]    Report Status 02/07/2024 FINAL  Final  Culture, blood (Routine X 2) w Reflex to ID Panel     Status: Abnormal   Collection Time: 02/02/24  7:44 PM   Specimen: BLOOD RIGHT HAND  Result Value Ref Range Status   Specimen Description   Final    BLOOD RIGHT HAND Performed at [facility], [address] [place], [place] [address]    Special Requests   Final    BOTTLES DRAWN AEROBIC AND ANAEROBIC Blood Culture adequate volume Performed at [facility], [address], [place], [place] [address]    Culture  Setup  Time   Final    GRAM POSITIVE COCCI IN CLUSTERS ANAEROBIC BOTTLE ONLY CRITICAL VALUE NOTED.  VALUE IS CONSISTENT WITH PREVIOUSLY REPORTED AND CALLED VALUE.    Culture (A)  Final    STAPHYLOCOCCUS HOMINIS THE SIGNIFICANCE OF ISOLATING THIS ORGANISM FROM A SINGLE SET OF BLOOD CULTURES WHEN MULTIPLE SETS ARE DRAWN IS UNCERTAIN. PLEASE NOTIFY THE MICROBIOLOGY DEPARTMENT WITHIN ONE WEEK IF SPECIATION AND SENSITIVITIES ARE REQUIRED. Performed at [facility], [address], [place], [place] [address]    Report Status 02/04/2024 FINAL  Final     Time coordinating discharge: 38 min SIGNED: Barbee Lew, MD  [facility] 02/07/2024, 3:58 PM

## 2024-02-10 ENCOUNTER — Other Ambulatory Visit (HOSPITAL_COMMUNITY): Payer: Self-pay

## 2024-02-13 NOTE — Progress Notes (Unsigned)
 HPI: Pedro Owens is a 33 y.o. male who presents to the [facility] pharmacy clinic for HIV follow-up.  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   Genital herpes 12/14/2019   Severe recurrent major depression w/psychotic features, mood-congruent (HCC) 10/04/2018   Transgender    Pleural effusion, bacterial 11/23/2017   Loculated pleural effusion 11/23/2017   Chronic hepatitis C without hepatic coma (HCC) 11/19/2017   Septic pulmonary embolism (HCC) 11/19/2017   IVDU (intravenous drug user)    Endocarditis, suspected 11/18/2017   HIV (human immunodeficiency virus infection) (HCC) 04/07/2016   Cigarette smoker 04/07/2016   Polysubstance abuse (HCC) 04/07/2016   Depression 08/14/2015   Conversion disorder 08/14/2015   Ileitis 08/12/2015   Hemorrhoids, internal, with bleeding 08/25/2011   Anal warts 08/25/2011   Chronic abdominal pain 11/07/2010   ANXIETY 09/24/2010   ADHD 09/24/2010   Altered bowel habits 09/24/2010    Patient's Medications  New Prescriptions   No medications on file  Previous Medications   AMOXICILLIN -CLAVULANATE (AUGMENTIN ) 875-125 MG TABLET    Take 1 tablet by mouth 2 (two) times daily.   BICTEGRAVIR-EMTRICITABINE -TENOFOVIR  AF (BIKTARVY ) 50-200-25 MG TABS TABLET    Take 1 tablet by mouth daily.   DOXYCYCLINE  (VIBRAMYCIN ) 100 MG CAPSULE    Take 1 capsule (100 mg total) by mouth 2 (two) times daily.   IBUPROFEN  (ADVIL ) 600 MG TABLET    Take 1 tablet (600 mg total) by mouth every 6 (six) hours as needed.   LURASIDONE  (LATUDA ) 40 MG TABS TABLET    Take 40 mg by mouth daily.   NICOTINE  (NICODERM CQ  - DOSED IN MG/24 HOURS) 21 MG/24HR  PATCH    Place 1 patch (21 mg total) onto the skin daily as needed (Nicotine  withdrawal symptoms.).   ONDANSETRON  (ZOFRAN ) 4 MG TABLET    Take 1 tablet (4 mg total) by mouth every 8 (eight) hours as needed for nausea or vomiting.   OXYCODONE  (ROXICODONE ) 5 MG IMMEDIATE RELEASE TABLET    Take 1 tablet (5 mg total) by mouth every 8 (eight) hours as needed.   POLYETHYLENE GLYCOL POWDER (GLYCOLAX /MIRALAX ) 17 GM/SCOOP POWDER    Take 17 grams dissolved in liquid by mouth daily as needed for mild constipation.   SENNA (SENOKOT) 8.6 MG TABS TABLET    Take 2 tablets (17.2 mg total) by mouth at bedtime.   VALACYCLOVIR  (VALTREX ) 500 MG TABLET    Take 1 tablet (500 mg total) by mouth daily.  Modified Medications   No medications on file  Discontinued Medications   No medications on file    Labs: Lab Results  Component Value Date   HIV1RNAQUANT 1,880 02/04/2024   HIV1RNAQUANT 284 (H) 11/17/2023   HIV1RNAQUANT 60 09/22/2023   CD4TABS 346 (L) 01/31/2024   CD4TABS 834 11/17/2023   CD4TABS 475 09/22/2023    RPR and STI Lab Results  Component Value Date   LABRPR Reactive (A) 02/01/2024   LABRPR Reactive (A) 09/22/2023   LABRPR REACTIVE (A) 03/03/2023   LABRPR Reactive (A) 08/19/2022   LABRPR REACTIVE (A) 02/12/2022   RPRTITER 1:1,024 (H) 03/03/2023   RPRTITER 1:4 (H) 02/12/2022   RPRTITER 1:16 (H) 03/12/2021  RPRTITER 1:2,048 (H) 10/17/2020   RPRTITER 1:1,024 (H) 12/14/2019    STI Results GC CT  02/03/2024  2:34 PM Negative    Negative  Negative    Negative   01/31/2024  9:54 PM Negative  Negative   01/31/2024  9:53 PM Negative  Negative   11/17/2023 11:09 AM Negative  Negative   03/12/2021 10:22 AM Negative  Negative   10/17/2020 12:20 PM Negative  Negative   12/14/2019  2:27 PM Negative    Negative    Negative  Negative    Negative    Negative   07/19/2019 11:46 AM Negative  Negative   05/30/2018 12:00 AM Negative  Negative   03/26/2016 12:00 AM Negative  Negative    09/08/2011  1:44 PM  NEGATIVE     Hepatitis B Lab Results  Component Value Date   HEPBSAB NEG 03/26/2016   HEPBSAG NON-REACTIVE 03/03/2023   HEPBCAB NON-REACTIVE 03/03/2023   Hepatitis C Lab Results  Component Value Date   HCVRNAPCRQN 15,300,000 (H) 03/03/2023   Hepatitis A Lab Results  Component Value Date   HAV REACTIVE (A) 03/26/2016   Lipids: Lab Results  Component Value Date   CHOL 132 02/12/2022   TRIG 266 (H) 02/12/2022   HDL 42 02/12/2022   CHOLHDL 3.1 02/12/2022   VLDL 23 01/06/2017   LDLCALC 59 02/12/2022    Current HIV Regimen: Biktarvy   Assessment: Pedro Owens is a 33 yo male presenting for an HIV follow-up appointment. He remains on Biktarvy , but states that he got into a fight with his mom and ended up leaving his medication at home last month. He was off medication for the entire month of March. He was subsequently hospitalized in April for perirectal cellulitis and while hospitalized received Biktarvy . He then received a sample from the hospital to get him to this appointment, but left those at home as well and never took the sample.   Last HIV RNA was 1880 on 02/04/2024. With not being on medication since hospitalization will not recheck today but discussed potentially rechecking at next appointment in May once restarted.   Expressed importance of remaining adherent to medication and the potential for resistance to occur when not taking medication properly. Patient expressed understanding.   Recent hospitalization for perirectal cellulitis and discharged on doxycycline  and Augmentin . Patient reports adherence to antibiotics with no side effects noted.   Plan: - Biktarvy  x 3 mo sent to [facility]  - F/u on 02/25/2024 with Dr. Levern Reader - Call with questions or concerns  Barbra Boone, PharmD PGY1 Acute Care Pharmacy Resident  02/14/2024 2:29 PM

## 2024-02-14 ENCOUNTER — Ambulatory Visit (INDEPENDENT_AMBULATORY_CARE_PROVIDER_SITE_OTHER): Admitting: Pharmacist

## 2024-02-14 ENCOUNTER — Other Ambulatory Visit: Payer: Self-pay

## 2024-02-14 ENCOUNTER — Other Ambulatory Visit (HOSPITAL_COMMUNITY): Payer: Self-pay

## 2024-02-14 DIAGNOSIS — B20 Human immunodeficiency virus [HIV] disease: Secondary | ICD-10-CM | POA: Diagnosis present

## 2024-02-14 MED ORDER — BICTEGRAVIR-EMTRICITAB-TENOFOV 50-200-25 MG PO TABS: 1.0000 | ORAL_TABLET | Freq: Every day | ORAL | 2 refills | Status: DC

## 2024-02-22 ENCOUNTER — Ambulatory Visit: Admitting: Podiatry

## 2024-02-25 ENCOUNTER — Inpatient Hospital Stay: Payer: Self-pay | Admitting: Internal Medicine

## 2024-03-01 ENCOUNTER — Ambulatory Visit (INDEPENDENT_AMBULATORY_CARE_PROVIDER_SITE_OTHER): Admitting: Podiatry

## 2024-03-01 ENCOUNTER — Encounter: Payer: Self-pay | Admitting: Podiatry

## 2024-03-01 ENCOUNTER — Ambulatory Visit (INDEPENDENT_AMBULATORY_CARE_PROVIDER_SITE_OTHER)

## 2024-03-01 VITALS — Ht 71.5 in | Wt 182.0 lb

## 2024-03-01 DIAGNOSIS — Q6651 Congenital pes planus, right foot: Secondary | ICD-10-CM | POA: Insufficient documentation

## 2024-03-01 DIAGNOSIS — Q6652 Congenital pes planus, left foot: Secondary | ICD-10-CM | POA: Diagnosis not present

## 2024-03-01 DIAGNOSIS — M7751 Other enthesopathy of right foot: Secondary | ICD-10-CM

## 2024-03-01 DIAGNOSIS — M7752 Other enthesopathy of left foot: Secondary | ICD-10-CM | POA: Diagnosis not present

## 2024-03-01 DIAGNOSIS — M779 Enthesopathy, unspecified: Secondary | ICD-10-CM

## 2024-03-02 NOTE — Progress Notes (Signed)
 Subjective:   Patient ID: Pedro Owens, male   DOB: 33 y.o.   MRN: [id number]   HPI Patient states he had surgery on his feet when he was in high school and he is just concerned about foot pain and the possibility whether or not the implants that were placed may have moved he wants to check.  States the pain is not severe and only occurs after prolonged weightbearing.  Patient is not currently smoking is under relatively good health with long-term history of HIV   Review of Systems  All other systems reviewed and are negative.       Objective:  Physical Exam Vitals and nursing note reviewed.  Constitutional:      Appearance: He is well-developed.  Pulmonary:     Effort: Pulmonary effort is normal.  Musculoskeletal:        General: Normal range of motion.  Skin:    General: Skin is warm.  Neurological:     Mental Status: He is alert.     Neurovascular status mildly reduced range of motion wise with scars on the right first metatarsal and the sinus tarsi bilateral relatively well-developed arch elongated second digits bilateral.  Good digital perfusion well-oriented     Assessment:  History of surgery with patient and relatively stable health but some concern overall that there may be pathology around the digits with moderate history of surgery 8     Plan:  MP x-rays reviewed and at this point I discussed wide accommodating shoes soft materials stretching and do not recommend further surgery.  May be required in future but do not see this is a good short-term or even medium term solution to any problems  X-rays indicate that there is subtalar joint arthrodesis procedures that were performed implants in place moderate depression but good arch structure elongation second digit bilateral

## 2024-03-03 ENCOUNTER — Ambulatory Visit: Admitting: Orthopedic Surgery

## 2024-03-21 ENCOUNTER — Other Ambulatory Visit: Payer: Self-pay | Admitting: Internal Medicine

## 2024-03-21 NOTE — Telephone Encounter (Signed)
 Please advise, patient has received two doses in the past. Do you want 500 mg daily or 500 mg BID?

## 2024-03-22 ENCOUNTER — Ambulatory Visit: Admitting: Orthopedic Surgery

## 2024-03-22 NOTE — Telephone Encounter (Signed)
 TX is bid. 14 day max  Prophy is once a day if more than 3 outbreaks a year

## 2024-03-28 ENCOUNTER — Ambulatory Visit: Payer: Self-pay | Admitting: Podiatry

## 2024-04-07 ENCOUNTER — Ambulatory Visit: Admitting: Orthopedic Surgery

## 2024-04-07 ENCOUNTER — Other Ambulatory Visit (INDEPENDENT_AMBULATORY_CARE_PROVIDER_SITE_OTHER): Payer: Self-pay

## 2024-04-07 ENCOUNTER — Other Ambulatory Visit: Payer: Self-pay | Admitting: Orthopedic Surgery

## 2024-04-07 DIAGNOSIS — M545 Low back pain, unspecified: Secondary | ICD-10-CM

## 2024-04-07 MED ORDER — MELOXICAM 15 MG PO TABS: ORAL_TABLET | ORAL | 0 refills | Status: DC

## 2024-04-07 MED ORDER — METHOCARBAMOL 500 MG PO TABS: 500.0000 mg | ORAL_TABLET | Freq: Three times a day (TID) | ORAL | 0 refills | Status: DC | PRN

## 2024-04-07 NOTE — Telephone Encounter (Signed)
 Okay for 1 tablet a day for 7 days and then 1 tablet daily as needed for pain thereafter #30 thanks

## 2024-04-07 NOTE — Addendum Note (Signed)
 Addended by: Catherene Close on: 04/07/2024 04:03 PM   Modules accepted: Orders

## 2024-04-08 ENCOUNTER — Encounter: Payer: Self-pay | Admitting: Orthopedic Surgery

## 2024-04-08 NOTE — Progress Notes (Signed)
 Office Visit Note   Patient: Pedro Owens           Date of Birth: 03-Apr-1991           MRN: [id number] Visit Date: 04/07/2024 Requested by: No referring provider defined for this encounter. PCP: Pcp, No  Subjective: Chief Complaint  Patient presents with   Lower Back - Pain    HPI: Pedro Owens is a 33 y.o. male who presents to the office reporting multiple year history of low back pain.  Patient states he has had back pain for as long as I can remember.  The pain is now more constant.  Denies any history of injury.  Reports pain at a level of 8 out of 10.  Does not wake him from sleep.  Does report some left sided radicular pain with no numbness and tingling.  Has tried some over-the-counter medication without much relief.  He works at [employer].  He does loading and unloading of packages.  Sitting flexes okay but whenever he does any type of extension or sitting upright it is difficult.  He has done some physical therapy and rehabilitation for this with a chiropractor when he was in formal rehab in 2018.  Denies any systemic symptoms of fevers chills or weight loss..                ROS: All systems reviewed are negative as they relate to the chief complaint within the history of present illness.  Patient denies fevers or chills.  Assessment & Plan: Visit Diagnoses:  1. 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  Not much in terms of arthritis in the remaining portion of the lumbar spine.  Plan is noted to be out of work both on the clinic today as well as the day before clinic today.  Mobic  prescribed for 7 days and then as needed.  Robaxin  also prescribed.  He needs an MRI scan of the lumbar spine to evaluate this L4 vertebral body cyst.  Follow-up after that study.  Follow-Up Instructions:  No follow-ups on file.   Orders:  Orders Placed This Encounter  Procedures   XR Lumbar Spine 2-3 Views   MR Lumbar Spine w/o contrast   Meds ordered this encounter  Medications   meloxicam  (MOBIC ) 15 MG tablet    Sig: 1 po q d x 7 days    Dispense:  30 tablet    Refill:  0   methocarbamol  (ROBAXIN ) 500 MG tablet    Sig: Take 1 tablet (500 mg total) by mouth every 8 (eight) hours as needed for muscle spasms.    Dispense:  30 tablet    Refill:  0      Procedures: No procedures performed   Clinical Data: No additional findings.  Objective: Vital Signs: There were no vitals taken for this visit.  Physical Exam:  Constitutional: Patient appears well-developed HEENT:  Head: Normocephalic Eyes:EOM are normal Neck: Normal range of motion Cardiovascular: Normal rate Pulmonary/chest: Effort normal Neurologic: Patient is alert Skin: Skin is warm Psychiatric: Patient has normal mood and affect  Ortho Exam: Ortho exam demonstrates mildly positive nerve root tension signs on the left compared to the right.  No atrophy.  Reflexes symmetric at 0 to 1+ out of 4 bilateral patella and Achilles.  No  paresthesias L1-S1 bilaterally.  No groin pain with internal or external rotation of the leg.  5 out of 5 ankle dorsiflexion plantarflexion quad and hamstring strength.  More pain with extension and flexion.  No trochanteric tenderness is noted.  Flexibility is reasonably good.  No masses lymphadenopathy or skin changes noted in the back region.  Specialty Comments:  No specialty comments available.  Imaging: No results found.   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   Genital herpes 12/14/2019   Severe recurrent major depression w/psychotic features, mood-congruent (HCC) 10/04/2018   Transgender    Pleural effusion, bacterial 11/23/2017   Loculated pleural effusion 11/23/2017   Chronic hepatitis C without hepatic coma (HCC) 11/19/2017   Septic pulmonary embolism (HCC) 11/19/2017   IVDU (intravenous drug user)    Endocarditis, suspected 11/18/2017   HIV (human immunodeficiency virus infection) (HCC) 04/07/2016   Cigarette smoker 04/07/2016   Polysubstance abuse (HCC) 04/07/2016   Depression 08/14/2015   Conversion disorder 08/14/2015   Ileitis 08/12/2015   Hemorrhoids, internal, with bleeding 08/25/2011   Anal warts 08/25/2011   Chronic abdominal pain 11/07/2010   ANXIETY 09/24/2010   ADHD 09/24/2010   Altered bowel habits 09/24/2010   Past Medical History:  Diagnosis Date   ADHD (attention deficit hyperactivity disorder)    Anal warts    Anxiety    Asthma    Bipolar disorder (HCC)    Chronic bronchitis (HCC)    Chronic pain syndrome    Convulsions (HCC) 08/14/2015   Depression    Gonorrhea 09/08/11   Headache    weekly (11/06/2016)   Hemorrhoids, internal, with bleeding 08/25/2011   Hepatitis C    Herpes    HIV infection (HCC)    Hypertension    ILEITIS 09/24/2010   Qualifier: Diagnosis of  By: Marcelo CMA (AAMA), Dottie     Lymphoid hyperplasia, reactive    Marijuana abuse    Migraine    monthly (11/06/2016)   Pseudoseizures    Seizures (HCC)    don't know why I have them (11/06/2016)   Syphilis     Family History  Problem Relation Age of Onset   Diabetes Mother    Irritable bowel syndrome Mother    Hypertension Mother    Hyperlipidemia Mother    Hypothyroidism Mother    Diabetes Maternal Uncle    Heart disease Maternal Grandmother    Leukemia Other        maternal  greatgrandmother   Colon cancer Neg Hx     Past Surgical History:  Procedure Laterality Date   ANKLE SURGERY Bilateral 2005   Screw & Pins   ANKLE SURGERY Right 2008   replaced pins & screws   FOOT SURGERY Bilateral 2005   bone spurs   I & D EXTREMITY Right 06/26/2016   Procedure: IRRIGATION AND DEBRIDEMENT RIGHT FOREARM;  Surgeon: Franky Curia, MD;  Location: [facility] OR;  Service: Orthopedics;  Laterality: Right;   I & D EXTREMITY Right 11/04/2016   Procedure: IRRIGATION AND DEBRIDEMENT EXTREMITY;  Surgeon: Donnice Robinsons, MD;  Location: [facility] OR;  Service: Orthopedics;  Laterality: Right;   I & D EXTREMITY Right 11/06/2016   Procedure: IRRIGATION AND DEBRIDEMENT right forarm;  Surgeon: Donnice Robinsons, MD;  Location: [facility] OR;  Service: Orthopedics;  Laterality: Right;   TEE WITHOUT CARDIOVERSION N/A 11/19/2017   Procedure: TRANSESOPHAGEAL ECHOCARDIOGRAM (TEE);  Surgeon: Okey Vina GAILS, MD;  Location: [facility] ENDOSCOPY;  Service: Cardiovascular;  Laterality: N/A;   VIDEO ASSISTED THORACOSCOPY (VATS)/EMPYEMA Left 11/26/2017   Procedure: VIDEO ASSISTED THORACOSCOPY (VATS)/DRAIN EMPYEMA;  Surgeon: Fleeta Hanford Coy, MD;  Location: [facility] OR;  Service: Thoracic;  Laterality: Left;   Social History   Occupational History   Occupation: Production designer, theatre/television/film  Tobacco Use   Smoking status: Every Day    Current packs/day: 1.50    Average packs/day: 1.5 packs/day for 11.0 years (16.5 ttl pk-yrs)    Types: Cigarettes    Passive exposure: Current   Smokeless tobacco: Never  Vaping Use   Vaping status: Never Used  Substance and Sexual Activity   Alcohol use: Yes    Comment: occ   Drug use: Not Currently    Frequency: 2.0 times per week    Types: Cocaine, Benzodiazepines, Oxycodone , Marijuana, Heroin    Comment: just marijuana    Sexual activity: Not Currently    Partners: Male    Birth control/protection: Condom    Comment: declined condoms

## 2024-04-13 ENCOUNTER — Telehealth: Payer: Self-pay | Admitting: Orthopedic Surgery

## 2024-04-13 ENCOUNTER — Other Ambulatory Visit

## 2024-04-13 NOTE — Telephone Encounter (Signed)
 Pt request something for pain,states the methocarbamol  is not helping. [facility]

## 2024-04-14 ENCOUNTER — Inpatient Hospital Stay: Admission: RE | Admit: 2024-04-14 | Source: Ambulatory Visit

## 2024-04-17 ENCOUNTER — Encounter: Admitting: Podiatry

## 2024-04-17 ENCOUNTER — Other Ambulatory Visit: Payer: Self-pay | Admitting: Surgical

## 2024-04-17 MED ORDER — GABAPENTIN 100 MG PO CAPS: 100.0000 mg | ORAL_CAPSULE | Freq: Three times a day (TID) | ORAL | 2 refills | Status: AC

## 2024-04-17 MED ORDER — CYCLOBENZAPRINE HCL 5 MG PO TABS: 5.0000 mg | ORAL_TABLET | Freq: Three times a day (TID) | ORAL | 0 refills | Status: DC | PRN

## 2024-04-17 NOTE — Telephone Encounter (Signed)
 I called to advise. Voicemail is not set up yet.

## 2024-04-17 NOTE — Telephone Encounter (Signed)
 I do not think any opioid pain medicine is indicated at this point.  I did send in prescription for Flexeril to take instead of Robaxin  to see if this will be more helpful.  Also sent in gabapentin  to see if this will help with some of his radicular leg symptoms.

## 2024-04-17 NOTE — Progress Notes (Signed)
Yeah!!!!

## 2024-04-17 NOTE — Progress Notes (Signed)
Patient did not show for scheduled appointment today.

## 2024-04-19 NOTE — Telephone Encounter (Signed)
 I called, no answer, voicemail is not set up.

## 2024-04-20 NOTE — Telephone Encounter (Signed)
 I have tried to reach patient x 3.  No answer, unable to leave messages due to voicemail not being set up.

## 2024-04-24 ENCOUNTER — Ambulatory Visit
Admission: RE | Admit: 2024-04-24 | Discharge: 2024-04-24 | Disposition: A | Discharge status: Home / Self Care | Source: Ambulatory Visit | Attending: Orthopedic Surgery | Admitting: Orthopedic Surgery

## 2024-04-24 DIAGNOSIS — M545 Low back pain, unspecified: Secondary | ICD-10-CM

## 2024-04-25 ENCOUNTER — Ambulatory Visit: Payer: Self-pay | Admitting: Orthopedic Surgery

## 2024-04-25 ENCOUNTER — Encounter (HOSPITAL_BASED_OUTPATIENT_CLINIC_OR_DEPARTMENT_OTHER): Payer: Self-pay

## 2024-04-25 NOTE — Progress Notes (Signed)
 Pls f/u luke thx

## 2024-05-15 ENCOUNTER — Ambulatory Visit: Admitting: Surgical

## 2024-05-18 ENCOUNTER — Telehealth: Payer: Self-pay | Admitting: Orthopedic Surgery

## 2024-05-22 ENCOUNTER — Encounter: Payer: Self-pay | Admitting: Infectious Diseases

## 2024-05-22 ENCOUNTER — Ambulatory Visit: Admitting: Infectious Diseases

## 2024-05-22 ENCOUNTER — Other Ambulatory Visit: Payer: Self-pay

## 2024-05-22 VITALS — BP 127/84 | HR 85 | Temp 98.2°F | Wt 175.0 lb

## 2024-05-22 DIAGNOSIS — Z79899 Other long term (current) drug therapy: Secondary | ICD-10-CM | POA: Diagnosis not present

## 2024-05-22 DIAGNOSIS — B182 Chronic viral hepatitis C: Secondary | ICD-10-CM

## 2024-05-22 DIAGNOSIS — F17210 Nicotine dependence, cigarettes, uncomplicated: Secondary | ICD-10-CM

## 2024-05-22 DIAGNOSIS — Z0000 Encounter for general adult medical examination without abnormal findings: Secondary | ICD-10-CM | POA: Insufficient documentation

## 2024-05-22 DIAGNOSIS — B20 Human immunodeficiency virus [HIV] disease: Secondary | ICD-10-CM | POA: Diagnosis present

## 2024-05-22 DIAGNOSIS — Z113 Encounter for screening for infections with a predominantly sexual mode of transmission: Secondary | ICD-10-CM | POA: Insufficient documentation

## 2024-05-22 DIAGNOSIS — Z7185 Encounter for immunization safety counseling: Secondary | ICD-10-CM | POA: Insufficient documentation

## 2024-05-22 MED ORDER — BICTEGRAVIR-EMTRICITAB-TENOFOV 50-200-25 MG PO TABS: 1.0000 | ORAL_TABLET | Freq: Every day | ORAL | 5 refills | Status: AC

## 2024-05-22 NOTE — Patient Instructions (Signed)
 Smoking Cessation: QuitlineNC 1-800-QUIT-NOW 707-701-6721); Espaol: 1-855-Djelo-Ya (1-780-445-4976) [email or web address]-[email or web address].[email or web address]

## 2024-05-22 NOTE — Progress Notes (Signed)
 [address], [place], [place], [address]                                                                  Phn. [phone]; Fax: [phone]                                                                             Date: 05/22/24  Reason for Visit: Routine HIV care.    HPI: Pedro Owens is a 33 y.o.old male with a history of neurosyphilis s/p tx, HCV, Genital Herpes, anal warts, asthma, anxiety/depression/adhd/bipolar d/o, seizures/pseudoseizures, chronic pain syndrome, h/o substance use, HIV who is here for HIV fu. Patient followed by Dr Luiz OP and was last seen 05/06/23. I saw him in the hospital when he was hospitalized in April for perirectal cellulitis  Interval hx/current visit: Seen by podiatry on 4/28 for congenital pes planus of b/l feet and Orthopedics on 6/20 for back pain. MRI L spine on 7/8 with degenerative changes.   Reports being compliant with Biktarvy  and missed approximately four days prior to this visit, despite having medication available at home.He is sexually active with random partners but declined STD screening during this visit. He smokes more than a pack of cigarettes daily and is currently not working. Denies mental health concerns. Declined vaccines. No other complaints voiced.   ROS: As stated in above HPI; all other systems were reviewed and are otherwise negative unless noted below  No reported fever / chills, night sweats, unintentional weight loss, acute visual change, odynophagia, chest pain/pressure, new or worsened SOB or WOB, nausea, vomiting, diarrhea, dysuria, GU discharge, syncope, seizures, red/hot swollen joints, hallucinations / delusions, rashes, new allergies, unusual / excessive bleeding, swollen lymph nodes, or new hospitalizations/ED  visits/Urgent Care visits since the pt was last seen.  PMH/ PSH/ FamHx / Social Hx , medications and allergies reviewed and updated as appropriate; please see corresponding tab in EHR / prior notes  Current Outpatient Medications on File Prior to Visit  Medication Sig Dispense Refill   cyclobenzaprine  (FLEXERIL ) 5 MG tablet Take 1 tablet (5 mg total) by mouth 3 (three) times daily as needed for muscle spasms. (Patient not taking: Reported on 05/22/2024) 30 tablet 0   gabapentin  (NEURONTIN ) 100 MG capsule Take 1-2 capsules (100-200 mg total) by mouth 3 (three) times daily. (Patient not taking: Reported on 05/22/2024) 90 capsule 2   lurasidone  (LATUDA ) 40 MG TABS tablet Take 40 mg by mouth daily. (Patient not taking: Reported on 05/22/2024)     [DISCONTINUED] divalproex  (DEPAKOTE ) 500 MG DR tablet Take 1 tablet (500 mg total) by mouth 2 (two) times  daily. 60 tablet 2   No current facility-administered medications on file prior to visit.                                         Allergies  Allergen Reactions   Acetaminophen  Diarrhea, Nausea And Vomiting and Other (See Comments)    Flares up crohns disease    Vicodin [Hydrocodone -Acetaminophen ] Nausea And Vomiting   Dilaudid [Hydromorphone Hcl] Hives and Rash   Past Medical History:  Diagnosis Date   ADHD (attention deficit hyperactivity disorder)    Anal warts    Anxiety    Asthma    Bipolar disorder (HCC)    Chronic bronchitis (HCC)    Chronic pain syndrome    Convulsions (HCC) 08/14/2015   Depression    Gonorrhea 09/08/11   Headache    weekly (11/06/2016)   Hemorrhoids, internal, with bleeding 08/25/2011   Hepatitis C    Herpes    HIV infection (HCC)    Hypertension    ILEITIS 09/24/2010   Qualifier: Diagnosis of  By: Marcelo CMA (AAMA), Dottie     Lymphoid hyperplasia, reactive    Marijuana abuse    Migraine    monthly (11/06/2016)   Pseudoseizures    Seizures (HCC)    don't know why I have them (11/06/2016)   Syphilis     Past Surgical History:  Procedure Laterality Date   ANKLE SURGERY Bilateral 2005   Screw & Pins   ANKLE SURGERY Right 2008   replaced pins & screws   FOOT SURGERY Bilateral 2005   bone spurs   I & D EXTREMITY Right 06/26/2016   Procedure: IRRIGATION AND DEBRIDEMENT RIGHT FOREARM;  Surgeon: Franky Curia, MD;  Location: [facility] OR;  Service: Orthopedics;  Laterality: Right;   I & D EXTREMITY Right 11/04/2016   Procedure: IRRIGATION AND DEBRIDEMENT EXTREMITY;  Surgeon: Donnice Robinsons, MD;  Location: [facility] OR;  Service: Orthopedics;  Laterality: Right;   I & D EXTREMITY Right 11/06/2016   Procedure: IRRIGATION AND DEBRIDEMENT right forarm;  Surgeon: Donnice Robinsons, MD;  Location: [facility] OR;  Service: Orthopedics;  Laterality: Right;   TEE WITHOUT CARDIOVERSION N/A 11/19/2017   Procedure: TRANSESOPHAGEAL ECHOCARDIOGRAM (TEE);  Surgeon: Okey Vina GAILS, MD;  Location: [facility] ENDOSCOPY;  Service: Cardiovascular;  Laterality: N/A;   VIDEO ASSISTED THORACOSCOPY (VATS)/EMPYEMA Left 11/26/2017   Procedure: VIDEO ASSISTED THORACOSCOPY (VATS)/DRAIN EMPYEMA;  Surgeon: Fleeta [place] Coy, MD;  Location: [facility] OR;  Service: Thoracic;  Laterality: Left;   Social History   Socioeconomic History   Marital status: Single    Spouse name: Not on file   Number of children: 0   Years of education: Not on file   Highest education level: Not on file  Occupational History   Occupation: Production designer, theatre/television/film  Tobacco Use   Smoking status: Every Day    Current packs/day: 1.50    Average packs/day: 1.5 packs/day for 11.0 years (16.5 ttl pk-yrs)    Types: Cigarettes    Passive exposure: Current   Smokeless tobacco: Never  Vaping Use   Vaping status: Never Used  Substance and Sexual Activity   Alcohol use: Yes    Comment: occ   Drug use: Not Currently    Frequency: 2.0 times per week    Types: Cocaine, Benzodiazepines, Oxycodone , Marijuana, Heroin    Comment: just marijuana    Sexual activity: Not Currently    Partners: Male  Birth control/protection: Condom    Comment: declined condoms  Other Topics Concern   Not on file  Social History Narrative   ** Merged History Encounter **       Social Drivers of Corporate investment banker Strain: Not on file  Food Insecurity: No Food Insecurity (01/31/2024)   Hunger Vital Sign    Worried About Running Out of Food in the Last Year: Never true    Ran Out of Food in the Last Year: Never true  Transportation Needs: No Transportation Needs (01/31/2024)   PRAPARE - Administrator, Civil Service (Medical): No    Lack of Transportation (Non-Medical): No  Physical Activity: Not on file  Stress: Not on file  Social Connections: Not on file  Intimate Partner Violence: Not At Risk (01/31/2024)   Humiliation, Afraid, Rape, and Kick questionnaire    Fear of Current or Ex-Partner: No    Emotionally Abused: No    Physically Abused: No    Sexually Abused: No   Family History  Problem Relation Age of Onset   Diabetes Mother    Irritable bowel syndrome Mother    Hypertension Mother    Hyperlipidemia Mother    Hypothyroidism Mother    Diabetes Maternal Uncle    Heart disease Maternal Grandmother    Leukemia Other        maternal greatgrandmother   Colon cancer Neg Hx    Vitals  Wt 175 lb (79.4 kg)   BMI 24.07 kg/m    Examination  Gen: no acute distress HEENT: [place]/AT, no scleral icterus, no pale conjunctivae, hearing normal, oral mucosa moist Neck: Supple Cardio: Regular rate and rhythm Resp: Pulmonary effort normal in room air GI: nondistended GU: Musc: Extremities: No pedal edema Skin: No rashes Neuro: grossly non focal , awake, alert and oriented * 3  Psych: Calm, cooperative  Lab Results HIV 1 RNA Quant  Date Value  02/04/2024 1,880 copies/mL  11/17/2023 284 Copies/mL (H)  09/22/2023 60 copies/mL   CD4 T Cell Abs (/uL)  Date Value  01/31/2024 346 (L)  11/17/2023 834  09/22/2023 475   Lab Results  Component Value Date    HIV1GENOSEQ SEE NOTE 01/06/2017   Lab Results  Component Value Date   WBC 11.0 (H) 02/07/2024   HGB 16.2 02/07/2024   HCT 47.5 02/07/2024   MCV 86.7 02/07/2024   PLT 404 (H) 02/07/2024    Lab Results  Component Value Date   CREATININE 1.05 02/07/2024   BUN 19 02/07/2024   NA 135 02/07/2024   K 4.2 02/07/2024   CL 104 02/07/2024   CO2 25 02/07/2024   Lab Results  Component Value Date   ALT 84 (H) 02/07/2024   AST 77 (H) 02/07/2024   GGT 323 (H) 05/06/2023   ALKPHOS 61 02/07/2024   BILITOT 0.4 02/07/2024    Lab Results  Component Value Date   CHOL 132 02/12/2022   TRIG 266 (H) 02/12/2022   HDL 42 02/12/2022   LDLCALC 59 02/12/2022   Lab Results  Component Value Date   HAV REACTIVE (A) 03/26/2016   Lab Results  Component Value Date   HEPBSAG NON-REACTIVE 03/03/2023   HEPBSAB NEG 03/26/2016   Lab Results  Component Value Date   HCVAB >11.0 (H) 11/16/2017   Lab Results  Component Value Date   CHLAMYDIAWP Negative 02/03/2024   CHLAMYDIAWP Negative 02/03/2024   N Negative 02/03/2024   N Negative 02/03/2024   No results found for: GCPROBEAPT  Lab Results  Component Value Date   QUANTGOLD NEGATIVE 03/26/2016    Health Maintenance: Immunization History  Administered Date(s) Administered   HIB (PRP-OMP) 07/22/1992, 11/08/1992, 01/03/1993, 07/13/1994   Hepatitis A 05/11/2008, 07/16/2009   Hepatitis B 07/22/1992, 11/08/1992, 01/03/1993   Hepatitis B, ADULT 04/16/2016   Hepb-cpg 04/13/2023, 05/06/2023   IPV 07/22/1992, 11/08/1992, 01/03/1993, 05/12/1996   Influenza Nasal 07/16/2009   Influenza,inj,Quad PF,6+ Mos 07/02/2016, 11/16/2017, 08/15/2019, 11/05/2020   MMR 07/13/1994, 05/12/1996   Meningococcal Conjugate 05/11/2008   PPD Test 03/26/2016   Pneumococcal Polysaccharide-23 03/26/2016, 11/16/2017   Tdap 05/11/2008   Varicella 05/11/2008, 07/16/2009   Imaging  04/25/24 MRI L spine  1. Lumbar spondylosis and degenerative disc disease,  causing moderate left foraminal stenosis at L5-S1 and borderline central narrowing of the thecal sac at L4-5. 2. 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 3. Chronic Schmorl's node along the inferior endplate of L4.   Assessment/Plan: # HIV - discussed elevated VL in April 2025. Appears to be not taking ART when seen by Pharmacy in 02/14/24 and encouraged importance of compliance. He has no barriers to treatment.   - labs today  - fu in 6 weeks with Dr Luiz   # Chronic Hepatitis C - HCV RNA today  # H/o syphilis - RPR today   # Smoking  - reports smoking more than 1 pack a day - counseled   # STD Screening  - Refused screening   #Immunization  - declined vaccines   #Health maintenance - lipid panel  Patient's labs were reviewed as well as his previous records. Patients questions were addressed and answered. Safe sex counseling done.  I spent 37 minutes involved in face-to-face and non-face-to-face activities for this patient on the day of the visit. Professional time spent includes the following activities: Preparing to see the patient (review of tests), Obtaining and reviewing separately obtained history (discharge record 4/21, Podiatry note 5/14 and Ortho note 6/20), Performing a medically appropriate examination and evaluation , Ordering medications/labs, Documenting clinical information in the EMR, Independently interpreting results (not separately reported), Communicating results to the patient, Counseling and educating the patient.   Of note, portions of this note may have been created with voice recognition software. While this note has been edited for accuracy, occasional wrong-word or 'sound-a-like' substitutions may have occurred due to the inherent limitations of voice recognition software.   Electronically signed by:  Annalee Orem, MD Infectious Disease Physician [facility] [address] [place], [place] [address] Phone: [phone]  Fax: [phone]

## 2024-05-23 LAB — T-HELPER CELLS (CD4) COUNT (NOT AT ARMC)
CD4 % Helper T Cell: 35 % (ref 33–65)
CD4 T Cell Abs: 688 /uL (ref 400–1790)

## 2024-05-24 ENCOUNTER — Other Ambulatory Visit: Payer: Self-pay | Admitting: Pharmacist

## 2024-05-24 DIAGNOSIS — B20 Human immunodeficiency virus [HIV] disease: Secondary | ICD-10-CM

## 2024-05-25 LAB — CBC
HCT: 48.1 % (ref 38.5–50.0)
Hemoglobin: 16.3 g/dL (ref 13.2–17.1)
MCH: 30.1 pg (ref 27.0–33.0)
MCHC: 33.9 g/dL (ref 32.0–36.0)
MCV: 88.7 fL (ref 80.0–100.0)
MPV: 10.3 fL (ref 7.5–12.5)
Platelets: 332 Thousand/uL (ref 140–400)
RBC: 5.42 Million/uL (ref 4.20–5.80)
RDW: 12.7 % (ref 11.0–15.0)
WBC: 8 Thousand/uL (ref 3.8–10.8)

## 2024-05-25 LAB — COMPREHENSIVE METABOLIC PANEL WITH GFR
AG Ratio: 1.3 (calc) (ref 1.0–2.5)
ALT: 79 U/L — ABNORMAL HIGH (ref 9–46)
AST: 66 U/L — ABNORMAL HIGH (ref 10–40)
Albumin: 4 g/dL (ref 3.6–5.1)
Alkaline phosphatase (APISO): 85 U/L (ref 36–130)
BUN: 19 mg/dL (ref 7–25)
CO2: 27 mmol/L (ref 20–32)
Calcium: 9.4 mg/dL (ref 8.6–10.3)
Chloride: 105 mmol/L (ref 98–110)
Creat: 1.15 mg/dL (ref 0.60–1.26)
Globulin: 3.1 g/dL (ref 1.9–3.7)
Glucose, Bld: 102 mg/dL — ABNORMAL HIGH (ref 65–99)
Potassium: 3.8 mmol/L (ref 3.5–5.3)
Sodium: 140 mmol/L (ref 135–146)
Total Bilirubin: 0.4 mg/dL (ref 0.2–1.2)
Total Protein: 7.1 g/dL (ref 6.1–8.1)
eGFR: 87 mL/min/1.73m2 (ref >=60)

## 2024-05-25 LAB — RPR: RPR Ser Ql: REACTIVE — AB

## 2024-05-25 LAB — RPR TITER: RPR Titer: 1:8 {titer} — ABNORMAL HIGH

## 2024-05-25 LAB — LIPID PANEL
Cholesterol: 159 mg/dL (ref <200)
HDL: 37 mg/dL — ABNORMAL LOW (ref >=40)
Non-HDL Cholesterol (Calc): 122 mg/dL (ref <130)
Total CHOL/HDL Ratio: 4.3 (calc) (ref <5.0)
Triglycerides: 583 mg/dL — ABNORMAL HIGH (ref <150)

## 2024-05-25 LAB — HIV RNA, RTPCR W/R GT (RTI, PI,INT)
HIV 1 RNA Quant: 31 {copies}/mL — ABNORMAL HIGH
HIV-1 RNA Quant, Log: 1.49 {Log_copies}/mL — ABNORMAL HIGH

## 2024-05-25 LAB — HEPATITIS C RNA QUANTITATIVE
HCV Quantitative Log: 7.37 {Log_IU}/mL — ABNORMAL HIGH
HCV RNA, PCR, QN: 23400000 [IU]/mL — ABNORMAL HIGH

## 2024-05-25 LAB — T PALLIDUM AB: T Pallidum Abs: POSITIVE — AB

## 2024-05-26 ENCOUNTER — Telehealth: Payer: Self-pay | Admitting: Surgical

## 2024-05-26 ENCOUNTER — Other Ambulatory Visit: Payer: Self-pay | Admitting: Surgical

## 2024-05-26 NOTE — Telephone Encounter (Signed)
 I left voicemail advising. ?

## 2024-05-26 NOTE — Telephone Encounter (Signed)
 Pt mom  cam to desk asking for pain medication until pt can come for appt for MRI. Please send meds to [facility]. Pt phone number is [phone].

## 2024-05-26 NOTE — Telephone Encounter (Signed)
 I think in this case where patient has history of IV drug abuse and I have not met this patient, I would hold off on any pain medication.  Would recommend he continue with over-the-counter pain medication as well as ice and heat and stretching and then we can figure it out when he comes in for his appointment next Friday.

## 2024-05-29 ENCOUNTER — Ambulatory Visit: Payer: Self-pay | Admitting: Infectious Diseases

## 2024-06-02 ENCOUNTER — Ambulatory Visit (INDEPENDENT_AMBULATORY_CARE_PROVIDER_SITE_OTHER): Admitting: Surgical

## 2024-06-02 DIAGNOSIS — M545 Low back pain, unspecified: Secondary | ICD-10-CM | POA: Diagnosis not present

## 2024-06-02 MED ORDER — DICLOFENAC SODIUM 75 MG PO TBEC: 75.0000 mg | DELAYED_RELEASE_TABLET | Freq: Two times a day (BID) | ORAL | 0 refills | Status: AC

## 2024-06-02 MED ORDER — TIZANIDINE HCL 2 MG PO CAPS: 2.0000 mg | ORAL_CAPSULE | Freq: Three times a day (TID) | ORAL | 0 refills | Status: DC | PRN

## 2024-06-04 ENCOUNTER — Encounter: Payer: Self-pay | Admitting: Surgical

## 2024-06-04 NOTE — Progress Notes (Signed)
 Office Visit Note   Patient: Pedro Owens           Date of Birth: Feb 27, 1991           MRN: [id number] Visit Date: 06/02/2024 Requested by: No referring provider defined for this encounter. PCP: Pcp, No  Subjective: Chief Complaint  Patient presents with   Lower Back - Follow-up    HPI: Pedro Owens is a 33 y.o. male who presents to the office reporting low back pain.  Patient states he has had years of axial lumbar spine pain.  He denies any leg symptoms or radicular symptoms.  No incontinence or any numbness in his legs.  No saddle anesthesia.  Has occasional paraspinal pain but the vast majority is axial.  He has not tried physical therapy, injections, surgery.  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  He has tried over-the-counter medications, lidocaine  patches, prescription medications without relief.  Here today to review MRI scan..                ROS: All systems reviewed are negative as they relate to the chief complaint within the history of present illness.  Patient denies fevers or chills.  Assessment & Plan: Visit Diagnoses:  1. Low back pain, unspecified back pain laterality, unspecified chronicity, unspecified whether sciatica present     Plan: Impression is 33 year old male who has history of axial lumbar spine pain that has been ongoing for years.  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  Does have some foraminal and central stenosis but no radicular leg symptoms.  Discussed options available to patient.  Plan to try physical therapy for axial lumbar spine pain and he will do this for about 6 weeks.  We can check back in 2 months and see how he is doing and if no improvement, would refer patient to Dr. Georgina for his evaluation of chronic low back pain from chronic pars defects.  For  symptomatic relief, prescribed diclofenac  75 mg twice daily and tizanidine .  Follow-Up Instructions: No follow-ups on file.   Orders:  Orders Placed This Encounter  Procedures   Ambulatory referral to Physical Therapy   Meds ordered this encounter  Medications   diclofenac  (VOLTAREN ) 75 MG EC tablet    Sig: Take 1 tablet (75 mg total) by mouth 2 (two) times daily.    Dispense:  30 tablet    Refill:  0   tizanidine  (ZANAFLEX ) 2 MG capsule    Sig: Take 1 capsule (2 mg total) by mouth every 8 (eight) hours as needed for muscle spasms.    Dispense:  30 capsule    Refill:  0      Procedures: No procedures performed   Clinical Data: No additional findings.  Objective: Vital Signs: There were no vitals taken for this visit.  Physical Exam:  Constitutional: Patient appears well-developed HEENT:  Head: Normocephalic Eyes:EOM are normal Neck: Normal range of motion Cardiovascular: Normal rate Pulmonary/chest: Effort normal Neurologic: Patient is alert Skin: Skin is warm Psychiatric: Patient has normal mood and affect  Ortho Exam: Ortho exam demonstrates lumbar spine with axial tenderness around the level of L4-L5 and L5-S1.  He has no paraspinal tenderness.  Excellent strength of hip flexion, quadricep, hamstring, dorsiflexion, plantarflexion, EHL rated 5/5 bilaterally.  No clonus noted bilaterally.  Negative straight leg raise bilaterally.  Specialty Comments:  No specialty comments available.  Imaging: No results found.   PMFS History: Patient Active Problem List   Diagnosis Date Noted   HIV disease (HCC) 05/22/2024   Screening for venereal disease (VD) 05/22/2024   Medication management 05/22/2024   Health care maintenance 05/22/2024   Vaccine counseling 05/22/2024   Congenital pes planus of right foot 03/01/2024   Congenital pes planus of left foot 03/01/2024   Furuncle of scalp 02/01/2024   Group A streptococcal infection 02/01/2024   Positive blood culture  02/01/2024   Perirectal cellulitis 01/31/2024   Sepsis (HCC) 01/31/2024   Facial abscess 09/22/2023   Facial cellulitis 09/21/2023   Syphilis 03/26/2023   Multifocal pneumonia 08/18/2022   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   Genital herpes 12/14/2019   Severe recurrent major depression w/psychotic features, mood-congruent (HCC) 10/04/2018   Transgender    Pleural effusion, bacterial 11/23/2017   Loculated pleural effusion 11/23/2017   Chronic hepatitis C without hepatic coma (HCC) 11/19/2017   Septic pulmonary embolism (HCC) 11/19/2017   IVDU (intravenous drug user)    Endocarditis, suspected 11/18/2017   HIV (human immunodeficiency virus infection) (HCC) 04/07/2016   Cigarette smoker 04/07/2016   Polysubstance abuse (HCC) 04/07/2016   Depression 08/14/2015   Conversion disorder 08/14/2015   Ileitis 08/12/2015   Hemorrhoids, internal, with bleeding 08/25/2011   Anal warts 08/25/2011   Chronic abdominal pain 11/07/2010   ANXIETY 09/24/2010   ADHD 09/24/2010   Altered bowel habits 09/24/2010   Past Medical History:  Diagnosis Date   ADHD (attention deficit hyperactivity disorder)    Anal warts    Anxiety    Asthma    Bipolar disorder (HCC)    Chronic bronchitis (HCC)    Chronic pain syndrome    Convulsions (HCC) 08/14/2015   Depression    Gonorrhea 09/08/11   Headache    weekly (11/06/2016)   Hemorrhoids, internal, with bleeding 08/25/2011   Hepatitis C    Herpes    HIV infection (HCC)    Hypertension    ILEITIS 09/24/2010   Qualifier: Diagnosis of  By: Marcelo CMA (AAMA), Dottie     Lymphoid hyperplasia, reactive    Marijuana abuse    Migraine    monthly (11/06/2016)   Pseudoseizures    Seizures (HCC)    don't know why I have them (11/06/2016)   Syphilis     Family History  Problem Relation Age of Onset   Diabetes Mother    Irritable bowel syndrome Mother    Hypertension Mother    Hyperlipidemia Mother    Hypothyroidism Mother    Diabetes Maternal  Uncle    Heart disease Maternal Grandmother    Leukemia Other        maternal greatgrandmother   Colon cancer Neg Hx     Past Surgical History:  Procedure Laterality Date   ANKLE SURGERY Bilateral 2005   Screw & Pins   ANKLE SURGERY Right 2008   replaced pins & screws   FOOT SURGERY Bilateral 2005   bone spurs   I & D EXTREMITY Right 06/26/2016   Procedure: IRRIGATION AND DEBRIDEMENT RIGHT FOREARM;  Surgeon: Franky Curia, MD;  Location: [facility] OR;  Service: Orthopedics;  Laterality: Right;   I & D EXTREMITY Right 11/04/2016   Procedure: IRRIGATION AND DEBRIDEMENT EXTREMITY;  Surgeon: Donnice Robinsons, MD;  Location: [facility] OR;  Service: Orthopedics;  Laterality: Right;   I & D EXTREMITY Right 11/06/2016   Procedure: IRRIGATION  AND DEBRIDEMENT right forarm;  Surgeon: Donnice Robinsons, MD;  Location: [facility] OR;  Service: Orthopedics;  Laterality: Right;   TEE WITHOUT CARDIOVERSION N/A 11/19/2017   Procedure: TRANSESOPHAGEAL ECHOCARDIOGRAM (TEE);  Surgeon: Okey Vina GAILS, MD;  Location: [facility] ENDOSCOPY;  Service: Cardiovascular;  Laterality: N/A;   VIDEO ASSISTED THORACOSCOPY (VATS)/EMPYEMA Left 11/26/2017   Procedure: VIDEO ASSISTED THORACOSCOPY (VATS)/DRAIN EMPYEMA;  Surgeon: Fleeta Hanford Coy, MD;  Location: [facility] OR;  Service: Thoracic;  Laterality: Left;   Social History   Occupational History   Occupation: Production designer, theatre/television/film  Tobacco Use   Smoking status: Every Day    Current packs/day: 1.50    Average packs/day: 1.5 packs/day for 11.0 years (16.5 ttl pk-yrs)    Types: Cigarettes    Passive exposure: Current   Smokeless tobacco: Never  Vaping Use   Vaping status: Never Used  Substance and Sexual Activity   Alcohol use: Yes    Comment: occ   Drug use: Not Currently    Frequency: 2.0 times per week    Types: Cocaine, Benzodiazepines, Oxycodone , Marijuana, Heroin    Comment: just marijuana    Sexual activity: Not Currently    Partners: Male    Birth control/protection: Condom    Comment: declined  condoms

## 2024-06-06 ENCOUNTER — Telehealth: Payer: Self-pay | Admitting: Surgical

## 2024-06-06 NOTE — Telephone Encounter (Signed)
 Pt asking for Her[facility] to send a referral for MRI. Pt phone number is [phone].

## 2024-06-07 ENCOUNTER — Ambulatory Visit: Admitting: Orthopedic Surgery

## 2024-06-07 NOTE — Telephone Encounter (Signed)
 I left voicemail for patient requesting return call-unsure what he is needing from [facility]. Patient has had MRI.

## 2024-06-09 ENCOUNTER — Ambulatory Visit: Admitting: Surgical

## 2024-06-09 NOTE — Telephone Encounter (Signed)
 Patient did not return call to me.  He is on the schedule for appointment with [facility] this afternoon.

## 2024-06-22 ENCOUNTER — Telehealth: Payer: Self-pay

## 2024-06-22 DIAGNOSIS — B009 Herpesviral infection, unspecified: Secondary | ICD-10-CM

## 2024-06-22 MED ORDER — VALACYCLOVIR HCL 1 G PO TABS: ORAL_TABLET | ORAL | 0 refills | Status: AC

## 2024-06-22 NOTE — Telephone Encounter (Signed)
 Patient's mother, Nolon [facility]), called and requested Rx for Valtrex  be sent to [facility] on [address].   States he used to take it as needed, but he is having more frequent outbreaks and would like him to have a daily Rx.   Will route to provider.   Lorea Kupfer, BSN, RN

## 2024-06-22 NOTE — Telephone Encounter (Signed)
 Per Dr. Luiz: valtrex  1gm TID for outbreak x 7 days then 1gm daily for prophylaxis   Rx sent, called patient's mom to notify her, no answer. Left HIPAA compliant generic voicemail stating that medication was sent and to call with any questions.   Camrin Lapre, BSN, RN

## 2024-06-22 NOTE — Addendum Note (Signed)
 Addended by: FLORENE BOUCHARD D on: 06/22/2024 01:55 PM   Modules accepted: Orders

## 2024-06-28 ENCOUNTER — Emergency Department (HOSPITAL_BASED_OUTPATIENT_CLINIC_OR_DEPARTMENT_OTHER)
Admission: EM | Admit: 2024-06-28 | Discharge: 2024-06-28 | Disposition: A | Discharge status: Home / Self Care | Attending: Emergency Medicine | Admitting: Emergency Medicine

## 2024-06-28 ENCOUNTER — Other Ambulatory Visit: Payer: Self-pay

## 2024-06-28 DIAGNOSIS — Z21 Asymptomatic human immunodeficiency virus [HIV] infection status: Secondary | ICD-10-CM | POA: Diagnosis not present

## 2024-06-28 DIAGNOSIS — R21 Rash and other nonspecific skin eruption: Secondary | ICD-10-CM | POA: Insufficient documentation

## 2024-06-28 NOTE — Discharge Instructions (Signed)
 It was a pleasure taking care of you today.  As discussed, continue taking your Valtrex  until evaluated by infectious disease.  Call infectious disease today to schedule an appointment for next week to go over your lab results.  Return to the ER for any new or worsening symptoms.

## 2024-06-28 NOTE — ED Notes (Signed)
 DC paperwork given and verbally understood.

## 2024-06-28 NOTE — ED Triage Notes (Signed)
 Pt caox4 ambulatory c/o genital rash stating he has PMH genital herpes and has used valtrex  with improvement in s/s. Pt states last time he had a rash like this that didn't get better with Valtrex  it was syphilis.

## 2024-06-28 NOTE — ED Provider Notes (Signed)
 [place] [facility] AT [facility] Provider Note   CSN: [id number] Arrival date & time: 06/28/24  1321     Patient presents with: Rash   Pedro Owens is a 33 y.o. male with a past medical history significant for HIV, genital herpes, chronic hepatitis C, history of neurosyphilis, history of anal warts, chronic pain syndrome, history of substance abuse who presents to the ED due to rash to genital region.  Patient states he has a history of genital herpes.  Has been taking Valtrex  for 1.5 weeks for lesions however, notes lesions have not improved (typically does when he has a herpes outbreak).  Similar scenario in the past where he thought it was herpes however, was actually syphilis. Saw ID in August where labs were drawn. Compliant with his HIV medication. Denies fever or chills. Notes sores are painful. No difficulties with bowel movements. No other complaints.   History obtained from patient and past medical records. No interpreter used during encounter.       Prior to Admission medications   Medication Sig Start Date End Date Taking? Authorizing Provider  bictegravir-emtricitabine -tenofovir  AF (BIKTARVY ) 50-200-25 MG TABS tablet Take 1 tablet by mouth daily. 05/22/24   Manandhar, Sabina, MD  cyclobenzaprine  (FLEXERIL ) 5 MG tablet Take 1 tablet (5 mg total) by mouth 3 (three) times daily as needed for muscle spasms. Patient not taking: Reported on 05/22/2024 04/17/24   Magnant, Carlin LITTIE, PA-C  diclofenac  (VOLTAREN ) 75 MG EC tablet Take 1 tablet (75 mg total) by mouth 2 (two) times daily. 06/02/24   Magnant, Charles L, PA-C  gabapentin  (NEURONTIN ) 100 MG capsule Take 1-2 capsules (100-200 mg total) by mouth 3 (three) times daily. Patient not taking: Reported on 05/22/2024 04/17/24 04/17/25  Magnant, Carlin LITTIE, PA-C  lurasidone  (LATUDA ) 40 MG TABS tablet Take 40 mg by mouth daily. Patient not taking: Reported on 05/22/2024 12/03/23   [provider]  tizanidine   (ZANAFLEX ) 2 MG capsule Take 1 capsule (2 mg total) by mouth every 8 (eight) hours as needed for muscle spasms. 06/02/24   Magnant, Carlin LITTIE, PA-C  valACYclovir  (VALTREX ) 1000 MG tablet Take 1 tablet (1,000 mg total) by mouth 3 (three) times daily for 7 days, THEN 1 tablet (1,000 mg total) daily for 21 days. 06/22/24 07/20/24  Luiz Channel, MD  divalproex  (DEPAKOTE ) 500 MG DR tablet Take 1 tablet (500 mg total) by mouth 2 (two) times daily. 02/28/16 02/28/16  Benigno Mallick, MD    Allergies: Acetaminophen , Vicodin [hydrocodone -acetaminophen ], and Dilaudid [hydromorphone hcl]    Review of Systems  Constitutional:  Negative for chills and fever.  Skin:  Positive for rash.    Updated Vital Signs BP (!) 149/99   Pulse 62   Temp 97.6 F (36.4 C) (Oral)   Resp 20   Ht 5' 11.5 (1.816 m)   Wt 82.6 kg   SpO2 98%   BMI 25.03 kg/m   Physical Exam Vitals and nursing note reviewed.  Constitutional:      General: He is not in acute distress.    Appearance: He is not ill-appearing.  HENT:     Head: Normocephalic.  Eyes:     Pupils: Pupils are equal, round, and reactive to light.  Cardiovascular:     Rate and Rhythm: Normal rate and regular rhythm.     Pulses: Normal pulses.     Heart sounds: Normal heart sounds. No murmur heard.    No friction rub. No gallop.  Pulmonary:  Effort: Pulmonary effort is normal.     Breath sounds: Normal breath sounds.  Abdominal:     General: Abdomen is flat. There is no distension.     Palpations: Abdomen is soft.     Tenderness: There is no abdominal tenderness. There is no guarding or rebound.  Genitourinary:    Comments: GU exam performed with chaperone in room.  Numerous ulcerated lesions to rectal region. Musculoskeletal:        General: Normal range of motion.     Cervical back: Neck supple.  Skin:    General: Skin is warm and dry.  Neurological:     General: No focal deficit present.     Mental Status: He is alert.  Psychiatric:         Mood and Affect: Mood normal.        Behavior: Behavior normal.     (all labs ordered are listed, but only abnormal results are displayed) Labs Reviewed  HSV CULTURE AND TYPING  VARICELLA-ZOSTER BY PCR  HSV 1 ANTIBODY, IGG  HSV 2 ANTIBODY, IGG  RPR  GC/CHLAMYDIA PROBE AMP ([place]) NOT AT [facility], [place]    EKG: None  Radiology: No results found.   Procedures   Medications Ordered in the ED - No data to display                                  Medical Decision Making Amount and/or Complexity of Data Reviewed External Data Reviewed: notes.    Details: ID notes Labs: ordered. Decision-making details documented in ED Course.   33 year old male presents to the ED due to ulcerated lesions in rectal region and groin.  History of genital herpes and previous syphilis infection.  History of HIV.  Compliant with medication. Has been on Valtrex  for 1.5 weeks with no improvement in lesions. No fever or chills.  No difficulties with bowel movement.  Upon arrival patient afebrile, not tachycardic or hypoxic.  Patient in no acute distress.  Ulcerated lesions to rectal region with are tender to touch. Has a few lesions on scrotum. Discussed with Dr. Overton with ID who recommends obtaining gonorrhea/chlamydia, HSV1/2, RPR, Zoster PCR, and HSV culture. He notes patient should be on valtrex  for at least 3 weeks. Recommends patient follow-up in ID clinic next week to go over results. No evidence of superimposed bacterial infection on exam.  Advised patient to continue taking his Valtrex  as previously prescribed until evaluated by ID.  Also instructed patient to refrain from intercourse until evaluated by ID.  Patient stable for discharge. Strict ED precautions discussed with patient. Patient states understanding and agrees to plan. Patient discharged home in no acute distress and stable vitals     Final diagnoses:  Rash of genital area    ED Discharge Orders     None          Lorelle Aleck JAYSON DEVONNA 06/28/24 1519    Patsey Lot, MD 06/29/24 [phone]

## 2024-06-29 ENCOUNTER — Ambulatory Visit (HOSPITAL_COMMUNITY): Payer: Self-pay

## 2024-06-29 LAB — HSV 2 ANTIBODY, IGG: HSV 2 Glycoprotein G Ab, IgG: REACTIVE — AB

## 2024-06-29 LAB — RPR
RPR Ser Ql: REACTIVE — AB
RPR Titer: >=1:256 {titer}

## 2024-06-29 LAB — HSV 1 ANTIBODY, IGG: HSV 1 Glycoprotein G Ab, IgG: NONREACTIVE

## 2024-06-30 LAB — T.PALLIDUM AB, TOTAL: T Pallidum Abs: REACTIVE — AB

## 2024-06-30 LAB — VARICELLA-ZOSTER BY PCR: Varicella-Zoster, PCR: NEGATIVE

## 2024-07-03 ENCOUNTER — Other Ambulatory Visit: Payer: Self-pay

## 2024-07-03 ENCOUNTER — Encounter: Payer: Self-pay | Admitting: Internal Medicine

## 2024-07-03 ENCOUNTER — Ambulatory Visit (INDEPENDENT_AMBULATORY_CARE_PROVIDER_SITE_OTHER): Admitting: Internal Medicine

## 2024-07-03 VITALS — BP 129/88 | HR 86 | Temp 98.1°F | Wt 174.0 lb

## 2024-07-03 DIAGNOSIS — Z79899 Other long term (current) drug therapy: Secondary | ICD-10-CM | POA: Diagnosis not present

## 2024-07-03 DIAGNOSIS — B20 Human immunodeficiency virus [HIV] disease: Secondary | ICD-10-CM

## 2024-07-03 DIAGNOSIS — B009 Herpesviral infection, unspecified: Secondary | ICD-10-CM

## 2024-07-03 DIAGNOSIS — A539 Syphilis, unspecified: Secondary | ICD-10-CM | POA: Diagnosis not present

## 2024-07-03 DIAGNOSIS — Z113 Encounter for screening for infections with a predominantly sexual mode of transmission: Secondary | ICD-10-CM

## 2024-07-03 LAB — HSV CULTURE AND TYPING

## 2024-07-03 MED ORDER — DOXYCYCLINE HYCLATE 100 MG PO TABS: 100.0000 mg | ORAL_TABLET | Freq: Two times a day (BID) | ORAL | 0 refills | Status: AC

## 2024-07-03 MED ORDER — CEFTRIAXONE SODIUM 500 MG IJ SOLR
500.0000 mg | Freq: Once | INTRAMUSCULAR | Status: AC
  Administered 2024-07-03: 500 mg via INTRAMUSCULAR

## 2024-07-03 MED ORDER — AZITHROMYCIN 250 MG PO TABS
1000.0000 mg | ORAL_TABLET | Freq: Once | ORAL | Status: AC
  Administered 2024-07-03: 1000 mg via ORAL

## 2024-07-03 NOTE — Progress Notes (Unsigned)
 RFV: follow up for hiv disease  Patient ID: Pedro Owens, male   DOB: 05-Aug-1991, 33 y.o.   MRN: [id number]  HPI Pedro Owens is a 33yo M with history of HIV disease, on biktarvy , hx of HSV outbreaks on valtrex . He states that he recently wen to the emergency room for penile discharge and rash. They only checked RPR. His RPR is was elevated > 1:256. Also was at his serostate of 1:8 in August. No other STI was tested nor did they presumptively treat. He was asked to come to clinic for evaluation and management.   Outpatient Encounter Medications as of 07/03/2024  Medication Sig   bictegravir-emtricitabine -tenofovir  AF (BIKTARVY ) 50-200-25 MG TABS tablet Take 1 tablet by mouth daily.   tizanidine  (ZANAFLEX ) 2 MG capsule Take 1 capsule (2 mg total) by mouth every 8 (eight) hours as needed for muscle spasms.   cyclobenzaprine  (FLEXERIL ) 5 MG tablet Take 1 tablet (5 mg total) by mouth 3 (three) times daily as needed for muscle spasms. (Patient not taking: Reported on 07/03/2024)   diclofenac  (VOLTAREN ) 75 MG EC tablet Take 1 tablet (75 mg total) by mouth 2 (two) times daily.   gabapentin  (NEURONTIN ) 100 MG capsule Take 1-2 capsules (100-200 mg total) by mouth 3 (three) times daily. (Patient not taking: Reported on 07/03/2024)   lurasidone  (LATUDA ) 40 MG TABS tablet Take 40 mg by mouth daily. (Patient not taking: Reported on 07/03/2024)   valACYclovir  (VALTREX ) 1000 MG tablet Take 1 tablet (1,000 mg total) by mouth 3 (three) times daily for 7 days, THEN 1 tablet (1,000 mg total) daily for 21 days.   [DISCONTINUED] divalproex  (DEPAKOTE ) 500 MG DR tablet Take 1 tablet (500 mg total) by mouth 2 (two) times daily.   No facility-administered encounter medications on file as of 07/03/2024.     Patient Active Problem List   Diagnosis Date Noted   HIV disease (HCC) 05/22/2024   Screening for venereal disease (VD) 05/22/2024   Medication management 05/22/2024   Health care maintenance 05/22/2024   Vaccine  counseling 05/22/2024   Congenital pes planus of right foot 03/01/2024   Congenital pes planus of left foot 03/01/2024   Furuncle of scalp 02/01/2024   Group A streptococcal infection 02/01/2024   Positive blood culture 02/01/2024   Perirectal cellulitis 01/31/2024   Sepsis (HCC) 01/31/2024   Facial abscess 09/22/2023   Facial cellulitis 09/21/2023   Syphilis 03/26/2023   Multifocal pneumonia 08/18/2022   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   Genital herpes 12/14/2019   Severe recurrent major depression w/psychotic features, mood-congruent (HCC) 10/04/2018   Transgender    Pleural effusion, bacterial 11/23/2017   Loculated pleural effusion 11/23/2017   Chronic hepatitis C without hepatic coma (HCC) 11/19/2017   Septic pulmonary embolism (HCC) 11/19/2017   IVDU (intravenous drug user)    Endocarditis, suspected 11/18/2017   HIV (human immunodeficiency virus infection) (HCC) 04/07/2016   Cigarette smoker 04/07/2016   Polysubstance abuse (HCC) 04/07/2016   Depression 08/14/2015   Conversion disorder 08/14/2015   Ileitis 08/12/2015   Hemorrhoids, internal, with bleeding 08/25/2011   Anal warts 08/25/2011   Chronic abdominal pain 11/07/2010   ANXIETY 09/24/2010   ADHD 09/24/2010   Altered bowel habits 09/24/2010     Health Maintenance Due  Topic Date Due   COVID-19 Vaccine (1) Never done   DTaP/Tdap/Td (2 - Td or Tdap) 05/11/2018   Pneumococcal Vaccine (3 of 3 - PCV) 11/16/2018   HPV VACCINES (1 - Risk 3-dose SCDM series) Never done  Influenza Vaccine  05/19/2024     Review of Systems Rash and drainage as noted above. 12 point ros is otherwise negative Physical Exam   BP 129/88   Pulse 86   Temp 98.1 F (36.7 C) (Oral)   Wt 174 lb (78.9 kg)   SpO2 95%   BMI 23.93 kg/m   Physical Exam  Constitutional: He is oriented to person, place, and time. He appears well-developed and well-nourished. No distress.  HENT:  Mouth/Throat: Oropharynx is clear and moist. No  oropharyngeal exudate.  Cardiovascular: Normal rate, regular rhythm and normal heart sounds. Exam reveals no gallop and no friction rub.  No murmur heard.  Pulmonary/Chest: Effort normal and breath sounds normal. No respiratory distress. He has no wheezes.  Abdominal: Soft. Bowel sounds are normal. He exhibits no distension. There is no tenderness.  Lymphadenopathy:  He has no cervical adenopathy.  Neurological: He is alert and oriented to person, place, and time.  Skin: Skin is warm and dry. No rash noted. No erythema.  Psychiatric: He has a normal mood and affect. His behavior is normal.   Lab Results  Component Value Date   CD4TCELL 35 05/22/2024   Lab Results  Component Value Date   CD4TABS 688 05/22/2024   CD4TABS 346 (L) 01/31/2024   CD4TABS 834 11/17/2023   Lab Results  Component Value Date   HIV1RNAQUANT 31 (H) 05/22/2024   Lab Results  Component Value Date   HEPBSAB NEG 03/26/2016   Lab Results  Component Value Date   LABRPR Reactive (A) 06/28/2024    CBC Lab Results  Component Value Date   WBC 8.0 05/22/2024   RBC 5.42 05/22/2024   HGB 16.3 05/22/2024   HCT 48.1 05/22/2024   PLT 332 05/22/2024   MCV 88.7 05/22/2024   MCH 30.1 05/22/2024   MCHC 33.9 05/22/2024   RDW 12.7 05/22/2024   LYMPHSABS 2.1 01/31/2024   MONOABS 2.7 (H) 01/31/2024   EOSABS 0.0 01/31/2024    BMET Lab Results  Component Value Date   NA 140 05/22/2024   K 3.8 05/22/2024   CL 105 05/22/2024   CO2 27 05/22/2024   GLUCOSE 102 (H) 05/22/2024   BUN 19 05/22/2024   CREATININE 1.15 05/22/2024   CALCIUM 9.4 05/22/2024   GFRNONAA >60 02/07/2024   GFRAA 115 03/12/2021      Assessment and Plan Hiv disease= will check cd 4 count and vl  Symptomatic/penile drainage = concern for gc/chlam. Will presumptively treat with ceftriaxone  and azithro;   Long term medication management = will check cr to see still stable  Syphilis = send in rx for doxy 100mg  po bid x 28d  Hsv   lesions = continue valtrex  treatment at 1gm TID x 7 days  See back in 7-10 dayto see if improved

## 2024-07-04 LAB — C. TRACHOMATIS/N. GONORRHOEAE RNA
C. trachomatis RNA, TMA: NOT DETECTED
N. gonorrhoeae RNA, TMA: DETECTED — AB

## 2024-07-04 LAB — CT/NG RNA, TMA RECTAL
Chlamydia Trachomatis RNA: NOT DETECTED
Neisseria Gonorrhoeae RNA: DETECTED — AB

## 2024-07-04 LAB — GC/CHLAMYDIA PROBE, AMP (THROAT)
Chlamydia trachomatis RNA: NOT DETECTED
Neisseria gonorrhoeae RNA: DETECTED — AB

## 2024-07-05 ENCOUNTER — Telehealth: Payer: Self-pay | Admitting: Surgical

## 2024-07-05 NOTE — Telephone Encounter (Signed)
 CALLED PT AND FAMILY MEMBER WILL HAVE HIM CALL TO R/S WITH LUKE

## 2024-07-06 LAB — T-HELPER CELLS (CD4) COUNT (NOT AT ARMC)
Absolute CD4: 966 {cells}/uL (ref 490–1740)
CD4 T Helper %: 37 % (ref 30–61)
Total lymphocyte count: 2611 {cells}/uL (ref 850–3900)

## 2024-07-06 LAB — HIV-1 RNA QUANT-NO REFLEX-BLD
HIV 1 RNA Quant: <20 {copies}/mL — AB
HIV-1 RNA Quant, Log: <1.3 {Log_copies}/mL — AB

## 2024-07-12 ENCOUNTER — Ambulatory Visit: Admitting: Internal Medicine

## 2024-07-26 ENCOUNTER — Ambulatory Visit: Admitting: Orthopedic Surgery

## 2024-07-28 ENCOUNTER — Ambulatory Visit: Admitting: Surgical

## 2024-07-29 ENCOUNTER — Other Ambulatory Visit: Payer: Self-pay | Admitting: Internal Medicine

## 2024-07-29 DIAGNOSIS — B009 Herpesviral infection, unspecified: Secondary | ICD-10-CM

## 2024-08-16 ENCOUNTER — Ambulatory Visit: Admitting: Orthopedic Surgery

## 2024-08-21 ENCOUNTER — Encounter: Payer: Self-pay | Admitting: Radiology

## 2024-08-28 ENCOUNTER — Ambulatory Visit: Admitting: Podiatry

## 2024-08-29 NOTE — Therapy (Deleted)
 OUTPATIENT PHYSICAL THERAPY THORACOLUMBAR EVALUATION   Patient Name: Pedro Owens MRN: [id number] DOB:23-Mar-1991, 33 y.o., male Today's Date: 08/29/2024  END OF SESSION:   Past Medical History:  Diagnosis Date   ADHD (attention deficit hyperactivity disorder)    Anal warts    Anxiety    Asthma    Bipolar disorder (HCC)    Chronic bronchitis (HCC)    Chronic pain syndrome    Convulsions (HCC) 08/14/2015   Depression    Gonorrhea 09/08/11   Headache    weekly (11/06/2016)   Hemorrhoids, internal, with bleeding 08/25/2011   Hepatitis C    Herpes    HIV infection (HCC)    Hypertension    ILEITIS 09/24/2010   Qualifier: Diagnosis of  By: Marcelo CMA (AAMA), Dottie     Lymphoid hyperplasia, reactive    Marijuana abuse    Migraine    monthly (11/06/2016)   Pseudoseizures    Seizures (HCC)    don't know why I have them (11/06/2016)   Syphilis    Past Surgical History:  Procedure Laterality Date   ANKLE SURGERY Bilateral 2005   Screw & Pins   ANKLE SURGERY Right 2008   replaced pins & screws   FOOT SURGERY Bilateral 2005   bone spurs   I & D EXTREMITY Right 06/26/2016   Procedure: IRRIGATION AND DEBRIDEMENT RIGHT FOREARM;  Surgeon: Franky Curia, MD;  Location: [facility] OR;  Service: Orthopedics;  Laterality: Right;   I & D EXTREMITY Right 11/04/2016   Procedure: IRRIGATION AND DEBRIDEMENT EXTREMITY;  Surgeon: Donnice Robinsons, MD;  Location: [facility] OR;  Service: Orthopedics;  Laterality: Right;   I & D EXTREMITY Right 11/06/2016   Procedure: IRRIGATION AND DEBRIDEMENT right forarm;  Surgeon: Donnice Robinsons, MD;  Location: [facility] OR;  Service: Orthopedics;  Laterality: Right;   TEE WITHOUT CARDIOVERSION N/A 11/19/2017   Procedure: TRANSESOPHAGEAL ECHOCARDIOGRAM (TEE);  Surgeon: Okey Vina GAILS, MD;  Location: [facility] ENDOSCOPY;  Service: Cardiovascular;  Laterality: N/A;   VIDEO ASSISTED THORACOSCOPY (VATS)/EMPYEMA Left 11/26/2017   Procedure: VIDEO ASSISTED THORACOSCOPY (VATS)/DRAIN  EMPYEMA;  Surgeon: Fleeta Hanford Coy, MD;  Location: [facility] OR;  Service: Thoracic;  Laterality: Left;   Patient Active Problem List   Diagnosis Date Noted   HIV disease (HCC) 05/22/2024   Screening for venereal disease (VD) 05/22/2024   Medication management 05/22/2024   Health care maintenance 05/22/2024   Vaccine counseling 05/22/2024   Congenital pes planus of right foot 03/01/2024   Congenital pes planus of left foot 03/01/2024   Furuncle of scalp 02/01/2024   Group A streptococcal infection 02/01/2024   Positive blood culture 02/01/2024   Perirectal cellulitis 01/31/2024   Sepsis (HCC) 01/31/2024   Facial abscess 09/22/2023   Facial cellulitis 09/21/2023   Syphilis 03/26/2023   Multifocal pneumonia 08/18/2022   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   Genital herpes 12/14/2019   Severe recurrent major depression w/psychotic features, mood-congruent (HCC) 10/04/2018   Transgender    Pleural effusion, bacterial 11/23/2017   Loculated pleural effusion 11/23/2017   Chronic hepatitis C without hepatic coma (HCC) 11/19/2017   Septic pulmonary embolism (HCC) 11/19/2017   IVDU (intravenous drug user)    Endocarditis, suspected 11/18/2017   HIV (human immunodeficiency virus infection) (HCC) 04/07/2016   Cigarette smoker 04/07/2016   Polysubstance abuse (HCC) 04/07/2016   Depression 08/14/2015   Conversion disorder 08/14/2015   Ileitis 08/12/2015   Hemorrhoids, internal, with bleeding 08/25/2011   Anal warts 08/25/2011   Chronic abdominal pain 11/07/2010  ANXIETY 09/24/2010   ADHD 09/24/2010   Altered bowel habits 09/24/2010    PCP: Pcp, No   REFERRING PROVIDER: Magnant, Charles L, PA-C  REFERRING DIAG: M54.50 (ICD-10-CM) - Low back pain, unspecified back pain laterality, unspecified chronicity, unspecified whether sciatica present  Rationale for Evaluation and Treatment: Rehabilitation  THERAPY DIAG:  No diagnosis found.  ONSET DATE: chronic  SUBJECTIVE:                                                                                                                                                                                            SUBJECTIVE STATEMENT: HPI: Pedro Owens is a 33 y.o. male who presents to the office reporting low back pain.  Patient states he has had years of axial lumbar spine pain.  He denies any leg symptoms or radicular symptoms.  No incontinence or any numbness in his legs.  No saddle anesthesia.  Has occasional paraspinal pain but the vast majority is axial.  He has not tried physical therapy, injections, surgery.  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  He has tried over-the-counter medications, lidocaine  patches, prescription medications without relief.  Here today to review MRI scan.SABRA    PERTINENT HISTORY:  Plan: Impression is 33 year old male who has history of axial lumbar spine pain that has been ongoing for years.  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  Does have some foraminal and central stenosis but no radicular leg symptoms.  Discussed options available to patient.  Plan to try physical therapy for axial lumbar spine pain and he will do this for about 6 weeks.  We can check back in 2 months and see how he is doing and if no improvement, would refer patient to Dr. Georgina for his evaluation of chronic low back pain from chronic pars defects.  For symptomatic relief, prescribed diclofenac  75 mg twice daily and tizanidine .  PAIN:  Are you having pain? Yes: NPRS scale: *** Pain location: *** Pain description: *** Aggravating factors: *** Relieving factors: ***  PRECAUTIONS: None  RED FLAGS: None   WEIGHT BEARING RESTRICTIONS: No  FALLS:  Has patient fallen in last 6 months? No  OCCUPATION: ***  PLOF: Independent  PATIENT GOALS: To manage  my back pain  NEXT MD VISIT: 09/11/24  OBJECTIVE:  Note: Objective measures were completed at Evaluation unless otherwise noted.  DIAGNOSTIC FINDINGS:  IMPRESSION: 1. Lumbar spondylosis and degenerative disc disease, causing moderate left foraminal  stenosis at L5-S1 and borderline central narrowing of the thecal sac at L4-5. 2. 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 3. Chronic Schmorl's node along the inferior endplate of L4.     Electronically Signed   By: Ryan Salvage M.D.   On: 04/25/2024 09:11    PATIENT SURVEYS:  Oswestry Low Back Pain Disability Questionnaire: {:PHR,OPRCOSWESTRYLBPDISABILITYQQ}   MUSCLE LENGTH: Hamstrings: Right *** deg; Left *** deg Debby test: Right *** deg; Left *** deg  POSTURE: {posture:25561}  PALPATION: ***  LUMBAR ROM:   AROM eval  Flexion   Extension   Right lateral flexion   Left lateral flexion   Right rotation   Left rotation    (Blank rows = not tested)  LOWER EXTREMITY ROM:     {AROM/PROM:27142}  Right eval Left eval  Hip flexion    Hip extension    Hip abduction    Hip adduction    Hip internal rotation    Hip external rotation    Knee flexion    Knee extension    Ankle dorsiflexion    Ankle plantarflexion    Ankle inversion    Ankle eversion     (Blank rows = not tested)  LOWER EXTREMITY MMT:    MMT Right eval Left eval  Hip flexion    Hip extension    Hip abduction    Hip adduction    Hip internal rotation    Hip external rotation    Knee flexion    Knee extension    Ankle dorsiflexion    Ankle plantarflexion    Ankle inversion    Ankle eversion     (Blank rows = not tested)  LUMBAR SPECIAL TESTS:  Straight leg raise test: {pos/neg:25243} and Slump test: {pos/neg:25243}  FUNCTIONAL TESTS:  30 seconds chair stand test  GAIT: Distance walked: *** Assistive device utilized: {Assistive devices:23999} Level of assistance: {Levels of  assistance:24026} Comments: ***  TREATMENT:                                                                                                                          [facility] Adult PT Treatment:                                                DATE: 08/30/24  Self Care: Additional minutes spent for educating on updated Therapeutic Home Exercise Program as well as comparing current status to condition at start of symptoms. This included exercises focusing on stretching, strengthening, with focus on eccentric aspects. Long term goals include an improvement in range of motion, strength, endurance as well as avoiding reinjury. Patient's frequency would include in 1-2 times a day, 3-5 times a week for a duration of 6-12 weeks. Proper technique shown and discussed handout in great detail. All questions were discussed and  addressed.      PATIENT EDUCATION:  Education details: Discussed eval findings, rehab rationale and POC and patient is in agreement  Person educated: Patient Education method: Explanation and Handouts Education comprehension: verbalized understanding and needs further education  HOME EXERCISE PROGRAM: ***  ASSESSMENT:  CLINICAL IMPRESSION: Patient is a 33 y.o. male who was seen today for physical therapy evaluation and treatment for ***.   OBJECTIVE IMPAIRMENTS: {opptimpairments:25111}.   ACTIVITY LIMITATIONS: {activitylimitations:27494}  PERSONAL FACTORS: {Personal factors:25162} are also affecting patient's functional outcome.   REHAB POTENTIAL: Fair due to chronicity  CLINICAL DECISION MAKING: Stable/uncomplicated  EVALUATION COMPLEXITY: Moderate   GOALS: Goals reviewed with patient? No  SHORT TERM GOALS: Target date: ***  Patient to demonstrate independence in HEP  Baseline: Goal status: INITIAL  2.  *** Baseline:  Goal status: INITIAL  3.  *** Baseline:  Goal status: INITIAL  4.  *** Baseline:  Goal status: INITIAL  5.  *** Baseline:  Goal status:  INITIAL  6.  *** Baseline:  Goal status: INITIAL  LONG TERM GOALS: Target date: ***  Patient will score at least ***% on FOTO to signify clinically meaningful improvement in functional abilities.   Baseline:  Goal status: INITIAL  2.  Patient will acknowledge ***/10 pain at least once during episode of care   Baseline:  Goal status: INITIAL  3.  Patient will increase 30s chair stand reps from *** to *** with/without arms to demonstrate and improved functional ability with less pain/difficulty as well as reduce fall risk.  Baseline:  Goal status: INITIAL  4.  *** Baseline:  Goal status: INITIAL  5.  *** Baseline:  Goal status: INITIAL  6.  *** Baseline:  Goal status: INITIAL  PLAN:  PT FREQUENCY: 1-2x/week  PT DURATION: 6 weeks  PLANNED INTERVENTIONS: 97110-Therapeutic exercises, 97530- Therapeutic activity, [id number]- Neuromuscular re-education, 97535- Self Care, [id number]- Manual therapy, and Patient/Family education.  PLAN FOR NEXT SESSION: HEP review and update, manual techniques as appropriate, aerobic tasks, ROM and flexibility activities, strengthening and PREs, TPDN, gait and balance training,aquatic therapy, modalities for pain and NMRE      Keshauna Degraffenreid M Nikiyah Fackler, PT 08/29/2024, 1:53 PM

## 2024-08-30 ENCOUNTER — Ambulatory Visit: Admitting: Podiatry

## 2024-08-30 ENCOUNTER — Ambulatory Visit: Attending: Surgical

## 2024-08-31 ENCOUNTER — Other Ambulatory Visit: Payer: Self-pay | Admitting: Surgical

## 2024-09-11 ENCOUNTER — Ambulatory Visit: Admitting: Orthopedic Surgery

## 2024-10-23 ENCOUNTER — Encounter: Admitting: Orthopedic Surgery

## 2024-11-14 ENCOUNTER — Ambulatory Visit: Admitting: Orthopedic Surgery

## 2024-11-14 ENCOUNTER — Ambulatory Visit: Admitting: Podiatry

## 2024-11-14 ENCOUNTER — Other Ambulatory Visit: Payer: Self-pay

## 2024-11-14 VITALS — BP 162/101 | HR 66 | Ht 71.5 in | Wt 174.0 lb

## 2024-11-14 DIAGNOSIS — M545 Low back pain, unspecified: Secondary | ICD-10-CM | POA: Diagnosis not present

## 2024-11-14 NOTE — Progress Notes (Signed)
 Orthopedic Spine Surgery Office Note  Assessment: Patient is a 34 y.o. male with chronic low back pain.  No radicular symptoms.  Discogenic in nature.  He has a degenerative disc at L4/5   Plan: -Patient has tried tylenol , ibuprofen , PT, stretching -His pain is better with extension at the lumbar spine worse with bending, so it seems to be discogenic in nature.  I told him to continue with over-the-counter medications.  He could also use lidocaine  patches.  I discussed core strengthening as a part of his treatment.  Since he has no radiating leg pain, I do not think that Lyrica or gabapentin  provide more relief.  He does not describe muscle spasm type pain so I do not think a muscle relaxer would help him.  I told him about fusion and pain management as additional options.  Given his age and the risk for adjacent segment disease which I went over with him, I do not feel that surgery would be a great option.  I told him if his pain is refractory to core strengthening, trial of an inversion table, over-the-counter medications, lidocaine  patches, then it may be worth exploring pain management as an option -Would need to be nicotine  free prior to any elective spine surgery -Patient should return to office on an as needed basis   Patient expressed understanding of the plan and all questions were answered to the patient's satisfaction.   ___________________________________________________________________________   History:  Patient is a 34 y.o. male who presents today for lumbar spine.  Patient has had several years of low back pain.  He feels it in the lower lumbar region.  He has no pain radiating to either lower extremity.  There was no trauma or injury that preceded the onset of the pain.  He said the pain has been getting worse as time goes on.  Previously, he has been able to control it with over-the-counter medications but is becoming poorly controlled even with those medications.  He notes it is  worse if he has been doing or grabbing something outside floor.  He gets better if he arches his back or leans backwards.  He also feels it is better if he stretches it.   Weakness: denies Symptoms of imbalance: denies  Paresthesias and numbness: denies Bowel or bladder incontinence: denies  Saddle anesthesia: denies  Treatments tried: tylenol , ibuprofen , PT, stretching  Review of systems: Denies fevers and chills, night sweats, unexplained weight loss, history of cancer. Has had pain that wakes him at night  Past medical history: Herpes HIV HTN Bipolar ADHD Seizures Hemorrhoids  Allergies: tylenol , vicodin, dilaudid  Past surgical history:  Bilateral ankle surgery VATS I&D of extremity  Social history: Reports use of nicotine  product (smoking, vaping, patches, smokeless) Alcohol use: social Denies recreational drug use   Physical Exam:  BMI of 23.9  General: no acute distress, appears stated age Neurologic: alert, answering questions appropriately, following commands Respiratory: unlabored breathing on room air, symmetric chest rise Psychiatric: appropriate affect, normal cadence to speech   MSK (spine):  -Strength exam      Left  Right EHL    5/5  5/5 TA    5/5  5/5 GSC    5/5  5/5 Knee extension  5/5  5/5 Hip flexion   5/5  5/5  -Sensory exam    Sensation intact to light touch in L3-S1 nerve distributions of bilateral lower extremities  -Achilles DTR: 2/4 on the left, 2/4 on the right -Patellar tendon DTR: 2/4  on the left, 2/4 on the right  -Straight leg raise: negative bilaterally -Clonus: no beats bilaterally  -Left hip exam: No pain through range of motion, negative Stinchfield, negative SI joint compression test, negative FABER -Right hip exam: No pain through range of motion, negative Stinchfield, negative SI joint compression test, negative FABER  Imaging: XRs of the lumbar spine from 11/14/2024 were independently reviewed and  interpreted, showing disc height loss at L4/5. Small anterior osteophyte formation at L4/5. No evidence of instability on flexion/views.  No fracture or dislocation seen.  MRI of the lumbar spine from 04/24/2024 was independently reviewed and interpreted, showing DDD at L4/5.  No other degenerative disc seen.  No significant central, lateral recess, or foraminal stenosis seen.   Patient name: Pedro Owens Patient MRN: [id number] Date of visit: 11/14/24
